# Patient Record
Sex: Male | Born: 1960 | Race: Black or African American
Health system: Southern US, Community
[De-identification: ages and names within clinical notes are randomized; demographics above are authoritative.]

## PROBLEM LIST (undated history)

## (undated) DIAGNOSIS — F329 Major depressive disorder, single episode, unspecified: Secondary | ICD-10-CM

## (undated) DIAGNOSIS — F209 Schizophrenia, unspecified: Secondary | ICD-10-CM

## (undated) DIAGNOSIS — F4310 Post-traumatic stress disorder, unspecified: Secondary | ICD-10-CM

## (undated) DIAGNOSIS — F319 Bipolar disorder, unspecified: Secondary | ICD-10-CM

## (undated) DIAGNOSIS — M1990 Unspecified osteoarthritis, unspecified site: Secondary | ICD-10-CM

## (undated) DIAGNOSIS — N481 Balanitis: Secondary | ICD-10-CM

## (undated) DIAGNOSIS — F32A Depression, unspecified: Secondary | ICD-10-CM

## (undated) HISTORY — DX: Unspecified osteoarthritis, unspecified site: M19.90

## (undated) HISTORY — DX: Major depressive disorder, single episode, unspecified: F32.9

## (undated) HISTORY — DX: Depression, unspecified: F32.A

---

## 1998-11-09 ENCOUNTER — Emergency Department (HOSPITAL_COMMUNITY): Admission: EM | Admit: 1998-11-09 | Discharge: 1998-11-09 | Payer: Self-pay

## 1998-11-12 ENCOUNTER — Emergency Department (HOSPITAL_COMMUNITY): Admission: EM | Admit: 1998-11-12 | Discharge: 1998-11-12 | Payer: Self-pay | Admitting: Emergency Medicine

## 1999-02-13 ENCOUNTER — Emergency Department (HOSPITAL_COMMUNITY): Admission: EM | Admit: 1999-02-13 | Discharge: 1999-02-13 | Payer: Self-pay | Admitting: Emergency Medicine

## 1999-04-29 ENCOUNTER — Emergency Department (HOSPITAL_COMMUNITY): Admission: EM | Admit: 1999-04-29 | Discharge: 1999-04-29 | Payer: Self-pay | Admitting: Emergency Medicine

## 1999-05-09 ENCOUNTER — Emergency Department (HOSPITAL_COMMUNITY): Admission: EM | Admit: 1999-05-09 | Discharge: 1999-05-09 | Payer: Self-pay | Admitting: Emergency Medicine

## 1999-05-09 ENCOUNTER — Inpatient Hospital Stay (HOSPITAL_COMMUNITY): Admission: AD | Admit: 1999-05-09 | Discharge: 1999-05-24 | Payer: Self-pay | Admitting: *Deleted

## 1999-05-23 ENCOUNTER — Encounter: Payer: Self-pay | Admitting: *Deleted

## 1999-05-23 ENCOUNTER — Ambulatory Visit (HOSPITAL_COMMUNITY): Admission: RE | Admit: 1999-05-23 | Discharge: 1999-05-23 | Payer: Self-pay | Admitting: *Deleted

## 1999-10-12 ENCOUNTER — Inpatient Hospital Stay (HOSPITAL_COMMUNITY): Admission: EM | Admit: 1999-10-12 | Discharge: 1999-10-17 | Payer: Self-pay | Admitting: Psychiatry

## 1999-10-27 ENCOUNTER — Inpatient Hospital Stay (HOSPITAL_COMMUNITY): Admission: AD | Admit: 1999-10-27 | Discharge: 1999-11-05 | Payer: Self-pay | Admitting: *Deleted

## 1999-11-14 ENCOUNTER — Emergency Department (HOSPITAL_COMMUNITY): Admission: EM | Admit: 1999-11-14 | Discharge: 1999-11-14 | Payer: Self-pay | Admitting: Emergency Medicine

## 2000-01-09 ENCOUNTER — Emergency Department (HOSPITAL_COMMUNITY): Admission: EM | Admit: 2000-01-09 | Discharge: 2000-01-09 | Payer: Self-pay | Admitting: Emergency Medicine

## 2000-02-01 ENCOUNTER — Inpatient Hospital Stay (HOSPITAL_COMMUNITY): Admission: EM | Admit: 2000-02-01 | Discharge: 2000-02-06 | Payer: Self-pay | Admitting: *Deleted

## 2000-02-07 ENCOUNTER — Emergency Department (HOSPITAL_COMMUNITY): Admission: EM | Admit: 2000-02-07 | Discharge: 2000-02-07 | Payer: Self-pay | Admitting: Emergency Medicine

## 2000-02-12 ENCOUNTER — Emergency Department (HOSPITAL_COMMUNITY): Admission: EM | Admit: 2000-02-12 | Discharge: 2000-02-12 | Payer: Self-pay | Admitting: Emergency Medicine

## 2000-02-13 ENCOUNTER — Encounter: Payer: Self-pay | Admitting: Emergency Medicine

## 2000-02-27 ENCOUNTER — Inpatient Hospital Stay (HOSPITAL_COMMUNITY): Admission: EM | Admit: 2000-02-27 | Discharge: 2000-03-06 | Payer: Self-pay | Admitting: *Deleted

## 2000-09-06 ENCOUNTER — Inpatient Hospital Stay (HOSPITAL_COMMUNITY): Admission: EM | Admit: 2000-09-06 | Discharge: 2000-09-11 | Payer: Self-pay | Admitting: *Deleted

## 2000-11-12 ENCOUNTER — Emergency Department (HOSPITAL_COMMUNITY): Admission: EM | Admit: 2000-11-12 | Discharge: 2000-11-12 | Payer: Self-pay | Admitting: Emergency Medicine

## 2001-08-11 ENCOUNTER — Inpatient Hospital Stay (HOSPITAL_COMMUNITY): Admission: EM | Admit: 2001-08-11 | Discharge: 2001-08-12 | Payer: Self-pay | Admitting: Psychiatry

## 2001-09-29 ENCOUNTER — Inpatient Hospital Stay (HOSPITAL_COMMUNITY): Admission: EM | Admit: 2001-09-29 | Discharge: 2001-10-15 | Payer: Self-pay | Admitting: Psychiatry

## 2001-10-10 ENCOUNTER — Encounter: Payer: Self-pay | Admitting: Psychiatry

## 2001-10-24 ENCOUNTER — Emergency Department (HOSPITAL_COMMUNITY): Admission: EM | Admit: 2001-10-24 | Discharge: 2001-10-24 | Payer: Self-pay | Admitting: Emergency Medicine

## 2001-12-11 ENCOUNTER — Emergency Department (HOSPITAL_COMMUNITY): Admission: EM | Admit: 2001-12-11 | Discharge: 2001-12-11 | Payer: Self-pay | Admitting: Emergency Medicine

## 2001-12-29 ENCOUNTER — Emergency Department (HOSPITAL_COMMUNITY): Admission: EM | Admit: 2001-12-29 | Discharge: 2001-12-29 | Payer: Self-pay | Admitting: Emergency Medicine

## 2002-02-24 ENCOUNTER — Inpatient Hospital Stay (HOSPITAL_COMMUNITY): Admission: EM | Admit: 2002-02-24 | Discharge: 2002-03-07 | Payer: Self-pay | Admitting: Psychiatry

## 2002-02-24 ENCOUNTER — Encounter: Payer: Self-pay | Admitting: Emergency Medicine

## 2002-03-27 ENCOUNTER — Encounter: Payer: Self-pay | Admitting: Emergency Medicine

## 2002-03-27 ENCOUNTER — Inpatient Hospital Stay (HOSPITAL_COMMUNITY): Admission: EM | Admit: 2002-03-27 | Discharge: 2002-03-31 | Payer: Self-pay | Admitting: Emergency Medicine

## 2002-05-09 ENCOUNTER — Inpatient Hospital Stay (HOSPITAL_COMMUNITY): Admission: EM | Admit: 2002-05-09 | Discharge: 2002-05-17 | Payer: Self-pay | Admitting: Psychiatry

## 2002-05-09 ENCOUNTER — Encounter: Payer: Self-pay | Admitting: Emergency Medicine

## 2002-05-14 ENCOUNTER — Encounter: Payer: Self-pay | Admitting: Psychiatry

## 2002-05-19 ENCOUNTER — Emergency Department (HOSPITAL_COMMUNITY): Admission: EM | Admit: 2002-05-19 | Discharge: 2002-05-19 | Payer: Self-pay | Admitting: Emergency Medicine

## 2002-06-25 ENCOUNTER — Inpatient Hospital Stay (HOSPITAL_COMMUNITY): Admission: AD | Admit: 2002-06-25 | Discharge: 2002-06-30 | Payer: Self-pay | Admitting: Psychiatry

## 2002-07-27 ENCOUNTER — Inpatient Hospital Stay (HOSPITAL_COMMUNITY): Admission: EM | Admit: 2002-07-27 | Discharge: 2002-08-09 | Payer: Self-pay | Admitting: *Deleted

## 2002-11-11 ENCOUNTER — Encounter: Payer: Self-pay | Admitting: *Deleted

## 2002-11-11 ENCOUNTER — Emergency Department (HOSPITAL_COMMUNITY): Admission: AD | Admit: 2002-11-11 | Discharge: 2002-11-11 | Payer: Self-pay | Admitting: Emergency Medicine

## 2002-11-27 ENCOUNTER — Inpatient Hospital Stay (HOSPITAL_COMMUNITY): Admission: EM | Admit: 2002-11-27 | Discharge: 2002-12-07 | Payer: Self-pay | Admitting: Psychiatry

## 2003-03-29 ENCOUNTER — Emergency Department (HOSPITAL_COMMUNITY): Admission: EM | Admit: 2003-03-29 | Discharge: 2003-03-29 | Payer: Self-pay | Admitting: *Deleted

## 2003-05-17 ENCOUNTER — Emergency Department (HOSPITAL_COMMUNITY): Admission: AD | Admit: 2003-05-17 | Discharge: 2003-05-17 | Payer: Self-pay | Admitting: Family Medicine

## 2003-06-09 ENCOUNTER — Inpatient Hospital Stay (HOSPITAL_COMMUNITY): Admission: AD | Admit: 2003-06-09 | Discharge: 2003-06-20 | Payer: Self-pay | Admitting: Psychiatry

## 2003-10-21 ENCOUNTER — Inpatient Hospital Stay (HOSPITAL_COMMUNITY): Admission: RE | Admit: 2003-10-21 | Discharge: 2003-10-25 | Payer: Self-pay | Admitting: Psychiatry

## 2004-02-07 ENCOUNTER — Ambulatory Visit: Payer: Self-pay | Admitting: Psychiatry

## 2004-02-07 ENCOUNTER — Inpatient Hospital Stay (HOSPITAL_COMMUNITY): Admission: RE | Admit: 2004-02-07 | Discharge: 2004-02-12 | Payer: Self-pay | Admitting: Psychiatry

## 2004-02-29 ENCOUNTER — Emergency Department (HOSPITAL_COMMUNITY): Admission: EM | Admit: 2004-02-29 | Discharge: 2004-02-29 | Payer: Self-pay | Admitting: Emergency Medicine

## 2004-03-28 ENCOUNTER — Inpatient Hospital Stay (HOSPITAL_COMMUNITY): Admission: RE | Admit: 2004-03-28 | Discharge: 2004-04-01 | Payer: Self-pay | Admitting: Psychiatry

## 2004-03-28 ENCOUNTER — Ambulatory Visit: Payer: Self-pay | Admitting: Psychiatry

## 2004-06-03 ENCOUNTER — Inpatient Hospital Stay (HOSPITAL_COMMUNITY): Admission: RE | Admit: 2004-06-03 | Discharge: 2004-06-12 | Payer: Self-pay | Admitting: Psychiatry

## 2004-06-03 ENCOUNTER — Ambulatory Visit: Payer: Self-pay | Admitting: Psychiatry

## 2004-08-07 ENCOUNTER — Inpatient Hospital Stay (HOSPITAL_COMMUNITY): Admission: RE | Admit: 2004-08-07 | Discharge: 2004-08-13 | Payer: Self-pay | Admitting: Psychiatry

## 2004-08-07 ENCOUNTER — Ambulatory Visit: Payer: Self-pay | Admitting: Psychiatry

## 2004-11-19 ENCOUNTER — Emergency Department (HOSPITAL_COMMUNITY): Admission: EM | Admit: 2004-11-19 | Discharge: 2004-11-19 | Payer: Self-pay | Admitting: Emergency Medicine

## 2004-12-11 ENCOUNTER — Inpatient Hospital Stay: Payer: Self-pay | Admitting: Psychiatry

## 2004-12-27 ENCOUNTER — Emergency Department: Payer: Self-pay | Admitting: Emergency Medicine

## 2005-02-27 ENCOUNTER — Inpatient Hospital Stay (HOSPITAL_COMMUNITY): Admission: RE | Admit: 2005-02-27 | Discharge: 2005-03-05 | Payer: Self-pay | Admitting: Psychiatry

## 2005-02-27 ENCOUNTER — Encounter: Payer: Self-pay | Admitting: Emergency Medicine

## 2005-02-27 ENCOUNTER — Ambulatory Visit: Payer: Self-pay | Admitting: Psychiatry

## 2005-07-26 ENCOUNTER — Inpatient Hospital Stay (HOSPITAL_COMMUNITY): Admission: RE | Admit: 2005-07-26 | Discharge: 2005-07-29 | Payer: Self-pay | Admitting: Psychiatry

## 2005-07-27 ENCOUNTER — Ambulatory Visit: Payer: Self-pay | Admitting: Psychiatry

## 2005-10-26 ENCOUNTER — Emergency Department (HOSPITAL_COMMUNITY): Admission: EM | Admit: 2005-10-26 | Discharge: 2005-10-27 | Payer: Self-pay | Admitting: Emergency Medicine

## 2006-05-07 ENCOUNTER — Emergency Department (HOSPITAL_COMMUNITY): Admission: EM | Admit: 2006-05-07 | Discharge: 2006-05-07 | Payer: Self-pay | Admitting: Emergency Medicine

## 2006-07-12 ENCOUNTER — Emergency Department (HOSPITAL_COMMUNITY): Admission: EM | Admit: 2006-07-12 | Discharge: 2006-07-12 | Payer: Self-pay | Admitting: Emergency Medicine

## 2006-07-25 ENCOUNTER — Emergency Department (HOSPITAL_COMMUNITY): Admission: EM | Admit: 2006-07-25 | Discharge: 2006-07-25 | Payer: Self-pay | Admitting: Emergency Medicine

## 2006-11-28 ENCOUNTER — Ambulatory Visit: Payer: Self-pay | Admitting: *Deleted

## 2006-11-28 ENCOUNTER — Inpatient Hospital Stay (HOSPITAL_COMMUNITY): Admission: AD | Admit: 2006-11-28 | Discharge: 2006-12-04 | Payer: Self-pay | Admitting: *Deleted

## 2007-03-28 ENCOUNTER — Inpatient Hospital Stay (HOSPITAL_COMMUNITY): Admission: AD | Admit: 2007-03-28 | Discharge: 2007-04-06 | Payer: Self-pay | Admitting: Psychiatry

## 2007-03-28 ENCOUNTER — Ambulatory Visit: Payer: Self-pay | Admitting: Psychiatry

## 2007-05-01 ENCOUNTER — Inpatient Hospital Stay (HOSPITAL_COMMUNITY): Admission: AD | Admit: 2007-05-01 | Discharge: 2007-05-10 | Payer: Self-pay | Admitting: Psychiatry

## 2007-07-27 ENCOUNTER — Emergency Department (HOSPITAL_COMMUNITY): Admission: EM | Admit: 2007-07-27 | Discharge: 2007-07-27 | Payer: Self-pay | Admitting: Emergency Medicine

## 2007-09-15 ENCOUNTER — Ambulatory Visit: Payer: Self-pay | Admitting: *Deleted

## 2007-09-15 ENCOUNTER — Inpatient Hospital Stay (HOSPITAL_COMMUNITY): Admission: AD | Admit: 2007-09-15 | Discharge: 2007-09-20 | Payer: Self-pay | Admitting: *Deleted

## 2007-09-15 ENCOUNTER — Other Ambulatory Visit: Payer: Self-pay | Admitting: Emergency Medicine

## 2007-09-25 ENCOUNTER — Other Ambulatory Visit: Payer: Self-pay | Admitting: Emergency Medicine

## 2007-09-25 ENCOUNTER — Inpatient Hospital Stay (HOSPITAL_COMMUNITY): Admission: AD | Admit: 2007-09-25 | Discharge: 2007-10-01 | Payer: Self-pay | Admitting: Psychiatry

## 2008-01-05 ENCOUNTER — Inpatient Hospital Stay (HOSPITAL_COMMUNITY): Admission: RE | Admit: 2008-01-05 | Discharge: 2008-01-13 | Payer: Self-pay | Admitting: Psychiatry

## 2008-01-05 ENCOUNTER — Ambulatory Visit: Payer: Self-pay | Admitting: Psychiatry

## 2008-01-05 ENCOUNTER — Encounter: Payer: Self-pay | Admitting: Emergency Medicine

## 2008-01-26 ENCOUNTER — Other Ambulatory Visit: Payer: Self-pay | Admitting: Emergency Medicine

## 2008-01-26 ENCOUNTER — Other Ambulatory Visit: Payer: Self-pay

## 2008-01-27 ENCOUNTER — Inpatient Hospital Stay (HOSPITAL_COMMUNITY): Admission: AD | Admit: 2008-01-27 | Discharge: 2008-02-02 | Payer: Self-pay | Admitting: Psychiatry

## 2008-02-05 ENCOUNTER — Other Ambulatory Visit: Payer: Self-pay | Admitting: Emergency Medicine

## 2008-02-05 ENCOUNTER — Inpatient Hospital Stay (HOSPITAL_COMMUNITY): Admission: RE | Admit: 2008-02-05 | Discharge: 2008-02-18 | Payer: Self-pay | Admitting: Psychiatry

## 2008-02-05 ENCOUNTER — Ambulatory Visit: Payer: Self-pay | Admitting: Psychiatry

## 2008-03-02 ENCOUNTER — Other Ambulatory Visit: Payer: Self-pay | Admitting: Emergency Medicine

## 2008-03-03 ENCOUNTER — Inpatient Hospital Stay (HOSPITAL_COMMUNITY): Admission: AD | Admit: 2008-03-03 | Discharge: 2008-03-10 | Payer: Self-pay | Admitting: Psychiatry

## 2008-03-26 ENCOUNTER — Emergency Department (HOSPITAL_COMMUNITY): Admission: EM | Admit: 2008-03-26 | Discharge: 2008-03-26 | Payer: Self-pay | Admitting: Emergency Medicine

## 2008-09-14 ENCOUNTER — Other Ambulatory Visit: Payer: Self-pay | Admitting: Emergency Medicine

## 2008-09-15 ENCOUNTER — Inpatient Hospital Stay (HOSPITAL_COMMUNITY): Admission: RE | Admit: 2008-09-15 | Discharge: 2008-09-22 | Payer: Self-pay | Admitting: *Deleted

## 2008-09-15 ENCOUNTER — Ambulatory Visit: Payer: Self-pay | Admitting: *Deleted

## 2008-10-08 ENCOUNTER — Other Ambulatory Visit: Payer: Self-pay | Admitting: Emergency Medicine

## 2008-10-09 ENCOUNTER — Inpatient Hospital Stay (HOSPITAL_COMMUNITY): Admission: AD | Admit: 2008-10-09 | Discharge: 2008-10-13 | Payer: Self-pay | Admitting: Psychiatry

## 2008-10-13 ENCOUNTER — Other Ambulatory Visit: Payer: Self-pay | Admitting: Emergency Medicine

## 2008-10-14 ENCOUNTER — Other Ambulatory Visit: Payer: Self-pay | Admitting: Emergency Medicine

## 2008-10-14 ENCOUNTER — Inpatient Hospital Stay (HOSPITAL_COMMUNITY): Admission: EM | Admit: 2008-10-14 | Discharge: 2008-10-24 | Payer: Self-pay | Admitting: Psychiatry

## 2008-10-14 ENCOUNTER — Ambulatory Visit: Payer: Self-pay | Admitting: Psychiatry

## 2008-11-13 ENCOUNTER — Other Ambulatory Visit: Payer: Self-pay | Admitting: Emergency Medicine

## 2008-11-13 ENCOUNTER — Inpatient Hospital Stay (HOSPITAL_COMMUNITY): Admission: AD | Admit: 2008-11-13 | Discharge: 2008-11-17 | Payer: Self-pay | Admitting: Psychiatry

## 2008-11-25 ENCOUNTER — Other Ambulatory Visit: Payer: Self-pay | Admitting: Emergency Medicine

## 2008-11-25 ENCOUNTER — Ambulatory Visit: Payer: Self-pay | Admitting: *Deleted

## 2008-11-25 ENCOUNTER — Inpatient Hospital Stay (HOSPITAL_COMMUNITY): Admission: AD | Admit: 2008-11-25 | Discharge: 2008-12-01 | Payer: Self-pay | Admitting: *Deleted

## 2008-12-06 ENCOUNTER — Inpatient Hospital Stay: Payer: Self-pay | Admitting: Psychiatry

## 2008-12-26 IMAGING — CR DG NASAL BONES 3+V
5 series · 5 of 5 positions shown · non-contrast
Comparison: No priors

CLINICAL DATA: Fell - nasal pain

 NASAL BONES - 3+ VIEW

[w waters *]
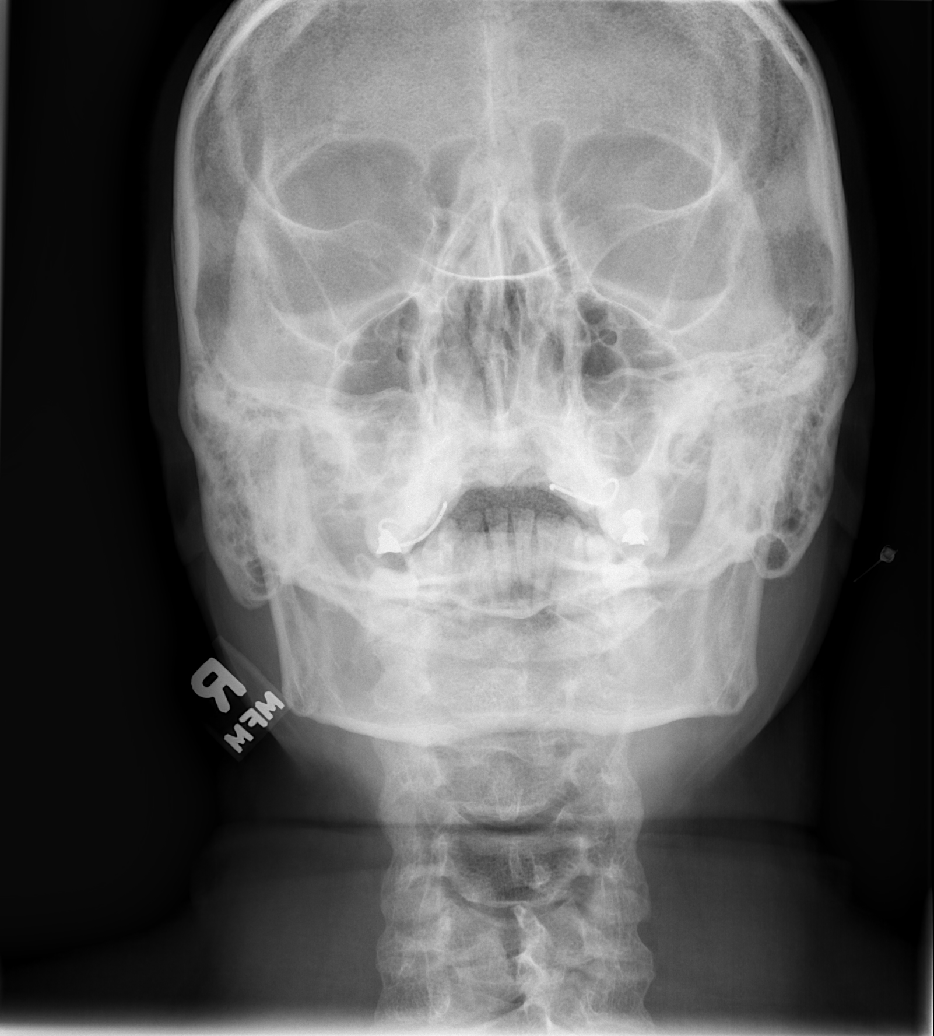

[w waters]
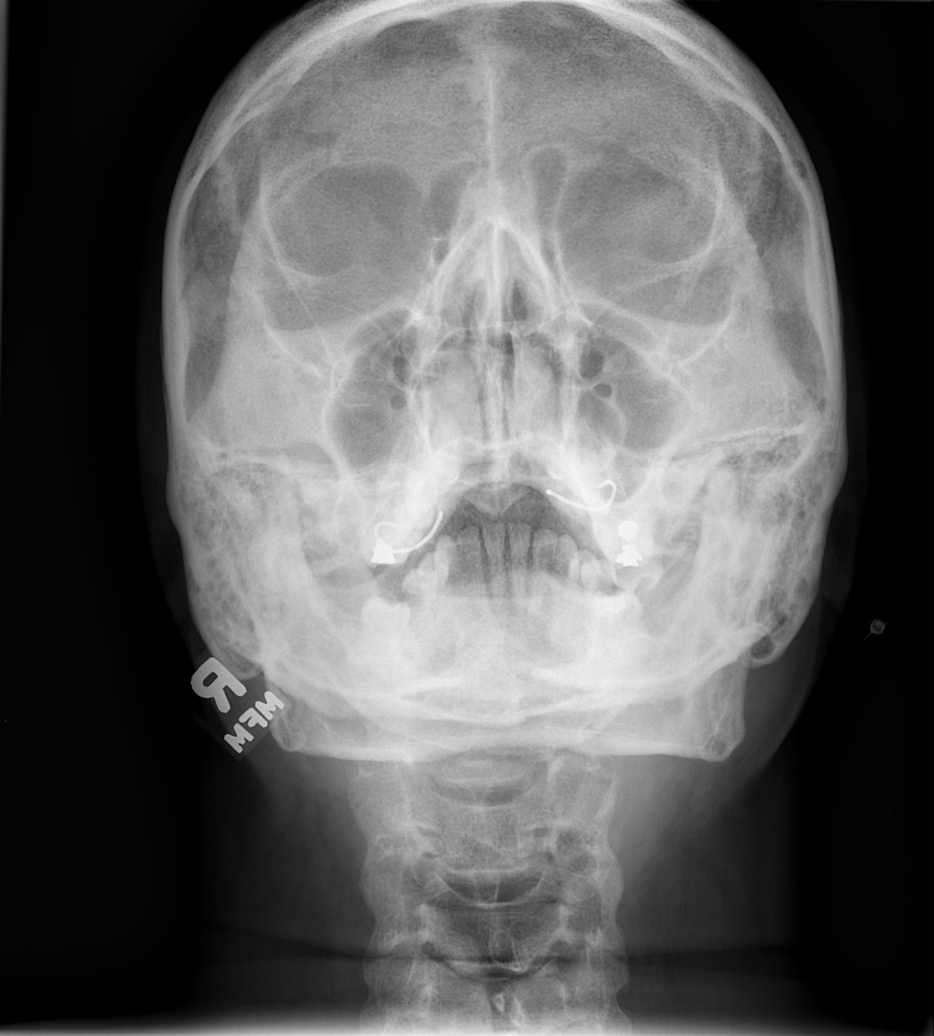

[w skull lat (1 of 3)]
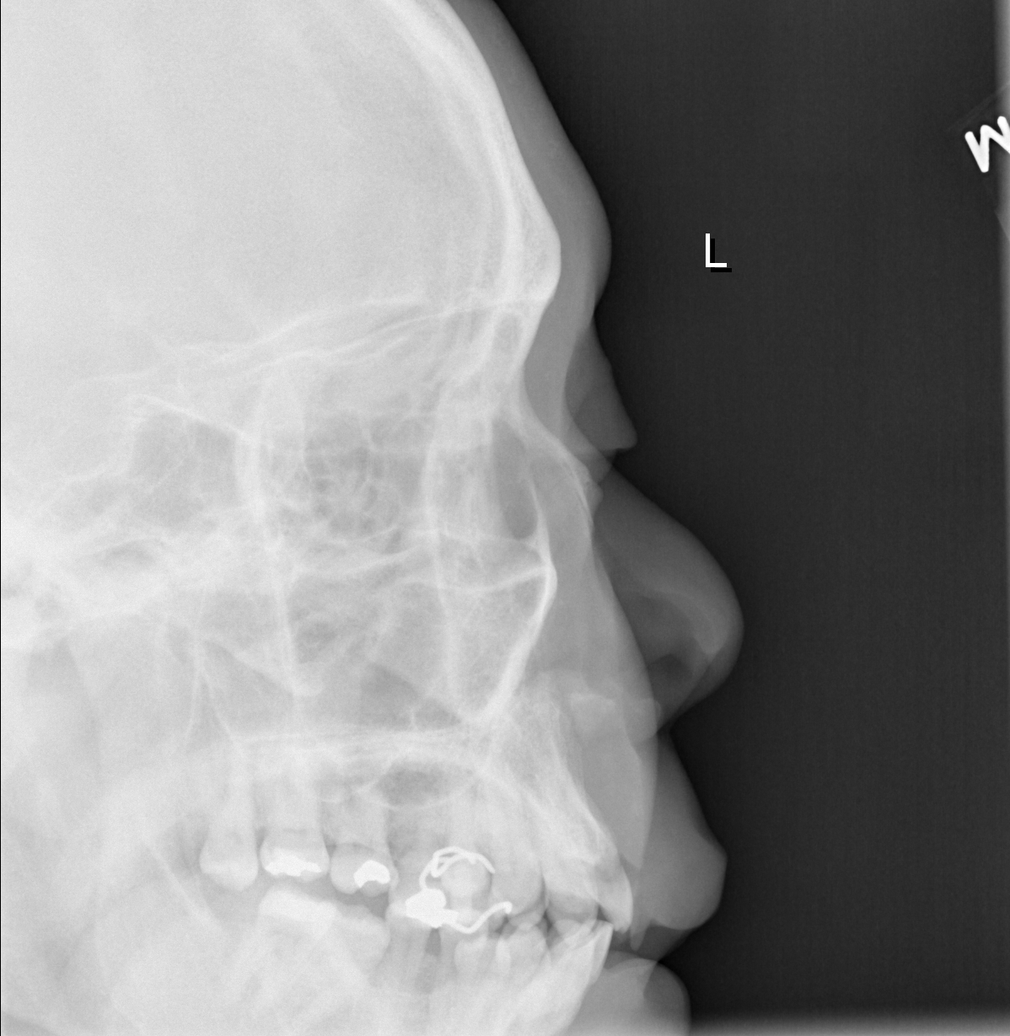

[w skull lat (2 of 3)]
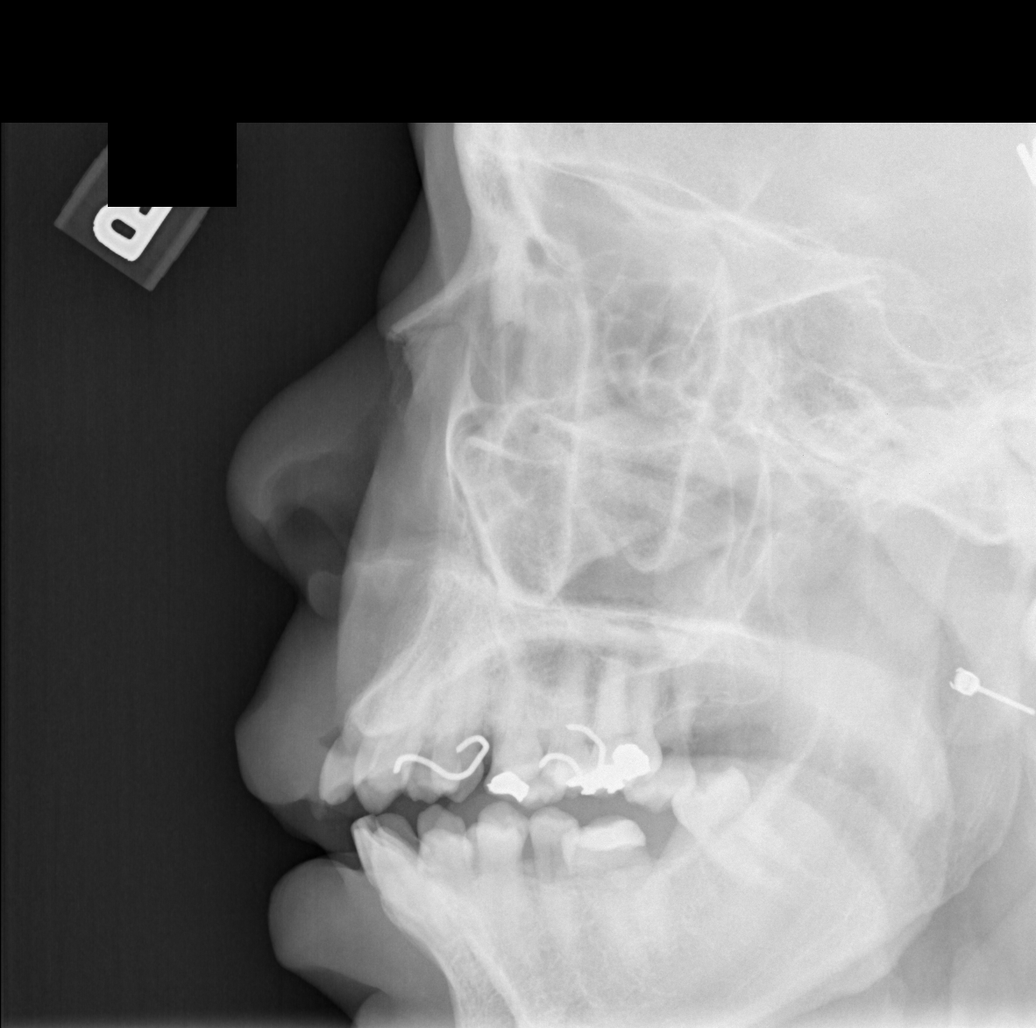

[w skull lat (3 of 3)]
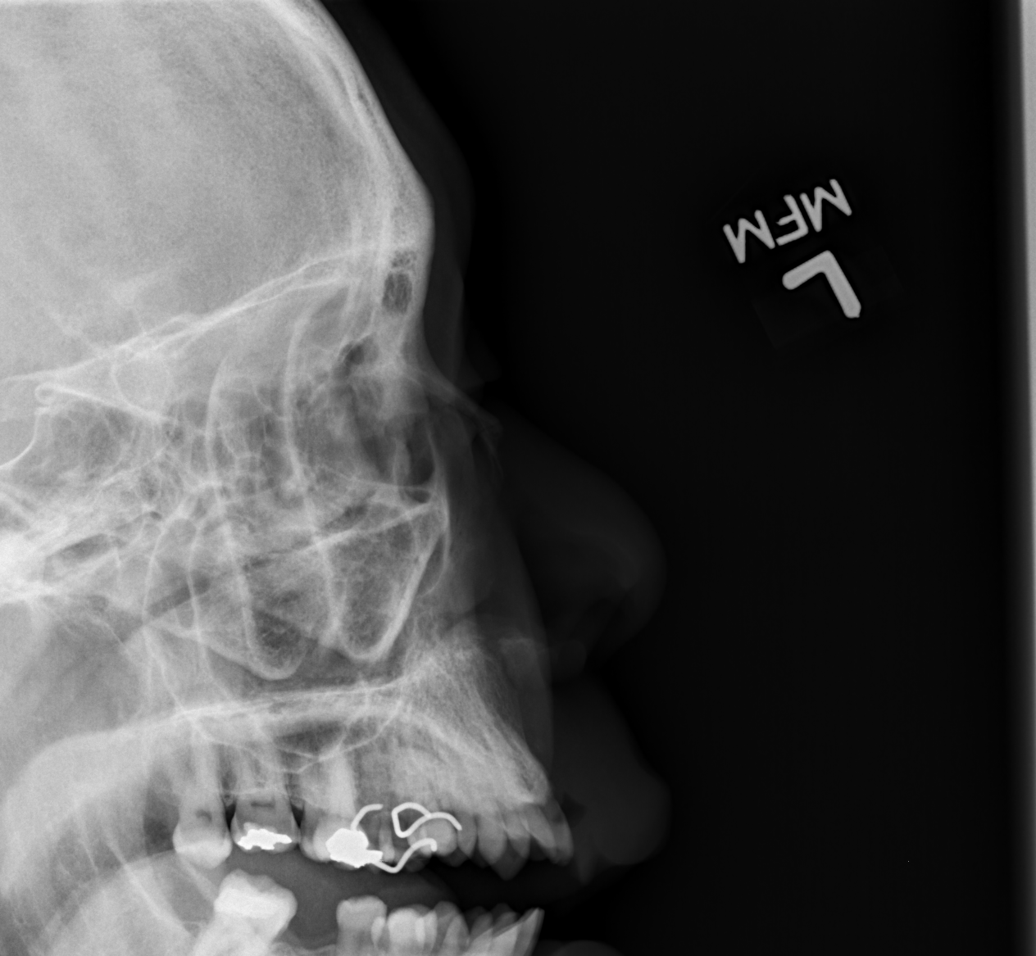

[5 of 5 positions shown; findings below may reference images not displayed]

FINDINGS: I cannot exclude an occult fracture of the distal nasal
bone.  This may be an artifact.  However the distal nasal bone
appears to be mildly displaced.  This could be due to an old
injury. No obvious fractures otherwise.  No fluid in the sinuses.
IMPRESSION: Cannot exclude a fracture of the distal nasal bone, but this may
not be acute.  See report.

## 2008-12-30 ENCOUNTER — Other Ambulatory Visit: Payer: Self-pay

## 2009-01-01 ENCOUNTER — Ambulatory Visit: Payer: Self-pay | Admitting: Psychiatry

## 2009-01-01 ENCOUNTER — Inpatient Hospital Stay (HOSPITAL_COMMUNITY): Admission: AD | Admit: 2009-01-01 | Discharge: 2009-01-05 | Payer: Self-pay | Admitting: Psychiatry

## 2009-01-07 ENCOUNTER — Other Ambulatory Visit: Payer: Self-pay

## 2009-01-07 ENCOUNTER — Other Ambulatory Visit: Payer: Self-pay | Admitting: Emergency Medicine

## 2009-01-08 ENCOUNTER — Emergency Department (HOSPITAL_COMMUNITY): Admission: EM | Admit: 2009-01-08 | Discharge: 2009-01-12 | Payer: Self-pay | Admitting: Emergency Medicine

## 2009-02-17 ENCOUNTER — Other Ambulatory Visit: Payer: Self-pay | Admitting: Emergency Medicine

## 2009-02-19 ENCOUNTER — Inpatient Hospital Stay (HOSPITAL_COMMUNITY): Admission: AD | Admit: 2009-02-19 | Discharge: 2009-02-23 | Payer: Self-pay | Admitting: Psychiatry

## 2009-02-19 ENCOUNTER — Ambulatory Visit: Payer: Self-pay | Admitting: Psychiatry

## 2009-03-03 ENCOUNTER — Other Ambulatory Visit: Payer: Self-pay

## 2009-03-03 ENCOUNTER — Emergency Department (HOSPITAL_COMMUNITY): Admission: EM | Admit: 2009-03-03 | Discharge: 2009-03-09 | Payer: Self-pay | Admitting: Emergency Medicine

## 2009-03-30 ENCOUNTER — Other Ambulatory Visit: Payer: Self-pay | Admitting: Emergency Medicine

## 2009-03-31 ENCOUNTER — Emergency Department (HOSPITAL_COMMUNITY): Admission: EM | Admit: 2009-03-31 | Discharge: 2009-04-09 | Payer: Self-pay | Admitting: Emergency Medicine

## 2010-03-15 ENCOUNTER — Other Ambulatory Visit: Payer: Self-pay | Admitting: Emergency Medicine

## 2010-03-18 ENCOUNTER — Ambulatory Visit: Payer: Self-pay | Admitting: Psychiatry

## 2010-03-18 ENCOUNTER — Inpatient Hospital Stay (HOSPITAL_COMMUNITY): Admission: AD | Admit: 2010-03-18 | Discharge: 2010-03-29 | Payer: Self-pay | Admitting: Psychiatry

## 2010-04-11 ENCOUNTER — Other Ambulatory Visit: Payer: Self-pay | Admitting: Emergency Medicine

## 2010-04-12 ENCOUNTER — Other Ambulatory Visit: Payer: Self-pay | Admitting: Emergency Medicine

## 2010-04-13 DIAGNOSIS — F259 Schizoaffective disorder, unspecified: Secondary | ICD-10-CM

## 2010-04-15 ENCOUNTER — Other Ambulatory Visit: Payer: Self-pay | Admitting: Emergency Medicine

## 2010-04-17 ENCOUNTER — Inpatient Hospital Stay (HOSPITAL_COMMUNITY): Admission: EM | Admit: 2010-04-17 | Discharge: 2010-04-24 | Payer: Self-pay | Admitting: Psychiatry

## 2010-04-17 DIAGNOSIS — F259 Schizoaffective disorder, unspecified: Secondary | ICD-10-CM

## 2010-05-31 ENCOUNTER — Emergency Department (HOSPITAL_COMMUNITY)
Admission: EM | Admit: 2010-05-31 | Discharge: 2010-05-31 | Disposition: A | Discharge status: Other Acute Inpatient Hospital | Payer: Self-pay | Source: Home / Self Care | Admitting: Emergency Medicine

## 2010-05-31 ENCOUNTER — Inpatient Hospital Stay (HOSPITAL_COMMUNITY)
Admission: AD | Admit: 2010-05-31 | Discharge: 2010-06-12 | Payer: Self-pay | Source: Home / Self Care | Attending: Psychiatry | Admitting: Psychiatry

## 2010-05-31 LAB — CBC
HCT: 45.8 % (ref 39.0–52.0)
Hemoglobin: 15 g/dL (ref 13.0–17.0)
MCH: 30.3 pg (ref 26.0–34.0)
MCHC: 32.8 g/dL (ref 30.0–36.0)
MCV: 92.5 fL (ref 78.0–100.0)
Platelets: 221 10*3/uL (ref 150–400)
RBC: 4.95 MIL/uL (ref 4.22–5.81)
RDW: 13 % (ref 11.5–15.5)
WBC: 4.4 10*3/uL (ref 4.0–10.5)

## 2010-05-31 LAB — DIFFERENTIAL
Basophils Absolute: 0 10*3/uL (ref 0.0–0.1)
Basophils Relative: 0 % (ref 0–1)
Eosinophils Absolute: 0.3 10*3/uL (ref 0.0–0.7)
Eosinophils Relative: 8 % — ABNORMAL HIGH (ref 0–5)
Lymphocytes Relative: 23 % (ref 12–46)
Lymphs Abs: 1 10*3/uL (ref 0.7–4.0)
Monocytes Absolute: 0.4 10*3/uL (ref 0.1–1.0)
Monocytes Relative: 10 % (ref 3–12)
Neutro Abs: 2.6 10*3/uL (ref 1.7–7.7)
Neutrophils Relative %: 59 % (ref 43–77)

## 2010-05-31 LAB — BASIC METABOLIC PANEL
BUN: 18 mg/dL (ref 6–23)
CO2: 27 mEq/L (ref 19–32)
Calcium: 9.1 mg/dL (ref 8.4–10.5)
Chloride: 105 mEq/L (ref 96–112)
Creatinine, Ser: 1.07 mg/dL (ref 0.4–1.5)
GFR calc Af Amer: >60 mL/min (ref >60)
GFR calc non Af Amer: >60 mL/min (ref >60)
Glucose, Bld: 89 mg/dL (ref 70–99)
Potassium: 3.9 mEq/L (ref 3.5–5.1)
Sodium: 141 mEq/L (ref 135–145)

## 2010-05-31 LAB — RAPID URINE DRUG SCREEN, HOSP PERFORMED
Amphetamines: NOT DETECTED
Barbiturates: NOT DETECTED
Benzodiazepines: NOT DETECTED
Cocaine: POSITIVE — AB
Opiates: NOT DETECTED
Tetrahydrocannabinol: NOT DETECTED

## 2010-05-31 LAB — LITHIUM LEVEL: Lithium Lvl: <0.25 mEq/L — ABNORMAL LOW (ref 0.80–1.40)

## 2010-05-31 LAB — ETHANOL: Alcohol, Ethyl (B): <5 mg/dL (ref 0–10)

## 2010-05-31 LAB — SALICYLATE LEVEL: Salicylate Lvl: <4 mg/dL (ref 2.8–20.0)

## 2010-05-31 LAB — ACETAMINOPHEN LEVEL: Acetaminophen (Tylenol), Serum: <10 ug/mL — ABNORMAL LOW (ref 10–30)

## 2010-06-10 LAB — LITHIUM LEVEL: Lithium Lvl: <0.25 mEq/L — ABNORMAL LOW (ref 0.80–1.40)
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
## 2010-08-06 LAB — DIFFERENTIAL
Basophils Absolute: 0 10*3/uL (ref 0.0–0.1)
Basophils Relative: 1 % (ref 0–1)
Eosinophils Absolute: 0.3 10*3/uL (ref 0.0–0.7)
Eosinophils Relative: 8 % — ABNORMAL HIGH (ref 0–5)
Lymphocytes Relative: 17 % (ref 12–46)
Lymphs Abs: 0.7 10*3/uL (ref 0.7–4.0)
Monocytes Absolute: 0.4 10*3/uL (ref 0.1–1.0)
Monocytes Relative: 9 % (ref 3–12)
Neutro Abs: 2.7 10*3/uL (ref 1.7–7.7)
Neutrophils Relative %: 66 % (ref 43–77)

## 2010-08-06 LAB — BASIC METABOLIC PANEL
BUN: 11 mg/dL (ref 6–23)
CO2: 28 mEq/L (ref 19–32)
Calcium: 9.2 mg/dL (ref 8.4–10.5)
Chloride: 105 mEq/L (ref 96–112)
Creatinine, Ser: 1.04 mg/dL (ref 0.4–1.5)
GFR calc Af Amer: >60 mL/min (ref >60)
GFR calc non Af Amer: >60 mL/min (ref >60)
Glucose, Bld: 101 mg/dL — ABNORMAL HIGH (ref 70–99)
Potassium: 3.6 mEq/L (ref 3.5–5.1)
Sodium: 143 mEq/L (ref 135–145)

## 2010-08-06 LAB — CBC
HCT: 43.8 % (ref 39.0–52.0)
Hemoglobin: 14.6 g/dL (ref 13.0–17.0)
MCH: 30 pg (ref 26.0–34.0)
MCHC: 33.4 g/dL (ref 30.0–36.0)
MCV: 90 fL (ref 78.0–100.0)
Platelets: 156 10*3/uL (ref 150–400)
RBC: 4.86 MIL/uL (ref 4.22–5.81)
RDW: 13.9 % (ref 11.5–15.5)
WBC: 4.1 10*3/uL (ref 4.0–10.5)

## 2010-08-06 LAB — ETHANOL: Alcohol, Ethyl (B): <5 mg/dL (ref 0–10)

## 2010-08-06 LAB — RAPID URINE DRUG SCREEN, HOSP PERFORMED
Amphetamines: NOT DETECTED
Barbiturates: NOT DETECTED
Benzodiazepines: NOT DETECTED
Cocaine: POSITIVE — AB
Opiates: NOT DETECTED
Tetrahydrocannabinol: NOT DETECTED

## 2010-08-06 LAB — TRICYCLICS SCREEN, URINE: TCA Scrn: NOT DETECTED

## 2010-08-07 LAB — BASIC METABOLIC PANEL
CO2: 29 mEq/L (ref 19–32)
Calcium: 9.2 mg/dL (ref 8.4–10.5)
Chloride: 106 mEq/L (ref 96–112)
Creatinine, Ser: 1.05 mg/dL (ref 0.4–1.5)
GFR calc Af Amer: >60 mL/min (ref >60)
GFR calc Af Amer: >60 mL/min (ref >60)
GFR calc non Af Amer: >60 mL/min (ref >60)
GFR calc non Af Amer: >60 mL/min (ref >60)
Glucose, Bld: 84 mg/dL (ref 70–99)
Potassium: 3.9 mEq/L (ref 3.5–5.1)
Sodium: 138 mEq/L (ref 135–145)
Sodium: 141 mEq/L (ref 135–145)

## 2010-08-07 LAB — TSH
TSH: 1.995 u[IU]/mL (ref 0.350–4.500)
TSH: 2.365 u[IU]/mL (ref 0.350–4.500)

## 2010-08-07 LAB — LITHIUM LEVEL
Lithium Lvl: 0.29 mEq/L — ABNORMAL LOW (ref 0.80–1.40)
Lithium Lvl: 0.54 mEq/L — ABNORMAL LOW (ref 0.80–1.40)
Lithium Lvl: 0.58 mEq/L — ABNORMAL LOW (ref 0.80–1.40)
Lithium Lvl: <0.25 mEq/L — ABNORMAL LOW (ref 0.80–1.40)

## 2010-08-07 LAB — RAPID URINE DRUG SCREEN, HOSP PERFORMED
Amphetamines: NOT DETECTED
Cocaine: NOT DETECTED
Tetrahydrocannabinol: NOT DETECTED

## 2010-08-07 LAB — TRICYCLICS SCREEN, URINE: TCA Scrn: NOT DETECTED

## 2010-08-07 LAB — DIFFERENTIAL
Basophils Absolute: 0 10*3/uL (ref 0.0–0.1)
Basophils Relative: 1 % (ref 0–1)
Eosinophils Absolute: 0.6 10*3/uL (ref 0.0–0.7)
Monocytes Relative: 8 % (ref 3–12)
Neutro Abs: 3.3 10*3/uL (ref 1.7–7.7)
Neutrophils Relative %: 60 % (ref 43–77)

## 2010-08-07 LAB — HEPATIC FUNCTION PANEL
Albumin: 3.9 g/dL (ref 3.5–5.2)
Alkaline Phosphatase: 62 U/L (ref 39–117)
Indirect Bilirubin: 0.6 mg/dL (ref 0.3–0.9)
Total Protein: 7.6 g/dL (ref 6.0–8.3)

## 2010-08-07 LAB — SALICYLATE LEVEL: Salicylate Lvl: <4 mg/dL (ref 2.8–20.0)

## 2010-08-07 LAB — CBC
Hemoglobin: 14.2 g/dL (ref 13.0–17.0)
MCH: 29.2 pg (ref 26.0–34.0)
MCHC: 32.6 g/dL (ref 30.0–36.0)
Platelets: 205 10*3/uL (ref 150–400)

## 2010-08-07 LAB — ACETAMINOPHEN LEVEL: Acetaminophen (Tylenol), Serum: <10 ug/mL — ABNORMAL LOW (ref 10–30)

## 2010-08-09 ENCOUNTER — Emergency Department (HOSPITAL_COMMUNITY)
Admission: EM | Admit: 2010-08-09 | Discharge: 2010-08-10 | Disposition: A | Discharge status: Other Acute Inpatient Hospital | Payer: Medicaid Other | Source: Home / Self Care | Attending: Emergency Medicine | Admitting: Emergency Medicine

## 2010-08-09 DIAGNOSIS — F319 Bipolar disorder, unspecified: Secondary | ICD-10-CM | POA: Insufficient documentation

## 2010-08-09 DIAGNOSIS — Z79899 Other long term (current) drug therapy: Secondary | ICD-10-CM | POA: Insufficient documentation

## 2010-08-09 DIAGNOSIS — Z046 Encounter for general psychiatric examination, requested by authority: Secondary | ICD-10-CM | POA: Insufficient documentation

## 2010-08-09 LAB — DIFFERENTIAL
Basophils Absolute: 0 10*3/uL (ref 0.0–0.1)
Basophils Relative: 1 % (ref 0–1)
Eosinophils Absolute: 0.4 10*3/uL (ref 0.0–0.7)
Monocytes Relative: 8 % (ref 3–12)
Neutro Abs: 2.1 10*3/uL (ref 1.7–7.7)
Neutrophils Relative %: 57 % (ref 43–77)

## 2010-08-09 LAB — ETHANOL: Alcohol, Ethyl (B): <5 mg/dL (ref 0–10)

## 2010-08-09 LAB — COMPREHENSIVE METABOLIC PANEL
ALT: 24 U/L (ref 0–53)
Alkaline Phosphatase: 74 U/L (ref 39–117)
BUN: 13 mg/dL (ref 6–23)
CO2: 27 mEq/L (ref 19–32)
Calcium: 9.4 mg/dL (ref 8.4–10.5)
GFR calc non Af Amer: >60 mL/min (ref >60)
Glucose, Bld: 99 mg/dL (ref 70–99)
Potassium: 4.2 mEq/L (ref 3.5–5.1)
Sodium: 140 mEq/L (ref 135–145)

## 2010-08-09 LAB — RAPID URINE DRUG SCREEN, HOSP PERFORMED
Barbiturates: NOT DETECTED
Benzodiazepines: NOT DETECTED
Opiates: NOT DETECTED

## 2010-08-09 LAB — CBC
Hemoglobin: 15.1 g/dL (ref 13.0–17.0)
MCH: 29.2 pg (ref 26.0–34.0)
Platelets: 199 10*3/uL (ref 150–400)
RBC: 5.18 MIL/uL (ref 4.22–5.81)

## 2010-08-09 LAB — LITHIUM LEVEL: Lithium Lvl: <0.25 mEq/L — ABNORMAL LOW (ref 0.80–1.40)

## 2010-08-10 ENCOUNTER — Inpatient Hospital Stay (HOSPITAL_COMMUNITY)
Admission: RE | Admit: 2010-08-10 | Discharge: 2010-08-14 | DRG: 897 | Disposition: A | Discharge status: Home / Self Care | Payer: Medicaid Other | Source: Ambulatory Visit | Attending: Psychiatry | Admitting: Psychiatry

## 2010-08-10 DIAGNOSIS — F1410 Cocaine abuse, uncomplicated: Secondary | ICD-10-CM

## 2010-08-10 DIAGNOSIS — Z91199 Patient's noncompliance with other medical treatment and regimen due to unspecified reason: Secondary | ICD-10-CM

## 2010-08-10 DIAGNOSIS — F319 Bipolar disorder, unspecified: Secondary | ICD-10-CM

## 2010-08-10 DIAGNOSIS — Z9119 Patient's noncompliance with other medical treatment and regimen: Secondary | ICD-10-CM

## 2010-08-10 DIAGNOSIS — F39 Unspecified mood [affective] disorder: Secondary | ICD-10-CM

## 2010-08-10 DIAGNOSIS — F259 Schizoaffective disorder, unspecified: Secondary | ICD-10-CM

## 2010-08-10 DIAGNOSIS — R45851 Suicidal ideations: Secondary | ICD-10-CM

## 2010-08-10 DIAGNOSIS — F1994 Other psychoactive substance use, unspecified with psychoactive substance-induced mood disorder: Secondary | ICD-10-CM

## 2010-08-10 DIAGNOSIS — F1020 Alcohol dependence, uncomplicated: Principal | ICD-10-CM

## 2010-08-11 DIAGNOSIS — F1010 Alcohol abuse, uncomplicated: Secondary | ICD-10-CM

## 2010-08-11 DIAGNOSIS — F1994 Other psychoactive substance use, unspecified with psychoactive substance-induced mood disorder: Secondary | ICD-10-CM

## 2010-08-11 DIAGNOSIS — F1420 Cocaine dependence, uncomplicated: Secondary | ICD-10-CM

## 2010-08-15 NOTE — Discharge Summary (Signed)
NAMEOSBALDO, MARK               ACCOUNT NO.:  [id number]  MEDICAL RECORD NO.:  [id number]           PATIENT TYPE:  I  LOCATION:  0306                          FACILITY:  BH  PHYSICIAN:  Anselm Jungling, MD  DATE OF BIRTH:  10/12/1960  DATE OF ADMISSION:  08/10/2010 DATE OF DISCHARGE:  08/14/2010                              DISCHARGE SUMMARY   IDENTIFYING DATA AND REASON FOR ADMISSION:  This is the 39th psychiatric inpatient hospitalization at the [facility] for [place], a 50 year old single African American male, who presented to [facility] emergency department.  He reported that he had been noncompliant with his outpatient medication because his girlfriend had thrown them away; however, he had not gone for refills or to [facility], and he had also relapsed on alcohol.  He reported that he was feeling like hurting himself.  Please refer to the admission note for further details pertaining to the symptoms, circumstances and history that led to his hospitalization.  He was given an initial Axis I diagnosis of alcohol dependence and substance-induced mood disorder.  MEDICAL AND LABORATORY:  The patient was medically cleared in the [facility] emergency department.  He is in generally good health without any active or chronic medical problems.  There were no significant medical issues during his stay.  HOSPITAL COURSE:  The patient was admitted to the adult inpatient psychiatric service.  He presented as a tall, healthy-appearing, adequately nourished adult male with depressed mood and sad affect.  He was nonpsychotic and although he acknowledged suicidal ideation prior to admission did not indicate any plan or intent.  He was placed on a Librium withdrawal protocol for alcohol cessation. His detoxification was uneventful.  Despite repeated encouragement to attend therapeutic groups and activities, his attendance was very poor. When he did  attend, he tended to be withdrawn, quiet, and inattentive.  He was highly invested in having [place] find for him some form of residential treatment program, but he was completely passive with regards to taking any initiative on his own to develop or clarify any form of aftercare plan otherwise.  When case management proposed that he try to live in an [facility], which was clearly his best option, he declined this assistance.  He had also declined assistance with going to a shelter.  He indicated interest in going to a 14-day inpatient program, but he had been in many such programs in the past, and he appeared to be a poor candidate for such referral.  Furthermore, he was not able to articulate any plan whatsoever regarding what he would do once those 14 days were up.  By the fourth hospital day, it was clear that the patient had maximized the potential benefits of his stay.  Furthermore, he was not participating, either in therapeutic groups or activities, nor did he appear to be willing to work on a good faith basis with case management in aftercare planning efforts.  He was discharged on the fourth hospital day on the following medication regimen:  Lithium carbonate 600 mg b.i.d.  DISCHARGE DIAGNOSES:  AXIS I:  Alcohol dependence,  chronic, severe. History of bipolar disorder NOS. AXIS II:  Deferred. AXIS III:  No acute or chronic illnesses. AXIS IV:  Stressors severe. AXIS V:  GAF on discharge 50.  The suicide risk assessment was completed at the time of discharge and the patient was felt to be at minimal risk.  It is further noted by the undersigned that the patient has had over the past 10 years 39 different admissions to this psychiatric inpatient facility.  In the past calendar year, he has had 13 admissions.  This admission presently was his third in the past 10 weeks.  This does not include numerous emergency room visits that have occurred in the past 10 years.  In addition,  he has been hospitalized numerous times at other psychiatric inpatient facilities in the region.  He appears to have maximized his benefit from inpatient services here at this facility.  His ongoing difficulties appear to be related to his unwillingness to invest himself in aftercare opportunities that are arranged for him.  This is a pattern that has emerged and become very clear over time.  Indeed, in this particular admission, he was not even willing to engage effectively with case management towards developing a reasonable aftercare plan at this time.  The patient indicated that he would be going to the [facility] area, where he indicated that he had some acquaintance of some sort that he could stay with.  He also indicated that he might explore getting services through [facility].  However, he was very vague and evasive regarding all of this.  It is recommended that if he presents to the [facility] emergency department indicating a need or desire for inpatient psychiatric services for chemical dependency or psychiatric issues that he be transferred to [facility] for longer-term treatment that can better address his chronic needs.     Anselm Jungling, MD     SPB/MEDQ  D:  08/14/2010  T:  08/14/2010  Job:  [id number]  Electronically Signed by Geralyn Flash MD on 08/15/2010 11:41:52 AM

## 2010-08-18 ENCOUNTER — Emergency Department (HOSPITAL_COMMUNITY)
Admission: EM | Admit: 2010-08-18 | Discharge: 2010-08-23 | Disposition: A | Discharge status: Home / Self Care | Payer: Medicaid Other | Attending: Emergency Medicine | Admitting: Emergency Medicine

## 2010-08-18 DIAGNOSIS — F1910 Other psychoactive substance abuse, uncomplicated: Secondary | ICD-10-CM | POA: Insufficient documentation

## 2010-08-18 DIAGNOSIS — R45851 Suicidal ideations: Secondary | ICD-10-CM | POA: Insufficient documentation

## 2010-08-18 DIAGNOSIS — F3130 Bipolar disorder, current episode depressed, mild or moderate severity, unspecified: Secondary | ICD-10-CM | POA: Insufficient documentation

## 2010-08-18 DIAGNOSIS — Z79899 Other long term (current) drug therapy: Secondary | ICD-10-CM | POA: Insufficient documentation

## 2010-08-18 LAB — COMPREHENSIVE METABOLIC PANEL
ALT: 26 U/L (ref 0–53)
AST: 28 U/L (ref 0–37)
Albumin: 3.9 g/dL (ref 3.5–5.2)
Calcium: 9.3 mg/dL (ref 8.4–10.5)
GFR calc Af Amer: >60 mL/min (ref >60)
Glucose, Bld: 114 mg/dL — ABNORMAL HIGH (ref 70–99)
Sodium: 139 mEq/L (ref 135–145)
Total Protein: 7 g/dL (ref 6.0–8.3)

## 2010-08-18 LAB — CBC
MCHC: 32.1 g/dL (ref 30.0–36.0)
Platelets: 186 10*3/uL (ref 150–400)
RDW: 13.3 % (ref 11.5–15.5)
WBC: 5.3 10*3/uL (ref 4.0–10.5)

## 2010-08-18 LAB — RAPID URINE DRUG SCREEN, HOSP PERFORMED
Amphetamines: NOT DETECTED
Benzodiazepines: POSITIVE — AB
Cocaine: POSITIVE — AB

## 2010-08-18 LAB — ETHANOL: Alcohol, Ethyl (B): <5 mg/dL (ref 0–10)

## 2010-08-18 LAB — DIFFERENTIAL
Basophils Absolute: 0 10*3/uL (ref 0.0–0.1)
Basophils Relative: 1 % (ref 0–1)
Eosinophils Relative: 10 % — ABNORMAL HIGH (ref 0–5)
Monocytes Absolute: 0.6 10*3/uL (ref 0.1–1.0)

## 2010-08-21 NOTE — H&P (Signed)
[name], [name]               ACCOUNT NO.:  [id number]  MEDICAL RECORD NO.:  [id number]           PATIENT TYPE:  LOCATION:                                 FACILITY:  PHYSICIAN:  Perry Jungling, MD  DATE OF BIRTH:  [birth year]  DATE OF ADMISSION: DATE OF DISCHARGE:                      PSYCHIATRIC ADMISSION ASSESSMENT   This is a voluntary admission to the services of Dr. Geralyn [name]. This is a 50 year old, single, African American male.  He presented once again to the [facility] ED.  He reported that he was noncompliant this time because his girlfriend threw them away 2 weeks ago.  Of course, he did not call for refills or go to [facility] and he has relapsed drinking and reports that he feels like hurting himself. He is also positive for cocaine again.  This is [name] probable 40th admission.  He states that since his girlfriend threw away his medicine he has noticed an increase in depression and thoughts of hurting himself.  He would like a behavioral evaluation.  He stated that he would like to cut himself and the onset was gradual.  The symptoms have been associated with anhedonia, depression, drug abuse, excessive alcohol consumption, feeling hopeless, suicidal ideation, and suicidal plans while the symptoms have not been associated with hallucinations, homicidal ideation, or suicide attempts.  Now, when he saw Dr. Huston [name] he changes his story.  Now he is suicidal and homicidal towards his mother. He readily admits noncompliance with his psychotropic medications.  He was reporting an increase in perceptual symptoms, voices, but they are not command in nature and basically they are just voicing that he is hopeless and he states that his last use of cocaine was on August 08, 2010.  PAST PSYCHIATRIC HISTORY:  He was most recently here May 31, 2010, to June 12, 2010.  SOCIAL HISTORY:  Has never married.  He has no children.  He is a  high school graduate in 1979.  He is attempting to get disability for his "mental illness."  FAMILY HISTORY:  None.  ALCOHOL AND DRUG HISTORY:  He has been abusive of alcohol and cocaine for many years.  PRIMARY CARE PROVIDER:  None.  He reports that he follows up at [facility].  MEDICATIONS:  When he was discharged at the end of January, he was on: 1. Lithium carbonate 300 mg two b.i.d. 2. Hydroxyzine 150 mg at h.s.  He states that these medications work well for him; he just has to take them.  He has no known drug allergies.  POSITIVE PHYSICAL FINDINGS:  He was medically cleared in the ED.  He was afebrile.  His temperature ranged from 98 to 99.  His pulse ranged from 66 to 82 and respirations were 18 to 20.  Blood pressure was 106/62 up to 118/73.  LABS:  Confirmed that he was positive for cocaine.  He had no measurable alcohol.  His lithium level was 0.25, the normal is 0.80 to 1.40.  WBC had no worrisome findings and he had no abnormalities at all of his BMET including no abnormalities of SGOT or  SGPT.  MENTAL STATUS EXAM:  He was seen in his room in bed.  He states he is not feeling well today.  He was casually dressed in hospital scrubs.  He appeared to be adequately groomed and nourished.  His mood is depressed. His affect is congruent.  His thought processes are somewhat clear, rational, and goal oriented.  He can tell me that the medications that work best for him are the lithium and the Vistaril.  Judgment and insight are fair.  Concentration and memory are intact.  Intelligence is at least average.  He denies being suicidal or homicidal and he is reporting thoughts not really hallucinations.  DIAGNOSES:  AXIS I: 1. Relapse on alcohol and cocaine. 2. Substance-induced mood disorder. 3. Mood disorder, not otherwise specified. AXIS II:  Deferred. AXIS III:  None known. AXIS IV:  Severe.  He has issues with occupation and housing  and economics which leads to conflict with his mother. AXIS V:  20.  PLAN:  Admit for safety and stabilization.  He will be put on the [facility] Substance Abuse Unit.  He needs residential substance abuse treatment and I am not going to start Zyprexa as he really does not need it.  ESTIMATED LENGTH OF STAY:  Three to 5 days.     [name], P.A.-C.   ______________________________ Perry Jungling, MD    MD/MEDQ  D:  08/11/2010  T:  08/11/2010  Job:  [id number]  Electronically Signed by [name] P.A.-C. on 08/20/2010 07:27:25 PM Electronically Signed by Perry Flash MD on 08/21/2010 07:44:14 AM

## 2010-08-22 DIAGNOSIS — F3112 Bipolar disorder, current episode manic without psychotic features, moderate: Secondary | ICD-10-CM

## 2010-08-28 LAB — CBC
HCT: 44.3 % (ref 39.0–52.0)
MCHC: 33.5 g/dL (ref 30.0–36.0)
MCV: 92.1 fL (ref 78.0–100.0)
Platelets: 194 10*3/uL (ref 150–400)
RDW: 12.8 % (ref 11.5–15.5)

## 2010-08-28 LAB — RAPID URINE DRUG SCREEN, HOSP PERFORMED
Amphetamines: NOT DETECTED
Barbiturates: NOT DETECTED
Benzodiazepines: NOT DETECTED
Cocaine: POSITIVE — AB
Opiates: NOT DETECTED

## 2010-08-28 LAB — BASIC METABOLIC PANEL
BUN: 9 mg/dL (ref 6–23)
BUN: 9 mg/dL (ref 6–23)
BUN: 14 mg/dL (ref 6–23)
CO2: 29 mEq/L (ref 19–32)
CO2: 26 mEq/L (ref 19–32)
Calcium: 9.1 mg/dL (ref 8.4–10.5)
Calcium: 9.3 mg/dL (ref 8.4–10.5)
Calcium: 9.3 mg/dL (ref 8.4–10.5)
Chloride: 105 mEq/L (ref 96–112)
Creatinine, Ser: 1.15 mg/dL (ref 0.4–1.5)
Creatinine, Ser: 1.3 mg/dL (ref 0.4–1.5)
Creatinine, Ser: 0.89 mg/dL (ref 0.4–1.5)
GFR calc Af Amer: >60 mL/min (ref >60)
GFR calc non Af Amer: >60 mL/min (ref >60)
GFR calc non Af Amer: >60 mL/min (ref >60)
Glucose, Bld: 110 mg/dL — ABNORMAL HIGH (ref 70–99)
Glucose, Bld: 140 mg/dL — ABNORMAL HIGH (ref 70–99)
Sodium: 137 mEq/L (ref 135–145)

## 2010-08-28 LAB — LITHIUM LEVEL
Lithium Lvl: 0.6 mEq/L — ABNORMAL LOW (ref 0.80–1.40)
Lithium Lvl: 0.74 mEq/L — ABNORMAL LOW (ref 0.80–1.40)
Lithium Lvl: <0.25 mEq/L — ABNORMAL LOW (ref 0.80–1.40)

## 2010-08-28 LAB — DIFFERENTIAL
Basophils Relative: 1 % (ref 0–1)
Eosinophils Absolute: 0.5 10*3/uL (ref 0.0–0.7)
Eosinophils Relative: 10 % — ABNORMAL HIGH (ref 0–5)
Neutrophils Relative %: 68 % (ref 43–77)

## 2010-08-28 LAB — HEPATIC FUNCTION PANEL
AST: 23 U/L (ref 0–37)
Bilirubin, Direct: 0.2 mg/dL (ref 0.0–0.3)
Indirect Bilirubin: 0.2 mg/dL — ABNORMAL LOW (ref 0.3–0.9)

## 2010-08-28 LAB — ETHANOL: Alcohol, Ethyl (B): <5 mg/dL (ref 0–10)

## 2010-08-29 LAB — RAPID URINE DRUG SCREEN, HOSP PERFORMED
Amphetamines: NOT DETECTED
Barbiturates: NOT DETECTED
Benzodiazepines: NOT DETECTED
Tetrahydrocannabinol: NOT DETECTED

## 2010-08-29 LAB — CBC
HCT: 46.8 % (ref 39.0–52.0)
Hemoglobin: 15.3 g/dL (ref 13.0–17.0)
Platelets: 220 10*3/uL (ref 150–400)
RBC: 5.09 MIL/uL (ref 4.22–5.81)
WBC: 6.3 10*3/uL (ref 4.0–10.5)

## 2010-08-29 LAB — LITHIUM LEVEL: Lithium Lvl: 0.55 mEq/L — ABNORMAL LOW (ref 0.80–1.40)

## 2010-08-29 LAB — BASIC METABOLIC PANEL
BUN: 13 mg/dL (ref 6–23)
CO2: 27 mEq/L (ref 19–32)
Calcium: 9.3 mg/dL (ref 8.4–10.5)
GFR calc non Af Amer: >60 mL/min (ref >60)
Glucose, Bld: 122 mg/dL — ABNORMAL HIGH (ref 70–99)

## 2010-08-29 LAB — DIFFERENTIAL
Eosinophils Relative: 6 % — ABNORMAL HIGH (ref 0–5)
Lymphocytes Relative: 16 % (ref 12–46)
Lymphs Abs: 1 10*3/uL (ref 0.7–4.0)
Monocytes Absolute: 0.3 10*3/uL (ref 0.1–1.0)
Monocytes Relative: 5 % (ref 3–12)

## 2010-08-29 LAB — TSH: TSH: 1.141 u[IU]/mL (ref 0.350–4.500)

## 2010-08-30 LAB — CBC
Platelets: 164 10*3/uL (ref 150–400)
RDW: 13.9 % (ref 11.5–15.5)

## 2010-08-30 LAB — RAPID URINE DRUG SCREEN, HOSP PERFORMED
Benzodiazepines: NOT DETECTED
Cocaine: POSITIVE — AB
Tetrahydrocannabinol: NOT DETECTED

## 2010-08-30 LAB — COMPREHENSIVE METABOLIC PANEL
ALT: 23 U/L (ref 0–53)
Albumin: 3.9 g/dL (ref 3.5–5.2)
Alkaline Phosphatase: 81 U/L (ref 39–117)
Potassium: 3.7 mEq/L (ref 3.5–5.1)
Sodium: 138 mEq/L (ref 135–145)
Total Protein: 6.7 g/dL (ref 6.0–8.3)

## 2010-08-30 LAB — URINALYSIS, ROUTINE W REFLEX MICROSCOPIC
Nitrite: NEGATIVE
pH: 5.5 (ref 5.0–8.0)

## 2010-08-30 LAB — LITHIUM LEVEL: Lithium Lvl: <0.25 mEq/L — ABNORMAL LOW (ref 0.80–1.40)

## 2010-08-30 LAB — ETHANOL: Alcohol, Ethyl (B): <5 mg/dL (ref 0–10)

## 2010-08-30 LAB — DIFFERENTIAL
Basophils Absolute: 0 10*3/uL (ref 0.0–0.1)
Basophils Relative: 0 % (ref 0–1)
Lymphocytes Relative: 14 % (ref 12–46)
Neutro Abs: 3.8 10*3/uL (ref 1.7–7.7)
Neutrophils Relative %: 68 % (ref 43–77)

## 2010-08-31 LAB — CBC
HCT: 46.9 % (ref 39.0–52.0)
Hemoglobin: 15.5 g/dL (ref 13.0–17.0)
MCHC: 33.1 g/dL (ref 30.0–36.0)
MCV: 90.1 fL (ref 78.0–100.0)
Platelets: 220 10*3/uL (ref 150–400)
RBC: 4.99 MIL/uL (ref 4.22–5.81)
RDW: 13.1 % (ref 11.5–15.5)
WBC: 4.3 10*3/uL (ref 4.0–10.5)

## 2010-08-31 LAB — DIFFERENTIAL
Basophils Absolute: 0 10*3/uL (ref 0.0–0.1)
Basophils Relative: 0 % (ref 0–1)
Eosinophils Absolute: 0.6 10*3/uL (ref 0.0–0.7)
Eosinophils Absolute: 0.4 10*3/uL (ref 0.0–0.7)
Eosinophils Relative: 8 % — ABNORMAL HIGH (ref 0–5)
Lymphocytes Relative: 24 % (ref 12–46)
Lymphs Abs: 1 10*3/uL (ref 0.7–4.0)
Monocytes Absolute: 0.3 10*3/uL (ref 0.1–1.0)
Monocytes Relative: 8 % (ref 3–12)
Neutro Abs: 2.3 10*3/uL (ref 1.7–7.7)
Neutrophils Relative %: 54 % (ref 43–77)

## 2010-08-31 LAB — BASIC METABOLIC PANEL
BUN: 15 mg/dL (ref 6–23)
CO2: 27 mEq/L (ref 19–32)
Calcium: 9.2 mg/dL (ref 8.4–10.5)
Glucose, Bld: 113 mg/dL — ABNORMAL HIGH (ref 70–99)
Potassium: 3.9 mEq/L (ref 3.5–5.1)
Sodium: 139 mEq/L (ref 135–145)

## 2010-08-31 LAB — URINALYSIS, ROUTINE W REFLEX MICROSCOPIC
Nitrite: NEGATIVE
Nitrite: NEGATIVE
Protein, ur: NEGATIVE mg/dL
Protein, ur: NEGATIVE mg/dL
Specific Gravity, Urine: 1.023 (ref 1.005–1.030)
Urobilinogen, UA: 1 mg/dL (ref 0.0–1.0)
pH: 5.5 (ref 5.0–8.0)

## 2010-08-31 LAB — RAPID URINE DRUG SCREEN, HOSP PERFORMED
Amphetamines: NOT DETECTED
Barbiturates: NOT DETECTED
Cocaine: POSITIVE — AB
Opiates: NOT DETECTED

## 2010-08-31 LAB — TSH: TSH: 1.755 u[IU]/mL (ref 0.350–4.500)

## 2010-09-01 LAB — POCT I-STAT, CHEM 8
BUN: 29 mg/dL — ABNORMAL HIGH (ref 6–23)
Calcium, Ion: 1.19 mmol/L (ref 1.12–1.32)
Chloride: 105 meq/L (ref 96–112)
Creatinine, Ser: 1.3 mg/dL (ref 0.4–1.5)
Glucose, Bld: 110 mg/dL — ABNORMAL HIGH (ref 70–99)
HCT: 48 % (ref 39.0–52.0)
Hemoglobin: 16.3 g/dL (ref 13.0–17.0)
Potassium: 3.7 mEq/L (ref 3.5–5.1)
Sodium: 140 mEq/L (ref 135–145)
TCO2: 25 mmol/L (ref 0–100)

## 2010-09-01 LAB — ETHANOL
Alcohol, Ethyl (B): <5 mg/dL (ref 0–10)
Alcohol, Ethyl (B): <5 mg/dL (ref 0–10)

## 2010-09-01 LAB — TRICYCLICS SCREEN, URINE: TCA Scrn: NOT DETECTED

## 2010-09-01 LAB — CBC
Hemoglobin: 14.9 g/dL (ref 13.0–17.0)
RBC: 5.01 MIL/uL (ref 4.22–5.81)
RDW: 13.1 % (ref 11.5–15.5)

## 2010-09-01 LAB — RAPID URINE DRUG SCREEN, HOSP PERFORMED
Amphetamines: NOT DETECTED
Tetrahydrocannabinol: NOT DETECTED

## 2010-09-01 LAB — COMPREHENSIVE METABOLIC PANEL
ALT: 32 U/L (ref 0–53)
AST: 49 U/L — ABNORMAL HIGH (ref 0–37)
Alkaline Phosphatase: 61 U/L (ref 39–117)
Calcium: 9.1 mg/dL (ref 8.4–10.5)
GFR calc Af Amer: >60 mL/min (ref >60)
Glucose, Bld: 91 mg/dL (ref 70–99)
Potassium: 3.7 mEq/L (ref 3.5–5.1)
Sodium: 140 mEq/L (ref 135–145)
Total Protein: 7.4 g/dL (ref 6.0–8.3)

## 2010-09-01 LAB — DIFFERENTIAL
Basophils Relative: 0 % (ref 0–1)
Eosinophils Absolute: 0.4 10*3/uL (ref 0.0–0.7)
Eosinophils Relative: 6 % — ABNORMAL HIGH (ref 0–5)
Lymphs Abs: 1.1 10*3/uL (ref 0.7–4.0)
Monocytes Absolute: 0.9 10*3/uL (ref 0.1–1.0)
Monocytes Relative: 12 % (ref 3–12)
Neutrophils Relative %: 68 % (ref 43–77)

## 2010-09-01 LAB — URINE MICROSCOPIC-ADD ON

## 2010-09-01 LAB — URINALYSIS, ROUTINE W REFLEX MICROSCOPIC
Nitrite: NEGATIVE
Specific Gravity, Urine: 1.024 (ref 1.005–1.030)
Urobilinogen, UA: 1 mg/dL (ref 0.0–1.0)
pH: 6 (ref 5.0–8.0)

## 2010-09-01 LAB — LITHIUM LEVEL
Lithium Lvl: <0.25 mEq/L — ABNORMAL LOW (ref 0.80–1.40)
Lithium Lvl: <0.25 mEq/L — ABNORMAL LOW (ref 0.80–1.40)

## 2010-09-02 LAB — CBC
HCT: 46.2 % (ref 39.0–52.0)
Hemoglobin: 14.1 g/dL (ref 13.0–17.0)
MCHC: 32.5 g/dL (ref 30.0–36.0)
MCHC: 32.9 g/dL (ref 30.0–36.0)
MCV: 90.5 fL (ref 78.0–100.0)
MCV: 91 fL (ref 78.0–100.0)
Platelets: 190 10*3/uL (ref 150–400)
RBC: 4.72 MIL/uL (ref 4.22–5.81)
RBC: 5.08 MIL/uL (ref 4.22–5.81)
RDW: 13.2 % (ref 11.5–15.5)
WBC: 4.9 10*3/uL (ref 4.0–10.5)

## 2010-09-02 LAB — LITHIUM LEVEL
Lithium Lvl: 0.46 mEq/L — ABNORMAL LOW (ref 0.80–1.40)
Lithium Lvl: <0.25 mEq/L — ABNORMAL LOW (ref 0.80–1.40)

## 2010-09-02 LAB — DIFFERENTIAL
Eosinophils Absolute: 0.5 10*3/uL (ref 0.0–0.7)
Lymphocytes Relative: 18 % (ref 12–46)
Lymphs Abs: 0.9 10*3/uL (ref 0.7–4.0)
Neutro Abs: 2.9 10*3/uL (ref 1.7–7.7)
Neutrophils Relative %: 61 % (ref 43–77)

## 2010-09-02 LAB — BASIC METABOLIC PANEL
BUN: 10 mg/dL (ref 6–23)
CO2: 29 mEq/L (ref 19–32)
Chloride: 104 mEq/L (ref 96–112)
Creatinine, Ser: 1.14 mg/dL (ref 0.4–1.5)
Potassium: 4 mEq/L (ref 3.5–5.1)

## 2010-09-02 LAB — COMPREHENSIVE METABOLIC PANEL
CO2: 25 mEq/L (ref 19–32)
Calcium: 8.9 mg/dL (ref 8.4–10.5)
Creatinine, Ser: 1.11 mg/dL (ref 0.4–1.5)
GFR calc non Af Amer: >60 mL/min (ref >60)
Glucose, Bld: 105 mg/dL — ABNORMAL HIGH (ref 70–99)
Total Protein: 6.7 g/dL (ref 6.0–8.3)

## 2010-09-02 LAB — URINE MICROSCOPIC-ADD ON

## 2010-09-02 LAB — URINALYSIS, ROUTINE W REFLEX MICROSCOPIC
Glucose, UA: NEGATIVE mg/dL
Protein, ur: NEGATIVE mg/dL
Specific Gravity, Urine: 1.03 (ref 1.005–1.030)
pH: 5.5 (ref 5.0–8.0)

## 2010-09-02 LAB — RAPID URINE DRUG SCREEN, HOSP PERFORMED: Benzodiazepines: NOT DETECTED

## 2010-09-03 LAB — DIFFERENTIAL
Lymphocytes Relative: 13 % (ref 12–46)
Lymphs Abs: 0.8 10*3/uL (ref 0.7–4.0)
Neutrophils Relative %: 73 % (ref 43–77)

## 2010-09-03 LAB — COMPREHENSIVE METABOLIC PANEL
Albumin: 4.2 g/dL (ref 3.5–5.2)
Alkaline Phosphatase: 81 U/L (ref 39–117)
BUN: 21 mg/dL (ref 6–23)
Calcium: 9.3 mg/dL (ref 8.4–10.5)
Creatinine, Ser: 1.18 mg/dL (ref 0.4–1.5)
GFR calc Af Amer: >60 mL/min (ref >60)
Glucose, Bld: 103 mg/dL — ABNORMAL HIGH (ref 70–99)
Sodium: 140 mEq/L (ref 135–145)
Total Protein: 7.4 g/dL (ref 6.0–8.3)

## 2010-09-03 LAB — RAPID URINE DRUG SCREEN, HOSP PERFORMED
Amphetamines: NOT DETECTED
Barbiturates: NOT DETECTED
Benzodiazepines: NOT DETECTED
Benzodiazepines: NOT DETECTED
Cocaine: NOT DETECTED
Cocaine: POSITIVE — AB
Opiates: NOT DETECTED

## 2010-09-03 LAB — BASIC METABOLIC PANEL
BUN: 21 mg/dL (ref 6–23)
Creatinine, Ser: 1.22 mg/dL (ref 0.4–1.5)
GFR calc non Af Amer: >60 mL/min (ref >60)
Potassium: 3.9 mEq/L (ref 3.5–5.1)

## 2010-09-03 LAB — POCT I-STAT, CHEM 8
BUN: 15 mg/dL (ref 6–23)
Calcium, Ion: 1.24 mmol/L (ref 1.12–1.32)
Chloride: 105 mEq/L (ref 96–112)
Glucose, Bld: 102 mg/dL — ABNORMAL HIGH (ref 70–99)
HCT: 50 % (ref 39.0–52.0)
TCO2: 27 mmol/L (ref 0–100)

## 2010-09-03 LAB — ETHANOL
Alcohol, Ethyl (B): <5 mg/dL (ref 0–10)
Alcohol, Ethyl (B): <5 mg/dL (ref 0–10)

## 2010-09-03 LAB — CBC
HCT: 46.5 % (ref 39.0–52.0)
Platelets: 171 10*3/uL (ref 150–400)
WBC: 6.3 10*3/uL (ref 4.0–10.5)

## 2010-09-03 LAB — TRICYCLICS SCREEN, URINE: TCA Scrn: NOT DETECTED

## 2010-09-03 LAB — LITHIUM LEVEL
Lithium Lvl: 0.68 mEq/L — ABNORMAL LOW (ref 0.80–1.40)
Lithium Lvl: <0.25 mEq/L — ABNORMAL LOW (ref 0.80–1.40)

## 2010-09-03 LAB — VALPROIC ACID LEVEL: Valproic Acid Lvl: <10 ug/mL — ABNORMAL LOW (ref 50.0–100.0)

## 2010-09-04 LAB — DIFFERENTIAL
Basophils Absolute: 0 10*3/uL (ref 0.0–0.1)
Basophils Relative: 1 % (ref 0–1)
Eosinophils Absolute: 0.4 10*3/uL (ref 0.0–0.7)
Monocytes Relative: 9 % (ref 3–12)
Neutrophils Relative %: 69 % (ref 43–77)

## 2010-09-04 LAB — URINALYSIS, ROUTINE W REFLEX MICROSCOPIC
Glucose, UA: NEGATIVE mg/dL
Hgb urine dipstick: NEGATIVE
pH: 7 (ref 5.0–8.0)

## 2010-09-04 LAB — RAPID URINE DRUG SCREEN, HOSP PERFORMED
Amphetamines: NOT DETECTED
Barbiturates: NOT DETECTED
Benzodiazepines: POSITIVE — AB
Cocaine: NOT DETECTED

## 2010-09-04 LAB — CBC
MCHC: 32.8 g/dL (ref 30.0–36.0)
MCV: 93.1 fL (ref 78.0–100.0)
MCV: 92.6 fL (ref 78.0–100.0)
Platelets: 212 10*3/uL (ref 150–400)
Platelets: 204 10*3/uL (ref 150–400)
RDW: 13.2 % (ref 11.5–15.5)
WBC: 5.9 10*3/uL (ref 4.0–10.5)

## 2010-09-04 LAB — COMPREHENSIVE METABOLIC PANEL
AST: 22 U/L (ref 0–37)
Albumin: 3.9 g/dL (ref 3.5–5.2)
BUN: 11 mg/dL (ref 6–23)
CO2: 30 mEq/L (ref 19–32)
Calcium: 9.4 mg/dL (ref 8.4–10.5)
Creatinine, Ser: 1.17 mg/dL (ref 0.4–1.5)
GFR calc Af Amer: >60 mL/min (ref >60)
GFR calc non Af Amer: >60 mL/min (ref >60)

## 2010-09-04 LAB — VALPROIC ACID LEVEL: Valproic Acid Lvl: 46 ug/mL — ABNORMAL LOW (ref 50.0–100.0)

## 2010-09-04 LAB — BASIC METABOLIC PANEL
BUN: 10 mg/dL (ref 6–23)
CO2: 27 mEq/L (ref 19–32)
Chloride: 107 mEq/L (ref 96–112)
Creatinine, Ser: 1.02 mg/dL (ref 0.4–1.5)
Glucose, Bld: 115 mg/dL — ABNORMAL HIGH (ref 70–99)

## 2010-09-04 LAB — LITHIUM LEVEL
Lithium Lvl: 0.53 mEq/L — ABNORMAL LOW (ref 0.80–1.40)
Lithium Lvl: 0.59 mEq/L — ABNORMAL LOW (ref 0.80–1.40)

## 2010-09-25 ENCOUNTER — Emergency Department (HOSPITAL_COMMUNITY)
Admission: EM | Admit: 2010-09-25 | Discharge: 2010-10-01 | Disposition: A | Discharge status: Other Acute Inpatient Hospital | Payer: Medicaid Other | Attending: Emergency Medicine | Admitting: Emergency Medicine

## 2010-09-25 DIAGNOSIS — R45851 Suicidal ideations: Secondary | ICD-10-CM | POA: Insufficient documentation

## 2010-09-25 DIAGNOSIS — Z79899 Other long term (current) drug therapy: Secondary | ICD-10-CM | POA: Insufficient documentation

## 2010-09-25 DIAGNOSIS — F3130 Bipolar disorder, current episode depressed, mild or moderate severity, unspecified: Secondary | ICD-10-CM | POA: Insufficient documentation

## 2010-09-25 LAB — CBC
MCV: 89.9 fL (ref 78.0–100.0)
Platelets: 224 10*3/uL (ref 150–400)
RBC: 5.04 MIL/uL (ref 4.22–5.81)
RDW: 12.9 % (ref 11.5–15.5)
WBC: 9 10*3/uL (ref 4.0–10.5)

## 2010-09-25 LAB — BASIC METABOLIC PANEL
Calcium: 9.6 mg/dL (ref 8.4–10.5)
GFR calc Af Amer: >60 mL/min (ref >60)
GFR calc non Af Amer: >60 mL/min (ref >60)
Glucose, Bld: 101 mg/dL — ABNORMAL HIGH (ref 70–99)
Potassium: 3.6 mEq/L (ref 3.5–5.1)
Sodium: 139 mEq/L (ref 135–145)

## 2010-09-25 LAB — ETHANOL: Alcohol, Ethyl (B): 122 mg/dL — ABNORMAL HIGH (ref 0–10)

## 2010-09-25 LAB — DIFFERENTIAL
Basophils Absolute: 0 10*3/uL (ref 0.0–0.1)
Eosinophils Absolute: 0.3 10*3/uL (ref 0.0–0.7)
Lymphs Abs: 1.7 10*3/uL (ref 0.7–4.0)
Neutrophils Relative %: 71 % (ref 43–77)

## 2010-09-25 LAB — LITHIUM LEVEL: Lithium Lvl: <0.25 mEq/L — ABNORMAL LOW (ref 0.80–1.40)

## 2010-09-26 DIAGNOSIS — F319 Bipolar disorder, unspecified: Secondary | ICD-10-CM

## 2010-09-26 LAB — RAPID URINE DRUG SCREEN, HOSP PERFORMED
Barbiturates: NOT DETECTED
Cocaine: POSITIVE — AB
Opiates: NOT DETECTED

## 2010-09-30 DIAGNOSIS — F319 Bipolar disorder, unspecified: Secondary | ICD-10-CM

## 2010-10-01 ENCOUNTER — Emergency Department (HOSPITAL_COMMUNITY)
Admission: EM | Admit: 2010-10-01 | Discharge: 2010-10-10 | Disposition: A | Discharge status: Other Acute Inpatient Hospital | Payer: Medicaid Other | Attending: Emergency Medicine | Admitting: Emergency Medicine

## 2010-10-01 DIAGNOSIS — Z79899 Other long term (current) drug therapy: Secondary | ICD-10-CM | POA: Insufficient documentation

## 2010-10-01 DIAGNOSIS — R45851 Suicidal ideations: Secondary | ICD-10-CM | POA: Insufficient documentation

## 2010-10-01 DIAGNOSIS — F319 Bipolar disorder, unspecified: Secondary | ICD-10-CM | POA: Insufficient documentation

## 2010-10-02 DIAGNOSIS — F4322 Adjustment disorder with anxiety: Secondary | ICD-10-CM

## 2010-10-05 DIAGNOSIS — F319 Bipolar disorder, unspecified: Secondary | ICD-10-CM

## 2010-10-06 DIAGNOSIS — F339 Major depressive disorder, recurrent, unspecified: Secondary | ICD-10-CM

## 2010-10-07 DIAGNOSIS — F39 Unspecified mood [affective] disorder: Secondary | ICD-10-CM
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
## 2010-10-08 NOTE — H&P (Signed)
[name], [name]               ACCOUNT NO.:  [id number]   MEDICAL RECORD NO.:  [id number]          PATIENT TYPE:  IPS   LOCATION:  0503                          FACILITY:  BH   PHYSICIAN:  Geoffery Lyons, M.D.      DATE OF BIRTH:  [birth year]   DATE OF ADMISSION:  01/01/2009  DATE OF DISCHARGE:                       PSYCHIATRIC ADMISSION ASSESSMENT   PATIENT IDENTIFICATION:  This is a 50 year old male voluntarily admitted  on January 01, 2009.   HISTORY OF PRESENT ILLNESS:  The patient presents with depression and  suicidal thoughts.  No specific plan.  Also relapsed on cocaine after  having problems with a relationship.  He states that he is tired.  He  needs to stop this and is here to get help.   PAST PSYCHIATRIC HISTORY:  The patient was here in June.  He has had  multiple admissions to our facility and other psychiatric hospitals.  Last time was for depression, polysubstance abuse.  Last diagnosed with  polysubstance dependence and bipolar disorder.  Was to follow up at  [facility].   SOCIAL HISTORY:  The patient has poor social support and is possibly  homeless.  His last living situation seems to have been with his mother.   FAMILY HISTORY:  None.   ALCOHOL AND DRUG HABITS:  As above.  The patient has been drinking and  using cocaine.   PRIMARY CARE Daud Cayer:  None.   MEDICAL PROBLEMS:  Denies any acute or chronic health issues.   MEDICATIONS:  1. Lithium 300 mg three in the a.m., two at h.s.  2. Ambien 10 mg at h.s.  3. Vistaril 25 mg as needed.   DRUG ALLERGIES:  NO KNOWN ALLERGIES.   PHYSICAL EXAMINATION:  This is a well-nourished, well-developed middle-  aged male, fully assessed at [facility] emergency department.  Physical  exam was reviewed.  No significant findings.   LABORATORY DATA:  Shows urine drug screen, for cocaine.  CBC is within  normal limits.  Alcohol level less than 5.   MENTAL STATUS EXAM:  The patient is in the bed,  pleasant.  Speech soft-  spoken.  Embarrassed about returning to our facility.  Has a sense of  humor.  Thoughts processes are coherent, goal directed.  Denies any  suicidal thoughts at this time.  No delusional statements.  Does not  appear to be responding to internal stimuli.  Cognitive function intact.  Memory appears intact.  Judgment insight minimal.   DISCHARGE DIAGNOSES:  AXIS I:  Polysubstance dependence, depressive  disorder NOS.  AXIS II:  Deferred.  AXIS III:  No known medical conditions.  AXIS IV:  Possible problems with housing, other psychosocial problems  related to chronic substance use.  Noncompliance.  AXIS V:  Current is 35.   PLAN:  Detox the patient.  The patient will be in the red group.  Will  work on relapse prevention.  Will continue to assess comorbidities.  Case manager will assess his living arrangements.  Tentative length of  stay at this time is 3-4 days.      Perry Nixon  [name], N.P.      Geoffery Lyons, M.D.  Electronically Signed    JO/MEDQ  D:  01/02/2009  T:  01/02/2009  Job:  [id number]

## 2010-10-08 NOTE — H&P (Signed)
Perry, [name]               ACCOUNT NO.:  [id number]   MEDICAL RECORD NO.:  [id number]          PATIENT TYPE:  IPS   LOCATION:  0506                          FACILITY:  [facility]   PHYSICIAN:  [name], P.A.-C.DATE OF BIRTH:  [birth year]   DATE OF ADMISSION:  05/01/2007  DATE OF DISCHARGE:                       PSYCHIATRIC ADMISSION ASSESSMENT   This is a voluntary admission to the services of Dr. Geralyn [name].  This  is a 50 year old single African-American male. He presents as a  walk in. He states that he was living with his sister, but she moved  back to her husband's household. He had an argument with his sister and  her husband and he was told to leave or she would call the police. He  believes he should have  moved into a group home. He reports that he is  now suicidal. He has been noncompliant with his medications. He  acknowledges six days, it might have been longer. He is also drinking  and he claims that his mother was cashing his check and spending his  money. He states he is embarrassed to be to be returning. He apparently  poked his right index finger with a knife drawing blood and this was his  suicidal gesture today.   PAST PSYCHIATRIC HISTORY:  [name] has had many admissions, but that is  the most recent being 11/2 to 04/06/2007.   SOCIAL HISTORY:  He graduated high school. He never married. He has no  children. He is not employed. He gets SSI for mental illness.   FAMILY HISTORY:  Is remarkable in that his father was also an alcoholic.   PAST MEDICAL HISTORY:  He denies having a primary care [name]. He is  followed at [facility] when and if he goes by Dr.  Hortencia [name].   MEDICAL PROBLEMS:  He has none known.   MEDICATIONS:  When discharged back in November, [name] was prescribed  Neurontin 100 mg daily, Lithium 300 mg b.i.d. and 600 at bed time.  He  also was prescribed Ambien 10 mg at h.s.   DRUG ALLERGIES:  No known drug  allergies   PHYSICAL EXAMINATION:  Positive physical findings, this is a well-  developed, well-nourished male, who appears his stated age. Vital signs  on admission shows he is 75 inches tall, weighs 223 pounds. Temperature  is 98.1. Blood pressure is 112/62, the 123/70. Pulse is 86 to 81.  Respirations are 20.  He describes chronic pain in his shoulders and  uses what ever he can get for pain relief.   MENTAL STATUS EXAM:  Today, he is alert and oriented. He is causally  groomed and dressed. He appears to be adequately nourished. His speech  is slow. There is positive speech. His mood is depressed. His affect is  congruent. Thought processes are clear, rational and goal oriented. He  wants to be taken care of. Judgment and insight are fair. Concentration  and memory are intact. Intelligence is average.  He reports that he is  still suicidal, although it has decreased since entering the hospital  once again. He  reports he has had thoughts to overdose, cut himself, get  a car somehow and harm himself with the car, etc.  He denies homicidal  ideation. He states he has positive auditory hallucinations. They say  you are a loser. He told the intake people that he hears his deceased  grandmother's voice, Jesus's voice, the devil's voice and they tell him  to kill himself and/or a Occupational hygienist.   AXIS I:  Mood disorder NOS, noncompliant with prescribed meds. Alcohol  and cocaine dependence. He readily reports relapse on alcohol. Will  check his UDS to see if he has relapsed on cocaine.   AXIS II:  Deferred.   AXIS III:  None known.   AXIS IV:  Problems with primary support group, economic housing, etc.   AXIS V:  25.   PLAN:  The plan is to admit for safety and stabilization. Will restart  his meds. Case manager needs to look into a group home or an [facility] for discharge placement. Estimated length of stay is three to four  days.      [name], P.A.-C.      MD/MEDQ  D:  05/01/2007  T:  05/03/2007  Job:  [id number]

## 2010-10-08 NOTE — Discharge Summary (Signed)
[name], [name]               ACCOUNT NO.:  [id number]   MEDICAL RECORD NO.:  [id number]          PATIENT TYPE:  IPS   LOCATION:  0304                          FACILITY:  [facility]   PHYSICIAN:  Jasmine Pang, M.D. DATE OF BIRTH:  [birth year]   DATE OF ADMISSION:  11/25/2008  DATE OF DISCHARGE:  12/01/2008                               DISCHARGE SUMMARY   IDENTIFYING INFORMATION:  This is a 50 year old single and African  American male, who was admitted on a voluntary basis on November 25, 2008.   HISTORY OF PRESENT ILLNESS:  The patient was having suicidal thoughts of  overdose.  He states he has gotten reinvolved with a girlfriend due to  the loneliness.  He cannot resist her.  Then when the relationship  goes bad that, he uses cocaine and then he became suicidal.  His last  [facility] admission was 2 weeks ago.  He has frequent relapses.  For further  information, see psychiatric admission assessment.  Initially, he was  given an axis I disorder of mood disorder, NOS.  There was no diagnosis  on axis III.   PHYSICAL FINDINGS:  There were no acute physical or medical problems  noted.   HOSPITAL COURSE:  Upon admission, the patient was on lithium carbonate  600 mg in the morning and at h.s.  He was also on Vistaril 50 mg p.o.  b.i.d. p.r.n. anxiety and Ambien 10 mg p.o. q.h.s.  He discussed his  relapse on alcohol.  He has been still been thinking of a girlfriend,  who broke up with him.  He stated he is upset because his family is  angry with me.  Sleep was poor and appetite was poor.  He was very  depressed.  He has had positive suicidal ideation, thinking of taking  pills, cutting wrists, or jump in front of a car.  He was hearing  auditory hallucinations you are a looser. It is time for you to die.  His thoughts were logical and goal directed.  He continued to be  depressed and anxious as hospitalization progressed.  He was started on  Zyprexa 5 mg p.o. q.h.s. and Celexa 20 mg p.o.  q.a.m..  On November 29, 2008,  lithium level was obtained, it was 0.46 (0.8 to 1.40).  TSH was normal  at 2.050, BMET was within normal limits.  CBC was within normal limits.  On December 01, 2008, mental status had improved markedly from admission  status.  The patient was less depressed and less anxious.  Sleep was  good.  There was no suicidal or homicidal ideation.  Affect was  consistent with mood.  No auditory or visual hallucinations.  No  paranoia or delusions.  Thoughts were logical and goal-directed.  Thought content, no predominant theme.  Cognitive was grossly intact.  Insight good.  Judgment good.  Impulse control good.  There were no side  effects on his medications.  The patient wanted to go and was felt to be  safe for discharge.  He was going to a shelter in [place], [place], and he was  given an [employer] ticket to ride there.   DISCHARGE DIAGNOSES:  Axis I:  Mood disorder, not otherwise specified.  Axis II:  Features of personality disorder, not otherwise specified.  Axis III:  No diagnosis.  Axis IV:  Severe (problems with primary support group, problems related  to social environment, occupational problem, housing problem, economic  problem, burden of psychiatric illness).  Axis V:  Global assessment of functioning was 50 upon discharge.  GAF  was 35 upon admission.  GAF highest past year was 60-65.   DISCHARGE PLANS:  There was no specific activity level or dietary  restrictions.   POST HOSPITAL CARE PLANS:  The patient will be going to the [facility] on December 04, 2008 at 9 a.m.   DISCHARGE MEDICATIONS:  1. Zyprexa 5 mg at bedtime.  2. Celexa 20 mg in the morning.  3. Lithium carbonate 600 mg in the morning and at bedtime.  4. Vistaril 50 mg twice a day as needed for anxiety.      Jasmine Pang, M.D.  Electronically Signed     BHS/MEDQ  D:  12/01/2008  T:  12/02/2008  Job:  [id number]

## 2010-10-08 NOTE — Consult Note (Signed)
NAMESHAE, AUGELLO               ACCOUNT NO.:  [id number]   MEDICAL RECORD NO.:  [id number]           PATIENT TYPE:   LOCATION:                               FACILITY:  [facility]   PHYSICIAN:  Antonietta Breach, M.D.  DATE OF BIRTH:  Nov 24, 1960   DATE OF CONSULTATION:  01/10/2009  DATE OF DISCHARGE:                                 CONSULTATION    [name] does continue with depressed mood, low energy, poor  concentrating, and thoughts of suicide.   MENTAL STATUS EXAMINATION:  [name] is alert.  His eye contact is  intermittent.  Concentration mildly decreased.  He is oriented to all  spheres.  Memory is intact to immediate, recent, and remote.  His  thought process is logical, coherent, goal-directed.  No looseness of  associations.  Thought content, he does have suicidal ideation and  hopelessness.  His insight is partial.  His judgment is impaired.   ASSESSMENT:  Axis I:  1. Mood disorder, 293.83, not otherwise specified (idiopathic bipolar      disorder and substance abuse factors), depressed.  2. Bipolar disorder, 296.80, not otherwise specified.  3. Cocaine dependence.   RECOMMENDATIONS:  1. Would continue with his lithium 600 mg b.i.d. with a lithium 12-      hour trough blood level taken in 4 days.  2. Admit to an inpatient psychiatric unit for a dual diagnosis      track.  3. Twelve-step method.      Antonietta Breach, M.D.      JW/MEDQ  D:  02/11/2010  T:  02/12/2010  Job:  [id number]

## 2010-10-08 NOTE — H&P (Signed)
[name], [name]               ACCOUNT NO.:  [id number]   MEDICAL RECORD NO.:  [id number]          PATIENT TYPE:  IPS   LOCATION:  0505                          FACILITY:  [facility]   PHYSICIAN:  Vic Ripper, P.A.-C.DATE OF BIRTH:  1961-03-23   DATE OF ADMISSION:  02/05/2008  DATE OF DISCHARGE:                       PSYCHIATRIC ADMISSION ASSESSMENT   This is a voluntary admission to the services of Dr. Geoffery Lyons.   This is a 50 year old, single, African American male.  [name] was just  discharged on September 9th.  Apparently he did not like his placement,  and he returns in crisis after arguing with the group home manager over  signing in and financial arrangements for payment.  He presented  yesterday requesting admission so that he would not relapse on drugs and  alcohol.  His UDS was negative.   PAST PSYCHIATRIC HISTORY:  He was just with [place] September 03 to September  09, August 02 to August 20, May 02 to May 08, April 22 to April 27.   SOCIAL HISTORY:  Unchanged.   FAMILY HISTORY:  Remains positive for his father also being an  alcoholic.   PRIMARY CARE PHYSICIAN:  Has not yet been identified nor has outside  psychiatric are as he does not stay in placement long enough.   PAST MEDICAL HISTORY:  He reports chronic pain.   MEDICATIONS:  It appears that he was discharged on:  1. Lithium citrate 15 mEq a.m. and h.s.  2. Vistaril 50 mg at h.s.   DRUG ALLERGIES:  No known drug allergies.   PHYSICAL EXAMINATION:  He was cleared in the emergency department.  Of  note, his UDS was negative.  His vital signs on admission show he is 6  feet 1, weighs 208.5 pounds, temperature is 98.2, blood pressure is  111/66 and 114/70, pulse is 77 to 80, respirations are 20.   MENTAL STATUS EXAM:  Today, he is alert and oriented.  He is  appropriately groomed, dressed, and nourished.  His speech is a normal  rate, rhythm, and tone.  His mood on admission was very angry.  He has  calmed down.  Thought processes are not as clear, rational, or goal  oriented as they should be.  He does not want to accept that to stay in  placement he will have to turn over his check.  Judgment and insight are  fair.  Concentration and memory are good.  Intelligence is average.  He  would not contract for safety yesterday, but he is very system savvy.   DIAGNOSES:   AXIS I:  1. Bipolar, not otherwise specified.  2. Polysubstance abuse, usually alcohol and cocaine, currently does      not have any in his system.   AXIS II:  Deferred.   AXIS III:  None known.   AXIS IV:  Housing issues.   AXIS V:  Thirty-five.   The plan is to increase his lithium to t.i.d. and will have the case  manager work on placement.      Mickie Leonarda Salon, P.A.-C.     MD/MEDQ  D:  02/06/2008  T:  02/06/2008  Job:  [id number]

## 2010-10-08 NOTE — H&P (Signed)
[name], [name]               ACCOUNT NO.:  [id number]   MEDICAL RECORD NO.:  [id number]          PATIENT TYPE:  IPS   LOCATION:  0503                          FACILITY:  [facility]   PHYSICIAN:  Geoffery Lyons, M.D.      DATE OF BIRTH:  1961-04-20   DATE OF ADMISSION:  09/25/2007  DATE OF DISCHARGE:                       PSYCHIATRIC ADMISSION ASSESSMENT   A 50 year old male voluntarily admitted on Sep 25, 2007.   HISTORY OF PRESENT ILLNESS:  The patient was recently in our facility  was discharged, states he was unable to get into the [facility].  He  states that the other residents did not vote him in.  He then, almost  immediately, relapsed on alcohol and cocaine and was feeling depressed.  He states that he now wants to get into a long-term rehabilitation  program.  Again, he has been off his medications.  He states he was not  discharged on any supplies, and did not have the means to obtain his  medications.   PAST PSYCHIATRIC HISTORY:  The patient has had multiple admissions to  our facility.  He was last here in April 2009 for similar-type symptoms  of depression and substance use.   SOCIAL HISTORY:  This is a 50 year old male who is currently homeless.  She has poor social support.   FAMILY HISTORY:  From previous transcriptions, apparently there is a  family history of alcoholism.   ALCOHOL/DRUG HISTORY:  The patient, again, recently has been drinking  alcohol and using cocaine.   PRIMARY CARE Dymphna Wadley:  None.   MEDICAL PROBLEMS:  The patient denies any known acute-or-chronic health  issues.   MEDICATIONS:  He was discharged on lithium Neurontin, and Claritin.  Again, noncompliant with medications.   DRUG ALLERGIES:  No known allergies.   PHYSICAL EXAM:  GENERAL:  This is a well-nourished, middle-aged male who  was assessed at [facility].  VITAL SIGNS:  His temperature 98.1, heart rate 79, respirations 60,  blood pressure is 116/69.  He is 6  feet 3 inches tall and he is 218  pounds.   His blood alcohol level was 107.  Urine drug screen was positive for  cocaine.  CMET within normal limits.  Lithium level less than 0.25.   MENTAL STATUS EXAM:  He is sleepy and cooperative.  He has a sense humor  about coming back, recanting his story about not getting into the [facility].  Thought process no delusional statements denies any suicidal  thoughts.  Cognitive function intact.  His memory is good.  Judgment  insight is fair.  Poor impulse control.   IMPRESSION:  AXIS I:  1. Bipolar disorder.  2. Polysubstance abuse, rule out dependence.  AXIS II:  Deferred.  AXIS III:  No known medical conditions.  AXIS IV:  Problems with housing, other psychosocial problems, chronic  substance use, lacks support, burden of illness.  AXIS V:  Current is 40.   PLAN:  To detox the patient with Librium protocol will monitor  withdrawal symptoms.  Will continue his medications.  Encourage groups.  Case manager is  to assess any rehab programs or living conditions.   TENTATIVE LENGTH OF STAY:  3-5 days.      Landry Corporal, N.P.      Geoffery Lyons, M.D.  Electronically Signed    JO/MEDQ  D:  09/30/2007  T:  09/30/2007  Job:  [id number]

## 2010-10-08 NOTE — H&P (Signed)
NAMEGONSALO, [name]               ACCOUNT NO.:  [id number]   MEDICAL RECORD NO.:  [id number]          PATIENT TYPE:  IPS   LOCATION:  0508                          FACILITY:  [facility]   PHYSICIAN:  Geoffery Lyons, M.D.      DATE OF BIRTH:  March 25, 1961   DATE OF ADMISSION:  10/09/2008  DATE OF DISCHARGE:                       PSYCHIATRIC ADMISSION ASSESSMENT   IDENTIFYING INFORMATION:  A 50 year old male, single.  This is a  voluntary admission.   HISTORY OF THE PRESENT ILLNESS:  This is one of several [name] admissions  for this 50 year old who presents after relapsing on cocaine this past  week, was at his girlfriend's house and arguing with her.  She called  the police.  He reported he was suicidal with a plan to jump in front of  a car.  Also had some suicidal thoughts towards his ex-girlfriend which  he is denying today.  Drinking some alcohol with his last drink being  yesterday, 2 to 3 beers a day.  Has been living on the street since he  got into arguments with his mother and his sister, says he has been not  sleeping regularly, eating out a trash can.   PAST PSYCHIATRIC HISTORY:  This is the 7th [facility] admission in the past 12  months for this gentleman with a history of bipolar disorder with mood  fluctuations and some elevation characterized by expansiveness of mood  and grandiosity in thinking.  Some increased motor activity.  Previously  has taken lithium fairly steadily although he has not taken any of his  medications now since he was last discharged from our service September 22, 2008.  He also has a history of cocaine and alcohol abuse.  Longest  abstinence is not clear.  No current outpatient treatment, has not kept  followup appointments.   SOCIAL HISTORY:  Single African American male.  He is unemployed.  He  moves back and forth between here and [name] where his mother and  sister live and [place] where he has friends, currently residing in  [name] and is not sure if  his mother will take him back.  No current  legal charges.   FAMILY HISTORY:  Father was an alcoholic.   MEDICAL HISTORY:  No regular primary care.   MEDICAL PROBLEMS:  None.   CURRENT MEDICATIONS:  None.  Previously on lithium 600 mg b.i.d.  Has  also taken Ambien in the past, Restoril in the past, and Risperdal, all  of which he is not taking.   DRUG ALLERGIES:  NONE.   PHYSICAL EXAM:  Was done in the emergency room and is noted in the  record.  Healthy-appearing, tall, well-built male in no physical  distress.  He is calm, cooperative, in bed today.  Fully alert.  Vital  signs were normal.  Urine drug screen positive for cocaine.  Lithium  level less than 0.25.  most recent TSH was normal on September 20, 2008, of  3.599.  Electrolytes unremarkable.  Normal liver enzymes.  CBC all  within normal limits, platelets 171,000.   MENTAL STATUS EXAM:  Fully alert  male, pleasant, cooperative.  Affect  and mood are stable.  Good eye contact.  Regrets relapsing on cocaine.  Mood is neutral today.  Thought process logical and coherent.  Speech is  normal in pace, form, production.  Thoughts logical, coherent, goal  directed.  Insight adequate.  Impulse control and judgment within normal  limits.  No active suicidal thoughts today.  Requesting to get back on  his lithium and get stable.  No homicidal thoughts.  No evidence of  flight of ideas or internal distractions.  Impulse control and judgment  within normal limits.   AXIS I:  1. Bipolar disorder, NOS.  2. Cocaine abuse.  AXIS II:  Deferred.  AXIS III:  No diagnosis.  AXIS IV:  Moderate chronic problems with the social environment,  relationship issues.  AXIS V:  Current 42, past year 101.   ESTIMATED PLAN:  Voluntarily admit him with a goal of alleviating his  homicidal thoughts towards his girlfriend and his suicidal thoughts, all  of which he feels are resolving today.  We are going to restart his  lithium at 600 mg with the  noon meal today then 600 mg b.i.d.  We will  recheck a lithium level on Oct 12, 2008.  Meanwhile, he is enrolled in  our dual diagnosis program and we will see if we can get his family  involved.      Margaret A. Scott, N.P.      Geoffery Lyons, M.D.  Electronically Signed    MAS/MEDQ  D:  10/10/2008  T:  10/10/2008  Job:  [id number]

## 2010-10-08 NOTE — Discharge Summary (Signed)
NAMEGEORGIA, BARIA               ACCOUNT NO.:  [id number]   MEDICAL RECORD NO.:  [id number]          PATIENT TYPE:  IPS   LOCATION:  0306                          FACILITY:  [facility]   PHYSICIAN:  Jasmine Pang, M.D. DATE OF BIRTH:  May 31, 1960   DATE OF ADMISSION:  10/14/2008  DATE OF DISCHARGE:  10/24/2008                               DISCHARGE SUMMARY   IDENTIFICATION:  This is a 50 year old single African American male who  was admitted on a voluntary basis on Oct 14, 2008.   HISTORY OF PRESENT ILLNESS:  The patient presents with a history of an  intentional overdose on Ambien and 4 Vistaril tablets after breakup with  his girlfriend.  He is feeling very hopeless and helpless about his  living situation.  He was feeling very tired.  The patient has been  feeling suicidal and having homicidal ideation toward no one in  particular.  He has no history of any violence.  He feels that he was  dumped off in a drug infested living facility.  He states that he has  been resorting to using alcohol and drugs he overdosed.  He feels now  that he is ready for long-term rehab or may go live with his mother.  The patient has had multiple admissions to our facility within the past  2 weeks.  Upon admission, his axis I disorders included mood disorder,  not otherwise specified and polysubstance abuse in partial remission.  On axis III, there were no known medical conditions.  For further  admission information, see psychiatric admission assessment.   PHYSICAL FINDINGS:  This is a well-nourished male, fully assessed in the  emergency department where he states he was agitated and disheveled.  He  received some Ativan during that day.  There were no acute physical or  medical problems noted.   LABORATORY DATA:  Showed glucose of 102 and a urine drug screen was  negative.   HOSPITAL COURSE:  Upon admission, the patient was started on Ambien 10  mg p.o. q.h.s., p.r.n. insomnia and lithium 500 mg  p.o. b.i.d., as well  as Vistaril 50 mg p.o. daily.  In individual sessions, the patient was  initially alert and oriented x4.  He is very sad.  He regressed his  overdose.  He is depressed and anxious.  He denies suicidal ideation.  He stated he was confused and hopeless.  On Oct 16, 2008, the patient  continues to be suicidal.  He was depressed and anxious.  His sleep was  poor.  He was hyperverbal with some pressured speech, though this was in  fairly good control and not interfering with his functioning.  He  continued to feel depressed and anxious.  His lithium dose was increased  to 600 mg p.o. b.i.d.  He was also started on BuSpar 10 mg p.o. b.i.d.  On Oct 17, 2008, the BuSpar was discontinued because he felt it made him  too sleepy.  Instead, he was started on Prozac 20 mg p.o. daily.  He  states that it had helped his anxiety greatly in the past.  On Oct 19, 2008, he was grieving the loss of his girlfriend and talked to the  mother yesterday.  He was hopeful that he would be able to go home to  live with her.  He felt since he had not used any drugs that she would  allow him to return home.  As hospitalization progressed, he became less  depressed, less anxious.  On Oct 21, 2008, there was no suicidal  ideation.  His mother had said he could come home to live with them and  he was very excited about this.  He also talked about [place] to [place]  in [place], he has an uncle.  He felt this may be a good move  for him.  On October 24, 2008, mental status had improved markedly from  admission status.  The patient was less depressed, less anxious.  His  sleep was good.  His appetite was good.  There was no suicidal or  homicidal ideation.  No thoughts of self-injurious behavior.  Thoughts  were logical and goal-directed.  Thought content, no predominant theme.  Cognitive was grossly intact.  Insight good.  Judgment good.  Impulse  control good.  The patient wanted to go home  today to live with his  mother.  He was felt to be safe for discharge.   DISCHARGE DIAGNOSES:  Axis I:  Mood disorder, not otherwise specified,  polysubstance substance abuse, and partial remission.  Axis II:  None.  Axis III:  No known medical conditions.  Axis IV:  Severe (problems with housing, other psychosocial problems  related to support, burden of psychiatric illness, and burden of history  of polysubstance abuse).  Axis V: Global assessment of functioning 50 upon discharge.  GAF was 35-  40 upon admission.  GAF was 60-65 highest past year.   DISCHARGE PLAN:  There are no specific activity level or dietary  restrictions.   POSTHOSPITAL CARE PLANS:  The patient will go to the [facility] on  October 31, 2008, at 2 p.m.   DISCHARGE MEDICATIONS:  1. Vistaril 50 mg daily.  2. Prozac 20 mg daily.  3. Lithium 600 mg twice daily.      Jasmine Pang, M.D.  Electronically Signed     BHS/MEDQ  D:  10/24/2008  T:  10/25/2008  Job:  [id number]

## 2010-10-08 NOTE — Discharge Summary (Signed)
[name], [name]               ACCOUNT NO.:  [id number]   MEDICAL RECORD NO.:  [id number]          PATIENT TYPE:  IPS   LOCATION:  0505                          FACILITY:  [facility]   PHYSICIAN:  Anselm Jungling, MD  DATE OF BIRTH:  [birth year]   DATE OF ADMISSION:  09/15/2007  DATE OF DISCHARGE:  09/20/2007                               DISCHARGE SUMMARY   IDENTIFYING DATA/REASON FOR ADMISSION:  The patient is a 50 year old  single African American male, and this is one of several admissions he  has had for inpatient psychiatric service.  He returned to [place] with a  history of substance abuse and bipolar disorder.  He had recently been  released from treatment at [facility].  Upon getting home, he immediately got  into conflicts with his family members, stopped taking his medication,  and subsequently verbalized a plan to overdose with his remaining  prescription.  Please refer to the admission note for further details  pertaining to the symptoms, circumstances and history that led to his  hospitalization.  He was given an initial Axis I diagnosis of  polysubstance abuse, polysubstance dependence, and history of bipolar  disorder.   MEDICAL/LABORATORY:  The patient was medically and physically assessed  by the psychiatric nurse practitioner.  He was in generally good health  without any active or chronic medical problems except for seasonal  allergies, for which she was given Claritin.   HOSPITAL COURSE:  The patient was admitted to the adult inpatient  psychiatric service.  He presented as a well-nourished, well-developed  male who was alert, fully oriented, pleasant and cooperative.  His mood  was depressed, but he denied any suicidal ideation.  He verbalized a  strong desire for help.  There were no signs or symptoms of psychosis or  thought disorder or delusional statements.  He agreed to restarting his  usual medications.  He was open to placement such as he might find at an  [facility].  He was restarted on lithium, Neurontin, and liver  function was monitored as well as kidney function.  These were within  normal limits.   The patient was involved in therapeutic groups and activities including  those geared towards 12-step recovery.  The patient showed a very strong  commitment to ongoing sobriety.  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  On the 6th hospital day, he appeared appropriate for  discharge.  He agreed to the following aftercare plan.   AFTERCARE:  The patient was to follow-up at the [facility] with an  appointment with their psychiatrist on September 23, 2007.   DISCHARGE MEDICATIONS:  1. Lithium 600 mg b.i.d.  2. Neurontin 100 mg q.i.d.  3. Claritin 10 mg daily.   DISCHARGE DIAGNOSES:  AXIS I:  Bipolar disorder by history,  polysubstance dependence, early remission.  AXIS II:  Deferred.  AXIS III:  Seasonal allergies.  AXIS IV:  Stressors severe.  AXIS V: Global assessment of functioning (GAF) on discharge 60.      Anselm Jungling, MD  Electronically Signed  SPB/MEDQ  D:  09/23/2007  T:  09/23/2007  Job:  [id number]

## 2010-10-08 NOTE — Discharge Summary (Signed)
NAMEAUSTIN, [name]               ACCOUNT NO.:  [id number]   MEDICAL RECORD NO.:  [id number]          PATIENT TYPE:  IPS   LOCATION:  0507                          FACILITY:  [facility]   PHYSICIAN:  Geoffery Lyons, M.D.      DATE OF BIRTH:  02/23/1961   DATE OF ADMISSION:  02/05/2008  DATE OF DISCHARGE:  02/18/2008                               DISCHARGE SUMMARY   CHIEF COMPLAINT AND PRESENT ILLNESS:  This is one of multiple admissions  to [facility] for this 50 year old single African  American male.  He was just discharged September 9.  The placement  apparently did not work out for him.  He returned to crisis after  arguing with the group house manager or signing in and making financial  arrangements for payment.  He requested help but he was feeling that he  was losing and did not want to relapse.   PAST PSYCHIATRIC HISTORY:  September 3 to September 9 was the last  admission.  Prior to that August 2 to August 20, May 2 to May 8 and  April 22 to April 27.   SOCIAL HISTORY:  Persistent use of cocaine although claimed abstinence  and UDS is negative.   MEDICAL HISTORY:  Chronic pain.   MEDICATIONS:  1. Lithium citrate 15 mEq in the morning and at night.  2. Vistaril 50 at night.   PHYSICAL EXAMINATION:  Physical examination did not show any acute  findings.   LABORATORY WORK:  Lithium less than 0.25.   MENTAL STATUS EXAM:  Reveals alert cooperative male appropriately  groomed, dressed and nourished.  Speech was in normal rate, rhythm and  tone.  Mood was irritable, angry, able to calm down, easily agitated.  Affect agitated, is easily upset.  Thought process were clear, rational  and oriented.  Some pressure.  No active delusions.  No hallucinations.  Cognition well-preserved.   AXIS I: Bipolar disorder, cocaine dependence in early remission.  AXIS II: No diagnosis.  AXIS III:  No diagnosis.  AXIS IV: Moderate.  AXIS IV:  Upon admission 35, highest GAF in  the last year 60.   COURSE IN THE HOSPITAL:  He was admitted, was started in individual and  group psychotherapy.  As already stated he endorsed that he came before  it was to late and he would relapse.  The placement did not work out.  September 15, he had evidence of racing thoughts, pressured speech,  perseveration, ruminations about what happened, irritability, anger and  agitation.  Endorsed suicidal and homicidal ideations.  If insurance was  not to pay for him to be in the unit and thus he needed to go to [facility].  We worked to stabilize and optimize treatment with lithium.  He continued to have pressured speech, racing thoughts, decreased sleep.  We continued to adjust the medication.  He was pretty labile, building  up to becoming very agitated, very low tolerance for frustration. He  reported a lot of side effects to the other mood stabilizer and was  wanting to work with the lithium  further.  We worked on Pharmacologist,  Optician, dispensing.  He did endorse that the liquid lithium was giving  him nausea.  Lithium level was 0.49.  He was wanting to switch as he was  compliant.  He said that he needed the lithium, he was the one like  agreeing to take it.  He continued to deal with the racing thoughts,  irritability.  He was trying to find the logistic support to go from  here.  We switched from lithium citrate to lithium carbonate.  He work  on this change as he felt that the citrate was affecting his stomach.  We pursued switching and adjusting the dose. As he was more hyperthymic  he started coming down, becoming more down and depressed, easily upset  with the outside situation, wanting placement options.  Continued to  have a hard time the next 48 hours, somewhat labile.  Continued to  experience racing thoughts but tolerating the lithium well.  We  continued to optimize treatment.  He admitted to a lot of worries and  ruminations about what was going to happen once he  was discharged.  We  continued to stabilizer.  On September 25 he was in full contact with  reality, marked improved, compliant with medications.  Endorsed that he  was committed to abstinence so we went ahead and discharged to  outpatient follow-up.   AXIS I: Bipolar disorder most recently manic.  Cocaine and alcohol  dependence in early remission.  AXIS II: No diagnosis.  AXIS III:  No diagnosis.  AXIS IV: Moderate.  AXIS V:  On discharge 50 to 55.   Discharged on lithium CR 450 mg 2 tablets in the morning, 2-1/2 tablets  at bedtime.  Clonidine 10 mg per day.  Followup with Dr. Lang Snow at  [facility] and Delfin Edis at [facility].      Geoffery Lyons, M.D.  Electronically Signed     IL/MEDQ  D:  03/07/2008  T:  03/08/2008  Job:  [id number]

## 2010-10-08 NOTE — Discharge Summary (Signed)
NAMEJAYTHEN, [name]               ACCOUNT NO.:  [id number]   MEDICAL RECORD NO.:  [id number]          PATIENT TYPE:  IPS   LOCATION:  0306                          FACILITY:  BH   PHYSICIAN:  Geoffery Lyons, M.D.      DATE OF BIRTH:  1960/09/10   DATE OF ADMISSION:  10/09/2008  DATE OF DISCHARGE:  10/13/2008                               DISCHARGE SUMMARY   CHIEF COMPLAINT/HISTORY OF PRESENT ILLNESS:  This was the 7th admission  to [facility] for this 50 year old, male single,  voluntarily admitted.  Presented after relapsing on some cocaine.  This  past week, before the admission, was in his girlfriend's house and was  arguing with her.  She called the police to report that he was suicidal,  with a plan to jump in front of a car.  Also had some suicide ideations  for his ex-girlfriend, which he has denied at this assessment.  Drinking  alcohol, two or three beers a day, living on the streets since he got  into argument with his mother and his sister, not sleeping regularly.  Claimed he was eating out of a trash can.   PAST PSYCHIATRIC HISTORY:  Seventh admission in the past 12 months, with  a history of bipolar disorder and mood fluctuation, elevation  characterized by expansive grandiosity.  Had taking lithium fairly  steadily.  He is not taking any of his medications since he was last  discharged on September 22, 2008.   BACKGROUND DRUG HISTORY:  The patient with the persistent use of cocaine  and alcohol, how long is not clear.  Noncompliant with his outpatient  treatment.   MEDICAL HISTORY:  Noncontributory.   MEDICATIONS:  1. Previously on lithium 600 mg twice a day.  2. Ambien rest.  3. Risperdal which he is not taking.   PHYSICAL EXAMINATION:  Failed to show any acute findings.   LABORATORY WORK:  Lithium level less than 0.25.  TSH within normal  limits.  Electrolytes unremarkable.  Normal liver enzymes.  CBC within  normal limits.   MENTAL STATUS EXAM:   Reveals a fully alert cooperative male, with mood  anxious, affect anxious.  Thought processes logical, coherent and  relevant.  Endorsed he wants to get himself back together, back to  having a place to live, not knowing where to go from here.  His thought  processes are logical, coherent and relevant.  No delusions, no  hallucinations, no suicidal ideas.  Cognition is well-preserved.   DIAGNOSES:  AXIS I:  1.  Bipolar disorder.  1. Cocaine and alcohol abuse, rule out dependence.  AXIS II:  No diagnosis.  AXIS III:  No diagnosis.  AXIS IV:  Moderate.  AXIS V:  GAF on admission 35, the GAF in the last year 60.   COURSE IN THE HOSPITAL:  He was admitted and started in individual and  group psychotherapy.  We went ahead and started detoxing him and placed  him back on his medication.  Claimed he ran out of his medication and  never went for a refill.  His  Depakote level was less than 10.  Lithium  level less than 0.025.  __________  and insight:  Projecting a lot of  guilt and rationalizing.  On May 19th he was exhibiting pressured  speech, ruminative expansive plan to get his life back together.  Wanted  to go to an assisted living facility.  Focused on that.  He claimed that  he was going to comply with his medications.  He was given Ambien, and  to be started on lithium 600 mg twice a day.  On May 20th, the Lithium  level was 0.42.  He was hyperthymic, moderately euthymic, decreased and  pressured speech, less impulsive.  We continued lithium, worked on  Pharmacologist, relapse prevention.   By May the 21st, had identified a place he could go.  No active  withdrawal, no active suicidal ideas, no delusions,  no hallucinations.  His mood was more euthymic, so he was discharged to outpatient followup.   DISCHARGE DIAGNOSES:  AXIS I:  1.  Bipolar disorder.  1. Cocaine and alcohol abuse.  AXIS II:  No diagnosis.  AXIS III:  No diagnosis.  AXIS IV:  Moderate.  AXIS V:  On discharge GAF  50 to 55. d   DISCHARGE MEDICATIONS:  1. Discharged on lithium 600 mg twice a day.  2. Vistaril 25 mg every four hours as needed for anxiety.  3. Ambien 10 mg at bedtime for sleep.   FOLLOWUP:  To follow up with Dr. Lang Snow at [facility].      Geoffery Lyons, M.D.  Electronically Signed     IL/MEDQ  D:  11/02/2008  T:  11/02/2008  Job:  [id number]

## 2010-10-08 NOTE — Discharge Summary (Signed)
NAMEBRYANN, MCNEALY               ACCOUNT NO.:  [id number]   MEDICAL RECORD NO.:  [id number]          PATIENT TYPE:  IPS   LOCATION:  0504                          FACILITY:  BH   PHYSICIAN:  Geoffery Lyons, M.D.      DATE OF BIRTH:  12/28/1960   DATE OF ADMISSION:  01/27/2008  DATE OF DISCHARGE:  02/02/2008                               DISCHARGE SUMMARY   CHIEF COMPLAINT:  This was one of multiple admissions to [facility] for this 50 year old African American who male  presented to [place] brought by the police.  He left his placement  in [facility] to go visit a girlfriend.  The girlfriend would not take  him back and he became suicidal and homicidal.  He reported that he  would cut his throat with the edges of a broken bottled or overdose.  He  just endorsed he just does not know how long he can continue to live  like this.  Relapsed on cocaine and alcohol, used $700 worth of cocaine.  Has been using alcohol for the last 2 days all day long.  UDS was  positive for cocaine.  Lithium level was less than 0.25 last admission  August 12 to hold the 20th.   PAST PSYCHIATRIC HISTORY:  Multiple stays at [facility] and  supposedly followed up at [facility], poor compliance.   MEDICAL HISTORY:  Claims chronic pain.   ALCOHOL AND DRUG HISTORY:  Persistent use of cocaine and alcohol.  No  significant long-term sobriety.   MEDICATIONS:  1. He was on Neurontin 100 four times a day.  2. Lithium citrate 5 mL in the morning, 10 mL at night.  3. Ambien 10 at bedtime for sleep.   Physical exam failed to show any acute findings.   LABORATORY WORK:  Lithium 0.27.  UDS positive for cocaine.   MENTAL STATUS EXAM:  A male who is alert, appropriately groomed and  dressed and nourished.  Good eye contact.  Speech is soft but otherwise  normal rate, rhythm and tone.  Mood is appropriate, mostly depressed.  Thought processes are clear, rational and oriented.   Affect is  depressed.  Endorsed no active suicidal or homicidal ideas at the time  of this assessment.  Endorsed that he wanted to his life back together.  No evidence of delusions, hallucinations.  Cognition well-preserved.   ADMISSION DIAGNOSES:  AXIS I:  Bipolar disorder.  Cocaine and alcohol  abuse.  AXIS II: No diagnosis.  AXIS III:  Chronic pain.  AXIS IV: Moderate.  AXIS V:  Upon admission 45, highest GAF in the last year 60.   COURSE IN THE HOSPITAL:  Was admitted.  He was started in individual and  group psychotherapy.  He was given some __________ for sleep.  We got  him back on the Ambien.  Wanted the Vistaril rather than the Neurontin.  He was placed on Symmetrel for craving.  He claimed he was extremely  depressed, had suicidal thoughts before he came in, craving for cocaine,  poor sleep.  Planning to go  to a [place] housing facility.  Did not  want to go back to [facility].  September 7 he was labile, tearful,  anxious, easily upset, wanting a different placement, feeling frustrated  with some suicidal thoughts as things were not moving as far as he felt  they should move.  September 8 continued to focus on that that he wanted  to go to this church-run housing program.  As they also provide services  counseling, we adjusted the lithium to try to get at the appropriate  level and on September 9 he was in full contact with reality.  He had to  go back to [facility], get situated there, get his things, and  eventually made it to this other apartment program where when he could  have the necessary funds to give a deposit.  But he was in full contact  reality, endorsing no active suicidal or homicidal ideations.  Did  endorse that he was not going to pursue a relationship with a male as  usually he was leading back to the same situation.   DISCHARGE DIAGNOSES:  AXIS I:  Bipolar disorder.  Cocaine abuse.  Alcohol abuse.  AXIS II:  No diagnosis.  AXIS III:  Chronic  pain.  AXIS IV: Moderate.  AXIS V:  On discharge 50.   Discharged on:  1. Symmetrel 100 twice a day.  2. Lithium citrate __________ in the morning, 16 mg at night, the      equivalent of 1200 mg of lithium.   FOLLOWUP:  [facility].      Geoffery Lyons, M.D.  Electronically Signed     IL/MEDQ  D:  02/08/2008  T:  02/10/2008  Job:  [id number]

## 2010-10-08 NOTE — Discharge Summary (Signed)
NAMEKENDON, [name]               ACCOUNT NO.:  [id number]   MEDICAL RECORD NO.:  [id number]          PATIENT TYPE:  IPS   LOCATION:  0502                          FACILITY:  [facility]   PHYSICIAN:  Jasmine Pang, M.D. DATE OF BIRTH:  August 20, 1960   DATE OF ADMISSION:  11/28/2006  DATE OF DISCHARGE:  12/04/2006                               DISCHARGE SUMMARY   IDENTIFICATION:  This is a voluntary admission on November 28, 2006 for this  50 year old single African-American male who presented here as a walk-in  the day of admission.   HISTORY OF PRESENT ILLNESS:  The patient reported having been  noncompliant with his prescribed medications for a week or two.  He was  feeling depressed, had suicidal ideation with a plan to cut himself and  was hearing his grandmother's voice.  He stated that his girlfriend had  broken up with him.  His mother wants him to have treatment for relapse  on alcohol.  His UDS results were not available as he never submitted a  urine specimen.  His other labs were within normal limits.  His TSH was  0.687 which was within normal limits.  Lithium level was low at 0.25  consistent with his reported noncompliance.  CBC and hepatic profile had  no abnormal findings.  Glucose was slightly elevated at 104.  The  patient has been with [place] a number of times.  His most recent visit being  July 26, 2005 to July 29, 2005.  At that point, he had had a fight with  his ex-girlfriend.  He had been kicked out of his house.  He had also  been noncompliant with his medications then.  He states his father has a  history of alcohol abuse.  The patient has no primary care Margan Elias and  wants to get reestablish with the [facility]  for psychiatric follow-up.  He has no known medical problems.   MEDICATIONS:  He states he has been taking lithium, Vistaril, Ambien and  Valium.  However, he last had these prescriptions filled in [place]. and does not know the  name of the pharmacy offhand.   ALLERGIES:  He has no known drug allergies.   PHYSICAL EXAMINATION:  Well-developed, well-nourished African-American  male who appears his stated age.  He had no remarkable abnormal physical  findings.  His review of systems was unremarkable.   LABORATORY DATA:  See above paragraph in history of present illness.   HOSPITAL COURSE:  Upon admission, the patient was placed on lithium  carbonate 300 mg p.o. t.i.d. and Risperdal 0.5 mg p.o. q.h.s.  He was  also given Vistaril 25 mg p.o. q.6h. p.r.n. anxiety.  On November 30, 2006,  the Vistaril was stopped.  He was started on Neurontin 100 mg p.o.  t.i.d. for anxiety.  On December 03, 2006, Neurontin was increased to 100 mg  four times daily instead of t.i.d.  The patient tolerated his  medications well with no significant side effects.  An a.m. lithium  level was ordered but was not available at the  time of this dictation.  During the hospitalization, the patient was friendly and cooperative.  He stated his girlfriend had broken up with him which caused him  distress.  He felt hopeless.  His sleep and appetite were poor.  He had  positive suicidal ideation but was able to contract for safety here.  He  states he could hear voices.  He hears his deceased grandmother's  voices.  He continued to be depressed.  The appetite improved somewhat,  continued to feel suicidal, stated he had auditory hallucinations  stating I'm a loser.  The patient was able to participate  appropriately in unit therapeutic groups and activities.  He enjoyed the  groups and began to feel better.  As his hospitalization progressed,  mood revealed a decrease in depression and anxiety and hopelessness.  He  still had mild suicidal ideation this was improving.  He was able to  contract for safety.  On December 04, 2006, the patient's mental status had  improved markedly from admission status.  Mood was euthymic.  Affect  wide range.  There was no  suicidal or homicidal ideation.  No thoughts  of self-injurious behavior.  No auditory or visual hallucinations.  No  paranoia or delusions.  Thoughts were logical and goal-directed.  Thought content no predominant theme.  Cognitive was grossly within  normal limits and it was felt the patient was safe to be discharged  today home to live with his mother.   DISCHARGE DIAGNOSES:  AXIS I:  Bipolar disorder, depressed with  psychotic features.  AXIS II:  Personality disorder not otherwise specified.  AXIS III:  None.  AXIS IV:  Moderate (problems with primary support group, housing and  economic issues).  AXIS V:  GAF at discharge 50; GAF at admission 25; GAF highest past year  55-60.   ACTIVITY/DIET:  There were no specific activity level or dietary  restrictions.   POST-HOSPITAL CARE PLANS:  The patient will be seen at [facility] for  substance abuse assessment and medications on December 07, 2006 at 9 a.m.   DISCHARGE MEDICATIONS:  1. Lithium carbonate 300 mg p.o. t.i.d.  2. Risperdal 0.5 mg at bedtime.  3. Neurontin 100 mg q.i.d.  4. Ambien 10 mg at bedtime if needed for insomnia.      Jasmine Pang, M.D.  Electronically Signed     BHS/MEDQ  D:  12/04/2006  T:  12/05/2006  Job:  [id number]

## 2010-10-08 NOTE — H&P (Signed)
[name], [name]               ACCOUNT NO.:  [id number]   MEDICAL RECORD NO.:  [id number]          PATIENT TYPE:  IPS   LOCATION:  0504                          FACILITY:  [facility]   PHYSICIAN:  Vic Ripper, P.A.-C.DATE OF BIRTH:  08/22/1960   DATE OF ADMISSION:  01/27/2008  DATE OF DISCHARGE:                       PSYCHIATRIC ADMISSION ASSESSMENT   HISTORY OF PRESENT ILLNESS:  This is a 50 year old single African-  American male. He presented to the [facility]  emergency department. He was brought there by the police. He had left  his placement at [facility] to go visit a girlfriend. The girlfriend  would not take him back and hence, he became suicidal and homicidal. He  reported that he would cut his throat with the edges of a broken bottle  or OD. He states he just does not know how long he can continue to live  like this. He also reports that he relapsed on cocaine and alcohol. He  used $700.00 worth of cocaine and he reports alcohol for the past 2  days, all day long. His lab work shows that he had an alcohol level of  49. His UDS was positive for cocaine. Is plain urinalysis has blood in  his urine and his lithium level is subtherapeutic at 0.25. [name] was  last with [place] January 05, 2008 to January 13, 2008.   SOCIAL HISTORY:  Is unchanged at this point.   FAMILY HISTORY:  Remarkable in that his father was also an alcoholic.   PRIMARY CARE PHYSICIAN:  None has been identified yet. He has had no  outside psychiatric care yet.   PAST MEDICAL HISTORY:  Medical problems include chronic pain.   MEDICATIONS:  When he was discharged back on January 13, 2008, he was on:  1. Neurontin 100 mg p.o. q.i.d.  2. Lithium carbonate syrup 5 cc in the morning, 10 cc at bedtime.  3. Ambien 10 mg at bedtime p.r.n. insomnia.   ALLERGIES:  NO KNOWN DRUG ALLERGIES.   POSITIVE PHYSICAL FINDINGS:  GENERAL:  He was medically cleared in the  emergency department at the  hospital. He had no remarkable physical  findings or complaints. His situation is well documented and well known.  VITAL SIGNS:  On admission show he is 6 foot 1. Weighs 190. Temperature  97.6, blood pressure 107/60 to 96/57, pulse is 69 to 72, and respiratory  rate is 18.   MENTAL STATUS EXAM:  Tonight he is alert and oriented. He is  appropriately groomed, dressed, and nourished. He walks un-aided. He has  good eye contact. Speech is soft but otherwise is normal rate and  rhythm. His mood is appropriate to the situation. His thought processes  are clear, rational and goal oriented. Judgment and insight appear to be  fair. Concentration and memory are good. Intelligence is average. He  reports that he is still feeling suicidal. He denies any homicidal  ideation and he attests that he has auditory hallucinations. This  consists of hearing negative voices telling him to kill himself. This is  because he has failed once again and  once again has lost his immediate  support.   DIAGNOSES:  AXIS I:     Cocaine and alcohol abuse. History for bipolar  NOS. Lithium level is subtherapeutic.  AXIS II:    Rule out borderline IQ.  AXIS III:   Hematuria, unexplained. Chronic pain.  AXIS IV:    Relational and housing.  AXIS V:     25.   PLAN:  Admit for safety and stabilization. Will try to get his lithium  therapeutic. We will once again work on placement. He feels confident  that he can go back to his former placement at [facility].   ESTIMATED LENGTH OF STAY:  Three (3) days.      Vic Ripper, P.A.-C.     MD/MEDQ  D:  01/27/2008  T:  01/27/2008  Job:  [id number]

## 2010-10-08 NOTE — Discharge Summary (Signed)
[name], [name]               ACCOUNT NO.:  [id number]   MEDICAL RECORD NO.:  [id number]          PATIENT TYPE:  IPS   LOCATION:  0506                          FACILITY:  [facility]   PHYSICIAN:  Jasmine Pang, M.D. DATE OF BIRTH:  1960-08-08   DATE OF ADMISSION:  09/15/2008  DATE OF DISCHARGE:  09/22/2008                               DISCHARGE SUMMARY   IDENTIFICATION:  This is a 50 year old single African American male who  was admitted on a voluntary basis on September 15, 2008.   HISTORY OF PRESENT ILLNESS:  The patient presented to the [facility]  ED.  He reported that he had been experiencing suicidal ideation for the  past 24 hours with plans to jump in front of a car, cut his wrists.  He  reports a breakup with his girlfriend and become noncompliant with his  medication.  He came in before he relapsed.  UDS is positive only for  benzodiazepines and he may have been given this in the ED.  For further  admission information, see psychiatric admission assessment.   Upon admission the patient was given:  Axis I:  Diagnosis of bipolar disorder, not otherwise specified and it  was acknowledged that he has a history of alcohol and cocaine abuse with  unspecified benzodiazepines in his urine drug screen today.  Axis III:  There were no medical problems.   PHYSICAL FINDINGS:  There were no acute physical or medical problems  noted.  He was evaluated at the ED prior to admission.  He has old  superficial lacerations mostly on his firearms, although a breakup with  his girlfriend he had started cutting of his left thigh.   HOSPITAL COURSE:  Upon admission, the patient was started on Ambien 10  mg p.o. q.h.s. may repeat x1 and Vistaril 50 mg p.o. q.6 h. p.r.n.  anxiety.  He was also started on lithium carbonate 600 mg p.o. q.a.m.  and q.h.s. since this is what he has been on in the past.  He was also  started on Depakote ER 500 mg p.o. q.h.s., which was later changed to  q.a.m. since  he felt this would help calm him.  In individual sessions,  the patient was initially restless with fast pressured speech and he was  hyperverbal.  He was euphoric and also somewhat anxious.  Affect was  labile.  There was no evidence of a fall disorder or psychosis.  As  hospitalization progressed, he was becoming less euphoric and less  expansive.  He was becoming somewhat depressed.  He was having suicidal  ideation I feel hopeless.  He was also having positive auditory  hallucinations you are a loser.  His Depakote was discontinued on  2008/09/25.  He was grieving the death of his grandmother 1 year ago  and having grief over the loss of his girlfriend recently.  He states he  returned to [place] a week ago for [employer] service 1 year after his  grandmother's death.  His mood was improved.  He became less  hyperverbal.  His concentration was adequate.  He  was less restless.  On  September 22, 2008, mental status had improved markedly from admission  status.  The Depakote had been stopped due to GI distress.  His mood was  euthymic.  He was having no dangerous thoughts.  There was no evidence  of psychosis or thought disorder.  Affect was appropriate.  He was alert  and stable, and felt that he wanted to go home.  There was no  grandiosity and speech was normal.  He appeared to be at his baseline,  since we known him from before.  The patient wanted to be discharged and  it was felt, he was safe to go home.   DISCHARGE DIAGNOSES:  Axis I:  Bipolar disorder type 1, mixed and  severe.  History of polysubstance abuse in the past.  He denies now.  Axis II:  None.  Axis III:  None.  Axis IV:  Severe (breakup with girlfriend, grief over death of  grandmother 1 year ago, and the burden of psychiatric illness).  Axis V:  GAF was 50 upon discharge.  GAF was 35 upon admission.  GAF  highest past year was 60-65.   DISCHARGE PLANS:  There were no specific activity level or dietary   restrictions.   POSTHOSPITAL CARE PLANS:  The patient will go to the [facility] on  Oct 05, 2008 at 8:30 a.m.   DISCHARGE MEDICATIONS:  1. Protonix 40 mg daily.  2. Lithium 300 mg 2 pills twice a day.  3. Ambien 10 mg at bedtime.   His last lithium level was 0.59 on current dose on September 20, 2008.  His  TSH was normal at 3.599.      Jasmine Pang, M.D.  Electronically Signed     BHS/MEDQ  D:  10/05/2008  T:  10/06/2008  Job:  [id number]

## 2010-10-08 NOTE — Discharge Summary (Signed)
[name], [name]               ACCOUNT NO.:  [id number]   MEDICAL RECORD NO.:  [id number]          PATIENT TYPE:  IPS   LOCATION:  0502                          FACILITY:  [facility]   PHYSICIAN:  Geoffery Lyons, M.D.      DATE OF BIRTH:  12/27/1960   DATE OF ADMISSION:  03/28/2007  DATE OF DISCHARGE:  04/06/2007                               DISCHARGE SUMMARY   CHIEF COMPLAINT AND PRESENT ILLNESS:  This was one of multiple  admissions to [facility] for this 50 year old African  American male single, voluntarily admitted.  He relapsed on alcohol and  cocaine 1-2 weeks prior to this admission.  Apparently his girlfriend  killed herself by overdosing. They had been living together for about 3  weeks since his mother evicted him from the home.  He was currently upon  this evaluation, drinking about a 12-pack of alcohol most days for the  past 2 weeks and smoking crack cocaine continuously every day for the  past 7 days. Prior to this, reports 3 months worth of abstinence had  been attending the [facility]. Claims suicidal thoughts of wanting to  cut his wrist or jump in front of a car.   PAST PSYCHIATRIC HISTORY:  Follows at the [facility] by [facility]  and __________ Last admission to [facility] July 5 to  11th 2008.  Diagnosed bipolar with polysubstance dependence.  Previously  on lithium 300 three times a day, Risperdal 0.5 at night, Neurontin 100  four times a day, Ambien 10 at bedtime for sleep.   ALCOHOL/DRUG HISTORY:  As already stated, persistent use of substances  was recently relapsed on alcohol and crack cocaine.   PHYSICAL EXAMINATION:  Performed and failed to show any acute findings.   LABORATORY WORK:  White blood cell 4.2, hemoglobin 13.2, sodium 142,  potassium 3.6, glucose 130, BUN 25, creatinine 1.21, SGOT 98, SGPT 40,  total bilirubin 0.8, TSH 0.961. Lithium 0.40. Drug screen positive for  benzodiazepines and cocaine.   MEDICAL  HISTORY:  Noncontributory.   MEDICATIONS:  None in 2 weeks.   MENTAL STATUS EXAM:  Reveals alert, cooperative male.  Mood anxious,  depressed affect constricted.  Thought processes logical, coherent and  relevant.  Endorsed he relapsed overwhelmed with grief over the loss of  the girlfriend who overdose.  Hopeless, helpless, feeling shame for  having relapsed. Concerns about where to go from here. Suicidal  thoughts, plan to overdose or walk into traffic. No homicidal ideas, no  delusions.  No hallucinations.  Cognition well-preserved.   ADMISSION DIAGNOSES:  AXIS I: Mood disorder NOS.  Alcohol and cocaine  dependence.  AXIS II: No diagnosis.  AXIS III:  No diagnosis.  AXIS IV: Moderate.  AXIS V: Upon admission 35.  Highest GAF in the last year 65.   COURSE IN THE HOSPITAL:  Was admitted.  He was started in individual and  group psychotherapy.  He was detoxified with Librium. He was given  trazodone for sleep.  Trazodone was not effective.  He was placed on  Ambien.  We  eventually put him back on lithium 300 twice a day and  Neurontin 100 four times a day and then lithium was increased to 300  three times a day.  Finally the lithium level was optimized at 300 twice  a day and 600 at bedtime. As already stated he endorsed that the  girlfriend killed herself.  He was going to [facility] for 3 months  was staying abstinence, stated that he went to stay with his mother, he  claimed she kept bringing back his past, relapsed on alcohol and then  crack cocaine. Increased use for the last 7 days. Left his home, stayed  in motels, pretty much broke, feeling like he wanted to kill himself.  Claimed that he never felt this way before. The death of his girlfriend  has been devastating, sense of hopelessness and helplessness. Concerned  about being out there.  Endorsed no support he continued to have a hard  time by November 5 still depressed, continued to ruminate and be fixed  on that when  the girlfriend died he was left with no support. The mother  was ambivalent as far as allowing him to come back. We pursued the  detox.  We continued to optimize treatment with medication work on  relapse prevention as well as grief and loss. He continued to talk about  the loss of the girlfriend. He heard that the mother might be willing to  allow him back but he was resentful of the way she handled the money. In  the next 48 hours he started turning around. He was back on the  medication, the dosages were optimized. The mother was going to allow  him to be back with her. The medications were  starting to help and he  felt more hopeful.  He denied any active suicidal or homicidal ideations  so by November 11 we went ahead and discharged to outpatient follow-up.   DISCHARGE DIET:  AXIS I: Bipolar disorder, alcohol and cocaine  dependency.  AXIS II:  No diagnosis.  AXIS III:  No diagnosis.  AXIS IV: Moderate.  AXIS V:  GAF upon discharge 50-55.   DISCHARGE MEDICATIONS:  1. Neurontin 100 four times a day.  2. Lithium 300 mg twice a day and 600 at bedtime.  3. Ambien 10 at bedtime for sleep.   FOLLOW UP:  [facility].      Geoffery Lyons, M.D.  Electronically Signed     IL/MEDQ  D:  04/26/2007  T:  04/27/2007  Job:  [id number]

## 2010-10-08 NOTE — H&P (Signed)
[name], [name]               ACCOUNT NO.:  [id number]   MEDICAL RECORD NO.:  [id number]          PATIENT TYPE:  IPS   LOCATION:  0401                          FACILITY:  BH   PHYSICIAN:  Vic Ripper, P.A.-C.DATE OF BIRTH:  10/06/1960   DATE OF ADMISSION:  09/15/2007  DATE OF DISCHARGE:                       PSYCHIATRIC ADMISSION ASSESSMENT   This is a voluntary admission to the services of Dr. Milford Cage.   IDENTIFYING INFORMATION:  This is a 50 year old single African American  male.  He presented yesterday to prevent relapsing on drugs and alcohol.  He has recently been returned to [facility] after being treated at  [facility].  [name] was last an inpatient with [place] from 12/06 to 05/10/2007,  and since that time, he has been attending a variety of treatment  programs.  He denies relapsing on drugs or alcohol since then, and after  getting into conflict with family members over a week ago, he became  noncompliant with his medications.  His family was concerned because he  was becoming increasingly symptomatic, and they decided to come seek  admission prior to his actually relapsing.   He went to [facility] ED for medical clearance.  His UDS was negative.  He had no alcohol on board.  His lithium level was low at 0.25, and he  had no abnormal chemistries.   PSYCHIATRIC HISTORY:  As already stated, he has been with [place] a number of  times in the past, most recently from 05/01/2007 to 05/10/2007, and he  is currently unemployed.  He has never married.  He has no children.  He  does get SSI for mental illness.   FAMILY HISTORY:  Family history is remarkable in that his father was  also an alcoholic.   PAST MEDICAL HISTORY:  He does not have a primary care Ekaterina Denise.  He is  followed at [facility] when and if he goes by Dr. [name].   MEDICAL PROBLEMS:  There are no known medical illnesses at this point in  time.   DISCHARGE MEDICATIONS:  Medications at the  time of discharge in December  2008:  1. Lithium carbonate 600 mg b.i.d.  2. Neurontin 100 mg q.i.d.   ALLERGIES:  NO KNOWN DRUG ALLERGIES.   MENTAL STATUS EXAM:  Tonight, he is alert and oriented.  His speech is  not pressured, but he is hyperverbal.  His mood is actually somewhat  expansive.  He smiles frequently.  He is very happy.  His judgment and  insight are good.  Constitution and memory are intact.  Intelligence is  at least average.  He denies being suicidal or homicidal here in the  hospital.  He denies auditory or visual hallucinations, and he is very  pleased with himself that he did not relapse.   DIAGNOSIS:  AXIS I:  Bipolar I disorder with most recent episode being  depressed due to noncompliance.  He has prior substance abuse history.  AXIS II:  Deferred.  AXIS III:  No known medical illnesses.  AXIS IV:  Severe.  Issues with housing, economics, occupation.  AXIS V:  35.  PLAN:  The plan is to admit for safety and stabilization.  Toward that  end, we will restart his medications, specifically, lithium and  Neurontin that he was last prescribed, and we will work on placement  post discharge.   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   ESTIMATED LENGTH OF STAY:  5 to 7 day.      Vic Ripper, P.A.-C.     MD/MEDQ  D:  09/16/2007  T:  09/16/2007  Job:  [id number]

## 2010-10-08 NOTE — Discharge Summary (Signed)
[name], [name]               ACCOUNT NO.:  [id number]   MEDICAL RECORD NO.:  [id number]          PATIENT TYPE:  IPS   LOCATION:  0502                          FACILITY:  BH   PHYSICIAN:  Geoffery Lyons, M.D.      DATE OF BIRTH:  Oct 05, 1960   DATE OF ADMISSION:  11/13/2008  DATE OF DISCHARGE:  11/17/2008                               DISCHARGE SUMMARY   CHIEF COMPLAINT AND PRESENT ILLNESS:  This was one of multiple  admissions to [facility] for this 50 year old male that  came to the emergency room, endorsed that he had become despondent and  suicidal over his current life stressors, noncompliant with his  medications, started drinking and abusing cocaine.  Endorsed being very  depressed, being hopeless, claimed that his girlfriend was the trigger  for his relapse, resulted in homicidal thoughts, wanted long-term  treatment.   PAST PSYCHIATRIC HISTORY:  Multiple hospitalizations.  As of last year,  he had been admitted on August 12th through the 20th, September 3rd  through 9th, September 12th through the 25th,  October 9th through the  16th of 2009.  In 2010, April 23rd through 30th, May 17th through 21st  and May 22nd through October 24, 2008.   MEDICAL HISTORY:  Noncontributory.   MEDICATIONS:  Supposed to be taking lithium carbonate as well as  Vistaril.   PHYSICAL EXAMINATION:  Failed to show any acute findings.   LABORATORY WORKUP:  Results not in the chart.   MENTAL STATUS EXAMINATION:  Reveals an alert cooperative male.  Mood  depressed.  Affect depressed.  Some psychomotor retardation.  Endorsed  feeling very ashamed for being back.  No suicidal or homicidal ideas.  No delusions.  No hallucinations.  Cognition well-preserved.   ADMISSION DIAGNOSES:  AXIS I:  Alcohol and cocaine dependence.  Marijuana abuse.  Mood disorder not otherwise specified.  AXIS II:  No diagnosis.  AXIS III:  No diagnosis.  AXIS IV:  Moderate.  AXIS V:  On admission 35;  highest GAF in the last year 60.   COURSE IN THE HOSPITAL:  He was admitted, started in individual and  group psychotherapy.  We detoxified with Librium.  He was placed back on  lithium, his usual dose 600 mg twice a day.  He was also given some  Risperdal, Vistaril and Ambien.  As already stated, basically the same  presentation as the last time and the time before, issue with the  girlfriend led to the relapse.  He was kicked out from the [employer].  He had been from hospital to hospital.  Had a lot of  rationalization projection, assigning blame, no insight.  Wanted to go  to a long-term facility June 23, understands that there are no long-term  facilities, asylums, as he wanted to call them.  Encouraged to start  looking at placement options.  He expressed hopelessness and feelings of  inadequacy, keeps relapsing.  Endorsed that he looks at each episode as  a lost battle but not lost war.  Frustration that Medicaid had expressed  that they would  not continue paying for his hospitalizations.  Wanting  to go to [facility], wanting to go back to a hospital.  Looking to  options but wanting to go to [facility] to go to the rescue mission,  follow up with [employer].  June 25 was mostly  baseline, no acute suicidal or homicidal ideas, back on the lithium,  willing to coming to abstinence.   DISCHARGE DIAGNOSES:  AXIS I:  Alcohol and cocaine dependence.  Bipolar  disorder.  AXIS II:  No diagnosis.  AXIS III:  No diagnosis.  AXIS IV:  Moderate.  AXIS V:  On discharge 50.   Discharged on lithium 300 mg 2 twice a day.  Follow-up [facility] and Dr. Lang Snow at [facility].      Geoffery Lyons, M.D.  Electronically Signed     IL/MEDQ  D:  12/20/2008  T:  12/21/2008  Job:  [id number]

## 2010-10-08 NOTE — H&P (Signed)
NAMECZAR, YSAGUIRRE               ACCOUNT NO.:  [id number]   MEDICAL RECORD NO.:  [id number]          PATIENT TYPE:  IPS   LOCATION:  0303                          FACILITY:  BH   PHYSICIAN:  Jasmine Pang, M.D. DATE OF BIRTH:  May 20, 1961   DATE OF ADMISSION:  09/15/2008  DATE OF DISCHARGE:                       PSYCHIATRIC ADMISSION ASSESSMENT   This is a voluntary admission to the services of Dr. Milford Cage.   This is a 50 year old single [place] American male.  He presented to the  [facility] ED.  He reported that he had been experiencing suicidal  ideation for the past 24 hours with plans to jump in front of a car or  cut his wrist.  He reports another breakup with yet another girlfriend,  and he has become noncompliant with his meds again, but he came in  before he relapsed.  Indeed, his UDS is positive only for benzos.  He  may have been given this in the ED.  I do not have the complete record  in front of me.  At any rate, I do believe him, that he has not been  doing substances at this time.  He states that since his last admission  here, [place] was last with [place] October 9 to October 16th) he has been  staying with an uncle up in the [place] area.  He states that his girlfriend  broke up with him due to learning that he is bipolar.  This caused him  to become noncompliant with his meds, and he is back in the [place]  area to celebrate the 1-year anniversary of his grandmother's death.   PAST PSYCHIATRIC HISTORY:  He has been with [place] on numerous occasions.  Since September 15, 2007, he has had 7 admissions.  [facility] has also been at [facility].   SOCIAL HISTORY:  He just got back from [place].  He was living with his  uncle, doing speaking engagements.   FAMILY HISTORY:  His father is an alcoholic.   ALCOHOL/DRUG HISTORY:  Arsal usually is abusing alcohol and cocaine;  however, at this time, he is clean.   PRIMARY CARE Rylinn Linzy:  He does not have  one.   PSYCHIATRIST:  He was being followed up in [place].   MEDICAL PROBLEMS:  He does not have any.   MEDICATIONS:  When last with [place], and he is requesting that we restart  this, he was on lithium carbonate 600 mg q.a.m. and q.h.s., Vistaril 25  mg q.6h., and Ambien 10 mg q.h.s.   He has no known drug allergies.   PERTINENT PHYSICAL FINDINGS:  He is a well-developed and well-nourished  male who appears his stated age of 25.  He is denying any positives in  review of systems at this point in time.  VITAL SIGNS:  He is 6 feet 3.  He weighs 227.  His temperature is 97.5.  Blood pressure is 107/61 to 120/76.  Pulse was 65 to 70.  Respirations  18.  He has old self-inflicted superficial lacerations, mostly on his  forearms, although since the  breakup with his girlfriend, he had started  cutting to the left of his left eye, and he realized he needed to get  help.   MENTAL STATUS EXAM:  Tonight he is alert and oriented.  He is  appropriately groomed, dressed, and nourished.  His speech is increased  but not pressured.  His mood is elevated.  His affect is congruent.  His  thought processes are clear, rational, and drug-oriented.  He does not  want to relapse on drugs.  Judgment and insight are fair.  Concentration  and memory are intact.  Intelligence is at least average.  He reports  that he is still suicidal.  He states that Ginette Otto is a very big  trigger for relapse for him.  He is craving.  He denies homicidal  ideation, and he denies auditory or visual hallucinations.   DIAGNOSES:   AXIS I:  1. Bipolar.  Currently hypomanic due to noncompliance.  2. He historically abuses alcohol and cocaine, although none today.      He does have unexplained benzodiazepines in his urine drug screen.   AXIS II:  Old and somewhat new self-inflicted superficial lacerations.   AXIS III:  None.   AXIS IV:  He is currently living with his mother.   AXIS V:  Plan to admit for safety and  stabilization to get him restarted  on his medications.   Estimated length of stay is 3-5 days.      Mickie Leonarda Salon, P.A.-C.      Jasmine Pang, M.D.  Electronically Signed    MD/MEDQ  D:  09/15/2008  T:  09/16/2008  Job:  [id number]

## 2010-10-08 NOTE — Consult Note (Signed)
NAMEKELDON, [name]               ACCOUNT NO.:  [id number]   MEDICAL RECORD NO.:  [id number]          PATIENT TYPE:  EMS   LOCATION:  ED                           FACILITY:  [facility]   PHYSICIAN:  Antonietta Breach, M.D.  DATE OF BIRTH:  Sep 15, 1960   DATE OF CONSULTATION:  01/09/2009  DATE OF DISCHARGE:                                 CONSULTATION   REASON FOR CONSULTATION:  Depression, alcohol, and cocaine abuse.   REQUESTING PHYSICIAN:  Lobbyist.   HISTORY OF PRESENT ILLNESS:  [name] is a 50 year old male presenting  to the [facility] on August 16th with severe depression.   He has experienced 5 days of return of depressed mood, suicidal  ideation, and homicidal ideation.  He had just checked into a group home  2 days prior to presentation to the [facility].   He states that his girlfriend has broken up with him.  He has  hopelessness and helplessness as well as suicidal thoughts.   He is not having any hallucinations.  He has been restarted on his  lithium at a dosage of 300 mg b.i.d.  He is not having any side effects.   He has required Ativan 1 mg t.i.d. to control his anxiety.  He has  excessive worry, feeling on edge, and muscle tension.   He is not displaying any withdrawal symptoms.   He does have intact orientation and memory function, he is cooperative  with staff, he is not combative.   PAST PSYCHIATRIC HISTORY:  [name] does describe a history of more  than 5 days at a time when he was very young involving decreased need  for sleep, high-energy, racing thoughts, and feeling as if he was one  with the universe.  He was diagnosed with bipolar disorder at a young  age.   Other records have listed schizoaffective disorder, in review of the  past medical record.   He has been tried on atypical antipsychotics including Risperdal.  He  has undergone multiple psychiatric admissions to [facility]  for recurrent  depressive episodes along with cocaine and alcohol abuse.   He has never taken lithium consistently with a therapeutic blood level  for at least 4 weeks.   SOCIAL HISTORY:  Most recently, [name] was admitted to a group home 3  days prior to his emergency room presentation.  Prior to that, he was  homeless.  He has a history of multiple relapses on cocaine as well as  alcohol.  He is not married.  He has no children.  He used to live with  his mother, but he is not welcome back with his family due to his  substance abuse problem.   PAST MEDICAL HISTORY:  The usual childhood illnesses.  No known drug  allergies.   MEDICATIONS:  His MAR is reviewed.  He is on:  1. Ativan 1 mg q.8 hours p.r.n.  2. Ambien 10 mg q.h.s.  3. Lithium 300 mg b.i.d..   WBC 4.3, hemoglobin 14.7, platelet count 220.  Alcohol negative.  Lithium negative.  Tricyclics, none detected.  Urine drug screen  positive for cocaine.   REVIEW OF SYSTEMS:  CONSTITUTIONAL; HEAD, EYES, EARS, NOSE, THROAT,  MOUTH; NEUROLOGIC unremarkable.   PSYCHIATRIC:  He has received Vistaril in the past for anxiety with  success in treating acute anxiety symptoms.   Other psychiatric admissions include [facility] in the fall of  2000 as well as [facility] in November of 2000.   CARDIOVASCULAR; RESPIRATORY; GASTROINTESTINAL; GENITOURINARY; SKIN;  MUSCULOSKELETAL; HEMATOLOGIC/LYMPHATIC; ENDOCRINE/METABOLIC  unremarkable.   PHYSICAL EXAMINATION:  VITAL SIGNS:  Temperature 97.6, pulse 58,  respiratory rate 16, blood pressure 100/64.  GENERAL APPEARANCE:  [name] is a middle-aged male sitting up on his  hospital bed with no abnormal involuntary movements.   MENTAL STATUS EXAM:  [name] is alert.  His eye contact is  intermittent.  His attention span is mildly decreased.  His affect is  very constricted with tears.  His mood is depressed.  His concentration  is decreased.  He is oriented to all spheres.  His  memory is intact to  immediate, recent, and remote.  His fund of knowledge and intelligence  are normal.  His speech is soft with normal rate and prosody.  There is  no dysarthria.  Thought process is logical, coherent, goal-directed, no  looseness of associations.  Thought content:  He has thoughts of killing  himself, thoughts of hopelessness and helplessness.  His insight is  intact for depression.  His judgment is impaired.   ASSESSMENT:   AXIS I:  1. 293.83:  Mood disorder not otherwise specified (substance abuse      factors as well as an idiopathic history), depressed.  2. 296.80:  Bipolar disorder not otherwise specified.  [name] does      confirm a history of mania starting in his late 83s late teens or      early 53s.  He states that the manic episodes are rare now compared      to earlier in his life.  3. Polysubstance dependence.  4. Rule out 295.70, schizoaffective disorder.   AXIS II:  Deferred.   AXIS III:  See past medical history.   AXIS IV:  Economic, primary support group.   AXIS V:  Thirty.   [name] is at risk for suicide.  He also is at a high risk for  substance relapse.   The undersigned provided ego supportive psychotherapy and education.   The indications, alternatives, and adverse effects of lithium, Vistaril,  Ativan, and Ambien were discussed.  The patient understands and wants to  proceed as below.   RECOMMENDATIONS:  1. As seen in the past medical record, would proceed with his previous      lithium dosage at 600 mg p.o. b.i.d.  2. Five days from starting the lithium 600 mg b.i.d., would check a      lithium trough, blood level, and a basic metabolic panel.  3. Would check a baseline TSH.  4. Vistaril 25 to 75 mg p.o. t.i.d. p.r.n. anxiety or insomnia.  5. Would use the Ativan if the Vistaril is noneffective; using Ativan      at 1 mg p.o. t.i.d. p.r.n.  6. Would monitor for excessive thirst, tremor, stupor, or ataxia as      well as  nausea or diarrhea with the lithium regimen.  7. Would continue low stimulation ego support and would provide 12-      step materials reinforcing 12-step principles.  8. Would  admit to an inpatient psychiatric unit as soon as possible      for further evaluation and treatment.  9. Follow Terazol versus the [place] [facility] [place].      Antonietta Breach, M.D.  Electronically Signed     JW/MEDQ  D:  01/09/2009  T:  01/09/2009  Job:  [id number]
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
## 2010-10-08 NOTE — H&P (Signed)
[name], [name]               ACCOUNT NO.:  [id number]   MEDICAL RECORD NO.:  [id number]          PATIENT TYPE:  IPS   LOCATION:  0507                          FACILITY:  [facility]   PHYSICIAN:  Geoffery Lyons, M.D.      DATE OF BIRTH:  12/01/1960   DATE OF ADMISSION:  03/28/2007  DATE OF DISCHARGE:                       PSYCHIATRIC ADMISSION ASSESSMENT   DATE OF ASSESSMENT:  March 29, 2007 at 8:45 a.m.   IDENTIFYING INFORMATION:  A 50 year old [place] American male, single.  This is a voluntary admission.   HISTORY OF PRESENT ILLNESS:  This patient reports he relapsed on alcohol  and cocaine about 1-2 weeks ago his girlfriend to killed herself by  overdose.  He reports they have been living together for about 3 weeks  since his mother evicted him from the home.  He is currently drinking  about a 12-pack of alcohol most days for the past 2 weeks and smoking  crack cocaine pretty continuously every day for the past 7 days.  Prior  to that he reports 3 months of abstinence and had been attending  sessions at the [facility] here in [place]. He has a suicidal plan  to cut his wrists or jump in front of a car.  Denying any homicidal  thoughts.  The patient reports he is been off his medicines at least 2  weeks.   PAST PSYCHIATRIC HISTORY:  The patient is followed at the [facility]  by he believes Dr. Ezzard Flax and by Kenyon Ana his counselor.  Last  admission to [facility] July fifth to the  eleventh, 2008.  He has a history of bipolar disorder and polysubstance  abuse and was previously discharged on lithium carbonate 300 mg p.o.  t.i.d., Risperdal 0.5 mg at bedtime and Neurontin 100 mg four times a  day, Ambien 10 mg as needed for sleep.  He has a history of several  suicide attempts and polysubstance abuse.   SOCIAL HISTORY:  The patient was evicted from his mother's home several  weeks ago, has been living with his fiancee, cannot return to his  mother's.  Mother has taken all of this month's paycheck to pay bills  that the patient owed her money for.  He is denying any current legal  problems.  Endorsing homelessness and no financial resources.  The  patient is on disability and receives [employer].   MEDICAL HISTORY:  No regular primary care Quantel Mcinturff.  Medical problems:  He denies.   MEDICATIONS:  No medications in 2 weeks.   PREVIOUS MEDICATIONS:  Include lithium, Neurontin and Ambien.   DRUG ALLERGIES:  None.   POSITIVE PHYSICAL FINDINGS:  Tall, well-nourished, well-developed male  in no distress.  Temperature 98, pulse 90, respirations 18, blood pressure 113/62, 64  inches tall, 214 pounds, weight is a baseline.   REVIEW OF SYSTEMS:  Unremarkable.  He denies any withdrawal symptoms at  this point was placed on a Librium protocol on admission.   DIAGNOSTIC STUDIES:  CBC:  WBC 4.2, hemoglobin 13.2, hematocrit 39.9 and  platelets 189,000.  Chemistry remarkable for  BUN of 25, creatinine 1.21  and random glucose 130.  Liver enzymes SGOT 98, SGPT 40, alkaline  phosphatase 55 and total bilirubin 0.8, calcium 9.1 and TSH 0.961.   MENTAL STATUS EXAM:  Fully alert male, pleasant cooperative, blunted  affect.  Speech is minimal in production.  He does not have a lot to  offer.  Insight is poor, not sure why he relapsed, does admit to having  some grief over his girlfriend's overdose.  Mood is mildly depressed,  having suicidal thoughts with a plan to overdose or walk into traffic.  No evidence of homicidal thought.  Thinking is linear, no psychosis.  Does not appear to be internally distracted at this time.  Cognition is  intact to person, place and situation.  His primary concerns are where  he is going to live and asking for help with getting settled with a new  program and new living arrangements.   AXIS I:  Mood disorder, not otherwise specified, ethanol abuse and  dependence, polysubstance abuse.   AXIS II:   Deferred.   AXIS III:  No diagnosis.   AXIS IV:  Severe issues with homelessness and no funds, history of  problems with his primary support group being his mother and grief over  recent relationship loss.   AXIS V:  __________, but the past year 13.   PLAN:  To voluntarily admit the patient with q. 15-minutes checks in  place with a goal of a safe detox and alleviate his suicidal thought.  We started him on a Librium protocol.  We will check his hepatic enzymes  and TSH and urinalysis and urine drug screen is currently pending.  Estimated length of stay is 5 days.      Margaret A. Scott, N.P.      Geoffery Lyons, M.D.  Electronically Signed    MAS/MEDQ  D:  03/29/2007  T:  03/30/2007  Job:  [id number]

## 2010-10-08 NOTE — H&P (Signed)
[name], [name]               ACCOUNT NO.:  [id number]   MEDICAL RECORD NO.:  [id number]          PATIENT TYPE:  IPS   LOCATION:  0306                          FACILITY:  [facility]   PHYSICIAN:  Anselm Jungling, MD  DATE OF BIRTH:  Jan 02, 1961   DATE OF ADMISSION:  10/14/2008  DATE OF DISCHARGE:                       PSYCHIATRIC ADMISSION ASSESSMENT   HISTORY OF PRESENT ILLNESS:  The patient presents with a history of an  intentional overdose on Ambien and 4 Vistaril tablets after breakup with  his girlfriend, feeling very hopeless and helpless about living  situation.  He was just feeling very tired.  The patient has been  feeling suicidal ideation and having homicidal ideation towards no one  in particular.  He has no history of any violence.  He feels that he was  dumped off in a drug infested living facility.  He does state that  rather than resorting to using alcohol or drugs to cope, he overdosed.  He feels now that he is ready for long-term rehab or he may go live with  his mother.   PAST PSYCHIATRIC HISTORY:  The patient has had multiple admissions to  our facility.  He was last here within the last 2 weeks.   SOCIAL HISTORY:  This is a single male.  He was discharged to a living  facility, currently homeless.  Patient is on disability.   FAMILY HISTORY:  None.   ALCOHOL AND DRUG HISTORY:  He denies any recent relapse of alcohol or  drugs.   PRIMARY CARE Malay Fantroy:  Unknown.   MEDICAL PROBLEMS:  He denies any acute or chronic health issues.   MEDICATIONS:  The patient was discharged on lithium currently at 300  b.i.d. but was on 300 b.i.d. and 600 mg at 1800 hours with his last  admission, Ambien for sleep and Vistaril p.r.n. for anxiety.   DRUG ALLERGIES:  No known allergies.   PHYSICAL EXAMINATION:  GENERAL:  This is a well-nourished male fully  assessed in the emergency department where it states that patient was  agitated and disheveled.  He did receive some  Ativan during this stay.  VITAL SIGNS:  Temperature of 97.9, heart rate 91, respirations 16, blood  pressure 104/62, 100% saturated.   LABORATORY DATA:  Show a glucose of 102.  Urine drug screen is negative.   MENTAL STATUS EXAMINATION:  This is a fully alert, cooperative male who  is appropriately dressed.  He has good eye contact.  Speech is clear,  normal pace and tone.  The patient's mood is depressed and hopeless.  The patient's affect:  He does appear depressed.  Thought processes are  coherent.  He is endorsing suicidal thoughts but promises safety on our  unit.  Cognitive function is intact.  His memory appears intact.  Judgment and insight are fair.   AXIS I:  Mood disorder.  Polysubstance abuse in partial remission.  AXIS II:  Deferred.  AXIS III:  No known medical conditions.  AXIS IV:  Problems with housing.  Other psychosocial problems related to  support.  AXIS V:  Current is 35-40.  PLAN:  To stabilize mood thinking.  We will continue his medications.  We will check lithium level periodically.  Case manager will assess his  living situation and potential for a long-term rehab facility.  Tentative length of stay at this time is 3-5 days.      Landry Corporal, N.P.      Anselm Jungling, MD  Electronically Signed    JO/MEDQ  D:  10/16/2008  T:  10/16/2008  Job:  [id number]

## 2010-10-10 DIAGNOSIS — F39 Unspecified mood [affective] disorder: Secondary | ICD-10-CM

## 2010-10-11 NOTE — Discharge Summary (Signed)
NAME:  [name], [name] NO.:  [id number]   MEDICAL RECORD NO.:  [id number]                   PATIENT TYPE:  IPS   LOCATION:  0502                                 FACILITY:  [facility]   PHYSICIAN:  Geoffery Lyons, M.D.                   DATE OF BIRTH:  10/25/1960   DATE OF ADMISSION:  06/25/2002  DATE OF DISCHARGE:  06/30/2002                                 DISCHARGE SUMMARY   CHIEF COMPLAINT AND PRESENT ILLNESS:  This was one of multiple admissions to  [facility] for this 50 year old single African-American  male voluntarily admitted.  Presented to the emergency department  complaining of suicidal thoughts of walking into traffic.  Previous history  of prior suicide attempt by stepping in front of moving traffic.  He was  kicked out of his mother's house last week for relapsing on cocaine and  alcohol.  He found himself homeless, had not been taking his medication.  Previously on Geodon, Ambien and Prozac.  Claimed suicidal thoughts with  auditory hallucinations.   PAST PSYCHIATRIC HISTORY:  Multiple admissions to [facility].  Followed at [facility].  Multiple other  stays for cocaine and alcohol addiction and psychosis.   PAST MEDICAL HISTORY:  Uveitis.  Otherwise no major medical conditions.   MEDICATIONS:  He was not taking Prozac 40 mg per day, Geodon (dose unclear),  Ambien 10 mg at bedtime for sleep.   PHYSICAL EXAMINATION:  Performed and failed to show any acute findings.   MENTAL STATUS EXAM:  Sleepy, African-American male somewhat fearful,  blunted.  He is withdrawn, in bed, refused to answer any questions.  Cooperative only with mother's coaxing.  Tired-looking, refused to get out  of bed or come to consult.  Speech is clear without pressure.  Mood is  depressed, remorseful, feels guilty about the relapse, his loss of his  housing, not taking his medications.  Thought processes are  logical and  coherent.  Continued to have some auditory hallucinations.  Suicide without  a clear plan.  Cognition well-preserved.   ADMISSION DIAGNOSES:   AXIS I:  1. Schizoaffective disorder.  2. Polysubstance dependence.   AXIS II:  No diagnosis.   AXIS III:  No diagnosis.   AXIS IV:  Moderate.   AXIS V:  Global Assessment of Functioning upon admission 28; highest Global  Assessment of Functioning in the last year 60.   LABORATORY DATA:  Blood chemistries were within normal limits.  SGOT was 48,  SGPT 26, alkaline phosphatase 70, total bilirubin 0.8.   HOSPITAL COURSE:  He was admitted and started intensive individual and group  psychotherapy.  He was placed back on Geodon 40 mg, 2 daily, and then Prozac  40 mg every day.  He continued to complain of the hallucinations, so Geodon  was increased to 40 mg twice a  day and then 40 mg in the morning and 60 mg  at night and then finally 60 mg twice a day.  He was given some Vistaril for  sleep for anxiety and Symmetrel for cravings.  Continued to endorse the  suicidal ideation and the depression, being very overwhelmed.  As we  increased the medication, the symptoms improved.  It was very clear that he  was projecting, could not see his role in his situation, blaming others.  He  has not been able to or willing to follow up with the plan once he is  discharged.  Has continued to use substances.  On June 30, 2002, he was  in full contact with reality.  Mood was euthymic.  Affect was broad.  He  denied any suicidal ideation, any homicidal ideation.  No auditory  hallucinations.  No paranoia.  Was going to go to [facility] residential treatment  program for further work on his long-term abstinence.   DISCHARGE DIAGNOSES:   AXIS I:  1. Schizoaffective disorder.  2. Polysubstance dependence.   AXIS II:  No diagnosis.   AXIS III:  Elevated SGOT.   AXIS IV:  Moderate.   AXIS V:  Global Assessment of Functioning upon discharge  50.   DISCHARGE MEDICATIONS:  1. Prozac 40 mg per day.  2. Symmetrel 100 mg twice a day.  3. Geodon 60 mg twice a day.  4. Trazodone 100 mg, 1-2 at bedtime for sleep.   FOLLOW UP:  [facility] and then [facility].                                               Geoffery Lyons, M.D.    IL/MEDQ  D:  07/27/2002  T:  07/28/2002  Job:  [id number]

## 2010-10-11 NOTE — Discharge Summary (Signed)
[name], [name]               ACCOUNT NO.:  [id number]   MEDICAL RECORD NO.:  [id number]          PATIENT TYPE:  IPS   LOCATION:  0503                          FACILITY:  [facility]   PHYSICIAN:  Geoffery Lyons, M.D.      DATE OF BIRTH:  [birth year]   DATE OF ADMISSION:  09/25/2007  DATE OF DISCHARGE:  10/01/2007                               DISCHARGE SUMMARY   CHIEF COMPLAINT/PRESENT ILLNESS:  This is one of multiple admissions to  [facility] for this 50 year old male, voluntary  admitted.  He was recently discharged and he was only waiting to get  into the  [facility].  Claimed that the other residents did  not vote him in.  He then almost immediately relapsed on alcohol and  cocaine.  Was feeling depressed.  Wanted to go in to a long-term rehab  program.  Claimed he has been off his medications.   PAST MEDICAL HISTORY:  Multiple admissions to our facility, the last  April 2009, similar symptoms.   ALCOHOL AND DRUG HISTORY:  At the present time, substances mainly  alcohol and cocaine.   MEDICATIONS:  He was discharged on Lithium, Neurontin, but noncompliant.   PHYSICAL EXAMINATION:  Failed to show any acute findings.   LABORATORY WORKUP:  Alcohol level was 107.  UDS positive for cocaine.  CMET was within normal limits.  Lithium level less than 0.25.   MENTAL EXAM:  Reveals a male who is somewhat sleepy, but cooperative,  sense of humor about coming back.  Recanting the story about not getting  in to the [facility]. __________  are clear.  Alert and oriented.  No  delusional ideas.  Denied any active suicidal or homicidal ideation.   ADMITTING DIAGNOSES:  Axis I:  Bipolar disorder.  Alcohol, cocaine  dependence.  Axis II:  No diagnosis.  Axis III:  No diagnosis.  Axis IV:  Moderate.  Axis V:  Upon admission, 35.  Highest GAF in the last year 60.   HOSPITAL COURSE:  Was admitted.  Started in new group psychotherapy.  We  used Librium for detox.   He was placed back on his medications.  As  already stated, 50 year old male recently discharged, relapsed on  alcohol and cocaine after he could not get in to the [facility].  Endorsed that he was left wit no place to go.  Endorsed depression,  nausea, hopeless and helplessness.  Pain, guilt for his relapse.  By May  5 the was rationalizing, intellectualizing, projecting blame.  Hard time  accepting the fact that he did have options other than using.  Still  unclear where he was going to go.  Willing to get back on the Lithium.  Admitted that he needed long-term treatment.  We started checking on  placement.  He wanted to go to a long-term treatment program and knows  he needed structure.  We identified a program in [place].  We  pursued the detox.  Feeling the same.  May the 6 feeling the same, mood  of assigning blame, but able to accept  the challenge to reassess the way  he was perceiving the situation.  By May 8 he was in full contact with  reality.  There were no suicidal or homicidal ideations and no  hallucinations or delusions.  He was going to go in to program in  [place], [place].  He was very encouraged and motivated to  pursue it.   DISCHARGE ACTIVITIES:  Axis I:  Bipolar disorder.  Alcohol, cocaine  dependence.  Axis II:  No diagnosis.  Axis III:  No diagnosis.  Axis IV:  Moderate.  Axis V:  Upon discharge 50.   Discharged on Lithium 600 twice daily, Neurontin 100 mg 4x daily.   FOLLOWUP:  [facility] in [place].      Geoffery Lyons, M.D.  Electronically Signed     IL/MEDQ  D:  11/03/2007  T:  11/03/2007  Job:  [id number]

## 2010-10-11 NOTE — Discharge Summary (Signed)
[facility]  Patient:    [name], [name]                        MRN: [id number] Adm. Date:  [id number] Disc. Date: [id number] Attending:  Otilio Saber Dictator:   Eduard Roux, NP                           Discharge Summary  IDENTIFYING INFORMATION:  The patient is a 50 year old single African-American male voluntarily admitted secondary to exacerbation of bipolar disorder.  He is experiencing suicidal ideation.  Additionally, he is abusing both alcohol and cocaine.  The patient has reportedly been feeling depressed over the past month and began feeling suicidal with a plan to step out in front of the car. He additionally has been drinking three 40-ounce beers a day x 2 weeks, plus using as much crack cocaine as he can afford.  His last use of alcohol was May 17.  His last cocaine use was May 18.  He states he recently broke up with his girlfriend, who he states was "trying to make him normal."  This was his first girlfriend ever, he describes, and he was very traumatized by this, and subsequently relapsed on alcohol and drugs.  Prior to this, he had been sober for four months.  The patient, after that time, after he resumed drinking and drugging, was kicked out of his mothers house and then went to live in the homeless shelter where he was kicked out.  He states he is now on the streets and is feeling very suicidal.  He also states he is having a resurgence of auditory voices, where they are telling him that he is hearing the devils voice to hurt himself, and he "sees spirits."  The most recent inpatient hospitalization was [facility] in December.  He has been at [facility] in September of 2000, [facility] in November of 2000.  He is a client of [facility].  He has no history of taking lithium, Depakote, or Tegretol. He has had negative effects with Risperdal.  He has a history of  being noncompliant with his medication.  MEDICAL PROBLEMS:  He has a history of chest pain with smoking crack cocaine.  ALLERGIES:  TOMATOES.  ADMITTING MEDICATIONS:  Librium 25 mg t.i.d. on May 19, Librium 25 mg b.i.d. on May 20, Librium 25 mg q.d. on May 21, then discontinued.  Zyprexa 10 mg q.h.s., trazodone 50-100 mg q.h.s.  PHYSICAL EXAMINATION:  Was performed at [facility] without significant findings.  His urine drug screen was positive for cocaine and benzodiazepines.  His blood alcohol level was less than 10.  His i-STAT-8 was within normal limits.  MENTAL STATUS EXAMINATION:  Appearance and behavior:  Disheveled black male who was interviewed in his room.  He is cooperative.  His speech is very low. It is relevant.  He is able to stay on track.  His mood is depressed.  His affect is blunted.  His thought processes are coherent.  He is positive for suicidal ideation.  He contracts for safety.  He is verbalizing hopelessness and helplessness.  He is negative for homicidal ideation.  He states that he is hearing the devil talk to him, telling him to hurt himself.  He also states that he sees spirits.  His cognitive function appears to be intact.  He is alert  and oriented x 3.  He has impaired insight and judgment.  CURRENT DIAGNOSES: Axis I:    Substance abuse, mood disorder, alcohol and cocaine dependency,            history of bipolar and schizophrenic disorder. Axis II:   None. Axis III:  None. Axis IV:   Severe related problem:  Primary support group and housing            problems. Axis V:    Current GS is 30.  Highest past year of 60.  LABORATORY DATA:  Noted under physical exam.  HOSPITAL COURSE:  The patient was admitted to [facility] for stabilization of his bipolar disorder with coexisting depression, as well as, detox from alcohol and cocaine abuse.  He was admitted on a voluntary basis. Fifteen minute checks were provided for  safety.  The patient agreed to contract for safety.  We provided a modified Librium taper on the off chance that the patient could experience withdrawal.  We additionally resumed his Zyprexa 10 mg q.h.s. and trazodone 50-100 mg q.h.s.  The patient continued to exhibit some mood lability and voicing auditory hallucinations, and being isolative.  Depakote 500 mg q.12h. was started.  By May 22, he was seen and still experiencing, he stated, auditory hallucinations and his Zyprexa was increased at that time.  When he was seen on May 22, he stated that he had woken up a couple of times in the middle of the night crying.  He was continuing to have hallucinations.  He still did not have the will to live.  He also was verbalizing extensive social and financial problems.  Zyprexa, as noted, was increased at that time.  The patient was seen by May 24 and his sleep and appetite had improved.  He was more alert and interactive in the milieu.  He was reporting that he continues to hallucinate.  He talks about demons, who drink alcohol at the [facility].  There was no existence of pressured speech or agitation. There was no evidence of that the patient was attending to internal stimuli. The patient, however, was beginning to verbalize readiness for discharge.  He was denying any suicidal ideation and there were no signs and symptoms of withdrawal noted.  It was felt, in conjunction with the patient, that he could be discharged at this time to the [facility] with follow-up at [facility].  CONDITION AT DISCHARGE:  The patient is discharged in improved condition with his mood, sleep, and appetite.  He was denying suicidal and homicidal ideation, and was reporting a marked decrease in his auditory and visual hallucinations.  He was verbalizing readiness for discharge.  DISPOSITION:  The patient was discharged home and follow-up.  FOLLOW-UP:  [facility] in the  [facility].  DISCHARGE MEDICATIONS: 1. Zyprexa 15 q.h.s. 2. Trazodone 50-100 mg q.h.s. 3. Depakote 500 mg b.i.d.  FINAL DIAGNOSES:  Axis I:    Substance abuse, mood disorder, alcohol and cocaine dependency,            history of bipolar disorder. Axis II:   None. Axis III:  None. Axis IV:   Severe related problems, primary support group and housing            problems. Axis V:    Current GS is 50, highest past year 60. DD:  11/06/99 TD:  11/11/99 Job: [id number]

## 2010-10-11 NOTE — Discharge Summary (Signed)
[facility]  Patient:    [name], [name]                      MRN: [id number] Adm. Date:  [id number] Disc. Date: [id number] Attending:  Ilene Qua Dictator:   Johnella Moloney, N.P.                           Discharge Summary  HISTORY OF PRESENT ILLNESS:  [name] is a 50 year old single African-American male voluntarily committed secondary to polysubstance abuse and suicidal ideation.  Patient has a history of schizophrenia and bipolar disorder and polysubstance abuse.  He was just recently discharged from [facility] approximately two weeks ago, where he was discharged to the [facility].  He presented to [facility] on October 27, 1999 with suicidal ideation and reportedly had jumped in front of a car and then had taken a bus to the emergency room.  [name] also reports using crack cocaine and drinking two 40 ounce beers per day times the past three days.  He is using approximately $40 worth of crack cocaine a day for the past three days. Patient denies homicidal ideation, positive for suicidal ideation.  He does contract for safety on the unit.  He is positive for auditory and visual hallucinations, voices telling him to kill himself, and visual hallucinations, he has seen "demons."  He is reporting decreased appetite and decreased sleep.  The patient has had numerous inpatient psychiatric treatments for detoxification and bipolar schizophrenic exacerbations.  He was most recently discharged from [facility] one week ago.  He was hospitalized here in December 2000, [facility], September 2000, [facility], November 2000.  He is a client of [facility] with a long history of noncompliance with his treatment.  ADMISSION MEDICATIONS: 1. Zyprexa 10 mg q.h.s. 2. Prozac 20 mg q.d. 3. Trazodone 150 mg q.h.s. p.r.n.  Patient reports losing these medications, but he did not know how long it  has been since he has taken them.  ALLERGIES:  No known allergies reported.  PHYSICAL EXAMINATION:  Physical exam was performed at [facility].  He presented intoxicated.  There were no signs or symptoms of trauma secondary to jumping in front of a car.  LABORATORY DATA:  Urine drug screen was positive for cocaine.  His urinalysis revealed a small amount of hemoglobin.  His I-stat was within normal limits. His blood alcohol level was 188.  An EKG was performed upon arrival to the unit within normal sinus rhythm.  MENTAL STATUS EXAMINATION ON ADMISSION:  This African-American male was interviewed in his room.  Speech normal rate and tone and relevant.  Mood and affect were depressed.  Thought processes were logical and coherent.  He states that he is having auditory and visual hallucinations.  The voices are telling him to hurt himself.  He has visions and sees demons.  He has suicidal ideation as noted in the HPI.  Patient states that he jumped in front of a car at this time.  He does contract for safety on the unit.  Cognitive functioning appears to be intact.  He is alert and oriented x 3.  Impaired insight and judgment.  ADMITTING DIAGNOSES: Axis I:    1. Substance induced mood disorder.            2. History of bipolar disorder.  3. History of schizophrenia.            4. Alcohol and cocaine abuse. Axis II:   None. Axis III:  Chest pain with smoking crack cocaine.  EKG was within normal            limits. Axis IV:   Severe, related to problems with primary support group, housing            problems, emotional problems, and economic problems. Axis V:    Current global assessment of functioning 30, highest in past year            72.  LABORATORY DATA:  Reported under physical exam.  HOSPITAL COURSE:  The patient was admitted to the [facility] for treatment of his depression and psychotic symptoms.  He was started on Prozac 20 q.d. and we  did discontinue the ibuprofen and changed him to Vioxx 12.5 mg b.i.d.  We also gave him Vistaril 25 mg q.4h. p.r.n. for anxiety.  He was also started on Geodon 20 mg b.i.d., Trazodone 1500 mg h.s. p.r.n., Librium 25 mg p.o. one time only, and ibuprofen 400 t.i.d. p.r.n.  The Geodon was increased on June 8 to 40 mg b.i.d. and Neurontin was 100 mg q.i.d.  Then we changed the Neurontin on June 9 to Neurontin 200 mg t.i.d., and we had to decrease it to Neurontin 100 t.i.d. and increased the Prozac to 40 q.d.  While patient was here at the hospital, he complained of feeling very depressed, suicidal, says he feels at the end of his ropes.  He slept fair, appetite poor, energy poor. He was still hearing voices and Geodon was increased on June 6.  He stated that he felt oversedated with increased Geodon.  He is still having auditory hallucinations telling him "there are demons after him.  He will never get well, go ahead and end it."  Patient states he sees faces of demons.  He thought about jumping through the window or banging his head on the mirror. He contracted for safety.  We can decide to continue the Geodon at present. On June 7 he reported increased anxiety relieved with Vistaril.  Patient absolutely reports worry over upcoming court case where he needs to pay a fee, unable to sleep last night, attempting to reconcile with mother.  Patient attending ADLs better, attending groups.  Mood was still depressed with suicidal ideation.  He was able to contract.  Positive for auditory and visual hallucinations, increased anxiety, and fearful that he could not reconcile with his mother.  On June 7 he continued the Geodon.  On June 12, patient was seen.  He was still contracting for safety and verbalizing readiness for discharge.  We spoke with the patients mother on June 11 and she confirmed that patient can return home if he completes his household duties.  Patient agreed to all stipulations.  He  reported decrease in auditory hallucinations. At that time we decided to send him home since he felt like he would be safe and treated on an outpatient basis.   CONDITION ON DISCHARGE:  Patient discharged in improved condition with improvement in his mood, sleep, appetite, no suicidal ideation, no psychotic symptoms, no hallucinations.  FOLLOW-UP:  Patient is to follow up with the [facility] to see [facility] June 21.  DISCHARGE MEDICATIONS: 1. Trazodone 100 mg 1/2 to one tablet at bedtime p.r.n. 2. Geodon 40 mg take one tab b.i.d. 3. Neurontin 100  mg one tab t.i.d. 4. Vistaril 25 mg one tab q.4h. p.r.n. for anxiety. 5. Prozac 40 mg one tab per day.  FINAL DIAGNOSES: Axis I:    1. Schizophrenia, paranoid type.            2. Substance induced mood disorder.            3. History of bipolar disorder.            4. Alcohol and cocaine abuse. Axis II:   None. Axis III:  None. Axis IV:   Moderate, related to problems with primary support group. Axis V:    Current global assessment of functioning is 50, highest in past            year 55. DD:  12/31/99 TD:  01/02/00 Job: [id number]

## 2010-10-11 NOTE — H&P (Signed)
NAME:  [name], PATELLA NO.:  [id number]   MEDICAL RECORD NO.:  [id number]                   PATIENT TYPE:  IPS   LOCATION:  0502                                 FACILITY:  [facility]   PHYSICIAN:  Geoffery Lyons, M.D.                   DATE OF BIRTH:  04-Feb-1961   DATE OF ADMISSION:  06/25/2002  DATE OF DISCHARGE:                         PSYCHIATRIC ADMISSION ASSESSMENT   DATE OF ASSESSMENT:  June 26, 2002   PATIENT IDENTIFICATION:  This is a 50 year old single African-American male  who is voluntarily admitted.   HISTORY OF PRESENT ILLNESS:  This patient presented in the emergency  department complaining of suicidal thoughts of walking into traffic.  He has  a previous history of prior suicide attempt by stepping in front of moving  traffic.  He was kicked out of his mother's house last week for relapsing on  cocaine and alcohol.  He is now apparently homeless and states he has been  walking the streets for the past three days.  He has not taken any  medications for the past week or so and was previously on Geodon, Ambien,  and Prozac.  The patient reports he has had suicidal thoughts with auditory  hallucinations.  He wakes up seeing his grandmother's face and hearing her  voice talking to him.  He endorses cocaine addiction, which he finds  difficult to stop.  He is resistant to interview today and answers very few  questions.  History is taken from the little that he does answer and  primarily from the record.   PAST PSYCHIATRIC HISTORY:  The patient has been followed by [facility].  This is one of multiple admissions to [facility] with the last one being in December 2003.  He has  had multiple hospitalizations for cocaine and alcohol addiction and  psychosis.  He has a history of prior suicide attempt by attempting to walk  into traffic.  The patient has a long history of schizoaffective  disorder,  depressed type.   SUBSTANCE ABUSE HISTORY:  See above.   PAST MEDICAL HISTORY:  The patient is followed at [facility].  His currency with care is unclear.  Medical problems: None.  He  denies any today.  Past medical history is remarkable for a recent of  uveitis, which was treated with Cyclogyl and eye drops approximately last  month, which he states cleared up completely and required no more treatment.   MEDICATIONS:  Medications have been in the past:  1. Prozac 40 mg daily.  2. Geodon dose unclear.  3. Ambien 10 mg at h.s.   The patient reports he has not had any medications for more than a week.   DRUG ALLERGIES:  None.   REVIEW OF SYSTEMS:  Review of systems is remarkable for some generalized  body aches, which  are vague today, which he says are responding to regular  Tylenol.  He attributes this just from walking the streets for the past  three days.   PHYSICAL EXAMINATION:  GENERAL:  The patient's physical examination was done  in the emergency room.  It was noted in the record.  It was generally  unremarkable.  He was 6 feet 1 inch tall and weighed 196 pounds.  VITAL SIGNS:  The patient was afebrile at the time of admission, pulse 95,  respirations within normal limits, blood pressure 105/68.   LABORATORY DATA:  The patient's laboratory studies reveal urine drug screen  positive for cocaine.  CBC was within normal limits.  Hemoglobin 14.4,  hematocrit 43.1, MCV 88.8, platelets 208,000.  His electrolytes were  essentially normal.  Liver enzymes reveal a very mildly elevated SGOT at 48,  SGPT 26, normal bilirubin.  BUN 20, creatinine 1.0.  Alcohol level was less  than 5.  Urinalysis was essentially normal.   SOCIAL HISTORY:  The patient is a single African-American male who is  currently homeless, never married, no children.  Legal charges are unclear  at this point.  He is apparently on disability because of his mental  illness.   FAMILY  HISTORY:  Family history is not clear.   MENTAL STATUS EXAM:  This is a sleepy African-American male with somewhat  fearful affect, blunted.  He is withdrawn.  He is in the bed, refuses to  answer many questions.  He is cooperative only with much coaxing.  He is  tired looking, refuses to get out of bed or come to consult room for  interview or to submit to any additional physical.  Speech is clear without  pressure.  Mood is depressed.  He is somewhat remorseful.  He does seem to  feel quite guilty about his relapse, his loss of his housing, and not taking  his medication.  Thought process is logical and coherent.  He is continuing  to have some auditory hallucinations today, is suicidal without any clear  plan at this point.  He is able to promise safety on the unit.  No homicidal  ideation.  Cognitive: Intact and oriented x 3.  Intelligence is average to  above average.  Insight: Poor.  Judgment and impulse control: Questionable.   ADMISSION DIAGNOSES:   AXIS I:  1. Schizoaffective disorder, depressed type.  2. Polysubstance abuse, rule out dependence.  3. Cocaine and ethyl alcohol abuse and dependence.   AXIS II:  No diagnosis.   AXIS III:  Elevated serum glutamic-oxaloacetic transaminase.   AXIS IV:  Severe stress from mental illness and lack of housing.   AXIS V:  Current 28, past year 40.   INITIAL PLAN OF CARE:  Plan is to voluntarily admit the patient with q.9m.  checks in place with a goal of a safe detoxification from alcohol, to treat  his cocaine addiction, and to alleviate his suicidal ideation.  We have  elected to start him on  Librium detoxification protocol.  We will restart  Prozac 40 mg p.o. daily, Geodon 40 mg daily, trazodone 50 mg at h.s.  We are  going to additionally add Symmetrel 100 mg b.i.d. for his recurrent cocaine  addiction.  We will not, at this time, treat his SGOT elevation since we assume that is probably secondary to his alcohol abuse.  We  will keep a  close eye on it.   ESTIMATED LENGTH OF STAY:  Five days.  Margaret A. Scott, N.P.                   Geoffery Lyons, M.D.    MAS/MEDQ  D:  06/27/2002  T:  06/27/2002  Job:  [id number]

## 2010-10-11 NOTE — H&P (Signed)
NAME:  [name], [name]                         ACCOUNT NO.:  [id number]   MEDICAL RECORD NO.:  [id number]                   PATIENT TYPE:  IPS   LOCATION:  0301                                 FACILITY:  [facility]   PHYSICIAN:  Geoffery Lyons, M.D.                   DATE OF BIRTH:  1961/01/11   DATE OF ADMISSION:  02/24/2002  DATE OF DISCHARGE:                         PSYCHIATRIC ADMISSION ASSESSMENT   IDENTIFYING INFORMATION:  A 50 year old single African-American male  voluntarily admitted on February 24, 2002.   HISTORY OF PRESENT ILLNESS:  The patient presents with a history of  depression and questionable intentional overdose of 3 Prozac, drinking 3  packs of beer and starting cocaine, stating he was depressed due to his  stepfather's and grandmother's deaths.  The patient has had multiple  admissions for depression and grief related to his grandmother's death.  He  states that at this time it is another grandmother that passed away.  He  reports positive auditory hallucinations.  He hears his grandmother telling  him to join her.  He was recently discharged from [facility]  approximately 1 month ago for depression and psychosis, alcohol and cocaine  abuse.  He states he relapsed on alcohol.  The patient denies any visual  hallucinations, no paranoia and reports he may be homeless.   PAST PSYCHIATRIC HISTORY:  Was at [facility] 1 month ago for  depression.  Longest history of sobriety from alcohol has been 6 months.  That was approximately a year ago.  He has had multiple hospitalizations for  alcohol and cocaine abuse.  He has a history of bipolar disorder.   SOCIAL HISTORY:  He is a 50 year old single African-American male with no  children.  He is homeless.  He is on disability, with no legal charges.   FAMILY HISTORY:  Unclear.   ALCOHOL DRUG HISTORY:  The patient smokes, with a long history of alcohol  and cocaine abuse.   PAST MEDICAL HISTORY:  Primary  care [name] is none.  Medical problems are  none that he is aware of.   MEDICATIONS:  States he has been on Prozac 20 mg and Zyprexa 5 mg after his  discharge from [facility].  He has been noncompliant with his  medications.   DRUG ALLERGIES:  No known allergies.   PHYSICAL EXAMINATION:  Performed at [facility].  Alcohol level was less than  5.  The patient has a borderline EKG.  Hemoglobin 17, hematocrit 50.  His  BMET is within normal limits.  CPK is 1227.  Acetaminophen level is less  than 10, salicylate level less than 4.  Urine drug screen was positive for  cocaine.   MENTAL STATUS EXAM:  He is in bed, no eye contact, passively cooperative.  Speech is soft spoken, polite.  Mood is depressed, affect is flat.  Thought  processes are endorsing positive auditory  hallucinations, no visual  hallucinations, no paranoia, no current suicidal or homicidal ideation.  Cognitive function intact.  Memory is fair.  Judgment and insight are fair.  Poor impulse control.  Reliability is uncertain.   ADMISSION DIAGNOSES:    AXIS I:  1. Substance-induced mood disorder.  2. Rule out malingering.   AXIS II:  Deferred.   AXIS III:  None.   AXIS IV:  Problems related to social environment.   AXIS V:  Current is 35, this past year 46.   PLAN:  Voluntary admission for depression and psychosis, alcohol and cocaine  abuse.  Contract for safety, check every 15 minutes.  Will encourage group  activity to increase his coping skills, encourage food and fluid intake,  resume his medications.  Medical compliance was discussed.  The patient to  follow up with mental health and remain alcohol and drug free.   TENTATIVE LENGTH OF CARE:  4-6 days.      [name], N.P.                       Geoffery Lyons, M.D.    JO/MEDQ  D:  02/25/2002  T:  02/26/2002  Job:  [id number]
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
## 2010-10-11 NOTE — Discharge Summary (Signed)
NAME:  [name], [name]                         ACCOUNT NO.:  [id number]   MEDICAL RECORD NO.:  [id number]                   PATIENT TYPE:  IPS   LOCATION:  0502                                 FACILITY:  [facility]   PHYSICIAN:  Jeanice Lim, M.D.              DATE OF BIRTH:  [birth year]   DATE OF ADMISSION:  06/09/2003  DATE OF DISCHARGE:  06/20/2003                                 DISCHARGE SUMMARY   IDENTIFYING. DATA:  This is a 50 year old African-American male, single,  voluntarily admitted with history of bipolar disorder and polysubstance  abuse, presenting agitated, having had a panic attack, drinking daily.  Alcohol level is 175. Given 20 mg of Geodon for agitation in the emergency  room. Reports he remains clean from cocaine and just slipped on alcohol the  day prior to admission. This may be the longest period of sobriety from  cocaine, apparently being compliant with followup and doing much better at  the time, very much brighter affect, almost manic at time of admission.   MEDICATIONS:  Previously on Prozac, Vistaril, and Risperdal but had only  taken the Prozac.   ALLERGIES:  No known drug allergies.   PHYSICAL EXAMINATION:  Within normal limits. Neurologically nonfocal.   ROUTINE ADMISSION LABORATORY DATA:  Essentially within normal limits.   The patient requested only being on Prozac and Vistaril.   MENTAL STATUS EXAM:  Fully alert in no acute distress. Pleasant, bright  affect, quick smile. Thought process was goal directed. Speech was somewhat  quick, increased rate, and some grandiosity, expansive affect, and some  hyperreligiousness as well. Reports that at times he feels like there is God  and other times he feels like blowing his brains out, describing clear mood  instability without mixed symptoms on the high side. Cognitively intact.  Judgment and insight fair; however, patient is not aware of his mood  instability and the need to take different medications  as well as the  significance of alcohol; however, overall has been not doing quite well as  far as keeping followup appointments and abstaining from cocaine which is  typically his drug of choice.   ADMISSION DIAGNOSES:   AXIS I:  1. Bipolar disorder, mixed versus hypomanic state.  2. History of alcohol dependence and cocaine dependence, cocaine dependence     in partial remission.   AXIS II:  None.   AXIS III:  None.   AXIS IV:  Moderate, limited support system, conflict with family.   AXIS V:  35/60.   The patient was admitted and ordered routine p.r.n. medications and  underwent further monitoring. Was encouraged to participate in individual,  group, and milieu therapy. The patient presented with clear hypomanic  symptoms, racing thoughts, grandiosity, and hyperreligious and mood  instability with suicidal thoughts. Very upset at his family and feeling  that there are counter therapeutic and that he needs to move to [facility].;  however, they receive money from his disability check, and he is not sure if  he is going to be ___________ just by him not having used cocaine. The  patient gradually gained more insight, and mood became more stable. He was  stabilized on Depakote and Risperdal. The patient refused Risperdal at the  higher doses due to feeling sedated and did continue and was compliant with  Depakote. At the time of discharge, mood was much more stable. Thought  processes goal directed. He was more reality based. Judgment and insight had  improved. There were no psychotic symptoms. No dangerous ideation. The  patient remained motivation to remain abstinent, highly motivated. The  patient was discharged on Vistaril 50 mg b.i.d. and 100 mg q.h.s. and  Depakote ER 500 mg two q.h.s. No Prozac secondary to mood induction. The  patient was to follow up at [facility] center Wednesday  June 26 at 2 p.m. and call [facility] to schedule followup.   DISCHARGE  DIAGNOSES:   AXIS I:  1. Bipolar disorder, mixed versus hypomanic state.  2. History of alcohol dependence and cocaine dependence, cocaine dependence     in partial remission.   AXIS II:  None.   AXIS III:  None.   AXIS IV:  Moderate, limited support system, conflict with family.   AXIS V:  __________                                               Jeanice Lim, M.D.    JEM/MEDQ  D:  07/22/2003  T:  07/24/2003  Job:  [id number]

## 2010-10-11 NOTE — Discharge Summary (Signed)
[facility]  Patient:    [name], [name]                      MRN: [id number] Adm. Date:  [id number] Disc. Date: [id number] Attending:  Denny Peon                           Discharge Summary  NO DICTATION DD:  03/16/99 TD:  03/16/00 Job: [phone] [id number]
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
## 2010-10-11 NOTE — H&P (Signed)
[facility]  Patient:    [name], [name]                        MRN: [id number] Adm. Date:  [id number] Attending:  Otilio Saber Dictator:   Eduard Roux, NP                         History and Physical  IDENTIFYING INFORMATION:  The patient is a 50 year old single African-American male, voluntarily admitted secondary to polysubstance use and suicidal ideation.  HISTORY OF PRESENT ILLNESS:  The patient has a history of schizophrenia and bipolar disorder and polysubstance abuse.  He was just recently discharged from [facility] approximately two weeks ago, where he was discharged to the [facility].  He presented to [facility] on October 27, 1999, with suicidal ideation and purportedly had jumped in front of a car and then had taken a bus to the emergency room.  Yoni also reports using crack cocaine and drinking two 40 ounce beers per day times the past three days, using approximately 40 dollars worth of crack cocaine a day times the past three days.  The patient denied homicidal ideation.  He is positive for suicidal ideation.  He does contract for safety on the unit.  He is positive for auditory and visual hallucinations.  The voices are telling him to kill himself and the visual hallucinations, he has "seen demons."  He is reporting decreased appetite and decreased sleep.  PAST PSYCHIATRIC HISTORY:  Bhavya has numerous inpatient treatments for detoxification and bipolar/schizophrenia exacerbations.  He was most recently discharged from [facility] approximately one week ago.  He was also hospitalized here on December 2000, [facility] on September 2000, [facility] on November 2000.  He is a client of [facility] with a long history of noncompliance with treatment.  SOCIAL HISTORY:  He has never been married.  He has no children.  He is currently  homeless.  Prior to this, he had been living with his mother, until he relapsed on drugs.  FAMILY HISTORY:  His father is an alcoholic.  ALCOHOL AND DRUG HISTORY:  Hilmer has a long history of alcohol and drug use, stemming back to his late teens.  He has no history of DTs or seizures with alcohol cessation.  He does have a history of chest pain when using crack cocaine.  PAST MEDICAL HISTORY:  Primary care Ayasha Ellingsen, none.  MEDICAL PROBLEMS:  History of chest pain when smoking crack cocaine.  MEDICATIONS: 1. Zyprexa 10 mg q.h.s. 2. Prozac 20 mg q.d. 3. Trazodone 5100 mg q.h.s. p.r.n.  The patient reports losing these medications.  He could not articulate the last time he had taken them.  DRUG ALLERGIES:  No known allergies.  PHYSICAL EXAMINATION:  GENERAL:  The physical examination was performed at [facility].  He presented intoxicated.  There were no signs or symptoms of overt trauma, secondary to jumping in front of a car.  LABORATORY DATA:  The urine drug screen was positive for cocaine.  His urinalysis revealed a small amount of hemoglobin.  His I-stat was within normal limits.  His blood alcohol level was 188.  An EKG was performed upon arrival to the unit with normal sinus rhythm.  MENTAL STATUS EXAMINATION:  Appearance and behavior:  An African-American male who is interviewed in his room.  His  speech is normal rate tone is relevant. His mood and affect are depressed.  His thought processes are logical and coherent.  He states that he is positive for auditory and visual hallucinations.  The voices are telling him to hurt himself.  He has visions. He sees "demons."  He positive suicidal ideation, as noted under HPI.  The patient states that he jumped in front of a car this time.  He does contract for safety on the unit.  Cognitive function appears to be intact.  He is alert and oriented x 3.  He has impaired insight and judgment.  ADMISSION  DIAGNOSES: Axis I:    Substance induced mood disorder, history of bipolar disorder,            schizophrenia, alcohol and cocaine abuse. Axis II:   None. Axis III:  Chest pain with smoking crack cocaine. Axis IV:   Severe, related to problems with primary support group, housing            problems, occupational problems, economic problems. Axis V:    Current GAF is 30, the highest in the past year was 55.  TREATMENT PLAN/RECOMMENDATIONS:  We will voluntarily admit Son to [facility] for stabilization, 15-minute checks for safety.  An EKG was performed and revealed to be normal sinus rhythm without prolongation of QT interval.  We will start Geodon 20 mg b.i.d., resume Prozac 20 mg q.d., trazodone 5100 mg q.h.s. p.r.n.  We will give a onetime dose of Librium 25 mg p.o.  PENDING LABORATORY VALUES:  At the time of dictation, CMET, TSH, T4, T3, CBC.  TENTATIVE LENGTH OF STAY:  Two to three days.  FOLLOW-UP:  [facility]. DD:  10/28/99 TD:  10/29/99 Job: [id number]

## 2010-10-11 NOTE — Discharge Summary (Signed)
NAME:  [name], [name]                         ACCOUNT NO.:  [id number]   MEDICAL RECORD NO.:  [id number]                   PATIENT TYPE:  IPS   LOCATION:  0301                                 FACILITY:  [facility]   PHYSICIAN:  Geoffery Lyons, M.D.                   DATE OF BIRTH:  August 27, 1960   DATE OF ADMISSION:  02/24/2002  DATE OF DISCHARGE:  03/07/2002                                 DISCHARGE SUMMARY   CHIEF COMPLAINT AND PRESENT ILLNESS:  This was the first admission to [facility] for this 50 year old single African-American  male voluntarily admitted.  He had a history of depression, questionable  intentional overdose of three Prozac, drinking 3 packs of beer and snorting  cocaine.  He was stating he was depressed due to his stepfather's and  grandmother's deaths.  He had multiple admissions for depression and grief  related to his grandmother's death.  At this time, another grandmother  passed away.  He had positive auditory hallucinations of his grandmother  telling him to join her.  He was recently discharged from [facility] one month ago for depression and psychosis and alcohol and cocaine  abuse.  He relapsed on alcohol.  He had no visual hallucinations, no  paranoia.  He was homeless.   PAST PSYCHIATRIC HISTORY:  He was at [facility] one month ago for  depression.   SUBSTANCE ABUSE HISTORY:  He had a long history of alcohol and cocaine abuse  and dependence.   PAST MEDICAL HISTORY:  Medical history is noncontributory.   MEDICATIONS:  1. Prozac 20 mg per daily.  2. Zyprexa 5 mg at night.   PHYSICAL EXAMINATION:  Physical examination was performed, failed to show  any acute findings.   MENTAL STATUS EXAM:  Mental status exam revealed a well nourished, well  developed, alert, cooperative male in bed, no eye contact, passively  cooperative.  Speech was soft spoken and polite.  Mood was depressed.  Affect was flat.  Thought  processes: Endorsing positive auditory  hallucinations, no visual hallucinations, no paranoia, no current suicidal  or homicidal ideas.  Cognitive: Cognition was well preserved.   ADMISSION DIAGNOSES:   AXIS I:  1. Major depression with psychotic features.  2. Substance-induced mood disorder.   AXIS II:  No diagnosis.   AXIS III:  No diagnosis.   AXIS IV:  Moderate.   AXIS V:  Global assessment of functioning upon admission 35, highest global  assessment of functioning in the last year 60.   LABORATORY DATA:  Other laboratory workup: CBC was within normal limits.  Blood chemistries were within normal limits.  Thyroid profile was within  normal limits.   HOSPITAL COURSE:  He was admitted and started in intensive individual and  group psychotherapy.  He was started back on his medications.  He had gone  off medications, continued  to report difficulty with sleep.  He was waking  up seeing his grandmother.  He was given Zyprexa, Vistaril.  He was started  on Prozac 30 mg per day, Zyprexa 5 mg at night.  Zyprexa was gradually  increased to 7.5 mg to 10 mg and Prozac was increased to 30 mg and then 40  mg.  As we continued to increase the dosages, his mood improved, his affect  became brighter, he denied any further auditory hallucinations.  We were  trying to get a bed in a rehab place.  By October 13, he was in full contact  with reality, much improved, was wanting to attend outpatient treatment so  we went ahead and discharged him to outpatient followup.  He had worked on  increased insight, grief work, as well as relapse prevention.   DISCHARGE DIAGNOSES:   AXIS I:  1. Major depression with psychotic features.  2. Substance-induced mood disorder.  3. Cocaine and alcohol dependence.   AXIS II:  No diagnosis.   AXIS III:  No diagnosis.   AXIS IV:  Moderate.   AXIS V:  Global assessment of functioning upon discharge 50.   DISCHARGE MEDICATIONS:  1. Zyprexa 10 mg at  bedtime.  2. Prozac 40 mg daily.   FOLLOW UP:  He was to follow up [facility].                                                 Geoffery Lyons, M.D.    IL/MEDQ  D:  04/06/2002  T:  04/07/2002  Job:  [id number]

## 2010-10-11 NOTE — H&P (Signed)
[facility]  Patient:    [name], [name]                        MRN: [id number] Adm. Date:  [id number] Attending:  Marlyn Corporal Fabmy Dictator:   Eduard Roux, N.P.                         History and Physical  IDENTIFYING INFORMATION:  The patient is a 50 year old single African-American male, voluntarily admitted secondary to exacerbation of bipolar disorder.  He is experiencing suicidal ideation.  Additionally, he is abusing both alcohol and cocaine.  HISTORY OF PRESENT ILLNESS:  The patient has reportedly been feeling depressed over the past month, and began feeling suicidal, with a plan to step out in front of a car.  He, additionally, has been drinking three 40 ounce beers a day times two weeks, plus using as much crack cocaine as he can afford.  His last alcohol use was reportedly Oct 10, 1999.  His last cocaine use was Oct 11, 1999.  He states that he recently broke up with his girlfriend, who, he states, was trying to "make him be normal," this was his first girlfriend ever, and he was very traumatized by this, and then relapsed on alcohol and drugs.  Prior to this, he was sober for four months.  The patient, after that time, after he resumed drinking and drugging, was kicked out of his mothers house for this, and then he went to the homeless shelter, where he was kicked out there.  He states that he is now on the streets and is feeling very suicidal.  He also states that he is having a resurgence of auditory voices, where they are telling him that he is hearing the devils voice to hurt himself, and he sees "spirits."  PAST PSYCHIATRIC HISTORY:  The most recent inpatient hospitalization was at [facility] in December.  He has been at [facility] in September 2000, [facility] in November 2000.  He is a client of [facility].  He has no history of taking lithium, Depakote, or Tegretol.  He  has had a negative effect with Risperdal.  He has a history of being noncompliant with his medications.  SOCIAL HISTORY:  He had recently been living with his mother, until she kicked him out after resuming abusing alcohol and cocaine.  He has never been married.  He has no children.  He recently has had his first girlfriend, and she has broken up with him.  FAMILY HISTORY:  His father was an alcohol.  His grandfather is an alcoholic.  ALCOHOL/DRUG HISTORY:  He has a long history of self-medicating and abusing both alcohol an cocaine.  He has no history of alcohol-related seizures or DTs with alcohol cessation.  However, he has had chest pain with smoking crack. He resumed drinking two weeks ago and is drinking three 40 ounce beers per day, plus, smoking crack cocaine.  MEDICAL PROBLEMS:  History of chest pain with smoking crack.  MEDICATIONS:  The patient was discharged on December 2000, from [facility] on medications of Prozac 20 mg q.d., Gabitril 4 mg q.d., Zyprexa 10 mg q.h.s.  He states that he has been noncompliant with all of these medications.  ALLERGIES:  TOMATOES.  PHYSICAL EXAMINATION:  Performed at [facility], without significant findings.  His urine drug screen was positive for  cocaine and benzodiazepines.  His blood alcohol level was less then 10.  His I-stat was within normal limits.  MENTAL STATUS EXAMINATION:  Appearance and behavior, a disheveled black male interviewed in his room.  He is cooperative.  His speech is very low.  It is relevant.  He is able to stay on track.  His mood is depressed.  His affect is blunted.  His though processes are coherent.  He is positive for suicidal ideation.  He contracts for safety.  He is verbalizing hopelessness and helplessness.  Negative for HI.  He states that he is hearing the devil talk to him, telling him to hurt himself.  He also states that he sees spirits. His cognitive function  appears to be intact.  He is oriented x 3.  He has impaired insight and judgment.  DIAGNOSES: Axis I:    Substance-induced mood disorder, alcohol and cocaine abuse,            history of bipolar and schizophrenic disorder. Axis II:   None. Axis III:  None. Axis IV:   Severe, related to problems with primary support group and            housing problems. Axis V:    Current GAF is 30, highest in the past year of 60.  TREATMENT PLAN/RECOMMENDATIONS:  Voluntary admission to [facility] for stabilization, 15-minute checks for safety.  We will resume his Zyprexa 10 mg q.h.s.  We will offer a Librium taper to make sure there is no unaddressed withdrawal.  Librium 25 mg t.i.d. on May 19, b.i.d. on May 20, q.d. on May 21, then discontinue, hold if sedated.  Trazodone 50-100 mg q.h.s. p.r.n. for insomnia.  TENTATIVE LENGTH OF STAY/DISCHARGE PLANS:  Two to three days, with follow-up at [facility]. DD:  10/12/99 TD:  10/14/99 Job: [id number]

## 2010-10-11 NOTE — H&P (Signed)
NAME:  [name], [name]                         ACCOUNT NO.:  [id number]   MEDICAL RECORD NO.:  [id number]                   PATIENT TYPE:  IPS   LOCATION:  0306                                 FACILITY:  [facility]   PHYSICIAN:  Geoffery Lyons, M.D.                   DATE OF BIRTH:  11/23/1960   DATE OF ADMISSION:  07/27/2002  DATE OF DISCHARGE:  08/09/2002                         PSYCHIATRIC ADMISSION ASSESSMENT   IDENTIFYING INFORMATION:  This is a 50 year old African American male who is  single.  This is a voluntary admission.   HISTORY OF PRESENT ILLNESS:  This patient stopped his medications about a  week ago for reasons that are not clear.  He gives no explanation, simply  shrugs his shoulders.  He relapsed on alcohol and cocaine for the past three  days.  He is complaining of hearing voices telling him to walk in front of a  car and endorses suicidal ideation with these auditory hallucinations.  He  is concerned because he is not sure that his mother will take him back home  again.   PAST PSYCHIATRIC HISTORY:  The patient is followed at [facility].  This is one of multiple admissions with the last one being June 25, 2002.  He has been previously diagnosed schizoaffective disorder,  depressed type.  He has a history of suicide attempts in the past by walking  into traffic and by other means.   SOCIAL HISTORY:  This is a single [place] American male who has never  married.  No children.  He is not working.  He has been living with his  mother.   FAMILY HISTORY:  Unclear.   LEGAL CHARGES:  Unclear.   ALCOHOL AND DRUG HISTORY:  The patient has a history of both alcohol and  cocaine abuse.   PAST MEDICAL HISTORY:  The patient is followed at the [facility].   MEDICAL PROBLEMS:  He denies any current medical problems.  He has a past  medical history of having a recent uveitis which is resolved.   MEDICATIONS:  1. Prozac 40 mg  daily.  2. Geodon 60 mg p.o. b.i.d.  3. Symmetrel 100 mg p.o. b.i.d.  4. Trazodone 150-200 mg p.o. q.h.s.   DRUG ALLERGIES:  None.   POSITIVE PHYSICAL FINDINGS:  The patient's physical exam as done in the  emergency department and is generally unremarkable.   DIAGNOSTIC STUDIES:  CBC within normal limits, hemoglobin 14.6, hematocrit  43.2, MCV 90.3, and platelets 179,000.  His routine chemistry revealed a  very mildly decreased potassium of 3.4.  Other electrolytes are normal.  Liver enzymes are remarkable for elevated AST of 49.  His ALT is 29 and  total bilirubin 0.9.  The thyroid panel is within normal limits.  His vital  signs were within normal limits at the time of admission.   MENTAL STATUS  EXAMINATION:  This is a tall, well-built, large-built African  American male who is in no acute distress.  He is fully alert.  He has a  blunted affect, but is generally cooperative and polite.  Speech is normal.  Mood is somewhat irritable and depressed.  Thought process is logical.  He  is goal directed.  He is complaining auditory hallucinations, but does not  appear to be internally distracted at this time and has fairly good focus  and concentration.  Positive for suicidal ideation.  No evidence of  homicidal ideation.  Cognitively he is intact and oriented x 3.  His insight  is poor.  Judgment is poor.  His impulse control is questionable.  Intelligence average.   DIAGNOSES:   AXIS I:  1. Schizoaffective disorder.  2. Depressed.  3. Polysubstance abuse.   AXIS II:  Deferred.   AXIS III:  1. Elevated SGOT.  2. Mild hypokalemia.   AXIS IV:  Deferred.   AXIS V:  Global Assessment of Functioning:  Current 38 and past year 74.   PLAN:  Voluntarily admit the patient to detoxify him from alcohol and to  alleviate his suicidal ideation.  We plan on restarting his Geodon at 40 mg  p.o. b.i.d. and Prozac at 40 mg daily.  We are going to ask the case manager  to evaluate his home  situation.  Meanwhile, we are going to give him K-Dur  20 mEq daily for two days in a row and start him on Symmetrel 100 mg p.o.  b.i.d. for his cocaine addiction.  We are also starting him on a Librium low-  dose protocol, which so far he is tolerating well.  We have explained the  medications to him and the risks, benefits, and indications for p.r.n. on  the detoxification protocol.  He has asked some pertinent questions and is  in agreement.     Margaret A. Scott, N.P.                   Geoffery Lyons, M.D.    MAS/MEDQ  D:  10/27/2002  T:  10/27/2002  Job:  [id number]

## 2010-10-11 NOTE — H&P (Signed)
NAME:  [name], [name] NO.:  [id number]   MEDICAL RECORD NO.:  [id number]                   PATIENT TYPE:  IPS   LOCATION:  0304                                 FACILITY:  [facility]   PHYSICIAN:  Jeanice Lim, M.D.              DATE OF BIRTH:  [birth year]   DATE OF ADMISSION:  06/09/2003  DATE OF DISCHARGE:                         PSYCHIATRIC ADMISSION ASSESSMENT   DATE OF ASSESSMENT:  June 09, 2003   PATIENT IDENTIFICATION:  This is a 50 year old African-American male who is  single.  This is a voluntary admission.   HISTORY OF PRESENT ILLNESS:  This patient with a history of bipolar disorder  and polysubstance abuse presented in the emergency department agitated,  saying that he had a panic attack at his sister's house and he attributed  this to his impending plans to leave for [facility].  He does admit that  he had been to a party the day before and had been drinking but is pretty  proud of the fact that he has been able to stay clean from cocaine.  His  alcohol level was 175 mg/% in the emergency department.  He was given 20 mg  Geodon for agitation at that time.  He reports staying clean from cocaine.  His longest period sober in the past is not clear.  Today, he denies any  homicidal or suicidal thoughts, denies any auditory and visual  hallucinations.  He does admit he has not been taking his medications that  he was previously discharged on and feels that he needs to get back on his  medications to be stable.  He does endorse feeling anxious and a little bit  nervous today, feels somewhat guilty for relapsing on alcohol.   PAST PSYCHIATRIC HISTORY:  This is the patient's 12th admission to [facility] since [facility] and his fourth admission in 2004.  He has a history of cocaine and alcohol abuse.  Longest period clean and  sober not clear.   SUBSTANCE ABUSE HISTORY:  The patient has a prior history of alcohol abuse  and cocaine abuse.  His urine drug screen was negative in the emergency  room.   PAST MEDICAL HISTORY:  The patient has no regular primary care [facility].  He  denies any current medical problems.  Past medical history is remarkable for  frequent hospitalizations for his bipolar disorder.  He denies any history  of seizures associated with alcohol withdrawal or otherwise.  He denies any  history of prior surgeries or hospitalizations.   MEDICATIONS:  The patient was previously on Prozac, Vistaril, and Risperdal  but he reports he has not been taking these for more than a month.  He also  has previously been managed on Vistaril for anxiety and Geodon for mood  lability and auditory hallucinations.   DRUG ALLERGIES:  No known drug allergies.   REVIEW OF SYSTEMS:  His  review of systems is essentially negative.   PHYSICAL EXAMINATION:  GENERAL:  Please see the physical examination that  was done in the emergency room and we find no specific new findings today.  He is a well nourished, well developed African-American male.  He is 6 feet  2 inches tall, 195 pounds.  He is generally healthy in appearance.  VITAL SIGNS:  Temperature 97.4, pulse 83, respirations 18, blood pressure  121/64.  NEUROLOGIC:  Exam was nonfocal.  EOM are intact with no nystagmus.  Cranial  nerves II-XII are intact.  Grip strength equal bilaterally.  Gait is normal.  Motor is smooth without any tremor.  Sensory is grossly intact.  Generally  nonfocal neurologic exam.   LABORATORY DATA:  CBC is remarkable for a slightly decreased WBC at 3.7;  otherwise unremarkable.  Chemistry panel is within normal limits.  Acetaminophen and salicylate levels were negative.  UDS was negative.  Alcohol level was 175.   SOCIAL HISTORY:  This is a single African-American male who is currently  living with his parents and planning to relocate to [place] where he  previously lived several years ago.  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   FAMILY HISTORY:  Family history is not clear.   MENTAL STATUS EXAM:  This is a well nourished, well developed, tall, well  groomed African-American male who is composed, pleasant with quick smile,  fully alert, no acute distress.  He has been up and about and interacting in  the day room.  Speech is normal without pressure, normal in pace, tone, and  amount.  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  His thinking is logical and  coherent, goal oriented.  He reflects and weighs the pros and cons of moving  to [place] versus staying at his parents' home.  No flight of ideas or  paranoia, no ideas of reference, no clear signs of internal distractions, no  signs of auditory and visual hallucinations, no clear suicidal or homicidal  thought.  Cognitive: Intact and oriented x 3.  Intelligence is average.  Insight is partial.  Impulse control and judgment: Guarded.   ADMISSION DIAGNOSES:   AXIS I:  1. Bipolar disorder, not otherwise specified.  2. Alcohol abuse, rule out dependence.  3. Cocaine abuse in partial remission.   AXIS II:  Deferred.   AXIS III:  No diagnosis.   AXIS IV:  Deferred.   AXIS V:  Current 39, past year 83.   INITIAL PLAN OF CARE:  Plan is to voluntarily admit the patient with q.73m.  checks in place.  He is in dual diagnosis programming and has been  participating appropriately in group and individual psychotherapy.  Because  he was given Geodon 20 mg IM in the emergency room, we will continue with 60  mg p.o. b.i.d.  We will also give him  Vistaril 50 mg t.i.d. p.r.n. for anxiety and Librium 25 mg p.o. q.6.h.  p.r.n. for withdrawal symptoms.  We have discussed the plan of care with him  and he is in agreement.   ESTIMATED LENGTH OF STAY:  Five days.    Margaret A. Stephannie Peters                   Jeanice Lim, M.D.    MAS/MEDQ   D:  06/09/2003  T:  06/09/2003  Job:  [id number]

## 2010-10-11 NOTE — Discharge Summary (Signed)
[facility]  Patient:    [name], [name] Visit Number: [id number] MRN: [id number]          Service Type: PSY Location: 400 [id number] Attending Physician:  Jeanice Lim Dictated by:   Jeanice Lim, M.D. Admit Date:  08/11/2001 Discharge Date: 08/12/2001                             Discharge Summary  IDENTIFYING DATA:  This is a 50 year old single male.  The patient was admitted with overt psychosis and schizoaffective disorder, agitated, delusional.  MEDICATIONS:  None.  ALLERGIES:  No known drug allergies.  PHYSICAL EXAMINATION:  Essentially within normal limits.  Neurologically nonfocal.  LABORATORY DATA:  Routine admission labs were not obtained.  MENTAL STATUS EXAMINATION:  The patient was agitated, aggressive, released from prison for resisting arrest with a history of violence.  ADMISSION DIAGNOSES: Axis I:    Schizoaffective disorder. Axis II:   Personality disorder not otherwise specified. Axis III:  None. Axis IV:   Severe (problems with social environment, occupational, housing and            legal system). Axis V:    50/65.  HOSPITAL COURSE:  The patient was more cooperative once being admitted but clearly had a history requiring longer-term inpatient treatment.  The patient was set up for a transfer to [facility] for longer-term treatment. The patient reported auditory hallucinations and strong suicidal ideation, unable to contract for safety for extended periods of time, usually taking several weeks to improved.  CONDITION ON DISCHARGE:  Unimproved condition to transfer to [facility] for longer-term treatment.  DISCHARGE MEDICATIONS:  To be further evaluated by [facility].  DISCHARGE DIAGNOSES: Axis I:    Schizoaffective disorder. Axis II:   Personality disorder not otherwise specified. Axis III:  None. Axis IV:   Severe (problems with social environment, occupational, housing and            legal  system). Axis V:    Global Assessment of Functioning on admission and discharge 30. Dictated by:   Jeanice Lim, M.D. Attending Physician:  Jeanice Lim DD:  09/23/01 TD:  09/25/01 Job: [id number]

## 2010-10-11 NOTE — Discharge Summary (Signed)
NAME:  [name], [name]               ACCOUNT NO.:  [id number]   MEDICAL RECORD NO.:  [id number]          PATIENT TYPE:  IPS   LOCATION:  0304                          FACILITY:  [facility]   PHYSICIAN:  Jeanice Lim, M.D. DATE OF BIRTH:  [birth year]   DATE OF ADMISSION:  07/26/2005  DATE OF DISCHARGE:  07/29/2005                                 DISCHARGE SUMMARY   IDENTIFYING DATA:  This is a 50 year old single African-American male  admitted multiple previous times to [facility], presented in  the emergency room at [facility] yesterday reporting that he had a fight  with his ex-girlfriend approximately a week ago, was kicked out of the  house, been off his medications, reported a plan to hurt himself as well as  the girlfriend, having suicidal and homicidal thoughts, stated that he had  possible access to a gun with little effort and has a history of 2 prior  suicide attempts, acknowledges using alcohol and cocaine for the past 5 days  and indicated that he had stopped his lithium.  Urine drug screen was  positive for opiates and cocaine, and lithium level was 0.25  Past  psychiatric history:  Last admission in February 27, 2005 to October 11 for  similar presentation, often admitted has difficulty getting out of bed, worn  out, run down, with flu-like symptoms for a day or two, then perks up and  starts participating in treatment and then shows dramatic improvement once  detoxed, medically stabilized.   ALLERGIES:  No known drug allergies.   PHYSICAL AND NEUROLOGICAL EXAMINATION:  Performed in emergency room, within  normal limits.  Review of systems within normal limits.   ROUTINE ADMISSION LABS:  Within normal limits with positive urine drug  screen noted above.   MENTAL STATUS EXAM:  In bed and not responding, alert and oriented x3, odor  consistent with having been living on the streets for a few days, answering  questions with one or two word answers.  Thought  process was goal directed,  somewhat psychomotor slowed, and guarded, describing mood instability,  feeling highs and lows and feeling guilty and frustrated regarding his  addiction, still lacking some insight regarding the severity of his  addiction and the need for a different type of relapse prevention plan.  Judgment and insight were impaired, continued to report suicidal and  homicidal thoughts at the time of admission evaluation.   ADMISSION DIAGNOSES:  AXIS I:  Bipolar disorder, rule out substance-induced  mood disorder superimposed on bipolar disorder, polysubstance abuse and  possible alcohol and cocaine dependency with cocaine being drug of choice,  and chronic noncompliance with medications and followup after relapsing.  AXIS II:  Deferred.  AXIS III:  None.  AXIS IV:  Moderate, multiple stressors and psychosocial issues, homeless,  financial stress, limited support system and other psychosocial sequelae.  AXIS V:  35/55.Marland Kitchen   HOSPITAL COURSE:  The patient was admitted and ordered routine p.r.n.  medications, underwent further monitoring, and was encouraged to participate  in individual, group and milieu therapy.  Medications were reconciled and  then  resumed, and the patient was stabilized on medications, reported to  have rapid improvement in mood stabilization.  Hinda Glatter was started and the  patient was willing to take it.  In the past he had sometimes refused  Risperdal.  The patient showed continued improvement and was interested in  being discharged to the shelter, not interested in a long-term out of the  area substance abuse treatment despite the need of likely long-term  residential treatment, however the patient did participate in aftercare  planning and his motivation to remain abstinent was partial, with partial  insight and therefore prognosis was guarded.  However there was no suicidal  or homicidal ideation and no acute withdrawal symptoms and the patient's   condition was stabilized at the time of discharge.  The patient was given  medication education and discharged on:  1.  Lithium carbonate 300 mg q.a.m. and q.h.s.  2.  Ambien 10 mg q.h.s. p.r.n. insomnia.  3.  Invega 3 mg q.a.m.   DISPOSITION:  The patient was discharged to follow up at the [facility] on  Tuesday, March 13 at 1:00 p.m.   DISCHARGE DIAGNOSES:  AXIS I:  Bipolar disorder, rule out substance-induced  mood disorder superimposed on bipolar disorder, polysubstance abuse and  possible alcohol and cocaine dependency with cocaine being drug of choice,  and chronic noncompliance with medications and followup after relapsing.  AXIS II:  Deferred.  AXIS III:  None.  AXIS IV:  Moderate, multiple stressors and psychosocial issues, homeless,  financial stress, limited support system and other psychosocial sequelae.  AXIS V:  Global assessment of functioning on discharge was 55.      Jeanice Lim, M.D.  Electronically Signed     JEM/MEDQ  D:  08/24/2005  T:  08/25/2005  Job:  [id number]

## 2010-10-11 NOTE — Discharge Summary (Signed)
NAME:  [name], [name] NO.:  [id number]   MEDICAL RECORD NO.:  [id number]                   PATIENT TYPE:  IPS   LOCATION:  0304                                 FACILITY:  [facility]   PHYSICIAN:  Jeanice Lim, M.D.              DATE OF BIRTH:  [birth year]   DATE OF ADMISSION:  11/27/2002  DATE OF DISCHARGE:  12/07/2002                                 DISCHARGE SUMMARY   IDENTIFYING DATA:  This is a 50 year old single African-American male,  voluntarily admitted, history of manic behavior, using alcohol  and crack  cocaine, recently released from jail, tired of living, reporting auditory  hallucinations to kill himself.   ADMISSION MEDICATIONS:  Prozac, Risperdal, off his medications for 2 weeks.   ALLERGIES:  No known drug allergies.   PHYSICAL EXAMINATION:  Within normal limits, neurologically nonfocal.   ROUTINE ADMISSION LABS:  CBC and CMET within normal limits.  Urine drug  screen positive for cocaine.  Alcohol level 57.   MENTAL STATUS EXAM:  Alert, tall male, passively cooperative.  Speech soft  spoken, mood depressed and anxious.  Thought process positive for auditory  hallucinations, visual hallucinations and suicidal thoughts prior to  admission, denying current symptoms.  Cognition intact.  Judgment and  insight poor, with a history of poor impulse control.   ADMISSION DIAGNOSES:   AXIS I:  1. Bipolar disorder.  2. Alcohol dependence.  3. Cocaine dependence.   AXIS II:  Deferred.   AXIS III:  Hyperglycemia.   AXIS IV:  Moderate problems with support system.   AXIS V:  35/55.   HOSPITAL COURSE:  The patient was admitted and ordered routine p.r.n.  medications, underwent further monitoring, and was encouraged to participate  in individual, group and milieu therapy.  The patient was resumed on Prozac  and Risperdal, given Ambien p.r.n., and Prozac and Risperdal were optimized.  The patient was reporting continued suicidal  ideation, was somewhat  disruptive on the unit, medication seeking and clearly required longer term  inpatient treatment, therefore a transfer to [facility] was  considered in the patient's best interest, for further stabilization and  monitoring and he was transferred on July 14.   CONDITION ON DISCHARGE:  Slightly improved.  He was to continue medications  and continue suicide monitoring and stabilization and then after discharge  follow up with [facility]   DISCHARGE DIAGNOSES:   AXIS I:  1. Bipolar disorder.  2. Alcohol dependence.  3. Cocaine dependence.   AXIS II:  Deferred.   AXIS III:  Hyperglycemia.   AXIS IV:  Moderate problems with support system.   AXIS V:  Global assessment of function on discharge was 30.  Jeanice Lim, M.D.    JEM/MEDQ  D:  01/11/2003  T:  01/12/2003  Job:  [id number]

## 2010-10-11 NOTE — Discharge Summary (Signed)
NAME:  [name], [name]                         ACCOUNT NO.:  [id number]   MEDICAL RECORD NO.:  [id number]                   PATIENT TYPE:  IPS   LOCATION:  0306                                 FACILITY:  [facility]   PHYSICIAN:  Geoffery Lyons, M.D.                   DATE OF BIRTH:  05/05/1961   DATE OF ADMISSION:  07/27/2002  DATE OF DISCHARGE:  08/09/2002                                 DISCHARGE SUMMARY   CHIEF COMPLAINT AND PRESENT ILLNESS:  This was the one of multiple  admissions to [facility] for this 50 year old  African-American male, single, voluntarily admitted.  He had no medications  for over a week prior to this admission for reasons not clear.  He relapsed  on alcohol and cocaine for three days, hearing voices telling him to hurt  himself, to walk out in front of a car.  He was not sure if his mother would  take him back.  He endorsed suicidal ideas with auditory hallucinations.   PAST PSYCHIATRIC HISTORY:  He had multiple admissions to [facility], [facility].  He had a  history of suicide attempts.   SUBSTANCE ABUSE HISTORY:  History of alcohol and cocaine dependence; recent  relapse.   PAST MEDICAL HISTORY:  Noncontributory.   MEDICATIONS:  1. Prozac 40 mg per day.  2. Geodon 60 mg twice a day.  3. Symmetrel 100 mg twice a day.  4. Trazodone 150 mg for sleep that he was not taking.   PHYSICAL EXAMINATION:  Physical examination was performed, failed to show  any acute findings.   MENTAL STATUS EXAM:  Mental status exam revealed a tall, large built male,  fully alert, depressed mood, blunted affect, cooperative.  Speech: Normal  but no spontaneous production.  Somewhat irritable.  Affect: Depressed and  irritable.  Thought processes: Goal directed.  He complained of auditory  hallucinations, suicidal ideas with plans to kill himself.  Cognitive:  Cognition was well preserved.   ADMISSION  DIAGNOSES:   AXIS I:  1. Schizoaffective disorder, depressed.  2. Alcohol and cocaine dependence.   AXIS II:  No diagnosis.   AXIS III:  Elevated serum glutamic-oxaloacetic transaminase.   AXIS IV:  Moderate.   AXIS V:  Global assessment of functioning upon admission 25, highest global  assessment of functioning in the last year 50-55.   LABORATORY DATA:  Other laboratory workup: CBC was within normal limits.  Blood chemistries were within normal limits.  SGOT 49, SGPT 29.  Thyroid  profile was within normal limits.   HOSPITAL COURSE:  He was admitted and started in intensive individual and  group psychotherapy.  He was placed back on his medications.  He was  detoxified with Librium.  He was placed on Geodon that was increased to 160  mg per day.  He was given some  Vistaril at bedtime for sleep, Symmetrel 100  mg twice a day for cravings.  He claimed increased depression, hearing the  voices, suicidal ideas, kicked out by his sister, claimed he relapsed  afterward but there was a strong suspiciousness that he relapsed and then he  was kicked out.  He had some paranoia.  He tolerated the Geodon well.  We  went ahead and started increasing it.  He still continued to endorse  suicidal ideas.  He was in bed, not going to groups, he was avoiding, so we  pushed him to get up, go to group, and work on a relapse prevention plan.  He continued to endorse the suicidal ideation, the depression, the auditory  hallucinations, but slowly as the medications got in his system, he started  evidencing admitting to decrease in the voices.  He was more interactive,  going to groups, interacting with other clients.  By March 16, he was in  full contact with reality, no suicidal ideas, no homicidal ideas, no  hallucinations, back on the medications, claimed that he was going to follow  up this time around and that he was going to pursue long-term abstinence.  He was going to be working through the  Barrister's clerk at [facility] ways of finding a residential treatment program, long-  term treatment facility.   DISCHARGE DIAGNOSES:   AXIS I:  1. Schizoaffective disorder, depressed.  2. Alcohol and cocaine dependence.   AXIS II:  No diagnosis.   AXIS III:  No diagnosis.   AXIS IV:  Moderate.   AXIS V:  Global assessment of functioning upon discharge 55.   DISCHARGE MEDICATIONS:  1. Symmetrel 100 mg twice a day.  2. Geodon 80 mg twice a day.  3. Vistaril 100 mg at bedtime.   FOLLOW UP:  He was to follow up with [facility].                                               Geoffery Lyons, M.D.    IL/MEDQ  D:  09/06/2002  T:  09/06/2002  Job:  [id number]

## 2010-10-11 NOTE — Discharge Summary (Signed)
NAMEKHYRE, GERMOND               ACCOUNT NO.:  [id number]   MEDICAL RECORD NO.:  [id number]          PATIENT TYPE:  IPS   LOCATION:  0506                          FACILITY:  [facility]   PHYSICIAN:  Anselm Jungling, MD  DATE OF BIRTH:  03-23-1961   DATE OF ADMISSION:  05/01/2007  DATE OF DISCHARGE:  05/10/2007                               DISCHARGE SUMMARY   IDENTIFYING DATA/REASON FOR ADMISSION:  This was an inpatient  psychiatric admission for Notnamed, a 50 year old African American male  who presented with depression, suicidal ideation, in the context of  cocaine and alcohol abuse.  Please refer to the admission note for  further details pertaining to the symptoms, circumstances, and history  that led to his hospitalization.  He was given initial Axis I diagnosis  of alcohol dependence, cocaine dependence, and substance-induced mood  disorder.   LABORATORY:  The patient was medically and physically assessed by the  psychiatric nurse practitioner.  He was in good health without any  active or chronic medical problems.  There were no significant medical  issues.   HOSPITAL COURSE:  The patient was admitted to the adult inpatient  psychiatric service.  He presented as a well-nourished, well-developed  male who was alert, fully oriented, but anxious, tremulous, and who felt  hopeless.  He denied suicidal ideation.  There were no signs or symptoms  of psychosis or delirium.  He verbalized a strong desire for help.   He was detoxified utilizing a Librium taper.  His detoxification  proceeded rather uneventfully.  As his withdrawal symptoms abated, he  continued very depressed.  He needed encouragement to attend therapeutic  groups and activities.   On the fourth hospital day, the patient was up, active and more visible  in the milieu, but because he was predicting that he would remain  homeless following discharge, he indicated that he was still having  suicidal thoughts and  predicted he would have further suicidal thoughts.   Part of his distress was over his belief that his mother would not let  him return to live in her home.  His mother was indicating that he  needed to achieve full and stable recovery in sobriety as a precondition  for his returning home.  He agreed that he needed chemical dependency  treatment.  He took this is a rejection on the part of his mother,  although we felt that her stance was quite reasonable and appropriate.   The patient continued in the inpatient program, and was a good  participant in therapeutic groups and activities, including 12-step  groups.  Eventually, he and his mother were able to reconcile their  relationship, and his mood brightened considerably.  The patient was  assisted in contacting [facility], and a plan was developed towards  eventual discharge.   The patient was appropriate for discharge on the seventh hospital day.  He agreed to the following aftercare plan.   AFTERCARE:  The patient was to follow-up at [facility] mental  health with an appointment on 05/18/2007 for medication management.   DISCHARGE MEDICATIONS:  The  patient had been restarted on the following  medications:  1. Neurontin 100 mg q.i.d.  2. Lithium carbonate 600 mg b.i.d.   DISCHARGE DIAGNOSES:  AXIS I: Bipolar disorder NOS, alcohol dependence,  early remission, cocaine dependence, early remission.  AXIS II: Deferred.  AXIS III: No acute or chronic illnesses.  AXIS IV: Stressors severe.  AXIS V: GAF on discharge 60.      Anselm Jungling, MD  Electronically Signed     SPB/MEDQ  D:  06/03/2007  T:  06/04/2007  Job:  [phone] [id number]

## 2010-10-11 NOTE — H&P (Signed)
[name], [name]               ACCOUNT NO.:  [id number]   MEDICAL RECORD NO.:  [id number]          PATIENT TYPE:  IPS   LOCATION:  0305                          FACILITY:  [facility]   PHYSICIAN:  Geoffery Lyons, M.D.      DATE OF BIRTH:  11/06/1960   DATE OF ADMISSION:  06/03/2004  DATE OF DISCHARGE:                         PSYCHIATRIC ADMISSION ASSESSMENT   IDENTIFYING INFORMATION:  This is a 50 year old, single, African-American  male who is voluntarily admitted on June 03, 2004.   HISTORY OF PRESENT ILLNESS:  The patient presents with a history of suicidal  thoughts with plans to cut himself.  Patient states his life is all messed  up.  He has been feeling depressed and hopeless, isolating himself.  He  denies any alcohol or drug use for the past 3 months.  He reports he has  been eating instead with a weight gain of over 30 pounds.  He states that he  is currently psychic, able to predict the future.  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  The patient also expresses some paranoid ideation  to having his blood drawn.   PAST PSYCHIATRIC HISTORY:  Patient has had multiple hospitalizations at  [facility], last hospitalized in November of 2005.  He was  hospitalized elsewhere at [facility] and [facility].   SOCIAL HISTORY:  This is a 49 year old, single, African-American male with  no children.  He lives with his mother and 2 sisters.   FAMILY HISTORY:  None.   ALCOHOL/DRUG HISTORY:  Nonsmoker.  Denies any alcohol or drug use for the  past 3 months.   PRIMARY CARE [place] Van:  None.   PAST MEDICAL HISTORY:  None.   MEDICATIONS:  1.  Has been taking Lunesta 3 mg at bedtime.  2.  Was on Prozac and Zyprexa in the past as well as Vistaril.  Has been      noncompliant with those medications.   DRUG ALLERGIES:  No known drug allergies.   REVIEW OF SYSTEMS:  Noncontributory.  No chest pain, no shortness of breath.  Nonsmoker.  Has a history of  bipolar disorder with past history of drug use.   PHYSICAL EXAMINATION:  VITAL SIGNS:  Temperature is 98; 85 heart rate; 18  respirations; blood pressure is 127/67; 227 pounds; 6 feet 3 inches tall.  GENERAL:  This is a well-nourished male in no acute distress.  NECK:  Negative lymphadenopathy.  CHEST:  Clear.  HEART RATE:  Regular rate and rhythm.  ABDOMEN:  Soft and bowel sounds are present.  EXTREMITIES:  Patient moves all extremities.  There is no clubbing, no  deformities.  Strong 5+ against resistance.  SKIN:  Warm and dry.  NEUROLOGIC:  Findings are intact.  Nonfocal.   Patient did agree to blood draw the next day.  Glucose was 133.  WBC  concentrated at __________.  TSH is 0.308.   MENTAL STATUS EXAM:  He is in the bed, somewhat sleepy.  Fair eye contact.  Speech is clear, somewhat monotone.  Mood is hopeless.  Affect is flat.  Thought processes - Patient reporting symmetrical thinking, delusional with  some paranoid ideation.  Cognitive function intact.  Memory is fair.  Judgment and insight are poor.   ADMISSION DIAGNOSES:   AXIS I:  1.  Bipolar disorder with psychotic features.  2.  Alcohol and cocaine abuse with partial remission.   AXIS II:  Deferred.   AXIS III:  None.   AXIS IV:  Other psychosocial problems related to mental illness,  noncompliance with medications.   AXIS V:  Current is 30; past year 55 to 43.   PLAN:  Admission for suicidal ideation, psychotic symptoms, contract for  safety, stabilize mood and thinking.  We will initiate a mood stabilizer,  encourage medication compliance, encourage group therapy and consider a  family session with the patient's support group.  Patient is to follow-up  with mental health.   TENTATIVE LENGTH OF STAY:  Four to six days.      JO/MEDQ  D:  06/12/2004  T:  06/12/2004  Job:  [id number]
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
## 2010-10-11 NOTE — H&P (Signed)
NAME:  KYLAN, [name] NO.:  [id number]   MEDICAL RECORD NO.:  [id number]                   PATIENT TYPE:  IPS   LOCATION:  0304                                 FACILITY:  [facility]   PHYSICIAN:  Geoffery Lyons, M.D.                   DATE OF BIRTH:  05-02-1961   DATE OF ADMISSION:  11/27/2002  DATE OF DISCHARGE:                         PSYCHIATRIC ADMISSION ASSESSMENT   IDENTIFYING INFORMATION:  A 50 year old single African-American male  voluntarily admitted on November 27, 2002.   HISTORY OF PRESENT ILLNESS:  The patient presents with a history of manic  behavior, using alcohol and crack cocaine.  He reports that he recently was  released from jail for trespassing.  He relapsed on alcohol and drug use.  He states he is tired of living.  The patient was having some suicidal  thoughts to jump in front of traffic.  He states the police brought him to  the emergency department.  Also having positive auditory hallucinations  telling him to cut his wrists or jump into traffic.  The patient was also  seeing spirits, but denies any current hallucinations at this time.  He  reports decreased sleep, decreased appetite with no reported weight loss,  and has been noncompliant with his medications.   PAST PSYCHIATRIC HISTORY:  Multiple admissions to [facility],  was an outpatient at [facility] but did follow up with  his appointments.  Has a history of jumping through a window.   SOCIAL HISTORY:  He is a 50 year old single African-American male.  He  states he was in jail 2 weeks recently for trespassing, was released 4 days  ago, and the patient reports that he is currently homeless.   FAMILY HISTORY:  Unknown.   ALCOHOL DRUG HISTORY:  The patient smokes cigarettes.  He began drinking and  using cocaine about 4 days.   PAST MEDICAL HISTORY:  Primary care Catalia Massett is none.  Medical problems are  hypoglycemia.   MEDICATIONS:  Has been  on Prozac and Risperdal in the past but has been off  his medications for at least 2 weeks.   DRUG ALLERGIES:  No known allergies.   PHYSICAL EXAMINATION:  Done at [facility].  He is in  bed and in no acute distress.  Denies any withdrawal symptoms and shows no  signs of tremors.  CBC is within normal limits.  CMET shows a potassium of  3.4.  Urine drug screen was positive for cocaine.  Alcohol level was 57.   MENTAL STATUS EXAM:  He is an alert, tall male.  He is in bed, passively  cooperative, little eye contact.  Speech is soft-spoken but clear.  Mood is  depressed and anxious, affect is flat.  Thought processes are positive  auditory hallucinations, positive visual hallucinations, and positive  suicidal thoughts prior to admission.  Denying any current symptoms.  No  homicidal ideation, no paranoid ideation.  Cognitive function intact.  Memory is fair, judgment and insight are poor, poor impulse control.   ADMISSION DIAGNOSES:    AXIS I:  1. Bipolar disorder.  2. Alcohol dependence.  3. Cocaine dependence.   AXIS II:  Deferred.   AXIS III:  Hypoglycemia.   AXIS IV:  Problems with lack of support, boredom, housing problems, legal  system, other psychosocial problems.   AXIS V:  Current is 35, this past year 44.   PLAN:  Voluntary admission for bipolar disorder, alcohol and cocaine abuse,  psychosis and suicidal thoughts.  Contract for safety, check every 15  minutes.  Stabilize mood and thinking so the patient can be safe.  Will  resume his prior medications.  The patient is to attend groups to increase  his coping skills.  Case worker to look at housing arrangements and  potential rehab.  The patient is to remain alcohol and drug free and be  medication compliant.  The patient is to follow up with mental health.   TENTATIVE LENGTH OF CARE:  4-6 days.     Landry Corporal, [name].P.                       Geoffery Lyons, M.D.    JO/MEDQ  D:  11/28/2002   T:  11/28/2002  Job:  [id number]

## 2010-10-11 NOTE — Discharge Summary (Signed)
[facility]  Patient:    [name], [name] Visit Number: [id number] MRN: [id number]          Service Type: EMS Location: [place] [place] Attending Physician:  Cathren Laine Dictated by:   Carolanne Grumbling, M.D. Admit Date:  10/24/2001 Discharge Date: 10/24/2001                             Discharge Summary  PATIENT IDENTIFICATION:  The patient was a 50 year old male.  INITIAL ASSESSMENT AND DIAGNOSIS:  The patient was admitted to the service of Dr. Kathrynn Running.  I was on call the last few days prior to his discharge but the bulk of the treatment was done prior to my arriving on the unit.  He was admitted because of a history of depression and suicidal plans to overdose on his medications.  His grandmother had died on 10-20-2022 prior to admission.  He had begun drinking again after six months history of sobriety and had been having positive auditory hallucinations to hurt himself.  He also had decreased sleep for three days prior to admission, decreased appetite, he was nauseated and not particularly talkative.  He had a long history of hospitalizations, the last one being in the month prior to this admission.  He currently lives with his parents and was on disability.  MENTAL STATUS UPON INITIAL EVALUATION:  Revealed a patient who was in his bed with the sheets up to his eyes.  He stated he was too nauseated to come out for an interview.  Mood was depressed.  Affect was flat.  He apparently admitted to positive auditory hallucinations.  He denied any homicidal or suicidal thoughts.  There was no evidence of any other paranoia.  Judgment seemed fair.  ADMISSION DIAGNOSES: Axis I:    1. Bipolar disorder.            2. Alcohol abuse.            3. Rule out schizoaffective disorder. Axis II:   Deferred. Axis III:  No diagnosis. Axis IV:   Severe. Axis V:    30/65  FINDINGS:  All indicated laboratory examinations were within normal limits  or noncontributory.  HOSPITAL COURSE:  While in the hospital, the patient continued to insist that he was grieving the loss of his grandmother.  He resumed his medications Zoloft which initially was 50 mg a day and by the time of discharge was up to 200 mg a day.  He had tried Zyprexa but he believed it made him more nauseated.  He also was x-rayed for a hand injury he had gotten prior to his admission and no broken bones were noted.  He was treated with Vioxx for that. It was hard to judge exactly how the patient was feeling.  He seemed to know the right things to say to stay in the hospital and there was always some skepticism as to how legitimately he remained just as depressed as when he came and just as grieving.  The usual talking about a person who died and the relief patients feel when they do discuss a loss of a person as well as the positive things that a person has left behind for them did not seem to modify his saying every day that if he had to leave he would be suicidal. Nevertheless, toward the end of his stay, it appeared that he was not suicidal.  He did say that what he believed  was that if he went out he would start using again and if he used again, he might become suicidal.  All the usual places such as shelters were closed to him he said because he had been to all of them and they did not want him back.  He did not want to go back to his parents house because they were fairly strict religious adherents and he did not follow their religion.   He talked fairly extensively about new age like beliefs.  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  He was seen by Dr. Lourdes Sledge for a second opinion about discharge simply because it seemed that he should be ready to go but he was claiming that he was not.  Nevertheless, on the day of discharge, he worked with the case manager who helped him find a place that was acceptable  to him and at that point, he agreed to discharge.  He was denying any suicidal thoughts or homicidal thoughts and he believed that if he were in this particular residence, he would probably be less likely to use again and consequently less dangerous to himself in the future.  POST HOSPITAL CARE PLAN:  He was to follow up at the [facility] with an appointment for May 27 at 2 p.m.  There were no restrictions placed on his activity or his diet.  DISCHARGE MEDICATIONS:  At the time of discharge he was taking: 1. Abilify 15 mg at bedtime. 2. Zoloft 200 mg daily. 3. Ambien 10 mg at bedtime as needed.  FINAL DIAGNOSES: Axis I:    1. Bipolar disorder by history.            2. Alcohol abuse. Axis II:   Rule out personality disorder. Axis III:  No diagnosis. Axis IV:   Moderate Axis V:    50 Dictated by:   Carolanne Grumbling, M.D. Attending Physician:  Cathren Laine DD:  11/15/01 TD:  11/15/01 Job: [id number]

## 2010-10-11 NOTE — Discharge Summary (Signed)
NAMEJOBANY, Perry               ACCOUNT NO.:  [id number]   MEDICAL RECORD NO.:  [id number]          PATIENT TYPE:  IPS   LOCATION:  0305                          [facility]ACILITY[facility]  [facility]   PHYSICIAN:  Jeanice Lim, M.D. DATE OF BIRTH:  1961-03-28   DATE OF ADMISSION:  06/03/2004  DATE OF DISCHARGE:  06/12/2004                                 DISCHARGE SUMMARY   IDENTIFYING DATA:  This is a 50 year old single African-American male,  voluntarily admitted January 9, with a history of suicidal thoughts, plan to  cut himself.  The patient reports life was all messed up, feeling depressed  and hopeless, isolating himself, denied alcohol or drug use for past 3  months, reported increased appetite with 30 pound weight gain.  The patient  had multiple hospitalizations at [facility], last hospitalized  in November 2005, hospitalized at [facility] and [facility].   MEDICATIONS:  Previously taken Lunesta, Prozac, Zyprexa, and Vistaril, been  noncompliant with medications.   ALLERGIES:  No known drug allergies.   PHYSICAL EXAMINATION:  Within normal limits, neurologically nonfocal.   ROUTINE ADMISSION LABS:  Essentially within normal limits.  TSH was 0.3.   ALCOHOL/DRUG HISTORY:  The patient denies any alcohol or drug use for the  past 3 months which is quite different, although at his last presentation he  had not relapsed either.  Substances in the past has been primarily cocaine,  his drug of choice, and also some alcohol.  The patient has been doing quite  well.  Again physical and neurologic exam essentially within normal limits.   MENTAL STATUS EXAM:  In bed, somewhat sleepy, fair eye contact.  Speech  somewhat monotone.  Mood hopeless, affect flat, the patient reporting goal  directed thinking with some delusional and paranoid thoughts.  Otherwise  cognitively intact.  Judgment and insight were poor.   ADMISSION DIAGNOSES:   AXIS I:  1.  Bipolar  disorder with psychotic features.  2.  Alcohol and cocaine abuse in partial remission.  The patient has a      history of complaining of feeling like he was God one moment and the      next moment feeling blowing his brains out with a shotgun.   AXIS II:  Deferred.   AXIS III:  None.   AXIS IV:  Moderate to severe psychosocial stressors related to mental  illness and noncompliance with medications, although by report the patient  has been abstinent from substance abuse and getting along better with his  family since he has stopped abusing substances, which is also different than  past presentations.   AXIS V:  30/55-60.   HOSPITAL COURSE:  The patient was admitted and ordered routine p.r.n.  medications, underwent further monitoring, and was encouraged to participate  in individual, group and milieu therapy.  The patient was resumed on  psychotropics previously responded to, placed on Equetro for mood  stabilization, Prozac, trazodone, Risperdal.  The patient was more agreeable  regarding taking medications, appeared to have increased insight regarding  the importance of treating his mood  instability.  The patient required  Percocet for pain and was discharged in improved condition.  Mood was  euthymic, affect brighter, judgment and insight improved.  There were no  psychotic symptoms.  The patient has some sense of future and reported  motivation to be compliant with the aftercare plan, understanding he had to  remain abstinent as well as be compliant with his psychotropics due to his  clear bipolar disorder which can have significant psychotic symptoms at  times.  The patient was given medication education and discharged on:  1.  Flexeril 10 mg 1/2 b.i.d. and q.h.s.  2.  Motrin 600 mg at q.8 p.r.n. pain.  3.  Loxitane 10 mg 2 q.9 p.m.  4.  Equetro 200 mg q.a.m. and 2 q.9 p.m.  5.  Prozac 10 mg q.a.m.  6.  Trazodone 25 mg q.9 p.m. and p.r.n. insomnia.  7.  Lunesta 30 mg q.h.s.  p.r.n. insomnia.   DISPOSITION:  The patient was to follow up with [facility]  with Dr.  Lang Snow Wednesday January 25 at 9:30.   DISCHARGE DIAGNOSES:   AXIS I:  1.  Bipolar disorder with psychotic features.  2.  Alcohol and cocaine abuse in partial remission.  The patient has a      history of complaining of feeling like he was God one moment and the      next moment feeling blowing his brains out with a shotgun.   AXIS II:  Deferred.   AXIS III:  None.   AXIS IV:  Moderate to severe psychosocial stressors related to mental  illness and noncompliance with medications, although by report the patient  has been abstinent from substance abuse and getting along better with his  family since he has stopped abusing substances, which is also different than  past presentations.   AXIS V:  Global assessment of function on discharge was 55-60.      JEM/MEDQ  D:  07/07/2004  T:  07/08/2004  Job:  [id number]
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
## 2010-10-11 NOTE — H&P (Signed)
[facility]  Patient:    [name], [name]                      MRN: [id number] Adm. Date:  [id number] Attending:  Denny Peon Dictator:   Candi Leash. Orsini, N.P.                         History and Physical  REASON FOR ADMISSION:  Schizoaffective disorder with suicidal ideation.  REVIEW OF SYSTEMS:  The patient reports he has been having some fever and chills on occasion with a change of appetite where he has lost 20 pounds over the last month, no visual problems.  No earache.  He does have a cavity in his left lower molar.  CARDIAC:  He has no complaint of chest pain, palpitations or syncope.  No history of hypertension.  He does smoke a few cigarettes a day and said he started this the past year.  RESPIRATORY:  No cough, orthopnea or shortness of breath.  GI:  No change in habits, no constipation or diarrhea. GU:  No dysuria, hematuria or discharge.  MUSCULOSKELETAL:  No stiffness or swelling or joint pains.  SKIN:  NO itching or bruising.  Some dry skin. NEUROLOGIC:  No weakness, tremors or seizures.  PSYCHIATRIC:  Some depression and loss of interest.  ENDOCRINE:  No hair changes or intolerance to weather. No enlarged or tender nodes.  No problems with allergies.  PHYSICAL EXAMINATION:  VITAL SIGNS:  Temperature 97, heart rate 98, respiratory rate 24, blood pressure 113/67.  The patient is 6 feet 1 inch and weighs 160 pounds.  GENERAL APPEARANCE:  The patient is a 50 year old black male sitting on exam table in no acute distress.  He is thin, appears his stated age.  He is cooperative, but unkempt with some body odor.  HEENT:  His head is normocephalic.  He can raise his eyebrows.  Eyes are equal and reactive to light.  Extraocular muscles are intact bilaterally.  His funduscopic exam is within normal limits.  ENT/MOUTH:  Tympanic membranes are intact.  He has no sinus tenderness. Mouth, mucosa is moist with good dentition.  No lesions  were seen.  His tongue protrudes midline without tremor.  He can clench his teeth and puff out his cheeks.  His uvula is midline.  No pharyngeal exudate is noted.  NECK:  Supple with full range of motion, negative lymphadenopathy, no JVD. His trachea is midline.  His thyroid id nonpalpable, nontender.  RESPIRATORY:  Breath sounds are clear to auscultation.  No adventitious sounds.  CARDIOVASCULAR:  Heart is regular rate and rhythm without murmurs.  Carotid pulses are equal and adequate bilaterally.  No carotid bruits were auscultated.  ABDOMEN;  His abdomen is soft, flat and nontender, no organomegaly noted. Bowel sounds are active.  No CVA tenderness.  MUSCULOSKELETAL:  No joint swelling or deformity.  His gait is normal.  He has good range of motion.  His muscle strength and tone is equal bilaterally.  SKIN:  Warm and very dry, especially his hands.  He has good turgor.  His nail beds are pink with good capillary refill.  NEUROLOGICAL:  He is oriented x 3. His cranial nerves are grossly intact. his deep tendon reflexes are 2+ equal and adequate.  Good grip strength bilaterally.  No involuntary movements.  His gait is normal.  His cerebellar function is intact.  His Romberg is negative. DD:  02/27/00 TD:  02/27/00 Job: [id number]

## 2010-10-11 NOTE — Discharge Summary (Signed)
[name], [name]               ACCOUNT NO.:  [id number]   MEDICAL RECORD NO.:  [id number]          PATIENT TYPE:  IPS   LOCATION:  0504                          FACILITY:  [facility]   PHYSICIAN:  Geoffery Lyons, M.D.      DATE OF BIRTH:  11-30-1960   DATE OF ADMISSION:  03/03/2008  DATE OF DISCHARGE:  03/10/2008                               DISCHARGE SUMMARY   CHIEF COMPLAINT/HISTORY OF PRESENT ILLNESS:  This was one of multiple  admissions to [facility] for this 50 year old male who  relapsed on cocaine and alcohol.  Endorsed suicidal ideas.  Went to the  emergency room.  Overdose on Ambien and alcohol, and cocaine.  Apparently missed out on a bet on worker having a new group home.  At  the time of this assessment, homeless.  Not compliant with medications  after discharge.   PAST MEDICAL HISTORY:  Multiple admissions to [facility], last on  September 12th to the 25th.  Had been at [facility], [facility], and [facility].   SOCIAL HISTORY:  As already stated, chronic relapse on alcohol and  cocaine.   MEDICAL HISTORY:  Noncontributory.   MEDICATIONS:  1. Lithium 1200 mg per day.  2. Ambien 10 at bedtime for sleep.  3. Symmetrel 100 twice a day.  4. Vistaril 25 every 4 hours as needed.   PHYSICAL EXAMINATION:  Failed to show any acute findings   LABORATORY WORK:  White blood cells 3.9, potassium 3.3, glucose 105.  UDS positive for cocaine.   MENTAL STATUS EXAM:  Reveals alert cooperative male, good eye contact.  Mood anxious, depressed.  Affect, depressed.  Thought processes,  logical, coherent and relevant.  Endorsed feeling ashamed for having to  be back, upset with the situation where he lost his placement.  Feeling  very overwhelmed, not knowing where to go from here.  Was denying any  active suicidal or homicidal ideas, no evidence of delusions,  hallucinations.  Cognition, well preserved.   ADMITTING DIAGNOSES:  AXIS I:   Bipolar disorder, cocaine, marijuana  abuse.  Rule out dependence.  AXIS II:  No diagnosis.  AXIS III:  No diagnosis.  AXIS IV:  Moderate.  AXIS V:  On admission 25-30, highest GAF in the last year 60.   COURSE IN THE HOSPITAL:  Was admitted, started individual and group  psychotherapy.  We maintained the lithium.  As already stated, he  endorsed that the placement did not really materialize and because of  this he chose to use drugs.  The medications were restarted.  He  endorsed he was feeling better.  October 12 was endorsing that he was  very ashamed to have to come back, but said he could not help the  relapse.  Was thinking about going to a rescue mission in [place].  Felt that he probably was going to end going to [place] and  [place].  No clear discharge plan.  October 13, he was wanting to go  into an assisted-living facility.  He continued to work on self.  We  went ahead and got the treatment with lithium optimized.  October 14, he  was getting more anxious, did not see much progress in terms of  placement.  Receptive to an assisted-living facility.  Some mood  lability.  Irritable with pressured speech, loud in the afternoon,  wanting to go to [facility].  Not sure he understood why he could not go to  [facility] despite the fact that he was given the rational explanation as to  why.  He was planning to do [place].  Endorsed he was wanting to  get out of the area.  Continued to have a hard time anticipating what  was going to happen once he got out.  By October 16, he was in full  contact with reality.  Mood euthymic.  Affect brighter.  Brother was  going to go in to [facility].  We made the connections  to see the providers at [facility] in [place].  Upon discharge in  full contact with reality.  No active suicidal ideas, no delusions,  hallucinations.  Back on medications, wanting to pursue long-term  abstinence.   DISCHARGE DIAGNOSES:  AXIS I:   Bipolar disorder, alcohol and cocaine  dependence.  AXIS II:  No diagnosis.  AXIS III:  No diagnosis.  AXIS IV:  Moderate.  AXIS V:  On discharge 55-60.   DISCHARGE MEDICATIONS:  1. Lithium CR 450 one in the morning 2 at night.  2. Symmetrel 100 mg twice a day.  3. Ambien 10 at bedtime for sleep.  4. Vistaril 25 every 4 hours as needed for anxiety.   Lithium level 0.38 on October 13.  Follow up at [facility] in [facility].      Geoffery Lyons, M.D.  Electronically Signed     IL/MEDQ  D:  04/04/2008  T:  04/04/2008  Job:  [id number]
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
## 2010-10-11 NOTE — H&P (Signed)
[facility]  Patient:    [name], [name]                      MRN: [id number] Adm. Date:  [id number] Disc. Date: [id number] Attending:  Jasmine Pang CC:         [facility].   Psychiatric Admission Assessment  DATE OF ADMISSION:  September 06, 2000  PATIENT IDENTIFICATION:  This is a 50 year old African-American male with schizoaffective disorder.  HISTORY OF PRESENT ILLNESS:  The patient states he had been drinking three six packs of beer daily as well as wine.  He has also been using cocaine every few days.  He has exhausted all resources from his family members and has been living on the street.  He has been noncompliant with any follow-up psychiatric treatment or medication.  He presents for detoxification given that he is unable to stop using alcohol without having withdrawal symptoms.  PAST PSYCHIATRIC HISTORY:  The patient is supposed to be seen at [facility].  He is supposed to be on Prozac and Geodon but he discontinued this.  SUBSTANCE ABUSE HISTORY:  As per history of present illness.  PAST MEDICAL HISTORY:  Recent weight loss.  He is on no medications.  Drugs allergies: No known drug allergies.  SOCIAL HISTORY:  Family history: Unknown.  The patient currently lives on the streets.  He has exhausted family support.  He has no known legal problems at this time.  He has not been working due to his psychiatric disability.  MENTAL STATUS EXAMINATION:  Pleasant, cooperative male with intermittent eye contact.  Psychomotor retardation.  Lying in bed during much of the exam. Speech was soft and slow.  Mood was depressed.  Affect: Consistent with mood. No suicidal or homicidal ideations, no psychosis or perceptual disturbance. Thought processes: Logical and goal-directed.  Thought content: No predominant theme.  Cognitive: Alert and oriented x 4.  Short-term and long-term memory were adequate.  General fund of  knowledge: Age and education level appropriate.  Attention and concentration: Diminished.  Insight: Minimal. Judgment: Poor.  ADMISSION DIAGNOSES: Axis I:    1. Schizoaffective disorder.            2. Alcohol dependence.            3. Cocaine dependence. Axis II:   Deferred. Axis III:  Healthy. Axis IV:   Severe. Axis V:    Global assessment of functioning current 30, highest past year 50.  ASSETS AND STRENGTHS:  The patient is pleasant.  He has a help-seeking attitude.  Problems: Noncompliance with treatment of schizoaffective disorder and alcohol dependence with need for detoxification.  INITIAL PLAN OF CARE:  Short-term treatment goal: Safely withdrawal from alcohol.  Long-term treatment goal: Will continue medications for his schizoaffective disorder and maintain an active sobriety program to prevent relapse.  Restart medications for schizoaffective disorder.  Begin phenobarbital protocol.  Begin unit therapeutic groups and activities with the focus on chemical dependence and developing a strong recovery program.  ESTIMATED LENGTH OF STAY:  Three to five days.  CONDITIONS NECESSARY FOR DISCHARGE:  Adequately detoxified from alcohol.  POST HOSPITAL CARE PLAN:  Will return to a shelter to live.  Follow-up therapy and medication management will be at the [facility]. DD:  09/22/00 TD:  09/23/00 Job: [id number]

## 2010-10-11 NOTE — Discharge Summary (Signed)
[name], [name]               ACCOUNT NO.:  [id number]   MEDICAL RECORD NO.:  [id number]          PATIENT TYPE:  IPS   LOCATION:  0507                          FACILITY:  [facility]   PHYSICIAN:  Geoffery Lyons, M.D.      DATE OF BIRTH:  1960-06-29   DATE OF ADMISSION:  08/07/2004  DATE OF DISCHARGE:  08/13/2004                                 DISCHARGE SUMMARY   CHIEF COMPLAINT AND PRESENTING ILLNESS:  This was one of multiple admissions  to [facility]  for this 50 year old African-American male,  single, voluntarily admitted.  Having thoughts of hurting himself by  overdosing according to grief, reporting depression for a year, sad, cries  often, lonely, helpless, hopeless, labile.  Poor appetite, insomnia,  anxiety, fear and auditory hallucinations telling him he is God,  grandiosity, delusional.  Off medications for 2 weeks.   PAST PSYCHIATRIC HISTORY:  Multiple inpatient stays, diagnosed  schizoaffective with polysubstance abuse, multiple admissions to also [facility] [place].  Followed by [facility].   ALCOHOL AND DRUG HISTORY:  Previous abuse of cocaine and alcohol, claims no  use in a year.   PAST MEDICAL HISTORY:  Denies history of any major medical condition.   MEDICATIONS:  Prozac 40 mg per day, Risperdal M-tab 1 mg at night, Vistaril  as needed, Lunesta 3 mg daily.   PHYSICAL EXAMINATION:  Performed, failed to show any acute findings except  right hand limited range of motion, index finger poorly healed fracture.   LABORATORY WORKUP:   MENTAL STATUS EXAM:  Reveals a well groomed, alert, cooperative male,  hyperthymic, grandiose, expansive.  Speech clear, even tone, some pressure.  Mood anxiety, affect anxiety, expansive.  Thought processes grandiose,  delusional, endorsed hallucinations.  Endorsed having suicidal ruminations,  could contract for safety, no homicidal ideas, no active hallucinations at  the  time of the evaluation.  Cognition well preserved.   ADMISSION DIAGNOSES:   AXIS I:  1.  Schizoaffective disorder.  2.  Polysubstance abuse in remission.   AXIS II:  No diagnosis.   AXIS III:  Poorly healed fracture of right hand.   AXIS IV:  Moderate.   AXIS V:  Global assessment of function upon admission 35, highest global  assessment of function in past year 65.   COURSE IN HOSPITAL:  He was admitted and started on intensive individual and  group psychotherapy.  Labs obtained while in the unit.  CBC was within  normal limits.  Blood chemistries were within normal limits.  Glucose 113.  Liver enzymes:  SGOT 39, SGPT 38.  TSH 0.955.  Drug screen negative for  substances of abuse.Marland Kitchen  He was placed back on Lunesta 2 mg at night, Prozac  10 mg daily, was given Risperdal 0.5 at night that was increased to  Risperdal M-tab 1 mg at night, given Vistaril 25 3 times a day.  Was given  Symmetrel 100 twice a day.  He endorsed that he got depressed, was feeling  like hurting himself.  Off the medication for 2 weeks,  an exacerbation of  the auditory hallucinations, claimed he did not relapse.  Was still having  modest support.  Endorsed he was dealing with some stressful events at home.  Initially had suicidal ruminations but could contract for safety.  Claimed  that he was in [place] protesting the war for 2 weeks, did not take  the medications.  Endorsed he started to go down hill.  Difficult time with  his mood, depressed, feeling his loneliness, got upset.  Endorsed that he  could have relapsed but he decided to get some help.  He was back on his  medications, started improving.  He was somewhat anxious, anticipating  being discharged and dealing with what he was facing out of the hospital.  But he was able to work on coping skills, stress management.  He was back on  his medications, overall he was better.  On March 21, he was in full contact  with reality.  No suicidal ideas,  no homicidal ideas, no hallucinations, no  delusions.  Willing to abstain.  Wanting to pursue individual therapy and  continue his medications.   DISCHARGE DIAGNOSES:   AXIS I:  1.  Schizoaffective disorder.  2.  Polysubstance abuse in remission.   AXIS II:  No diagnosis.   AXIS III:  Fracture of right hand.   AXIS IV:  Moderate.   AXIS V:  Global assessment of function upon discharge 55-60.   DISCHARGE MEDICATIONS:  1.  Prozac 10 mg per day.  2.  Vistaril 25 3 times a day.  3.  Risperdal 1 mg at night.  4.  Symmetrel 100 twice a day.  5.  Ambien CR 12.5 at bedtime.   DISPOSITION:  Follow up with Dr. Lang Snow and [facility].      IL/MEDQ  D:  09/04/2004  T:  09/04/2004  Job:  [id number]
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
## 2010-10-11 NOTE — Discharge Summary (Signed)
[facility]  Patient:    [name], [name]                      MRN: [id number] Adm. Date:  [id number] Disc. Date: [id number] Attending:  Jasmine Pang CC:         Harolyn Rutherford, [facility]   Discharge Summary  REASON FOR ADMISSION:  Patient was a 50 year old African-American male with schizoaffective disorder.  He had been admitted after a period of heavy drinking of alcohol.  He states he drinks three six-packs of beer a day plus wine.  He also admitted to using some cocaine daily.  He had been living on the streets and was noncompliant with his follow-up psychiatric treatment or medications for his schizoaffective disorder. He was suppose to be on Prozac and Geodon.  He did not follow up at the [facility] as had been arranged.  PHYSICAL EXAMINATION:  This was done by [facility]  The patient was noted to be healthy.  ADMISSION LABORATORIES:  Hemogram was within normal limits.  Routine chemistry panel was within normal limits. TSH and T4 were within normal limits.  HOSPITAL COURSE:  Upon admission, the patient was begun on the phenobarbital detox protocol.  He also was begun on Prozac 20 mg q.a.m.  On September 10, 2000, he was begun on Geodon 20 mg p.o. b.i.d. x 2 doses, then 40 mg p.o. b.i.d.  He tolerated the withdrawal protocol without problems.  There were no withdrawal symptoms noted or reported.  He tolerated his psychiatric medications without significant side effects except for some sedation on the Geodon.  He participated minimally in the unit therapeutic groups and activities, spending much of his time in bed.  He was given a list of homes to call for possible living situation and further rehab.  He showed some interest in calling these but also seemed to expect the staff to do this for him.  DISCHARGE MENTAL STATUS EXAMINATION:  Mental status was improved.  The patient had  better eye contact. There was less psychomotor retardation.  Speech was still soft and slow.  Mood was less depressed.  Affect consistent with mood. There was no homicidal or suicidal ideation. There was no self injurious behavior or aggression.  There was no psychosis or perceptual disturbance. Thought processes were logical and goal directed.  The cognitive exam was back to baseline. Insight minimal.  Judgement poor.  DISCHARGE DIAGNOSES: AXIS I.   1. Schizoaffective disorder.           2. Depressed.           3. Alcohol dependence.           4. Cocaine dependence. AXIS II.  None. AXIS III. Healthy. AXIS IV.  Severe. AXIS V.   Global Assessment of Functioning current is 30, highest in the           past year of 50, discharge is 40.  DISCHARGE MEDICATIONS: 1. Prozac 20 mg q.a.m. 2. Geodon 40 mg p.o. b.i.d.  ACTIVITY LEVEL:  No restrictions.  DIET:  No restrictions.  POST HOSPITAL CARE PLANS:  The patient is still arranging for stay at a longer term rehabilitation facility.  He has been scheduled for follow-up at the [facility].  He has a reentry appointment on September 22, 2000, at 1 p.m. with Harolyn Rutherford. DD:  09/22/00 TD:  09/23/00 Job: [id number]
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
## 2010-10-11 NOTE — Discharge Summary (Signed)
[facility]  Patient:    [name], [name]                      MRN: [id number] Adm. Date:  [id number] Disc. Date: [id number] Attending:  Annamarie Dawley Dictator:   Landry Corporal, NP                           Discharge Summary  DATE OF ADMISSION:  February 01, 2000  HISTORY OF PRESENT ILLNESS:  This is a 50 year old, single, black male [facility] with history of increasing depression, hallucinations, and suicidal ideation.  He has been reporting a one-month history of worsening depression with auditory hallucinations, relapse in alcohol and cocaine one week prior to admission.  He has also been noncompliant with his medication.  Decreased sleep, decreased appetite, nausea, vomiting, and decreased energy.  He was having suicidal thoughts and planned to jump out in front of a car.  He reports that he had been drinking two six packs a day and using about $100 worth of cocaine in the past week. Patient has a history of multiple admissions and was recently at [facility] two months prior and had been switched from Geodon to Haldol.  He was also on Prozac but was taken off his medications for the past several weeks.  PAST MEDICAL HISTORY:  He does not have a primary care Lylian Sanagustin and has no significant medical problems.  ADMISSION MEDICATIONS: 1. Haldol 3 mg q.h.s. 2. Prozac.  ALLERGIES:  No known drug allergies.  PHYSICAL EXAMINATION:  Not noted to be on the chart.  MENTAL STATUS EXAMINATION:  Casually dressed, tall, black male.  Speech normal.  Thought processes show auditory hallucinations.  He had suicidal ideation but denies any homicidal ideation.  Mood is depressed.  Affect is sad and blunted.  He is oriented x 3.  His cognitive function is intact.  ADMISSION DIAGNOSES: Axis I:    1. Schizoaffective disorder.            2. Polysubstance abuse. Axis II:   No diagnosis. Axis III:  No diagnosis. Axis IV:    Psychosocial stressors are severe. Axis V:    Global Assessment of Functioning: Current is 40, highest this past            year is 55.  LABORATORY DATA:  Blood alcohol level was 82.  INITIAL PLAN OF CARE:  Patient was admitted to [facility] for treatment of his schizoaffective disorder and polysubstance abuse.  He was started on his normal medication.  Patient initially was somewhat sedated but reporting a decrease in his auditory hallucinations.  He was beginning to sleep better, his appetite was fair, and his suicidal ideation was decreasing some.  Over the course of his hospitalization, he was still reporting some suicidal thoughts and hearing some voices and Paxil was changed to q.h.s. and Geodon was increased.  On the day of discharge, patient denied any suicidal or homicidal ideation.  There was no evidence of psychosis although he was occasionally seeing spots.  He was denying any auditory hallucinations.  His mood was improved.  His sleep was improving as well as his appetite.  He was in agreement with his discharge plans to mental health and was discharged that day.  POST HOSPITAL CARE PLAN:  Patient was to follow up with [facility]  FOLLOW-UP MEDICATIONS: 1. Geodon 20 mg q.a.m.  and 40 mg q.h.s. 2. Paxil 20 mg p.o. q.h.s. 3. Vistaril 25 mg p.o. b.i.d. as needed for anxiety.  FINAL DIAGNOSES: Axis I:    1. Schizoaffective disorder.            2. Polysubstance abuse. Axis II:   No diagnosis. Axis III:  No diagnosis. Axis IV:   Psychosocial stressors severe. Axis V:    Global Assessment of Functioning: Current 55, highest this past            year 55. DD:  02/19/00 TD:  02/19/00 Job: [id number]

## 2010-10-11 NOTE — H&P (Signed)
[facility]  Patient:    [name], [name]                        MRN: [id number] Adm. Date:  02/27/00 Attending:  Netta Cedars, M.D. Dictator:   Candi Leash. Theressa Stamps, N.P.                   Psychiatric Admission Assessment  DATE OF ADMISSION:  February 27, 2000  PATIENT IDENTIFICATION:  This is a 50 year old single black male admitted on February 27, 2000 voluntarily to [facility] for schizoaffective disorder, suicidal ideation.  HISTORY OF PRESENT ILLNESS:  The patient reports feeling very depressed with suicidal thoughts for the past two weeks.  He states his grandmother was recently placed in a nursing home and he felt that he should die before she did.  He also has been having some auditory hallucinations that have been telling him that he does not belong here, he belongs in another world.  He admits to visual hallucinations, seeing demons and spirits especially at night.  He has also been having some feelings of paranoia.  He reports his appetite has much decreased with some weight loss unspecified.  PAST PSYCHIATRIC HISTORY:  He has multiple admissions.  His last admission was to [facility] about a month ago on February 01, 2000 with other hospitalizations at [facility] x 2, one at the age of 68 and one approximately a year ago.  SUBSTANCE ABUSE HISTORY:  He states he has about one beer per week and denies any street drug use because he has no money.  PAST MEDICAL HISTORY:  Primary care Beatryce Colombo: He has none.  Medical problems: Hypertension but he takes no medication for that.  He is on Geodon 20 mg q.a.m. and 40 mg q.h.s.  He says he has been compliant.  He is also on Paxil 20 mg q.h.s. and says he has been compliant with that medication as well. Drug allergies: None.  SOCIAL HISTORY:  He is a 50 year old black single male.  He has no children. He lives alone, although his living arrangements are somewhat  questionable. He seemed to indicate that he may on the streets without having an actual place of residence.  He says he works sometimes.  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  He finished the 11th grade.  He smokes about two cigarettes a day and started this past year.  Family history: No psychiatric illness that he is aware of.  PHYSICAL EXAMINATION:  Pending.  Last blood pressure was stable at 112/71.  MENTAL STATUS EXAMINATION:  A 50 year old tall, black male dressed casually. He awoke easily to come for his interview.  He was cooperative.  He had very little eye contact, most of the time having his face in his hands.  Speech was slow with normal volume.  Mood was depressed.  Affect is depressed and flat. Thought processes: He answers questions but admits to auditory or visual hallucinations, positive suicidal ideation, negative homicidal ideation, negative delusions, negative flight of ideas, positive paranoia.  Cognitive: Oriented to his name and month but was uncertain about the day of the week and date of the month.  Judgment is poor.  Insight is poor.  Recent memory is poor.  ADMISSION DIAGNOSES: Axis I:    1. Schizoaffective disorder.            2. Past history of polysubstance abuse. Axis II:   Deferred. Axis III:  Hypertension.  Axis IV:   Severe with problems related to primary support group, lack of;            occupational problems, economic problems, and other psychosocial            problems. Axis V:    Current 30.  INITIAL PLAN OF CARE:  Voluntary admission to [facility] for depression, suicidal ideation, and psychotic symptoms.  Contract for safety, check every 15 minutes.  The patient is in agreement for contractual aspects of safety.  We will start him on is Geodon 20 mg q.a.m. and 40 mg q.h.s. with his Paxil 20 mg q.h.s.  We will obtain urine drug screen and alcohol level.  ESTIMATED LENGTH OF STAY:  Three to five days.DD:  02/27/00 TD:   02/27/00 Job: [id number]

## 2010-11-29 ENCOUNTER — Inpatient Hospital Stay
Admit: 2010-11-29 | Discharge: 2010-12-05 | Disposition: A | Discharge status: Home / Self Care | Payer: MEDICAID | Attending: Psychiatry | Admitting: Psychiatry

## 2010-11-29 DIAGNOSIS — F3189 Other bipolar disorder: Secondary | ICD-10-CM

## 2010-11-29 LAB — POC CHEM8
Anion gap (POC): 14 mmol/L (ref 5–15)
BUN (POC): 18 MG/DL (ref 9–20)
CO2 (POC): 26 MMOL/L (ref 21–32)
Calcium, ionized (POC): 1.19 MMOL/L (ref 1.12–1.32)
Chloride (POC): 107 MMOL/L (ref 98–107)
Creatinine (POC): 0.9 MG/DL (ref 0.6–1.3)
GFRAA, POC: >60 mL/min/{1.73_m2} (ref >60)
GFRNA, POC: >60 mL/min/{1.73_m2} (ref >60)
Glucose (POC): 93 MG/DL (ref 75–110)
Hematocrit (POC): 43 % (ref 36.6–50.3)
Hemoglobin (POC): 14.6 GM/DL (ref 12.1–17.0)
Potassium (POC): 3.7 MMOL/L (ref 3.5–5.1)
Sodium (POC): 142 MMOL/L (ref 137–145)

## 2010-11-29 LAB — DRUG SCREEN, URINE
AMPHETAMINES: NEGATIVE
BARBITURATES: NEGATIVE
BENZODIAZEPINES: NEGATIVE
COCAINE: POSITIVE — AB
METHADONE: NEGATIVE
OPIATES: NEGATIVE
PCP(PHENCYCLIDINE): NEGATIVE
THC (TH-CANNABINOL): NEGATIVE

## 2010-11-29 LAB — LITHIUM
Lithium level: <0.2 MMOL/L — ABNORMAL LOW (ref 0.6–1.2)
Reported dose date: 20120706
Reported dose time:: 1916
Reported dose:: 0 UNITS

## 2010-11-29 LAB — ETHYL ALCOHOL: ALCOHOL(ETHYL),SERUM: <10 MG/DL (ref <10)

## 2010-11-29 NOTE — ED Notes (Signed)
Received report from off-going report. Assumed care of pt at this time. Pt calm and cooperative and has been given cup of water.

## 2010-11-29 NOTE — ED Notes (Signed)
Just broke up with his girlfriend

## 2010-11-29 NOTE — ED Notes (Signed)
Meal tray served to pt

## 2010-11-29 NOTE — Behavioral Health Treatment Team (Signed)
Pt arrived on unit accompanied by security . Patient expressed suicidal ideation (step in front of car). He became depressed when girlfriend decided to break up with him. He recently came to [place] to live with her from [place] . He presented to unit depressed with sad affect. He contracts for safety at this point. Denies hearing voices. He is positive for cocaine.

## 2010-11-29 NOTE — ED Notes (Signed)
[facility] counselor in with pt.

## 2010-11-29 NOTE — Progress Notes (Signed)
TRANSFER - OUT REPORT:    Verbal report given to Lelon Perla, RN on [name] [name]  being transferred to [facility] for routine progression of care       Report consisted of patient???s Situation, Background, Assessment and   Recommendations(SBAR).     Information from the following report(s) SBAR, ED Summary and Recent Results was reviewed with the receiving nurse.    Opportunity for questions and clarification was provided.

## 2010-11-29 NOTE — ED Notes (Signed)
Pt presents to ED by EMS with c/o panic attack, anxiety and suicidal ideation.  Pt place in room.  Pt placed in gown, belongings removed to secure location.  Will cont to monitor.

## 2010-11-29 NOTE — ED Notes (Signed)
Case discussed with deborah(bsmart)

## 2010-11-29 NOTE — ED Provider Notes (Signed)
Patient is a 50 y.o. male presenting with panic. The history is provided by the patient and the EMS personnel.   Panic Attack   This is a recurrent problem. The current episode started more than 2 days ago. The problem has been gradually worsening. The pain is associated with stress (no lithium for a month).        No past medical history on file.     No past surgical history on file.      No family history on file.     History     Social History   ??? Marital Status: N/A     Spouse Name: N/A     Number of Children: N/A   ??? Years of Education: N/A     Occupational History   ??? Not on file.     Social History Main Topics   ??? Smoking status: Not on file   ??? Smokeless tobacco: Not on file   ??? Alcohol Use: Not on file   ??? Drug Use: Not on file   ??? Sexually Active: Not on file     Other Topics Concern   ??? Not on file     Social History Narrative   ??? No narrative on file                  ALLERGIES: Review of patient's allergies indicates not on file.      Review of Systems   All other systems reviewed and are negative.        Filed Vitals:    11/29/10 1745   BP: 120/76   Pulse: 76   Temp: 97.8 ??F (36.6 ??C)   Resp: 18   Height: 6\' 1"  (1.854 m)   Weight: 99.791 kg (220 lb)   SpO2: 98%            Physical Exam   Nursing note and vitals reviewed.  Constitutional: He is oriented to person, place, and time. He appears well-developed and well-nourished.   HENT:   Head: Normocephalic and atraumatic.   Mouth/Throat: Oropharynx is clear and moist. No oropharyngeal exudate.   Eyes: Conjunctivae and EOM are normal. Pupils are equal, round, and reactive to light. Right eye exhibits no discharge. Left eye exhibits no discharge. No scleral icterus.   Neck: Normal range of motion. Neck supple. No tracheal deviation present. No thyromegaly present.   Cardiovascular: Normal rate, regular rhythm, normal heart sounds and intact distal pulses.    No murmur heard.   Pulmonary/Chest: Effort normal and breath sounds normal. No respiratory distress. He has no wheezes. He has no rales.   Abdominal: Soft. He exhibits no distension. There is no tenderness. There is no rebound and no guarding.   Musculoskeletal: Normal range of motion. He exhibits no edema and no tenderness.   Lymphadenopathy:     He has no cervical adenopathy.   Neurological: He is alert and oriented to person, place, and time. No cranial nerve deficit. Coordination normal.   Skin: Skin is warm. No rash noted. No erythema.   Psychiatric:        Sl. Agitated, a and o times 3, suicidal ideation, wants to be admitted        MDM    Procedures
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
## 2010-11-29 NOTE — ED Notes (Signed)
BSR to [employer]

## 2010-11-30 LAB — LIPID PANEL
CHOL/HDL Ratio: 2.8 (ref 0–5.0)
Cholesterol, total: 133 MG/DL (ref <200)
HDL Cholesterol: 48 MG/DL
LDL, calculated: 61.2 MG/DL (ref 0–100)
Triglyceride: 119 MG/DL (ref <150)
VLDL, calculated: 23.8 MG/DL

## 2010-11-30 LAB — METABOLIC PANEL, COMPREHENSIVE
A-G Ratio: 1.1 (ref 1.1–2.2)
ALT (SGPT): 36 U/L (ref 12–78)
AST (SGOT): 20 U/L (ref 15–37)
Albumin: 3.2 g/dL — ABNORMAL LOW (ref 3.5–5.0)
Alk. phosphatase: 105 U/L (ref 50–136)
Anion gap: 7 mmol/L (ref 5–15)
BUN/Creatinine ratio: 17 (ref 12–20)
BUN: 17 MG/DL (ref 6–20)
Bilirubin, total: 0.3 MG/DL (ref 0.2–1.0)
CO2: 26 MMOL/L (ref 21–32)
Calcium: 8.5 MG/DL (ref 8.5–10.1)
Chloride: 108 MMOL/L (ref 97–108)
Creatinine: 1 MG/DL (ref 0.6–1.3)
GFR est AA: >60 mL/min/{1.73_m2} (ref >60)
GFR est non-AA: >60 mL/min/{1.73_m2} (ref >60)
Globulin: 3 g/dL (ref 2.0–4.0)
Glucose: 109 MG/DL — ABNORMAL HIGH (ref 65–100)
Potassium: 3.6 MMOL/L (ref 3.5–5.1)
Protein, total: 6.2 g/dL — ABNORMAL LOW (ref 6.4–8.2)
Sodium: 141 MMOL/L (ref 136–145)

## 2010-11-30 LAB — GLUCOSE, FASTING: Glucose: 109 MG/DL — ABNORMAL HIGH (ref 65–100)

## 2010-11-30 LAB — CBC W/O DIFF
HCT: 41.3 % (ref 36.6–50.3)
HGB: 13.4 g/dL (ref 12.1–17.0)
MCH: 28.8 PG (ref 26.0–34.0)
MCHC: 32.4 g/dL (ref 30.0–36.5)
MCV: 88.8 FL (ref 80.0–99.0)
PLATELET: 172 10*3/uL (ref 150–400)
RBC: 4.65 M/uL (ref 4.10–5.70)
RDW: 12.3 % (ref 11.5–14.5)
WBC: 4.3 10*3/uL (ref 4.1–11.1)

## 2010-11-30 LAB — TSH 3RD GENERATION: TSH: 1.23 u[IU]/mL (ref 0.36–3.74)

## 2010-11-30 MED ORDER — ZOLPIDEM 5 MG TAB
5 mg | Freq: Every evening | ORAL | Status: DC | PRN
Start: 2010-11-30 — End: 2010-12-05
  Administered 2010-12-04 – 2010-12-05 (×2): via ORAL

## 2010-11-30 MED ORDER — OLANZAPINE 10 MG TAB
10 mg | Freq: Four times a day (QID) | ORAL | Status: DC | PRN
Start: 2010-11-30 — End: 2010-12-05

## 2010-11-30 MED ORDER — BENZTROPINE 2 MG TAB
2 mg | Freq: Two times a day (BID) | ORAL | Status: DC | PRN
Start: 2010-11-30 — End: 2010-12-05

## 2010-11-30 MED ORDER — MAGNESIUM HYDROXIDE 400 MG/5 ML ORAL SUSP
400 mg/5 mL | Freq: Every day | ORAL | Status: DC | PRN
Start: 2010-11-30 — End: 2010-12-05

## 2010-11-30 MED ORDER — ACETAMINOPHEN 325 MG TABLET
325 mg | ORAL | Status: DC | PRN
Start: 2010-11-30 — End: 2010-12-05

## 2010-11-30 MED ORDER — IBUPROFEN 400 MG TAB
400 mg | Freq: Four times a day (QID) | ORAL | Status: DC | PRN
Start: 2010-11-30 — End: 2010-12-05

## 2010-11-30 NOTE — Behavioral Health Treatment Team (Cosign Needed)
Patient did not attend Relaxation Group.  PAULETTE J ALLEN  11/30/2010  11:17 PM

## 2010-11-30 NOTE — Behavioral Health Treatment Team (Signed)
Patient affect flat and sad.Minimal interaction with staff and peers.Conversation with patient very limited and minimal information.Appears guarded and suspicious.Contracts for safety.Encouraged to interact with staff and peers.Encouraged to attend unit activities.Will continue to monitor pt and behavior.

## 2010-11-30 NOTE — Behavioral Health Treatment Team (Cosign Needed Addendum)
Pt has been meal and medication compliant. Continues to have a flat, sad affect.  Pt has isolated to his room, very limited interaction with peers but does come to staff to have needs met.  No problems or concerns voiced at this time by pt.  Pt contracts for safety.  Q 15 minute checks for safety.  Continue to monitor.

## 2010-11-30 NOTE — Behavioral Health Treatment Team (Cosign Needed)
REFLECTIONS GROUP THERAPY PROGRESS NOTE    The patient [name] [name] is participating in Reflections Group Therapy.     Group time: 30 minutes    Personal goal for participation: Share with group about feelings and concerns throughout the course of the day.    Goal orientation: personal    Group therapy participation: active    Therapeutic interventions reviewed and discussed: Yes    Impression of participation:   Positive input.    PAULETTE J ALLEN  11/30/2010

## 2010-11-30 NOTE — Behavioral Health Treatment Team (Signed)
Social Work Psychosocial Assessment  The pt is a 50 year old male here on TDO due to suicidal ideation, pt wanted to jump in front of a car after his girlfriend broke up him. Pt moved from [place] a month ago to be with his girlfriend. Pt reported that he has been off his medications for about a month. Pt is diagnosed with Bipolar Disorder. Pt reported that he uses cocaine and drinks when he gets depressed. Pt was receiving mental health treatment in [place], but he has never been to treatment for his substance abuse.    Pt is single with no children. Pt was staying in a hotel with is girlfriend. Pt is currently homeless, [place] he has burned all his bridges with his family. Pt would like to stay in [place], possible group home placement. Pt went as far as the 7th grade in school. Pt is unable to read or write well. Pt support comes from his mother [name] [name] [phone].     Pt was alert and oriented. Pt still feeling sucidial. Pt anxious about his living situation and being in a new state. Pt had some thought blocking. Pt was cooperative and pleasant. The plan will be to assist pt was transferring his SSI to [place] and look for available group home placements.

## 2010-11-30 NOTE — Behavioral Health Treatment Team (Cosign Needed)
Pt dd not participate in Goal Group.

## 2010-11-30 NOTE — Behavioral Health Treatment Team (Signed)
Pt rested quietly in bed with eyes closed for approximately 7 hours.  No signs/symptoms of distress, agitation, or anxiety.  Will monitor for changes.  Q 15 minute checks continue.

## 2010-11-30 NOTE — Behavioral Health Treatment Team (Signed)
CBC, CMP, TSH, Lipid Panel, and Fasting glucose drawn and sent to the lab for processing.
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
## 2010-11-30 NOTE — Behavioral Health Treatment Team (Signed)
I find that the 11p-7a assessment is consistent with the LPN evaluation and agree with the care provided

## 2010-12-01 MED ADMIN — lithium SR (LITHOBID) tablet 600 mg: ORAL | @ 21:00:00 | NDC 00054002125

## 2010-12-01 MED ADMIN — risperiDONE (RISPERDAL) tablet 3 mg: ORAL | @ 21:00:00 | NDC 68084027411

## 2010-12-01 MED ADMIN — clonazePAM (KLONOPIN) tablet 1 mg: ORAL | @ 21:00:00 | NDC 63739026310

## 2010-12-01 MED ADMIN — lithium SR (LITHOBID) tablet 600 mg: ORAL | @ 14:00:00 | NDC 00054002125

## 2010-12-01 MED ADMIN — risperiDONE (RISPERDAL) tablet 3 mg: ORAL | @ 16:00:00 | NDC 68084027411

## 2010-12-01 MED ADMIN — risperiDONE (RISPERDAL) tablet 3 mg: ORAL | @ 01:00:00 | NDC 68084027411

## 2010-12-01 MED ADMIN — zolpidem (AMBIEN) tablet 10 mg: ORAL | @ 01:00:00 | NDC 68084022511

## 2010-12-01 MED ADMIN — clonazePAM (KLONOPIN) tablet 1 mg: ORAL | @ 16:00:00 | NDC 63739026310

## 2010-12-01 MED ADMIN — lithium SR (LITHOBID) tablet 600 mg: ORAL | @ 01:00:00 | NDC 00054002125

## 2010-12-01 MED ADMIN — clonazePAM (KLONOPIN) tablet 1 mg: ORAL | @ 14:00:00 | NDC 63739026310

## 2010-12-01 NOTE — Behavioral Health Treatment Team (Cosign Needed)
GROUP THERAPY PROGRESS NOTE    [name] [name]    Group time: 30 minutes    Personal goal for participation: daily/program orientation    Goal orientation: community    Group therapy participation: active    Therapeutic interventions reviewed and discussed: yes    Impression of participation: good

## 2010-12-01 NOTE — Behavioral Health Treatment Team (Signed)
Pt rested quietly in bed with eyes closed for approximately 7 hours.  No signs/symptoms of distress, agitation, or anxiety.  Will monitor for changes.  Q 15 minute checks continue.

## 2010-12-01 NOTE — Behavioral Health Treatment Team (Signed)
Psychiatric Progress Note    Date: 12/01/2010  Account Number:  [id number]  Name: [name] [name]    Length of psychotherapy session (Supportive/reality-oriented) : 20 minutes. Psychotherapy provided in regards to pre-admission and current problems. Psychoeducation provided. Treatment plan reviewed with patient, including diagnosis and medications.    Total Patient Care Time Spent: 30 minutes : (Coordinated treatment team rounds, discussions with nurses, social worker, pharmacist, recreation therapist, case manager, counseling time with patient, and discussion with family members). Chart reviewed in full.    SUBJECTIVE:   Patient reports no new problems or issues since last psychiatric contact.  Appetite:good   Sleep good      Side Effects:  None reported or admitted to.    OBJECTIVE:         Mental Status exam: WNL except for    Sensorium  oriented to time, place and person   Relations cooperative   Appearance:  overweight   Motor Behavior:  hypoactive   Speech:  hypoverbal   Thought Process: flight of ideas and loose associations   Thought Content delusions   Suicidal ideations none   Homicidal ideations none   Mood:  euphoric and labile   Affect:  depressed   Memory recent  adequate   Memory remote:  adequate   Concentration:  adequate   Abstraction:  concrete   Insight:  poor   Reliability poor   Judgment:  poor     Pertinent data:  Patient Vitals for the past 8 hrs:   BP Temp Pulse Resp   12/01/10 1253 105/64 mmHg 98.3 ??F (36.8 ??C) 73  18      No results found for this or any previous visit (from the past 24 hour(s)).    Medications:  Current Facility-Administered Medications   Medication Dose Route Frequency   ??? risperiDONE (RISPERDAL) tablet 3 mg  3 mg Oral NOW   ??? risperiDONE (RISPERDAL) tablet 3 mg  3 mg Oral NOW   ??? risperiDONE (RISPERDAL) tablet 3 mg  3 mg Oral BID   ??? lithium SR (LITHOBID) tablet 600 mg  600 mg Oral BID   ??? clonazePAM (KLONOPIN) tablet 1 mg  1 mg Oral TID    ??? zolpidem (AMBIEN) tablet 10 mg  10 mg Oral QHS   ??? ziprasidone (GEODON) 20 mg in sterile water (preservative free) injection  20 mg IntraMUSCular Q6H PRN   ??? OLANZapine (ZYPREXA) tablet 10 mg  10 mg Oral Q6H PRN   ??? benztropine (COGENTIN) tablet 2 mg  2 mg Oral BID PRN   ??? benztropine (COGENTIN) injection 2 mg  2 mg IntraMUSCular Q6H PRN   ??? LORazepam (ATIVAN) injection 2 mg  2 mg IntraMUSCular Q4H PRN   ??? LORazepam (ATIVAN) tablet 2 mg  2 mg Oral Q4H PRN   ??? zolpidem (AMBIEN) tablet 10 mg  10 mg Oral QHS PRN   ??? acetaminophen (TYLENOL) tablet 650 mg  650 mg Oral Q4H PRN   ??? ibuprofen (MOTRIN) tablet 800 mg  800 mg Oral Q6H PRN   ??? magnesium hydroxide (MILK OF MAGNESIA) oral suspension 30 mL  30 mL Oral DAILY PRN   ??? nicotine (NICODERM CQ) 14 mg/24 hr patch 1 Patch  1 Patch TransDERmal DAILY PRN       Scheduled Medications:  Current Facility-Administered Medications   Medication Dose Route Frequency   ??? risperiDONE (RISPERDAL) tablet 3 mg  3 mg Oral NOW   ??? risperiDONE (RISPERDAL) tablet 3 mg  3 mg Oral  NOW   ??? risperiDONE (RISPERDAL) tablet 3 mg  3 mg Oral BID   ??? lithium SR (LITHOBID) tablet 600 mg  600 mg Oral BID   ??? clonazePAM (KLONOPIN) tablet 1 mg  1 mg Oral TID   ??? zolpidem (AMBIEN) tablet 10 mg  10 mg Oral QHS       Medication Changes: see above  Will continue to titrate/adjust medications as tolerated and to response for symptom management.    The following was addressed during rounds:  patient given opportunity to ask questions      ASSESSMENT/PLAN:     Diagnoses:  Principal Problem:   *Bipolar disorder, current episode mixed, severe, with psychotic features (11/30/2010)        Target Symptoms Affecting Safety/ADL's/Roles/Relationships:    Improving Resolved Same Worse N/A   Depression []                        []                        [x]                        []                        []                           Mania/Hypomania []                        []                        []                        []                        [x]                          Anxiety []                        []                        []                        []                        [x]                          Psychosis []                        []                        [x]                        []                        []   Agitation/Agression []                        []                        [x]                        []                        []                          Insomnia []                        []                        []                        []                        [x]                            Patient's overall response to treatment:  []                        Positive: mild / moderate / significant   []                        Negative   [x]                        No Change   []                         Close to Baseline     Reason for continued hospitalization:  [x]                         Further Stabilization Needed   [x]                         Unsafe to Self/Others   [x]                         Not at Baseline   [x]                         Needs Intensive Inpatient Treatment Still   [x]                         At high risk of relapse if discharged at this time       Expected Discharge Date:     Status/Treatment Plan:   No significant change in condition thus far, remains psychotic /depresed  Will continue with the above medication management, individual psychotherapy and  milieu, recreational, group and substance abuse (if appropriate) therapies to address target symptoms.    Signed By: Michaelene Song, MD

## 2010-12-01 NOTE — Behavioral Health Treatment Team (Signed)
Patient affect is flat,limited interaction with peers,patient is guarded,sit's alone when he is in the dayroom.Writer talked with patient about events leading up to his admission,client states he broke up with his girlfriend and became suicidal. At the present time patient denies suicidal ideation,when Clinical research associate was talking with patient he had some thought blocking and slow to process.Client states when he get's depressed he starts to drink and use cocaine.Encourage patient to go to AA meeting and COA as a added support for him after discharge.Client states he was in treatment in [place] when he was there.At the present time patient is cooperative,behavior is appropriate,no acting out behavior.Staff will monitor patient for changes in her behavior and condition.

## 2010-12-01 NOTE — Behavioral Health Treatment Team (Signed)
Patient attended Nursing Education Group.

## 2010-12-01 NOTE — Behavioral Health Treatment Team (Signed)
Pt has been med and meal complaint.Attending unit activities with little participation.Isolative and withdrawn.Minimal interaction with staff and peers.Encouraged to interact with staff and peers.Affect flat and sad.Will continue to monitor pt and behavior.

## 2010-12-01 NOTE — Behavioral Health Treatment Team (Cosign Needed)
Pt did not participate in group.

## 2010-12-01 NOTE — Behavioral Health Treatment Team (Signed)
GROUP THERAPY PROGRESS NOTE    [name] [name] is participating in Nursing Group.    Group time: 30 minutes    Personal goal for participation: Active involvement in discussion    Goal orientation: Personal    Group therapy participation: minimal and passive    Patient's behavior during group:is guarded    Therapeutic interventions reviewed and discussed: yes    Impression of participation:  poor

## 2010-12-01 NOTE — Other (Signed)
GROUP THERAPY PROGRESS NOTE    [name] [name] is participating in Process Group.     Group time: 20 minutes    Personal goal for participation: none given    Goal orientation: personal    Group therapy participation: active    Therapeutic interventions reviewed and discussed: Discussed paradoxical nature of giving kindness to receive it.    Impression of participation: Attentive

## 2010-12-02 MED ADMIN — clonazePAM (KLONOPIN) tablet 1 mg: ORAL | @ 21:00:00 | NDC 63739026310

## 2010-12-02 MED ADMIN — LORazepam (ATIVAN) tablet 2 mg: ORAL | @ 22:00:00 | NDC 68084008911

## 2010-12-02 MED ADMIN — lithium SR (LITHOBID) tablet 600 mg: ORAL | @ 13:00:00 | NDC 00054002125

## 2010-12-02 MED ADMIN — carBAMazepine XR (TEGRETOL XR) tablet 200 mg: ORAL | @ 21:00:00 | NDC 68084056111

## 2010-12-02 MED ADMIN — clonazePAM (KLONOPIN) tablet 1 mg: ORAL | @ 13:00:00 | NDC 63739026310

## 2010-12-02 MED ADMIN — lithium SR (LITHOBID) tablet 600 mg: ORAL | @ 21:00:00 | NDC 00054002125

## 2010-12-02 MED ADMIN — risperiDONE (RISPERDAL) tablet 3 mg: ORAL | @ 13:00:00 | NDC 68084027411

## 2010-12-02 MED ADMIN — clonazePAM (KLONOPIN) tablet 1 mg: ORAL | @ 17:00:00 | NDC 63739026310

## 2010-12-02 MED ADMIN — risperiDONE (RISPERDAL) tablet 4 mg: ORAL | @ 21:00:00 | NDC 68084027411

## 2010-12-02 MED ADMIN — zolpidem (AMBIEN) tablet 10 mg: ORAL | @ 02:00:00 | NDC 68084022511

## 2010-12-02 NOTE — Behavioral Health Treatment Team (Signed)
Patient became after agitated after talking to Dr. Jomarie Longs. He interpreted the conversation as he would be discharged in three days. He stated that if he is discharged in three days he will find his girlfriend and kill him. I attempted to clarify the meaning, stating that he would not automatically be released in three if the medication is not showing signs of working. Patient wanted me to inform the doctor of his concern. He went back to his room as directed. Dr. Jomarie Longs did talk to the patient and clarified his intent, letting him know that he will probably be here five to seven days at the least. Patient was satisfied with the explanation.

## 2010-12-02 NOTE — Consults (Signed)
Medical Consult for [facility] Patient    Consult H&P   dictated, see patient chart [phone]    Impression:    [name] [name] a 50 y.o. male with past medical history of arthritis and mental health dz. presents with behavioral health problems of suicidal ideation admitted for further psychiatric evaluation and treatment.    Plan:   1. Psychiatry to manage mental health issues  2. Medically stable at this time, will follow up as needed.    Thank you  Fay Records, MD  12/02/2010, 2:07 PM

## 2010-12-02 NOTE — Behavioral Health Treatment Team (Cosign Needed)
Pt attend reflection group.

## 2010-12-02 NOTE — Behavioral Health Treatment Team (Cosign Needed)
Pt did not attend coping skills group.

## 2010-12-02 NOTE — Behavioral Health Treatment Team (Signed)
Pt has been observed throughout the night sleeping in bed with even respirations. Total hours slept approximately  7 - 8. No s/s of distress noted. Will continue to monitor.

## 2010-12-02 NOTE — Behavioral Health Treatment Team (Signed)
While speaking with pt, he stated that he moved here from [place] to have a relationship with his girlfriend. Once she found out the he was bi-polar she asked him to leave now he is without a place to live.  He is unable to return home, to [facility] and is homeless here.  Pt said that he plans to get a gun from a friend and kill his girlfriend and himself.  He does not want to hurt anyone else just the two of them.  Staff informed him that he should not do that and that she would have to notify the nurse regarding this.  Staff informed the Charge Nurse of this.

## 2010-12-02 NOTE — Behavioral Health Treatment Team (Cosign Needed)
Pt didn't attend goals group.

## 2010-12-02 NOTE — Behavioral Health Treatment Team (Signed)
Pt has been med and meal complaint.Attending unit activities with little participation.Isolative and withdrawn.Minimal interaction with staff and peers.Encouraged to interact with staff and peers.Affect flat and sad.Will continue to monitor pt and behavior.

## 2010-12-02 NOTE — Behavioral Health Treatment Team (Cosign Needed)
GROUP THERAPY PROGRESS NOTE    The patient [name] [name] is participating in Community Group.     Group time: 30 minutes    Personal goal for participation: daily orientation    Goal orientation: personal    Group therapy participation: minimal    Therapeutic interventions reviewed and discussed: Yes    Impression of participation: good    [name]  12/02/2010  11:44 AM

## 2010-12-02 NOTE — Consults (Signed)
Name:       AQEEL, NORGAARD             Admitted:          11/29/2010                                         DOB:               1960/07/28  Account #:  [id number]               Age:               50  Consultant: Sherilyn Cooter, MD       Location           [phone]                                CONSULTATION REPORT    DATE OF CONSULTATION      REFERRING PHYSICIAN: Sultan A. Lelon Huh, MD    REASON FOR CONSULTATION: Medical evaluation for psych admission.    CHIEF COMPLAINT: Anxiety with suicidal ideations.    HISTORY OF PRESENT ILLNESS: A 50 year old male presented very anxious and  suicidal, requiring further mental health evaluation and treatment. He says  he has a little joint pain, but otherwise feels physically fine. Denies any  chest pain, shortness of breath, nausea, vomiting, diarrhea, change in  bowel or bladder habits. The patient does not have a primary care  physician.    PAST MEDICAL HISTORY: Arthritis.    PAST SURGICAL HISTORY: None.    ALLERGIES: NONE.    MEDICATIONS: None.    SOCIAL HISTORY: Denies any tobacco use, occasionally drinks alcohol. States  he uses occasional marijuana but denies heroin and cocaine use. Single, no  kids. Currently gets disability.    PHYSICAL EXAMINATION  VITAL SIGNS: Temperature 97.8, blood pressure 120/76, pulse 76,  respirations 18. Height 6 foot 1, weight 220 pounds, pulse oximetry 99%.  GENERAL: Pleasant, in no acute distress.  HEAD, EYES, EARS, NOSE, AND THROAT: Oropharynx is clear.  NECK: Supple.  LUNGS: Clear.  CARDIOVASCULAR: Regular rate, no murmurs, gallops, or rubs.  ABDOMEN: Soft, nontender, nondistended. Normoactive bowel sounds. No  hepatosplenomegaly.  EXTREMITIES: No cyanosis, clubbing, or edema.    LABORATORY DATA: Sodium is 142, potassium 3.7, chloride 107, bicarbonate is  28, BUN is 18, creatinine 0.9, glucose 93. Hemoglobin is 14.6, hematocrit  is 43. Lithium level less than 0.2. Tox screen is positive for cocaine.   Alcohol level less than 2. TSH is 1.23.    IMPRESSION: A 50 year old male with past medical history of arthritis, who  presents with suicidal ideation, and polysubstance abuse, admitted for  further psychiatric evaluation and treatment.    PLAN:  1. Psychiatry management of mental health issues.  2. Psychiatry to manage any withdrawal issues.  3. Medically stable. Will followup on a p.r.n. basis.    Thank you for this consult.              Sherilyn Cooter, MD    cc:   Sherilyn Cooter, MD      DC/wmx; D: 12/02/2010  2:10 P; T: 12/02/2010 11:26 A; DOC# [id number]; Job#  [id number]

## 2010-12-02 NOTE — Behavioral Health Treatment Team (Cosign Needed)
Pt did not participate  In group activities.

## 2010-12-02 NOTE — Behavioral Health Treatment Team (Signed)
Psychiatric Progress Note    Date: 12/02/2010  Account Number:  [id number]  Name: [name] [name]    Length of psychotherapy session (Supportive/reality-oriented) : 25 minutes. Psychotherapy provided in regards to pre-admission and current problems. Psychoeducation provided. Treatment plan reviewed with patient, including diagnosis and medications.    Total Patient Care Time Spent: 45 minutes : (Coordinated treatment team rounds, discussions with nurses, social worker, pharmacist, recreation therapist, case manager, counseling time with patient, and discussion with family members). Chart reviewed in full.    SUBJECTIVE:   Patient reports no new problems or issues since last psychiatric contact.  Appetite:good   Sleep good      Side Effects:  None reported or admitted to.    OBJECTIVE:         Mental Status exam: WNL except for    Sensorium  disorganized, oriented to time, place and person   Relations guarded, passive and unreliable   Appearance:  disheveled and malodorous   Motor Behavior:  hyperactive and restless   Speech:  hyperverbal   Thought Process: flight of ideas and disorganized   Thought Content delusions, hallucinations and obsessions   Suicidal ideations plan and intention   Homicidal ideations plan, intention and death wishes   Mood:  anxious and labile   Affect:  inappropriate and labile   Memory recent  adequate   Memory remote:  adequate   Concentration:  adequate   Abstraction:  concrete   Insight:  poor   Reliability poor   Judgment:  poor     Pertinent data:  Patient Vitals for the past 8 hrs:   Resp   12/02/10 0715 16      No results found for this or any previous visit (from the past 24 hour(s)).    Medications:  Current Facility-Administered Medications   Medication Dose Route Frequency   ??? risperiDONE (RISPERDAL) tablet 6 mg  6 mg Oral QHS   ??? risperiDONE (RISPERDAL) tablet 4 mg  4 mg Oral NOW   ??? asenapine (SAPHRIS) SubLINGual tablet 5 mg  5 mg SubLINGual BID    ??? lithium SR (LITHOBID) tablet 600 mg  600 mg Oral BID   ??? clonazePAM (KLONOPIN) tablet 1 mg  1 mg Oral TID   ??? zolpidem (AMBIEN) tablet 10 mg  10 mg Oral QHS   ??? ziprasidone (GEODON) 20 mg in sterile water (preservative free) injection  20 mg IntraMUSCular Q6H PRN   ??? OLANZapine (ZYPREXA) tablet 10 mg  10 mg Oral Q6H PRN   ??? benztropine (COGENTIN) tablet 2 mg  2 mg Oral BID PRN   ??? benztropine (COGENTIN) injection 2 mg  2 mg IntraMUSCular Q6H PRN   ??? LORazepam (ATIVAN) injection 2 mg  2 mg IntraMUSCular Q4H PRN   ??? LORazepam (ATIVAN) tablet 2 mg  2 mg Oral Q4H PRN   ??? zolpidem (AMBIEN) tablet 10 mg  10 mg Oral QHS PRN   ??? acetaminophen (TYLENOL) tablet 650 mg  650 mg Oral Q4H PRN   ??? ibuprofen (MOTRIN) tablet 800 mg  800 mg Oral Q6H PRN   ??? magnesium hydroxide (MILK OF MAGNESIA) oral suspension 30 mL  30 mL Oral DAILY PRN   ??? nicotine (NICODERM CQ) 14 mg/24 hr patch 1 Patch  1 Patch TransDERmal DAILY PRN       Scheduled Medications:  Current Facility-Administered Medications   Medication Dose Route Frequency   ??? risperiDONE (RISPERDAL) tablet 6 mg  6 mg Oral QHS   ??? risperiDONE (  RISPERDAL) tablet 4 mg  4 mg Oral NOW   ??? asenapine (SAPHRIS) SubLINGual tablet 5 mg  5 mg SubLINGual BID   ??? lithium SR (LITHOBID) tablet 600 mg  600 mg Oral BID   ??? clonazePAM (KLONOPIN) tablet 1 mg  1 mg Oral TID   ??? zolpidem (AMBIEN) tablet 10 mg  10 mg Oral QHS       Medication Changes: see above  Will continue to titrate/adjust medications as tolerated and to response for symptom management.    The following was addressed during rounds:  patient given opportunity to ask questions      ASSESSMENT/PLAN:     Diagnoses:  Principal Problem:   *Bipolar disorder, current episode mixed, severe, with psychotic features (11/30/2010)        Target Symptoms Affecting Safety/ADL's/Roles/Relationships:    Improving Resolved Same Worse N/A    Depression []                        []                        [x]                        []                        []                          Mania/Hypomania []                        []                        [x]                        []                        []                          Anxiety []                        []                        []                        []                        [x]                          Psychosis []                        []                        [x]                        []                        []   Agitation/Agression []                        []                        [x]                        []                        []                          Insomnia []                        []                        []                        []                        [x]                            Patient's overall response to treatment:  []                        Positive: mild / moderate / significant   []                        Negative   [x]                        No Change   []                         Close to Baseline     Reason for continued hospitalization:  [x]                         Further Stabilization Needed   [x]                         Unsafe to Self/Others   [x]                         Not at Baseline   [x]                         Needs Intensive Inpatient Treatment Still   [x]                         At high risk of relapse if discharged at this time       Expected Discharge Date:     Status/Treatment Plan:   No significant change in condition thus far, remains psychotic [name]  Will continue with the above medication management, individual psychotherapy and  milieu, recreational, group and substance abuse (if appropriate) therapies to address target symptoms.    Signed By: Michaelene Song, MD

## 2010-12-03 MED ADMIN — LORazepam (ATIVAN) tablet 2 mg: ORAL | @ 03:00:00 | NDC 68084008911

## 2010-12-03 MED ADMIN — LORazepam (ATIVAN) tablet 2 mg: ORAL | @ 01:00:00 | NDC 68084008911

## 2010-12-03 MED ADMIN — clonazePAM (KLONOPIN) tablet 1 mg: ORAL | @ 21:00:00 | NDC 63739026310

## 2010-12-03 MED ADMIN — lithium SR (LITHOBID) tablet 600 mg: ORAL | @ 21:00:00 | NDC 00054002125

## 2010-12-03 MED ADMIN — clonazePAM (KLONOPIN) tablet 1 mg: ORAL | @ 16:00:00 | NDC 63739026310

## 2010-12-03 MED ADMIN — lithium SR (LITHOBID) tablet 600 mg: ORAL | @ 12:00:00 | NDC 00054002125

## 2010-12-03 MED ADMIN — clonazePAM (KLONOPIN) tablet 1 mg: ORAL | @ 12:00:00 | NDC 63739026310

## 2010-12-03 NOTE — Behavioral Health Treatment Team (Signed)
Pt refused his Tegretol and Saphris this am. Will alert the MD and charge nurse.

## 2010-12-03 NOTE — Behavioral Health Treatment Team (Cosign Needed)
GROUP THERAPY PROGRESS NOTE    [name] [name]    Group time: 30 minutes    Personal goal for participation: daily/program orientation    Goal orientation: community    Group therapy participation: active    Therapeutic interventions reviewed and discussed: yes    Impression of participation: good

## 2010-12-03 NOTE — Progress Notes (Signed)
Problem: Psychosis  Goal: *STG/LTG: Complies with medication therapy  Outcome: Not Progressing Towards Goal  Pt has been refusing his medications.

## 2010-12-03 NOTE — Behavioral Health Treatment Team (Signed)
GROUP THERAPY PROGRESS NOTE    [name] [name] is participating in nursing education group.     Group time: 30 minutes    Personal goal for participation: medication teaching    Goal orientation: personal    Group therapy participation: minimal    Therapeutic interventions reviewed and discussed: yes    Impression of participation: pt is not receptive to teaching.

## 2010-12-03 NOTE — Behavioral Health Treatment Team (Signed)
Pt rested during the night with eyes closed. Will continue to monitor.

## 2010-12-03 NOTE — Behavioral Health Treatment Team (Cosign Needed)
GROUP THERAPY PROGRESS NOTE    [name] [name] is not participating in Goals Group.     Group time: 30 minutes    Personal goal for participation: Today's goals    Goal orientation: personal    Group therapy participation: did not attend    Therapeutic interventions reviewed and discussed: Yes    Impression of participation: did not attend

## 2010-12-03 NOTE — Behavioral Health Treatment Team (Cosign Needed)
GROUP THERAPY PROGRESS NOTE    The patient Navraj Dreibelbis a 50 y.o. male is participating in Coping Skills Group.     Group time: 45 minutes    Personal goal for participation: To participate in relaxation activity    Goal orientation:  relaxation    Group therapy participation: minimal    Therapeutic interventions reviewed and discussed: yes    Impression of participation:  The patient was present-elected to watch    BEVERLY S BAKER  12/04/2010  10:47 AM

## 2010-12-03 NOTE — Behavioral Health Treatment Team (Signed)
Patient continues to be paranoid. Admits to still hearing voices that are directing him to kill his ex-girlfriend. Patient refused part of his medications even though the importance of compliance was explained to him. Eating meals in dayroom. Attending group, but participation is minimal. Patient encouraged to seek out staff support when agitated. q15 min. Monitoring continued for safety.

## 2010-12-03 NOTE — Behavioral Health Treatment Team (Signed)
GROUP THERAPY PROGRESS NOTE    [name] [name] is participating in reflections.     Group time: 30 minutes    Personal goal for participation: to reflect on daily goal    Goal orientation: personal    Group therapy participation: active    Therapeutic interventions reviewed and discussed: yes    Impression of participation: good

## 2010-12-04 NOTE — Progress Notes (Signed)
Problem: Psychosis  Goal: *STG: Decreased hallucinations  Outcome: Progressing Towards Goal  Patient verbalized that he has no delusions at the present time.  Goal: *STG: Decreased delusional thinking  Outcome: Progressing Towards Goal  Patient verbalized he is not having any delusions today

## 2010-12-04 NOTE — Behavioral Health Treatment Team (Signed)
Patient requested that writer give his case manager the name and phone number of a group home that he would like to be admitted to. The information was passed on.

## 2010-12-04 NOTE — Behavioral Health Treatment Team (Cosign Needed)
Pt attend goals group.

## 2010-12-04 NOTE — Behavioral Health Treatment Team (Signed)
Psychiatric Progress Note    Date: 12/04/2010  Account Number:  [id number]  Name: [name] [name]    Length of psychotherapy session (Supportive/reality-oriented) : 15 minutes. Psychotherapy provided in regards to pre-admission and current problems. Psychoeducation provided. Treatment plan reviewed with patient, including diagnosis and medications.    Total Patient Care Time Spent: 15 minutes : (Coordinated treatment team rounds, discussions with nurses, social worker, pharmacist, recreation therapist, case manager, counseling time with patient, and discussion with family members). Chart reviewed in full.    SUBJECTIVE:   Patient reports no new problems or issues since last psychiatric contact.  Appetite:good   Sleep good      Side Effects:  None reported or admitted to.    OBJECTIVE:         Mental Status exam: WNL except for    Sensorium  oriented to time, place and person   Relations uncooperative and unreliable   Appearance:  age appropriate   Motor Behavior:  within normal limits   Speech:  hyperverbal   Thought Process: within normal limits   Thought Content delusions   Suicidal ideations none   Homicidal ideations none   Mood:  irritable and labile   Affect:  hostile   Memory recent  adequate   Memory remote:  adequate   Concentration:  adequate   Abstraction:  concrete   Insight:  poor   Reliability poor   Judgment:  poor     Pertinent data:  No data found.    No results found for this or any previous visit (from the past 24 hour(s)).    Medications:  Current Facility-Administered Medications   Medication Dose Route Frequency   ??? risperiDONE (RISPERDAL) tablet 6 mg  6 mg Oral QHS   ??? lithium SR (LITHOBID) tablet 600 mg  600 mg Oral BID   ??? clonazePAM (KLONOPIN) tablet 1 mg  1 mg Oral TID   ??? ziprasidone (GEODON) 20 mg in sterile water (preservative free) injection  20 mg IntraMUSCular Q6H PRN   ??? OLANZapine (ZYPREXA) tablet 10 mg  10 mg Oral Q6H PRN   ??? benztropine (COGENTIN) tablet 2 mg  2 mg Oral BID PRN    ??? benztropine (COGENTIN) injection 2 mg  2 mg IntraMUSCular Q6H PRN   ??? LORazepam (ATIVAN) injection 2 mg  2 mg IntraMUSCular Q4H PRN   ??? LORazepam (ATIVAN) tablet 2 mg  2 mg Oral Q4H PRN   ??? zolpidem (AMBIEN) tablet 10 mg  10 mg Oral QHS PRN   ??? acetaminophen (TYLENOL) tablet 650 mg  650 mg Oral Q4H PRN   ??? ibuprofen (MOTRIN) tablet 800 mg  800 mg Oral Q6H PRN   ??? magnesium hydroxide (MILK OF MAGNESIA) oral suspension 30 mL  30 mL Oral DAILY PRN   ??? nicotine (NICODERM CQ) 14 mg/24 hr patch 1 Patch  1 Patch TransDERmal DAILY PRN       Scheduled Medications:  Current Facility-Administered Medications   Medication Dose Route Frequency   ??? risperiDONE (RISPERDAL) tablet 6 mg  6 mg Oral QHS   ??? lithium SR (LITHOBID) tablet 600 mg  600 mg Oral BID   ??? clonazePAM (KLONOPIN) tablet 1 mg  1 mg Oral TID       Medication Changes: see above  Will continue to titrate/adjust medications as tolerated and to response for symptom management.    The following was addressed during rounds:  patient given opportunity to ask questions      ASSESSMENT/PLAN:  Diagnoses:  Principal Problem:   *Bipolar disorder, current episode mixed, severe, with psychotic features (11/30/2010)        Target Symptoms Affecting Safety/ADL's/Roles/Relationships:    Improving Resolved Same Worse N/A   Depression []                        []                        []                        []                        [x]                          Mania/Hypomania [x]                        []                        []                        []                        []                          Anxiety []                        []                        []                        []                        [x]                          Psychosis [x]                        []                        []                        []                        []                           Agitation/Agression [x]                        []                        []                        []                        []   Insomnia []                        []                        []                        []                        [x]                            Patient's overall response to treatment:  [x]                        Positive: mild    []                        Negative   []                        No Change   []                         Close to Baseline     Reason for continued hospitalization:  [x]                         Further Stabilization Needed   [x]                         Unsafe to Self/Others   [x]                         Not at Baseline   [x]                         Needs Intensive Inpatient Treatment Still   [x]                         At high risk of relapse if discharged at this time       Expected Discharge Date:     Status/Treatment Plan: .  Continues to show gradual improvement in overall condition.  Will continue with the above medication management, individual psychotherapy and  milieu, recreational, group and substance abuse (if appropriate) therapies to address target symptoms.    Signed By: Michaelene Song, MD

## 2010-12-04 NOTE — Progress Notes (Signed)
Problem: Psychosis  Goal: *STG/LTG: Complies with medication therapy  Outcome: Not Progressing Towards Goal  Non-compliant with taking medication. Is refusing to take medication unless CM call a selected group home and works on getting him admitted there. Patient stated that he was told last night that he will be discharged Friday. Continue to encourage patient to take medication to assist him to process information more effectively.

## 2010-12-04 NOTE — Progress Notes (Signed)
Recommendations; cont diet as tolerated.  Pt screens at low risk at this time.  BMI of 29.1 c/w overweight.  Rd following per protocol.

## 2010-12-04 NOTE — Behavioral Health Treatment Team (Signed)
Pt rested during the night with eyes closed. Will continue to monitor.

## 2010-12-04 NOTE — Behavioral Health Treatment Team (Signed)
Pt is very upset with what was discussed during treatment team with his doctor yesterday. He was very offended by certain statements. He feels like the doctor is not listening to him. He states that he was taking Lithium and Vistaril prior to coming in and those are the medications he wants to stay on. He therefore doesn't want to take the other medications and states that he wants to speak with his case manager first. Information was passed on.

## 2010-12-04 NOTE — Progress Notes (Signed)
Problem: Psychosis  Goal: *STG: Seeks staff when feelings of self harm or harm towards others arise  Outcome: Progressing Towards Goal  Patient seeks out staff for assistance. Continue to encourage patient to discuss problems/concerns with staff.

## 2010-12-04 NOTE — Progress Notes (Signed)
Problem: Psychosis  Goal: *STG: Decreased delusional thinking  Outcome: Not Progressing Towards Goal  Patient continues to be paranoid. Still saying that if he is discharge he is going to do what he has to. Admits to still having SI/HI towards himself and girlfriend. Discussed interventions patient can use when discharged such as calling crisis. Patient was not receptive to this. Appears distressed about his imminent discharge.

## 2010-12-04 NOTE — Behavioral Health Treatment Team (Cosign Needed)
GROUP THERAPY PROGRESS NOTE    The patient [name] [name] is participating in Community Group.     Group time: 30 minutes    Personal goal for participation: none    Goal orientation: personal    Group therapy participation: did not attend    Therapeutic interventions reviewed and discussed: no    Impression of participation: none    [name]  12/04/2010  8:52 AM

## 2010-12-04 NOTE — Behavioral Health Treatment Team (Signed)
GROUP THERAPY PROGRESS NOTE    The patient [name] [name] a 50 y.o. male is participating in Reflections Group    Group time: 30 minutes    Personal goal for participation: To discuss the daily goals    Goal orientation: personal    Group therapy participation: active    Therapeutic interventions reviewed and discussed:  Yes    Impression of participation: good  Darrel Hoover  12/04/2010  10:17 PM

## 2010-12-04 NOTE — Behavioral Health Treatment Team (Signed)
Patient visible on the unit. Patient affect pleasant. Patient was active in the dayroom, socializing and interacting with staff and peers. Patient denies having suicidal and/or homicidal thoughts. Patient has not voiced any complaints or concerns. Patient has been med and meal compliant and has not displayed any behavioral issues. During interaction with staff patient made no comment on status of current hospitalization. Will continue to monitor Q15 minutes for safety

## 2010-12-04 NOTE — Progress Notes (Signed)
Problem: Psychosis  Goal: *STG/LTG: Demonstrates improved social functioning by responding appropriately to staff  Outcome: Progressing Towards Goal  Patient isolates to self in dayroom. Participates in selective group sessions with minimum interaction. Continues to be paranoid. Continue one-to-one communication with patient.

## 2010-12-04 NOTE — Behavioral Health Treatment Team (Signed)
GROUP THERAPY PROGRESS NOTE    [name] [name] is participating in Nursing Education Group.     Group time: 30 minutes    Personal goal for participation: Education    Goal orientation: medication education    Group therapy participation: minimal    Therapeutic interventions reviewed and discussed: yes    Impression of participation: fair

## 2010-12-05 MED ORDER — CLONAZEPAM 1 MG TAB
1 mg | ORAL_TABLET | Freq: Three times a day (TID) | ORAL | Status: DC
Start: 2010-12-05 — End: 2017-04-16

## 2010-12-05 MED ORDER — LITHIUM CARBONATE SR 300 MG TAB
300 mg | ORAL_TABLET | Freq: Two times a day (BID) | ORAL | Status: AC
Start: 2010-12-05 — End: 2011-01-04

## 2010-12-05 MED ADMIN — clonazePAM (KLONOPIN) tablet 1 mg: ORAL | @ 12:00:00 | NDC 63739026310

## 2010-12-05 MED ADMIN — clonazePAM (KLONOPIN) tablet 1 mg: ORAL | @ 15:00:00 | NDC 63739026310

## 2010-12-05 MED ADMIN — lithium SR (LITHOBID) tablet 600 mg: ORAL | @ 12:00:00 | NDC 00054002125

## 2010-12-05 NOTE — Progress Notes (Signed)
Problem: Depressed Mood (Adult)  Goal: *STG: Attends activities and groups  Outcome: Progressing Towards Goal  Pt has been present at groups with some participation.

## 2010-12-05 NOTE — ED Provider Notes (Signed)
HPI Comments: 50 y/o AA male presents to the ED for mental health evaluation.  According to patient, he presents to the ED via EMS for increased anxiety and suicidal ideation. According to patient, his girlfriend just left him and he has threatened to shoot her with a gun.  He denies wanting to hurt himself.  He was recently admitted at [facility] and signed out AMA because he felt they were not helping him.  He has not been taking his medications.  Patient denies any acute medical complaints at this time.      The history is provided by the patient.        Past Medical History   Diagnosis Date   ??? Bipolar affective    ??? PTSD (post-traumatic stress disorder)         No past surgical history on file.      No family history on file.     History     Social History   ??? Marital Status: Single     Spouse Name: N/A     Number of Children: N/A   ??? Years of Education: N/A     Occupational History   ??? Not on file.     Social History Main Topics   ??? Smoking status: Not on file   ??? Smokeless tobacco: Not on file   ??? Alcohol Use:    ??? Drug Use:    ??? Sexually Active:      Other Topics Concern   ??? Not on file     Social History Narrative   ??? No narrative on file                  ALLERGIES: No known allergies      Review of Systems   Constitutional: Negative for fever, chills and fatigue.   HENT: Negative for hearing loss, ear pain, sore throat, facial swelling, trouble swallowing, neck pain and neck stiffness.    Eyes: Negative.    Respiratory: Negative for cough, chest tightness, shortness of breath and wheezing.    Cardiovascular: Negative for chest pain.   Gastrointestinal: Negative for nausea, vomiting, abdominal pain, diarrhea and constipation.   Genitourinary: Negative for dysuria, urgency, frequency, hematuria and flank pain.   Musculoskeletal: Negative.    Skin: Negative.    Neurological: Negative for dizziness, weakness and headaches.   Hematological: Negative.     Psychiatric/Behavioral: Positive for suicidal ideas. The patient is nervous/anxious.    All other systems reviewed and are negative.        Filed Vitals:    12/05/10 1608   BP: 130/79   Pulse: 92   Temp: 97.8 ??F (36.6 ??C)   Resp: 16   Height: 6\' 1"  (1.854 m)   Weight: 99.791 kg (220 lb)   SpO2: 96%            Physical Exam   Nursing note and vitals reviewed.  Constitutional: He is oriented to person, place, and time. He appears well-developed and well-nourished. No distress.   HENT:   Head: Normocephalic and atraumatic.   Eyes: Conjunctivae and EOM are normal. Pupils are equal, round, and reactive to light.   Neck: Normal range of motion. Neck supple.   Cardiovascular: Normal rate, regular rhythm, normal heart sounds and intact distal pulses.    No murmur heard.  Pulmonary/Chest: Effort normal and breath sounds normal. No respiratory distress.   Abdominal: Soft. Bowel sounds are normal. He exhibits no distension. There is no tenderness.  Musculoskeletal: Normal range of motion. He exhibits no edema and no tenderness.   Neurological: He is alert and oriented to person, place, and time. Coordination normal.   Skin: Skin is warm and dry. No rash noted. No erythema.   Psychiatric:        Avoids eye contact  Flat affect        MDM    Procedures    Discussed patient including complaint, history, physical exam, test results and diagnosis and plan including treatment with attending physician.  Attending agrees with care and plan.  Discussed with Dr. Lynnell Jude, PA-C    [facility] evaluated patient.  They spoke with on call psychiatrist who wants the patient d/c home with follow up   Marylee Floras, PA-C     Spoke with Dr. Mayford Knife about not feeling comfortable d/c patient home given his threat to shoot his ex-girlfriend.  Dr. Mayford Knife evaluated patient.  Dr. Mayford Knife spoke with NP Viviana Simpler (name may be mispelled), who saw the patient in the ED as well and they were asked to d/c patient home.  They are unable to d/c patient home but per their request, Dr. Mayford Knife agrees to d/c patient  Marylee Floras, PA-C

## 2010-12-05 NOTE — Behavioral Health Treatment Team (Signed)
Social Work   Discharge noted for today.  The patient is in agreement with the plan and states readiness.  The patient will accept a referral to the shelter ([facility]).  [employer] will provide transportation.    Follow up  [facility]   [address]  [place]  [phone]  Walk In  Monday -Friday  9am

## 2010-12-05 NOTE — Behavioral Health Treatment Team (Signed)
Patient has been educated on home discharge instructions. Patient Verbalizes understanding and denies S/H ideation. All questions have been answered and all personal belongings and property has been returned to the patient.

## 2010-12-05 NOTE — Behavioral Health Treatment Team (Cosign Needed)
Pt attend goals group.

## 2010-12-05 NOTE — Behavioral Health Treatment Team (Signed)
Pt rested during the night with eyes closed. Will continue to monitor.

## 2010-12-05 NOTE — ED Notes (Signed)
The pt was recently admitted for the same homocidal suicidal complaint- he was admitted and seen today at [facility].. The psychiatry staff from here upstairs will see this pt.  [facility] saw him today at [facility]

## 2010-12-05 NOTE — Behavioral Health Treatment Team (Signed)
GROUP THERAPY PROGRESS NOTE    [name] [name] is participating in Process Group.     Group time: 20 minutes    Personal goal for participation: Medication compliance    Goal orientation: personal    Group therapy participation: passive    Therapeutic interventions reviewed and discussed: yes    Impression of participation: poor; patient came in during the last five minutes

## 2010-12-05 NOTE — Behavioral Health Treatment Team (Signed)
Patient received all valuables and has been threatening making a statement that he was gonna go to another hospital. Patient refused the cab that was called and stated that he would be taking the bus and checking into another facility. Because he was not happy with services here. patient was escorted down to the bus stop and given bus Ticket
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
## 2010-12-05 NOTE — Behavioral Health Treatment Team (Signed)
Social Work  Mr Mcbryar refused to accept shelter placement and will seek housing on his own.  [employer] transportation cancelled.
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
## 2010-12-05 NOTE — Progress Notes (Signed)
Pt ambulatory on discharge. Has spoken with [facility].

## 2010-12-16 NOTE — ED Provider Notes (Signed)
I personally saw and examined the patient.  I have reviewed and agree with the MLP's findings, including all diagnostic interpretations, and plans as written.   I was present during the key portions of separately billed procedures.    Slayde Brault C Edmar Blankenburg, MD

## 2010-12-17 NOTE — Discharge Summary (Signed)
Name:       [name] [name], [name] [name]                   Admitted:    11/29/2010                                               Discharged:  12/05/2010  Account #:  [id number]                     DOB:         [birth year]  Consultant: Michaelene Song, M.D.              Age          50                                 DISCHARGE SUMMARY      DISCHARGE DIAGNOSES  AXIS I:  1. Bipolar disorder, mixed, with psychotic features.  2. Posttraumatic stress disorder.  3. Malingering.  AXIS II: None.  AXIS III: Arthritis in knees.  AXIS IV: Severe.  AXIS V: At the time of admission 30, at the time of discharge 70.    HISTORY OF PRESENT ILLNESS:  Please see the initial psychiatric evaluation  in the chart by the writer dated 11/29/10 for full details, as well as for  description of mental status examination at the time of presentation.    The patient came to the hospital under warranty status reporting suicidal  ideations and panic attacks. The patient has been off his medications for a  month and had a breakup with the girlfriend. The patient also reported that  he was hearing voices telling him to cut his throat. The patient also  appeared to be manufacturing symptoms to be admitted because he is  homeless.    HOSPITALIZATION COURSE:  The patient was admitted to the inpatient  psychiatric unit for an acute exacerbation of bipolar disorder. The patient  was started on his previous psychotropic medications including Risperdal,  lithium, and Klonopin. However, the patient was not taking his medications  for about 1 month prior to present admission. The patient continued to be  manipulative and entitled during the hospital stay. Any discussion about  discharge plan the patient became very angry and verbally abusive. The  patient was stabilized on lithium 600 mg b.i.d. with adequate blood levels.  At the time of discharge the patient was having no suicidal or homicidal  ideations. The patient denied any auditory or visual hallucinations. The   patient continued to be manipulative and entitled, and also threatened to  commit suicide if he is discharged.    DISCHARGE MEDICATIONS  1. Risperdal 6 mg at bedtime.  2. Lithobid 600 mg twice daily.  3. Klonopin 1 mg 3 times daily.    DISPOSITION:  The patient is discharged to a local shelter.    PROGNOSIS:  Fair if the patient continues to take medications.              Michaelene Song, M.D.    cc:   Michaelene Song, M.D.      AJ/wmx; D: 12/16/2010 12:38 P; T: 12/17/2010  7:13 A; DOC# [id number]; Job#  [id number]

## 2011-02-17 LAB — COMPREHENSIVE METABOLIC PANEL
ALT: 29
Alkaline Phosphatase: 70
BUN: 19
CO2: 28
Glucose, Bld: 139 — ABNORMAL HIGH
Potassium: 3 — ABNORMAL LOW
Sodium: 139
Total Bilirubin: 1.4 — ABNORMAL HIGH

## 2011-02-17 LAB — DIFFERENTIAL
Eosinophils Relative: 7 — ABNORMAL HIGH
Lymphocytes Relative: 23
Lymphs Abs: 1.3
Monocytes Absolute: 0.5
Monocytes Relative: 10

## 2011-02-17 LAB — CBC
HCT: 43.8
Hemoglobin: 14.6
MCHC: 33.4
RBC: 4.86
RDW: 13.2

## 2011-02-17 LAB — RAPID URINE DRUG SCREEN, HOSP PERFORMED
Amphetamines: NOT DETECTED
Benzodiazepines: NOT DETECTED
Cocaine: POSITIVE — AB
Opiates: NOT DETECTED
Tetrahydrocannabinol: NOT DETECTED

## 2011-02-17 LAB — ETHANOL: Alcohol, Ethyl (B): <5

## 2011-02-17 LAB — URINALYSIS, ROUTINE W REFLEX MICROSCOPIC
Ketones, ur: >80 — AB
Nitrite: NEGATIVE
Protein, ur: 100 — AB
pH: 6

## 2011-02-17 LAB — URINE MICROSCOPIC-ADD ON

## 2011-02-18 LAB — RAPID URINE DRUG SCREEN, HOSP PERFORMED
Amphetamines: NOT DETECTED
Benzodiazepines: NOT DETECTED
Tetrahydrocannabinol: NOT DETECTED

## 2011-02-18 LAB — ETHANOL: Alcohol, Ethyl (B): <5

## 2011-02-18 LAB — LITHIUM LEVEL
Lithium Lvl: <0.25 — ABNORMAL LOW
Lithium Lvl: <0.25 — ABNORMAL LOW

## 2011-02-18 LAB — CBC
Platelets: 215
RDW: 12.3
WBC: 5.8

## 2011-02-18 LAB — BASIC METABOLIC PANEL
BUN: 9
Creatinine, Ser: 0.97
GFR calc non Af Amer: >60
Glucose, Bld: 87

## 2011-02-18 LAB — DIFFERENTIAL
Basophils Absolute: 0
Eosinophils Absolute: 0.4
Lymphocytes Relative: 20
Lymphs Abs: 1.2
Neutrophils Relative %: 70

## 2011-02-18 LAB — TRICYCLICS SCREEN, URINE: TCA Scrn: NOT DETECTED

## 2011-02-19 LAB — COMPREHENSIVE METABOLIC PANEL
AST: 46 — ABNORMAL HIGH
Albumin: 4.3
BUN: 15
Calcium: 8.8
Chloride: 107
Creatinine, Ser: 1.11
GFR calc Af Amer: >60
Total Bilirubin: 1
Total Protein: 7.1

## 2011-02-19 LAB — CBC
HCT: 42.9
MCV: 88
Platelets: 167
RDW: 12.8
WBC: 7.1

## 2011-02-19 LAB — ETHANOL: Alcohol, Ethyl (B): 107 — ABNORMAL HIGH

## 2011-02-19 LAB — RAPID URINE DRUG SCREEN, HOSP PERFORMED
Barbiturates: NOT DETECTED
Benzodiazepines: NOT DETECTED
Cocaine: POSITIVE — AB
Opiates: NOT DETECTED

## 2011-02-21 LAB — COMPREHENSIVE METABOLIC PANEL
AST: 22
Albumin: 3.6
Alkaline Phosphatase: 75
Chloride: 106
GFR calc Af Amer: >60
Potassium: 4
Total Bilirubin: 1.3 — ABNORMAL HIGH
Total Protein: 6.5

## 2011-02-21 LAB — DIFFERENTIAL
Basophils Absolute: 0
Basophils Relative: 1
Eosinophils Relative: 7 — ABNORMAL HIGH
Lymphocytes Relative: 15
Monocytes Absolute: 0.2
Monocytes Relative: 6

## 2011-02-21 LAB — ETHANOL: Alcohol, Ethyl (B): <5

## 2011-02-21 LAB — RAPID URINE DRUG SCREEN, HOSP PERFORMED
Amphetamines: NOT DETECTED
Benzodiazepines: NOT DETECTED
Tetrahydrocannabinol: NOT DETECTED

## 2011-02-21 LAB — CBC
Platelets: 143 — ABNORMAL LOW
RDW: 13.5
WBC: 3.7 — ABNORMAL LOW

## 2011-02-24 LAB — COMPREHENSIVE METABOLIC PANEL WITH GFR
ALT: 18
BUN: 21
CO2: 29
Calcium: 9.1
Creatinine, Ser: 1.1
GFR calc non Af Amer: >60
Glucose, Bld: 105 — ABNORMAL HIGH
Total Protein: 6.6

## 2011-02-24 LAB — RAPID URINE DRUG SCREEN, HOSP PERFORMED
Amphetamines: NOT DETECTED
Barbiturates: NOT DETECTED
Benzodiazepines: NOT DETECTED
Cocaine: POSITIVE — AB
Opiates: NOT DETECTED
Tetrahydrocannabinol: NOT DETECTED

## 2011-02-24 LAB — DIFFERENTIAL
Basophils Absolute: 0.1
Basophils Relative: 1
Eosinophils Absolute: 0.2
Eosinophils Relative: 6 — ABNORMAL HIGH
Lymphocytes Relative: 19
Lymphs Abs: 0.8
Monocytes Absolute: 0.4
Monocytes Relative: 9
Neutro Abs: 2.5
Neutrophils Relative %: 64

## 2011-02-24 LAB — CBC
HCT: 44.5
Hemoglobin: 14.7
MCHC: 32.9
MCV: 91.9
Platelets: 197
RBC: 4.84
RDW: 12.4
WBC: 3.9 — ABNORMAL LOW

## 2011-02-24 LAB — COMPREHENSIVE METABOLIC PANEL
AST: 24
Albumin: 3.6
Alkaline Phosphatase: 55
Chloride: 107
GFR calc Af Amer: >60
Potassium: 3.3 — ABNORMAL LOW
Sodium: 142
Total Bilirubin: 0.9

## 2011-02-24 LAB — ACETAMINOPHEN LEVEL: Acetaminophen (Tylenol), Serum: <10 — ABNORMAL LOW

## 2011-02-24 LAB — SALICYLATE LEVEL: Salicylate Lvl: <4

## 2011-02-24 LAB — ETHANOL: Alcohol, Ethyl (B): <5

## 2011-02-24 LAB — GLUCOSE, CAPILLARY: Glucose-Capillary: 95

## 2011-02-25 LAB — COMPREHENSIVE METABOLIC PANEL
BUN: 21
CO2: 30
Calcium: 9.1
Creatinine, Ser: 1.13
GFR calc non Af Amer: >60
Glucose, Bld: 113 — ABNORMAL HIGH
Sodium: 140
Total Protein: 6.6

## 2011-02-25 LAB — LITHIUM LEVEL
Lithium Lvl: 0.38 — ABNORMAL LOW
Lithium Lvl: <0.25 — ABNORMAL LOW

## 2011-02-25 LAB — CBC
HCT: 44.7
Hemoglobin: 14.7
MCHC: 32.8
MCV: 91.5
RDW: 12.9

## 2011-02-25 LAB — URINALYSIS, ROUTINE W REFLEX MICROSCOPIC
Bilirubin Urine: NEGATIVE
Glucose, UA: NEGATIVE
Ketones, ur: 15 — AB
Protein, ur: NEGATIVE

## 2011-02-25 LAB — DIFFERENTIAL
Lymphocytes Relative: 14
Lymphs Abs: 0.6 — ABNORMAL LOW
Monocytes Relative: 11
Neutro Abs: 3
Neutrophils Relative %: 67

## 2011-02-25 LAB — RAPID URINE DRUG SCREEN, HOSP PERFORMED
Benzodiazepines: POSITIVE — AB
Cocaine: POSITIVE — AB
Opiates: NOT DETECTED
Tetrahydrocannabinol: NOT DETECTED

## 2011-02-26 LAB — URINALYSIS, ROUTINE W REFLEX MICROSCOPIC
Bilirubin Urine: NEGATIVE
Glucose, UA: NEGATIVE
Glucose, UA: NEGATIVE
Ketones, ur: NEGATIVE
Leukocytes, UA: NEGATIVE
Nitrite: NEGATIVE
Nitrite: NEGATIVE
Protein, ur: NEGATIVE
pH: 5
pH: 7

## 2011-02-26 LAB — BASIC METABOLIC PANEL
BUN: 14
BUN: 8
CO2: 32
Chloride: 104
Chloride: 105
Creatinine, Ser: 1.16
GFR calc non Af Amer: >60
Glucose, Bld: 74
Glucose, Bld: 95
Potassium: 3.5
Potassium: 4
Sodium: 138

## 2011-02-26 LAB — POCT I-STAT, CHEM 8
BUN: 15
Calcium, Ion: 1.15
Chloride: 104
Creatinine, Ser: 1.3
Glucose, Bld: 72
TCO2: 25

## 2011-02-26 LAB — CBC
HCT: 46.4
Hemoglobin: 15.2
MCV: 95.7
Platelets: 210
WBC: 7.9

## 2011-02-26 LAB — LITHIUM LEVEL: Lithium Lvl: 0.41 — ABNORMAL LOW

## 2011-02-26 LAB — DIFFERENTIAL
Eosinophils Absolute: 0.3
Eosinophils Relative: 4
Lymphocytes Relative: 14
Lymphs Abs: 1.1
Monocytes Absolute: 0.6
Monocytes Relative: 7

## 2011-02-26 LAB — URINE DRUGS OF ABUSE SCREEN W ALC, ROUTINE (REF LAB)
Benzodiazepines.: NEGATIVE
Ethyl Alcohol: <10
Marijuana Metabolite: NEGATIVE
Phencyclidine (PCP): NEGATIVE
Propoxyphene: NEGATIVE

## 2011-02-26 LAB — URINE MICROSCOPIC-ADD ON: Urine-Other: NONE SEEN

## 2011-02-26 LAB — RAPID URINE DRUG SCREEN, HOSP PERFORMED
Amphetamines: NOT DETECTED
Benzodiazepines: NOT DETECTED
Cocaine: NOT DETECTED
Cocaine: POSITIVE — AB
Opiates: NOT DETECTED
Tetrahydrocannabinol: NOT DETECTED

## 2011-02-26 LAB — COCAINE, URINE, CONFIRMATION: Benzoylecgonine GC/MS Conf: 510 ng/mL

## 2011-02-26 LAB — ETHANOL
Alcohol, Ethyl (B): 49 — ABNORMAL HIGH
Alcohol, Ethyl (B): <5

## 2011-02-27 LAB — ETHANOL: Alcohol: NOT DETECTED mg/dL

## 2011-02-27 LAB — CBC AND DIFFERENTIAL
Basophils Absolute Automated: 0.03 10*3/uL (ref 0.00–0.20)
Basophils Automated: 0 % (ref 0–2)
Eosinophils Absolute Automated: 0.32 10*3/uL (ref 0.00–0.70)
Eosinophils Automated: 6 % — ABNORMAL HIGH (ref 0–5)
Hematocrit: 47.3 % (ref 42.0–52.0)
Hgb: 15.3 g/dL (ref 13.0–17.0)
Immature Granulocytes Absolute: 0 10*3/uL
Immature Granulocytes: 0 % (ref 0–1)
Lymphocytes Absolute Automated: 1.8 10*3/uL (ref 0.50–4.40)
Lymphocytes Automated: 32 % (ref 15–41)
MCH: 28.9 pg (ref 28.0–32.0)
MCHC: 32.3 g/dL (ref 32.0–36.0)
MCV: 89.2 fL (ref 80.0–100.0)
MPV: 10.1 fL (ref 9.4–12.3)
Monocytes Absolute Automated: 0.57 10*3/uL (ref 0.00–1.20)
Monocytes: 10 % (ref 0–11)
Neutrophils Absolute: 2.92 10*3/uL (ref 1.80–8.10)
Neutrophils: 52 % (ref 52–75)
Nucleated RBC: 0 /100 WBC
Platelets: 222 10*3/uL (ref 140–400)
RBC: 5.3 10*6/uL (ref 4.70–6.00)
RDW: 13 % (ref 12–15)
WBC: 5.64 10*3/uL (ref 3.50–10.80)

## 2011-02-27 LAB — BASIC METABOLIC PANEL
BUN: 15 mg/dL (ref 8–20)
CO2: 25 mEq/L (ref 21–30)
Calcium: 9.1 mg/dL (ref 8.6–10.2)
Chloride: 104 mEq/L (ref 98–107)
Creatinine: 1.2 mg/dL (ref 0.6–1.5)
Glucose: 108 mg/dL — ABNORMAL HIGH (ref 70–100)
Potassium: 3.5 mEq/L — ABNORMAL LOW (ref 3.6–5.0)
Sodium: 142 mEq/L (ref 136–146)

## 2011-02-27 LAB — GFR: EGFR: >60

## 2011-02-27 LAB — ACETAMINOPHEN LEVEL: Acetaminophen Level: <10 ug/mL (ref 10–30)

## 2011-02-27 LAB — SALICYLATE LEVEL: Salicylate Level: <1 mg/dL — ABNORMAL LOW (ref 2.8–25.0)

## 2011-02-27 LAB — TSH: TSH: 0.824 u[IU]/mL (ref 0.350–4.940)

## 2011-02-28 ENCOUNTER — Inpatient Hospital Stay
Admission: EM | Admit: 2011-02-28 | Disposition: A | Discharge status: Home / Self Care | Payer: Self-pay | Source: Emergency Department | Attending: Psychiatry | Admitting: Psychiatry

## 2011-02-28 LAB — URINALYSIS, REFLEX TO MICROSCOPIC EXAM IF INDICATED
Bilirubin, UA: NEGATIVE
Blood, UA: NEGATIVE
Glucose, UA: NEGATIVE
Ketones UA: NEGATIVE
Nitrite, UA: NEGATIVE
Specific Gravity UA POCT: 1.015 (ref 1.001–1.035)
Urine pH: 5.5 (ref 5.0–8.0)
Urobilinogen, UA: NORMAL mg/dL

## 2011-02-28 LAB — LITHIUM LEVEL: Lithium Level: <0.1 mEq/L — ABNORMAL LOW (ref 0.5–1.5)

## 2011-02-28 LAB — HEPATIC FUNCTION PANEL
ALT: 22 U/L (ref 3–36)
AST (SGOT): 55 U/L — ABNORMAL HIGH (ref 10–41)
Albumin/Globulin Ratio: 1.4 (ref 1.1–1.8)
Albumin: 4.3 g/dL (ref 3.4–4.9)
Alkaline Phosphatase: 81 U/L (ref 43–112)
Bilirubin Direct: 0.2 mg/dL (ref 0.0–0.3)
Bilirubin Indirect: 0.5 mg/dL (ref 0.1–0.9)
Bilirubin, Total: 0.8 mg/dL (ref 0.1–1.0)
Globulin: 3 g/dL (ref 2.0–3.7)
Protein, Total: 7.3 g/dL (ref 6.0–8.0)

## 2011-02-28 LAB — RAPID DRUG SCREEN, URINE
Barbiturate Screen, UR: NOT DETECTED
Benzodiazepine Screen, UR: NOT DETECTED
Cannabinoid Screen, UR: NOT DETECTED
Cocaine, UR: DETECTED — AB
Opiate Screen, UR: NOT DETECTED
PCP Screen, UR: NOT DETECTED
Urine Amphetamine Screen: NOT DETECTED

## 2011-03-04 LAB — CBC
MCHC: 33
MCV: 91.8
Platelets: 189
RBC: 4.35
WBC: 4.2

## 2011-03-04 LAB — COMPREHENSIVE METABOLIC PANEL
ALT: 29 U/L (ref 3–36)
ALT: 40
AST (SGOT): 26 U/L (ref 10–41)
AST: 98 — ABNORMAL HIGH
Albumin/Globulin Ratio: 1.2 (ref 1.1–1.8)
Albumin: 3.7 g/dL (ref 3.4–4.9)
Albumin: 3.2 — ABNORMAL LOW
Alkaline Phosphatase: 72 U/L (ref 43–112)
BUN: 13 mg/dL (ref 8–20)
Bilirubin, Total: 0.6 mg/dL (ref 0.1–1.0)
CO2: 28 mEq/L (ref 21–30)
Calcium: 9.2 mg/dL (ref 8.6–10.2)
Calcium: 9.1
Chloride: 101 mEq/L (ref 98–107)
Creatinine, Ser: 1.21
Creatinine: 1.1 mg/dL (ref 0.6–1.5)
GFR calc Af Amer: >60
Globulin: 3 g/dL (ref 2.0–3.7)
Glucose: 98 mg/dL (ref 70–100)
Potassium: 4.6 mEq/L (ref 3.6–5.0)
Protein, Total: 6.7 g/dL (ref 6.0–8.0)
Sodium: 140 mEq/L (ref 136–146)
Sodium: 142

## 2011-03-04 LAB — BENZODIAZEPINE, QUANTITATIVE, URINE
Flurazepam GC/MS Conf: NEGATIVE
Nordiazepam GC/MS Conf: NEGATIVE
Oxazepam GC/MS Conf: 340 ng/mL

## 2011-03-04 LAB — DRUGS OF ABUSE SCREEN W/O ALC, ROUTINE URINE
Cocaine Metabolites: POSITIVE — AB
Creatinine,U: 113.7
Marijuana Metabolite: NEGATIVE
Methadone: NEGATIVE
Opiate Screen, Urine: NEGATIVE
Propoxyphene: NEGATIVE

## 2011-03-04 LAB — URINALYSIS, ROUTINE W REFLEX MICROSCOPIC
Bilirubin Urine: NEGATIVE
Hgb urine dipstick: NEGATIVE
Nitrite: NEGATIVE
Protein, ur: NEGATIVE
Specific Gravity, Urine: 1.015
Urobilinogen, UA: 1

## 2011-03-04 LAB — COCAINE, URINE, CONFIRMATION: Benzoylecgonine GC/MS Conf: 620 ng/mL

## 2011-03-04 LAB — VITAMIN B12: Vitamin B-12: 298 pg/mL (ref 211–911)

## 2011-03-04 LAB — T3, FREE: T3, Free: 3.15 pg/mL (ref 1.71–3.71)

## 2011-03-04 LAB — TSH: TSH: 1.497 u[IU]/mL (ref 0.350–4.940)

## 2011-03-04 LAB — FOLATE: Folate: 6.3 ng/mL

## 2011-03-04 LAB — LITHIUM LEVEL: Lithium Level: 0.3 mEq/L — ABNORMAL LOW (ref 0.5–1.5)

## 2011-03-04 LAB — GFR: EGFR: >60

## 2011-03-04 LAB — T4, FREE: T4 Free: 0.94 ng/dL (ref 0.70–1.48)

## 2011-03-08 LAB — LITHIUM LEVEL: Lithium Level: 0.4 mEq/L — ABNORMAL LOW (ref 0.5–1.5)

## 2011-03-11 LAB — URINALYSIS, ROUTINE W REFLEX MICROSCOPIC
Glucose, UA: NEGATIVE
Hgb urine dipstick: NEGATIVE
Protein, ur: NEGATIVE
Specific Gravity, Urine: 1.014
pH: 7

## 2011-03-11 LAB — CBC
HCT: 44.2
Hemoglobin: 14.8
Platelets: 194
RBC: 4.81
WBC: 5.2

## 2011-03-11 LAB — BASIC METABOLIC PANEL
CO2: 28
CO2: 28
Calcium: 9.2
Chloride: 104
GFR calc Af Amer: >60
GFR calc Af Amer: >60
GFR calc non Af Amer: >60
Potassium: 3.7
Sodium: 138
Sodium: 141

## 2011-03-11 LAB — HEPATIC FUNCTION PANEL
ALT: 24
AST: 26
Albumin: 3.5
Total Bilirubin: 0.8

## 2011-03-11 LAB — COCAINE, URINE, CONFIRMATION: Benzoylecgonine GC/MS Conf: 980 ng/mL

## 2011-03-11 LAB — DRUGS OF ABUSE SCREEN W/O ALC, ROUTINE URINE
Amphetamine Screen, Ur: NEGATIVE
Barbiturate Quant, Ur: NEGATIVE
Creatinine,U: 71.9
Marijuana Metabolite: NEGATIVE
Methadone: NEGATIVE

## 2011-03-26 NOTE — H&P (Signed)
[name], [name]      MRN:          [id number]      Account:      [id number]      Document ID:  [id number]                  Admit Date: 02/28/2011            Patient Location: [id number]      Patient Type: I            ATTENDING PHYSICIAN: Gershon Mussel, MD                  CHIEF COMPLAINT:      The patient offers no complaints.            HISTORY OF PRESENT ILLNESS:      This is a 50 year old gentleman who is hospitalized for suicidal ideation      with a plan.  He offers no medical complaints.            HISTORY OF PAST ILLNESS:      The patient denied any history other than some arthritis in his legs.            PSYCHOSOCIAL AND FAMILY HISTORY:      The patient denies any familial history of a medical illness.            REVIEW OF SYSTEMS:      Negative at this time.  Negative weakness, dizziness, headache, or tremors.       No chest pain, shortness of breath, palpitations.  No dyspnea, cough, or      sore throat.  No nausea, vomiting, diarrhea, or constipation.  No urinary      frequency or urgency.  No musculoskeletal symptoms.  No numbness or      tingling and no pain.            PHYSICAL EXAMINATION:      VITAL SIGNS:  Temperature 98.5, pulse 70, respirations 18, blood pressure      112/64.      GENERAL CONDITION:  The patient presents well nourished, well developed.      He is very calm and cooperative, can follow commands and verbalize needs.      He is in no acute distress.      SKIN:  Warm and dry.      SKIN:  No obvious rash or lesions.      HEENT:  Head is grossly intact.  Normocephalic. Pupils are equal and      reactive to light.  Sclerae nonicteric.  Mucous membranes moist.      NECK AND NODES:  Nodes nonpalpable.  No obvious lymphadenopathy, no      thyromegaly, trachea midline.  Neck supple, atraumatic.      CHEST:  Clear to auscultation, full expansion. No adventitious sounds or      respiratory distress.      HEART:  S1, S2 intact.  Regular rate and rhythm.  No obvious murmurs,      gallops or  rubs.      EXTREMITIES:  Distal pulses intact bilaterally, no peripheral edema.                                   Page 1 of 2      [name], [name]      MRN:  [id number]      Account:      [id number]      Document ID:  [id number]                  ABDOMEN:  Soft, nontender, nondistended.  Bowel sounds intact all      quadrants.      MUSCULOSKELETAL AND EXTREMITIES:  Range of motion intact.  Gait normal.      Moves all extremities.  No obvious deformities.      NEUROLOGIC:  Alert and oriented x3.  Cranial nerves II-XII grossly intact.      Muscle strength 5/5.            LABORATORY DATA:      CBC within normal limits.  Chemistry within normal limits with potassium      borderline low at 3.5, glucose slightly elevated at 108.  Enzymes within      normal limits except AST is high at 55.  Therapeutic drug lithium is lower      than 0.1.  Salicylate and acetaminophen are within normal limits.      Toxicology cocaine detected otherwise none detected.            IMPRESSION:      This is a 50 year old male who presents with no medical complaints.            PLAN:      No further followup required at this time.                        Electronic Signing Provider            D:  02/28/2011 18:56 PM by Mr. Delene Loll, NP [phone])      T:  02/28/2011 19:25 PM by [id number]                  cc:                                   Page 2 of 2      Authenticated by Delene Loll, NP [phone]) On 03/02/2011 11:02:53 AM      Authenticated by Nancy Fetter, MD [phone]) On 03/19/2011 01:43:31 PM

## 2011-03-28 LAB — ECG 12-LEAD
Atrial Rate: 83 {beats}/min
P Axis: 48 degrees
P-R Interval: 148 ms
Q-T Interval: 384 ms
QRS Duration: 84 ms
QTC Calculation (Bezet): 451 ms
R Axis: 55 degrees
T Axis: 42 degrees
Ventricular Rate: 83 {beats}/min
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
## 2012-04-20 ENCOUNTER — Emergency Department (HOSPITAL_COMMUNITY)
Admission: EM | Admit: 2012-04-20 | Discharge: 2012-04-21 | Disposition: A | Discharge status: Home / Self Care | Payer: Self-pay | Attending: Emergency Medicine | Admitting: Emergency Medicine

## 2012-04-20 ENCOUNTER — Encounter (HOSPITAL_COMMUNITY): Payer: Self-pay

## 2012-04-20 ENCOUNTER — Telehealth (HOSPITAL_COMMUNITY): Payer: Self-pay

## 2012-04-20 ENCOUNTER — Inpatient Hospital Stay (HOSPITAL_COMMUNITY)
Admission: RE | Admit: 2012-04-20 | Discharge: 2012-04-20 | Disposition: A | Discharge status: Home / Self Care | Payer: Federal, State, Local not specified - Other | Attending: Psychiatry | Admitting: Psychiatry

## 2012-04-20 ENCOUNTER — Encounter (HOSPITAL_COMMUNITY): Payer: Self-pay | Admitting: *Deleted

## 2012-04-20 DIAGNOSIS — Z8739 Personal history of other diseases of the musculoskeletal system and connective tissue: Secondary | ICD-10-CM | POA: Insufficient documentation

## 2012-04-20 DIAGNOSIS — R45851 Suicidal ideations: Secondary | ICD-10-CM | POA: Insufficient documentation

## 2012-04-20 DIAGNOSIS — F319 Bipolar disorder, unspecified: Secondary | ICD-10-CM | POA: Insufficient documentation

## 2012-04-20 DIAGNOSIS — F3289 Other specified depressive episodes: Secondary | ICD-10-CM | POA: Insufficient documentation

## 2012-04-20 DIAGNOSIS — F329 Major depressive disorder, single episode, unspecified: Secondary | ICD-10-CM | POA: Insufficient documentation

## 2012-04-20 DIAGNOSIS — Z87891 Personal history of nicotine dependence: Secondary | ICD-10-CM | POA: Insufficient documentation

## 2012-04-20 HISTORY — DX: Bipolar disorder, unspecified: F31.9

## 2012-04-20 NOTE — ED Notes (Signed)
Pt states came from [facility] for medical clearance; states is new to the area from [place]; his medicare ran out and he ran out of meds two wks ago; has started cutting; having some suicidal thoughts; calm and cooperative
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
## 2012-04-20 NOTE — ED Notes (Signed)
Pt states has been out of meds for two wks because of lack of Medicare; states started cutting himself again and needs to be admitted for suicidal thoughts.  Calm and cooperative; states has gone to [facility] and told to come for medical clearance; denies hearing voices; denies homicidal ideations

## 2012-04-21 ENCOUNTER — Encounter (HOSPITAL_COMMUNITY): Payer: Self-pay

## 2012-04-21 ENCOUNTER — Inpatient Hospital Stay (HOSPITAL_COMMUNITY)
Admission: RE | Admit: 2012-04-21 | Discharge: 2012-04-23 | DRG: 885 | Disposition: A | Discharge status: Home / Self Care | Payer: Federal, State, Local not specified - Other | Attending: Psychiatry | Admitting: Psychiatry

## 2012-04-21 DIAGNOSIS — F3160 Bipolar disorder, current episode mixed, unspecified: Secondary | ICD-10-CM

## 2012-04-21 DIAGNOSIS — R45851 Suicidal ideations: Secondary | ICD-10-CM

## 2012-04-21 DIAGNOSIS — F312 Bipolar disorder, current episode manic severe with psychotic features: Principal | ICD-10-CM | POA: Diagnosis present

## 2012-04-21 DIAGNOSIS — F3112 Bipolar disorder, current episode manic without psychotic features, moderate: Secondary | ICD-10-CM

## 2012-04-21 DIAGNOSIS — M129 Arthropathy, unspecified: Secondary | ICD-10-CM | POA: Diagnosis present

## 2012-04-21 LAB — COMPREHENSIVE METABOLIC PANEL
BUN: 14 mg/dL (ref 6–23)
CO2: 27 mEq/L (ref 19–32)
Calcium: 9.6 mg/dL (ref 8.4–10.5)
Creatinine, Ser: 0.98 mg/dL (ref 0.50–1.35)
GFR calc Af Amer: >90 mL/min (ref >90)
GFR calc non Af Amer: >90 mL/min (ref >90)
Glucose, Bld: 97 mg/dL (ref 70–99)

## 2012-04-21 LAB — RAPID URINE DRUG SCREEN, HOSP PERFORMED
Barbiturates: NOT DETECTED
Cocaine: NOT DETECTED
Opiates: NOT DETECTED

## 2012-04-21 LAB — URINE MICROSCOPIC-ADD ON

## 2012-04-21 LAB — URINALYSIS, ROUTINE W REFLEX MICROSCOPIC
Bilirubin Urine: NEGATIVE
Glucose, UA: NEGATIVE mg/dL
Ketones, ur: NEGATIVE mg/dL
pH: 6 (ref 5.0–8.0)

## 2012-04-21 LAB — CBC
Hemoglobin: 14.9 g/dL (ref 13.0–17.0)
MCH: 29.3 pg (ref 26.0–34.0)
MCV: 89.2 fL (ref 78.0–100.0)
RBC: 5.08 MIL/uL (ref 4.22–5.81)

## 2012-04-21 MED ORDER — RISPERIDONE 0.5 MG PO TABS
0.5000 mg | ORAL_TABLET | Freq: Two times a day (BID) | ORAL | Status: DC
  Administered 2012-04-21 – 2012-04-23 (×4): 0.5 mg via ORAL
  Filled 2012-04-21 (×3): qty 1
  Filled 2012-04-21: qty 28
  Filled 2012-04-21 (×3): qty 1
  Filled 2012-04-21: qty 28

## 2012-04-21 MED ORDER — NICOTINE 21 MG/24HR TD PT24
21.0000 mg | MEDICATED_PATCH | Freq: Every day | TRANSDERMAL | Status: DC
  Filled 2012-04-21 (×3): qty 1

## 2012-04-21 MED ORDER — ALUM & MAG HYDROXIDE-SIMETH 200-200-20 MG/5ML PO SUSP: 30.0000 mL | ORAL | Status: DC | PRN

## 2012-04-21 MED ORDER — MAGNESIUM HYDROXIDE 400 MG/5ML PO SUSP: 30.0000 mL | Freq: Every day | ORAL | Status: DC | PRN

## 2012-04-21 MED ORDER — LAMOTRIGINE 25 MG PO TABS
25.0000 mg | ORAL_TABLET | Freq: Two times a day (BID) | ORAL | Status: DC
  Administered 2012-04-21 – 2012-04-23 (×4): 25 mg via ORAL
  Filled 2012-04-21: qty 28
  Filled 2012-04-21 (×4): qty 1
  Filled 2012-04-21: qty 28
  Filled 2012-04-21 (×3): qty 1

## 2012-04-21 MED ORDER — TRAZODONE HCL 50 MG PO TABS
50.0000 mg | ORAL_TABLET | Freq: Every evening | ORAL | Status: DC | PRN
  Filled 2012-04-21: qty 1
  Filled 2012-04-21 (×2): qty 28
  Filled 2012-04-21 (×5): qty 1

## 2012-04-21 MED ORDER — ACETAMINOPHEN 325 MG PO TABS
650.0000 mg | ORAL_TABLET | Freq: Four times a day (QID) | ORAL | Status: DC | PRN
  Administered 2012-04-21: 650 mg via ORAL

## 2012-04-21 MED ORDER — HYDROXYZINE HCL 50 MG PO TABS: 100.0000 mg | ORAL_TABLET | Freq: Every evening | ORAL | Status: DC

## 2012-04-21 MED ORDER — HYDROXYZINE HCL 50 MG PO TABS
100.0000 mg | ORAL_TABLET | Freq: Every day | ORAL | Status: DC
  Administered 2012-04-21 – 2012-04-22 (×2): 100 mg via ORAL
  Filled 2012-04-21: qty 28
  Filled 2012-04-21 (×3): qty 2

## 2012-04-21 NOTE — Progress Notes (Signed)
[facility] Group Notes:  (Counselor/Nursing/MHT/Case Management/Adjunct) Emotion Regulation 1:15-2:30  04/21/2012 2:38 PM  Type of Therapy:  Group Therapy  Participation Level:  Active  Participation Quality:  Attentive, Intrusive, Monopolizing and Redirectable  Affect:  Appropriate and Excited  Cognitive:  Alert and Oriented  Insight:  Good  Engagement in Group:  Good  Engagement in Therapy:  Good  Modes of Intervention:  Education, Problem-solving and Support  Summary of Progress/Problems: Patient states that he struggles with feelings of anger and aggression.  His speech was very pressured and he had to be redirected during the session.  He stated that one way to regulate his emotions is to stay away from people that trigger him.    Bournes, Roshelle L 04/21/2012, 2:38 PM

## 2012-04-21 NOTE — ED Notes (Signed)
Britta Mccreedy RN from  [employer] that pt could return to their facility

## 2012-04-21 NOTE — ED Provider Notes (Signed)
History     CSN: [id number]  Arrival date & time 04/20/12  2342   First MD Initiated Contact with Patient 04/20/12 2345      Chief Complaint  Patient presents with  . Medical Clearance    (Consider location/radiation/quality/duration/timing/severity/associated sxs/prior treatment) HPI History provided by patient. History of bipolar and has been off his medications for about the last 2 weeks. He is currently living in the [place] area and traveling here and now having suicidal thoughts. He is thinking about cutting his wrists. He denies any self-inflicted injury. He denies any drug or alcohol use. He has history of same and was admitted to behavioral health about 2 years ago for the same. He would get help tonight, was told he has a bed available but needs to come here for medical clearance and lab work. He currently does feel suicidal. Moderate severity. No homicidal ideation. He denies any hallucinations. Symptoms have been progressively worsening and constant over the last few days. Past Medical History  Diagnosis Date  . Depression   . Arthritis   . Bipolar 1 disorder     History reviewed. No pertinent past surgical history.  No family history on file.  History  Substance Use Topics  . Smoking status: Former Games developer  . Smokeless tobacco: Not on file  . Alcohol Use: No      Review of Systems  Constitutional: Negative for fever and chills.  HENT: Negative for neck pain and neck stiffness.   Eyes: Negative for pain.  Respiratory: Negative for shortness of breath.   Cardiovascular: Negative for chest pain.  Gastrointestinal: Negative for abdominal pain.  Genitourinary: Negative for dysuria.  Musculoskeletal: Negative for back pain.  Skin: Negative for rash.  Neurological: Negative for headaches.  Psychiatric/Behavioral: Positive for suicidal ideas.  All other systems reviewed and are negative.    Allergies  Review of patient's allergies indicates no known  allergies.  Home Medications   Current Outpatient Rx  Name  Route  Sig  Dispense  Refill  . DIPHENHYDRAMINE HCL 25 MG PO CAPS   Oral   Take 25 mg by mouth at bedtime as needed. For sleep           BP 129/73  Pulse 79  Temp 98.5 F (36.9 C)  Resp 20  SpO2 98%  Physical Exam  Constitutional: He is oriented to person, place, and time. He appears well-developed and well-nourished.  HENT:  Head: Normocephalic and atraumatic.  Eyes: EOM are normal. Pupils are equal, round, and reactive to light.  Neck: Neck supple.  Cardiovascular: Regular rhythm and intact distal pulses.   Pulmonary/Chest: Effort normal. No respiratory distress.  Musculoskeletal: Normal range of motion. He exhibits no edema.  Neurological: He is alert and oriented to person, place, and time.  Skin: Skin is warm and dry.  Psychiatric:       Some pressured speech. Suicidal ideation.    ED Course  Procedures (including critical care time)  Results for orders placed during the hospital encounter of 04/20/12  CBC      Component Value Range   WBC 5.0  4.0 - 10.5 K/uL   RBC 5.08  4.22 - 5.81 MIL/uL   Hemoglobin 14.9  13.0 - 17.0 g/dL   HCT 40.9  81.1 - 91.4 %   MCV 89.2  78.0 - 100.0 fL   MCH 29.3  26.0 - 34.0 pg   MCHC 32.9  30.0 - 36.0 g/dL   RDW 78.2  95.6 - 21.3 %  Platelets 223  150 - 400 K/uL  COMPREHENSIVE METABOLIC PANEL      Component Value Range   Sodium 139  135 - 145 mEq/L   Potassium 3.9  3.5 - 5.1 mEq/L   Chloride 104  96 - 112 mEq/L   CO2 27  19 - 32 mEq/L   Glucose, Bld 97  70 - 99 mg/dL   BUN 14  6 - 23 mg/dL   Creatinine, Ser 4.09  0.50 - 1.35 mg/dL   Calcium 9.6  8.4 - 81.1 mg/dL   Total Protein 7.3  6.0 - 8.3 g/dL   Albumin 3.8  3.5 - 5.2 g/dL   AST 23  0 - 37 U/L   ALT 18  0 - 53 U/L   Alkaline Phosphatase 68  39 - 117 U/L   Total Bilirubin 0.4  0.3 - 1.2 mg/dL   GFR calc non Af Amer >90  >90 mL/min   GFR calc Af Amer >90  >90 mL/min  ETHANOL      Component Value Range    Alcohol, Ethyl (B) <11  0 - 11 mg/dL  URINE RAPID DRUG SCREEN (HOSP PERFORMED)      Component Value Range   Opiates NONE DETECTED  NONE DETECTED   Cocaine NONE DETECTED  NONE DETECTED   Benzodiazepines NONE DETECTED  NONE DETECTED   Amphetamines NONE DETECTED  NONE DETECTED   Tetrahydrocannabinol NONE DETECTED  NONE DETECTED   Barbiturates NONE DETECTED  NONE DETECTED    ACT consult. Pharmacy consult in for med rec. Patient knows most of his medications but not always doses. Will restart home medications as soon as med rec is completed. Labs, UA and UDS were ordered and plan psych admit.  MDM   Suicidal ideation with history of bipolar and off of medications. Vital signs and nursing notes reviewed. Sit or bedside. Labs. UA. UDS all reviewed as above. Patient is medically cleared for psych admit. 1:23 AM         Sunnie Nielsen, MD 04/21/12 [phone]
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
## 2012-04-21 NOTE — Progress Notes (Signed)
Psychoeducational Group Note  Date:  04/21/2012 Time:  2000  Group Topic/Focus:  Wrap-Up Group:   The focus of this group is to help patients review their daily goal of treatment and discuss progress on daily workbooks.  Participation Level:  Active  Participation Quality:  Appropriate  Affect:  Excited  Cognitive:  Alert  Insight:  Good  Engagement in Group:  Good  Additional Comments:  Pt stated that he has had an ok day. Pt had to be redirected several times because he was having a hard time giving others the opportunity to talk and tell about their day.  Kaleen Odea R 04/21/2012, 9:40 PM

## 2012-04-21 NOTE — Social Work (Signed)
Aftercare Planning Group: 04/21/2012 9:45 AM  Pt attended discharge planning group and actively participated in group.  CSW provided pt with today's workbook.  Pt presents with  passive suicidal ideations and rates himself a 10 for both depression and anxiety. Patient reports she's been out of his medications for 2 weeks and has had racing thoughts and has been hyperverbal. Patient reports he is back from 6 months day in [place] and is currently living with his mother where he plans to stay at discharge.

## 2012-04-21 NOTE — Progress Notes (Signed)
[facility] Group Notes:  (Counselor/Nursing/MHT/Case Management/Adjunct)  04/21/2012 1:19 PM  Type of Therapy:  Psychoeducational Skills  Participation Level:  Active  Participation Quality:  Attentive, Intrusive, Monopolizing, Redirectable and Sharing  Affect:  Excited  Cognitive:  Alert and Oriented  Insight:  Limited  Engagement in Group:  Good  Engagement in Therapy:  n/a  Modes of Intervention:  Activity, Education, Limit-setting, Problem-solving, Socialization and Support  Summary of Progress/Problems: "Yohann" attended psychoeducational group on labels. 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 Roxy Manns was active, discussed not liking to be defined by mental health diagnosis but did find it helpful to have a diagnosis of "PTSD" so that he could understand what was going on with him more, could not verbalize understanding of why it is helpful to know he has a label of "Bipolar" while group discussed what labels are, how we use them, how they effect the way we think about and perceive the world, and listed positive and negative labels they have used or been called. Roxy Manns was given homework assignment to list 10 words he has been labeled and to find the reality of the situation/label.    Wandra Scot 04/21/2012, 1:19 PM

## 2012-04-21 NOTE — Progress Notes (Signed)
Patient ID: [name], male   DOB: April 11, 1961, 51 y.o.   MRN: [id number] D: pt. Sitting in dayroom interacting. Pt. Reports "needed to get back on my meds" "I came for Thanksgiving holidays and been out of med" "I like being manic you know you know travel and stuff, I need to be able to talk to people, but when I crash I get suicidal" Staff will monitor q63min for safety. Pt. Encouraged to go to groups. A: Pt. Is safe on the unit.  Writer encouraged pt. To stay on meds and plan ahead when traveling. R: Pt. Notes "Yea I need to fine a happy medium" Pt. Is safe on the unit. Pt. Attended and participated in group.

## 2012-04-21 NOTE — Progress Notes (Signed)
Patient ID: [name], male   DOB: [birth year], 51 y.o.   MRN: [id number]  Admission Note:  51 yr male who presents voluntary in no acute distress for the treatment of SI and Depression. Pt appears flat and depressed. Pt was calm and cooperative with admission process. Pt presents with passive SI and contracts for safety upon admission. Pt  Positive for  AH, but denies VH .Pt has Past medical Hx of Arthritis  and Depression . Pt complained of chronic knee pain in R-knee. Skin was assessed and found 2 scars on L-temple area, 2 scars on R-forearm, 1 scar on L-forearm, 1 scar Lbk scar, bruise R- lower back, 2 bruises L- hip area. POC and unit policies explained and understanding verbalized. Consents obtained. Food and fluids offered, and fluids accepted. Pt had no additional questions or concerns at this time.  A: Pt admitted to unit per protocol, skin assessment and search done.  Pt  educated on therapeutic milieu rules. Pt was introduced to milieu by nursing staff.    R: Pt was receptive to education about the milieu .  15 min safety checks started. Clinical research associate offered support

## 2012-04-21 NOTE — ED Notes (Signed)
[name] in Assessment states is still waiting to hear back from doctor pending patient acceptance; pt calm and cooperative; sitter at bedside

## 2012-04-21 NOTE — Tx Team (Signed)
Initial Interdisciplinary Treatment Plan  PATIENT STRENGTHS: (choose at least two) Ability for insight Average or above average intelligence Communication skills General fund of knowledge Motivation for treatment/growth  PATIENT STRESSORS: Loss of Grandmother* Medication change or noncompliance   PROBLEM LIST: Problem List/Patient Goals Date to be addressed Date deferred Reason deferred Estimated date of resolution  Suicide 04/21/12     Depression     04/21/12                                                DISCHARGE CRITERIA:  Ability to meet basic life and health needs Improved stabilization in mood, thinking, and/or behavior Motivation to continue treatment in a less acute level of care Verbal commitment to aftercare and medication compliance  PRELIMINARY DISCHARGE PLAN: Attend aftercare/continuing care group  PATIENT/FAMIILY INVOLVEMENT: This treatment plan has been presented to and reviewed with the patient, [name].  The patient and family have been given the opportunity to ask questions and make suggestions.  Jacques Navy A 04/21/2012, 3:52 AM

## 2012-04-21 NOTE — Progress Notes (Signed)
D: Patient appropriate and cooperative with staff and peers. Declined self inventory sheet today. Patient verbalized that he's very manic and would like to get back on his medication.   A: Support and encouragement provided to patient. Scheduled medications administered per MD orders. Monitor 15 minute observation for safety.   R: Patient receptive. Denies SI/HI/VH, but does have passive auditory hallucinations. Patient verbalized that he's not hearing voices at this time. Patient remains safe.

## 2012-04-21 NOTE — Progress Notes (Signed)
Patient ID: [name], male   DOB: [birth year], 51 y.o.   MRN: [id number] Pt. Voice loud in day room, pt. Exhibiting labile behavior. Writer notified and spoke to pt. Asking him to lower voice and not insist other listen to his opinions about religion as his terms where making other clients uncomfortable. Pt. Got angry, and refused to lower voice. Finally pt. Came to med window demanded sleep  meds and went to room. Pt. Report he cannot take Trazodone, but did take hydroxyzine. Writer will monitor for effectiveness.
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
## 2012-04-21 NOTE — Social Work (Signed)
Interdisciplinary Treatment Plan Update (Adult)  Date:  04/21/2012  Time Reviewed:  6:55 AM    Progress in Treatment: Attending groups:   Yes   Participating in groups:  Yes Taking medication as prescribed:  Yes Tolerating medication:  Yes Family/Significant othe contact made: Not at this time Patient understands diagnosis:  Yes Discussing patient identified problems/goals with staff: Yes Medical problems stabilized or resolved: Yes Denies suicidal/homicidal ideation: Passive suicidal ideations -  Patient able to contract for safety Issues/concerns per patient self-inventory:  Other:  New problem(s) identified:  Reason for Continuation of Hospitalization: Anxiety Depression Medication stabilization Suicidal ideation  Interventions implemented related to continuation of hospitalization:  Medication mgement; safety checks q 15 mins; coping skills development  Additional comments: Patient reports he 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 Patient says he's been out of his meds for 2 weeks has had racing thoughts and has been hyperverbal. Patient reports he has passive suicidal ideations and has started cutting again. Patient says he is grieving the loss of his grandmother who died during the holidays. Patient was in this facility 2 years ago. Patient has not been in therapy since last hospitalization.  Estimated length of stay: 3-5 days  Discharge Plan:  Outpatient follow up  New goal(s):  Review of initial/current patient goals per problem list:    1.  Goal(s): Eliminate SI/other thoughts of self harm   Met:  No  Target date: d/c  As evidenced by: Patient will no longer endorse SI/HI or other thoughts of self harm.    2.  Goal (s):Reduce depression/anxiety  Met: Yes  Target date: d/c  As evidenced by: Patient will rate symptoms at four or below    3.  Goal(s):.stabilize on meds   Met:  No  Target date: d/c  As  evidenced by: Patient will report being stabilized on medications - less symptomatic    4.  Goal(s): Refer for outpatient follow up   Met:  No  Target date: d/c  As evidenced by: Follow up appointment will be scheduled    Attendees: Patient:   @TD  6:55 AM  Physican:  Patrick North, MD @TD  6:55 AM  Nursing:  Burnetta Sabin, RN  04/21/2012 6:55 AM  Nursing:    @TD  6:55 AM  Clinical Social Worker:  Patton Salles, LCSW @TD  6:55 AM  Other: Armandina Stammer, PMH-NP  04/21/2012 6:55 AM   Other: Patton Salles LCSW        04/21/2012 6:55 AM Other:

## 2012-04-22 DIAGNOSIS — F314 Bipolar disorder, current episode depressed, severe, without psychotic features: Secondary | ICD-10-CM | POA: Insufficient documentation

## 2012-04-22 DIAGNOSIS — F312 Bipolar disorder, current episode manic severe with psychotic features: Principal | ICD-10-CM

## 2012-04-22 MED ORDER — IBUPROFEN 600 MG PO TABS
600.0000 mg | ORAL_TABLET | Freq: Four times a day (QID) | ORAL | Status: DC | PRN
  Administered 2012-04-22 – 2012-04-23 (×3): 600 mg via ORAL
  Filled 2012-04-22 (×3): qty 1

## 2012-04-22 MED ORDER — ACETAMINOPHEN 325 MG PO TABS
650.0000 mg | ORAL_TABLET | Freq: Four times a day (QID) | ORAL | Status: DC | PRN
  Administered 2012-04-22: 650 mg via ORAL

## 2012-04-22 NOTE — Progress Notes (Signed)
Patient ID: [name], male   DOB: 04-21-1961, 51 y.o.   MRN: [id number] D: Patient lying in bed with eyes closed. Appears to be sleeping. Respirations even and non-labored. A: Staff will monitor on q 15 minute checks, follow treatments and give meds as ordered. R: No response from patient due to sleeping.

## 2012-04-22 NOTE — Progress Notes (Signed)
Patient ID: [name], male   DOB: 1961-04-14, 51 y.o.   MRN: [id number] Patient is interacting well with staff and peers on the unit.  He reports no racing thoughts or SI/HI/AVH this morning.  He requested tylenol for arthritic pain in this right knee and also has ibuprofen ordered for same.  Patient reported to nursing that he is not suicidal this morning.  Continue to monitor for medication management and MD orders.  Safety checks continued every 15 minutes per protocol.  His behavior has been appropriate; encourage and support patient.

## 2012-04-22 NOTE — Progress Notes (Signed)
[facility] MD Progress Note  04/22/2012 9:44 AM Linard Mcgarrity  MRN:  [id number].  S: "I came in to this hospital as a walk-in on Tuesday.  I was actually travelling from [place] to [place] to see my mother. I have not seen her in a while. 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 I have bipolar disorder, and I do have bad mood swings. I am an activist. And because I have not been on my medications, I decompensated. I became manic, started hearing voices. Then I felt suicidal. I got to [place], but I did not want my mother to see me this way, while I'm experiencing racing thoughts and rapid speech. I have put her through so much already. Since I am back on my medications, the voices are slowing down. I am still a little manic, but I have some control. That is why I am in my room. I don't want to have to bother my fellow patients with my rapid speech. My suicidal thoughts are off and on, but no plans to hurt myself or others. I am sleeping well. I am having some arthritic pain to my left knee".  ROS: Large stature, Negative for fever.  HENT: Negative for congestion and rhinorrhea.  Respiratory: Negative for cough, chest tightness and or shortness of breath.  Cardiovascular: Negative for chest pain.  Gastrointestinal: Negative for nausea, vomiting and abdominal pain.  Skin: Negative for rash.  Neurological: Negative for weakness and headaches. Musculoskeletal: Complained of arthritic pain to left knee.  Diagnosis:   Axis I: Bipolar disorder, current episode manic severe with psychotic features Axis II: Deferred Axis III:  Past Medical History  Diagnosis Date  . Depression   . Arthritis   . Bipolar 1 disorder    Axis IV: other psychosocial or environmental problems Axis V: 51-60 moderate symptoms  ADL's:  Intact  Sleep: Good  Appetite:  Good  Suicidal Ideation:  Denies Homicidal Ideation:  Denies  AEB (as evidenced by):  Per patient's reports.  Mental Status Examination/Evaluation: Objective:  Appearance: Casual, Neat and Large stature  Eye Contact::  Good  Speech:  Clear and Coherent and semi-pressured  Volume:  Increased, rapid  Mood:  Euphoric  Affect:  Appropriate and Congruent  Thought Process:  Circumstantial  Orientation:  Full  Thought Content:  "My hearing voices is slowing down", hyper-religiosity.  Suicidal Thoughts:  No  Homicidal Thoughts:  No  Memory:  Immediate;   Good Recent;   Good Remote;   Good  Judgement:  Fair  Insight:  Good  Psychomotor Activity:  Excited  Concentration:  Fair  Recall:  Good  Akathisia:  No  Handed:  Right  AIMS (if indicated):     Assets:  Communication Skills Desire for Improvement  Sleep:  Number of Hours: 6    Vital Signs:Blood pressure 96/61, pulse 102, temperature 98.2 F (36.8 C), temperature source Oral, resp. rate 18, height 6\' 2"  (1.88 m), weight 112.492 kg (248 lb). Current Medications: Current Facility-Administered Medications  Medication Dose Route Frequency Provider Last Rate Last Dose  . acetaminophen (TYLENOL) tablet 650 mg  650 mg Oral Q6H PRN Himabindu Ravi, MD   650 mg at 04/22/12 0813  . alum & mag hydroxide-simeth (MAALOX/MYLANTA) 200-200-20 MG/5ML suspension 30 mL  30 mL Oral Q4H PRN Kerry Hough, PA      . hydrOXYzine (ATARAX/VISTARIL) tablet 100 mg  100 mg Oral QHS Himabindu  Daleen Bo, MD   100 mg at 04/21/12 2146  . ibuprofen (ADVIL,MOTRIN) tablet 600 mg  600 mg Oral Q6H PRN Sanjuana Kava, NP      . lamoTRIgine (LAMICTAL) tablet 25 mg  25 mg Oral BID Himabindu Ravi, MD   25 mg at 04/22/12 0813  . magnesium hydroxide (MILK OF MAGNESIA) suspension 30 mL  30 mL Oral Daily PRN Kerry Hough, PA      . risperiDONE (RISPERDAL) tablet 0.5 mg  0.5 mg Oral BID Himabindu Ravi, MD   0.5 mg at 04/22/12 0813  . traZODone (DESYREL) tablet 50 mg  50 mg Oral QHS,MR X 1 Kerry Hough, PA      . [DISCONTINUED] acetaminophen (TYLENOL) tablet  650 mg  650 mg Oral Q6H PRN Kerry Hough, PA   650 mg at 04/21/12 1714  . [DISCONTINUED] hydrOXYzine (ATARAX/VISTARIL) tablet 100 mg  100 mg Oral Nightly Himabindu Ravi, MD      . [DISCONTINUED] nicotine (NICODERM CQ - dosed in mg/24 hours) patch 21 mg  21 mg Transdermal Q0600 Kerry Hough, PA        Lab Results:  Results for orders placed during the hospital encounter of 04/21/12 (from the past 48 hour(s))  URINALYSIS, ROUTINE W REFLEX MICROSCOPIC     Status: Abnormal   Collection Time   04/21/12  6:49 AM      Component Value Range Comment   Color, Urine YELLOW  YELLOW    APPearance CLEAR  CLEAR    Specific Gravity, Urine 1.023  1.005 - 1.030    pH 6.0  5.0 - 8.0    Glucose, UA NEGATIVE  NEGATIVE mg/dL    Hgb urine dipstick SMALL (*) NEGATIVE    Bilirubin Urine NEGATIVE  NEGATIVE    Ketones, ur NEGATIVE  NEGATIVE mg/dL    Protein, ur NEGATIVE  NEGATIVE mg/dL    Urobilinogen, UA 0.2  0.0 - 1.0 mg/dL    Nitrite NEGATIVE  NEGATIVE    Leukocytes, UA LARGE (*) NEGATIVE   URINE MICROSCOPIC-ADD ON     Status: Normal   Collection Time   04/21/12  6:49 AM      Component Value Range Comment   Squamous Epithelial / LPF RARE  RARE    WBC, UA 7-10  <3 WBC/hpf    RBC / HPF 3-6  <3 RBC/hpf    Bacteria, UA RARE  RARE    Urine-Other MUCOUS PRESENT   AMORPHOUS URATES/PHOSPHATES    Physical Findings: AIMS: Facial and Oral Movements Muscles of Facial Expression: None, normal Lips and Perioral Area: None, normal Jaw: None, normal Tongue: None, normal,Extremity Movements Upper (arms, wrists, hands, fingers): None, normal Lower (legs, knees, ankles, toes): None, normal, Trunk Movements Neck, shoulders, hips: None, normal, Overall Severity Severity of abnormal movements (highest score from questions above): None, normal Incapacitation due to abnormal movements: None, normal Patient's awareness of abnormal movements (rate only patient's report): No Awareness, Dental Status Current  problems with teeth and/or dentures?: Yes (some missing teeth) Does patient usually wear dentures?: No  CIWA:  CIWA-Ar Total: 0  COWS:  COWS Total Score: 0   Treatment Plan Summary: Daily contact with patient to assess and evaluate symptoms and progress in treatment Medication management  Plan: Ibuprofen 600 mg Q 6 hours PRN for arthritic pains. Continue current treatment plan in progress.  [name] 04/22/2012, 9:44 AM

## 2012-04-23 MED ORDER — LAMOTRIGINE 25 MG PO TABS: 25.0000 mg | ORAL_TABLET | Freq: Two times a day (BID) | ORAL | Status: DC

## 2012-04-23 MED ORDER — TRAZODONE HCL 50 MG PO TABS: 50.0000 mg | ORAL_TABLET | Freq: Every evening | ORAL | Status: DC | PRN

## 2012-04-23 MED ORDER — HYDROXYZINE HCL 50 MG PO TABS: 100.0000 mg | ORAL_TABLET | Freq: Every day | ORAL | Status: DC

## 2012-04-23 MED ORDER — RISPERIDONE 0.5 MG PO TABS: 0.5000 mg | ORAL_TABLET | Freq: Two times a day (BID) | ORAL | Status: DC

## 2012-04-23 NOTE — Progress Notes (Addendum)
D: Pt complains of "voices" and feeling "manic".  States voices and SI are intermittent but much better than on admission.  Pt in exceptionally good mood on evening shift- affect appropriate to circumstances, mood euthymic, facial expression animated, with sustained eye contact.  Stated "I'm proud of myself for coming here and not worrying my mom".  Interaction was assertive and appropriate; Pt did request affirmation that he had acted correctly in coming to [facility] without notifying mother first so as not to spoil her Thanksgiving (statement turned back to him to answer- he was affirmative).  Cooperative and compliant with all milieu rules and medications; attended evening shift karaoke group, stated "it pushed the voices way back".  Speech logical and coherent with normal rate/rhythm/volume; aside from intermittent AH, Pt presented no evidence of distorted thought process.  Reports intermittent SI and AH; denies HI and VH.  Contracting for safety on unit.  Reported pain in L knee from arthritis at 2230 rated at 10/10.  States discharge is planned for tomorrow and he will be meeting up with his mother.  A: Engaged Pt in 1:1, encouraged him to argue pros and cons of staying on meds after discharge aloud so that I could provide feedback.  Support and encouragement provided.  Medications administered according to med orders and POC.  Pt took prescribed Vistaril but refused Trazodone.  Administered PRN Ibuprofen 600mg  PO for knee pain @2232 ; pain recheck at 2330 rated at 3/10.  Q15 minute safety checks maintained as per unit protocol.  R: Pt responded to all overtures and interventions in a friendly, polite, and pleasant manner.  Safety maintained. Dion Saucier RN

## 2012-04-23 NOTE — Progress Notes (Signed)
[facility] Adult Inpatient Family/Significant Other Suicide Prevention Education  Suicide Prevention Education:  Patient Refusal for Family/Significant Other Suicide Prevention Education: The patient [name] has refused to provide written consent for family/significant other to be provided Family/Significant Other Suicide Prevention Education during admission and/or prior to discharge.  Physician notified.  Patton Salles 04/23/2012, 12:58 PM

## 2012-04-23 NOTE — Social Work (Signed)
Interdisciplinary Treatment Plan Update (Adult)  Date:  04/23/2012  Time Reviewed:  6:52 AM    Progress in Treatment: Attending groups:   Yes   Participating in groups:  Yes Taking medication as prescribed:  Yes Tolerating medication:  Yes Family/Significant othe contact made: Patient refuses family contact by social worker Patient understands diagnosis:  Yes Discussing patient identified problems/goals with staff: Yes Medical problems stabilized or resolved: Yes Denies suicidal/homicidal ideation: Yes Issues/concerns per patient self-inventory:  Other:  New problem(s) identified: None mentioned     Interventions implemented related to continuation of hospitalization:  Medication mgement; safety checks q 15 mins; coping skills development  Additional comments: Patient reports he is ready for discharge today and agreed to comply with his medication regimen to avoid potential rehospitalization. Patient reports he is sleeping well, says his family has begged him to come home, yet refused to allow social worker to call his family members to go over suicide prevention information brochure saying he doesn't want to burden his family more. Patient reports his voices are coming and says today he is at his baseline.  Estimated length of stay: Today  Discharge Plan:  Outpatient follow up scheduled  New goal(s):  Review of initial/current patient goals per problem list:    1.  Goal(s): Eliminate SI/other thoughts of self harm   Met:  Yes  Target date: d/c  As evidenced by: Patient will no longer endorse SI/HI or other thoughts of self harm.    2.  Goal (s):Reduce depression/anxiety  Met: Yes  Target date: d/c  As evidenced by: Patient will rate symptoms at four or below    3.  Goal(s):.stabilize on meds   Met:  Yes  Target date: d/c  As evidenced by: Patient will report being stabilized on medications - less symptomatic    4.  Goal(s): Refer for outpatient  follow up   Met:  Yes  Target date: d/c  As evidenced by: Follow up appointment will be scheduled    Attendees: Patient:   @TD  6:52 AM  Physican:  Dr. Lolly Mustache @TD  6:52 AM  Nursing: Harold Barban, RN  04/23/2012 6:52 AM  Nursing:    @TD  6:52 AM  Clinical Social Worker:  Patton Salles, LCSW @TD  6:52 AM  Other: Joanell Rising 04/23/2012 6:52 AM   Other:         04/23/2012 6:52 AM Other:

## 2012-04-23 NOTE — Progress Notes (Signed)
Discharge Note  D: Patient mood was appropriate to the circumstance. Patient verbalized that he was glad about being discharged to go home.  A: Support and encouragement provided. Administered scheduled medications per MD orders. Discharge instructions/prescriptions/medication samples given to patient. Returned belongings to patient.  R: Patient receptive. Patient verbalized understanding of discharge instructions and prescriptions. Denies SI/HI. Patient d/c without incident.

## 2012-04-23 NOTE — Progress Notes (Signed)
[facility] Case Management Discharge Plan:  Will you be returning to the same living situation after discharge: No. will return to mother's home At discharge, do you have transportation home?:Yes,  Patient will need bus passes Do you have the ability to pay for your medications:Yes,    yes  Interagency Information:     Release of information consent forms completed and in the chart;  Patient's signature needed at discharge.  Patient to Follow up at:  Follow-up Information    Follow up with [facility]. On 04/30/2012. (Appointment scheduled for Friday, December 6 at 8 AM)    Contact information:   [address] [place], [place] [address] [phone]         Patient denies SI/HI:   Yes,       Safety Planning and Suicide Prevention discussed:  Yes,     Barrier to discharge identified:Yes,     Summary and Recommendations: Patient will discharge to his mother's house with followup scheduled at [facility].   Patton Salles 04/23/2012, 10:24 AM
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
## 2012-04-23 NOTE — Discharge Summary (Signed)
Physician Discharge Summary Note  Patient:  [name] is an 51 y.o., male MRN:  [id number] DOB:  [birth year] Patient phone:  [phone] (home)  Patient address:   [address] [place] [address],   Date of Admission:  04/21/2012 Date of Discharge: 04/23/12  Reason for Admission: Suicidal ideations and auditory hallucinations.  Discharge Diagnoses: Bipolar disorder, current episode manic severe with psychotic features   Axis Diagnosis:   AXIS I:  Bipolar disorder, current episode manic severe with psychotic features AXIS II:  Deferred AXIS III:   Past Medical History  Diagnosis Date  . Depression   . Arthritis   . Bipolar 1 disorder    AXIS IV:  other psychosocial or environmental problems AXIS V:  65  Level of Care:  OP  Hospital Course: [name] is an 51 y.o. male who presented as a walk-in complaining of suicidal ideation with a plan to cut himself. He also says he is hearing voices telling him to cut. He has a history of substance abuse but says he has been clean for one year. He has been admitted to this facility multiple times for similar symptoms. He recently relocated from [place], [place] and has run out of his medication. He had been taking Lamictal, Vistaril, and Ambien but he cannot remember the dosages. He also says that the three year anniversary of his grandmother's death is coming up.  He reports being very manic most recently, talking loudly and rapidly. Has a long history of bipolar disorder and being noncompliant with his medications. States he likes to be manic mostly, does not like when he gets depressed.   While a patient in this hospital, [name] was re-started on his medication regimen for the management of his manic depressive episodes with psychotic symptoms. He was prescribed and received Risperdal 0.5 bid mg for mood control, Lamictal 25 bid mg for mood stabilization and Trazodone 50 mg Q bedtime for sleep. He also was enrolled in group  counseling sessions and activities to learn coping skills that will help him cope better with his symptoms after discharge. He also received medication management and monitoring for his other medical issues and concerns such as arthritic pain to left knee. He tolerated his treatment regimen without any significant adverse effects and or reactions presented.   Patient did respond positively to his treatment regimen. This is evidenced by his daily reports of improved mood, reduction of symptoms and presentation of good affect. He attended treatment team meeting this am and met with his treatment team members. His reason for admission, symptoms, response to treatment and discharge plans discussed with patient. [name] endorsed that his symptoms has stabilized and that he is currently at his baseline.  He added that he is ready to be discharged to his home. It was agreed upon that patient will follow-up psychiatric care at [facility] on 04/30/12 @ 08:00 am. He is instructed and made aware that this is also a walk-in appointment.  Upon discharge, [name] adamantly denies any suicidal, homicidal ideations, auditory, visual hallucinations and or delusional thinking. He was provided with 14 days worth supply samples of his [facility] discharge medications. He left [facility] with all personal belongings via personal arranged transport in no apparent distress.  Consults:  None  Significant Diagnostic Studies:  labs: Urinalysis  Discharge Vitals:   Blood pressure 123/86, pulse 87, temperature 97.8 F (36.6 C), temperature source Oral, resp. rate 20, height 6\' 2"  (1.88 m), weight 112.492 kg (248 lb). Lab Results:  Results for orders placed during the hospital encounter of 04/21/12 (from the past 72 hour(s))  URINALYSIS, ROUTINE W REFLEX MICROSCOPIC     Status: Abnormal   Collection Time   04/21/12  6:49 AM      Component Value Range Comment   Color, Urine YELLOW  YELLOW    APPearance CLEAR  CLEAR    Specific Gravity,  Urine 1.023  1.005 - 1.030    pH 6.0  5.0 - 8.0    Glucose, UA NEGATIVE  NEGATIVE mg/dL    Hgb urine dipstick SMALL (*) NEGATIVE    Bilirubin Urine NEGATIVE  NEGATIVE    Ketones, ur NEGATIVE  NEGATIVE mg/dL    Protein, ur NEGATIVE  NEGATIVE mg/dL    Urobilinogen, UA 0.2  0.0 - 1.0 mg/dL    Nitrite NEGATIVE  NEGATIVE    Leukocytes, UA LARGE (*) NEGATIVE   URINE MICROSCOPIC-ADD ON     Status: Normal   Collection Time   04/21/12  6:49 AM      Component Value Range Comment   Squamous Epithelial / LPF RARE  RARE    WBC, UA 7-10  <3 WBC/hpf    RBC / HPF 3-6  <3 RBC/hpf    Bacteria, UA RARE  RARE    Urine-Other MUCOUS PRESENT   AMORPHOUS URATES/PHOSPHATES    Physical Findings: AIMS: Facial and Oral Movements Muscles of Facial Expression: None, normal Lips and Perioral Area: None, normal Jaw: None, normal Tongue: None, normal,Extremity Movements Upper (arms, wrists, hands, fingers): None, normal Lower (legs, knees, ankles, toes): None, normal, Trunk Movements Neck, shoulders, hips: None, normal, Overall Severity Severity of abnormal movements (highest score from questions above): None, normal Incapacitation due to abnormal movements: None, normal Patient's awareness of abnormal movements (rate only patient's report): No Awareness, Dental Status Current problems with teeth and/or dentures?: Yes Does patient usually wear dentures?: Yes  CIWA:  CIWA-Ar Total: 0  COWS:  COWS Total Score: 0   Mental Status Exam: See Mental Status Examination and Suicide Risk Assessment completed by Attending Physician prior to discharge.  Discharge destination:  Home  Is patient on multiple antipsychotic therapies at discharge:  No   Has Patient had three or more failed trials of antipsychotic monotherapy by history:  No  Recommended Plan for Multiple Antipsychotic Therapies: NA     Medication List     As of 04/23/2012 10:18 AM    STOP taking these medications         diphenhydrAMINE 25 mg  capsule   Commonly known as: BENADRYL      TAKE these medications      Indication    hydrOXYzine 50 MG tablet   Commonly known as: ATARAX/VISTARIL   Take 2 tablets (100 mg total) by mouth at bedtime. For anxiety symptoms.       lamoTRIgine 25 MG tablet   Commonly known as: LAMICTAL   Take 1 tablet (25 mg total) by mouth 2 (two) times daily. For mood stabilization       risperiDONE 0.5 MG tablet   Commonly known as: RISPERDAL   Take 1 tablet (0.5 mg total) by mouth 2 (two) times daily. For mood control       traZODone 50 MG tablet   Commonly known as: DESYREL   Take 1 tablet (50 mg total) by mouth at bedtime and may repeat dose one time if needed. For depression/sleep            Follow-up Information  Follow up with [facility]. On 04/30/2012. (Appointment scheduled for Friday, December 6 at 8 AM)    Contact information:   [address] [place], [place] [address] [phone]         Follow-up recommendations:  Activity:  as tolerated Other:  Keep all scheduled follow-up appointments as recommended.    Comments:  Take all your medications as prescribed by your mental healthcare provider. Report any adverse effects and or reactions from your medicines to your outpatient provider promptly. Patient is instructed and cautioned to not engage in alcohol and or illegal drug use while on prescription medicines. In the event of worsening symptoms, patient is instructed to call the crisis hotline, 911 and or go to the nearest ED for appropriate evaluation and treatment of symptoms. Follow-up with your primary care provider for your other medical issues, concerns and or health care needs.     SignedArmandina Stammer I 04/23/2012, 10:18 AM

## 2012-04-23 NOTE — BHH Suicide Risk Assessment (Signed)
Suicide Risk Assessment  Discharge Assessment     Demographic Factors:  Male, Low socioeconomic status and Living alone  Mental Status Per Nursing Assessment::   On Admission:  Suicidal ideation indicated by patient;Suicide plan;Self-harm thoughts  Current Mental Status by Physician: NA  Loss Factors: Decrease in vocational status and Patient was noncompliant with medication  Historical Factors: Prior suicide attempts and Impulsivity  Risk Reduction Factors:   Positive social support, Positive therapeutic relationship and Positive coping skills or problem solving skills  Continued Clinical Symptoms:  Bipolar Disorder:   Mixed State  Cognitive Features That Contribute To Risk:  Polarized thinking    Suicide Risk:  Minimal: No identifiable suicidal ideation.  Patients presenting with no risk factors but with morbid ruminations; may be classified as minimal risk based on the severity of the depressive symptoms  Discharge Diagnoses:   AXIS I:  Bipolar, Depressed and Bipolar, mixed AXIS II:  Deferred AXIS III:   Past Medical History  Diagnosis Date  . Depression   . Arthritis   . Bipolar 1 disorder    AXIS IV:  economic problems, other psychosocial or environmental problems and problems with access to health care services AXIS V:  61-70 mild symptoms  Plan Of Care/Follow-up recommendations:  Activity:  Normal patient will followup with his psychiatrist.  Please see complete discharge note.  Is patient on multiple antipsychotic therapies at discharge:  No   Has Patient had three or more failed trials of antipsychotic monotherapy by history:  No  Recommended Plan for Multiple Antipsychotic Therapies: Taper to monotherapy as described:  Patient will discuss his medication with his psychiatrist.  ARFEEN,SYED T. 04/23/2012, 10:32 AM

## 2012-04-25 ENCOUNTER — Encounter (HOSPITAL_COMMUNITY): Payer: Self-pay | Admitting: *Deleted

## 2012-04-25 ENCOUNTER — Emergency Department (HOSPITAL_COMMUNITY)
Admission: EM | Admit: 2012-04-25 | Discharge: 2012-04-25 | Disposition: A | Discharge status: Other Acute Inpatient Hospital | Payer: Self-pay | Attending: Emergency Medicine | Admitting: Emergency Medicine

## 2012-04-25 ENCOUNTER — Inpatient Hospital Stay (HOSPITAL_COMMUNITY)
Admission: AD | Admit: 2012-04-25 | Discharge: 2012-05-07 | DRG: 885 | Disposition: A | Discharge status: Home / Self Care | Payer: No Typology Code available for payment source | Source: Other Acute Inpatient Hospital | Attending: Psychiatry | Admitting: Psychiatry

## 2012-04-25 DIAGNOSIS — M2550 Pain in unspecified joint: Secondary | ICD-10-CM | POA: Insufficient documentation

## 2012-04-25 DIAGNOSIS — F1010 Alcohol abuse, uncomplicated: Secondary | ICD-10-CM | POA: Insufficient documentation

## 2012-04-25 DIAGNOSIS — F3289 Other specified depressive episodes: Secondary | ICD-10-CM | POA: Insufficient documentation

## 2012-04-25 DIAGNOSIS — F312 Bipolar disorder, current episode manic severe with psychotic features: Secondary | ICD-10-CM

## 2012-04-25 DIAGNOSIS — F32A Depression, unspecified: Secondary | ICD-10-CM

## 2012-04-25 DIAGNOSIS — Z79899 Other long term (current) drug therapy: Secondary | ICD-10-CM

## 2012-04-25 DIAGNOSIS — R45851 Suicidal ideations: Secondary | ICD-10-CM

## 2012-04-25 DIAGNOSIS — F1910 Other psychoactive substance abuse, uncomplicated: Secondary | ICD-10-CM

## 2012-04-25 DIAGNOSIS — M129 Arthropathy, unspecified: Secondary | ICD-10-CM | POA: Insufficient documentation

## 2012-04-25 DIAGNOSIS — F1410 Cocaine abuse, uncomplicated: Secondary | ICD-10-CM | POA: Insufficient documentation

## 2012-04-25 DIAGNOSIS — F329 Major depressive disorder, single episode, unspecified: Secondary | ICD-10-CM | POA: Insufficient documentation

## 2012-04-25 DIAGNOSIS — F319 Bipolar disorder, unspecified: Secondary | ICD-10-CM | POA: Insufficient documentation

## 2012-04-25 DIAGNOSIS — F3160 Bipolar disorder, current episode mixed, unspecified: Principal | ICD-10-CM | POA: Diagnosis present

## 2012-04-25 LAB — POCT I-STAT, CHEM 8
BUN: 22 mg/dL (ref 6–23)
Chloride: 105 mEq/L (ref 96–112)
Creatinine, Ser: 1.2 mg/dL (ref 0.50–1.35)
Glucose, Bld: 93 mg/dL (ref 70–99)
Hemoglobin: 16.3 g/dL (ref 13.0–17.0)
Potassium: 3.8 mEq/L (ref 3.5–5.1)
Sodium: 142 mEq/L (ref 135–145)

## 2012-04-25 LAB — RAPID URINE DRUG SCREEN, HOSP PERFORMED: Opiates: NOT DETECTED

## 2012-04-25 LAB — ETHANOL: Alcohol, Ethyl (B): <11 mg/dL (ref 0–11)

## 2012-04-25 MED ORDER — LAMOTRIGINE 25 MG PO TABS
25.0000 mg | ORAL_TABLET | Freq: Two times a day (BID) | ORAL | Status: DC
  Administered 2012-04-25 – 2012-04-27 (×4): 25 mg via ORAL
  Filled 2012-04-25 (×10): qty 1

## 2012-04-25 MED ORDER — ACETAMINOPHEN 325 MG PO TABS
650.0000 mg | ORAL_TABLET | Freq: Four times a day (QID) | ORAL | Status: DC | PRN
  Administered 2012-04-25 – 2012-04-26 (×2): 650 mg via ORAL

## 2012-04-25 MED ORDER — IBUPROFEN 200 MG PO TABS: 600.0000 mg | ORAL_TABLET | Freq: Three times a day (TID) | ORAL | Status: DC | PRN

## 2012-04-25 MED ORDER — MAGNESIUM HYDROXIDE 400 MG/5ML PO SUSP: 30.0000 mL | Freq: Every day | ORAL | Status: DC | PRN

## 2012-04-25 MED ORDER — ACETAMINOPHEN 325 MG PO TABS: 650.0000 mg | ORAL_TABLET | ORAL | Status: DC | PRN

## 2012-04-25 MED ORDER — RISPERIDONE 0.5 MG PO TABS
0.5000 mg | ORAL_TABLET | Freq: Two times a day (BID) | ORAL | Status: DC
  Administered 2012-04-25 – 2012-04-27 (×4): 0.5 mg via ORAL
  Filled 2012-04-25 (×10): qty 1

## 2012-04-25 MED ORDER — RISPERIDONE 0.5 MG PO TBDP
ORAL_TABLET | ORAL | Status: AC
  Filled 2012-04-25: qty 1

## 2012-04-25 MED ORDER — HYDROXYZINE HCL 25 MG PO TABS
25.0000 mg | ORAL_TABLET | Freq: Every evening | ORAL | Status: DC | PRN
  Administered 2012-04-26: 25 mg via ORAL
  Filled 2012-04-25 (×7): qty 1

## 2012-04-25 MED ORDER — LORAZEPAM 1 MG PO TABS: 1.0000 mg | ORAL_TABLET | Freq: Three times a day (TID) | ORAL | Status: DC | PRN

## 2012-04-25 MED ORDER — TRAZODONE HCL 50 MG PO TABS
50.0000 mg | ORAL_TABLET | Freq: Every evening | ORAL | Status: DC | PRN
  Filled 2012-04-25 (×18): qty 1
  Filled 2012-04-25: qty 2
  Filled 2012-04-25 (×2): qty 1

## 2012-04-25 MED ORDER — ALUM & MAG HYDROXIDE-SIMETH 200-200-20 MG/5ML PO SUSP: 30.0000 mL | ORAL | Status: DC | PRN

## 2012-04-25 NOTE — ED Notes (Signed)
Pt given water per RNs approval 

## 2012-04-25 NOTE — ED Provider Notes (Signed)
History     CSN: [id number]  Arrival date & time 04/25/12  4098   First MD Initiated Contact with Patient 04/25/12 [phone]      Chief Complaint  Patient presents with  . Suicidal    (Consider location/radiation/quality/duration/timing/severity/associated sxs/prior treatment) HPI Comments: Patient presents with depression and thoughts of suicide. He states he has a history of bipolar disorder and he's been out of his medications including Vistaril and Respirdal for about 2 weeks now. Over the last few days she's having worsening thoughts of suicide. He feels like he might cut himself or run out in front of a car. He also admits to alcohol use. He states he has binges and he's been drinking for last 2-3 days. He does admit to either cocaine or marijuana use, he can't remember which. Denies any physical complaints other than his joints ache from his arthritis.   Past Medical History  Diagnosis Date  . Depression   . Arthritis   . Bipolar 1 disorder     History reviewed. No pertinent past surgical history.  History reviewed. No pertinent family history.  History  Substance Use Topics  . Smoking status: Never Smoker   . Smokeless tobacco: Not on file  . Alcohol Use: No      Review of Systems  Constitutional: Negative for fever, chills, diaphoresis and fatigue.  HENT: Negative for congestion, rhinorrhea and sneezing.   Eyes: Negative.   Respiratory: Negative for cough, chest tightness and shortness of breath.   Cardiovascular: Negative for chest pain and leg swelling.  Gastrointestinal: Negative for nausea, vomiting, abdominal pain, diarrhea and blood in stool.  Genitourinary: Negative for frequency, hematuria, flank pain and difficulty urinating.  Musculoskeletal: Positive for arthralgias. Negative for back pain.  Skin: Negative for rash.  Neurological: Negative for dizziness, speech difficulty, weakness, numbness and headaches.  Psychiatric/Behavioral: Positive for suicidal  ideas.    Allergies  Review of patient's allergies indicates no known allergies.  Home Medications   Current Outpatient Rx  Name  Route  Sig  Dispense  Refill  . HYDROXYZINE HCL 50 MG PO TABS   Oral   Take 2 tablets (100 mg total) by mouth at bedtime. For anxiety symptoms.   30 tablet   0   . IBUPROFEN 200 MG PO TABS   Oral   Take 400 mg by mouth every 6 (six) hours as needed. For pain         . LAMOTRIGINE 25 MG PO TABS   Oral   Take 1 tablet (25 mg total) by mouth 2 (two) times daily. For mood stabilization   60 tablet   0   . RISPERIDONE 0.5 MG PO TABS   Oral   Take 1 tablet (0.5 mg total) by mouth 2 (two) times daily. For mood control   30 tablet   0   . TRAZODONE HCL 50 MG PO TABS   Oral   Take 1 tablet (50 mg total) by mouth at bedtime and may repeat dose one time if needed. For depression/sleep   60 tablet   0     BP 114/67  Pulse 108  Temp 97.9 F (36.6 C) (Oral)  Resp 20  SpO2 97%  Physical Exam  Constitutional: He is oriented to person, place, and time. He appears well-developed and well-nourished.  HENT:  Head: Normocephalic and atraumatic.  Eyes: Pupils are equal, round, and reactive to light.  Neck: Normal range of motion. Neck supple.  Cardiovascular: Normal rate, regular rhythm  and normal heart sounds.   Pulmonary/Chest: Effort normal and breath sounds normal. No respiratory distress. He has no wheezes. He has no rales. He exhibits no tenderness.  Abdominal: Soft. Bowel sounds are normal. There is no tenderness. There is no rebound and no guarding.  Musculoskeletal: Normal range of motion. He exhibits no edema.  Lymphadenopathy:    He has no cervical adenopathy.  Neurological: He is alert and oriented to person, place, and time.  Skin: Skin is warm and dry. No rash noted.  Psychiatric: He has a normal mood and affect. His behavior is normal. Thought content normal.    ED Course  Procedures (including critical care time)  Results for  orders placed during the hospital encounter of 04/25/12  ETHANOL      Component Value Range   Alcohol, Ethyl (B) <11  0 - 11 mg/dL  URINE RAPID DRUG SCREEN (HOSP PERFORMED)      Component Value Range   Opiates NONE DETECTED  NONE DETECTED   Cocaine POSITIVE (*) NONE DETECTED   Benzodiazepines NONE DETECTED  NONE DETECTED   Amphetamines NONE DETECTED  NONE DETECTED   Tetrahydrocannabinol NONE DETECTED  NONE DETECTED   Barbiturates NONE DETECTED  NONE DETECTED  POCT I-STAT, CHEM 8      Component Value Range   Sodium 142  135 - 145 mEq/L   Potassium 3.8  3.5 - 5.1 mEq/L   Chloride 105  96 - 112 mEq/L   BUN 22  6 - 23 mg/dL   Creatinine, Ser 0.45  0.50 - 1.35 mg/dL   Glucose, Bld 93  70 - 99 mg/dL   Calcium, Ion 4.09  8.11 - 1.23 mmol/L   TCO2 25  0 - 100 mmol/L   Hemoglobin 16.3  13.0 - 17.0 g/dL   HCT 91.4  78.2 - 95.6 %   No results found.     1. Depression   2. Substance abuse       MDM  Patient with suicidal ideations. We'll consult ACT team for assessment and move pt to Pod C for further treatment.        Rolan Bucco, MD 04/25/12 1146

## 2012-04-25 NOTE — BHH Counselor (Signed)
Pt accepted to [facility] to Dr. Daleen Bo [phone]; Authorized for 7 days with [place] spoke with [place] --[phone]

## 2012-04-25 NOTE — Progress Notes (Signed)
Pt admitted on voluntary basis, pt presents as depressed and suicidal and thoughts of jumping in traffic. Pt was recently discharged from this facility a couple days ago and stated that he left too soon. Pt spoke about being in [place] the past year and came back to [place] to spend holidays with his family, pt did state that he used drugs over the weekend and was also drinking. Pt did endorse he was having suicidal thoughts and was depressed but was able to contract for safety on the unit, pt was oriented to the unit and safety maintained.

## 2012-04-25 NOTE — ED Notes (Signed)
Pt reports being off psych meds x 2 weeks, having suicidal thoughts of cutting himself but has not made any attempts. Pt calm and cooperative at triage.
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
## 2012-04-26 NOTE — Social Work (Signed)
Aftercare Planning Group: 04/26/2012 9:45 AM  Pt attended discharge planning group and actively participated in group.  CSW provided pt with today's workbook.  Pt presents with  depressed mood rating himself at 10 for depression, hopelessness, helplessness, anxiety, and suicidal ideations. Patient admits to going to a motel room straight from hospital discharge last Friday when he began drinking and using cocaine. Patient stated he couldn't face his family but did not have a solid answer as to why he did not go to his family home at discharge this is going to a motel room. Patient now believes he needs residential treatment.  [facility] Group Note : Clinical Social Worker Group Therapy  04/26/2012  1:15 PM  Type of Therapy:  Group Therapy - Process Group  Patient did not attend group

## 2012-04-26 NOTE — Progress Notes (Signed)
Psychoeducational Group Note  Date:  04/26/2012 Time: 2000  Group Topic/Focus:    Wrap-Up Group:   The focus of this group is to help patients review their daily goal of treatment and discuss progress on daily workbooks.  Participation Level: Did Not Attend  Participation Quality:  Not Applicable  Affect:  Not Applicable  Cognitive:  Not Applicable  Insight:  Not Applicable  Engagement in Group: Not Applicable  Additional Comments:  The patient was admitted to the hallway after the conclusion of group.   Hazle Coca S 04/26/2012, 12:41 AM

## 2012-04-26 NOTE — Progress Notes (Signed)
Psychoeducational Group Note  Date:  04/26/2012 Time: 1100  Group Topic/Focus:  Self Care:   The focus of this group is to help patients understand the importance of self-care in order to improve or restore emotional, physical, spiritual, interpersonal, and financial health.  Participation Level:  Did Not Attend   Meredith Staggers 04/26/2012, 2:21 PM
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
## 2012-04-27 DIAGNOSIS — F312 Bipolar disorder, current episode manic severe with psychotic features: Secondary | ICD-10-CM

## 2012-04-27 MED ORDER — HYDROXYZINE HCL 50 MG PO TABS
50.0000 mg | ORAL_TABLET | Freq: Three times a day (TID) | ORAL | Status: DC | PRN
  Administered 2012-04-27 – 2012-05-01 (×8): 50 mg via ORAL
  Filled 2012-04-27 (×2): qty 1

## 2012-04-27 MED ORDER — RISPERIDONE 1 MG PO TABS
1.0000 mg | ORAL_TABLET | Freq: Two times a day (BID) | ORAL | Status: DC
  Administered 2012-04-27 – 2012-05-07 (×20): 1 mg via ORAL
  Filled 2012-04-27: qty 28
  Filled 2012-04-27 (×9): qty 1
  Filled 2012-04-27: qty 28
  Filled 2012-04-27 (×11): qty 1
  Filled 2012-04-27 (×2): qty 28
  Filled 2012-04-27 (×2): qty 1

## 2012-04-27 MED ORDER — LAMOTRIGINE 25 MG PO TABS
50.0000 mg | ORAL_TABLET | Freq: Two times a day (BID) | ORAL | Status: DC
  Administered 2012-04-27 – 2012-05-03 (×12): 50 mg via ORAL
  Filled 2012-04-27 (×14): qty 2

## 2012-04-27 MED ORDER — IBUPROFEN 600 MG PO TABS
600.0000 mg | ORAL_TABLET | Freq: Four times a day (QID) | ORAL | Status: DC | PRN
  Administered 2012-04-29 – 2012-05-07 (×13): 600 mg via ORAL
  Filled 2012-04-27 (×13): qty 1

## 2012-04-27 NOTE — Progress Notes (Signed)
Psychoeducational Group Note  Date:  04/27/2012 Time:  1100  Group Topic/Focus:  Recovery Goals:   The focus of this group is to identify appropriate goals for recovery and establish a plan to achieve them.  Participation Level:  Did Not Attend    Meredith Staggers 04/27/2012, 1:09 PM
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
## 2012-04-27 NOTE — Progress Notes (Signed)
Patient Discharge Instructions:  After Visit Summary (AVS):   Faxed to:  04/27/12 Psychiatric Admission Assessment Note:   Faxed to:  04/27/12 Suicide Risk Assessment - Discharge Assessment:   Faxed to:  04/27/12 Faxed/Sent to the Next Level Care provider:  04/27/12 Faxed to [facility] @ [phone]  Jerelene Redden, 04/27/2012, 3:39 PM

## 2012-04-27 NOTE — Progress Notes (Signed)
D:  Patient's self inventory form, patient sleeps well, has poor appetite, low energy level, poor attention span.  Rated depression, hopelessness, anxiety #10.  Denied withdrawals.  SI, contracts for safety.  Has arthritis in right leg.  Zero pain goal.  Worst pain #10.  Does not know how to better care for himself after discharge.  Stated he has no energy, wants to talk to MD.  Does not want to attend groups, "they make me mad."  No discharge plans.  No problems taking medications after discharge. A:  Medications administered per MD order.  Support and encouragement given throughout day.  Support and safety checks completed as ordered. R:  Denied HI.  Denied visual hallucinations.  SI, contracts for safety.  Has heard mumblings in the past.   Denied hearing voices today.  Patient remains safe on unit. Patient would not get out of bed this morning to go to discharge planning group.  Patient rested in bed this morning.  Encouraged patient to get up and participate, patient refused.
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
## 2012-04-27 NOTE — Progress Notes (Signed)
Patient ID: [name], male   DOB: 1961-01-25, 51 y.o.   MRN: [id number] Pt visible in the milieu.  Pt with minimal but appropriate interaction with staff and peers.  Attending groups.  Pt states he is hearing voices telling him he has nothing to live for.  Verbalizes passive SI but contracts for safety.   Needs assessed.  Pt denies.  Fifteen minute checks in progress.  Pt remains safe on unit.

## 2012-04-27 NOTE — Social Work (Signed)
Aftercare Planning Group: 04/27/2012 9:45 AM Patient did not attend after care planning group  [facility] Group Note : Clinical Social Worker Group Therapy  04/27/2012  1:15 PM  Type of Therapy:  Group Therapy - Process Group  Participation Level: None  Participation Quality:  None  Affect:  Blunted and Lethargic  Cognitive:  Alert  Insight:  Lacking  Engagement in Group:  Limited  Engagement in Therapy:  Limited  Modes of Intervention:  Clarification, Education, Exploration and Support  Summary of Progress/Problems: The focus of group was on exploring feelings about diagnosis. Patient sat in group for a while made a comment about being responsible, and then left group and did not return.   Patton Salles LCSW 04/27/2012 3:00 pm

## 2012-04-28 NOTE — Progress Notes (Signed)
D:  [name] reports that he continues to feel depressed, but feels like he needs time for his medications to start working.  He attended evening group.  He denies AVH/HI, but does state that he has some suicidal thoughts at times.  He does contract for safety.  He states that he feels tired. A:  Medications given as ordered.  Emotional support provided, safety checks q 15 minutes. R:  Safety maintained on unit.

## 2012-04-28 NOTE — Progress Notes (Signed)
[facility] Group Notes:  (Counselor/Nursing/MHT/Case Management/Adjunct)  04/28/2012 2:59 PM  Type of Therapy:  Psychoeducational Skills  Participation Level:  Did Not Attend  Summary of Progress/Problems: "[name]" refused to attend psychoeducation group that focused on using quality time with support systems/individuals to engage in healthy coping skills.    Wandra Scot 04/28/2012, 2:59 PM

## 2012-04-28 NOTE — Social Work (Signed)
Interdisciplinary Treatment Plan Update (Adult)  Date:  04/28/2012  Time Reviewed:  6:50 AM    Progress in Treatment: Attending groups:  No   Participating in groups:  No Taking medication as prescribed:  Yes Tolerating medication:  Yes Family/Significant othe contact made: Patient refuses contact Patient understands diagnosis:  Yes Discussing patient identified problems/goals with staff: No Medical problems stabilized or resolved: Yes Denies suicidal/homicidal ideation: No -  Patient able to contract for safety Issues/concerns per patient self-inventory: Homelessness Other:  New problem(s) identified: Homelessness  Reason for Continuation of Hospitalization: Anxiety Depression Medication stabilization Suicidal ideation  Interventions implemented related to continuation of hospitalization:  Medication mgement; safety checks q 15 mins; coping skills development  Additional comments: Patient refuses to attend groups and reports to clinical staff that he likes being manic. Patient shows no motivation to get better and appears very depressed. Patient takes limited responsibility for his behavior and does show some awareness as to what he has put his family through with his inconsistent treatment of his mental illness. Risperdal has been raised to 1 mg in the middle has been raised from 25-50 mg. We'll talk to patient about potential placement at [facility]  Estimated length of stay: 3-5 days  Discharge Plan:  Outpatient follow up to be scheduled  New goal(s):  Review of initial/current patient goals per problem list:    1.  Goal(s): Eliminate SI/other thoughts of self harm   Met:  No  Target date: d/c  As evidenced by: Patient will no longer endorse SI/HI or other thoughts of self harm.    2.  Goal (s):Reduce depression/anxiety  Met: No  Target date: d/c  As evidenced by: Patient will rate symptoms at four or below    3.  Goal(s):.stabilize on meds   Met:   No  Target date: d/c  As evidenced by: Patient will report being stabilized on medications - less symptomatic    4.  Goal(s): Refer for outpatient follow up   Met:  No  Target date: d/c  As evidenced by: Follow up appointment will be scheduled    Attendees: Patient:   @TD  6:50 AM  Physican:  Patrick North, MD @TD  6:50 AM  Nursing:  Berneice Heinrich, RN  04/28/2012 6:50 AM  Nursing:    @TD  6:50 AM  Clinical Social Worker:  Patton Salles, LCSW @TD  6:50 AM  Other: Joanell Rising 04/28/2012 6:50 AM   Other: Buford Dresser        04/28/2012 6:50 AM Other:

## 2012-04-28 NOTE — Progress Notes (Signed)
D: Patient denies SI/HI and auditory and visual hallucinations. The patient has a depressed mood and affect. The patient refuses to fill out his self-inventory form. The patient is having minimal interaction within the milieu. The patient also prefers to stay in his room and doesn't want to attend groups.  A: Patient given emotional support from RN. Patient encouraged to come to staff with concerns and/or questions. Patient's medication routine continued. Patient's orders and plan of care reviewed.  R: Patient remains appropriate and cooperative. Will continue to monitor patient q15 minutes for safety.

## 2012-04-28 NOTE — Social Work (Signed)
  [facility] Group Notes:  (Counselor/Nursing/MHT/Case Management/Adjunct)  04/28/2012  8:30 AM  Type of Therapy:  Discharge Planning  Participation Level:  Patient did not attend group  [facility] Group Notes:  (Counselor/Nursing/MHT/Case Management/Adjunct)  04/28/2012  1:15 PM  Type of Therapy:  Group Therapy  Participation Level:  Minimal  Participation Quality:  Attentive and Attentive when in group but left group several times.  Affect:  Anxious and Blunted  Cognitive:  Appropriate  Insight:  Limited  Engagement in Group:  Limited  Engagement in Therapy:  Limited  Modes of Intervention:  Discussion, Education and Problem-solving  Summary of Progress/Problems: Patient participated minimally in group focused on emotional regulation. Patient says that pushes his buttons is feeling hopeless and feeling rejected by his family. Patient takes little to no responsibility for his own actions and behaviors. Patient left and returned group several times improve this worker aside after group to continue to complete his case for residential placement.   Patton Salles LCSW 04/28/2012 6:49 AM

## 2012-04-28 NOTE — Progress Notes (Signed)
[facility] INPATIENT:  Family/Significant Other Suicide Prevention Education  Suicide Prevention Education:  Patient Refusal for Family/Significant Other Suicide Prevention Education: The patient [name] has refused to provide written consent for family/significant other to be provided Family/Significant Other Suicide Prevention Education during admission and/or prior to discharge.  Physician notified.  Patton Salles 04/28/2012, 12:18 PM

## 2012-04-28 NOTE — Progress Notes (Signed)
Patient ID: [name], male   DOB: 29-Jul-1960, 51 y.o.   MRN: [id number] [facility] MD Progress Note  04/28/2012 2:37 PM Grove Defina  MRN:  [id number]  S: "My mood is better today, but I feel depressed and suicidal still. I feel self here, but I don't know what I would do if I am discharged now. I talked to my mother today, she said that I can't come home right now. She wanted me to be right before I can come home.  She assured me that I have a home to come to, but my mind and mental state has to be right. I told her that I am working on it. It hurts me to know that I had put my mother through so much and she still cares about me. Lamictal takes time to work because it has to be taken slowly on a low dose any way. Like I say, I am working on getting better".  O: ROS: Large stature, Negative for fever.  HENT: Negative for congestion and rhinorrhea.  Respiratory: Negative for cough, chest tightness and shortness of breath.  Cardiovascular: Negative for chest pain.  Gastrointestinal: Negative for nausea, vomiting and abdominal pain.  Skin: Negative for rash.  Neurological: Negative for weakness and headaches . Psychiatric: Depressed mood, flat affect.  Diagnosis:   Axis I: Bipolar disorder, current episode manic severe with psychotic features Axis II: Deferred Axis III:  Past Medical History  Diagnosis Date  . Depression   . Arthritis   . Bipolar 1 disorder    Axis IV: housing problems, occupational problems, other psychosocial or environmental problems and substance abuse Axis V: 41-50 serious symptoms  ADL's:  Intact  Sleep: Good  Appetite:  Fair  Suicidal Ideation: "yes" Plan:  No Intent:  No Means:  no Homicidal Ideation: "No" Plan:  No Intent:  no Means:  no  AEB (as evidenced by): per patient's reports.  Mental Status Examination/Evaluation: Objective:  Appearance: Casual and Large stature  Eye Contact::  Poor  Speech:  Clear and Coherent  Volume:  Normal   Mood:  Depressed and Hopeless  Affect:  Congruent and Flat  Thought Process:  Circumstantial, Coherent and Intact  Orientation:  Full  Thought Content:  Rumination  Suicidal Thoughts:  Yes.  without intent/plan,   Homicidal Thoughts:  No  Memory:  Immediate;   Good Recent;   Good Remote;   Good  Judgement:  Fair  Insight:  Fair  Psychomotor Activity:  Normal  Concentration:  Fair  Recall:  Good  Akathisia:  No  Handed:  Right  AIMS (if indicated):     Assets:  Desire for Improvement  Sleep:  Number of Hours: 6.75    Vital Signs:Blood pressure 106/68, pulse 79, temperature 98.3 F (36.8 C), temperature source Oral, resp. rate 18, height 6\' 2"  (1.88 m), weight 112.946 kg (249 lb). Current Medications: Current Facility-Administered Medications  Medication Dose Route Frequency Provider Last Rate Last Dose  . acetaminophen (TYLENOL) tablet 650 mg  650 mg Oral Q6H PRN Shuvon Rankin, NP   650 mg at 04/26/12 0916  . alum & mag hydroxide-simeth (MAALOX/MYLANTA) 200-200-20 MG/5ML suspension 30 mL  30 mL Oral Q4H PRN Shuvon Rankin, NP      . hydrOXYzine (ATARAX/VISTARIL) tablet 50 mg  50 mg Oral TID PRN Sanjuana Kava, NP   50 mg at 04/28/12 1138  . ibuprofen (ADVIL,MOTRIN) tablet 600 mg  600 mg Oral Q6H PRN Sanjuana Kava, NP      .  lamoTRIgine (LAMICTAL) tablet 50 mg  50 mg Oral BID Sanjuana Kava, NP   50 mg at 04/28/12 0807  . magnesium hydroxide (MILK OF MAGNESIA) suspension 30 mL  30 mL Oral Daily PRN Shuvon Rankin, NP      . risperiDONE (RISPERDAL) tablet 1 mg  1 mg Oral BID Sanjuana Kava, NP   1 mg at 04/28/12 0806  . traZODone (DESYREL) tablet 50 mg  50 mg Oral QHS,MR X 1 Shuvon Rankin, NP        Lab Results: No results found for this or any previous visit (from the past 48 hour(s)).  Physical Findings: AIMS: Facial and Oral Movements Muscles of Facial Expression: None, normal Lips and Perioral Area: None, normal Jaw: None, normal Tongue: None, normal,Extremity  Movements Upper (arms, wrists, hands, fingers): None, normal Lower (legs, knees, ankles, toes): None, normal, Trunk Movements Neck, shoulders, hips: None, normal, Overall Severity Severity of abnormal movements (highest score from questions above): None, normal Incapacitation due to abnormal movements: None, normal Patient's awareness of abnormal movements (rate only patient's report): No Awareness, Dental Status Current problems with teeth and/or dentures?: Yes Does patient usually wear dentures?: Yes  CIWA:  CIWA-Ar Total: 0  COWS:  COWS Total Score: 0   Treatment Plan Summary: Daily contact with patient to assess and evaluate symptoms and progress in treatment Medication management  Plan: No changes made on the current treatment regimen. Continue current treatment plan.   [name] 04/28/2012, 2:37 PM

## 2012-04-29 NOTE — Progress Notes (Signed)
Psychoeducational Group Note  Date:  04/29/2012 Time:  2000  Group Topic/Focus:  Karaoke   Participation Level:  Active  Participation Quality:  Appropriate  Affect:  Appropriate and Excited  Cognitive:  Appropriate  Insight:  Engaged  Engagement in Group:  Engaged  Additional Comments:    Margrit Minner A 04/29/2012, 10:24 PM

## 2012-04-29 NOTE — Progress Notes (Signed)
Psychoeducational Group Note  Date:  04/28/2012 Time:  2015  Group Topic/Focus:  Wrap-Up Group:   The focus of this group is to help patients review their daily goal of treatment and discuss progress on daily workbooks.  Participation Level:  Active  Participation Quality:  Redirectable and Resistant  Affect:  Depressed  Cognitive:  Oriented  Insight:  Engaged  Engagement in Group:  Engaged  Additional Comments:  Patient shared that he was able to learn some good coping skills from one of his groups earlier today.  Marie Borowski, Newton Pigg 04/29/2012, 6:24 AM

## 2012-04-29 NOTE — Progress Notes (Signed)
D: Patient resting in bed with eyes closed.  Respirations even and unlabored.  Patient appears to be in no apparent distress. A: Staff to monitor Q 15 mins for safety.   R:Patient remains safe on the unit.  

## 2012-04-29 NOTE — Progress Notes (Signed)
D: Patient having passive SI but agrees to contract for safety. The patient denies HI and auditory and visual hallucinations. The patient reports sleeping poorly but states that his appetite is improving but that his energy level is low. The patient is having minimal interaction within the milieu and forwards little.   A: Patient given emotional support from RN. Patient encouraged to come to staff with concerns and/or questions. Patient's medication routine continued. Patient's orders and plan of care reviewed.  R: Patient remains appropriate and cooperative. Will continue to monitor patient q15 minutes for safety.

## 2012-04-29 NOTE — Social Work (Signed)
[facility] LCSW Aftercare Discharge Planning Group Note  04/29/2012  8:45 AM  Participation Quality:  Attentive, Drowsy and Comes in and out of group  Affect:  Blunted, Depressed and Flat  Cognitive:  Appropriate  Insight:  Limited  Engagement in Group:  Limited  Modes of Intervention:  Clarification, Discussion, Education and Support  Summary of Progress/Problems: Pt attended discharge planning group and actively participated in group.  CSW provided pt with today's workbook. Patient reports that he feels no better than he did yesterday and stated "I feel like blowing my brains out". Patient reports dosages of his medications were increased and complained of feeling very tired. Patient reports he is 10 across-the-board for depression, anxiety, helplessness, hopelessness and suicidal ideations (did contract for safety). Patient reported he'll change his mind about this worker talking to his mother and signed a consent giving this worker permission to speak with his mother.   [facility] LCSW Group Therapy  04/29/2012 1:15 PM  Type of Therapy:  Group Therapy  Participation Level:  None  Participation Quality:  Resistant  Affect:  Irritable  Cognitive:  Appropriate  Insight:  Resistant  Engagement in Therapy:  Resistant  Modes of Intervention:  Discussion, Education, Exploration and Socialization  Summary of Progress/Problems: Patient refused to participate in group session focused on creating balance in life. Patient would only say "I have nothing to say or I can't talk"   Perry Salles LCSW 04/29/2012 6:47 AM

## 2012-04-29 NOTE — Progress Notes (Signed)
Patient voices increased suicidal thoughts throughout the day but still contracts for safety. Patient states that he "wants to die" and that his family would be "better off without him." The patient states that his mother told him that she didn't want him to live with her anymore. RN offered support and a referral to case management for discharge/follow-up needs. RN encouraged patient to tell MD his thoughts and feelings about his current treatment. Will continue to monitor for safety.

## 2012-04-29 NOTE — Progress Notes (Signed)
Psychoeducational Group Note  Date:  04/29/2012 Time: 1100  Group Topic/Focus:  Overcoming Stress:   The focus of this group is to define stress and help patients assess their triggers.  Participation Level: Did Not Attend  Participation Quality:  Not Applicable  Affect:  Not Applicable  Cognitive:  Not Applicable  Insight:  Not Applicable  Engagement in Group: Not Applicable  Additional Comments:  Patient did not attend group. Patient remained in bed.  Karleen Hampshire Brittini 04/29/2012, 6:51 PM

## 2012-04-29 NOTE — Progress Notes (Signed)
Psychoeducational Group Note  Date:  04/29/2012 Time:  1000  Group Topic/Focus:  Making Healthy Choices:   The focus of this group is to help patients identify negative/unhealthy choices they were using prior to admission and identify positive/healthier coping strategies to replace them upon discharge.  Participation Level:  Minimal  Participation Quality:  Inattentive  Affect:  Appropriate  Cognitive:  Alert and Appropriate  Insight:  Limited  Engagement in Group:  Poor  Additional Comments:  Patient attended group but was inattentive for the majority of the time. The patient left group after 30 minutes.  Nestor Ramp [facility] 04/29/2012, 12:28 PM

## 2012-04-29 NOTE — Social Work (Signed)
Spoke with patient's mother per patient's request and written consent. Mother reports she cannot allow patient to come home saying he needs to be in an environment where he will take his medications and followup with his mental health treatment.

## 2012-04-29 NOTE — Progress Notes (Signed)
[facility] MD Progress Note  04/29/2012 12:19 PM [name]  MRN:  [id number] Subjective:  Patient reports his manic symptoms are better, feeling stable. Depressed mood continues and has suicidal thoughts of lying in front of a car. Voices telling him he is no good, hopeless. Diagnosis:   Axis I: Bipolar, mixed Axis II: Deferred Axis III:  Past Medical History  Diagnosis Date  . Depression   . Arthritis   . Bipolar 1 disorder    Axis IV: economic problems, housing problems and occupational problems Axis V: 51-60 moderate symptoms  ADL's:  Intact  Sleep: Fair  Appetite:  Fair   Psychiatric Specialty Exam: Review of Systems  Constitutional: Negative.   HENT: Negative.   Eyes: Negative.   Respiratory: Negative.   Cardiovascular: Negative.   Gastrointestinal: Negative.   Genitourinary: Negative.   Musculoskeletal: Negative.   Skin: Negative.   Neurological: Negative.   Endo/Heme/Allergies: Negative.   Psychiatric/Behavioral: Positive for depression, suicidal ideas and hallucinations. The patient is nervous/anxious.     Blood pressure 104/67, pulse 70, temperature 98.5 F (36.9 C), temperature source Oral, resp. rate 16, height 6\' 2"  (1.88 m), weight 112.946 kg (249 lb).Body mass index is 31.97 kg/(m^2).  General Appearance: Disheveled  Eye Solicitor::  Fair  Speech:  Clear and Coherent  Volume:  Decreased  Mood:  Depressed, Dysphoric and Hopeless  Affect:  Depressed and Flat  Thought Process:  Coherent  Orientation:  Full (Time, Place, and Person)  Thought Content:  Hallucinations: Auditory  Suicidal Thoughts:  Yes.  with intent/plan  Homicidal Thoughts:  No  Memory:  Immediate;   Fair Recent;   Fair Remote;   Fair  Judgement:  Impaired  Insight:  Present  Psychomotor Activity:  Decreased  Concentration:  Fair  Recall:  Fair  Akathisia:  No  Handed:  Right  AIMS (if indicated):     Assets:  Communication Skills Desire for Improvement Social Support  Sleep:   Number of Hours: 6.75    Current Medications: Current Facility-Administered Medications  Medication Dose Route Frequency Provider Last Rate Last Dose  . acetaminophen (TYLENOL) tablet 650 mg  650 mg Oral Q6H PRN Shuvon Rankin, NP   650 mg at 04/26/12 0916  . alum & mag hydroxide-simeth (MAALOX/MYLANTA) 200-200-20 MG/5ML suspension 30 mL  30 mL Oral Q4H PRN Shuvon Rankin, NP      . hydrOXYzine (ATARAX/VISTARIL) tablet 50 mg  50 mg Oral TID PRN Sanjuana Kava, NP   50 mg at 04/28/12 2040  . ibuprofen (ADVIL,MOTRIN) tablet 600 mg  600 mg Oral Q6H PRN Sanjuana Kava, NP      . lamoTRIgine (LAMICTAL) tablet 50 mg  50 mg Oral BID Sanjuana Kava, NP   50 mg at 04/29/12 0749  . magnesium hydroxide (MILK OF MAGNESIA) suspension 30 mL  30 mL Oral Daily PRN Shuvon Rankin, NP      . risperiDONE (RISPERDAL) tablet 1 mg  1 mg Oral BID Sanjuana Kava, NP   1 mg at 04/29/12 0749  . traZODone (DESYREL) tablet 50 mg  50 mg Oral QHS,MR X 1 Shuvon Rankin, NP        Lab Results: No results found for this or any previous visit (from the past 48 hour(s)).  Physical Findings: AIMS: Facial and Oral Movements Muscles of Facial Expression: None, normal Lips and Perioral Area: None, normal Jaw: None, normal Tongue: None, normal,Extremity Movements Upper (arms, wrists, hands, fingers): None, normal Lower (legs, knees, ankles, toes): None,  normal, Trunk Movements Neck, shoulders, hips: None, normal, Overall Severity Severity of abnormal movements (highest score from questions above): None, normal Incapacitation due to abnormal movements: None, normal Patient's awareness of abnormal movements (rate only patient's report): No Awareness, Dental Status Current problems with teeth and/or dentures?: Yes Does patient usually wear dentures?: Yes  CIWA:  CIWA-Ar Total: 0  COWS:  COWS Total Score: 0   Treatment Plan Summary: Daily contact with patient to assess and evaluate symptoms and progress in treatment Medication  management  Plan: Continue current plan of care and continue monitoring symptoms.  Medical Decision Making Problem Points:  Established problem, stable/improving (1), Review of last therapy session (1) and Review of psycho-social stressors (1) Data Points:  Review of medication regiment & side effects (2) Review of new medications or change in dosage (2)  I certify that inpatient services furnished can reasonably be expected to improve the patient's condition.   Jeni Duling 04/29/2012, 12:19 PM

## 2012-04-30 DIAGNOSIS — F3110 Bipolar disorder, current episode manic without psychotic features, unspecified: Secondary | ICD-10-CM

## 2012-04-30 NOTE — Progress Notes (Signed)
D: Patient having passive SI but denies any HI and auditory and visual hallucinations. The patient has a depressed mood and affect. The patient states that he feels "hopeless" today and that he feels like he wants to "give up on life because 'he' has nowhere to go." The patient is attending groups intermittently and is interacting appropriately within the milieu.  A: Patient given emotional support from RN. Patient encouraged to come to staff with concerns and/or questions. Patient's medication routine continued. Patient's orders and plan of care reviewed.  R: Patient remains appropriate and cooperative. Will continue to monitor patient q15 minutes for safety.

## 2012-04-30 NOTE — Social Work (Signed)
Interdisciplinary Treatment Plan Update (Adult)  Date:  04/30/2012  Time Reviewed:  6:50 AM    Progress in Treatment: Attending groups:  In and out of groups   Participating in groups:  Very little Taking medication as prescribed:  Yes Tolerating medication:  Yes Family/Significant othe contact made: Contact made with family and family is refusing to allow patient to return home. Patient understands diagnosis:  Yes Discussing patient identified problems/goals with staff: Yes Medical problems stabilized or resolved: Yes Denies suicidal/homicidal ideation: No -  Patient able to contract for safety Issues/concerns per patient self-inventory:  Other:  New problem(s) identified: None  Reason for Continuation of Hospitalization: Anxiety Depression Medication stabilization Suicidal ideation  Interventions implemented related to continuation of hospitalization:  Medication mgement; safety checks q 15 mins; coping skills development  Additional comments: Patient expresses remorse for upsetting his family with his relapse and suicidal ideations. Patient reported and this morning's group "I want to jump in front of a car again. I've never felt so suicidal in my life. I want to die and no one here can help me. Patient is currently homeless and clinical social worker will contact [facility] on day of discharge to assess for bed availability.  Estimated length of stay: 3-5 days  Discharge Plan:  Outpatient follow up to be scheduled with [facility] if patient does not go into residential treatment.  New goal(s):  Review of initial/current patient goals per problem list:    1.  Goal(s): Eliminate SI/other thoughts of self harm   Met:  No  Target date: d/c  As evidenced by: Patient will no longer endorse SI/HI or other thoughts of self harm.    2.  Goal (s):Reduce depression/anxiety  Met: No  Target date: d/c  As evidenced by: Patient will rate symptoms at four or below    3.   Goal(s):.stabilize on meds   Met:  No  Target date: d/c  As evidenced by: Patient will report being stabilized on medications - less symptomatic    4.  Goal(s): Refer for outpatient follow up   Met:  No  Target date: d/c  As evidenced by: Follow up appointment will be scheduled    Attendees: Patient:   @TD  6:50 AM  Physican:  Patrick North, MD @TD  6:50 AM  Nursing:  Berneice Heinrich, RN  04/30/2012 6:50 AM  Nursing:    @TD  6:50 AM  Clinical Social Worker:  Patton Salles, LCSW @TD  6:50 AM  Other: Joanell Rising 04/30/2012 6:50 AM   Other:         04/30/2012 6:50 AM Other:

## 2012-04-30 NOTE — Progress Notes (Signed)
D   Pt is calm and cooperative  He is pleasant on approach  He reports feeling depressed but denies suicidal ideation   He attends groups and is treatment compliant A   Verbal support given   Medications administered and effectiveness monitored  Q 15 min checks R   Pt safe at present
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
## 2012-04-30 NOTE — Progress Notes (Signed)
Patient ID: [name], male   DOB: [birth year], 51 y.o.   MRN: [id number] [facility] MD Progress Note  04/30/2012 11:09 AM [name]  MRN:  [id number] Subjective:  Patient reports his manic symptoms are better, feeling stable. Depressed mood continues and has suicidal thoughts of lying in front of a car. Voices telling him he is no good, hopeless. Diagnosis:   Axis I: Bipolar, mixed Axis II: Deferred Axis III:  Past Medical History  Diagnosis Date  . Depression   . Arthritis   . Bipolar 1 disorder    Axis IV: economic problems, housing problems and occupational problems Axis V: 51-60 moderate symptoms  ADL's:  Intact  Sleep: Poor Patient states that he wakes during the night and is unable to go back to sleep.  Appetite:  Poor Patient states that he has no appetite   Psychiatric Specialty Exam: Review of Systems  Constitutional: Negative.   HENT: Negative.   Eyes: Negative.   Respiratory: Negative.   Cardiovascular: Negative.   Gastrointestinal: Negative.   Genitourinary: Negative.   Musculoskeletal: Negative.   Skin: Negative.   Neurological: Negative.   Endo/Heme/Allergies: Negative.   Psychiatric/Behavioral: Positive for depression, suicidal ideas and hallucinations. The patient is nervous/anxious.     Blood pressure 122/78, pulse 80, temperature 98.1 F (36.7 C), temperature source Oral, resp. rate 18, height 6\' 2"  (1.88 m), weight 112.946 kg (249 lb).Body mass index is 31.97 kg/(m^2).  General Appearance: Casual  Eye Contact::  Fair  Speech:  Clear and Coherent  Volume:  Decreased  Mood:  Depressed, Dysphoric and Hopeless  Affect:  Depressed and Flat  Thought Process:  Coherent  Orientation:  Full (Time, Place, and Person)  Thought Content:  Hallucinations: Auditory  Denying   Suicidal Thoughts:  Yes.  with intent/plan  "Patient states that he only wants to be discharged so that he can kill him self.  States that he is tired and has deling with this every since  he was 63 and he is ready to die.  States that he is tired.    Homicidal Thoughts:  No  Memory:  Immediate;   Fair Recent;   Fair Remote;   Fair  Judgement:  Impaired  Insight:  Present  Psychomotor Activity:  Decreased  Concentration:  Fair  Recall:  Fair  Akathisia:  No  Handed:  Right  AIMS (if indicated):     Assets:  Communication Skills Desire for Improvement Social Support  Sleep:  Number of Hours: 6.25    Current Medications: Current Facility-Administered Medications  Medication Dose Route Frequency Provider Last Rate Last Dose  . acetaminophen (TYLENOL) tablet 650 mg  650 mg Oral Q6H PRN Shuvon Rankin, NP   650 mg at 04/26/12 0916  . alum & mag hydroxide-simeth (MAALOX/MYLANTA) 200-200-20 MG/5ML suspension 30 mL  30 mL Oral Q4H PRN Shuvon Rankin, NP      . hydrOXYzine (ATARAX/VISTARIL) tablet 50 mg  50 mg Oral TID PRN Sanjuana Kava, NP   50 mg at 04/29/12 2142  . ibuprofen (ADVIL,MOTRIN) tablet 600 mg  600 mg Oral Q6H PRN Sanjuana Kava, NP   600 mg at 04/29/12 2142  . lamoTRIgine (LAMICTAL) tablet 50 mg  50 mg Oral BID Sanjuana Kava, NP   50 mg at 04/30/12 0800  . magnesium hydroxide (MILK OF MAGNESIA) suspension 30 mL  30 mL Oral Daily PRN Shuvon Rankin, NP      . risperiDONE (RISPERDAL) tablet 1 mg  1 mg Oral BID  Sanjuana Kava, NP   1 mg at 04/30/12 0800  . traZODone (DESYREL) tablet 50 mg  50 mg Oral QHS,MR X 1 Shuvon Rankin, NP        Lab Results: No results found for this or any previous visit (from the past 48 hour(s)).  Physical Findings: AIMS: Facial and Oral Movements Muscles of Facial Expression: None, normal Lips and Perioral Area: None, normal Jaw: None, normal Tongue: None, normal,Extremity Movements Upper (arms, wrists, hands, fingers): None, normal Lower (legs, knees, ankles, toes): None, normal, Trunk Movements Neck, shoulders, hips: None, normal, Overall Severity Severity of abnormal movements (highest score from questions above): None,  normal Incapacitation due to abnormal movements: None, normal Patient's awareness of abnormal movements (rate only patient's report): No Awareness, Dental Status Current problems with teeth and/or dentures?: Yes Does patient usually wear dentures?: Yes  CIWA:  CIWA-Ar Total: 0  COWS:  COWS Total Score: 0   Treatment Plan Summary: Daily contact with patient to assess and evaluate symptoms and progress in treatment Medication management  Plan: Continue current plan of care and continue monitoring symptoms.  Medical Decision Making Problem Points:  Established problem, stable/improving (1), Review of last therapy session (1) and Review of psycho-social stressors (1) Data Points:  Review of medication regiment & side effects (2) Review of new medications or change in dosage (2)  I certify that inpatient services furnished can reasonably be expected to improve the patient's condition.   Rankin, Shuvon 04/30/2012, 11:09 AM

## 2012-05-01 MED ORDER — HYDROXYZINE HCL 50 MG PO TABS
50.0000 mg | ORAL_TABLET | Freq: Four times a day (QID) | ORAL | Status: DC | PRN
  Administered 2012-05-01 – 2012-05-05 (×7): 50 mg via ORAL
  Filled 2012-05-01: qty 1
  Filled 2012-05-01 (×2): qty 40

## 2012-05-01 NOTE — Clinical Social Work Note (Signed)
[facility] Group Notes: (Clinical Social Work)   05/01/2012   3-4pm   Type of Therapy:  Group Therapy   Participation Level:  Did Not Attend    Ambrose Mantle, LCSW 05/01/2012, 4:10 PM

## 2012-05-01 NOTE — Progress Notes (Signed)
[facility] MD Progress Note  05/01/2012 2:42 PM [name]  MRN:  [id number] Subjective:  This is lying in bed this afternoon and reports that he is experiencing significant amount of regret and remorse over his relapse on cocaine, and his subsequent decompensation in mood. He continues to endorse suicidal ideation with a plan to lie in traffic. He denies any homicidal ideation. He denies any true auditory or visual hallucinations, but does have a significant amount of negative and derogatory self talk. He continues to endorse anxiety and depressed mood. He states that his sleep is poor, and asked that he get have a higher dose of Vistaril at bedtime. His appetite is fair.  Diagnosis:   Axis I: Bipolar, mixed Axis II: Deferred Axis III:  Past Medical History  Diagnosis Date  . Depression   . Arthritis   . Bipolar 1 disorder     ADL's:  Intact  Sleep: Fair  Appetite:  Fair  Suicidal Ideation:  Patient endorses suicidal thoughts with plan to lie in traffic. Homicidal Ideation:  Patient denies any thought, plan, or intent AEB (as evidenced by):  Psychiatric Specialty Exam: Review of Systems  Constitutional: Negative.   HENT: Negative.   Eyes: Negative.   Respiratory: Negative.   Cardiovascular: Negative.   Gastrointestinal: Negative.   Genitourinary: Negative.   Musculoskeletal: Negative.   Skin: Negative.   Neurological: Negative.   Endo/Heme/Allergies: Negative.   Psychiatric/Behavioral: Positive for depression, suicidal ideas, hallucinations and substance abuse. The patient is nervous/anxious and has insomnia.     Blood pressure 123/69, pulse 97, temperature 98.3 F (36.8 C), temperature source Oral, resp. rate 20, height 6\' 2"  (1.88 m), weight 112.946 kg (249 lb).Body mass index is 31.97 kg/(m^2).  General Appearance: Casual and Fairly Groomed  Patent attorney::  Good  Speech:  Clear and Coherent  Volume:  Decreased  Mood:  Depressed  Affect:  Congruent  Thought Process:   Linear  Orientation:  Full (Time, Place, and Person)  Thought Content:  Rumination  Suicidal Thoughts:  Yes.  with intent/plan  Homicidal Thoughts:  No  Memory:  Immediate;   Good Recent;   Good Remote;   Good  Judgement:  Impaired  Insight:  Lacking  Psychomotor Activity:  Decreased  Concentration:  Good  Recall:  Good  Akathisia:  No  Handed:  Right  AIMS (if indicated):     Assets:  Communication Skills Desire for Improvement  Sleep:  Number of Hours: 1    Current Medications: Current Facility-Administered Medications  Medication Dose Route Frequency Provider Last Rate Last Dose  . acetaminophen (TYLENOL) tablet 650 mg  650 mg Oral Q6H PRN Shuvon Rankin, NP   650 mg at 04/26/12 0916  . alum & mag hydroxide-simeth (MAALOX/MYLANTA) 200-200-20 MG/5ML suspension 30 mL  30 mL Oral Q4H PRN Shuvon Rankin, NP      . hydrOXYzine (ATARAX/VISTARIL) tablet 50 mg  50 mg Oral QID PRN Jorje Guild, PA-C      . ibuprofen (ADVIL,MOTRIN) tablet 600 mg  600 mg Oral Q6H PRN Sanjuana Kava, NP   600 mg at 04/30/12 2134  . lamoTRIgine (LAMICTAL) tablet 50 mg  50 mg Oral BID Sanjuana Kava, NP   50 mg at 05/01/12 1003  . magnesium hydroxide (MILK OF MAGNESIA) suspension 30 mL  30 mL Oral Daily PRN Shuvon Rankin, NP      . risperiDONE (RISPERDAL) tablet 1 mg  1 mg Oral BID Sanjuana Kava, NP   1 mg at  05/01/12 1002  . traZODone (DESYREL) tablet 50 mg  50 mg Oral QHS,MR X 1 Shuvon Rankin, NP      . [DISCONTINUED] hydrOXYzine (ATARAX/VISTARIL) tablet 50 mg  50 mg Oral TID PRN Sanjuana Kava, NP   50 mg at 05/01/12 1319    Lab Results: No results found for this or any previous visit (from the past 48 hour(s)).  Physical Findings: AIMS: Facial and Oral Movements Muscles of Facial Expression: None, normal Lips and Perioral Area: None, normal Jaw: None, normal Tongue: None, normal,Extremity Movements Upper (arms, wrists, hands, fingers): None, normal Lower (legs, knees, ankles, toes): None, normal, Trunk  Movements Neck, shoulders, hips: None, normal, Overall Severity Severity of abnormal movements (highest score from questions above): None, normal Incapacitation due to abnormal movements: None, normal Patient's awareness of abnormal movements (rate only patient's report): No Awareness, Dental Status Current problems with teeth and/or dentures?: Yes Does patient usually wear dentures?: Yes  CIWA:  CIWA-Ar Total: 0  COWS:  COWS Total Score: 0   Treatment Plan Summary: Daily contact with patient to assess and evaluate symptoms and progress in treatment Medication management We will make a higher dose of Vistaril available to him at bedtime to target his inability to fall asleep.  Plan:  Medical Decision Making Problem Points:  Established problem, stable/improving (1), Review of last therapy session (1) and Review of psycho-social stressors (1) Data Points:  Review or order clinical lab tests (1) Review of new medications or change in dosage (2)  I certify that inpatient services furnished can reasonably be expected to improve the patient's condition.   Deborrah Mabin 05/01/2012, 2:42 PM

## 2012-05-01 NOTE — Progress Notes (Signed)
[name] is sad, depressed and keeping to himself  Today. He avoids eye contact. He minimizes his depression and his sad feelings, as well as his isolating and missing [facility] [employer] group this morning...rationalizing " I just couldn't make it".   A HE takes his meds as ordered and he completes his AM self inventory . On it he writes his depression and hopelessness are " 10/10", respectively, that he has " constant SI" but he contracts for safety with this Clinical research associate.   R POC cont and he is encouraged to attend his groups  And take an active role in his recovery.

## 2012-05-01 NOTE — Progress Notes (Signed)
Writer observed patient up in the dayroom interacting appropriately with select peers. Patient attended group this evening and participated. Patient came to medication window and requested medication for pain and anxiety. Patient denies hi/a/v hall and verbally contracts for passive si. Support and encouragement offered, safety maintained on unit with 15 min checks, will continue to monitor.

## 2012-05-01 NOTE — Progress Notes (Signed)
Psychoeducational Group Note  Date:  04/30/2012 Time: 2000  Group Topic/Focus:  Wrap-Up Group:   The focus of this group is to help patients review their daily goal of treatment and discuss progress on daily workbooks.  Participation Level:  Active  Participation Quality:  Appropriate  Affect:  Appropriate  Cognitive:  Appropriate  Insight:  Engaged  Engagement in Group:  Engaged  Additional Comments:  Pt attended wrap-up group this evening and participated.   Brynna Dobos A 05/01/2012, 3:07 AM

## 2012-05-02 NOTE — Progress Notes (Signed)
Writer spoke with patient 1:1 and he discussed how hopeless and dissappointed he is with himself. Patient reports that it seems that his meds are not working and his trigger is coming back to [employer] for thanksgiving. Patient reports that he knows he can't return to his mothers and he feels suicidal because he has been dealing with his mental illness for a long time and now he is 51 years old and just tired of dealing with it. Patient reports that he did better when he was in [place]. Writer offered patient support and encouragement, patient requested visteril and ibuprofen which he received, writer will monitor effectiveness of medication. Safety maintained on unit, will continue to monitor.

## 2012-05-02 NOTE — Progress Notes (Signed)
D) Pt rates his depression and hopelessness both at a 10 and feels constant suicidal thoughts. Denies HI. States that he was doing well when he was in [place], [place] and when he came back here to visit with his family for Thanksgiving he ran into trouble and ended back in the hospital. States, "I was doing well until I came back to [place]". Was upset over a situation that occurred in group during the week and said "I handled it poorly". A) Pt.  Given support and reassurance along with praise. Given encouragement to attend the groups and to verbalize his feelings. Verbal contract made with Pt. R) Continues to verbalize thoughts of SI but is contracting for his safety. Attending groups to day and participating.

## 2012-05-02 NOTE — Clinical Social Work Note (Signed)
[facility] Group Notes: (Clinical Social Work)   05/02/2012   3-4pm   Type of Therapy:  Group Therapy   Participation Level:  Did Not Attend    Ambrose Mantle, LCSW 05/02/2012, 6:04 PM

## 2012-05-02 NOTE — Progress Notes (Signed)
[name] MD Progress Note  05/02/2012 1:33 PM [name]  MRN:  [id number] Subjective:  [name] continues to express a high degree of depression, rating it as a 10 on a scale of 1-10 where 10 is the worst. He also is having a significant amount of anxiety today, and rates it as a 7 on a scale of 1-10. He also endorses suicidal thoughts that come and go, with a plan to lie in traffic. He denies any homicidal thoughts. He continues to experience auditory hallucinations, but denies any visual hallucinations. He reports he slept slightly better last night but not as well as he would like. His appetite is good.  Diagnosis:   Axis I: Bipolar, mixed Axis II: Deferred Axis III:  Past Medical History  Diagnosis Date  . Depression   . Arthritis   . Bipolar 1 disorder     ADL's:  Intact  Sleep: Fair  Appetite:  Good  Suicidal Ideation:  Plan:  To lie in traffic Intent:  No Means:  No Homicidal Ideation:  Patient denies any thought, plan, or intent AEB (as evidenced by):  Psychiatric Specialty Exam: Review of Systems  Constitutional: Negative.   HENT: Negative.   Eyes: Negative.   Respiratory: Negative.   Cardiovascular: Negative.   Gastrointestinal: Negative.   Genitourinary: Negative.   Musculoskeletal: Negative.   Skin: Negative.   Neurological: Negative.   Endo/Heme/Allergies: Negative.   Psychiatric/Behavioral: Positive for depression, suicidal ideas, hallucinations and substance abuse. The patient is nervous/anxious and has insomnia.     Blood pressure 121/80, pulse 82, temperature 97.6 F (36.4 C), temperature source Oral, resp. rate 16, height 6\' 2"  (1.88 m), weight 112.946 kg (249 lb).Body mass index is 31.97 kg/(m^2).  General Appearance: Neat and Well Groomed  Patent attorney::  Good  Speech:  Clear and Coherent  Volume:  Normal  Mood:  Anxious  Affect:  Congruent  Thought Process:  Circumstantial  Orientation:  Full (Time, Place, and Person)  Thought Content:   Hallucinations: Auditory and Obsessions  Suicidal Thoughts:  Yes.  with intent/plan  Homicidal Thoughts:  No  Memory:  Immediate;   Good Recent;   Good Remote;   Good  Judgement:  Fair  Insight:  Fair  Psychomotor Activity:  Increased  Concentration:  Good  Recall:  Good  Akathisia:  No  Handed:  Right  AIMS (if indicated):     Assets:  Communication Skills Desire for Improvement Resilience  Sleep:  Number of Hours: 5.75    Current Medications: Current Facility-Administered Medications  Medication Dose Route Frequency Provider Last Rate Last Dose  . acetaminophen (TYLENOL) tablet 650 mg  650 mg Oral Q6H PRN Shuvon Rankin, NP   650 mg at 04/26/12 0916  . alum & mag hydroxide-simeth (MAALOX/MYLANTA) 200-200-20 MG/5ML suspension 30 mL  30 mL Oral Q4H PRN Shuvon Rankin, NP      . hydrOXYzine (ATARAX/VISTARIL) tablet 50 mg  50 mg Oral QID PRN Jorje Guild, PA-C   50 mg at 05/01/12 2149  . ibuprofen (ADVIL,MOTRIN) tablet 600 mg  600 mg Oral Q6H PRN Sanjuana Kava, NP   600 mg at 05/02/12 0934  . lamoTRIgine (LAMICTAL) tablet 50 mg  50 mg Oral BID Sanjuana Kava, NP   50 mg at 05/02/12 0932  . magnesium hydroxide (MILK OF MAGNESIA) suspension 30 mL  30 mL Oral Daily PRN Shuvon Rankin, NP      . risperiDONE (RISPERDAL) tablet 1 mg  1 mg Oral BID Sanjuana Kava,  NP   1 mg at 05/02/12 0932  . traZODone (DESYREL) tablet 50 mg  50 mg Oral QHS,MR X 1 Shuvon Rankin, NP      . [DISCONTINUED] hydrOXYzine (ATARAX/VISTARIL) tablet 50 mg  50 mg Oral TID PRN Sanjuana Kava, NP   50 mg at 05/01/12 1319    Lab Results: No results found for this or any previous visit (from the past 48 hour(s)).  Physical Findings: AIMS: Facial and Oral Movements Muscles of Facial Expression: None, normal Lips and Perioral Area: None, normal Jaw: None, normal Tongue: None, normal,Extremity Movements Upper (arms, wrists, hands, fingers): None, normal Lower (legs, knees, ankles, toes): None, normal, Trunk Movements Neck,  shoulders, hips: None, normal, Overall Severity Severity of abnormal movements (highest score from questions above): None, normal Incapacitation due to abnormal movements: None, normal Patient's awareness of abnormal movements (rate only patient's report): No Awareness, Dental Status Current problems with teeth and/or dentures?: Yes Does patient usually wear dentures?: Yes  CIWA:  CIWA-Ar Total: 0  COWS:  COWS Total Score: 0   Treatment Plan Summary: Daily contact with patient to assess and evaluate symptoms and progress in treatment Medication management  Plan: We will continue his current plan of care. Uri would likely benefit from counseling therapy. Medical Decision Making Problem Points:  Established problem, stable/improving (1), Review of last therapy session (1) and Review of psycho-social stressors (1) Data Points:  Review and summation of old records (2) Review of medication regiment & side effects (2)  I certify that inpatient services furnished can reasonably be expected to improve the patient's condition.   [name] 05/02/2012, 1:33 PM

## 2012-05-02 NOTE — Progress Notes (Signed)
D: Pt remained in bed for much of day, although did get up during evening shift for group and meds and spent some time with peers.  Outlook appears hopeless;  affect and mood are depressed and anxious.  Pt's mother has apparently informed him that he cannot return to her home.  Pt's facial expression remains flat, at times blank, despite maintenance of eye contact with this Clinical research associate.  Interactive style was forthright/assertive, but word content was bleak regarding his future.  Speech logical and coherent with normal rate/rhythm/decreased volume; no evidence noted of disorganized thought process or content.  Pt admits to continuing SI, but is contracting for safety on unit.  Denies SI, AVH, and acute pain.  Impression is that Pt is on verge of giving up.  A: Encouraged Pt to consider his reentry after discharge to be a step-wise process in which he can experience successes.  Utilized active listening to allow Pt to discharge negative energy and later supported him in reframing some of his earlier statements in positive terms.  Pt received Vistaril 50mg  PO @2149 .  Refused Trazodone, as he has done since admission.  All medications administered according to med orders and POC.  Q15 minute safety checks maintained as per unit protocol.  R: Pt cooperative but dis-spirited.  Safety maintained. Dion Saucier RN

## 2012-05-03 MED ORDER — LAMOTRIGINE 100 MG PO TABS
100.0000 mg | ORAL_TABLET | Freq: Two times a day (BID) | ORAL | Status: DC
  Administered 2012-05-03 – 2012-05-07 (×8): 100 mg via ORAL
  Filled 2012-05-03 (×2): qty 1
  Filled 2012-05-03: qty 28
  Filled 2012-05-03: qty 1
  Filled 2012-05-03: qty 28
  Filled 2012-05-03 (×6): qty 1
  Filled 2012-05-03 (×2): qty 28
  Filled 2012-05-03: qty 1

## 2012-05-03 NOTE — Progress Notes (Signed)
[facility] MD Progress Note  05/03/2012 2:29 PM [name]  MRN:  [id number]  Subjective: "I feel like I'm manic today. That is why I am speaking very fast. I'm tired of my mood going up and down. Just yesterday, I was feeling so depressed, Today is the opposite. My depression is at #10.  The Social Worker got me very angry this morning during group. She was trying to get me to say something, I told her that I don't feel like it. She keep pressing on me speaking. My mother does not want me to come. I am distraught about it even though I realized that I blew each chance that she has ever given me. I am still feeling suicidal".    Diagnosis:   Axis I: Bipolar disorder, current episode manic severe with psychotic features Axis II: Deferred Axis III:  Past Medical History  Diagnosis Date  . Depression   . Arthritis   . Bipolar 1 disorder    Axis IV: economic problems, housing problems, occupational problems and other psychosocial or environmental problems Axis V: 41-50 serious symptoms  ADL's:  Fairly intact  Sleep: Fair  Appetite:  Good  Suicidal Ideation: "Yes" Plan:  No Intent:  No Means:  No Homicidal Ideation: "No" Plan:  No Intent:  No Means:  No  AEB (as evidenced by): Per patient's reports.  Psychiatric Specialty Exam: Review of Systems  Constitutional: Negative.   HENT: Negative.   Eyes: Negative.   Respiratory: Negative.   Cardiovascular: Negative.   Gastrointestinal: Negative.   Genitourinary: Negative.   Musculoskeletal: Negative.   Skin: Negative.   Neurological: Negative.   Endo/Heme/Allergies: Negative.   Psychiatric/Behavioral: Positive for depression, suicidal ideas and substance abuse. Negative for hallucinations and memory loss. The patient is nervous/anxious. The patient does not have insomnia.     Blood pressure 110/66, pulse 91, temperature 97.7 F (36.5 C), temperature source Oral, resp. rate 16, height 6\' 2"  (1.88 m), weight 112.946 kg (249  lb).Body mass index is 31.97 kg/(m^2).  General Appearance: Casual and Fairly Groomed  Patent attorney::  Good  Speech:  Clear and Coherent and Pressured  Volume:  Increased  Mood:  Depressed  Affect:  Flat  Thought Process:  Coherent and Intact  Orientation:  Full (Time, Place, and Person)  Thought Content:  Rumination  Suicidal Thoughts:  Yes.  without intent/plan  Homicidal Thoughts:  No  Memory:  Immediate;   Good Recent;   Good Remote;   Good  Judgement:  Impaired  Insight:  Lacking  Psychomotor Activity:  Restlessness  Concentration:  Poor  Recall:  Good  Akathisia:  No  Handed:  Right  AIMS (if indicated):     Assets:  Desire for Improvement  Sleep:  Number of Hours: 5.5    Current Medications: Current Facility-Administered Medications  Medication Dose Route Frequency Provider Last Rate Last Dose  . acetaminophen (TYLENOL) tablet 650 mg  650 mg Oral Q6H PRN Shuvon Rankin, NP   650 mg at 04/26/12 0916  . alum & mag hydroxide-simeth (MAALOX/MYLANTA) 200-200-20 MG/5ML suspension 30 mL  30 mL Oral Q4H PRN Shuvon Rankin, NP      . hydrOXYzine (ATARAX/VISTARIL) tablet 50 mg  50 mg Oral QID PRN Jorje Guild, PA-C   50 mg at 05/02/12 2215  . ibuprofen (ADVIL,MOTRIN) tablet 600 mg  600 mg Oral Q6H PRN Sanjuana Kava, NP   600 mg at 05/03/12 0659  . lamoTRIgine (LAMICTAL) tablet 100 mg  100 mg Oral BID  Sanjuana Kava, NP      . magnesium hydroxide (MILK OF MAGNESIA) suspension 30 mL  30 mL Oral Daily PRN Shuvon Rankin, NP      . risperiDONE (RISPERDAL) tablet 1 mg  1 mg Oral BID Sanjuana Kava, NP   1 mg at 05/03/12 0804  . traZODone (DESYREL) tablet 50 mg  50 mg Oral QHS,MR X 1 Shuvon Rankin, NP      . [DISCONTINUED] lamoTRIgine (LAMICTAL) tablet 50 mg  50 mg Oral BID Sanjuana Kava, NP   50 mg at 05/03/12 1610    Lab Results: No results found for this or any previous visit (from the past 48 hour(s)).  Physical Findings: AIMS: Facial and Oral Movements Muscles of Facial Expression:  None, normal Lips and Perioral Area: None, normal Jaw: None, normal Tongue: None, normal,Extremity Movements Upper (arms, wrists, hands, fingers): None, normal Lower (legs, knees, ankles, toes): None, normal, Trunk Movements Neck, shoulders, hips: None, normal, Overall Severity Severity of abnormal movements (highest score from questions above): None, normal Incapacitation due to abnormal movements: None, normal Patient's awareness of abnormal movements (rate only patient's report): No Awareness, Dental Status Current problems with teeth and/or dentures?: No Does patient usually wear dentures?: No  CIWA:  CIWA-Ar Total: 0  COWS:  COWS Total Score: 0   Treatment Plan Summary: Daily contact with patient to assess and evaluate symptoms and progress in treatment Medication management  Plan: Increase Lamictal from 50 mg to 100 mg bid for mood stabilization. Encouraged group perticiptaion. Continue current treatment plan.  Medical Decision Making Problem Points:  Established problem, worsening (2), Review of last therapy session (1) and Review of psycho-social stressors (1) Data Points:  Review of medication regiment & side effects (2) Review of new medications or change in dosage (2)  I certify that inpatient services furnished can reasonably be expected to improve the patient's condition.   [name] 05/03/2012, 2:29 PM
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
## 2012-05-03 NOTE — Progress Notes (Signed)
Psychoeducational Group Note  Date:  05/03/2012 Time: 2000 Group Topic/Focus:  AA group  Participation Level:  Active  Participation Quality:  Appropriate  Affect:  Appropriate  Cognitive:  Appropriate  Insight:  Engaged  Engagement in Group:  Engaged  Additional Comments:  Pt. attended AA group  [name] Patience 05/03/2012, 11:14 PM

## 2012-05-03 NOTE — Progress Notes (Signed)
Patient did attend the evening speaker AA meeting.  

## 2012-05-03 NOTE — Progress Notes (Signed)
Psychoeducational Group Note  Date:  05/03/2012 Time:  1100   Group Topic/Focus:  Wellness Toolbox:   The focus of this group is to discuss various aspects of wellness, balancing those aspects and exploring ways to increase the ability to experience wellness.  Patients will create a wellness toolbox for use upon discharge.  Participation Level:  Active  Participation Quality:  Attentive  Affect:  Appropriate  Cognitive:  Alert and Appropriate  Insight:  Engaged  Engagement in Group:  Engaged  Additional Comments:  Pt. Participated in group and identified tools for wellness.   Ruta Hinds [facility] 05/03/2012, 3:28 PM

## 2012-05-03 NOTE — Social Work (Signed)
Interdisciplinary Treatment Plan Update (Adult)  Date:  05/03/2012  Time Reviewed:  6:58 AM    Progress in Treatment: Attending groups:   Yes   Participating in groups:  Yes Taking medication as prescribed:  Yes Tolerating medication:  Yes Family/Significant othe contact made: Contact made with family and family will not allow patient to return Patient understands diagnosis:  Yes Discussing patient identified problems/goals with staff: Yes Medical problems stabilized or resolved: Yes Denies suicidal/homicidal ideation: No -  Patient able to contract for safety Issues/concerns per patient self-inventory:  Other:  New problem(s) identified: Patient boldly stated in group that his mother had threatened to sue the hospital if he were discharged and committed suicide.  Reason for Continuation of Hospitalization: Anxiety Depression Medication stabilization Suicidal ideation  Interventions implemented related to continuation of hospitalization:  Medication mgement; safety checks q 15 mins; coping skills development  Additional comments: Patient rates himself high for depression and suicidal ideations. Patient presents extremely irritable and refuses to discuss issues in group. Patient makes threats of killing himself if he was discharged from hospital.  Estimated length of stay: 3-5 days  Discharge Plan:  Outpatient follow up  New goal(s):  Review of initial/current patient goals per problem list:    1.  Goal(s): Eliminate SI/other thoughts of self harm   Met:  No  Target date: d/c  As evidenced by: Patient will no longer endorse SI/HI or other thoughts of self harm.    2.  Goal (s):Reduce depression/anxiety  Met: No  Target date: d/c  As evidenced by: Patient will rate symptoms at four or below    3.  Goal(s):.stabilize on meds   Met:  No  Target date: d/c  As evidenced by: Patient will report being stabilized on medications - less symptomatic    4.   Goal(s): Refer for outpatient follow up   Met:  No, not at this time  Target date: d/c  As evidenced by: Follow up appointment will be scheduled    Attendees: Patient:   @TD  6:58 AM  Physican:  Patrick North, MD @TD  6:58 AM  Nursing:  Neill Loft, RN  05/03/2012 6:58 AM  Nursing:   Roswell Miners, RN  @TD  6:58 AM  Clinical Social Worker:  Patton Salles, LCSW @TD  6:58 AM  Other: Joanell Rising 05/03/2012 6:58 AM   Other:         05/03/2012 6:58 AM Other:

## 2012-05-03 NOTE — Progress Notes (Signed)
Patient ID: [name], male   DOB: 1961-03-23, 51 y.o.   MRN: [id number] He has been up interacting with peers and staff. He continues to voice that he  Will harm himself if released. His Self Inventory: depression and hopelessness at 10, reported agitation( none noted). And he has SI thoughts constantly, contracts for safety  here. Had not requested  Any prn medication.

## 2012-05-04 DIAGNOSIS — F3130 Bipolar disorder, current episode depressed, mild or moderate severity, unspecified: Secondary | ICD-10-CM

## 2012-05-04 MED ORDER — HYDROXYZINE HCL 50 MG PO TABS
100.0000 mg | ORAL_TABLET | Freq: Every evening | ORAL | Status: DC | PRN
  Administered 2012-05-05 – 2012-05-06 (×2): 100 mg via ORAL
  Filled 2012-05-04: qty 2

## 2012-05-04 NOTE — Progress Notes (Addendum)
D:  Patient's self inventory sheet, patient has poor sleep, improving appetite, low energy level, improving attention span.  Rated depression and hpelessness #10.  Has felt agitation in past 24 hours.  Constant SI, contracts for safety.  Has felt pain in past 24 hours.  Pain goal today #10, worst pain #10.  "I don't me" after discharge.  No questions for staff.  No discharge plans.   Will have problems taking meds after discharge. A:  Medications administered per MD order.  Support and encouragement given throughout day.  Support and safety checks completed as ordered.   R:  Following treatment plan.   SI, contracts for safety.  Denied A/V hallucinations.  Contracts for safety.  Patient remains safe and receptive on unit. Patient asks for pain medication and seen talking, laughing with peers.

## 2012-05-04 NOTE — Progress Notes (Signed)
Currently resting quietly in bed with eyes closed. Respirations are even and unlabored. No acute distress/discomfort noted. Safety has been maintained with Q15 minute observation. Will continue current POC.

## 2012-05-04 NOTE — Progress Notes (Signed)
Grief Group  The group focused on the various ways in which grief around loss is experienced and the impact of certain losses on identity. There was discussion of what happens when grief is not embraced but bottled up or avoided. Group members were actively engaged with each other in supporting each other. Pt was attentive.   [name]
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
## 2012-05-04 NOTE — Progress Notes (Signed)
D: Patient in dayroom on approach.  Patient visible on the unit and interacting with peers.  Patient states he had a good day.  Patient states he has been manic and he like the feeling but when he is on his meds it brings him down and then he becomes depressed.  Patient states he has been enjoying his groups today.  Patient states his depression is 10/10.  Patient complains of passive SI but verbally contracts for safety.  Patient denies HI and denies AVH. A: Staff to monitor Q 15 mins for safety.  Encouragement and support offered.  Prn Vistaril administered for anxiety. R: Patient remains safe on the unit.  Patient attended AA group tonight.  Patient taking administered medications.

## 2012-05-04 NOTE — Progress Notes (Signed)
[facility] MD Progress Note  05/04/2012 3:15 PM [name]  MRN:  [id number] Subjective: Patient depressed with suicidal thoughts, contracts for safety Diagnosis:   Axis I: Bipolar, Depressed Axis II: Deferred Axis III:  Past Medical History  Diagnosis Date  . Depression   . Arthritis   . Bipolar 1 disorder    Axis IV: other psychosocial or environmental problems, problems related to social environment and problems with primary support group Axis V: 41-50 serious symptoms  ADL's:  Intact  Sleep: Poor  Appetite:  Fair  Suicidal Ideation:  Yes, but contracts for safety Homicidal Ideation:  Denies  Psychiatric Specialty Exam: Review of Systems  Constitutional: Negative.   HENT: Negative.   Eyes: Negative.   Respiratory: Negative.   Cardiovascular: Negative.   Gastrointestinal: Negative.   Genitourinary: Negative.   Musculoskeletal: Negative.   Skin: Negative.   Neurological: Negative.   Endo/Heme/Allergies: Negative.   Psychiatric/Behavioral: Positive for depression, suicidal ideas, hallucinations and memory loss. The patient has insomnia.     Blood pressure 109/73, pulse 93, temperature 98.3 F (36.8 C), temperature source Oral, resp. rate 20, height 6\' 2"  (1.88 m), weight 112.946 kg (249 lb).Body mass index is 31.97 kg/(m^2).  General Appearance: Casual  Eye Contact::  Fair  Speech:  Normal Rate  Volume:  Normal  Mood:  Depressed  Affect:  Congruent and Depressed  Thought Process:  Intact and Logical  Orientation:  Full (Time, Place, and Person)  Thought Content:  Hallucinations: Auditory Visual  Suicidal Thoughts:  Yes.  without intent/plan  Homicidal Thoughts:  No  Memory:  Immediate;   Fair Recent;   Fair Remote;   Fair  Judgement:  Fair  Insight:  Fair  Psychomotor Activity:  Decreased  Concentration:  Fair  Recall:  Fair  Akathisia:  No  Handed:  Right  AIMS (if indicated):     Assets:  Communication Skills Desire for  Improvement Resilience Social Support  Sleep:  Number of Hours: 5.5    Current Medications: Current Facility-Administered Medications  Medication Dose Route Frequency Provider Last Rate Last Dose  . acetaminophen (TYLENOL) tablet 650 mg  650 mg Oral Q6H PRN Shuvon Rankin, NP   650 mg at 04/26/12 0916  . alum & mag hydroxide-simeth (MAALOX/MYLANTA) 200-200-20 MG/5ML suspension 30 mL  30 mL Oral Q4H PRN Shuvon Rankin, NP      . hydrOXYzine (ATARAX/VISTARIL) tablet 100 mg  100 mg Oral QHS PRN Nanine Means, NP      . hydrOXYzine (ATARAX/VISTARIL) tablet 50 mg  50 mg Oral QID PRN Jorje Guild, PA-C   50 mg at 05/03/12 2156  . ibuprofen (ADVIL,MOTRIN) tablet 600 mg  600 mg Oral Q6H PRN Sanjuana Kava, NP   600 mg at 05/04/12 0842  . lamoTRIgine (LAMICTAL) tablet 100 mg  100 mg Oral BID Sanjuana Kava, NP   100 mg at 05/04/12 0841  . magnesium hydroxide (MILK OF MAGNESIA) suspension 30 mL  30 mL Oral Daily PRN Shuvon Rankin, NP      . risperiDONE (RISPERDAL) tablet 1 mg  1 mg Oral BID Sanjuana Kava, NP   1 mg at 05/04/12 0841  . [DISCONTINUED] traZODone (DESYREL) tablet 50 mg  50 mg Oral QHS,MR X 1 Shuvon Rankin, NP        Lab Results: No results found for this or any previous visit (from the past 48 hour(s)).  Physical Findings: AIMS: Facial and Oral Movements Muscles of Facial Expression: None, normal Lips and Perioral Area:  None, normal Jaw: None, normal Tongue: None, normal,Extremity Movements Upper (arms, wrists, hands, fingers): None, normal Lower (legs, knees, ankles, toes): None, normal, Trunk Movements Neck, shoulders, hips: None, normal, Overall Severity Severity of abnormal movements (highest score from questions above): None, normal Incapacitation due to abnormal movements: None, normal Patient's awareness of abnormal movements (rate only patient's report): No Awareness, Dental Status Current problems with teeth and/or dentures?: No Does patient usually wear dentures?: No  CIWA:   CIWA-Ar Total: 0  COWS:  COWS Total Score: 0   Treatment Plan Summary: Daily contact with patient to assess and evaluate symptoms and progress in treatment Medication management  Plan:  [name] expresses depression of a 7/10, suicidal ideation without a plan, depressed affect, hopelessness, hears voices at times of his dead grandmother, states he was "up and down" last night--medication adjusted to assist with his sleep issues, appetite good, requested Lamictal increase but just increased yesterday--patient aware that has to taper up to the effective dosage slowly, reports memory loss, fair concentration, states frustration with feeling manic then crashing and now depressed, denies any physical issues, close monitoring continues.  Medical Decision Making Problem Points:  Review of psycho-social stressors (1) Data Points:  Review of medication regiment & side effects (2) Review of new medications or change in dosage (2)  I certify that inpatient services furnished can reasonably be expected to improve the patient's condition.   Nanine Means, PMH-NP 05/04/2012, 3:15 PM

## 2012-05-04 NOTE — Progress Notes (Signed)
Psychoeducational Group Note  Date:  05/04/2012 Time: 2000 Group Topic/Focus:  AA group  Participation Level:  Active  Participation Quality:  Appropriate  Affect:  Appropriate  Cognitive:  Appropriate  Insight:  Improving  Engagement in Group:  Improving  Additional Comments:  Pt attended and participated in AA group    [name] Patience 05/04/2012, 9:56 PM

## 2012-05-05 NOTE — Social Work (Signed)
[facility] LCSW Aftercare Discharge Planning Group Note  05/05/2012  8:45 AM  Participation Quality:  Appropriate  Affect:  Blunted, Depressed and Flat  Cognitive:  Appropriate  Insight:  Improving  Engagement in Group:  Engaged  Modes of Intervention:  Discussion, Education and Support  Summary of Progress/Problems: Pt attended discharge planning group and actively participated in group.  CSW provided pt with today's workbook. Patient continues to report strong suicidality with plan to walk in front of a car if he is  discharged from hospital today. Patient rates himself an 8 across-the-board for depression, anxiety, helplessness, and hopelessness. Patient reports M.D. increased the doses of some of his medications and says he is waiting for his meds to become stabilized. Patient stated he had a good conversation with his mother over the phone last night but says family is still afraid to take him back into their home with his current state of mental illness. Patton Salles LCSW    [facility] LCSW Group Therapy  05/05/2012 1:15 PM  Type of Therapy:  Group Therapy  Participation Level:  Active  Participation Quality:  Appropriate, Attentive and Sharing  Affect:  Blunted, Depressed and Flat  Cognitive:  Appropriate  Insight:  Improving  Engagement in Therapy:  Engaged  Modes of Intervention:  Discussion, Education, Exploration, Socialization and Support  Summary of Progress/Problems: Patient participated much better in today's group focused on emotional regulation. Patient reports the hardest emotion he's dealing with is "feeling stuck in hopelessness". Patient reports he is trying to play and hold onto his faith as a means of coping with his hopelessness but reports peers going home from hospital feels like a loss of support for him. Patient reports "being a long is unbearable for me. I know my family loves me but they don't understand me."  05/05/2012 6:56 AM

## 2012-05-05 NOTE — Progress Notes (Signed)
Reviewed

## 2012-05-05 NOTE — Social Work (Signed)
Interdisciplinary Treatment Plan Update (Adult)  Date:  05/05/2012  Time Reviewed:  6:56 AM    Progress in Treatment: Attending groups:   Yes   Participating in groups:  Yes Taking medication as prescribed:  Yes Tolerating medication:  Yes Family/Significant othe contact made: Contact made with family Patient understands diagnosis:  Yes Discussing patient identified problems/goals with staff: Yes Medical problems stabilized or resolved: Yes Denies suicidal/homicidal ideation: No -  Patient able to contract for safety Issues/concerns per patient self-inventory: Patient reports chronic suicidality with an active plan Other:  New problem(s) identified: None  Reason for Continuation of Hospitalization: Anxiety Depression Medication stabilization Suicidal ideation  Interventions implemented related to continuation of hospitalization:  Medication mgement; safety checks q 15 mins; coping skills development  Additional comments: Patient continues to report strong suicidal ideations and his plan of walking into traffic. Patient for medical and Vistaril had been increased though patient reports he is not stable on his medications. Patient continues to rate himself high (8s across-the-board) for depression, anxiety, helplessness and hopelessness and believes he will kill himself if discharged today.  Estimated length of stay: 3-5 days  Discharge Plan:  Outpatient follow up  New goal(s):  Review of initial/current patient goals per problem list:    1.  Goal(s): Eliminate SI/other thoughts of self harm   Met:  No  Target date: d/c  As evidenced by: Patient will no longer endorse SI/HI or other thoughts of self harm.    2.  Goal (s):Reduce depression/anxiety  Met: Yes  Target date: d/c  As evidenced by: Patient will rate symptoms at four or below    3.  Goal(s):.stabilize on meds   Met:  No  Target date: d/c  As evidenced by: Patient will report being stabilized  on medications - less symptomatic    4.  Goal(s): Refer for outpatient follow up   Met:  No  Target date: d/c  As evidenced by: Follow up appointment will be scheduled    Attendees: Patient:   @TD  6:56 AM  Physican:  Patrick North, MD @TD  6:56 AM  Nursing:  Carney Living, RN  05/05/2012 6:56 AM  Nursing:    @TD  6:56 AM  Clinical Social Worker:  Patton Salles, LCSW @TD  6:56 AM  Other: Joanell Rising 05/05/2012 6:56 AM   Other:         05/05/2012 6:56 AM Other:

## 2012-05-05 NOTE — Progress Notes (Signed)
Patient ID: [name], male   DOB: May 04, 1961, 51 y.o.   MRN: [id number]  Problem: Bipolar Disorder  D: Patient cooperative and pleasant during shift. Pt endorses depression but is appropriate in milieu. A: Monitor patient Q 15 minutes for safety, encourage staff/peer interaction and group participation. Administer medications as ordered by MD. R: Patient compliant with medications and group session. No inappropriate behaviors noted.

## 2012-05-05 NOTE — Progress Notes (Signed)
[facility] MD Progress Note  05/05/2012 1:46 PM [name]  MRN:  [id number]  Subjective:  Patient continues to endorse suicidal thoughts and depression. Patient tolerating Lamictal increase well without side effcets.  Diagnosis:   Axis I: Bipolar, mixed Axis II: Deferred Axis III:  Past Medical History  Diagnosis Date  . Depression   . Arthritis   . Bipolar 1 disorder    Axis IV: economic problems, housing problems and occupational problems Axis V: 51-60 moderate symptoms  ADL's:  Intact  Sleep: Fair  Appetite:  Fair  Psychiatric Specialty Exam: Review of Systems  Constitutional: Negative.   HENT: Negative.   Eyes: Negative.   Respiratory: Negative.   Cardiovascular: Negative.   Gastrointestinal: Negative.   Genitourinary: Negative.   Musculoskeletal: Negative.   Skin: Negative.   Neurological: Negative.   Endo/Heme/Allergies: Negative.   Psychiatric/Behavioral: Negative.     Blood pressure 117/62, pulse 89, temperature 97.9 F (36.6 C), temperature source Oral, resp. rate 18, height 6\' 2"  (1.88 m), weight 112.946 kg (249 lb).Body mass index is 31.97 kg/(m^2).  General Appearance: Casual  Eye Contact::  Fair  Speech:  Pressured  Volume:  Increased  Mood:  Depressed and Dysphoric  Affect:  Flat  Thought Process:  Coherent  Orientation:  Full (Time, Place, and Person)  Thought Content:  WDL  Suicidal Thoughts:  Yes.  without intent/plan  Homicidal Thoughts:  No  Memory:  Immediate;   Fair Recent;   Fair Remote;   Fair  Judgement:  Fair  Insight:  Shallow  Psychomotor Activity:  Normal  Concentration:  Fair  Recall:  Fair  Akathisia:  No  Handed:  Right  AIMS (if indicated):     Assets:  Communication Skills  Sleep:  Number of Hours: 6    Current Medications: Current Facility-Administered Medications  Medication Dose Route Frequency Provider Last Rate Last Dose  . acetaminophen (TYLENOL) tablet 650 mg  650 mg Oral Q6H PRN Shuvon Rankin, NP   650 mg at  04/26/12 0916  . alum & mag hydroxide-simeth (MAALOX/MYLANTA) 200-200-20 MG/5ML suspension 30 mL  30 mL Oral Q4H PRN Shuvon Rankin, NP      . hydrOXYzine (ATARAX/VISTARIL) tablet 100 mg  100 mg Oral QHS PRN Nanine Means, NP      . hydrOXYzine (ATARAX/VISTARIL) tablet 50 mg  50 mg Oral QID PRN Jorje Guild, PA-C   50 mg at 05/04/12 2121  . ibuprofen (ADVIL,MOTRIN) tablet 600 mg  600 mg Oral Q6H PRN Sanjuana Kava, NP   600 mg at 05/05/12 0801  . lamoTRIgine (LAMICTAL) tablet 100 mg  100 mg Oral BID Sanjuana Kava, NP   100 mg at 05/05/12 0801  . magnesium hydroxide (MILK OF MAGNESIA) suspension 30 mL  30 mL Oral Daily PRN Shuvon Rankin, NP      . risperiDONE (RISPERDAL) tablet 1 mg  1 mg Oral BID Sanjuana Kava, NP   1 mg at 05/05/12 0801  . [DISCONTINUED] traZODone (DESYREL) tablet 50 mg  50 mg Oral QHS,MR X 1 Shuvon Rankin, NP        Lab Results: No results found for this or any previous visit (from the past 48 hour(s)).  Physical Findings: AIMS: Facial and Oral Movements Muscles of Facial Expression: None, normal Lips and Perioral Area: None, normal Jaw: None, normal Tongue: None, normal,Extremity Movements Upper (arms, wrists, hands, fingers): None, normal Lower (legs, knees, ankles, toes): None, normal, Trunk Movements Neck, shoulders, hips: None, normal, Overall Severity Severity of  abnormal movements (highest score from questions above): None, normal Incapacitation due to abnormal movements: None, normal Patient's awareness of abnormal movements (rate only patient's report): No Awareness, Dental Status Current problems with teeth and/or dentures?: No Does patient usually wear dentures?: No  CIWA:  CIWA-Ar Total: 0  COWS:  COWS Total Score: 1   Treatment Plan Summary: Daily contact with patient to assess and evaluate symptoms and progress in treatment Medication management  Plan: Continue current plan of care. Consider increasing Lamictal dosage over next 2 days.  Medical  Decision Making Problem Points:  Established problem, worsening (2), Review of last therapy session (1) and Review of psycho-social stressors (1) Data Points:  Review of medication regiment & side effects (2)  I certify that inpatient services furnished can reasonably be expected to improve the patient's condition.   Rachel Samples 05/05/2012, 1:46 PM

## 2012-05-05 NOTE — Progress Notes (Signed)
[facility] Group Notes:  (Counselor/Nursing/MHT/Case Management/Adjunct)  05/05/2012 4:36 PM  Type of Therapy:  Psychoeducational Skills  Participation Level:  Active  Participation Quality:  Intrusive, Redirectable, Resistant and Sharing  Affect:  Appropriate  Cognitive:  Oriented  Insight:  Limited  Engagement in Group:  Engaged  Engagement in Therapy:  n/a  Modes of Intervention:  Activity, Discussion, Education, Limit-setting, Problem-solving, Socialization and Support  Summary of Progress/Problems: "[name]" attended psychoeducational group on labels. [name] viewed a video depicting different ways labels are used to change perceptions. [name] was active but oppisitional to ideas presented while group discussed what labels are, how we use them, how they effect they way we think about and perceive the world, and listed positive  and negative labels they have used or been called. [name] was given a homework assignment to list 10 words *he has been labeled and find the reality of the situation/label.   [name] 05/05/2012, 4:36 PM

## 2012-05-05 NOTE — Progress Notes (Signed)
D: Patient appropriate and cooperative with staff. Patient's mood anxious. He reported on self inventory sheet that his appetite is improving, energy level is low, and ability to pay attention is poor. Patient rated depression and feelings of hopelessness "10".   A: Support and encouragement provided to patient. Scheduled medications administered per MD orders. Monitor Q15 minute checks for safety.  R: Patient receptive. Passive SI/AH, but contracts for safety. Denies HI/VH. Patient remains safe on the unit.

## 2012-05-06 DIAGNOSIS — F3160 Bipolar disorder, current episode mixed, unspecified: Principal | ICD-10-CM

## 2012-05-06 NOTE — Progress Notes (Signed)
D: Patient pleasant and cooperative with staff and peers. Patient's mood is anxious. He reported on self inventory sheet that his sleep is fair, improving appetite and ability to pay attention, and normal energy level.  A: Administered scheduled medications per MD orders. Support and encouragement provided to patient. Maintain Q15 minute checks for safety.  R: Patient receptive. Passive SI and AH, but contracts for safety. Denies HI/VH. Patient remains safe.

## 2012-05-06 NOTE — Social Work (Signed)
[facility] LCSW Aftercare Discharge Planning Group Note  05/06/2012  8:45 AM  Participation Quality:  Appropriate, Attentive and Sharing  Affect:  Anxious, Blunted and Depressed  Cognitive:  Oriented  Insight:  Developing/Improving  Engagement in Group:  Developing/Improving  Modes of Intervention:  Discussion, Exploration and Support  Summary of Progress/Problems: Pt attended discharge planning group and actively participated in group.  CSW provided pt with today's workbook. Patient reports he slept better last night due to increase in Vistaril dosage, but does not feel his meds are yet stable. Patient rates himself in 8 across-the-board for depression, anxiety, helplessness, hopelessness and says he continues to have suicidal ideations but can contract for safety. Patient reports he may be ready to leave soon if he can get of bed at [facility] and says he will discuss this with M.D. today.    [facility] LCSW Group Therapy  05/06/2012 1:15 PM  Type of Therapy:  Group Therapy/mental health association  Participation Level:  Minimal  Participation Quality:  Drowsy and Sharing  Affect:  Flat  Cognitive:  Appropriate  Insight:  Improving  Engagement in Therapy:  Lacking  Modes of Intervention:  Discussion, Education and Support  Summary of Progress/Problems: Patient was drowsy and had difficulty staying awake during mental health association presentation on recovery and services provided. 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   Patton Salles LCSW 05/06/2012 7:05 AM

## 2012-05-06 NOTE — Progress Notes (Signed)
Patient ID: [name], male   DOB: [birth year], 51 y.o.   MRN: [id number] [facility] MD Progress Note  05/06/2012 3:21 PM [name]  MRN:  [id number]  Subjective: "I'm sleeping real good. Still having bad thoughts, they come and go. I feel suicidal, definitely with plans. My mother does not want me to die, but I want to die. I want to be discharged so that I can go out there and kill myself in front of this hospital. This way, my mother can sue this hospital and become reach eventually on my behalf. That means I will not die in vein. I have caused her a lot of grief, this way, I can pay her back.   I'm angry, frustrated, tired and I'm ready to die.  This is a white world, they think that I can't do nothing about it"  Diagnosis:   Axis I: Bipolar, mixed Axis II: Deferred Axis III:  Past Medical History  Diagnosis Date  . Depression   . Arthritis   . Bipolar 1 disorder    Axis IV: economic problems, housing problems and occupational problems Axis V: 51-60 moderate symptoms  ADL's:  Intact  Sleep: Fair  Appetite:  Fair  Psychiatric Specialty Exam: Review of Systems  Constitutional: Negative.   HENT: Negative.   Eyes: Negative.   Respiratory: Negative.   Cardiovascular: Negative.   Gastrointestinal: Negative.   Genitourinary: Negative.   Musculoskeletal: Negative.   Skin: Negative.   Neurological: Negative.   Endo/Heme/Allergies: Negative.   Psychiatric/Behavioral: Positive for depression (Being stabilized with medication), suicidal ideas, hallucinations ("but it slowing down") and substance abuse (Hx of).    Blood pressure 122/75, pulse 94, temperature 98 F (36.7 C), temperature source Oral, resp. rate 18, height 6\' 2"  (1.88 m), weight 112.946 kg (249 lb).Body mass index is 31.97 kg/(m^2).  General Appearance: Casual  Eye Contact::  Fair  Speech:  Pressured  Volume:  Increased  Mood:  Depressed and Dysphoric  Affect:  Flat  Thought Process:  Coherent   Orientation:  Full (Time, Place, and Person)  Thought Content:  WDL  Suicidal Thoughts:  Yes.  without intent/plan  Homicidal Thoughts:  No  Memory:  Immediate;   Fair Recent;   Fair Remote;   Fair  Judgement:  Fair  Insight:  Shallow  Psychomotor Activity:  Normal  Concentration:  Fair  Recall:  Fair  Akathisia:  No  Handed:  Right  AIMS (if indicated):     Assets:  Communication Skills  Sleep:  Number of Hours: 4.25    Current Medications: Current Facility-Administered Medications  Medication Dose Route Frequency Provider Last Rate Last Dose  . acetaminophen (TYLENOL) tablet 650 mg  650 mg Oral Q6H PRN Shuvon Rankin, NP   650 mg at 04/26/12 0916  . alum & mag hydroxide-simeth (MAALOX/MYLANTA) 200-200-20 MG/5ML suspension 30 mL  30 mL Oral Q4H PRN Shuvon Rankin, NP      . hydrOXYzine (ATARAX/VISTARIL) tablet 100 mg  100 mg Oral QHS PRN Nanine Means, NP   100 mg at 05/05/12 2112  . hydrOXYzine (ATARAX/VISTARIL) tablet 50 mg  50 mg Oral QID PRN Jorje Guild, PA-C   50 mg at 05/05/12 1555  . ibuprofen (ADVIL,MOTRIN) tablet 600 mg  600 mg Oral Q6H PRN Sanjuana Kava, NP   600 mg at 05/06/12 0739  . lamoTRIgine (LAMICTAL) tablet 100 mg  100 mg Oral BID Sanjuana Kava, NP   100 mg at 05/06/12 0738  . magnesium hydroxide (MILK OF  MAGNESIA) suspension 30 mL  30 mL Oral Daily PRN Shuvon Rankin, NP      . risperiDONE (RISPERDAL) tablet 1 mg  1 mg Oral BID Sanjuana Kava, NP   1 mg at 05/06/12 1914    Lab Results: No results found for this or any previous visit (from the past 48 hour(s)).  Physical Findings: AIMS: Facial and Oral Movements Muscles of Facial Expression: None, normal Lips and Perioral Area: None, normal Jaw: None, normal Tongue: None, normal,Extremity Movements Upper (arms, wrists, hands, fingers): None, normal Lower (legs, knees, ankles, toes): None, normal, Trunk Movements Neck, shoulders, hips: None, normal, Overall Severity Severity of abnormal movements (highest score  from questions above): None, normal Incapacitation due to abnormal movements: None, normal Patient's awareness of abnormal movements (rate only patient's report): No Awareness, Dental Status Current problems with teeth and/or dentures?: No Does patient usually wear dentures?: No  CIWA:  CIWA-Ar Total: 0  COWS:  COWS Total Score: 1   Treatment Plan Summary: Daily contact with patient to assess and evaluate symptoms and progress in treatment Medication management  Plan: Continue current plan of care. Consider increasing Lamictal dosage over next 1 day.  Medical Decision Making Problem Points:  Established problem, worsening (2), Review of last therapy session (1) and Review of psycho-social stressors (1) Data Points:  Review of medication regiment & side effects (2)  I certify that inpatient services furnished can reasonably be expected to improve the patient's condition.   [name] 05/06/2012, 3:21 PM

## 2012-05-06 NOTE — Progress Notes (Signed)
Psychoeducational Group Note  Date:  05/06/2012 Time:  2000   Group Topic/Focus:  Karaoke  Participation Level:  Active  Participation Quality:  Attentive and Supportive  Affect:  Excited  Cognitive:  Alert  Insight:  Improving  Engagement in Group:  Engaged  Additional Comments:    Humberto Seals Monique 05/06/2012, 10:44 PM

## 2012-05-06 NOTE — Progress Notes (Signed)
\  Psychoeducational Group Note  Date:  05/06/2012 Time:  1000  Group Topic/Focus:  Overcoming Stress:   The focus of this group is to define stress and help patients assess their triggers.  Participation Level:  Active  Participation Quality:  Appropriate, Attentive, Monopolizing and Sharing  Affect:  Appropriate and Excited  Cognitive:  Appropriate  Insight:  Monopolizing  Engagement in Group:  Developing/Improving  Additional Comments:  Coye participated in overcoming stress group. Patient defined stress in own terms. Patient completed a stress interview with a partner within the group. Discussed what stresses patient most, and if stress makes you angry or nervous and what happens when you feel stress out. Patient then completed managing stress ideas such as music, television, exercising, etc. Patient was appropriate and cooperative and shared during group.       Karleen Hampshire Brittini 05/06/2012, 1:51 PM

## 2012-05-06 NOTE — Progress Notes (Signed)
Psychoeducational Group Note  Date:  05/06/2012 Time:  1100  Group Topic/Focus:  Wellness Toolbox:   The focus of this group is to discuss various aspects of wellness, balancing those aspects and exploring ways to increase the ability to experience wellness.  Patients will create a wellness toolbox for use upon discharge.  Participation Level:  Active  Participation Quality:  Appropriate, Attentive, Sharing and Supportive  Affect:  Appropriate  Cognitive:  Alert and Appropriate  Insight:  Supportive  Engagement in Group:  Supportive  Additional Comments:  Productive group  Earline Mayotte 05/06/2012, 4:28 PM

## 2012-05-07 MED ORDER — LAMOTRIGINE 100 MG PO TABS: 100.0000 mg | ORAL_TABLET | Freq: Two times a day (BID) | ORAL | Status: DC

## 2012-05-07 MED ORDER — HYDROXYZINE HCL 50 MG PO TABS: ORAL_TABLET | ORAL | Status: DC

## 2012-05-07 MED ORDER — IBUPROFEN 200 MG PO TABS: 400.0000 mg | ORAL_TABLET | Freq: Four times a day (QID) | ORAL | Status: DC | PRN

## 2012-05-07 MED ORDER — RISPERIDONE 1 MG PO TABS: 1.0000 mg | ORAL_TABLET | Freq: Two times a day (BID) | ORAL | Status: DC

## 2012-05-07 NOTE — Social Work (Signed)
[facility] LCSW Aftercare Discharge Planning Group Note  05/07/2012  8:45 AM  Participation Quality:  Appropriate  Affect:  Appropriate and Excited  Cognitive:  Appropriate  Insight:  Engaged  Engagement in Group:  Engaged  Modes of Intervention:  Discussion, Exploration and Support  Summary of Progress/Problems: Pt attended discharge planning group and actively participated in group.  CSW provided pt with today's workbook.  Suicide education and prevention were discussed in today's group. Patient reports he slept much better last night and says he is ready for safe transition to [facility] where he is excited about getting further treatment. Patient reports he is never been to a substance abuse treatment center before and feels excited about going. Patient continues to rate himself high for depression and anxiety the says he will be able to contract for safety as long as he is in a treatment facility.   [facility] LCSW Group Therapy  05/07/2012 1:15 PM  Type of Therapy:  Group Therapy  Participation Level:  Active  Participation Quality:  Attentive, Sharing and Supportive  Affect:  Anxious  Cognitive:  Appropriate  Insight:  Engaged  Engagement in Therapy:  Engaged  Modes of Intervention:  Discussion, Education and Support  Summary of Progress/Problems: Patient participated well in group session focused on recovery and relapse prevention. Patient reports relapse to him means that he has become self-destructive and starts to use drugs which always leaves to him "starting my suicidal mission". Patient reports that he fears that his family doesn't trust him and says when they constantly bring up his past it causes him to use again. Patient was cautioned about use and his family as an excuse to return drug use if he ever expected them to trust him again. Patient is excited about going to residential treatment [facility] where he can further work on his recovery.   Patton Salles LCSW 05/07/2012 6:53  AM

## 2012-05-07 NOTE — Progress Notes (Signed)
D   Pt has been appropriate and pleasant   He said he was started on a new medication so the doctor was not going to discharge him yet   He attends and participates in groups   He interacts well with others   He denies suicidal and homicidal ideation  And is compliant with treatment A   Verbal support given  Medications administered and effectiveness monitored   Q 15 min checks R   Pt safe at present

## 2012-05-07 NOTE — [facility] Suicide Risk Assessment (Signed)
Suicide Risk Assessment  Discharge Assessment     Demographic Factors:  Male, Low socioeconomic status, Living alone and Unemployed  Mental Status Per Nursing Assessment::   On Admission:  Self-harm thoughts  Current Mental Status by Physician: Patient alert and oriented to 4. Denies AH/VH/HI/SI.  Loss Factors: Decrease in vocational status and Financial problems/change in socioeconomic status  Historical Factors: Impulsivity  Risk Reduction Factors:   Sense of responsibility to family and Positive social support  Continued Clinical Symptoms:  Improved  Cognitive Features That Contribute To Risk:  Cognitively intact  Suicide Risk:  Minimal: No identifiable suicidal ideation.  Patients presenting with no risk factors but with morbid ruminations; may be classified as minimal risk based on the severity of the depressive symptoms  Discharge Diagnoses:   AXIS I:  Bipolar, mixed AXIS II:  Deferred AXIS III:   Past Medical History  Diagnosis Date  . Depression   . Arthritis   . Bipolar 1 disorder    AXIS IV:  economic problems and housing problems AXIS V:  61-70 mild symptoms  Plan Of Care/Follow-up recommendations:  Activity:  normal Diet:  normal  Is patient on multiple antipsychotic therapies at discharge:  No   Has Patient had three or more failed trials of antipsychotic monotherapy by history:  No  Recommended Plan for Multiple Antipsychotic Therapies: NA  [name] 05/07/2012, 11:29 AM

## 2012-05-07 NOTE — Discharge Summary (Signed)
Physician Discharge Summary Note  Patient:  [name] is an 51 y.o., male MRN:  [id number] DOB:  04/04/1961 Patient phone:  [phone] (home)  Patient address:   [address] [place] [address],   Date of Admission:  04/25/2012  Date of Discharge: 05/08/11  Reason for Admission:  Bipolar Disorder I, Most Recent Episode Mixed, Severe Without Psychotic Features  Discharge Diagnoses: Active Problems:  * No active hospital problems. *   Review of Systems  Constitutional: Negative.   HENT: Negative.   Eyes: Negative.   Cardiovascular: Negative.   Gastrointestinal: Negative.   Genitourinary: Negative.   Musculoskeletal: Negative.   Skin: Negative.   Neurological: Negative.   Endo/Heme/Allergies: Negative.   Psychiatric/Behavioral: Positive for depression (Stabilized with medication upon discharge.), suicidal ideas (Admantly denies SI upon discharge.) and hallucinations (Denies upon discharge).   Axis Diagnosis:   AXIS I:  Bipolar disorder, current episode manic severe with psychotic features AXIS II:  Deferred AXIS III:   Past Medical History  Diagnosis Date  . Depression   . Arthritis   . Bipolar 1 disorder    AXIS IV:  economic problems, housing problems, occupational problems and other psychosocial or environmental problems AXIS V:  60  Level of Care:  [facility]  Hospital Course:  [name] is an 51 y.o.AA male who presented with SI, With plan to jump in front of a car, cutting behaviors and increased depression. [facility] was [facility] on 11/30 and he reports going to a hotel and using cocaine. Not clear on why he did not go to his family after discharge. Pt stated he recently moved back from [place] and that his family doesn't know he is here. Pt was released from [facility] yesterday 11/30. Pt was recently prescribed Lamictal and states it had not had time to start working. Pt endorses using cocaine and ETOH 11/30 and stepping out in front of a car.   After  admission assessment and evaluation, [name] was restarted on his medications that he was given when he discharged on 04/24/12. He was also enrolled in group counseling sessions and activities. He participated on some group sessions, and at times will refuse to attend all together. He stay here comprised of patient rumination over and over his regrets in all that put his family through. He regrets that because of his past behaviors and substance abuse issues, his mother has adamant about him not returning home to her.   [name] did continue to present with suicidal ideations and threats on daily basis. At a point, he was asking to be discharged so that he can go out and kill himself in front of this hospital. He wanted to do this because this will allow his mother who is a retired Clinical research associate to sue this hospital for damages and wrongful death. This will help his mother make money off of his death and become rich. This way, he will be rest assured that he did not die in vein. Patient did complain that he has tried every psychotropic medication there is out there, and nothing has actually helped him. He did not believe that whatever we are trying to give him here will him either.  Besides treating patient for his bipolar affective disorder, his substance abuse issues were also addressed. Patient was instructed and encouraged to enroll in a Residential facility for substance abuse treatment. Patient eventually agreed to go to [facility]. He is currently being discharged to [facility] to continue the next phase of  his psychiatric treatment. He was provided with 2 weeks worth samples of his discharge medication.   Upon discharge to [facility], patient adamantly denies suicidal, homicidal ideations, auditory/visual hallucinations and or delusional thinking. He left [facility] with all personal belongings to [facility] in no apparent distress. [facility] provided transportation.  Consults:  None  Significant Diagnostic Studies:  labs: CBC with, CMP,  Toxicology tests, UDS  Discharge Vitals:   Blood pressure 149/96, pulse 78, temperature 98.2 F (36.8 C), temperature source Oral, resp. rate 16, height 6\' 2"  (1.88 m), weight 112.946 kg (249 lb). Body mass index is 31.97 kg/(m^2). Lab Results:   No results found for this or any previous visit (from the past 72 hour(s)).  Physical Findings: AIMS: Facial and Oral Movements Muscles of Facial Expression: None, normal Lips and Perioral Area: None, normal Jaw: None, normal Tongue: None, normal,Extremity Movements Upper (arms, wrists, hands, fingers): None, normal Lower (legs, knees, ankles, toes): None, normal, Trunk Movements Neck, shoulders, hips: None, normal, Overall Severity Severity of abnormal movements (highest score from questions above): None, normal Incapacitation due to abnormal movements: None, normal Patient's awareness of abnormal movements (rate only patient's report): No Awareness, Dental Status Current problems with teeth and/or dentures?: No Does patient usually wear dentures?: No  CIWA:  CIWA-Ar Total: 0  COWS:  COWS Total Score: 1   Psychiatric Specialty Exam: See Psychiatric Specialty Exam and Suicide Risk Assessment completed by Attending Physician prior to discharge.  Discharge destination:  [facility]  Is patient on multiple antipsychotic therapies at discharge:  No   Has Patient had three or more failed trials of antipsychotic monotherapy by history:  No  Recommended Plan for Multiple Antipsychotic Therapies: NA     Medication List     As of 05/10/2012  1:18 PM    STOP taking these medications         traZODone 50 MG tablet   Commonly known as: DESYREL      TAKE these medications      Indication    hydrOXYzine 50 MG tablet   Commonly known as: ATARAX/VISTARIL   Take 50 mg 4 times daily for anxiety, and 100 mg at bedtime for sleep       ibuprofen 200 MG tablet   Commonly known as: ADVIL,MOTRIN   Take 2 tablets (400 mg total) by mouth every 6  (six) hours as needed. For pain control       lamoTRIgine 100 MG tablet   Commonly known as: LAMICTAL   Take 1 tablet (100 mg total) by mouth 2 (two) times daily. For mood stabilization    Indication: Manic-Depression      risperiDONE 1 MG tablet   Commonly known as: RISPERDAL   Take 1 tablet (1 mg total) by mouth 2 (two) times daily. For mood control    Indication: Manic-Depression         Follow-up Information    Follow up with [facility]. (Patient discharging to [facility] treatment Center who will pick up patient from the hospital today)    Contact information:   [address] [place] [address] [phone]         Follow-up recommendations:  Activity:  as tolerated Other:  Keep all scheduled follow-up appointments as recommended.    Comments: Take all your medications as prescribed by your mental healthcare provider. Report any adverse effects and or reactions from your medicines to your outpatient provider promptly. Patient is instructed and cautioned to not engage in alcohol and or illegal drug  use while on prescription medicines. In the event of worsening symptoms, patient is instructed to call the crisis hotline, 911 and or go to the nearest ED for appropriate evaluation and treatment of symptoms. Follow-up with your primary care provider for your other medical issues, concerns and or health care needs.     Total Discharge Time:  More than 30 minutes.  SignedArmandina Stammer I 05/10/2012, 1:18 PM

## 2012-05-07 NOTE — Progress Notes (Signed)
Nsg d/c note: Patient cooperative during d/c process. Denies SI/HI. Reviewed all medication and f/u plans and patient verbalized understanding.  All belongins returned and escorted to lobby to care of [facility] representative.

## 2012-05-07 NOTE — Progress Notes (Signed)
[facility] Child/Adolescent Case Management Discharge Plan :  Will you be returning to the same living situation after discharge: No. patient will be going to [facility] At discharge, do you have transportation home?:Yes,  Patient will be transported by [facility] staff members to [facility] Do you have the ability to pay for your medications:Yes,     Release of information consent forms completed and in the chart;  Patient's signature needed at discharge.  Patient to Follow up at: Follow-up Information    Follow up with [facility]. (Patient discharging to [facility] treatment Center who will pick up patient from the hospital today)    Contact information:   [address] [place] [address] [phone]           Patient denies SI/HI:   Yes,  Patient says he would be safe as long as he is in a treatment center.    Safety Planning and Suicide Prevention discussed:  Yes,    Patton Salles LCSW 05/07/2012, 11:10 AM

## 2012-05-07 NOTE — Social Work (Signed)
Interdisciplinary Treatment Plan Update (Adult)  Date:  05/07/2012  Time Reviewed:  6:53 AM    Progress in Treatment: Attending groups:   Yes   Participating in groups:  Yes Taking medication as prescribed:  Yes Tolerating medication:  Yes Family/Significant othe contact made: Contact made with family Patient understands diagnosis:  Yes Discussing patient identified problems/goals with staff: Yes Medical problems stabilized or resolved: Yes Denies suicidal/homicidal ideation: No -  Patient able to contract for safety and says he will be safe as long as he is in a treatment center Issues/concerns per patient self-inventory:  Other:  New problem(s) identified: None  Reason for Continuation of Hospitalization: Patient reports he is ready for discharge to [facility]  Interventions implemented related to continuation of hospitalization:  Medication mgement; safety checks q 15 mins; coping skills development  Additional comments: Patient continues to rate himself high for depression, anxiety, and helplessness and hopelessness but says he feels excited about going to [facility] for extended treatment. Patient reports he is safe as long as he is in a treatment center and says he will be able to contract for safety if he is in [place]. Patient reports he is sleeping much better and is ready to leave hospital today.  Estimated length of stay discharging today  Discharge Plan:  Outpatient follow up scheduled with [facility] residential  New goal(s):  Review of initial/current patient goals per problem list:    1.  Goal(s): Eliminate SI/other thoughts of self harm   Met:  No but contracts for safety as long as he is in a treatment center  Target date: d/c  As evidenced by: Patient will no longer endorse SI/HI or other thoughts of self harm.    2.  Goal (s):Reduce depression/anxiety  Met: No reports he will be safe as long as he is in a treatment center  Target date: d/c  As evidenced by:  Patient will rate symptoms at four or below    3.  Goal(s):.stabilize on meds   Met:  Yes  Target date: d/c  As evidenced by: Patient will report being stabilized on medications - less symptomatic    4.  Goal(s): Refer for outpatient follow up   Met:  Yes  Target date: d/c  As evidenced by: Follow up appointment has been scheduled    Attendees: Patient:   @TD  6:53 AM  Physican:  Patrick North, MD @TD  6:53 AM  Nursing:  Harold Barban, RN  05/07/2012 6:53 AM  Nursing:    @TD  6:53 AM  Clinical Social Worker:  Patton Salles, LCSW @TD  6:53 AM  Other: 05/07/2012 6:53 AM   Other:         05/07/2012 6:53 AM Other:

## 2012-05-11 NOTE — Discharge Summary (Signed)
Reviewed

## 2012-05-11 NOTE — Progress Notes (Signed)
Patient Discharge Instructions:  After Visit Summary (AVS):   Faxed to:  05/11/12 Psychiatric Admission Assessment Note:   Faxed to:  05/11/12 Suicide Risk Assessment - Discharge Assessment:   Faxed to:  05/11/12 Faxed/Sent to the Next Level Care provider:  05/11/12 Faxed to [facility] @ [phone]  Jerelene Redden, 05/11/2012, 3:13 PM

## 2012-06-26 ENCOUNTER — Emergency Department (HOSPITAL_COMMUNITY)
Admission: EM | Admit: 2012-06-26 | Discharge: 2012-06-28 | Disposition: A | Discharge status: Other Acute Inpatient Hospital | Payer: Medicaid Other | Attending: Emergency Medicine | Admitting: Emergency Medicine

## 2012-06-26 ENCOUNTER — Encounter (HOSPITAL_COMMUNITY): Payer: Self-pay | Admitting: Emergency Medicine

## 2012-06-26 DIAGNOSIS — F3289 Other specified depressive episodes: Secondary | ICD-10-CM | POA: Insufficient documentation

## 2012-06-26 DIAGNOSIS — F1410 Cocaine abuse, uncomplicated: Secondary | ICD-10-CM | POA: Insufficient documentation

## 2012-06-26 DIAGNOSIS — R45851 Suicidal ideations: Secondary | ICD-10-CM | POA: Insufficient documentation

## 2012-06-26 DIAGNOSIS — Z9151 Personal history of suicidal behavior: Secondary | ICD-10-CM

## 2012-06-26 DIAGNOSIS — R440 Auditory hallucinations: Secondary | ICD-10-CM

## 2012-06-26 DIAGNOSIS — Z8739 Personal history of other diseases of the musculoskeletal system and connective tissue: Secondary | ICD-10-CM | POA: Insufficient documentation

## 2012-06-26 DIAGNOSIS — Z915 Personal history of self-harm: Secondary | ICD-10-CM

## 2012-06-26 DIAGNOSIS — R443 Hallucinations, unspecified: Secondary | ICD-10-CM | POA: Insufficient documentation

## 2012-06-26 DIAGNOSIS — F329 Major depressive disorder, single episode, unspecified: Secondary | ICD-10-CM | POA: Insufficient documentation

## 2012-06-26 DIAGNOSIS — F319 Bipolar disorder, unspecified: Secondary | ICD-10-CM | POA: Insufficient documentation

## 2012-06-26 DIAGNOSIS — F1010 Alcohol abuse, uncomplicated: Secondary | ICD-10-CM | POA: Insufficient documentation

## 2012-06-26 DIAGNOSIS — F489 Nonpsychotic mental disorder, unspecified: Secondary | ICD-10-CM | POA: Insufficient documentation

## 2012-06-26 DIAGNOSIS — Z79899 Other long term (current) drug therapy: Secondary | ICD-10-CM | POA: Insufficient documentation

## 2012-06-26 LAB — CBC WITH DIFFERENTIAL/PLATELET
Basophils Absolute: 0 10*3/uL (ref 0.0–0.1)
Basophils Relative: 1 % (ref 0–1)
Hemoglobin: 15.2 g/dL (ref 13.0–17.0)
MCHC: 34.1 g/dL (ref 30.0–36.0)
Neutro Abs: 6.1 10*3/uL (ref 1.7–7.7)
Neutrophils Relative %: 70 % (ref 43–77)
RDW: 12.8 % (ref 11.5–15.5)

## 2012-06-26 LAB — COMPREHENSIVE METABOLIC PANEL
AST: 54 U/L — ABNORMAL HIGH (ref 0–37)
Albumin: 4.4 g/dL (ref 3.5–5.2)
Alkaline Phosphatase: 81 U/L (ref 39–117)
Chloride: 100 mEq/L (ref 96–112)
Potassium: 3.8 mEq/L (ref 3.5–5.1)
Total Bilirubin: 0.7 mg/dL (ref 0.3–1.2)

## 2012-06-26 LAB — RAPID URINE DRUG SCREEN, HOSP PERFORMED
Barbiturates: NOT DETECTED
Cocaine: POSITIVE — AB
Tetrahydrocannabinol: NOT DETECTED

## 2012-06-26 MED ORDER — ONDANSETRON 4 MG PO TBDP
4.0000 mg | ORAL_TABLET | Freq: Once | ORAL | Status: AC
  Administered 2012-06-26: 4 mg via ORAL
  Filled 2012-06-26: qty 1

## 2012-06-26 MED ORDER — FOLIC ACID 1 MG PO TABS
1.0000 mg | ORAL_TABLET | Freq: Every day | ORAL | Status: DC
  Administered 2012-06-27 – 2012-06-28 (×2): 1 mg via ORAL
  Filled 2012-06-26 (×2): qty 1

## 2012-06-26 MED ORDER — LORAZEPAM 1 MG PO TABS: 1.0000 mg | ORAL_TABLET | Freq: Four times a day (QID) | ORAL | Status: DC | PRN

## 2012-06-26 MED ORDER — VITAMIN B-1 100 MG PO TABS
100.0000 mg | ORAL_TABLET | Freq: Every day | ORAL | Status: DC
  Administered 2012-06-27 – 2012-06-28 (×2): 100 mg via ORAL
  Filled 2012-06-26 (×2): qty 1

## 2012-06-26 MED ORDER — THIAMINE HCL 100 MG/ML IJ SOLN: 100.0000 mg | Freq: Every day | INTRAMUSCULAR | Status: DC

## 2012-06-26 MED ORDER — ADULT MULTIVITAMIN W/MINERALS CH
1.0000 | ORAL_TABLET | Freq: Every day | ORAL | Status: DC
  Administered 2012-06-27 – 2012-06-28 (×2): 1 via ORAL
  Filled 2012-06-26 (×2): qty 1

## 2012-06-26 MED ORDER — LORAZEPAM 2 MG/ML IJ SOLN: 1.0000 mg | Freq: Four times a day (QID) | INTRAMUSCULAR | Status: DC | PRN

## 2012-06-26 NOTE — ED Notes (Signed)
PT unable to tolerate PO meds at this time dur to nausea. Will give zofran as ordered for nausea and reassess .

## 2012-06-26 NOTE — ED Notes (Signed)
Patient states that he moved to [place] about a month ago from [place] and has not been taking his medications because he is out of them and his medicaid has not been transferred.  Last time he took meds was about three weeks and and his last alcohol drink was yesterday.

## 2012-06-26 NOTE — ED Notes (Signed)
Patient has been wanded by security and is in blue scrubs.  House Coverage has been notified of needing a sitter for patient.

## 2012-06-26 NOTE — ED Notes (Signed)
Ordered dinner from nutrition.

## 2012-06-26 NOTE — ED Notes (Signed)
Pt. Stated, I moved here from [place] and I'm not able to get my meds and I've started drinking and wanting to hurt myself.  I've not had my medicines in 2-3 weeks and I'm thinking I would hurt myself.  I've tried to hurt myself in the past by cutting myself or taking pills.

## 2012-06-26 NOTE — ED Notes (Signed)
Personal belongings in locker 1 and  2

## 2012-06-26 NOTE — ED Provider Notes (Signed)
History     CSN: [id number]  Arrival date & time 06/26/12  9562   First MD Initiated Contact with Patient 06/26/12 1825      No chief complaint on file.   (Consider location/radiation/quality/duration/timing/severity/associated sxs/prior treatment) HPI  [name] is a 52 y.o. male past medical history significant for depression and bipolar complaining of suicidal ideation with a plan. Patient states that he has tried to hurt himself in the past by cutting himself in overdosing on medications. He denies any homicidal ideation. He drinks and does cocaine. Heals reports auditory hallucinations. He reports a voices are telling him to hurt himself and that he will never get better and that the world is going to end. She denies chest pain, palpitations, shortness of breath, abdominal pain, nausea vomiting, change in bowel or bladder habits. Patient does endorse a right leg pain that is chronic for him he states that it is mild and secondary to an arthritis.  Past Medical History  Diagnosis Date  . Depression   . Arthritis   . Bipolar 1 disorder     History reviewed. No pertinent past surgical history.  No family history on file.  History  Substance Use Topics  . Smoking status: Never Smoker   . Smokeless tobacco: Not on file  . Alcohol Use: No      Review of Systems  Constitutional: Negative for fever.  Respiratory: Negative for shortness of breath.   Cardiovascular: Negative for chest pain.  Gastrointestinal: Negative for nausea, vomiting, abdominal pain and diarrhea.  Psychiatric/Behavioral: Positive for suicidal ideas. Negative for self-injury.  All other systems reviewed and are negative.    Allergies  Review of patient's allergies indicates no known allergies.  Home Medications   Current Outpatient Rx  Name  Route  Sig  Dispense  Refill  . DIPHENHYDRAMINE HCL 25 MG PO TABS   Oral   Take 100 mg by mouth at bedtime as needed. For sleep         . HYDROXYZINE  HCL 50 MG PO TABS      Take 50 mg 4 times daily for anxiety, and 100 mg at bedtime for sleep   180 tablet   0   . IBUPROFEN 200 MG PO TABS   Oral   Take 2 tablets (400 mg total) by mouth every 6 (six) hours as needed. For pain control   30 tablet      . LAMOTRIGINE 100 MG PO TABS   Oral   Take 1 tablet (100 mg total) by mouth 2 (two) times daily. For mood stabilization   60 tablet   0   . RISPERIDONE 1 MG PO TABS   Oral   Take 1 tablet (1 mg total) by mouth 2 (two) times daily. For mood control   60 tablet   0     BP 123/77  Pulse 110  Temp 97.7 F (36.5 C) (Oral)  Resp 16  SpO2 95%  Physical Exam  Nursing note and vitals reviewed. Constitutional: He is oriented to person, place, and time. He appears well-developed and well-nourished. No distress.  HENT:  Head: Normocephalic and atraumatic.  Right Ear: External ear normal.  Left Ear: External ear normal.  Mouth/Throat: Oropharynx is clear and moist.  Eyes: Conjunctivae normal and EOM are normal. Pupils are equal, round, and reactive to light.  Neck: Normal range of motion. Neck supple.  Cardiovascular: Normal rate, regular rhythm, normal heart sounds and intact distal pulses.   Pulmonary/Chest: Effort normal  and breath sounds normal. No stridor. No respiratory distress. He has no wheezes. He has no rales. He exhibits no tenderness.  Abdominal: Soft. Bowel sounds are normal. He exhibits no distension and no mass. There is no tenderness. There is no rebound and no guarding.  Musculoskeletal: Normal range of motion.  Neurological: He is alert and oriented to person, place, and time.       This out of 5x4 extremities, distal sensation is grossly intact, cranial nerves II through XII intact, patient ambulates with a  to a coordinated gait  Psychiatric: His mood appears anxious. His affect is labile. His speech is rapid and/or pressured. He is actively hallucinating. Thought content is not paranoid and not delusional. He  expresses suicidal ideation. He expresses no homicidal ideation. He expresses suicidal plans. He expresses no homicidal plans.    ED Course  Procedures (including critical care time)  Labs Reviewed  COMPREHENSIVE METABOLIC PANEL - Abnormal; Notable for the following:    BUN 26 (*)     AST 54 (*)     GFR calc non Af Amer 73 (*)     GFR calc Af Amer 84 (*)     All other components within normal limits  URINE RAPID DRUG SCREEN (HOSP PERFORMED) - Abnormal; Notable for the following:    Cocaine POSITIVE (*)     All other components within normal limits  ETHANOL - Abnormal; Notable for the following:    Alcohol, Ethyl (B) 97 (*)     All other components within normal limits  CBC WITH DIFFERENTIAL   No results found.   1. Suicidal ideation   2. History of suicide attempt   3. Bipolar disorder   4. Auditory hallucinations       MDM   Patient with history of bipolar disorder complaining of suicidal ideation with a plan and prior attempts. Patient is also having auditory hallucinations. He's been off of his medication for several months. She has no physical complaints and bloodwork shows no acute abnormalities. I will start him on a CIWA protocol. Patient's also positive for cocaine by urine drug screen.  Patient is medically cleared for psychiatric evaluation.  Tele psych initiated and act team consulted, Berna Spare will evaluate the patient.           Wynetta Emery, PA-C 06/26/12 2300

## 2012-06-27 NOTE — [facility] Counselor (Signed)
Per Shalita Forrest, AC no beds currently available at [facility]. Shuvon Rankin, FNP reviewed clinical information and accepted Pt pending bed availability.  Jalani Cullifer Ellis Wylie Russon Jr, LPC, NCC Assessment Counselor 

## 2012-06-27 NOTE — BH Assessment (Signed)
Assessment Note   [name] is an 52 y.o. male. Reassessment of pt awaiting inpt psychiatric treatment.  Pt reports he has been living in [place] and returned to [facility] in late December 2013.  Pt reports he was hospitalized at [facility] in 05/2012 but his medicaid has been cut off and once discharged he ran out of meds.  Pt reports he has been off psych meds for 2-3 weeks, reports he is depressed, "hearing stuff" and suicidal.  Pt reports he hears demons telling him to kill self and calling him a loser.  Reports visual hallucinations: saw his deceased grandmother.  Hallucinations occurred yesterday.  Pt denies HI.  Pt does report SI with plan to "cut myself."  Pt also reports binge alcohol and sporadic cocaine use.    Axis I: Bipolar, Depressed and Substance Abuse Axis II: Deferred Axis III:  Past Medical History  Diagnosis Date  . Depression   . Arthritis   . Bipolar 1 disorder    Axis IV: problems with primary support group Axis V: 31-40 impairment in reality testing  Past Medical History:  Past Medical History  Diagnosis Date  . Depression   . Arthritis   . Bipolar 1 disorder     History reviewed. No pertinent past surgical history.  Family History: No family history on file.  Social History:  reports that he has never smoked. He does not have any smokeless tobacco history on file. He reports that he uses illicit drugs (Cocaine). He reports that he does not drink alcohol.  Additional Social History:  Alcohol / Drug Use Pain Medications: Pt denies Prescriptions: Pt denies Over the Counter: Pt denies History of alcohol / drug use?: Yes Longest period of sobriety (when/how long): none recent Negative Consequences of Use: Financial;Personal relationships;Work / Programmer, multimedia Withdrawal Symptoms: Nausea / Vomiting;Sweats Substance #1 Name of Substance 1: alcohol 1 - Age of First Use: early 34's 1 - Amount (size/oz): 18 beers 1 - Frequency: binges twice a week 1 - Duration:  On-going 1 - Last Use / Amount: 2/1, 18 beers Substance #2 Name of Substance 2: cocaine 2 - Age of First Use: early 68's 2 - Amount (size/oz): $25 2 - Frequency: < 1x per month 2 - Duration: On-going 2 - Last Use / Amount: 1 week ago, $25  CIWA: CIWA-Ar BP: 122/63 mmHg Pulse Rate: 86  Nausea and Vomiting: no nausea and no vomiting Tactile Disturbances: none Tremor: no tremor Auditory Disturbances: not present Paroxysmal Sweats: barely perceptible sweating, palms moist Visual Disturbances: mild sensitivity Anxiety: no anxiety, at ease Headache, Fullness in Head: very mild Agitation: normal activity Orientation and Clouding of Sensorium: oriented and can do serial additions CIWA-Ar Total: 4  COWS:    Allergies: No Known Allergies  Home Medications:  (Not in a hospital admission)  OB/GYN Status:  No LMP for male patient.  General Assessment Data Location of Assessment: [facility] ED ACT Assessment: Yes Living Arrangements: Parent Can pt return to current living arrangement?: Yes Admission Status: Voluntary Is patient capable of signing voluntary admission?: Yes Transfer from: Acute Hospital Referral Source: Self/Family/Friend     Risk to self Suicidal Ideation: Yes-Currently Present Suicidal Intent: No Is patient at risk for suicide?: Yes Suicidal Plan?: Yes-Currently Present Specify Current Suicidal Plan: "cut myself. Access to Means: Yes Specify Access to Suicidal Means: knives What has been your use of drugs/alcohol within the last 12 months?: current use Previous Attempts/Gestures: Yes How many times?: 3  Other Self Harm Risks: Hx of  cutting Triggers for Past Attempts: Other (Comment) (relationship problems, alcohol) Intentional Self Injurious Behavior: Cutting Comment - Self Injurious Behavior: most recent episode 1 year ago Family Suicide History: No Recent stressful life event(s): Conflict (Comment) (relationship problems, living with mother) Persecutory  voices/beliefs?: No Depression: Yes Depression Symptoms: Despondent;Insomnia;Isolating;Guilt;Loss of interest in usual pleasures;Feeling worthless/self pity;Feeling angry/irritable Substance abuse history and/or treatment for substance abuse?: Yes Suicide prevention information given to non-admitted patients: Not applicable  Risk to Others Homicidal Ideation: No Thoughts of Harm to Others: No Current Homicidal Intent: No Current Homicidal Plan: No Access to Homicidal Means: No Identified Victim: No one History of harm to others?: No Assessment of Violence: None Noted Violent Behavior Description: None noted Does patient have access to weapons?: No Criminal Charges Pending?: No Does patient have a court date: No  Psychosis Hallucinations: Auditory;Visual;With command (demons telling him to kill self, calling him "loser", ) Delusions: None noted  Mental Status Report Appear/Hygiene: Disheveled Eye Contact: Fair Motor Activity: Unremarkable Speech: Logical/coherent Level of Consciousness: Alert Mood: Depressed;Other (Comment) (cooperative) Affect: Appropriate to circumstance Anxiety Level: None Panic attack frequency: Daily Most recent panic attack: Today Thought Processes: Coherent;Relevant Judgement: Unimpaired Orientation: Person;Place;Time;Situation Obsessive Compulsive Thoughts/Behaviors: None  Cognitive Functioning Concentration: Normal Memory: Recent Intact;Remote Intact IQ: Average Insight: Good Impulse Control: Fair Appetite: Fair Weight Loss: 2  Weight Gain: 0  Sleep: Decreased Total Hours of Sleep: 4  Vegetative Symptoms: None  ADLScreening [facility] Assessment Services) Patient's cognitive ability adequate to safely complete daily activities?: Yes Patient able to express need for assistance with ADLs?: Yes Independently performs ADLs?: Yes (appropriate for developmental age)  Abuse/Neglect [facility]) Physical Abuse: Denies Verbal Abuse: Yes, past  (Comment) Sexual Abuse: Denies  Prior Inpatient Therapy Prior Inpatient Therapy: Yes Prior Therapy Dates: 05/2012 Prior Therapy Facilty/Provider(s): [facility]; [facility], [facility], [facility] Reason for Treatment: SI  Prior Outpatient Therapy Prior Outpatient Therapy: Yes Prior Therapy Dates: 2012-13 Prior Therapy Facilty/Provider(s): [facility] Reason for Treatment: Med mngment  ADL Screening (condition at time of admission) Patient's cognitive ability adequate to safely complete daily activities?: Yes Patient able to express need for assistance with ADLs?: Yes Independently performs ADLs?: Yes (appropriate for developmental age) Weakness of Legs: None Weakness of Arms/Hands: None  Home Assistive Devices/Equipment Home Assistive Devices/Equipment: None    Abuse/Neglect Assessment (Assessment to be complete while patient is alone) Physical Abuse: Denies Verbal Abuse: Yes, past (Comment) Sexual Abuse: Denies Exploitation of patient/patient's resources: Denies Self-Neglect: Denies Values / Beliefs Cultural Requests During Hospitalization: None Spiritual Requests During Hospitalization: None   Advance Directives (For Healthcare) Advance Directive: Patient does not have advance directive;Patient would not like information    Additional Information 1:1 In Past 12 Months?: No CIRT Risk: No Elopement Risk: No Does patient have medical clearance?: Yes     Disposition: Pt accepted at [facility] pending available bed. Disposition Disposition of Patient: Inpatient treatment program Type of inpatient treatment program: Adult Patient referred to:  (Referred to [facility] )  On Site Evaluation by:   Reviewed with Physician:     Lorri Frederick 06/27/2012 9:37 PM
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
## 2012-06-27 NOTE — ED Provider Notes (Signed)
Medical screening examination/treatment/procedure(s) were performed by non-physician practitioner and as supervising physician I was immediately available for consultation/collaboration. Devoria Albe, MD, FACEP   Ward Givens, MD 06/27/12 0001

## 2012-06-28 ENCOUNTER — Encounter (HOSPITAL_COMMUNITY): Payer: Self-pay | Admitting: *Deleted

## 2012-06-28 ENCOUNTER — Inpatient Hospital Stay (HOSPITAL_COMMUNITY)
Admission: AD | Admit: 2012-06-28 | Discharge: 2012-07-07 | DRG: 885 | Disposition: A | Discharge status: Home / Self Care | Payer: Medicaid Other | Source: Intra-hospital | Attending: Psychiatry | Admitting: Psychiatry

## 2012-06-28 DIAGNOSIS — F314 Bipolar disorder, current episode depressed, severe, without psychotic features: Secondary | ICD-10-CM | POA: Diagnosis present

## 2012-06-28 DIAGNOSIS — F1010 Alcohol abuse, uncomplicated: Secondary | ICD-10-CM

## 2012-06-28 DIAGNOSIS — F1020 Alcohol dependence, uncomplicated: Secondary | ICD-10-CM | POA: Diagnosis present

## 2012-06-28 DIAGNOSIS — F312 Bipolar disorder, current episode manic severe with psychotic features: Principal | ICD-10-CM

## 2012-06-28 DIAGNOSIS — F1410 Cocaine abuse, uncomplicated: Secondary | ICD-10-CM

## 2012-06-28 DIAGNOSIS — Z79899 Other long term (current) drug therapy: Secondary | ICD-10-CM

## 2012-06-28 DIAGNOSIS — F1420 Cocaine dependence, uncomplicated: Secondary | ICD-10-CM

## 2012-06-28 MED ORDER — RISPERIDONE 1 MG PO TABS
1.0000 mg | ORAL_TABLET | Freq: Two times a day (BID) | ORAL | Status: DC
  Administered 2012-06-28 – 2012-06-29 (×2): 1 mg via ORAL
  Filled 2012-06-28 (×5): qty 1

## 2012-06-28 MED ORDER — MAGNESIUM HYDROXIDE 400 MG/5ML PO SUSP: 30.0000 mL | Freq: Every day | ORAL | Status: DC | PRN

## 2012-06-28 MED ORDER — IBUPROFEN 400 MG PO TABS
400.0000 mg | ORAL_TABLET | Freq: Four times a day (QID) | ORAL | Status: DC | PRN
  Administered 2012-06-28 – 2012-07-04 (×13): 400 mg via ORAL
  Filled 2012-06-28 (×13): qty 1

## 2012-06-28 MED ORDER — HYDROXYZINE HCL 50 MG PO TABS
50.0000 mg | ORAL_TABLET | Freq: Three times a day (TID) | ORAL | Status: DC | PRN
  Administered 2012-06-28 – 2012-07-01 (×5): 50 mg via ORAL
  Filled 2012-06-28 (×4): qty 1

## 2012-06-28 MED ORDER — ALUM & MAG HYDROXIDE-SIMETH 200-200-20 MG/5ML PO SUSP: 30.0000 mL | ORAL | Status: DC | PRN

## 2012-06-28 MED ORDER — ACETAMINOPHEN 325 MG PO TABS
650.0000 mg | ORAL_TABLET | Freq: Four times a day (QID) | ORAL | Status: DC | PRN
Start: 2012-06-28 — End: 2012-07-07
  Administered 2012-07-03 – 2012-07-05 (×3): 650 mg via ORAL

## 2012-06-28 MED ORDER — DIPHENHYDRAMINE HCL 25 MG PO TABS
50.0000 mg | ORAL_TABLET | Freq: Every evening | ORAL | Status: DC | PRN
  Administered 2012-06-28 – 2012-06-30 (×3): 50 mg via ORAL
  Filled 2012-06-28 (×4): qty 2
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
## 2012-06-28 NOTE — Tx Team (Signed)
Initial Interdisciplinary Treatment Plan  PATIENT STRENGTHS: (choose at least two) Average or above average intelligence Capable of independent living Communication skills General fund of knowledge Motivation for treatment/growth Religious Affiliation Supportive family/friends  PATIENT STRESSORS: Financial difficulties Marital or family conflict Medication change or noncompliance Occupational concerns Substance abuse   PROBLEM LIST: Problem List/Patient Goals Date to be addressed Date deferred Reason deferred Estimated date of resolution  Suicidal ideation 06/28/2012   D/c        Substance abuse 06/28/2012   D/c        depression 06/28/2012   D/c                           DISCHARGE CRITERIA:  Ability to meet basic life and health needs Adequate post-discharge living arrangements Improved stabilization in mood, thinking, and/or behavior Medical problems require only outpatient monitoring Motivation to continue treatment in a less acute level of care Need for constant or close observation no longer present Reduction of life-threatening or endangering symptoms to within safe limits Safe-care adequate arrangements made Verbal commitment to aftercare and medication compliance Withdrawal symptoms are absent or subacute and managed without 24-hour nursing intervention  PRELIMINARY DISCHARGE PLAN: Attend aftercare/continuing care group Attend PHP/IOP Attend 12-step recovery group Outpatient therapy Participate in family therapy Return to previous living arrangement  PATIENT/FAMIILY INVOLVEMENT: This treatment plan has been presented to and reviewed with the patient, Quill Grinder.  The patient and family have been given the opportunity to ask questions and make suggestions.  Earline Mayotte 06/28/2012, 4:16 PM

## 2012-06-28 NOTE — BH Assessment (Signed)
Assessment Note   Update:  Received call from [facility] stating pt accepted to [facility] by Assunta Found, FNP to Dr. Jannifer Franklin to bed 402-1 and that pt could be transported to [facility].  Updated EDP Effie Shy and ED staff.  Updated assessment disposition, completed assessment notification, and faxed to [facility] to log.  Pt to be transported via security to [facility], as pt is voluntary.    Disposition:  Disposition Disposition of Patient: Inpatient treatment program Type of inpatient treatment program: Adult Patient referred to: Other (Comment) (Pt accepted [facility])  On Site Evaluation by:   Reviewed with Physician:  Tressia Danas, Rennis Harding 06/28/2012 12:30 PM

## 2012-06-28 NOTE — BH Assessment (Signed)
Assessment Note   [name] is an 52 y.o. male that was reassessed this day.  Pt in bed, awake, alert, quiet, and cooperative.  Pt stated he has been off of his medications for three weeks "and I am going through it."  Pt recently had his Medicaid cut off and ran out of his meds by report.  Pt admits to SI with intent to harm himself but cutting himself.  Pt unable to contract for safety at this time.  Pt denies HI.  Pt endorses AVH, staing he see shis diead grandmother and hears voices that tell him to hurt himself and call him a "loser."  Pt admits to binge drinking and cocaine use at times.  Pt has been accepted to [facility] by Assunta Found, FNP, pending an available bed at [facility].  Received call from [facility] from Amy @ [phone], and pt was declined there due to his acuity.  This Clinical research associate will continue bed finding for pt.  Completed reassessment, assessment notification, and faxed to [facility] to log.  Updated ED staff.  Previous Notes:  [name] is an 52 y.o. male. Reassessment of pt awaiting inpt psychiatric treatment. Pt reports he has been living in [place] and returned to [facility] in late December 2013. Pt reports he was hospitalized at [facility] in 05/2012 but his medicaid has been cut off and once discharged he ran out of meds. Pt reports he has been off psych meds for 2-3 weeks, reports he is depressed, "hearing stuff" and suicidal. Pt reports he hears demons telling him to kill self and calling him a loser. Reports visual hallucinations: saw his deceased grandmother. Hallucinations occurred yesterday. Pt denies HI. Pt does report SI with plan to "cut myself." Pt also reports binge alcohol and sporadic cocaine use.    Axis I: 296.34 MDD recurrent, severe with psychotic features; 305.00 ETOH abuse Axis II: Deferred Axis III:  Past Medical History  Diagnosis Date  . Depression   . Arthritis   . Bipolar 1 disorder    Axis IV: housing problems, other psychosocial or environmental problems,  problems related to social environment, problems with access to health care services and problems with primary support group Axis V: 21-30 behavior considerably influenced by delusions or hallucinations OR serious impairment in judgment, communication OR inability to function in almost all areas  Past Medical History:  Past Medical History  Diagnosis Date  . Depression   . Arthritis   . Bipolar 1 disorder     History reviewed. No pertinent past surgical history.  Family History: No family history on file.  Social History:  reports that he has never smoked. He does not have any smokeless tobacco history on file. He reports that he uses illicit drugs (Cocaine). He reports that he does not drink alcohol.  Additional Social History:  Alcohol / Drug Use Pain Medications: pt denies Prescriptions: see MAR Over the Counter: see MAR History of alcohol / drug use?: Yes Longest period of sobriety (when/how long): none recent Negative Consequences of Use: Financial;Personal relationships;Work / Programmer, multimedia Withdrawal Symptoms: Nausea / Vomiting;Sweats Substance #1 Name of Substance 1: alcohol 1 - Age of First Use: early 28's 1 - Amount (size/oz): 18 beers 1 - Frequency: binges twice a week 1 - Duration: On-going 1 - Last Use / Amount: 2/1, 18 beers Substance #2 Name of Substance 2: cocaine 2 - Age of First Use: early 20's 2 - Amount (size/oz): $25 2 - Frequency: < 1x per month 2 -  Duration: On-going 2 - Last Use / Amount: 1 week ago, $25  CIWA: CIWA-Ar BP: 102/65 mmHg Pulse Rate: 67  Nausea and Vomiting: no nausea and no vomiting Tactile Disturbances: none Tremor: no tremor Auditory Disturbances: not present Paroxysmal Sweats: barely perceptible sweating, palms moist Visual Disturbances: mild sensitivity Anxiety: no anxiety, at ease Headache, Fullness in Head: very mild Agitation: normal activity Orientation and Clouding of Sensorium: oriented and can do serial additions CIWA-Ar  Total: 4  COWS:    Allergies: No Known Allergies  Home Medications:  (Not in a hospital admission)  OB/GYN Status:  No LMP for male patient.  General Assessment Data Location of Assessment: [facility] ED ACT Assessment: Yes Living Arrangements: Parent Can pt return to current living arrangement?: Yes Admission Status: Voluntary Is patient capable of signing voluntary admission?: Yes Transfer from: Acute Hospital Referral Source: Self/Family/Friend  Education Status Is patient currently in school?: No  Risk to self Suicidal Ideation: Yes-Currently Present Suicidal Intent: Yes-Currently Present Is patient at risk for suicide?: Yes Suicidal Plan?: Yes-Currently Present Specify Current Suicidal Plan: "cut myself" Access to Means: Yes Specify Access to Suicidal Means: knives What has been your use of drugs/alcohol within the last 12 months?: ETOH and cocaine use Previous Attempts/Gestures: Yes How many times?: 3  Other Self Harm Risks: Hx of cutting Triggers for Past Attempts: Other (Comment);Hallucinations;Family contact (relationship problems, SA) Intentional Self Injurious Behavior: Cutting Comment - Self Injurious Behavior: Hx of cutting, none recently Family Suicide History: No Recent stressful life event(s): Conflict (Comment) (relationship problems, living with mother) Persecutory voices/beliefs?: No Depression: Yes Depression Symptoms: Despondent;Insomnia;Isolating;Guilt;Loss of interest in usual pleasures;Feeling worthless/self pity;Feeling angry/irritable Substance abuse history and/or treatment for substance abuse?: Yes Suicide prevention information given to non-admitted patients: Not applicable  Risk to Others Homicidal Ideation: No Thoughts of Harm to Others: No Current Homicidal Intent: No Current Homicidal Plan: No Access to Homicidal Means: No Identified Victim: pt denies History of harm to others?: No Assessment of Violence: None Noted Violent Behavior  Description:  (na - pt calm, cooperative) Does patient have access to weapons?: No Criminal Charges Pending?: No Does patient have a court date: No  Psychosis Hallucinations: Auditory;Visual;With command (demons telling him to harm self, call him "loser, sees dead) Delusions: None noted  Mental Status Report Appear/Hygiene: Disheveled Eye Contact: Fair Motor Activity: Unremarkable Speech: Logical/coherent Level of Consciousness: Quiet/awake Mood: Depressed Affect: Appropriate to circumstance Anxiety Level: None (denies current anxiety, although admits to panic attacks) Panic attack frequency: daily Most recent panic attack: 2 days ago Thought Processes: Coherent;Relevant Judgement: Unimpaired Orientation: Person;Place;Time;Situation Obsessive Compulsive Thoughts/Behaviors: None  Cognitive Functioning Concentration: Normal Memory: Recent Intact;Remote Intact IQ: Average Insight: Fair Impulse Control: Fair Appetite: Fair Weight Loss: 0  Weight Gain: 0  Sleep: Decreased Total Hours of Sleep: 4  Vegetative Symptoms: None  ADLScreening [facility] Assessment [facility]) Patient's cognitive ability adequate to safely complete daily activities?: Yes Patient able to express need for assistance with ADLs?: Yes Independently performs ADLs?: Yes (appropriate for developmental age)  Abuse/Neglect [facility]) Physical Abuse: Denies Verbal Abuse: Denies Sexual Abuse: Denies  Prior Inpatient Therapy Prior Inpatient Therapy: Yes Prior Therapy Dates: 05/2012 Prior Therapy Facilty/Provider(s): [facility]; [facility], [facility] Reason for Treatment: SI  Prior Outpatient Therapy Prior Outpatient Therapy: Yes Prior Therapy Dates: 2012-13 Prior Therapy Facilty/Provider(s): [facility] Reason for Treatment: Med mngment  ADL Screening (condition at time of admission) Patient's cognitive ability adequate to safely complete daily activities?: Yes Patient able to express need for assistance with  ADLs?: Yes Independently performs ADLs?: Yes (appropriate for developmental age) Weakness of Legs: None Weakness of Arms/Hands: None  Home Assistive Devices/Equipment Home Assistive Devices/Equipment: None    Abuse/Neglect Assessment (Assessment to be complete while patient is alone) Physical Abuse: Denies Verbal Abuse: Denies Sexual Abuse: Denies Exploitation of patient/patient's resources: Denies Self-Neglect: Denies Values / Beliefs Cultural Requests During Hospitalization: None Spiritual Requests During Hospitalization: None Consults Spiritual Care Consult Needed: No Social Work Consult Needed: No Merchant navy officer (For Healthcare) Advance Directive: Patient does not have advance directive;Patient would not like information    Additional Information 1:1 In Past 12 Months?: No CIRT Risk: No Elopement Risk: No Does patient have medical clearance?: Yes     Disposition:  Disposition Disposition of Patient: Referred to;Inpatient treatment program Type of inpatient treatment program: Adult Patient referred to: Other (Comment) (Pt accepted [facility] pending available bed)  On Site Evaluation by:   Reviewed with Physician:     Caryl Comes 06/28/2012 9:36 AM

## 2012-06-28 NOTE — ED Provider Notes (Signed)
He has been accepted at the behavioral health hospital  Flint Melter, MD 06/28/12 [phone]

## 2012-06-29 ENCOUNTER — Encounter (HOSPITAL_COMMUNITY): Payer: Self-pay | Admitting: Psychiatry

## 2012-06-29 DIAGNOSIS — F1420 Cocaine dependence, uncomplicated: Secondary | ICD-10-CM

## 2012-06-29 DIAGNOSIS — F1020 Alcohol dependence, uncomplicated: Secondary | ICD-10-CM | POA: Diagnosis present

## 2012-06-29 MED ORDER — LAMOTRIGINE 25 MG PO TABS
25.0000 mg | ORAL_TABLET | Freq: Two times a day (BID) | ORAL | Status: DC
  Administered 2012-06-29 – 2012-07-01 (×4): 25 mg via ORAL
  Filled 2012-06-29 (×6): qty 1

## 2012-06-29 MED ORDER — RISPERIDONE 2 MG PO TABS
2.0000 mg | ORAL_TABLET | Freq: Two times a day (BID) | ORAL | Status: DC
  Administered 2012-06-29 – 2012-07-01 (×4): 2 mg via ORAL
  Filled 2012-06-29 (×6): qty 1

## 2012-06-29 MED ORDER — BENZTROPINE MESYLATE 1 MG PO TABS
1.0000 mg | ORAL_TABLET | Freq: Two times a day (BID) | ORAL | Status: DC
  Administered 2012-06-29 – 2012-07-01 (×4): 1 mg via ORAL
  Filled 2012-06-29 (×6): qty 1

## 2012-06-29 NOTE — Progress Notes (Signed)
[facility] LCSW Aftercare Discharge Planning Group Note  06/29/2012 10:46 AM  Participation Quality:  Attentive  Affect:  Flat  Cognitive:  Oriented  Insight:  Limited  Engagement in Group:  Limited  Modes of Intervention:  Discussion, Exploration and Socialization  Summary of Progress/Problems:  [name] states he ran out of meds as he was in [place], and that he is now SI, depressed and psychotic.  Also admits to drinking.  Lives with mother, and says she was the one who insisted he needed help.  Looking forward to meeting the Dr.  As soon as I was done asking him questions, he abruptly got up and left the group.  [name] 06/29/2012, 10:46 AM

## 2012-06-29 NOTE — Treatment Plan (Signed)
Interdisciplinary Treatment Plan Update (Adult)  Date: 06/29/2012  Time Reviewed: 8:27 AM   Progress in Treatment: Attending groups: Yes Participating in groups: Yes Taking medication as prescribed: Yes Tolerating medication: Yes   Family/Significant other contact made:  No Patient understands diagnosis:  Yes  As evidenced by asking for help with "manic" symptoms, hearing voices and SI Discussing patient identified problems/goals with staff:  Yes  See below Medical problems stabilized or resolved:  Yes Denies suicidal/homicidal ideation: No Issues/concerns per patient self-inventory:  Not filled out Other:  New problem(s) identified: N/A  Reason for Continuation of Hospitalization: Delusions  Hallucinations Medication stabilization Suicidal ideation Other; describe Mood stabilization  Interventions implemented related to continuation of hospitalization: Restart Risperdal and mood stabilizer as determined by Dr  Ernie Avena group attendance and participation  Additional comments:  Estimated length of stay: 3-4 days  Discharge Plan: return home, follow up outpt  New goal(s): N/A  Review of initial/current patient goals per problem list:   1.  Goal(s): Eliminate SI  Met:  No  Target date:2/7  As evidenced ZO:XWRU report  2.  Goal (s):Stabilize mood with the help of medications/therapeutic milieu  Met:  Yes  Target date:2/4  As evidenced by:Although he states his mood is "up and down", Roxy Manns presents in treatment team with no pressured speech, no lability, and his sense of humor is intact  3.  Goal(s): Eliminate psychosis with the help of medications  Met:  No  Target date:2/7  As evidenced by:Roxy Manns insists he is hearing "negative voices"  4.  Goal(s): Identify outpt provider  Met:  No  Target date:2/7  As evidenced EA:[id number] of appointment time and date  Attendees: Patient:  [name]" [place] 06/29/2012 8:27AM  Family:     Physician:   Thedore Mins 06/29/2012 8:27 AM   Nursing:  Joslyn Devon  06/29/2012 8:27 AM   Clinical Social Worker:  Richelle Ito 06/29/2012 8:27 AM   Extender:  Verne Spurr PA 06/29/2012 8:27 AM   Other:     Other:     Other:     Other:      Scribe for Treatment Team:   Ida Rogue, 06/29/2012 8:27 AM

## 2012-06-29 NOTE — [facility]HH Suicide Risk Assessment (Signed)
Suicide Risk Assessment  Admission Assessment     Nursing information obtained from:  Patient Demographic factors:  Male;Low socioeconomic status;Unemployed Current Mental Status:    Loss Factors:    Historical Factors:    Risk Reduction Factors:     CLINICAL FACTORS:   Bipolar Disorder:   Depressive phase Depression:   Anhedonia Comorbid alcohol abuse/dependence Hopelessness Insomnia Currently Psychotic  COGNITIVE FEATURES THAT CONTRIBUTE TO RISK:  Closed-mindedness Polarized thinking    SUICIDE RISK:   Moderate:  Frequent suicidal ideation with limited intensity, and duration, some specificity in terms of plans, no associated intent, good self-control, limited dysphoria/symptomatology, some risk factors present, and identifiable protective factors, including available and accessible social support.  PLAN OF CARE:1. Admit for crisis management and stabilization. 2. Medication management to reduce current symptoms to base line and improve the  patient's overall level of functioning 3. Treat health problems as indicated. 4. Develop treatment plan to decrease risk of relapse upon discharge and the need for readmission. 5. Psycho-social education regarding relapse prevention and self care. 6. Health care follow up as needed for medical problems. 7. Restart home medications where appropriate.    I certify that inpatient services furnished can reasonably be expected to improve the patient's condition.  Hulet Ehrmann,MD 06/29/2012, 10:48 AM

## 2012-06-29 NOTE — Progress Notes (Signed)
Patient ID: [name], male   DOB: [birth year], 53 y.o.   MRN: [id number] Patient has been pleasant and calm today; his behavior has been appropriate.  He states he remains with auditory hallucinations.  Patient met with treatment team today and reports that he stopped taking his medications and his family became concerned over his behavior and moods and brought him to the hospital.  He reports that he knows the importance of keeping his follow up appointments and staying on his medications.  Patient denies any SI/HI.  Continue to assess and monitor medication management.  Safety checks continues every 15 minutes per protocol.    Patient has been attending groups.

## 2012-06-29 NOTE — H&P (Signed)
Psychiatric Admission Assessment Adult  Patient Identification:  [name] Date of Evaluation:  06/29/2012  Chief Complaint:  "I am hearing voices telling me to hurt myself"  History of Present Illness::Patient is a 52 year old man with long history of Bipolar disorder who is well known in [facility] inpatient, he was discharged in December, 2013 and scheduled to follow up at [facility] but never kept his appointment. Instead, he moved out of [place] to [place], [place] to be with his girlfriend. He reports that he came back to [place] after he had an argument with his girlfriend. He states that he has not been taking his medications for the past 3 weeks, instead he has been self medicating with alcohol and cocaine. He presents to the ER yesterday reporting command auditory hallucinations, suicidal ideation with plan, anxiety and depression. Patient is requesting to start back his medications. Elements:  Location:  [facility] inpatient. Quality:  recurrent suicidal, depressive episodes.. Severity:  severe. Timing:  argument with girlfriend and non-compliant with medications. Duration:  mental illness for many years.. Context:  poor medication compliance and substance abuse.. Associated Signs/Synptoms: Depression Symptoms:  depressed mood, anhedonia, psychomotor retardation, difficulty concentrating, hopelessness, suicidal thoughts with specific plan, (Hypo) Manic Symptoms:  Hallucinations, Anxiety Symptoms:  Excessive Worry, Psychotic Symptoms:  Hallucinations: Auditory Visual PTSD Symptoms: Negative  Psychiatric Specialty Exam: Physical Exam  Psychiatric: His speech is normal. His mood appears anxious. He is slowed. Cognition and memory are normal. He expresses impulsivity and inappropriate judgment. He exhibits a depressed mood. He expresses suicidal ideation.    Review of Systems  Constitutional: Negative.   HENT: Negative.   Eyes: Negative.   Respiratory: Negative.   Cardiovascular:  Negative.   Gastrointestinal: Negative.   Genitourinary: Negative.   Musculoskeletal: Positive for joint pain.  Skin: Negative.   Neurological: Negative.   Endo/Heme/Allergies: Negative.   Psychiatric/Behavioral: Positive for depression, suicidal ideas, hallucinations and substance abuse. The patient has insomnia.     Blood pressure 124/76, pulse 83, temperature 98 F (36.7 C), temperature source Oral, resp. rate 18, height 6\' 1"  (1.854 m), weight 108.863 kg (240 lb).Body mass index is 31.66 kg/(m^2).  General Appearance: Fairly Groomed  Patent attorney::  Good  Speech:  Clear and Coherent and Slow  Volume:  Decreased  Mood:  Dysphoric  Affect:  Flat  Thought Process:  Linear and Logical  Orientation:  Full (Time, Place, and Person)  Thought Content:  Hallucinations: Auditory Visual  Suicidal Thoughts:  Yes.  with intent/plan  Homicidal Thoughts:  No  Memory:  Immediate;   Fair Recent;   Fair Remote;   Fair  Judgement:  Poor  Insight:  Shallow  Psychomotor Activity:  Decreased  Concentration:  Fair  Recall:  Fair  Akathisia:  No  Handed:  Right  AIMS (if indicated):     Assets:  Communication Skills Desire for Improvement Social Support  Sleep:  Number of Hours: 5     Past Psychiatric History: Diagnosis:  Hospitalizations:  Outpatient Care:  Substance Abuse Care:  Self-Mutilation:  Suicidal Attempts:  Violent Behaviors:   Past Medical History:   Past Medical History  Diagnosis Date  . Arthritis     Allergies:  No Known Allergies PTA Medications: Prescriptions prior to admission  Medication Sig Dispense Refill  . diphenhydrAMINE (BENADRYL) 25 MG tablet Take 100 mg by mouth at bedtime as needed. For sleep      . hydrOXYzine (ATARAX/VISTARIL) 50 MG tablet Take 50 mg 4 times daily for anxiety, and  100 mg at bedtime for sleep  180 tablet  0  . ibuprofen (ADVIL,MOTRIN) 200 MG tablet Take 2 tablets (400 mg total) by mouth every 6 (six) hours as needed. For pain  control  30 tablet    . lamoTRIgine (LAMICTAL) 100 MG tablet Take 1 tablet (100 mg total) by mouth 2 (two) times daily. For mood stabilization  60 tablet  0  . risperiDONE (RISPERDAL) 1 MG tablet Take 1 tablet (1 mg total) by mouth 2 (two) times daily. For mood control  60 tablet  0    Previous Psychotropic Medications:  Medication/Dose: Risperdal, Lamictal, Vistaril                 Substance Abuse History in the last 12 months:  yes  Consequences of Substance Abuse: depression after drug use.  Social History:  reports that he has never smoked. He does not have any smokeless tobacco history on file. He reports that he drinks about 10.8 ounces of alcohol per week. He reports that he uses illicit drugs (Cocaine). Additional Social History: Pain Medications: ibuprofen Prescriptions: none Over the Counter: ibuprofen History of alcohol / drug use?: Yes Longest period of sobriety (when/how long): one year Negative Consequences of Use: Financial;Personal relationships Withdrawal Symptoms: Other (Comment) (denied withdrawals) Name of Substance 1: alcohol 1 - Age of First Use: early 36's 1 - Amount (size/oz): 18 beers on Friday 1 - Frequency: daily 1 - Duration: on going 1 - Last Use / Amount: 18 beers on Friday Name of Substance 2: cocaine 2 - Age of First Use: early 20's 2 - Amount (size/oz): $20.00 worth cocaine 2 - Frequency: once month 2 - Duration: on going                Current Place of Residence:   Place of Birth:   Family Members: Marital Status:  Single Children:  Sons:  Daughters: Relationships: Education:  [employer] Problems/Performance: Religious Beliefs/Practices: History of Abuse (Emotional/Phsycial/Sexual) Occupational Experiences; Military History:  None. Legal History: Hobbies/Interests:  Family History:  History reviewed. No pertinent family history.  Results for orders placed during the hospital encounter of 06/26/12 (from  the past 72 hour(s))  CBC WITH DIFFERENTIAL     Status: Normal   Collection Time   06/26/12  6:45 PM      Component Value Range Comment   WBC 8.7  4.0 - 10.5 K/uL    RBC 5.08  4.22 - 5.81 MIL/uL    Hemoglobin 15.2  13.0 - 17.0 g/dL    HCT 16.1  09.6 - 04.5 %    MCV 87.8  78.0 - 100.0 fL    MCH 29.9  26.0 - 34.0 pg    MCHC 34.1  30.0 - 36.0 g/dL    RDW 40.9  81.1 - 91.4 %    Platelets 208  150 - 400 K/uL    Neutrophils Relative 70  43 - 77 %    Neutro Abs 6.1  1.7 - 7.7 K/uL    Lymphocytes Relative 23  12 - 46 %    Lymphs Abs 2.0  0.7 - 4.0 K/uL    Monocytes Relative 6  3 - 12 %    Monocytes Absolute 0.5  0.1 - 1.0 K/uL    Eosinophils Relative 1  0 - 5 %    Eosinophils Absolute 0.1  0.0 - 0.7 K/uL    Basophils Relative 1  0 - 1 %    Basophils Absolute 0.0  0.0 - 0.1 K/uL   COMPREHENSIVE METABOLIC PANEL     Status: Abnormal   Collection Time   06/26/12  6:45 PM      Component Value Range Comment   Sodium 138  135 - 145 mEq/L    Potassium 3.8  3.5 - 5.1 mEq/L    Chloride 100  96 - 112 mEq/L    CO2 21  19 - 32 mEq/L    Glucose, Bld 93  70 - 99 mg/dL    BUN 26 (*) 6 - 23 mg/dL    Creatinine, Ser 4.09  0.50 - 1.35 mg/dL    Calcium 9.3  8.4 - 81.1 mg/dL    Total Protein 7.9  6.0 - 8.3 g/dL    Albumin 4.4  3.5 - 5.2 g/dL    AST 54 (*) 0 - 37 U/L    ALT 26  0 - 53 U/L    Alkaline Phosphatase 81  39 - 117 U/L    Total Bilirubin 0.7  0.3 - 1.2 mg/dL    GFR calc non Af Amer 73 (*) >90 mL/min    GFR calc Af Amer 84 (*) >90 mL/min   ETHANOL     Status: Abnormal   Collection Time   06/26/12  6:45 PM      Component Value Range Comment   Alcohol, Ethyl (B) 97 (*) 0 - 11 mg/dL   URINE RAPID DRUG SCREEN (HOSP PERFORMED)     Status: Abnormal   Collection Time   06/26/12  7:27 PM      Component Value Range Comment   Opiates NONE DETECTED  NONE DETECTED    Cocaine POSITIVE (*) NONE DETECTED    Benzodiazepines NONE DETECTED  NONE DETECTED    Amphetamines NONE DETECTED  NONE DETECTED     Tetrahydrocannabinol NONE DETECTED  NONE DETECTED    Barbiturates NONE DETECTED  NONE DETECTED    Psychological Evaluations:  Assessment:   AXIS I:  Bipolar disorder, current episode depressed severe with psychotic features              Alcohol abuse. Cocaine abuse  AXIS II:  Deferred AXIS III:   Past Medical History  Diagnosis Date  . Arthritis    AXIS IV:  economic problems, other psychosocial or environmental problems and problems related to social environment AXIS V:  21-30 behavior considerably influenced by delusions or hallucinations OR serious impairment in judgment, communication OR inability to function in almost all areas  Treatment Plan/Recommendations: 1. Admit for crisis management and stabilization. 2. Medication management to reduce current symptoms to base line and improve the  patient's overall level of functioning 3. Treat health problems as indicated. 4. Develop treatment plan to decrease risk of relapse upon discharge and the need for readmission. 5. Psycho-social education regarding relapse prevention and self care. 6. Health care follow up as needed for medical problems. 7. Restart home medications where appropriate.    Treatment Plan Summary: Daily contact with patient to assess and evaluate symptoms and progress in treatment Medication management Current Medications:  Current Facility-Administered Medications  Medication Dose Route Frequency Provider Last Rate Last Dose  . acetaminophen (TYLENOL) tablet 650 mg  650 mg Oral Q6H PRN Verne Spurr, PA-C      . alum & mag hydroxide-simeth (MAALOX/MYLANTA) 200-200-20 MG/5ML suspension 30 mL  30 mL Oral Q4H PRN Verne Spurr, PA-C      . diphenhydrAMINE (BENADRYL) tablet 50 mg  50 mg Oral QHS PRN Verne Spurr, PA-C  50 mg at 06/28/12 2228  . hydrOXYzine (ATARAX/VISTARIL) tablet 50 mg  50 mg Oral TID PRN Verne Spurr, PA-C   50 mg at 06/28/12 2227  . ibuprofen (ADVIL,MOTRIN) tablet 400 mg  400 mg Oral Q6H PRN  Verne Spurr, PA-C   400 mg at 06/29/12 4098  . magnesium hydroxide (MILK OF MAGNESIA) suspension 30 mL  30 mL Oral Daily PRN Verne Spurr, PA-C      . risperiDONE (RISPERDAL) tablet 1 mg  1 mg Oral BID Verne Spurr, PA-C   1 mg at 06/29/12 1191    Observation Level/Precautions:  routine.  Laboratory:  routine.  Psychotherapy:    Medications:    Consultations:    Discharge Concerns:    Estimated LOS:  Other:     I certify that inpatient services furnished can reasonably be expected to improve the patient's condition.   Erasmus Bistline, Octavia Heir 2/4/201410:49 AM

## 2012-06-29 NOTE — Progress Notes (Signed)
The focus of this group is to help patients review their daily goal of treatment and discuss progress on daily workbooks. Pt attended the evening session and responded to all prompts from the Writer. Pt reported having a good, productive day with respect to his treatment. Pt also reported feeling good about a conversation he had with his doctor today. Pt states the only need this evening was to get a good night's sleep. Pt smiled when speaking, but appeared tired and otherwise quiet throughout group.

## 2012-06-29 NOTE — Progress Notes (Signed)
Pt. Attended wrap up group.  Pt. Was smiling and stating positive thoughts and disappointment in self for needing to return to [facility].  Pt. Has multiple admits to [facility].  Pt. Reports that he was out of state and had not taken his meds for appr. 3 months.  Pt. Reports that he hopes to be out helping other mental health patients in his future.  No reports of pain or discomfort noted this pm.  Pt. Denies SI and HI.

## 2012-06-29 NOTE — Progress Notes (Signed)
Adult Psychoeducational Group Note  Date:  06/29/2012 Time:  0930 Group Topic/Focus:  Recovery Goals:   The focus of this group is to identify appropriate goals for recovery and establish a plan to achieve them.  Participation Level:  Active  Participation Quality:  Appropriate  Affect:  Appropriate  Cognitive:  Oriented  Insight: Improving  Engagement in Group:  Engaged  Modes of Intervention:  Education  Additional Comments:  Pt actively took part in group this morning.  Zayyan Mullen E 06/29/2012, 1:18 PM

## 2012-06-29 NOTE — Progress Notes (Signed)
The focus of this group is to help patients review their daily goal of treatment and discuss progress on daily workbooks. Pt attended the evening group and responded to all discussion prompts. Pt expressed embarrassment at having to return to the hospital again. Writer and RN's in group offered encouragement and told Pt that it sometimes requires multiple hospitalizations to learn to manage an illness. Pt appeared upbeat throughout group, smiling and offering supportive comments to his peers.

## 2012-06-30 DIAGNOSIS — F1410 Cocaine abuse, uncomplicated: Secondary | ICD-10-CM

## 2012-06-30 MED ORDER — LOPERAMIDE HCL 2 MG PO CAPS
2.0000 mg | ORAL_CAPSULE | ORAL | Status: DC | PRN
  Administered 2012-06-30: 2 mg via ORAL

## 2012-06-30 NOTE — Progress Notes (Signed)
The focus of this group is to help patients review their daily goal of treatment and discuss progress on daily workbooks. Pt attended the evening wrap-up group and responded to all discussion prompts. Pt reported having a good day and that an earlier group containing music and dance greatly improved his mood. Pt appeared more quiet than usual in group, though he smiled when speaking or being spoken to.

## 2012-06-30 NOTE — Progress Notes (Signed)
[facility] LCSW Aftercare Discharge Planning Group Note  06/30/2012 11:17 AM  Participation Quality:  Attentive  Affect:  Blunted  Cognitive:  Oriented  Insight:  Limited  Engagement in Group:  Limited  Modes of Intervention:  Discussion, Exploration and Socialization  Summary of Progress/Problems: "[name]" stated he is still experiencing psychosis and still has suicidal thoughts "on and off."   Is happy with the medications that are prescribed.  Sleep was "so-so."  No other complaints or concerns.  Daryel Gerald B 06/30/2012, 11:17 AM

## 2012-06-30 NOTE — Progress Notes (Signed)
  D) Patient pleasant and cooperative upon my assessment. Patient reports slept "okay," and  appetite is "fine." Patient endorses passive SI, states "I hear voices sometimes telling me to hurt myself." Patient contracts verbally for safety with RN. Patient denies HI, denies visual hallucinations.   A) Patient offered support and encouragement, patient encouraged to discuss feelings/concerns with staff. Patient verbalized understanding. Patient monitored Q15 minutes for safety. Patient met with MD to discuss today's goals and plan of care.  R) Patient isolates to room at times, does attend meals in dining room. Patient appropriate with staff and peers.   Patient taking medications as ordered. Will continue to monitor.

## 2012-06-30 NOTE — Progress Notes (Signed)
[facility] MD Progress Note  06/30/2012 4:22 PM [name]  MRN:  [id number] Subjective:  "I'm sucidal and hearing voices." [name] is resting in bed when we met 1:1 to discuss his care. [name] is quick to assure me that he is indeed having symptoms and does need to be in the hospital. Diagnosis:  Cocaine abuse  ADL's:  Intact  Sleep: Poor  Appetite:  Good  Suicidal Ideation:  Yea bu can contract for safety. Homicidal Ideation:  denies AEB (as evidenced by):  Psychiatric Specialty Exam: Review of Systems  Constitutional: Negative.  Negative for fever, chills, weight loss, malaise/fatigue and diaphoresis.  HENT: Negative for congestion and sore throat.   Eyes: Negative for blurred vision, double vision and photophobia.  Respiratory: Negative for cough, shortness of breath and wheezing.   Cardiovascular: Negative for chest pain, palpitations and PND.  Gastrointestinal: Negative for heartburn, nausea, vomiting, abdominal pain and constipation. Diarrhea: given Immodium.  Musculoskeletal: Negative for myalgias, joint pain and falls.  Neurological: Negative for dizziness, tingling, tremors, sensory change, speech change, focal weakness, seizures, loss of consciousness, weakness and headaches.  Endo/Heme/Allergies: Negative for polydipsia. Does not bruise/bleed easily.  Psychiatric/Behavioral: Negative for depression, suicidal ideas, hallucinations, memory loss and substance abuse. The patient is not nervous/anxious and does not have insomnia.     Blood pressure 108/63, pulse 85, temperature 97.8 F (36.6 C), temperature source Oral, resp. rate 16, height 6\' 1"  (1.854 m), weight 108.863 kg (240 lb).Body mass index is 31.66 kg/(m^2).  General Appearance: good considering the severity of the symptoms he is reporting.  Eye Contact::  Good  Speech:  Clear and Coherent  Volume:  Normal  Mood:  Anxious and Depressed  Affect:  inconsistant with the severity of the symptoms he is reporting   Thought Process:  Linear  Orientation:  Full (Time, Place, and Person)  Thought Content:  Hallucinations: Auditory  Suicidal Thoughts:  Yes.  without intent/plan  Homicidal Thoughts:  No  Memory:  Immediate;   Fair  Judgement:  Impaired  Insight:  Lacking  Psychomotor Activity:  Normal  Concentration:  Fair  Recall:  Fair  Akathisia:  No  Handed:  Right  AIMS (if indicated):     Assets:  Resilience  Sleep:  Number of Hours: 6.25    Current Medications: Current Facility-Administered Medications  Medication Dose Route Frequency Provider Last Rate Last Dose  . acetaminophen (TYLENOL) tablet 650 mg  650 mg Oral Q6H PRN Verne Spurr, PA-C      . alum & mag hydroxide-simeth (MAALOX/MYLANTA) 200-200-20 MG/5ML suspension 30 mL  30 mL Oral Q4H PRN Verne Spurr, PA-C      . benztropine (COGENTIN) tablet 1 mg  1 mg Oral BID Mojeed Akintayo   1 mg at 06/30/12 0754  . diphenhydrAMINE (BENADRYL) tablet 50 mg  50 mg Oral QHS PRN Verne Spurr, PA-C   50 mg at 06/29/12 2300  . hydrOXYzine (ATARAX/VISTARIL) tablet 50 mg  50 mg Oral TID PRN Verne Spurr, PA-C   50 mg at 06/29/12 2300  . ibuprofen (ADVIL,MOTRIN) tablet 400 mg  400 mg Oral Q6H PRN Verne Spurr, PA-C   400 mg at 06/30/12 0755  . lamoTRIgine (LAMICTAL) tablet 25 mg  25 mg Oral BID Mojeed Akintayo   25 mg at 06/30/12 0754  . loperamide (IMODIUM) capsule 2 mg  2 mg Oral Q4H PRN Verne Spurr, PA-C   2 mg at 06/30/12 1405  . magnesium hydroxide (MILK OF MAGNESIA) suspension 30 mL  30 mL Oral Daily PRN Verne Spurr, PA-C      . risperiDONE (RISPERDAL) tablet 2 mg  2 mg Oral BID Mojeed Akintayo   2 mg at 06/30/12 4098    Lab Results: No results found for this or any previous visit (from the past 48 hour(s)).  Physical Findings: AIMS: Facial and Oral Movements Muscles of Facial Expression: None, normal Lips and Perioral Area: None, normal Jaw: None, normal Tongue: None, normal,Extremity Movements Upper (arms, wrists, hands,  fingers): None, normal Lower (legs, knees, ankles, toes): None, normal, Trunk Movements Neck, shoulders, hips: None, normal, Overall Severity Severity of abnormal movements (highest score from questions above): None, normal Incapacitation due to abnormal movements: None, normal Patient's awareness of abnormal movements (rate only patient's report): No Awareness, Dental Status Current problems with teeth and/or dentures?: No Does patient usually wear dentures?: Yes  CIWA:  CIWA-Ar Total: 1  COWS:  COWS Total Score: 3   Treatment Plan Summary: Daily contact with patient to assess and evaluate symptoms and progress in treatment Medication management  Plan: 1. Admit for crisis management and stabilization. 2. Medication management to reduce current symptoms to base line and improve the     patient's overall level of functioning 3. Treat health problems as indicated. 4. Develop treatment plan to decrease risk of relapse upon discharge and the need for     readmission. 5. Psycho-social education regarding relapse prevention and self care. 6. Health care follow up as needed for medical problems. 7. Restart home medications where appropriate.  8. ELOS 1-3 days Medical Decision Making Problem Points:  Established problem, stable/improving (1) Data Points:  Order Aims Assessment (2) Review or order medicine tests (1)  I certify that inpatient services furnished can reasonably be expected to improve the patient's condition.   Rona Ravens. Donise Woodle RPAC 06/30/2012, 4:22 PM

## 2012-06-30 NOTE — Progress Notes (Signed)
Patient ID: [name], male   DOB: 1960-06-11, 52 y.o.   MRN: [id number]   D:Pt was pleasant and cooperative. Informed the writer that his dr attempted to start him on depakote, but that he doesn't like having to get his blood drawn. Stated the dr decided to keep him on his lamictal instead.   A: Support and encouragement was offered. 15 min checks continued for safety  R: Pt remains safe.

## 2012-06-30 NOTE — Progress Notes (Signed)
[facility] Group Notes:  (Counselor/Nursing/MHT/Case Management/Adjunct)  04/09/2012 12:00 PM  Type of Therapy:  Group Therapy  Participation Level:  Minimal  Participation Quality:  Drowsy  Affect:  Blunted  Cognitive:  Lacking  Insight:  Lacking  Engagement in Group:  Lacking  Engagement in Therapy:  Lacking  Modes of Intervention:  Discussion, Socialization and Support  Summary of Progress/Problems: MHA: Pt appeared drowsy throughout group but sat quietly for its entirety. Pt presented with blunted affect throughout speaker's discussion and playing of the guitar.    Smart, Heather N 04/09/2012, 12:00 PM

## 2012-07-01 MED ORDER — LAMOTRIGINE 25 MG PO TABS
50.0000 mg | ORAL_TABLET | Freq: Every day | ORAL | Status: DC
  Administered 2012-07-02 – 2012-07-05 (×4): 50 mg via ORAL
  Filled 2012-07-01 (×5): qty 2

## 2012-07-01 MED ORDER — HYDROXYZINE HCL 25 MG PO TABS
25.0000 mg | ORAL_TABLET | Freq: Three times a day (TID) | ORAL | Status: DC | PRN
  Administered 2012-07-01 – 2012-07-04 (×5): 25 mg via ORAL

## 2012-07-01 MED ORDER — BENZTROPINE MESYLATE 2 MG PO TABS
2.0000 mg | ORAL_TABLET | Freq: Every day | ORAL | Status: DC
  Administered 2012-07-02 – 2012-07-06 (×5): 2 mg via ORAL
  Filled 2012-07-01 (×6): qty 1
  Filled 2012-07-01: qty 14

## 2012-07-01 MED ORDER — RISPERIDONE 2 MG PO TABS
4.0000 mg | ORAL_TABLET | Freq: Every day | ORAL | Status: DC
  Administered 2012-07-02 – 2012-07-04 (×3): 4 mg via ORAL
  Filled 2012-07-01 (×4): qty 2

## 2012-07-01 NOTE — Progress Notes (Signed)
Adult Psychoeducational Group Note  Date:  07/01/2012 Time: 0930 Group Topic/Focus:  Rediscovering Joy:   The focus of this group is to explore various ways to relieve stress in a positive manner.  Participation Level:  Minimal  Participation Quality:  Attentive  Affect:  Appropriate  Cognitive:  Oriented  Insight: Limited  Engagement in Group:  Limited  Modes of Intervention:  Education  Additional Comments:  Pt attend group this morning but did not interact much.  Sruthi Maurer E 07/01/2012, 2:03 PM

## 2012-07-01 NOTE — Clinical Social Work Note (Signed)
Lia Hopping insisted I call his mother with him.  While we did not reach her, and left a message, he shared that he was interested in rehab.  I asked him what his experience is with this.  He releated that he was just at [facility] for 2 weeks when he left here in December, and knows he cannot return there.  I told him about [facility], and let him know that they would be scheduling out, and in fact when I called I got him a screening for admission date of 2/17.  He also inquired about [facility], and he and I looked up the number together and he said he would call.  He also inquired about [facility], and I told him I would check my list of resources to get him numbers of places there.   Agenda appears to be to get into rehab from here as his mother is likely telling him he cannot return home.  Furthermore, based on this and past experiences here, Roxy Manns appears to need to live in a structured setting in order to be successful.  Unfortunately, he identifies himself as mentally ill primarily, with substance abuse a rather insignificant secondary issue.  As such, he is really not a strong candidate for success through rehab unless he embraces the substance abuse issue.

## 2012-07-01 NOTE — Progress Notes (Signed)
Patient ID: [name], male   DOB: [birth year], 52 y.o.   MRN: [id number]  D: Pt +ve for Passive SI, but contracts for safety, +ve for AH ( telling him that he is worthless, and a loses).  Pt denies HI/VH. Pt is pleasant and cooperative. Pt say he just want to get back taking medications like he is supposed to take them.    A: Pt was offered support and encouragement. Pt was given scheduled medications. Pt was encourage to attend groups. Q 15 minute checks were done for safety. Pt given PRN vistaril 2253   R:Pt attends groups and interacts well with peers and staff. Pt is taking medication. Pt has no complaints  At this time.Pt receptive to treatment and safety maintained on unit.

## 2012-07-01 NOTE — Progress Notes (Signed)
[facility] Group Notes:  (Counselor/Nursing/MHT/Case Management/Adjunct)  07/01/2012 2:38 PM   Type of Therapy:  Group Therapy  Participation Level:  Active  Participation Quality:  Drowsy  Affect:  Blunted  Cognitive:  Appropriate  Insight:  Improving  Engagement in Group:  Improving  Engagement in Therapy:  Improving  Modes of Intervention:  Activity, Discussion, Socialization and Support  Summary of Progress/Problems: Topic-Balance: Pt talked about how he achieves balance through listening to music. Pt participated in the ungame and talked about a birthday present that he hopes to get. He expressed that he hopes to get a new base guitar and answered questions about the group pertaining to his prior career as a guitarist in a reggae band.   Smart, Heather N 04/08/2012, 2:34 PM

## 2012-07-01 NOTE — Treatment Plan (Signed)
Interdisciplinary Treatment Plan Update (Adult)  Date: 07/01/2012  Time Reviewed: 10:36 AM   Progress in Treatment: Attending groups: Sporadically Participating in groups: Yes Taking medication as prescribed: Yes Tolerating medication: Yes   Family/Significant other contact made:  No Patient understands diagnosis:  Yes  As evidenced by asking for help with "manic" symptoms, hearing voices and SI Discussing patient identified problems/goals with staff:  Yes  See below Medical problems stabilized or resolved:  Yes Denies suicidal/homicidal ideation: No Issues/concerns per patient self-inventory:  Not filled out Other:  New problem(s) identified: N/A  Reason for Continuation of Hospitalization: Delusions  Hallucinations Medication stabilization Suicidal ideation Other; describe Mood stabilization  Interventions implemented related to continuation of hospitalization: Restart Risperdal and mood stabilizer as determined by Dr.  Nelle Don of group attendance and participation  Additional comments: Pt informed if he does not attend groups he will be assessed for d/c  Estimated length of stay: 3-4 days  Discharge Plan: return home, follow up outpt  New goal(s): N/A  Review of initial/current patient goals per problem list:   1.  Goal(s): Eliminate SI  Met:  No  Target date:2/7  As evidenced by: Pt is still reporting passive SI but can contract for safety.   2.  Goal (s):Stabilize mood with the help of medications/therapeutic milieu  Met:  Yes  Target date:2/4 As evidenced by:Although he states that he feels depressed and is socially withdrawn.  3.  Goal(s): Eliminate psychosis with the help of medications  Met:  No  Target date:2/7  As evidenced by:Roxy Manns insists he is hearing "negative voices" and visual hallucinations.   4.  Goal(s): Identify outpt provider  Met:  No  Target date:2/7  As evidenced [id number] of appointment time and  date  Attendees: Patient:  [name] 07/01/2012 8:27AM  Family:     Physician:  Thedore Mins 07/01/2012 10:36 AM   Nursing:  Joslyn Devon     Extender:      Other:  Trula Slade, MSW Intern 07/01/2012 10:36AM  Other:     Other:     Other:      Scribe for Treatment Team:   Trula Slade MSW Intern

## 2012-07-01 NOTE — Progress Notes (Signed)
Patient ID: [name], male   DOB: 01/23/1961, 52 y.o.   MRN: [id number] [facility] MD Progress Note  07/01/2012 9:35 AM Vasco Chong  MRN:  [id number]  Subjective: Patient continue to report depressive symptoms, anxiety and  auditory hallucinations-hearing voices telling him to kill himself. He is socially withdrawn and has not been attending most of the units activities. He denies having side effects to his medications.  Diagnosis: 1. Bipolar 1 Disorder MRE depressed with psychosis. 2.Cocaine abuse  ADL's:  Intact  Sleep: fair  Appetite:  Good  Suicidal Ideation:  Yes,  but can contract for safety. Homicidal Ideation:  denies AEB (as evidenced by):  Psychiatric Specialty Exam: Review of Systems  Constitutional: Negative.  Negative for fever, chills, weight loss, malaise/fatigue and diaphoresis.  HENT: Negative for congestion and sore throat.   Eyes: Negative for blurred vision, double vision and photophobia.  Respiratory: Negative for cough, shortness of breath and wheezing.   Cardiovascular: Negative for chest pain, palpitations and PND.  Gastrointestinal: Negative for heartburn, nausea, vomiting, abdominal pain and constipation. Diarrhea: given Immodium.  Musculoskeletal: Negative for myalgias, joint pain and falls.  Neurological: Negative for dizziness, tingling, tremors, sensory change, speech change, focal weakness, seizures, loss of consciousness, weakness and headaches.  Endo/Heme/Allergies: Negative for polydipsia. Does not bruise/bleed easily.  Psychiatric/Behavioral: Negative for depression, suicidal ideas, hallucinations, memory loss and substance abuse. The patient is not nervous/anxious and does not have insomnia.     Blood pressure 108/63, pulse 85, temperature 97.8 F (36.6 C), temperature source Oral, resp. rate 16, height 6\' 1"  (1.854 m), weight 108.863 kg (240 lb).Body mass index is 31.66 kg/(m^2).  General Appearance: good considering the severity of the symptoms he  is reporting.  Eye Contact::  Good  Speech:  Clear and Coherent  Volume:  Normal  Mood:  Anxious and Depressed  Affect:  inconsistant with the severity of the symptoms he is reporting  Thought Process:  Linear  Orientation:  Full (Time, Place, and Person)  Thought Content:  Hallucinations: Auditory  Suicidal Thoughts:  Yes.  without intent/plan  Homicidal Thoughts:  No  Memory:  Immediate;   Fair  Judgement:  Impaired  Insight:  Lacking  Psychomotor Activity:  Normal  Concentration:  Fair  Recall:  Fair  Akathisia:  No  Handed:  Right  AIMS (if indicated):     Assets:  Resilience  Sleep:  Number of Hours: 6.75    Current Medications: Current Facility-Administered Medications  Medication Dose Route Frequency Provider Last Rate Last Dose  . acetaminophen (TYLENOL) tablet 650 mg  650 mg Oral Q6H PRN Verne Spurr, PA-C      . alum & mag hydroxide-simeth (MAALOX/MYLANTA) 200-200-20 MG/5ML suspension 30 mL  30 mL Oral Q4H PRN Verne Spurr, PA-C      . benztropine (COGENTIN) tablet 1 mg  1 mg Oral BID Jamarri Vuncannon   1 mg at 07/01/12 0733  . diphenhydrAMINE (BENADRYL) tablet 50 mg  50 mg Oral QHS PRN Verne Spurr, PA-C   50 mg at 06/30/12 2057  . hydrOXYzine (ATARAX/VISTARIL) tablet 25 mg  25 mg Oral TID PRN Joedy Eickhoff      . ibuprofen (ADVIL,MOTRIN) tablet 400 mg  400 mg Oral Q6H PRN Verne Spurr, PA-C   400 mg at 07/01/12 0735  . lamoTRIgine (LAMICTAL) tablet 25 mg  25 mg Oral BID Aleyna Cueva   25 mg at 07/01/12 0733  . loperamide (IMODIUM) capsule 2 mg  2 mg Oral Q4H PRN Verne Spurr,  PA-C   2 mg at 06/30/12 1405  . magnesium hydroxide (MILK OF MAGNESIA) suspension 30 mL  30 mL Oral Daily PRN Verne Spurr, PA-C      . risperiDONE (RISPERDAL) tablet 2 mg  2 mg Oral BID Sherrod Toothman   2 mg at 07/01/12 4782    Lab Results: No results found for this or any previous visit (from the past 48 hour(s)).  Physical Findings: AIMS: Facial and Oral Movements Muscles of  Facial Expression: None, normal Lips and Perioral Area: None, normal Jaw: None, normal Tongue: None, normal,Extremity Movements Upper (arms, wrists, hands, fingers): None, normal Lower (legs, knees, ankles, toes): None, normal, Trunk Movements Neck, shoulders, hips: None, normal, Overall Severity Severity of abnormal movements (highest score from questions above): None, normal Incapacitation due to abnormal movements: None, normal Patient's awareness of abnormal movements (rate only patient's report): No Awareness, Dental Status Current problems with teeth and/or dentures?: No Does patient usually wear dentures?: Yes  CIWA:  CIWA-Ar Total: 1  COWS:  COWS Total Score: 3   Treatment Plan Summary: Daily contact with patient to assess and evaluate symptoms and progress in treatment Medication management  Plan: 1. Admit for crisis management and stabilization. 2. Medication management to reduce current symptoms to base line and improve the     patient's overall level of functioning 3. Treat health problems as indicated. 4. Develop treatment plan to decrease risk of relapse upon discharge and the need for     readmission. 5. Psycho-social education regarding relapse prevention and self care. 6. Health care follow up as needed for medical problems. 7. Restart home medications where appropriate.  8. ELOS 1-3 days Medical Decision Making Problem Points:  Established problem, stable/improving (1) Data Points:  Order Aims Assessment (2) Review or order medicine tests (1)  I certify that inpatient services furnished can reasonably be expected to improve the patient's condition.   Thedore Mins, MD 07/01/2012, 9:35 AM

## 2012-07-01 NOTE — Progress Notes (Signed)
  D) Patient animated and  cooperative upon my assessment. Patient did not attend "Discharge Planning Group" stated "I feel too sleepy, I think it is because of my medicines." Patient endorses passive SI, contracts verbally for safety with RN. Patient endorses auditory hallucinations, states "I hear voices telling me to kill myself." Patient denies HI, denies visual hallucinations.   A) Patient offered support and encouragement, patient encouraged to discuss feelings/concerns with staff. Patient verbalized understanding. Patient monitored Q15 minutes for safety. Patient met with MD  to discuss today's goals and plan of care. Patient encouraged by MD to attend groups, patient verbalizes understanding.   R) Patient isolates to room at times, attending afternoon groups in day room and meals in dining room. Patient appropriate with staff and peers.   Patient taking medications as ordered. Will continue to monitor.

## 2012-07-01 NOTE — Progress Notes (Signed)
Patient ID: [name], male   DOB: 08/30/1960, 52 y.o.   MRN: [id number]   D: Pt informed the writer that he is "still depressed". Stated he started lamictal and is waiting for it to help. Pt states he plans to return to his mother house after discharge. Stated he called today and spoke with his sister, because his mother was busy. Stated his mother is supposed to call back tomorrow.  Pt stated he's still suicidal, but "feels safe here".   A: Support and encouragement was offered. 15 min checks continued for safety.   R: Pt remains safe.

## 2012-07-01 NOTE — Progress Notes (Signed)
Adult Psychosocial Assessment Update Interdisciplinary Team  Previous [facility] admissions/discharges:  Admissions Discharges  Date:current Date:  Date:04/20/12 Date:  Date: Date:  Date: Date:  Date: Date:   Changes since the last Psychosocial Assessment (including adherence to outpatient mental health and/or substance abuse treatment, situational issues contributing to decompensation and/or relapse). [name] states he went from here to [facility] and completed that program.  Subsequently, he went to [place] for an undisclosed purpose and time frame, and he did not follow up as an outpatient, so ran out of his medication.  He also admitted to drinking significant amounts of alcohol and smoking crack cocaine. He came to [place] voluntarily complaining of hearing voices and suicidal ideation.             Discharge Plan 1. Will you be returning to the same living situation after discharge?   Yes: No:x      If no, what is your plan?requesting referral to rehab           2. Would you like a referral for services when you are discharged? Yes:x     If yes, for what services?  No:       See above       Summary and Recommendations (to be completed by the evaluator) [name] is a 52 yo AA male who is known well by [facility] staff.  He is articulate and engaging. He carries a Bipolar and substance abuse diagnosis. 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  Moreover, he has a significant substance abuse issue that he minimizes and acknowledges only as a means of finding housing/appeasing his family.  He can benefit from medication stabilization, therapeutic milieu, a strong dose of reality therapy, and referral for outpt services.                       Signature:  Ida Rogue, 07/01/2012 9:09 AM

## 2012-07-01 NOTE — Progress Notes (Signed)
Spoke with [name]'s mother about rehab options after discharge with pt present. His mother very upset with both her son and with the health care system in general. Allowed mother to vent frustrations and worked to problem solve issue regarding the potential time between hospital discharge and admission into [facility] or other rehab treatment facility. Mother's primary concern was pts' mental health. I assured her that he would be discharged after being stabilized and would have two week supply of medication. Pt provided with information pertinent to applying for Medicaid in [facility], including phone numbers and application. We also discussed [facility] facility and left message for contact person. Mother asked for update Friday regarding potential discharge and aftercare plans.

## 2012-07-01 NOTE — Progress Notes (Signed)
[facility] LCSW Aftercare Discharge Planning Group Note  07/01/2012 9:38 AM  Participation Quality: DID NOT ATTEND AFTERCARE PLANNING> PT seen in tx team.   Summary of Progress/Problems: Pt was told by doctor that group attendance was mandatory and that he would be discharging tomorrow if he failed to comply with attending groups. Pt and doctor discussed further med altercations in order to decrease daytime drowsiness.   Abra Lingenfelter N 07/01/2012, 9:38 AM

## 2012-07-02 DIAGNOSIS — F315 Bipolar disorder, current episode depressed, severe, with psychotic features: Secondary | ICD-10-CM

## 2012-07-02 MED ORDER — BENZOCAINE 10 % MT GEL
Freq: Four times a day (QID) | OROMUCOSAL | Status: DC | PRN
  Administered 2012-07-02: 1 via OROMUCOSAL
  Administered 2012-07-03 – 2012-07-05 (×3): via OROMUCOSAL
  Filled 2012-07-02: qty 9.4

## 2012-07-02 NOTE — Progress Notes (Signed)
  D) Patient quiet but cooperative upon my assessment. Patient completed Patient Self Inventory, reports slept "fair," and  appetite is "poor." Patient rates depression as  10 /10.  Patient endorses SI, contracts verbally for safety with RN. Patient denies HI. Patient denies visual hallucinations, verbalizes "I hear voices telling me I am a loser sometimes."   A) Patient offered support and encouragement, patient encouraged to discuss feelings/concerns with staff. Patient verbalized understanding. Patient monitored Q15 minutes for safety. Patient met with MD  to discuss today's goals and plan of care.  R) Patient visible in milieu, attending groups in day room and meals in dining room. Patient appropriate with staff and peers.   Patient taking medications as ordered.  Patient has a plan to take better care of himself after discharge by continuing to "take medications." Will continue to monitor.

## 2012-07-02 NOTE — Progress Notes (Signed)
[facility] LCSW Aftercare Discharge Planning Group Note  07/02/2012 9:42 AM  Participation Quality:  Appropriate  Affect:  Depressed  Cognitive:  Appropriate  Insight:  Engaged  Engagement in Group:  Engaged  Modes of Intervention:  Discussion, Problem-solving, Socialization and Support  Summary of Progress/Problems: Pt still very depressed and feeling hopeless about situation. He talked about singing a few songs during karaoke last night in an attempt to lift his spirits. Pt stated that he is trying to figure out Medicaid and Disability situation and will call his mother later in the day. Pt asked that SW intern call with him this afternoon. Pt still trying to reach someone from [facility] rehab facility as well, but option one is [facility] (screening assessment date set for 2/17).   Smart, Heather N 07/02/2012, 9:42 AM

## 2012-07-02 NOTE — Progress Notes (Signed)
Patient ID: [name], male   DOB: Jul 15, 1960, 52 y.o.   MRN: [id number] [facility] MD Progress Note  07/02/2012 11:50 AM [name]  MRN:  [id number]  Subjective: "I need something for pain, my tooth hurts." [name] is quick to tell me he is hearing voices and his depression is a 10/10, and he is suicidal with plans to run in front of a car. He goes on to tell me his pain is a 10/10 and he needs "Ultram or Tramadol for pain." He reports his symptoms quickly as if he is worried he will leave something out, or like he's reading a list.  Diagnosis: 1. Bipolar 1 Disorder MRE depressed with psychosis. 2.Cocaine abuse  ADL's:  Intact  Sleep: fair  Appetite:  Good  Suicidal Ideation:  Yes,  but can contract for safety. Homicidal Ideation:  denies AEB (as evidenced by):  Psychiatric Specialty Exam: Review of Systems  Constitutional: Negative.  Negative for fever, chills, weight loss, malaise/fatigue and diaphoresis.  HENT: Negative for congestion and sore throat.   Eyes: Negative for blurred vision, double vision and photophobia.  Respiratory: Negative for cough, shortness of breath and wheezing.   Cardiovascular: Negative for chest pain, palpitations and PND.  Gastrointestinal: Negative for heartburn, nausea, vomiting, abdominal pain and constipation. Diarrhea: given Immodium.  Musculoskeletal: Negative for myalgias, joint pain and falls.  Neurological: Negative for dizziness, tingling, tremors, sensory change, speech change, focal weakness, seizures, loss of consciousness, weakness and headaches.  Endo/Heme/Allergies: Negative for polydipsia. Does not bruise/bleed easily.  Psychiatric/Behavioral: Negative for depression, suicidal ideas, hallucinations, memory loss and substance abuse. The patient is not nervous/anxious and does not have insomnia.     Blood pressure 117/80, pulse 73, temperature 98.5 F (36.9 C), temperature source Oral, resp. rate 16, height 6\' 1"  (1.854 m), weight 108.863  kg (240 lb).Body mass index is 31.66 kg/(m^2).  General Appearance: good considering the severity of the symptoms he is reporting.  Eye Contact::  Good  Speech:  Clear and Coherent  Volume:  Normal  Mood:  Anxious and Depressed  Affect:  Incongruent with his reports of pain and depression and SI  Thought Process:  Linear  Orientation:  Full (Time, Place, and Person)  Thought Content:  Hallucinations: Auditory  Suicidal Thoughts:  Yes.  With intent to walk in front of a car, but states he will contract for safety.  Homicidal Thoughts:  No  Memory:  Immediate;   Fair  Judgement:  Impaired  Insight:  Lacking  Psychomotor Activity:  Normal  Concentration:  Fair  Recall:  Fair  Akathisia:  No  Handed:  Right  AIMS (if indicated):     Assets:  Resilience  Sleep:  Number of Hours: 5.5    Current Medications: Current Facility-Administered Medications  Medication Dose Route Frequency Provider Last Rate Last Dose  . acetaminophen (TYLENOL) tablet 650 mg  650 mg Oral Q6H PRN Verne Spurr, PA-C      . alum & mag hydroxide-simeth (MAALOX/MYLANTA) 200-200-20 MG/5ML suspension 30 mL  30 mL Oral Q4H PRN Verne Spurr, PA-C      . benztropine (COGENTIN) tablet 2 mg  2 mg Oral QHS Mojeed Akintayo      . hydrOXYzine (ATARAX/VISTARIL) tablet 25 mg  25 mg Oral TID PRN Mojeed Akintayo   25 mg at 07/01/12 2251  . ibuprofen (ADVIL,MOTRIN) tablet 400 mg  400 mg Oral Q6H PRN Verne Spurr, PA-C   400 mg at 07/02/12 0802  . lamoTRIgine (LAMICTAL) tablet 50 mg  50 mg Oral Daily Mojeed Akintayo   50 mg at 07/02/12 0800  . loperamide (IMODIUM) capsule 2 mg  2 mg Oral Q4H PRN Verne Spurr, PA-C   2 mg at 06/30/12 1405  . magnesium hydroxide (MILK OF MAGNESIA) suspension 30 mL  30 mL Oral Daily PRN Verne Spurr, PA-C      . risperiDONE (RISPERDAL) tablet 4 mg  4 mg Oral QHS Mojeed Akintayo        Lab Results: No results found for this or any previous visit (from the past 48 hour(s)).  Physical  Findings: AIMS: Facial and Oral Movements Muscles of Facial Expression: None, normal Lips and Perioral Area: None, normal Jaw: None, normal Tongue: None, normal,Extremity Movements Upper (arms, wrists, hands, fingers): None, normal Lower (legs, knees, ankles, toes): None, normal, Trunk Movements Neck, shoulders, hips: None, normal, Overall Severity Severity of abnormal movements (highest score from questions above): None, normal Incapacitation due to abnormal movements: None, normal Patient's awareness of abnormal movements (rate only patient's report): No Awareness, Dental Status Current problems with teeth and/or dentures?: No Does patient usually wear dentures?: No  CIWA:  CIWA-Ar Total: 1  COWS:  COWS Total Score: 3   Treatment Plan Summary: Daily contact with patient to assess and evaluate symptoms and progress in treatment Medication management  Plan: 1. Continue crisis management and stabilization. 2. Medication management to reduce current symptoms to base line and improve the  patient's overall level of functioning 3. Treat health problems as indicated. 4. Develop treatment plan to decrease risk of relapse upon discharge and the need for     readmission. 5. Psycho-social education regarding relapse prevention and self care. 6. Health care follow up as needed for medical problems. 7. Restart home medications where appropriate.  8. ELOS 5-6 days. 9. Patient will have an interview for [facility] on 2/17, but will be discharged out prior to that. Medical Decision Making Problem Points:  Established problem, stable/improving (1) Data Points:  Order Aims Assessment (2) Review or order medicine tests (1)  I certify that inpatient services furnished can reasonably be expected to improve the patient's condition.  [name] RPAC 07/02/2012, 11:50 AM

## 2012-07-02 NOTE — Progress Notes (Signed)
Adult Psychoeducational Group Note  Date:  07/02/2012 Time:  11:46 AM  Group Topic/Focus:  Relapse Prevention Planning:   The focus of this group is to define relapse and discuss the need for planning to combat relapse.  Participation Level:  Active  Participation Quality:  Appropriate, Sharing and Supportive  Affect:  Appropriate  Cognitive:  Appropriate  Insight: Appropriate  Engagement in Group:  Engaged and Supportive  Modes of Intervention:  Support  Additional Comments:  Pt attended group, pt was very quiet, almost fell asleep in group.  Isla Pence M 07/02/2012, 11:46 AM

## 2012-07-02 NOTE — Progress Notes (Signed)
Patient ID: [name], male   DOB: [birth year], 52 y.o.   MRN: [id number]  D: Pt endorses Passive SI and AVH, but contracts for safety. Pt states his mother doesn't want him to come home with SI.  Pt deniesHI. Pt is pleasant and cooperative. Pt states he had a good day, his only issue was his gum soreness. Pt had some Orajel from earlier, so he put it on.   A: Pt was offered support and encouragement. Pt was given scheduled medications. Pt was encourage to attend groups. Q 15 minute checks were done for safety. Pt given PRN ibuprofen at 2030 for gum pain pt states 9 out of 10.   R:Pt attends groups and interacts well with peers and staff. Pt is taking medication. Pt receptive to treatment and safety maintained on unit.

## 2012-07-02 NOTE — Clinical Social Work Note (Signed)
[facility] LCSW Group Therapy  07/02/2012 2:19 PM   Type of Therapy:  Group Therapy  Participation Level:  Active  Participation Quality:  Attentive  Affect:  Appropriate  Cognitive:  Appropriate  Insight:  Improving  Engagement in Therapy:  Engaged  Modes of Intervention:  Clarification, Education, Exploration and Socialization  Summary of Progress/Problems: Today's group focused on relapse prevention.  We defined the term, and then brainstormed on ways to prevent relapse. [name] identifed that he experiences relapse both from a mental health and substance abuse point of view. He talked about how he struggles with hopelessness and guilt when dealing with relapse and feels like he has no one to reach out to for help. [name] feels that he has been abandoned by his family and struggles with finding someone to confide in or reach out to in times of crisis. Pt was reminded of mobile crisis management and crisis line for emergencies. Pt actively participated in game of charades.   Daryel Gerald B 07/02/2012 , 2:19 PM

## 2012-07-03 DIAGNOSIS — F1210 Cannabis abuse, uncomplicated: Secondary | ICD-10-CM

## 2012-07-03 NOTE — Clinical Social Work Note (Signed)
[facility] Group Notes:  (Clinical Social Work)  07/03/2012  11:15-11:45AM  Summary of Progress/Problems:   The main focus of today's process group was for the patient to identify ways in which they have in the past sabotaged their own recovery and reasons they may have done this/what they received from doing it.  Motivational interviewing techniques were utilized to explore motivations and plans to avoid self-sabotage when discharged from the hospital for this admission.  The patient expressed that he has been staying with his mother, and that he feels like the movie Groundhog Day, repeating the same things over and over.  He said that he is currently suicidal, that he started drinking alcohol in an attempt to drown out his depression, and that he has been thinking about trying to get a police officer to shoot him.  The longest he has felt stable was a year while he was in [place], but he stated there is something inside himself that resists loving himself, that it is his comfort zone to be suicidal.  He stated he has to fight not to feel suicidal all the time.  His motivation is 5 out of 10 to make necessary changes, as he feels suicidal currently and therefore cannot commit.  Type of Therapy:  Group Therapy - Process  Participation Level:  Active  Participation Quality:  Attentive and Sharing  Affect:  Blunted and Depressed  Cognitive:  Oriented  Insight:  Developing/Improving  Engagement in Therapy:  Engaged  Modes of Intervention:  Clarification, Education, Limit-setting, Problem-solving, Socialization, Support and Processing, Exploration, Discussion   Ambrose Mantle, LCSW 07/03/2012, 1:04 PM

## 2012-07-03 NOTE — Progress Notes (Signed)
D   Pt is depressed and sad  He complains of a tooth ache  He is logical and coherent  Pt endorses suicidal ideation and does contract for safety   He attends and participates in groups  His interactions with others are appropriate but minimal  He does endorse his mother and sister as being supportive  And they are the reason he is here because they encouraged him to come in to stablize his mood A   Verbal support given  Medications administered and effectiveness monitored   Q 15 min checks R   Pt safe at present

## 2012-07-03 NOTE — Progress Notes (Addendum)
Patient ID: [name], male   DOB: [birth year], 52 y.o.   MRN: [id number] Patient ID: [name], male   DOB: January 26, 1961, 52 y.o.   MRN: [id number] [facility] MD Progress Note  07/03/2012 2:09 PM [name]  MRN:  [id number]  Subjective: "I feel very ashamed to be here again. I got off my medicines again. Start drinking too much again. The hallucinations came back. I was hearing voices and everything. I became suicidal. My medicaid ran out. The people at [place] [facility] took too long to respond so that my medicaid can be transferred here in [place]. I feel hopeless sometimes. I was living with my mother when my mind went bad this time. The good thing was that my sister noticed that my behavior was off. She was the one that took me to the hospital this time. I really wanted to go the [facility], but each time that I needed to be hospitalized, I was always brought to this hospital. I'm tired of it. I'm still not well, not where I need to be. I still feel suicidal. But I feel safe here".  Diagnosis: 1. Bipolar 1 Disorder MRE depressed with psychosis. 2.Cocaine abuse  ADL's:  Intact  Sleep: fair  Appetite:  Good  Suicidal Ideation: Yes" Denies  Homicidal Ideation: "No" denies  AEB (as evidenced by): Per patient's reports.  Psychiatric Specialty Exam: Review of Systems  Constitutional: Negative.  Negative for fever, chills, weight loss, malaise/fatigue and diaphoresis.  HENT: Negative for congestion and sore throat.   Eyes: Negative for blurred vision, double vision and photophobia.  Respiratory: Negative for cough, shortness of breath and wheezing.   Cardiovascular: Negative for chest pain, palpitations and PND.  Gastrointestinal: Negative for heartburn, nausea, vomiting, abdominal pain and constipation. Diarrhea: given Immodium.  Musculoskeletal: Negative for myalgias, joint pain and falls.  Neurological: Negative for dizziness, tingling, tremors, sensory change, speech change, focal weakness,  seizures, loss of consciousness, weakness and headaches.  Endo/Heme/Allergies: Negative for polydipsia. Does not bruise/bleed easily.  Psychiatric/Behavioral: Negative for depression, suicidal ideas, hallucinations, memory loss and substance abuse. The patient is not nervous/anxious and does not have insomnia.     Blood pressure 111/70, pulse 81, temperature 97.1 F (36.2 C), temperature source Oral, resp. rate 20, height 6\' 1"  (1.854 m), weight 108.863 kg (240 lb).Body mass index is 31.67 kg/(m^2).  General Appearance: Casual  Eye Contact::  Good  Speech:  Clear and Coherent  Volume:  Normal  Mood:  Anxious and Depressed  Affect:  Flat, congruent with mood  Thought Process:  Linear  Orientation:  Full (Time, Place, and Person)  Thought Content:  Hallucinations: Auditory  Suicidal Thoughts:  "Yes, without intent or plan, I feel safe here"  Homicidal Thoughts:  No  Memory:  Immediate;   Fair  Judgement:  Impaired  Insight:  Lacking  Psychomotor Activity:  Normal  Concentration:  Fair  Recall:  Fair  Akathisia:  No  Handed:  Right  AIMS (if indicated):     Assets:  Resilience  Sleep:  Number of Hours: 5.75   Current Medications: Current Facility-Administered Medications  Medication Dose Route Frequency Provider Last Rate Last Dose  . acetaminophen (TYLENOL) tablet 650 mg  650 mg Oral Q6H PRN Verne Spurr, PA-C      . alum & mag hydroxide-simeth (MAALOX/MYLANTA) 200-200-20 MG/5ML suspension 30 mL  30 mL Oral Q4H PRN Verne Spurr, PA-C      . benztropine (COGENTIN) tablet 2 mg  2 mg Oral QHS Mojeed  Akintayo      . hydrOXYzine (ATARAX/VISTARIL) tablet 25 mg  25 mg Oral TID PRN Mojeed Akintayo   25 mg at 07/01/12 2251  . ibuprofen (ADVIL,MOTRIN) tablet 400 mg  400 mg Oral Q6H PRN Verne Spurr, PA-C   400 mg at 07/02/12 0802  . lamoTRIgine (LAMICTAL) tablet 50 mg  50 mg Oral Daily Mojeed Akintayo   50 mg at 07/02/12 0800  . loperamide (IMODIUM) capsule 2 mg  2 mg Oral Q4H PRN Verne Spurr, PA-C   2 mg at 06/30/12 1405  . magnesium hydroxide (MILK OF MAGNESIA) suspension 30 mL  30 mL Oral Daily PRN Verne Spurr, PA-C      . risperiDONE (RISPERDAL) tablet 4 mg  4 mg Oral QHS Mojeed Akintayo        Lab Results: No results found for this or any previous visit (from the past 48 hour(s)).  Physical Findings: AIMS: Facial and Oral Movements Muscles of Facial Expression: None, normal Lips and Perioral Area: None, normal Jaw: None, normal Tongue: None, normal,Extremity Movements Upper (arms, wrists, hands, fingers): None, normal Lower (legs, knees, ankles, toes): None, normal, Trunk Movements Neck, shoulders, hips: None, normal, Overall Severity Severity of abnormal movements (highest score from questions above): None, normal Incapacitation due to abnormal movements: None, normal Patient's awareness of abnormal movements (rate only patient's report): No Awareness, Dental Status Current problems with teeth and/or dentures?: No Does patient usually wear dentures?: No  CIWA:  CIWA-Ar Total: 1 COWS:  COWS Total Score: 3  Treatment Plan Summary: Daily contact with patient to assess and evaluate symptoms and progress in treatment Medication management  Plan: Supportive approach/coping skills/relapse prevention. No new changes made on the current treatment plan. Encouraged out of room, participation in group sessions and application of coping skills when distressed. Will continue to monitor response to/adverse effects of medications in use to assure effectiveness. Continue to monitor mood, behavior and interaction with staff and other patients. Continue current plan of care.  Medical Decision Making Problem Points:  Established problem, stable/improving (1) Data Points:  Order Aims Assessment (2) Review or order medicine tests (1)  I certify that inpatient services furnished can reasonably be expected to improve the patient's condition.  Sanjuana Kava, FNP,  PMHNP-BC 07/03/2012, 2:09 PM

## 2012-07-03 NOTE — Progress Notes (Signed)
Psychoeducational Group Note  Date:  07/03/2012 Time:0930am  Group Topic/Focus:  Identifying Needs:   The focus of this group is to help patients identify their personal needs that have been historically problematic and identify healthy behaviors to address their needs.  Participation Level:  Active  Participation Quality:  Appropriate  Affect:  Appropriate  Cognitive:  Appropriate  Insight:  Supportive  Engagement in Group:  Supportive  Additional Comments:  Inventory group   [name] 07/03/2012,10:22 AM

## 2012-07-03 NOTE — Progress Notes (Signed)
Psychoeducational Group Note  Date:  07/03/2012 Time:  0945 am  Group Topic/Focus:  Identifying Needs:   The focus of this group is to help patients identify their personal needs that have been historically problematic and identify healthy behaviors to address their needs.  Participation Level:  Active  Participation Quality:  Appropriate, Attentive, Sharing and Supportive  Affect:  Appropriate and Depressed  Cognitive:  Appropriate and Oriented  Insight:  Engaged  Engagement in Group:  Engaged and Supportive  Additional Comments:    Andrena Mews 07/03/2012,11:58 AM

## 2012-07-04 MED ORDER — HYDROXYZINE HCL 50 MG PO TABS
50.0000 mg | ORAL_TABLET | Freq: Three times a day (TID) | ORAL | Status: DC | PRN
  Administered 2012-07-04: 25 mg via ORAL
  Administered 2012-07-04 – 2012-07-06 (×4): 50 mg via ORAL
  Filled 2012-07-04 (×2): qty 1

## 2012-07-04 MED ORDER — IBUPROFEN 800 MG PO TABS
800.0000 mg | ORAL_TABLET | Freq: Four times a day (QID) | ORAL | Status: DC | PRN
  Administered 2012-07-04 – 2012-07-07 (×9): 800 mg via ORAL
  Filled 2012-07-04 (×9): qty 1

## 2012-07-04 NOTE — Progress Notes (Signed)
D: Pt visible on unit during evening shift, attended wrapup group.  Expressed shame at being back at [facility].  Eye contact brief with anxious facial expression.  Affect is blunted, while mood stayed even and calm throughout shift.  Speech rapid but logical and coherent.  Pt did get into hyper-religious debate with peer late in evening, but peer ended conversation by leaving when another Pt expressed discomfort with content.  Thought content remains hyper-religious and obsessional but Pt is able to change topic with explicit cuing.  Continues to endorse SI, although less intense, denies HI and AVH.  Contracting for safety on unit.  Judgment remains impairs and thought process is mildly confused.  Pt continues to complain of lower right-sided tooth/gum pain; is unable to localize to tooth or gums.  A: Provided with verbal support and cues to maintain appropriate discussion topics.  Reassured that he need not feel shame for asking for help (given support by peers in group).  PRNs: Tylenol 650mg  PO and benzocaine gel for tooth/gum pain (rated 8/10) @2045 ; Vistaril 25mg  PO @2144 ; Vistaril 25mg  PO @2302 .  All medications administered according to med orders and POC.  Q15 minute safety checks maintained as per unit protocol.  R: Pt mildly anxious; extremely concerned regarding [place] Medicaid coverage.  Safety maintained. Dion Saucier RN

## 2012-07-04 NOTE — Progress Notes (Signed)
Adult Psychoeducational Group Note  Date:  07/04/2012 Time:  2:43 AM  Group Topic/Focus:  Wrap-Up Group:   The focus of this group is to help patients review their daily goal of treatment and discuss progress on daily workbooks.  Participation Level:  Active  Participation Quality:  Appropriate  Affect:  Appropriate  Cognitive:  Appropriate  Insight: Appropriate  Engagement in Group:  Engaged  Modes of Intervention:  Support  Additional Comments:  Patient attended and participated in group tonight. He reports having a good day. He went to the gym, played ping pong, spoke with his doctor whom increase his medication. He socialized with peers as well. The patient advised that he cope by talking and praying. He is currently upset and guilty with himself because he has been putting his family through a lot, still they are supportive of him  Scot Dock 07/04/2012, 2:43 AM

## 2012-07-04 NOTE — Progress Notes (Signed)
Patient ID: [name], male   DOB: [birth year], 52 y.o.   MRN: [id number] Psychoeducational Group Note  Date:  07/04/2012 Time:  0930am  Group Topic/Focus:  Making Healthy Choices:   The focus of this group is to help patients identify negative/unhealthy choices they were using prior to admission and identify positive/healthier coping strategies to replace them upon discharge.  Participation Level:  Minimal  Participation Quality:  Sharing  Affect:  Irritable  Cognitive:  Lacking  Insight:  Lacking  Engagement in Group:  Lacking  Additional Comments:  Inventory   [name] 07/04/2012,9:56 AM

## 2012-07-04 NOTE — Progress Notes (Signed)
Psychoeducational Group Note  Date:  07/04/2012 Time:  0945 am  Group Topic/Focus:  Making Healthy Choices:   The focus of this group is to help patients identify negative/unhealthy choices they were using prior to admission and identify positive/healthier coping strategies to replace them upon discharge.  Participation Level:  Did Not Attend  [name] J 07/04/2012, 10:29 AM 

## 2012-07-04 NOTE — Clinical Social Work Note (Signed)
[facility] Group Notes: (Clinical Social Work)   07/04/2012      Type of Therapy:  Group Therapy   Participation Level:  Did Not Attend    Selam Pietsch Grossman-Orr, LCSW 07/04/2012, 12:48 PM     

## 2012-07-04 NOTE — Progress Notes (Signed)
Patient ID: Shell Yandow, male   DOB: [birth year], 52 y.o.   MRN: [id number] Patient ID: Tina Temme, male   DOB: 1961/05/01, 52 y.o.   MRN: [id number] Patient ID: Gabryel Files, male   DOB: 09/27/1960, 52 y.o.   MRN: [id number] [facility] MD Progress Note  07/04/2012 12:45 PM Elliot Meldrum  MRN:  [id number]  Subjective: "I'm hoping to be able to feel better soon. I don't want to continue with this rage, anger, suicidal ideations, depression and the hurt that I'm cursing my family.  I have tooth pain. I need something strong for it. Something to help with sleep won't hurt either".  Diagnosis: 1. Bipolar 1 Disorder MRE depressed with psychosis. 2.Cocaine abuse  ADL's:  Intact  Sleep: fair  Appetite:  Good  Suicidal Ideation: Yes" Denies  Homicidal Ideation: "No" denies  AEB (as evidenced by): Per patient's reports.  Psychiatric Specialty Exam: Review of Systems  Constitutional: Negative.  Negative for fever, chills, weight loss, malaise/fatigue and diaphoresis.  HENT: Negative for congestion and sore throat.   Eyes: Negative for blurred vision, double vision and photophobia.  Respiratory: Negative for cough, shortness of breath and wheezing.   Cardiovascular: Negative for chest pain, palpitations and PND.  Gastrointestinal: Negative for heartburn, nausea, vomiting, abdominal pain and constipation. Diarrhea: given Immodium.  Musculoskeletal: Negative for myalgias, joint pain and falls.  Neurological: Negative for dizziness, tingling, tremors, sensory change, speech change, focal weakness, seizures, loss of consciousness, weakness and headaches.  Endo/Heme/Allergies: Negative for polydipsia. Does not bruise/bleed easily.  Psychiatric/Behavioral: Negative for depression, suicidal ideas, hallucinations, memory loss and substance abuse. The patient is not nervous/anxious and does not have insomnia.     Blood pressure 129/67, pulse 92, temperature 97.4 F (36.3 C), temperature source Oral, resp.  rate 20, height 6\' 1"  (1.854 m), weight 108.863 kg (240 lb).Body mass index is 31.67 kg/(m^2).  General Appearance: Casual  Eye Contact::  Good  Speech:  Clear and Coherent  Volume:  Normal  Mood:  Anxious and Depressed  Affect:  Flat, congruent with mood  Thought Process:  Linear  Orientation:  Full (Time, Place, and Person)  Thought Content:  Hallucinations: Auditory  Suicidal Thoughts:  "Yes, without intent or plan, I feel safe here"  Homicidal Thoughts:  No  Memory:  Immediate;   Fair  Judgement:  Impaired  Insight:  Lacking  Psychomotor Activity:  Normal  Concentration:  Fair  Recall:  Fair  Akathisia:  No  Handed:  Right  AIMS (if indicated):     Assets:  Resilience  Sleep:  Number of Hours: 5.5   Current Medications: Current Facility-Administered Medications  Medication Dose Route Frequency Provider Last Rate Last Dose  . acetaminophen (TYLENOL) tablet 650 mg  650 mg Oral Q6H PRN Verne Spurr, PA-C      . alum & mag hydroxide-simeth (MAALOX/MYLANTA) 200-200-20 MG/5ML suspension 30 mL  30 mL Oral Q4H PRN Verne Spurr, PA-C      . benztropine (COGENTIN) tablet 2 mg  2 mg Oral QHS Mojeed Akintayo      . hydrOXYzine (ATARAX/VISTARIL) tablet 25 mg  25 mg Oral TID PRN Mojeed Akintayo   25 mg at 07/01/12 2251  . ibuprofen (ADVIL,MOTRIN) tablet 400 mg  400 mg Oral Q6H PRN Verne Spurr, PA-C   400 mg at 07/02/12 0802  . lamoTRIgine (LAMICTAL) tablet 50 mg  50 mg Oral Daily Mojeed Akintayo   50 mg at 07/02/12 0800  . loperamide (IMODIUM) capsule 2 mg  2 mg Oral  Q4H PRN Verne Spurr, PA-C   2 mg at 06/30/12 1405  . magnesium hydroxide (MILK OF MAGNESIA) suspension 30 mL  30 mL Oral Daily PRN Verne Spurr, PA-C      . risperiDONE (RISPERDAL) tablet 4 mg  4 mg Oral QHS Mojeed Akintayo        Lab Results: No results found for this or any previous visit (from the past 48 hour(s)).  Physical Findings: AIMS: Facial and Oral Movements Muscles of Facial Expression: None,  normal Lips and Perioral Area: None, normal Jaw: None, normal Tongue: None, normal,Extremity Movements Upper (arms, wrists, hands, fingers): None, normal Lower (legs, knees, ankles, toes): None, normal, Trunk Movements Neck, shoulders, hips: None, normal, Overall Severity Severity of abnormal movements (highest score from questions above): None, normal Incapacitation due to abnormal movements: None, normal Patient's awareness of abnormal movements (rate only patient's report): No Awareness, Dental Status Current problems with teeth and/or dentures?: Yes (sore/throbbing lower right teeth/gums- Pt uncertain which) Does patient usually wear dentures?: No  CIWA:  CIWA-Ar Total: 1 COWS:  COWS Total Score: 3  Treatment Plan Summary: Daily contact with patient to assess and evaluate symptoms and progress in treatment Medication management  Plan: Supportive approach/coping skills/relapse prevention. (A). Increased Ibuprofen from 400 mg to 800 mg Q 6 hours prn for pain. (B). Increased Hydroxyzine from 25 mg to 50 mg for sleep. Encouraged out of room, participation in group sessions and application of coping skills when distressed. Will continue to monitor response to/adverse effects of medications in use to assure effectiveness. Continue to monitor mood, behavior and interaction with staff and other patients. Continue current plan of care.  Medical Decision Making Problem Points:  Established problem, stable/improving (1) Data Points:  Order Aims Assessment (2) Review or order medicine tests (1)  I certify that inpatient services furnished can reasonably be expected to improve the patient's condition.  Sanjuana Kava, FNP, PMHNP-BC 07/04/2012, 12:45 PM

## 2012-07-04 NOTE — Progress Notes (Signed)
[name] is bright, he makes good eye contact and he requests to be called " [name]". He laughs easily, he interacts with his peers appropriately and he admits to this writer that he feels embarassed that he is " back in the hospital".   A This nurse assured him that it is ok to be here and that his life and his safety is paramount. He has attended his groups and taken his meds a s outlined in his POC.   R Safety is in place and POC cont with no evidence of hypomania and / or manic behaviors identified.

## 2012-07-04 NOTE — Progress Notes (Signed)
[facility] Group Notes:  (Nursing/MHT/Case Management/Adjunct)  Date:  07/04/2012  Time:  2000  Type of Therapy:  Psychoeducational Skills  Participation Level:  Active  Participation Quality:  Appropriate  Affect:  Excited  Cognitive:  Appropriate  Insight:  Improving  Engagement in Group:  Engaged  Modes of Intervention:  Education  Summary of Progress/Problems: The patient stated that he was in a better moved since he was transferred from another hallway. Much of his day has been devoted to socializing with his peers. He states that he is waiting for his medication to be changed by his doctor. His goal for tomorrow is to begin working on his discharge plans.   Hazle Coca S 07/04/2012, 10:56 PM

## 2012-07-05 DIAGNOSIS — F1010 Alcohol abuse, uncomplicated: Secondary | ICD-10-CM

## 2012-07-05 MED ORDER — RISPERIDONE 3 MG PO TABS
6.0000 mg | ORAL_TABLET | Freq: Every day | ORAL | Status: DC
  Administered 2012-07-05 – 2012-07-06 (×2): 6 mg via ORAL
  Filled 2012-07-05: qty 28
  Filled 2012-07-05 (×3): qty 2

## 2012-07-05 MED ORDER — LAMOTRIGINE 25 MG PO TABS
75.0000 mg | ORAL_TABLET | Freq: Every day | ORAL | Status: DC
  Administered 2012-07-06 – 2012-07-07 (×2): 75 mg via ORAL
  Filled 2012-07-05 (×2): qty 3
  Filled 2012-07-05: qty 42
  Filled 2012-07-05: qty 3

## 2012-07-05 MED ORDER — TRAMADOL HCL 50 MG PO TABS
100.0000 mg | ORAL_TABLET | Freq: Four times a day (QID) | ORAL | Status: DC | PRN
  Administered 2012-07-05 (×2): 100 mg via ORAL

## 2012-07-05 NOTE — Progress Notes (Signed)
Patient ID: [name], male   DOB: 1961-04-10, 52 y.o.   MRN: [id number] [facility] MD Progress Note  07/05/2012 11:07 AM [name]  MRN:  [id number]  Subjective:Patient reports that he developed toothache over the weekend. He continue to report lack of motivation, low energy level, depressive symptoms, intermittent auditory hallucination and passive suicidal thoughts. He denies craving for drugs. Patient is compliant with his medications and has not reported any adverse reaction to his medications. He agreed to drug rehabilitation upon discharged.    Diagnosis: 1. Bipolar 1 Disorder MRE depressed with psychosis. 2.Cocaine abuse  ADL's:  Intact  Sleep: fair  Appetite:  Good  Suicidal Ideation: Yes" Denies plan or intent  Homicidal Ideation: "No" denies  AEB (as evidenced by): Per patient's reports.  Psychiatric Specialty Exam: Review of Systems  Constitutional: Negative.  Negative for fever, chills, weight loss, malaise/fatigue and diaphoresis.  HENT: Negative for congestion and sore throat.   Eyes: Negative for blurred vision, double vision and photophobia.  Respiratory: Negative for cough, shortness of breath and wheezing.   Cardiovascular: Negative for chest pain, palpitations and PND.  Gastrointestinal: Negative for heartburn, nausea, vomiting, abdominal pain and constipation. Diarrhea: given Immodium.  Musculoskeletal: Negative for myalgias, joint pain and falls.  Neurological: Negative for dizziness, tingling, tremors, sensory change, speech change, focal weakness, seizures, loss of consciousness, weakness and headaches.  Endo/Heme/Allergies: Negative for polydipsia. Does not bruise/bleed easily.  Psychiatric/Behavioral: Negative for depression, suicidal ideas, hallucinations, memory loss and substance abuse. The patient is not nervous/anxious and does not have insomnia.     Blood pressure 129/67, pulse 92, temperature 97.4 F (36.3 C), temperature source Oral, resp. rate 20,  height 6\' 1"  (1.854 m), weight 108.863 kg (240 lb).Body mass index is 31.67 kg/(m^2).  General Appearance: Casual  Eye Contact::  Good  Speech:  Clear and Coherent  Volume:  Normal  Mood:  Anxious and Depressed  Affect:  Flat, congruent with mood  Thought Process:  Linear  Orientation:  Full (Time, Place, and Person)  Thought Content:  Hallucinations: Auditory  Suicidal Thoughts:  "Yes, without intent or plan, I feel safe here"  Homicidal Thoughts:  No  Memory:  Immediate;   Fair  Judgement:  Impaired  Insight:  Lacking  Psychomotor Activity:  Normal  Concentration:  Fair  Recall:  Fair  Akathisia:  No  Handed:  Right  AIMS (if indicated):     Assets:  Resilience  Sleep:  Number of Hours: 6.25   Current Medications: Current Facility-Administered Medications  Medication Dose Route Frequency Provider Last Rate Last Dose  . acetaminophen (TYLENOL) tablet 650 mg  650 mg Oral Q6H PRN Verne Spurr, PA-C      . alum & mag hydroxide-simeth (MAALOX/MYLANTA) 200-200-20 MG/5ML suspension 30 mL  30 mL Oral Q4H PRN Verne Spurr, PA-C      . benztropine (COGENTIN) tablet 2 mg  2 mg Oral QHS Musette Kisamore      . hydrOXYzine (ATARAX/VISTARIL) tablet 25 mg  25 mg Oral TID PRN Ryenne Lynam   25 mg at 07/01/12 2251  . ibuprofen (ADVIL,MOTRIN) tablet 400 mg  400 mg Oral Q6H PRN Verne Spurr, PA-C   400 mg at 07/02/12 0802  . lamoTRIgine (LAMICTAL) tablet 50 mg  50 mg Oral Daily Khiley Lieser   50 mg at 07/02/12 0800  . loperamide (IMODIUM) capsule 2 mg  2 mg Oral Q4H PRN Verne Spurr, PA-C   2 mg at 06/30/12 1405  . magnesium hydroxide (MILK OF  MAGNESIA) suspension 30 mL  30 mL Oral Daily PRN Verne Spurr, PA-C      . risperiDONE (RISPERDAL) tablet 4 mg  4 mg Oral QHS Kyren Vaux        Lab Results: No results found for this or any previous visit (from the past 48 hour(s)).  Physical Findings: AIMS: Facial and Oral Movements Muscles of Facial Expression: None, normal Lips and  Perioral Area: None, normal Jaw: None, normal Tongue: None, normal,Extremity Movements Upper (arms, wrists, hands, fingers): None, normal Lower (legs, knees, ankles, toes): None, normal, Trunk Movements Neck, shoulders, hips: None, normal, Overall Severity Severity of abnormal movements (highest score from questions above): None, normal Incapacitation due to abnormal movements: None, normal Patient's awareness of abnormal movements (rate only patient's report): No Awareness, Dental Status Current problems with teeth and/or dentures?: Yes (sore/throbbing lower right teeth/gums- Pt uncertain which) Does patient usually wear dentures?: No  CIWA:  CIWA-Ar Total: 1 COWS:  COWS Total Score: 3  Treatment Plan Summary: Daily contact with patient to assess and evaluate symptoms and progress in treatment Medication management  Plan: Supportive approach/coping skills/relapse prevention. (A). Increased Ibuprofen from 400 mg to 800 mg Q 6 hours prn for pain. (B). Continue Hydroxyzine  50 mg for qhs as needed for sleep. (C). Increase Risperdal to 6mg  daily at bedtime for psychosis (D) Increase Lamictal to 75mg  daily for mood stabilization. Encouraged out of room, participation in group sessions and application of coping skills when distressed. Will continue to monitor response to/adverse effects of medications in use to assure effectiveness. Continue to monitor mood, behavior and interaction with staff and other patients. Continue current plan of care.  Medical Decision Making Problem Points:  Established problem, stable/improving (1) Data Points:  Order Aims Assessment (2) Review or order medicine tests (1)  I certify that inpatient services furnished can reasonably be expected to improve the patient's condition.  Tyshana Nishida,MD 07/05/2012, 11:07 AM

## 2012-07-05 NOTE — Progress Notes (Signed)
Recreation Therapy Notes  Date: 02.10.2014 Time: 3:00pm      Group Topic/Focus: AAA  Participation Level: Patient did not attend  [employer], LRT/CTRS

## 2012-07-05 NOTE — Progress Notes (Signed)
[facility] LCSW Group Therapy  07/05/2012 2:15pm  Type of Therapy:  Group Therapy  Participation Level:  Active  Participation Quality:  Attentive, Sharing and Supportive  Affect:  Depressed  Cognitive:  Alert and Oriented  Insight:  Developing/Improving  Engagement in Therapy:  Engaged  Modes of Intervention:  Activity, Discussion, Socialization and Support  Summary of Progress/Problems: Today's group focused on the topic of "Overcoming Obstacles". Patients were encouraged to explore what they see as obstacles to their own wellness and recovery. Patient was challenged to identify changes they are motivated to make in order to overcome their obstacles. Patient disclosed within group that his current obstacle is being able to forgive others. Patient stated that he feels comfortable talking to his peers here because they are able to relate to his depression. Patient stated that forgiving others is for the person who was hurt, not for the one who inflicted the pain upon another. Patient was observed to be insightful and provided meaningful dialogue during discussion.   Haskel Khan 07/05/2012, 6:42 PM

## 2012-07-05 NOTE — Clinical Social Work Note (Addendum)
[name] was escalating at the nursing station:demanding that [name] be located.  I intervened to try to de-escalate him.  He asked me to call his mother with him, which we proceeded to do.  She immediately launched into a tirade about how we could even consider discharging her son while he is still symptomatic. [Apparently he had let her know that the Dr told him he would be discharged on Wed.]  She was not looking for an explanation.  She was complaining about his care, and it was clear that he had called her to let her know he is still symptomatic with the hope she could extend his stay here.  I referred her to [name] for follow up since I was not able to appease her.

## 2012-07-05 NOTE — Progress Notes (Signed)
Patient ID: [name], male   DOB: March 07, 1961, 52 y.o.   MRN: [id number] D- Patient reports fair sleep and poor appetite.  His energy level is low and his ability to pay attention is poor.  He rates his depression a 10 and his anxiety a 10.  He also reports that command hallucinations have not gotten better "the medicine is not helping."  He is c/o upper right tooth pain 10/10 and his cheek  appears swollen on R side. Patient asking if dental work is available inpatient.    A -supported patient and informed NP.R- Patient waiting to see MD.  He is attending groups.

## 2012-07-05 NOTE — Progress Notes (Signed)
Patient ID: [name], male   DOB: 14-Jun-1960, 52 y.o.   MRN: [id number] D: Pt. Visible on the unit playing cards and interacting with other clients. Pt. Reports "I got angry today when talking to the doctor" "she don't know anything about med and trying to tell me the medicine should be working, I know my body she don't know my body, I told them to speak to my mom because I was getting angry and didn't want to say something I'd regret"  A: Writer commended pt. On knowing when to step away from a situation and calm down. Pt. Encouraged to talk at a later time. Pt. Encouraged to attend group. Staff will monitor q72min for safety. R. Pt. Is safe on the unit. Pt. Attended and participated in group.

## 2012-07-05 NOTE — Progress Notes (Signed)
Adult Psychoeducational Group Note  Date:  07/05/2012 Time:  3:16 PM  Group Topic/Focus:  Self Care:   The focus of this group is to help patients understand the importance of self-care in order to improve or restore emotional, physical, spiritual, interpersonal, and financial health.  Participation Level:  Active  Participation Quality:  Appropriate and Attentive  Affect:  Appropriate  Cognitive:  Alert and Appropriate  Insight: Appropriate  Engagement in Group:  Engaged  Modes of Intervention:  Discussion  Additional Comments:  Pt was appropriate and very interactive during this group. Pt shared that forgiveness was something that he needed to work on as it relates to his self-care.   Sharyn Lull 07/05/2012, 3:16 PM

## 2012-07-05 NOTE — Progress Notes (Signed)
[facility] Group Notes:  (Nursing/MHT/Case Management/Adjunct)  Date:  07/05/2012  Time:  9:47 PM  Type of Therapy:  Psychoeducational Skills  Participation Level:  Minimal  Participation Quality:  Appropriate  Affect:  Appropriate  Cognitive:  Alert  Insight:  Good  Engagement in Group:  Engaged  Modes of Intervention:  Discussion  Summary of Progress/Problems: Patient participated during wrap-up group this evening. His goal today was to work on getting medications adjusted to stabilize mood. Patient stated "It takes a while when you have been off medications for a few weeks. But the truth is I enjoy feeling manic. It's a little like being superman." Patient pleased that his risperdal was increased today.   Fransisca Kaufmann ANN 07/05/2012, 9:47 PM

## 2012-07-05 NOTE — Progress Notes (Signed)
Met with pt 1:1 who exhibits rapid, pressured speech. Tangential at times though this writer was able to follow for the most part. He is endorsing AH and passive SI but is able to verbally contract for safety. States the risperdal is helping with his sleep however the voices and SI remain. Pt given support and encouragement. Medicated per orders and given vistaril and motrin prn with relief. Pt denies HI/VH and remains safe. Lawrence Marseilles

## 2012-07-05 NOTE — Progress Notes (Signed)
[name] LCSW Aftercare Discharge Planning Group Note  07/05/2012 12:51 PM  Participation Quality:  Attentive  Affect:  Anxious  Cognitive:  Oriented  Insight:  Limited  Engagement in Group:  Engaged  Modes of Intervention:  Discussion, Exploration and Socialization  Summary of Progress/Problems: [name] was very invested in letting me know about all his symptoms this AM.  Began by complaining about tooth ache, and moved on to [name] and SI.  Appears to be angling to increase his LOS.    Daryel Gerald B 07/05/2012, 12:51 PM

## 2012-07-06 DIAGNOSIS — F312 Bipolar disorder, current episode manic severe with psychotic features: Principal | ICD-10-CM

## 2012-07-06 NOTE — Progress Notes (Signed)
[facility] LCSW Aftercare Discharge Planning Group Note  07/06/2012 12:36 PM  Participation Quality: Patient did not attend group on [address].  Wynn Banker 07/06/2012, 12:36 PM

## 2012-07-06 NOTE — Progress Notes (Signed)
Patient ID: [name], male   DOB: 1961-05-13, 52 y.o.   MRN: [id number] [facility] MD Progress Note  07/06/2012 3:25 PM [name]  MRN:  [id number]  Subjective: "My jaw is hurting." Patient states he did receive Tramadol last night for his tooth ache. Today he notes that he is anxious about his discharge and that his mother is going to call [name] and speak with her regarding his discharge for tomorrow.  He states he is having depression at a level of 7, still having SI also at about a "7" today, but states that the voices are decreasing.  Diagnosis: 1. Bipolar 1 Disorder MRE depressed with psychosis. 2.Cocaine abuse  ADL's:  Intact  Sleep: fair  Appetite:  Good  Suicidal Ideation: Yes" Denies plan or intent  Homicidal Ideation: "No" denies  AEB (as evidenced by): Per patient's reports.  Psychiatric Specialty Exam: Review of Systems  Constitutional: Negative.  Negative for fever, chills, weight loss, malaise/fatigue and diaphoresis.  HENT: Negative for congestion and sore throat.   Eyes: Negative for blurred vision, double vision and photophobia.  Respiratory: Negative for cough, shortness of breath and wheezing.   Cardiovascular: Negative for chest pain, palpitations and PND.  Gastrointestinal: Negative for heartburn, nausea, vomiting, abdominal pain and constipation. Diarrhea: given Immodium.  Musculoskeletal: Negative for myalgias, joint pain and falls.  Neurological: Negative for dizziness, tingling, tremors, sensory change, speech change, focal weakness, seizures, loss of consciousness, weakness and headaches.  Endo/Heme/Allergies: Negative for polydipsia. Does not bruise/bleed easily.  Psychiatric/Behavioral: Negative for depression, suicidal ideas, hallucinations, memory loss and substance abuse. The patient is not nervous/anxious and does not have insomnia.     Blood pressure 99/59, pulse 97, temperature 97 F (36.1 C), temperature source Oral, resp. rate 18, height 6'  1" (1.854 m), weight 108.863 kg (240 lb).Body mass index is 31.67 kg/(m^2).  General Appearance: Casual  Eye Contact::  Good  Speech:  Clear and Coherent  Volume:  Normal  Mood:  Anxious and Depressed  Affect:  Flat, congruent with mood  Thought Process:  Linear  Orientation:  Full (Time, Place, and Person)  Thought Content:  Hallucinations: Auditory  Suicidal Thoughts:  "Yes, without intent or plan, I feel safe here"  Homicidal Thoughts:  No  Memory:  Immediate;   Fair  Judgement:  Impaired  Insight:  Lacking  Psychomotor Activity:  Normal  Concentration:  Fair  Recall:  Fair  Akathisia:  No  Handed:  Right  AIMS (if indicated):     Assets:  Resilience  Sleep:  Number of Hours: 5.5   Current Medications: Current Facility-Administered Medications  Medication Dose Route Frequency Provider Last Rate Last Dose  . acetaminophen (TYLENOL) tablet 650 mg  650 mg Oral Q6H PRN Verne Spurr, PA-C      . alum & mag hydroxide-simeth (MAALOX/MYLANTA) 200-200-20 MG/5ML suspension 30 mL  30 mL Oral Q4H PRN Verne Spurr, PA-C      . benztropine (COGENTIN) tablet 2 mg  2 mg Oral QHS Mojeed Akintayo      . hydrOXYzine (ATARAX/VISTARIL) tablet 25 mg  25 mg Oral TID PRN Mojeed Akintayo   25 mg at 07/01/12 2251  . ibuprofen (ADVIL,MOTRIN) tablet 400 mg  400 mg Oral Q6H PRN Verne Spurr, PA-C   400 mg at 07/02/12 0802  . lamoTRIgine (LAMICTAL) tablet 50 mg  50 mg Oral Daily Mojeed Akintayo   50 mg at 07/02/12 0800  . loperamide (IMODIUM) capsule 2 mg  2 mg Oral Q4H PRN  Verne Spurr, PA-C   2 mg at 06/30/12 1405  . magnesium hydroxide (MILK OF MAGNESIA) suspension 30 mL  30 mL Oral Daily PRN Verne Spurr, PA-C      . risperiDONE (RISPERDAL) tablet 4 mg  4 mg Oral QHS Mojeed Akintayo        Lab Results: No results found for this or any previous visit (from the past 48 hour(s)).  Physical Findings: AIMS: Facial and Oral Movements Muscles of Facial Expression: None, normal Lips and Perioral  Area: None, normal Jaw: None, normal Tongue: None, normal,Extremity Movements Upper (arms, wrists, hands, fingers): None, normal Lower (legs, knees, ankles, toes): None, normal, Trunk Movements Neck, shoulders, hips: None, normal, Overall Severity Severity of abnormal movements (highest score from questions above): None, normal Incapacitation due to abnormal movements: None, normal Patient's awareness of abnormal movements (rate only patient's report): No Awareness, Dental Status Current problems with teeth and/or dentures?: No Does patient usually wear dentures?: No  CIWA:  CIWA-Ar Total: 1 COWS:  COWS Total Score: 3  Treatment Plan Summary: Daily contact with patient to assess and evaluate symptoms and progress in treatment Medication management  Plan: Supportive approach/coping skills/relapse prevention. (A). Continue Ibuprofen from 400 mg to 800 mg Q 6 hours prn for pain. (B). Continue Hydroxyzine  50 mg for qhs as needed for sleep. (C). Continue Risperdal to 6mg  daily at bedtime for psychosis (D) Continue Lamictal to 75mg  daily for mood stabilization. (E) Plan for discharge tomorrow barring significant weather problems/transportation. Encouraged out of room, participation in group sessions and application of coping skills when distressed. Will continue to monitor response to/adverse effects of medications in use to assure effectiveness. Continue to monitor mood, behavior and interaction with staff and other patients. Continue current plan of care.  Medical Decision Making Problem Points:  Established problem, stable/improving (1) Data Points:  Order Aims Assessment (2) Review or order medicine tests (1)  I certify that inpatient services furnished can reasonably be expected to improve the patient's condition.  [name]. [name] RPAC 07/06/2012, 3:25 PM

## 2012-07-06 NOTE — Progress Notes (Signed)
Adult Psychoeducational Group Note  Date:  07/06/2012 Time:  1:25 PM   Group Topic/Focus: Psychoeducational Group   Participation Level:  Minimal  Participation Quality:  Attentive  Affect:  Blunted  Cognitive:  Alert  Insight: Limited  Engagement in Group:  Limited  Modes of Intervention:  Activity  Additional Comments:    Barton Fanny 07/06/2012, 1:25 PM

## 2012-07-06 NOTE — Progress Notes (Signed)
Patient ID: [name], male   DOB: Jun 07, 1960, 52 y.o.   MRN: [id number] D- Patient reports fair sleep and poor appetite.  He hada grits for breakfast and feels sore tooth is effecting intake.  His energy level is low and his ability to pay attention is poor.  He is still hearing voices that tell him to kill himself but says the have improved a little.  His depression and anxiety are both rated 10/10, but he rates the voices 5/10 in intensity.  He is attending some groups but currently missing grief and loss group due to tooth pain.  He declined pain med at this time.

## 2012-07-06 NOTE — Progress Notes (Signed)
[facility] LCSW Group Therapy      Feelings About Diagnosis 1:15 - 2:30          07/06/2012 3:43 PM  Type of Therapy:  Group Therapy  Participation Level:  Minimal.  Patient was in group as it started but did not remain.    Wynn Banker 07/06/2012, 3:43 PM

## 2012-07-07 MED ORDER — RISPERIDONE 3 MG PO TABS: 6.0000 mg | ORAL_TABLET | Freq: Every day | ORAL | Status: DC

## 2012-07-07 MED ORDER — LAMOTRIGINE 25 MG PO TABS: 75.0000 mg | ORAL_TABLET | Freq: Every day | ORAL | Status: DC

## 2012-07-07 MED ORDER — BENZTROPINE MESYLATE 2 MG PO TABS: 2.0000 mg | ORAL_TABLET | Freq: Every day | ORAL | Status: DC

## 2012-07-07 NOTE — Discharge Summary (Signed)
Physician Discharge Summary Note  Patient:  [name] is an 52 y.o., male MRN:  [id number] DOB:  August 09, 1960 Patient phone:  [phone] (home)  Patient address:   [address] [place] [address],   Date of Admission:  06/28/2012 Date of Discharge: 07/07/2012  Reason for Admission: Psychosis  Discharge Diagnoses: Principal Problem:   Bipolar disorder, current episode manic severe with psychotic features Active Problems:   Cocaine abuse   Alcohol abuse Discharge Diagnoses:  AXIS I: Bipolar disorder, current episode manic severe with psychotic features  Alcohol abuse. Cocaine abuse, r/o malingering AXIS II: Deferred  AXIS III:  Past Medical History   Diagnosis  Date   .  Depression    .  Arthritis    .  Bipolar 1 disorder     AXIS IV: economic problems, occupational problems, other psychosocial or environmental problems and problems related to social environment  AXIS V: 61-70 mild symptoms Review of Systems  Constitutional: Negative.  Negative for fever, chills, weight loss, malaise/fatigue and diaphoresis.  HENT: Negative for congestion and sore throat.   Eyes: Negative for blurred vision, double vision and photophobia.  Respiratory: Negative for cough, shortness of breath and wheezing.   Cardiovascular: Negative for chest pain, palpitations and PND.  Gastrointestinal: Negative for heartburn, nausea, vomiting, abdominal pain, diarrhea and constipation.  Musculoskeletal: Negative for myalgias, joint pain and falls.  Neurological: Negative for dizziness, tingling, tremors, sensory change, speech change, focal weakness, seizures, loss of consciousness, weakness and headaches.  Endo/Heme/Allergies: Negative for polydipsia. Does not bruise/bleed easily.  Psychiatric/Behavioral: Negative for depression, suicidal ideas, hallucinations, memory loss and substance abuse. The patient is not nervous/anxious and does not have insomnia.    Level of Care:  OP  Hospital Course:   [name] was admitted to [facility] after going to the ED reporting auditory hallucinations and suicidal ideations.  He was recently discharged from [facility] for the second admission in two months stating that he failed to keep his follow up appointments at [facility], and self medicating with alcohol and cocaine.  He was given medical clearance and transferred to [facility] for further treatment.     [name] was stated on Risperdal for his psychosis and Lamictal for mood stabilization.  For side effects affiliated with antipsychotics he was covered with Cogentin.  He was evaluated each day by a clinical provider to assess his response to treatment and his progress. [name] was reluctant to work toward a discharge plan stating that his family was unsure of his stability based on his past behaviors. He was educated about the need for continued in patient stay and the criteria. [name] reported continued symptoms that were incongruent with his affect and behaviors which raised the concern for malingering.     A discharge date was scheduled and a follow up plan developed. [name] was ready to go on the day of discharge and had spoken to his family. He was strongly encouraged to keep all follow up appointments and to take his medication as prescribed.  Consults:  None  Significant Diagnostic Studies:  labs: please see all labs pertinent to this visit via EMR.  Discharge Vitals:   Blood pressure 123/83, pulse 67, temperature 97.8 F (36.6 C), temperature source Oral, resp. rate 18, height 6\' 1"  (1.854 m), weight 108.863 kg (240 lb). Body mass index is 31.67 kg/(m^2). Lab Results:   No results found for this or any previous visit (from the past 72 hour(s)).  Physical Findings: AIMS: Facial and Oral Movements Muscles of Facial  Expression: None, normal Lips and Perioral Area: None, normal Jaw: None, normal Tongue: None, normal,Extremity Movements Upper (arms, wrists, hands, fingers): None, normal Lower (legs, knees, ankles,  toes): None, normal, Trunk Movements Neck, shoulders, hips: None, normal, Overall Severity Severity of abnormal movements (highest score from questions above): None, normal Incapacitation due to abnormal movements: None, normal Patient's awareness of abnormal movements (rate only patient's report): No Awareness, Dental Status Current problems with teeth and/or dentures?: No Does patient usually wear dentures?: No  CIWA:  CIWA-Ar Total: 1 COWS:  COWS Total Score: 3  Psychiatric Specialty Exam: See Psychiatric Specialty Exam and Suicide Risk Assessment completed by Attending Physician prior to discharge.  Discharge destination:  Home  Is patient on multiple antipsychotic therapies at discharge:  No   Has Patient had three or more failed trials of antipsychotic monotherapy by history:  No  Recommended Plan for Multiple Antipsychotic Therapies: Not applicable  Discharge Orders   Future Orders Complete By Expires     Diet - low sodium heart healthy  As directed     Discharge instructions  As directed     Comments:      Take all your medications as prescribed by your mental healthcare provider. Report any adverse effects and or reactions from your medicines to your outpatient provider promptly. Patient is instructed and cautioned to not engage in alcohol and or illegal drug use while on prescription medicines. In the event of worsening symptoms, patient is instructed to call the crisis hotline, 911 and or go to the nearest ED for appropriate evaluation and treatment of symptoms. Follow-up with your primary care provider for your other medical issues, concerns and or health care needs.    Increase activity slowly  As directed         Medication List    STOP taking these medications       diphenhydrAMINE 25 MG tablet  Commonly known as:  BENADRYL     hydrOXYzine 50 MG tablet  Commonly known as:  ATARAX/VISTARIL     ibuprofen 200 MG tablet  Commonly known as:  ADVIL,MOTRIN       TAKE these medications     Indication   benztropine 2 MG tablet  Commonly known as:  COGENTIN  Take 1 tablet (2 mg total) by mouth at bedtime.   Indication:  Extrapyramidal Reaction caused by Medications     lamoTRIgine 25 MG tablet  Commonly known as:  LAMICTAL  Take 3 tablets (75 mg total) by mouth daily.   Indication:  Manic-Depression     risperiDONE 3 MG tablet  Commonly known as:  RISPERDAL  Take 2 tablets (6 mg total) by mouth at bedtime.   Indication:  Manic-Depression           Follow-up Information   Follow up with [facility] On 07/12/2012. (8AM sharp for your screening for admission.  Make sure you have at least a week's supply of medication with you when you go)    Contact information:   [address]  [phone]      Follow-up recommendations:  Due to Adlai's non compliance to medication and poor motivation to Achieve and maintain sobriety it is recommended that he receive a higher level of care should he need readmission in the future.  Comments:    Total Discharge Time:  Greater than 30 minutes.  Signed: Rona Ravens. Shade Kaley RPAC 07/07/2012, 9:29 AM

## 2012-07-07 NOTE — Progress Notes (Signed)
[facility] Group Notes:  (Counselor/Nursing/MHT/Case Management/Adjunct)  Type of Therapy:  Psychoeducational Skills  Participation Level:  Active  Participation Quality:  Appropriate, Attentive and Sharing  Affect:  Appropriate  Cognitive:  Alert, Appropriate and Oriented  Insight:  Appropriate  Engagement in Group:  Engaged  Modes of Intervention:  Activity, Discussion, Education, Problem-solving, Rapport Building, Socialization and Support  Summary of Progress/Problems: "Roxy Manns" attended psychoeducational group that focused on using quality time with support systems/individuals to engage in health coping skills. Roxy Manns participated in activity guessing about self and peers. Roxy Manns was active while group discussed who their support systems are, how they can spend positive quality time with them as a coping skills and a way to strengthen their relationship. Roxy Manns stated his support system includes his friends and family but that some relationships are strained. Roxy Manns was given a homework assignment to find two ways to improve his support systems and twenty activities he can do to spend quality time with his supports.   Wandra Scot 07/07/2012 2:58 PM

## 2012-07-07 NOTE — Progress Notes (Signed)
[facility] INPATIENT:  Family/Significant Other Suicide Prevention Education  Suicide Prevention Education:  Education Completed; Encarnacion Chu, mother, [phone], has been identified by the patient as the family member/significant other with whom the patient will be residing, and identified as the person(s) who will aid the patient in the event of a mental health crisis (suicidal ideations/suicide attempt).  With written consent from the patient, the family member/significant other has been provided the following suicide prevention education, prior to the and/or following the discharge of the patient.  The suicide prevention education provided includes the following:  Suicide risk factors  Suicide prevention and interventions  National Suicide Hotline telephone number  [facility] assessment telephone number  [facility] Emergency Assistance 911  [facility] and/or Residential Mobile Crisis Unit telephone number  Request made of family/significant other to:  Remove weapons (e.g., guns, rifles, knives), all items previously/currently identified as safety concern.    Remove drugs/medications (over-the-counter, prescriptions, illicit drugs), all items previously/currently identified as a safety concern.  The family member/significant other verbalizes understanding of the suicide prevention education information provided.  The family member/significant other agrees to remove the items of safety concern listed above.  Ms Donavan Foil was angry that we were releasing her son now as she believes he needs to be here longer due to his on-going pattern of not taking meds and stating that he is having thoughts of SI.  She blames our decision to release on "the screwed up mental health system in [place]" and  "a Dr who should go back to [place] and clean up the mess there before coming here."  When asked about concerns with SI and how to address, she replied that she knows her son better than [place] and knows  how to address his needs better than [place].  I gave her the name of [name] to call to lodge her complaints.  Daryel Gerald B 07/07/2012, 11:51 AM

## 2012-07-07 NOTE — Progress Notes (Signed)
Patient ID: [name], male   DOB: 15-Jun-1960, 52 y.o.   MRN: [id number] Patient denies SI/HI and A/V hallucinations; patient received his samples, copy of AVS, prescriptions, and taxi voucher after it was reviewed with him; patient received all his belongings; physician aware of status of patient; patient had no other concerns or questions at this time; patient left with the taxi services

## 2012-07-07 NOTE — Progress Notes (Signed)
[facility] LCSW Group Therapy      Emotional Regulation 1:15 - 2:30 PM            07/07/2012 1:57 PM  Type of Therapy:  Group Therapy  Participation Level:  Patient entered group but did not participate.  Wynn Banker 07/07/2012, 1:57 PM

## 2012-07-07 NOTE — [facility] Suicide Risk Assessment (Signed)
Suicide Risk Assessment  Discharge Assessment     Demographic Factors:  Male, Low socioeconomic status and Unemployed  Mental Status Per Nursing Assessment::   On Admission:     Current Mental Status by Physician: patient denies suicidal ideation, intent and plan  Loss Factors: Financial problems/change in socioeconomic status  Historical Factors: NA  Risk Reduction Factors:   Living with another person, especially a relative and Positive social support  Continued Clinical Symptoms:  Resolving depression and anxiety symptoms  Cognitive Features That Contribute To Risk:  Closed-mindedness Polarized thinking    Suicide Risk:  Minimal: No identifiable suicidal ideation.  Patients presenting with no risk factors but with morbid ruminations; may be classified as minimal risk based on the severity of the depressive symptoms  Discharge Diagnoses:   AXIS I:  Bipolar disorder, current episode manic severe with psychotic features              Alcohol abuse. Cocaine abuse  AXIS II:  Deferred AXIS III:   Past Medical History  Diagnosis Date  . Depression   . Arthritis   . Bipolar 1 disorder    AXIS IV:  economic problems, occupational problems, other psychosocial or environmental problems and problems related to social environment AXIS V:  61-70 mild symptoms  Plan Of Care/Follow-up recommendations:  Activity:  as tolerated. Diet:  healthy Tests:  routine Other:  patient to keep his after care appointment with [facility]  Is patient on multiple antipsychotic therapies at discharge:  No   Has Patient had three or more failed trials of antipsychotic monotherapy by history:  No  Recommended Plan for Multiple Antipsychotic Therapies: N/A  Loredana Medellin,MD 07/07/2012, 9:28 AM

## 2012-07-07 NOTE — Progress Notes (Signed)
Pt resting in bed with eyes closed.  No distress observed.  Safety maintained with q15 minute checks. 

## 2012-07-07 NOTE — Progress Notes (Signed)
Adult Psychoeducational Group Note  Date:  07/07/2012 Time:  11:51 AM  Group Topic/Focus:  Personal Choices and Values:   The focus of this group is to help patients assess and explore the importance of values in their lives, how their values affect their decisions, how they express their values and what opposes their expression.  Participation Level:  Active  Participation Quality:  Appropriate and Attentive  Affect:  Appropriate  Cognitive:  Alert  Insight: Good. Has great insight about problems and coping skills.  Engagement in Group:  Engaged  Modes of Intervention:  Discussion, Education and Support  Additional Comments:  Pt contributed to the group discussion and talked about working on medication adjustment while int he hospital. Pt is looking forward to discharge.  Ysenia Filice T 07/07/2012, 11:51 AM

## 2012-07-07 NOTE — Progress Notes (Addendum)
Recreation Therapy Notes  07/07/2012  Time: 3:00pm   Group Topic/Focus: Time Management   Participation Level:  Active   Participation Quality:  Appropriate   Affect:  Euthymic   Cognitive:  Appropriate   Additional Comments: Patient completed "Leisure Time Clock" worksheet. Patient participated in group discussion about using leisure time effectively.   Corey Caulfield L Tarren Sabree, LRT/CTRS  

## 2012-07-07 NOTE — Progress Notes (Signed)
[facility] Adult Case Management Discharge Plan :  Will you be returning to the same living situation after discharge: Yes,  with family At discharge, do you have transportation home?:Yes,  sister Do you have the ability to pay for your medications:Yes,  mental health  Release of information consent forms completed and in the chart;  Patient's signature needed at discharge.  Patient to Follow up at: Follow-up Information   Follow up with [facility] On 07/12/2012. (8AM sharp for your screening for admission.  Make sure you have at least a week's supply of medication with you when you go)    Contact information:   [address]  [facility]  [phone]      Patient denies SI/HI:   Yes,  yes    Safety Planning and Suicide Prevention discussed:  Yes,  yes  Ida Rogue 07/07/2012, 11:51 AM

## 2012-07-12 ENCOUNTER — Emergency Department (HOSPITAL_COMMUNITY)
Admission: EM | Admit: 2012-07-12 | Discharge: 2012-07-12 | Disposition: A | Discharge status: Home / Self Care | Payer: Medicaid Other | Attending: Emergency Medicine | Admitting: Emergency Medicine

## 2012-07-12 DIAGNOSIS — F319 Bipolar disorder, unspecified: Secondary | ICD-10-CM | POA: Insufficient documentation

## 2012-07-12 DIAGNOSIS — K0889 Other specified disorders of teeth and supporting structures: Secondary | ICD-10-CM

## 2012-07-12 DIAGNOSIS — Z79899 Other long term (current) drug therapy: Secondary | ICD-10-CM | POA: Insufficient documentation

## 2012-07-12 DIAGNOSIS — K029 Dental caries, unspecified: Secondary | ICD-10-CM | POA: Insufficient documentation

## 2012-07-12 DIAGNOSIS — K089 Disorder of teeth and supporting structures, unspecified: Secondary | ICD-10-CM | POA: Insufficient documentation

## 2012-07-12 DIAGNOSIS — R319 Hematuria, unspecified: Secondary | ICD-10-CM

## 2012-07-12 DIAGNOSIS — M129 Arthropathy, unspecified: Secondary | ICD-10-CM | POA: Insufficient documentation

## 2012-07-12 LAB — CBC WITH DIFFERENTIAL/PLATELET
Basophils Relative: 0 % (ref 0–1)
Eosinophils Absolute: 0.6 10*3/uL (ref 0.0–0.7)
Hemoglobin: 13.7 g/dL (ref 13.0–17.0)
MCH: 27.7 pg (ref 26.0–34.0)
MCHC: 31.3 g/dL (ref 30.0–36.0)
Monocytes Relative: 7 % (ref 3–12)
Neutrophils Relative %: 55 % (ref 43–77)

## 2012-07-12 LAB — URINALYSIS, ROUTINE W REFLEX MICROSCOPIC
Bilirubin Urine: NEGATIVE
Ketones, ur: NEGATIVE mg/dL
Nitrite: NEGATIVE
Specific Gravity, Urine: 1.024 (ref 1.005–1.030)
Urobilinogen, UA: 1 mg/dL (ref 0.0–1.0)
pH: 6.5 (ref 5.0–8.0)

## 2012-07-12 LAB — URINE MICROSCOPIC-ADD ON

## 2012-07-12 LAB — COMPREHENSIVE METABOLIC PANEL
Albumin: 4.2 g/dL (ref 3.5–5.2)
Alkaline Phosphatase: 74 U/L (ref 39–117)
BUN: 17 mg/dL (ref 6–23)
Calcium: 9.6 mg/dL (ref 8.4–10.5)
Creatinine, Ser: 0.98 mg/dL (ref 0.50–1.35)
Potassium: 4 mEq/L (ref 3.5–5.1)
Total Protein: 8 g/dL (ref 6.0–8.3)

## 2012-07-12 MED ORDER — CEPHALEXIN 500 MG PO CAPS: 500.0000 mg | ORAL_CAPSULE | Freq: Four times a day (QID) | ORAL | Status: DC

## 2012-07-12 MED ORDER — IBUPROFEN 800 MG PO TABS: 800.0000 mg | ORAL_TABLET | Freq: Three times a day (TID) | ORAL | Status: DC

## 2012-07-12 MED ORDER — TRAMADOL HCL 50 MG PO TABS: 50.0000 mg | ORAL_TABLET | Freq: Four times a day (QID) | ORAL | Status: DC | PRN

## 2012-07-12 NOTE — Discharge Summary (Signed)
Seen and agreed. Angenette Daily, MD 

## 2012-07-12 NOTE — Progress Notes (Signed)
During [facility] ED visit Cm spoke with pt to confirm no pcp, no coverage and [place] resident CM discussed and provided written information for list of self pay [place] pcps and affordable care act marketplace enrollment information  

## 2012-07-12 NOTE — ED Provider Notes (Signed)
History     CSN: [id number]  Arrival date & time 07/12/12  1731   First MD Initiated Contact with Patient 07/12/12 2139      Chief Complaint  Patient presents with  . Hematuria  . Dental Pain    (Consider location/radiation/quality/duration/timing/severity/associated sxs/prior treatment) HPI  Patient presents to the emergency departments with complaints of hematuria and right upper dental pain. His tooth has been bothering him for a while but the past couple of days it has gotten significantly worse. He feels as though his tooth is broken and that he is having some swelling over the area. He also has been having hematuria for the past week but he finally told his sister about it which is why he has come in to be seen. He denies to me N/V/D/ chills and painful urination. He asks for something to eat. He has a PMH positive for Bipolar disorder, arthritis and depression. Otherwise he denies being treated for any other medical conditions.   Past Medical History  Diagnosis Date  . Depression   . Arthritis   . Bipolar 1 disorder     No past surgical history on file.  No family history on file.  History  Substance Use Topics  . Smoking status: Never Smoker   . Smokeless tobacco: Not on file  . Alcohol Use: 10.8 oz/week    18 Cans of beer per week     Comment: drank three 6 packs on Friday      Review of Systems  Review of Systems  Gen: no weight loss, fevers, chills, night sweats  Eyes: no discharge or drainage, no occular pain or visual changes  Nose: no epistaxis or rhinorrhea  Mouth: + dental pain, no sore throat  Neck: no neck pain  Lungs:No wheezing, coughing or hemoptysis CV: no chest pain, palpitations, dependent edema or orthopnea  Abd: no abdominal pain, nausea, vomiting  GU: no dysuria + hematuria  + breast tissue swelling MSK:  No abnormalities  Neuro: no headache, no focal neurologic deficits  Skin: no abnormalities Psyche: negative.    Allergies    Penicillins  Home Medications   Current Outpatient Rx  Name  Route  Sig  Dispense  Refill  . benztropine (COGENTIN) 2 MG tablet   Oral   Take 2 mg by mouth every morning.         . lamoTRIgine (LAMICTAL) 25 MG tablet   Oral   Take 75 mg by mouth every morning.         . risperiDONE (RISPERDAL) 3 MG tablet   Oral   Take 6 mg by mouth every morning.         . cephALEXin (KEFLEX) 500 MG capsule   Oral   Take 1 capsule (500 mg total) by mouth 4 (four) times daily.   28 capsule   0   . ibuprofen (ADVIL,MOTRIN) 800 MG tablet   Oral   Take 1 tablet (800 mg total) by mouth 3 (three) times daily.   21 tablet   0   . traMADol (ULTRAM) 50 MG tablet   Oral   Take 1 tablet (50 mg total) by mouth every 6 (six) hours as needed for pain.   10 tablet   0     BP 124/85  Pulse 67  Temp(Src) 98.4 F (36.9 C) (Oral)  Resp 18  SpO2 99%  Physical Exam  Nursing note and vitals reviewed. Constitutional: He appears well-developed and well-nourished. No distress.  HENT:  Head: Normocephalic and atraumatic.  Mouth/Throat: Dental caries present.    Wide spread dental decay  Eyes: Conjunctivae and EOM are normal. Pupils are equal, round, and reactive to light.  Neck: Normal range of motion. Neck supple.  Cardiovascular: Normal rate and regular rhythm.   Pulmonary/Chest: Effort normal and breath sounds normal.  Mild tenderness of breast tissue. NO masses noted. I am unable to appreciate swelling due to body habitus  Abdominal: Soft. There is no tenderness. There is no guarding and no CVA tenderness.  Neurological: He is alert.  Skin: Skin is warm and dry.    ED Course  Procedures (including critical care time)  Labs Reviewed  URINALYSIS, ROUTINE W REFLEX MICROSCOPIC - Abnormal; Notable for the following:    Leukocytes, UA SMALL (*)    All other components within normal limits  CBC WITH DIFFERENTIAL - Abnormal; Notable for the following:    Eosinophils Relative 13 (*)     All other components within normal limits  URINE CULTURE  COMPREHENSIVE METABOLIC PANEL  URINE MICROSCOPIC-ADD ON   No results found.   1. Hematuria   2. Toothache       MDM  Painless hematuria with a stable hemoglobin. He has a small amount of leukocytes in urine with 3-6 red blood cells.  I discussed will treat for UTI but cancer in the bladder can not be ruled out. He has been told that he needs to have his urine rechecked in 1-2 weeks. He and his sister voice understanding.  Pt given Keflex for UTI and dental pain. Patient informed that they need to find a dentist and have the tooth pulled or the symptoms may be reoccurring. A Resource guide has been given with dental providers. Patient has been given return to ED precautions.         Dorthula Matas, PA 07/12/12 2248

## 2012-07-12 NOTE — ED Notes (Addendum)
Patient with multiple complaints. Hematuria, upper right gum pain, cough, emesis, feeling feverish and having chills, and chest discomfort with swelling at the breast. Patient wears partial denture plate in front, states earlier in the week he was having swelling to the right side of his face. Patient states he took some antibiotics (unknown type) given to him by a friend x2 pills yesterday, patient states he thinks he might be allergic to them because he vomited in the parking lot upon arrival. Patient denies taking any medication for fever and chills he reported. Patient educated on the dangers of taking medication not prescribed for him.

## 2012-07-12 NOTE — Progress Notes (Signed)
Patient Discharge Instructions:  After Visit Summary (AVS):   Faxed to:  07/12/12 Discharge Summary Note:   Faxed to:  07/12/12 Psychiatric Admission Assessment Note:   Faxed to:  07/12/12 Suicide Risk Assessment - Discharge Assessment:   Faxed to:  07/12/12 Faxed/Sent to the Next Level Care provider:  07/12/12 Faxed to [facility] @ [phone]  Jerelene Redden, 07/12/2012, 4:26 PM

## 2012-07-12 NOTE — ED Notes (Signed)
Pt states that he has had dental pain and swelling on upper R side. Also states that he has had chills, n/v, painful urination and hematuria after he finishes urinating.

## 2012-07-13 NOTE — ED Provider Notes (Signed)
Medical screening examination/treatment/procedure(s) were performed by non-physician practitioner and as supervising physician I was immediately available for consultation/collaboration.   Flint Melter, MD 07/13/12 0001

## 2012-07-14 LAB — URINE CULTURE

## 2012-08-20 ENCOUNTER — Encounter (HOSPITAL_COMMUNITY): Payer: Self-pay | Admitting: Emergency Medicine

## 2012-08-20 ENCOUNTER — Emergency Department (HOSPITAL_COMMUNITY)
Admission: EM | Admit: 2012-08-20 | Discharge: 2012-08-20 | Disposition: A | Discharge status: Home / Self Care | Payer: Medicaid Other | Attending: Emergency Medicine | Admitting: Emergency Medicine

## 2012-08-20 DIAGNOSIS — N481 Balanitis: Secondary | ICD-10-CM

## 2012-08-20 DIAGNOSIS — N390 Urinary tract infection, site not specified: Secondary | ICD-10-CM | POA: Insufficient documentation

## 2012-08-20 DIAGNOSIS — Z79899 Other long term (current) drug therapy: Secondary | ICD-10-CM | POA: Insufficient documentation

## 2012-08-20 DIAGNOSIS — N476 Balanoposthitis: Secondary | ICD-10-CM | POA: Insufficient documentation

## 2012-08-20 DIAGNOSIS — R369 Urethral discharge, unspecified: Secondary | ICD-10-CM | POA: Insufficient documentation

## 2012-08-20 DIAGNOSIS — N471 Phimosis: Secondary | ICD-10-CM | POA: Insufficient documentation

## 2012-08-20 DIAGNOSIS — F319 Bipolar disorder, unspecified: Secondary | ICD-10-CM | POA: Insufficient documentation

## 2012-08-20 DIAGNOSIS — N478 Other disorders of prepuce: Secondary | ICD-10-CM | POA: Insufficient documentation

## 2012-08-20 DIAGNOSIS — R300 Dysuria: Secondary | ICD-10-CM | POA: Insufficient documentation

## 2012-08-20 DIAGNOSIS — N489 Disorder of penis, unspecified: Secondary | ICD-10-CM | POA: Insufficient documentation

## 2012-08-20 DIAGNOSIS — F329 Major depressive disorder, single episode, unspecified: Secondary | ICD-10-CM | POA: Insufficient documentation

## 2012-08-20 DIAGNOSIS — Z8739 Personal history of other diseases of the musculoskeletal system and connective tissue: Secondary | ICD-10-CM | POA: Insufficient documentation

## 2012-08-20 DIAGNOSIS — F3289 Other specified depressive episodes: Secondary | ICD-10-CM | POA: Insufficient documentation

## 2012-08-20 LAB — URINALYSIS, ROUTINE W REFLEX MICROSCOPIC
Nitrite: NEGATIVE
Specific Gravity, Urine: 1.024 (ref 1.005–1.030)
Urobilinogen, UA: 1 mg/dL (ref 0.0–1.0)

## 2012-08-20 MED ORDER — VALACYCLOVIR HCL 1 G PO TABS: 1000.0000 mg | ORAL_TABLET | Freq: Two times a day (BID) | ORAL | Status: DC

## 2012-08-20 MED ORDER — CEPHALEXIN 500 MG PO CAPS: 500.0000 mg | ORAL_CAPSULE | Freq: Three times a day (TID) | ORAL | Status: DC

## 2012-08-20 MED ORDER — LIDOCAINE HCL (PF) 1 % IJ SOLN
INTRAMUSCULAR | Status: AC
  Administered 2012-08-20: 2 mL
  Filled 2012-08-20: qty 5

## 2012-08-20 MED ORDER — CEFTRIAXONE SODIUM 250 MG IJ SOLR
250.0000 mg | Freq: Once | INTRAMUSCULAR | Status: AC
  Administered 2012-08-20: 250 mg via INTRAMUSCULAR
  Filled 2012-08-20: qty 250

## 2012-08-20 NOTE — ED Notes (Signed)
Pt st's she has blister type rash to penis with pain onset yesterday.  St's he had same type rash approx 1 month ago but it subsided.  Pt denies penile discharge.

## 2012-08-20 NOTE — ED Provider Notes (Signed)
History    This chart was scribed for non-physician practitioner working with Derwood Kaplan, MD by Leone Payor, ED Scribe. This patient was seen in room TR09C/TR09C and the patient's care was started at 1517.   CSN: [id number]  Arrival date & time 08/20/12  1517   None     Chief Complaint  Patient presents with  . Rash    The history is provided by the patient. No language interpreter was used.    [name] is a 52 y.o. male who presents to the Emergency Department complaining of a new episode of constant blister type rash to penis with pain onset yesterday. States he had same type of rash approximately 1 month ago but it subsided after he used bacitracin. He reports using it again with the current episode with no relief. Pt states he is sexually active but denies having any sexual intercourse in the past 6 months. He reports using condoms with sexual activity. He has associated penile itchiness, dysuria. He denies testicular pain, penile discharge.   Pt is an occasional alcohol user but denies smoking.  Past Medical History  Diagnosis Date  . Depression   . Arthritis   . Bipolar 1 disorder     History reviewed. No pertinent past surgical history.  No family history on file.  History  Substance Use Topics  . Smoking status: Never Smoker   . Smokeless tobacco: Not on file  . Alcohol Use: 10.8 oz/week    18 Cans of beer per week     Comment: drank three 6 packs on Friday      Review of Systems  Genitourinary: Positive for dysuria and penile pain. Negative for discharge and testicular pain.  Skin: Positive for rash.  All other systems reviewed and are negative.    Allergies  Penicillins  Home Medications   Current Outpatient Rx  Name  Route  Sig  Dispense  Refill  . benztropine (COGENTIN) 2 MG tablet   Oral   Take 2 mg by mouth every morning.         . cephALEXin (KEFLEX) 500 MG capsule   Oral   Take 1 capsule (500 mg total) by mouth 4 (four) times  daily.   28 capsule   0   . ibuprofen (ADVIL,MOTRIN) 800 MG tablet   Oral   Take 1 tablet (800 mg total) by mouth 3 (three) times daily.   21 tablet   0   . lamoTRIgine (LAMICTAL) 25 MG tablet   Oral   Take 75 mg by mouth every morning.         . risperiDONE (RISPERDAL) 3 MG tablet   Oral   Take 6 mg by mouth every morning.         . traMADol (ULTRAM) 50 MG tablet   Oral   Take 1 tablet (50 mg total) by mouth every 6 (six) hours as needed for pain.   10 tablet   0     There were no vitals taken for this visit.  Physical Exam  Nursing note and vitals reviewed. Constitutional: He is oriented to person, place, and time. He appears well-developed and well-nourished. No distress.  HENT:  Head: Normocephalic and atraumatic.  Eyes: EOM are normal.  Neck: Neck supple. No tracheal deviation present.  Cardiovascular: Normal rate, regular rhythm and normal heart sounds.   Pulmonary/Chest: Effort normal and breath sounds normal. No respiratory distress.  Genitourinary: Right testis shows no mass, no swelling and no tenderness. Left  testis shows no mass, no swelling and no tenderness. Uncircumcised. No penile erythema or penile tenderness. Discharge found.  Papular rash on foreskin. Unable to retract foreskin, which is not new.   Musculoskeletal: Normal range of motion.  Neurological: He is alert and oriented to person, place, and time.  Skin: Skin is warm and dry.  Psychiatric: He has a normal mood and affect. His behavior is normal.    ED Course  Procedures (including critical care time)  DIAGNOSTIC STUDIES: Oxygen Saturation is 99% on room air, normal by my interpretation.    COORDINATION OF CARE: 4:44 PM Discussed treatment plan with pt at bedside and pt agreed to plan.   Results for orders placed during the hospital encounter of 08/20/12  URINALYSIS, ROUTINE W REFLEX MICROSCOPIC      Result Value Range   Color, Urine YELLOW  YELLOW   APPearance CLEAR  CLEAR    Specific Gravity, Urine 1.024  1.005 - 1.030   pH 5.5  5.0 - 8.0   Glucose, UA NEGATIVE  NEGATIVE mg/dL   Hgb urine dipstick TRACE (*) NEGATIVE   Bilirubin Urine NEGATIVE  NEGATIVE   Ketones, ur NEGATIVE  NEGATIVE mg/dL   Protein, ur NEGATIVE  NEGATIVE mg/dL   Urobilinogen, UA 1.0  0.0 - 1.0 mg/dL   Nitrite NEGATIVE  NEGATIVE   Leukocytes, UA LARGE (*) NEGATIVE  URINE MICROSCOPIC-ADD ON      Result Value Range   Squamous Epithelial / LPF FEW (*) RARE   WBC, UA 7-10  <3 WBC/hpf   RBC / HPF 0-2  <3 RBC/hpf   Bacteria, UA FEW (*) RARE    Labs Reviewed - No data to display No results found.   No diagnosis found.  Long-standing phimosis. Suspect balanitis. UTI.  Urology follow-up.  STD, herpes screening pending.   MDM    I personally performed the services described in this documentation, which was scribed in my presence. The recorded information has been reviewed and is accurate.     Jimmye Norman, NP 08/20/12 2003

## 2012-08-20 NOTE — ED Notes (Signed)
Pt states he has a rash to penis, used bacitracin at home without relief.  Pt has had this rash once before last month.  Denies having unprotected sex.

## 2012-08-21 LAB — GC/CHLAMYDIA PROBE AMP
CT Probe RNA: NEGATIVE
GC Probe RNA: NEGATIVE

## 2012-08-21 NOTE — ED Provider Notes (Signed)
Medical screening examination/treatment/procedure(s) were performed by non-physician practitioner and as supervising physician I was immediately available for consultation/collaboration.  Levy Wellman, MD 08/21/12 1649 

## 2012-08-22 LAB — URINE CULTURE: Colony Count: NO GROWTH

## 2012-08-25 LAB — VIRAL CULTURE VIRC: Special Requests: NORMAL

## 2012-08-27 ENCOUNTER — Emergency Department (EMERGENCY_DEPARTMENT_HOSPITAL)
Admission: EM | Admit: 2012-08-27 | Discharge: 2012-08-31 | Disposition: A | Discharge status: Rehabilitation Facility | Payer: Medicaid Other | Source: Home / Self Care | Attending: Emergency Medicine | Admitting: Emergency Medicine

## 2012-08-27 ENCOUNTER — Encounter (HOSPITAL_COMMUNITY): Payer: Self-pay

## 2012-08-27 DIAGNOSIS — F209 Schizophrenia, unspecified: Secondary | ICD-10-CM | POA: Insufficient documentation

## 2012-08-27 DIAGNOSIS — F1910 Other psychoactive substance abuse, uncomplicated: Secondary | ICD-10-CM

## 2012-08-27 DIAGNOSIS — F4310 Post-traumatic stress disorder, unspecified: Secondary | ICD-10-CM | POA: Insufficient documentation

## 2012-08-27 DIAGNOSIS — M1990 Unspecified osteoarthritis, unspecified site: Secondary | ICD-10-CM | POA: Insufficient documentation

## 2012-08-27 DIAGNOSIS — F319 Bipolar disorder, unspecified: Secondary | ICD-10-CM | POA: Insufficient documentation

## 2012-08-27 DIAGNOSIS — Z79899 Other long term (current) drug therapy: Secondary | ICD-10-CM | POA: Insufficient documentation

## 2012-08-27 HISTORY — DX: Schizophrenia, unspecified: F20.9

## 2012-08-27 HISTORY — DX: Post-traumatic stress disorder, unspecified: F43.10

## 2012-08-27 LAB — COMPREHENSIVE METABOLIC PANEL
ALT: 83 U/L — ABNORMAL HIGH (ref 0–53)
AST: 149 U/L — ABNORMAL HIGH (ref 0–37)
Albumin: 4.3 g/dL (ref 3.5–5.2)
BUN: 21 mg/dL (ref 6–23)
Calcium: 9.5 mg/dL (ref 8.4–10.5)
GFR calc non Af Amer: >90 mL/min (ref >90)
Potassium: 3.5 mEq/L (ref 3.5–5.1)
Total Bilirubin: 1.1 mg/dL (ref 0.3–1.2)
Total Protein: 7.7 g/dL (ref 6.0–8.3)

## 2012-08-27 LAB — CBC
HCT: 46.3 % (ref 39.0–52.0)
MCH: 30 pg (ref 26.0–34.0)
MCHC: 33.9 g/dL (ref 30.0–36.0)
RDW: 13 % (ref 11.5–15.5)

## 2012-08-27 LAB — ACETAMINOPHEN LEVEL: Acetaminophen (Tylenol), Serum: <15 ug/mL (ref 10–30)

## 2012-08-27 LAB — RAPID URINE DRUG SCREEN, HOSP PERFORMED
Amphetamines: NOT DETECTED
Tetrahydrocannabinol: NOT DETECTED

## 2012-08-27 MED ORDER — RISPERIDONE 1 MG PO TABS
3.0000 mg | ORAL_TABLET | Freq: Two times a day (BID) | ORAL | Status: DC
  Administered 2012-08-27 – 2012-08-31 (×8): 3 mg via ORAL
  Filled 2012-08-27: qty 1
  Filled 2012-08-27: qty 3
  Filled 2012-08-27 (×4): qty 1
  Filled 2012-08-27: qty 3
  Filled 2012-08-27: qty 1

## 2012-08-27 MED ORDER — ONDANSETRON HCL 4 MG PO TABS: 4.0000 mg | ORAL_TABLET | Freq: Three times a day (TID) | ORAL | Status: DC | PRN

## 2012-08-27 MED ORDER — LAMOTRIGINE 25 MG PO TABS
25.0000 mg | ORAL_TABLET | Freq: Two times a day (BID) | ORAL | Status: DC
  Administered 2012-08-27 – 2012-08-31 (×8): 25 mg via ORAL
  Filled 2012-08-27 (×10): qty 1

## 2012-08-27 MED ORDER — LORAZEPAM 1 MG PO TABS
1.0000 mg | ORAL_TABLET | Freq: Three times a day (TID) | ORAL | Status: DC | PRN
  Administered 2012-08-28 – 2012-08-31 (×7): 1 mg via ORAL
  Filled 2012-08-27 (×7): qty 1

## 2012-08-27 NOTE — ED Notes (Signed)
Patient given a sandwich. Currentlysleeping

## 2012-08-27 NOTE — ED Provider Notes (Signed)
History     CSN: [id number]  Arrival date & time 08/27/12  0910   First MD Initiated Contact with Patient 08/27/12 [phone]      Chief Complaint  Patient presents with  . Medical Clearance  . Depression    (Consider location/radiation/quality/duration/timing/severity/associated sxs/prior treatment) The history is provided by the patient.   patient presents for medical clearance. He is bipolar and depressed his been off his medications for 2 weeks per patient. He states he lost them 2 weeks ago. He also states he was off his medications for his urinary tract infection. He states he lost those a week ago. Patient states he is suicidal. He states he was walking in the middle of the road to get hit by car. He states that his family brought him to get help. Patient states he went to [place] and was drinking and doing drugs the family members. His multiple prior visits to behavioral health. He states his penile discharge is improving. No dysuria.  Past Medical History  Diagnosis Date  . Depression   . Arthritis   . Bipolar 1 disorder   . PTSD (post-traumatic stress disorder)   . Schizophrenia     History reviewed. No pertinent past surgical history.  No family history on file.  History  Substance Use Topics  . Smoking status: Never Smoker   . Smokeless tobacco: Not on file  . Alcohol Use: 10.8 oz/week    18 Cans of beer per week     Comment: drank three 6 packs on Friday      Review of Systems  Constitutional: Negative for activity change and appetite change.  HENT: Negative for neck stiffness.   Eyes: Negative for pain.  Respiratory: Negative for chest tightness and shortness of breath.   Cardiovascular: Negative for chest pain and leg swelling.  Gastrointestinal: Negative for nausea, vomiting, abdominal pain and diarrhea.  Genitourinary: Negative for flank pain.  Musculoskeletal: Negative for back pain.  Skin: Negative for rash.  Neurological: Negative for weakness,  numbness and headaches.  Psychiatric/Behavioral: Positive for suicidal ideas and dysphoric mood. Negative for behavioral problems.    Allergies  Penicillins  Home Medications   Current Outpatient Rx  Name  Route  Sig  Dispense  Refill  . cephALEXin (KEFLEX) 500 MG capsule   Oral   Take 500 mg by mouth 3 (three) times daily. He was to take for 10 days. His pharmacy filled this for him on 08/20/2012. He has not completed this regimen.         . lamoTRIgine (LAMICTAL) 25 MG tablet   Oral   Take 25 mg by mouth 2 (two) times daily.          . risperiDONE (RISPERDAL) 3 MG tablet   Oral   Take 3 mg by mouth 2 (two) times daily.          . valACYclovir (VALTREX) 1000 MG tablet   Oral   Take 1,000 mg by mouth 2 (two) times daily.           BP 129/72  Pulse 100  Temp(Src) 98.5 F (36.9 C) (Oral)  Resp 18  SpO2 100%  Physical Exam  Nursing note and vitals reviewed. Constitutional: He is oriented to person, place, and time. He appears well-developed and well-nourished.  HENT:  Head: Normocephalic and atraumatic.  Eyes: EOM are normal. Pupils are equal, round, and reactive to light.  Neck: Normal range of motion. Neck supple.  Cardiovascular: Normal rate, regular rhythm and  normal heart sounds.   No murmur heard. Pulmonary/Chest: Effort normal and breath sounds normal.  Abdominal: Soft. Bowel sounds are normal. He exhibits no distension and no mass. There is no tenderness. There is no rebound and no guarding.  Genitourinary:  Patient has phimosis with some white discharge. No tenderness to the penis, but unable to visualize it. No swelling of the foreskin.  Musculoskeletal: Normal range of motion. He exhibits no edema.  Neurological: He is alert and oriented to person, place, and time. No cranial nerve deficit.  Skin: Skin is warm and dry.  Psychiatric: He has a normal mood and affect.    ED Course  Procedures (including critical care time)  Labs Reviewed   COMPREHENSIVE METABOLIC PANEL - Abnormal; Notable for the following:    AST 149 (*)    ALT 83 (*)    All other components within normal limits  SALICYLATE LEVEL - Abnormal; Notable for the following:    Salicylate Lvl <2.0 (*)    All other components within normal limits  URINE RAPID DRUG SCREEN (HOSP PERFORMED) - Abnormal; Notable for the following:    Cocaine POSITIVE (*)    All other components within normal limits  ACETAMINOPHEN LEVEL  CBC  ETHANOL   No results found.   1. Bipolar disorder   2. Substance abuse       MDM  Patient returns with substance abuse and suicidal thoughts. Multiple prior visits and has had admissions for the same. He appears to medically cleared at this time. He does not appear urinary tract infection and cultures from previous visit are negative. Patient was be seen by ACT team.        Juliet Rude. Rubin Payor, MD 08/27/12 [phone]

## 2012-08-27 NOTE — ED Notes (Addendum)
Pt here for c/o hurting himself and wanting to kill his mother  Last night pt stated that he has been drinking and not taking his meds. Pt stated take he lost his meds a week ago Pt stated he is SI and had a plan to jump in front of a car denies HI

## 2012-08-27 NOTE — BHH Counselor (Signed)
Melynda Ripple, assessment counselor at [facility], submitted Pt for admission to [facility]. Laverle Hobby, [facility] confirmed bed availability. Nanine Means, NP reviewed clinical information due to Pt's multiple hospitalizations at [facility] and note from his discharge summary from Verne Spurr, [place] which states: "Due to Linell's non compliance to medication and poor motivation to achieve and maintain sobriety it is recommended that he receive a higher level of care should he need readmission in the future." Notified Ardelia Mems, RN at [facility] of disposition.  Harlin Rain Patsy Baltimore, LPC, [facility] Assessment Counselor

## 2012-08-27 NOTE — ED Notes (Signed)
Phone taken out of room. Belongings taken and labeled.Door remains open.Patient being wanded.

## 2012-08-27 NOTE — ED Notes (Signed)
Pt seemed concerned with where he will be going from here, he wants to go to [facility] and asked "Y'all don't discharge people if they are still suicidal do you?"

## 2012-08-27 NOTE — BH Assessment (Signed)
Assessment Note   [name] is an 52 y.o. male.Patient with history of depression, PTSD, and schizophrenia presents to [facility] after making a suicide attempt. Patient reportedly ran out into traffic with intentions of getting hit. He also laid in the middle of the road with intentions of getting ran over. Patients mother ran out to get him out of street. His mother and brother brought him to [facility] for help. Patient sts he has felt suicidal for the past 2-3 weeks. Sts that he lost his prescription medications (Vistaril and Risperdal) about 2-3 weeks ago while he was on vacation. Sts that [facility] prescribes these medications for him. Patient continues to endorse suicidal thoughts and is unable to contract for safety. He also mentions that he broke up with his girlfriend on January 2014 and continues having a difficult time dealing with this. He has a history of 3 prior suicide attempts (overdoses and cutting wrist).   He denies HI.   He reports AVH's. He has auditory (comand)  hallucinations telling him, "Your a piece of shit", "You are worthless", "Go kill yourself". Patient reports a history of visual hallucinations and seeing spirits. However, he denies current visual hallucinations at this time.   Patient has a history of alcohol and drug use. He reports relapsing on Cocaine and Alcohol 2 weeks ago. He is drinking (2) 6 packs of alcohol daily. He is using cocaine 2-3'x per week at $50 per use. His last use was 08/27/2012, this morning. Patient denies withdrawal symptoms at this time.   Patent is current receiving outpatient services at [facility]. He reports previous psychiatric hospitalizations at [facility] and [facility].   Axis I: Major Depression, Recurrent severe, with psychotic feature; Alcohol Dependence; Cocaine Abuse Axis II: Deferred Axis III:  Past Medical History  Diagnosis Date  . Depression   . Arthritis   . Bipolar 1 disorder   . PTSD (post-traumatic stress disorder)   . Schizophrenia    Axis  IV: other psychosocial or environmental problems, problems related to social environment, problems with access to health care services and problems with primary support group Axis V: 31-40 impairment in reality testing  Past Medical History:  Past Medical History  Diagnosis Date  . Depression   . Arthritis   . Bipolar 1 disorder   . PTSD (post-traumatic stress disorder)   . Schizophrenia     History reviewed. No pertinent past surgical history.  Family History: No family history on file.  Social History:  reports that he has never smoked. He does not have any smokeless tobacco history on file. He reports that he drinks about 10.8 ounces of alcohol per week. He reports that he uses illicit drugs (Cocaine).  Additional Social History:  Alcohol / Drug Use Pain Medications: SEE MAR Prescriptions: SEE MAR Over the Counter: SEE MAR History of alcohol / drug use?: Yes Longest period of sobriety (when/how long): unk Substance #1 Name of Substance 1: Cocaine  1 - Age of First Use: late 27's 1 - Amount (size/oz): $50 worth with each use 1 - Frequency: 2x's per week 1 - Duration: relapsed 2 weeks ago; 2 weeks using coaine (2x's per week) 1 - Last Use / Amount: 08/27/2012 "this morning" Substance #2 Name of Substance 2: Alcohol  2 - Amount (size/oz): (2) 6 packs 2 - Frequency: daily  2 - Duration: 2 weeks 2 - Last Use / Amount: 08/27/2012  CIWA: CIWA-Ar BP: 110/72 mmHg Pulse Rate: 88 COWS:    Allergies:  Allergies  Allergen  Reactions  . Penicillins Nausea And Vomiting    Home Medications:  (Not in a hospital admission)  OB/GYN Status:  No LMP for male patient.  General Assessment Data Location of Assessment: [facility] Living Arrangements: Other (Comment) (lives with mother ) Can pt return to current living arrangement?: No Admission Status: Voluntary Is patient capable of signing voluntary admission?: Yes Transfer from: Acute Hospital Referral Source:  Self/Family/Friend  Education Status Is patient currently in school?: No  Risk to self Suicidal Ideation: Yes-Currently Present Suicidal Intent: Yes-Currently Present Is patient at risk for suicide?: Yes Suicidal Plan?: Yes-Currently Present Specify Current Suicidal Plan:  (patient ran out in traffic and layed in the middle of street) Access to Means: No What has been your use of drugs/alcohol within the last 12 months?:  (n/a) Previous Attempts/Gestures: No How many times?:  (n/a) Other Self Harm Risks:  (none reported) Triggers for Past Attempts: Other (Comment) (grandmother passed away and broke up with girlfriend) Intentional Self Injurious Behavior: None Family Suicide History: No Recent stressful life event(s): Other (Comment) (patient not getting over breakup w/ girlfriend in January) Persecutory voices/beliefs?: No Depression: No Substance abuse history and/or treatment for substance abuse?: Yes Suicide prevention information given to non-admitted patients: Not applicable  Risk to Others Homicidal Ideation: No Thoughts of Harm to Others: No Current Homicidal Intent: No Current Homicidal Plan: No Access to Homicidal Means: No Identified Victim:  (n/a) History of harm to others?: No Assessment of Violence: None Noted Violent Behavior Description:  (patient is calm and cooperative.) Does patient have access to weapons?: No Criminal Charges Pending?: No Does patient have a court date: No  Psychosis Hallucinations: Auditory;With command ("Voices tell me to kill myself"; "You are worthless") Delusions: Unspecified (Visual- spirits)  Mental Status Report Appear/Hygiene: Disheveled Eye Contact: Good Motor Activity: Freedom of movement Speech: Logical/coherent Level of Consciousness: Alert Mood: Depressed Affect: Appropriate to circumstance Anxiety Level: None Thought Processes: Coherent;Relevant Judgement: Impaired Orientation:  Person;Place;Situation;Time Obsessive Compulsive Thoughts/Behaviors: None  Cognitive Functioning Concentration: Decreased Memory: Remote Intact;Recent Intact IQ: Average Insight: Poor Impulse Control: Poor Appetite: Poor Weight Loss:  (none reported) Weight Gain:  (none reported) Sleep: Decreased Total Hours of Sleep:  (n/a) Vegetative Symptoms: None  ADLScreening [facility] Assessment Services) Patient's cognitive ability adequate to safely complete daily activities?: Yes Patient able to express need for assistance with ADLs?: Yes Independently performs ADLs?: Yes (appropriate for developmental age)  Abuse/Neglect [facility]) Physical Abuse: Denies Verbal Abuse: Denies Sexual Abuse: Denies  Prior Inpatient Therapy Prior Inpatient Therapy: Yes Prior Therapy Dates:  (patient unable recall dates of admission) Prior Therapy Facilty/Provider(s):  (several previous admissions to [facility]; [facility]; ) Reason for Treatment:  (depression, suicide attempt, AVH's)  Prior Outpatient Therapy Prior Outpatient Therapy: Yes Prior Therapy Dates:  (currently) Prior Therapy Facilty/Provider(s):  ([facility])  ADL Screening (condition at time of admission) Patient's cognitive ability adequate to safely complete daily activities?: Yes Patient able to express need for assistance with ADLs?: Yes Independently performs ADLs?: Yes (appropriate for developmental age) Weakness of Legs: None Weakness of Arms/Hands: None  Home Assistive Devices/Equipment Home Assistive Devices/Equipment: None  Therapy Consults (therapy consults require a physician order) PT Evaluation Needed: No OT Evalulation Needed: No SLP Evaluation Needed: No Abuse/Neglect Assessment (Assessment to be complete while patient is alone) Physical Abuse: Denies Verbal Abuse: Denies Sexual Abuse: Denies Exploitation of patient/patient's resources: Denies Self-Neglect: Denies Values / Beliefs Cultural Requests During Hospitalization:  None Spiritual Requests During Hospitalization: None Consults Spiritual Care Consult Needed: No Social  Work Consult Needed: No Merchant [employer] officer (For Healthcare) Advance Directive: Patient does not have advance directive Nutrition Screen- [facility] Adult/[facility] Patient's home diet: Regular Have you been eating poorly because of a decreased appetite?: No  Additional Information 1:1 In Past 12 Months?: No CIRT Risk: No Elopement Risk: No Does patient have medical clearance?: Yes     Disposition:  Disposition Initial Assessment Completed for this Encounter: Yes Disposition of Patient: Inpatient treatment program Type of inpatient treatment program: Adult  On Site Evaluation by:   Reviewed with Physician:     Octaviano Batty 08/27/2012 6:02 PM

## 2012-08-28 NOTE — ED Notes (Signed)
Pt said he attempted suicide three times before and was hospitalized about 5-6 times as well. Most recent ETOH binge for 3 days drinking a 6 pack daily.

## 2012-08-28 NOTE — ED Notes (Signed)
telepsych in progress 

## 2012-08-28 NOTE — ED Provider Notes (Addendum)
Pt resting, nad, vitals normal. Multiple prior ed visits and bhh stays for same. Will get telepsych consult this am.   Suzi Roots, MD 08/28/12 0710  telepsych rec inpt psych tx, act team working on placement.   Suzi Roots, MD 08/28/12 1425

## 2012-08-29 NOTE — BHH Counselor (Signed)
Referrals faxed to [facility].  Evette Cristal, [place] Assessment Counselor

## 2012-08-29 NOTE — ED Provider Notes (Signed)
Resting comfortably, no distress. Awaiting placement.  BP 109/69  Pulse 71  Temp(Src) 98.2 F (36.8 C) (Oral)  Resp 16  SpO2 97%   Glynn Octave, MD 08/29/12 [phone]

## 2012-08-30 DIAGNOSIS — F1410 Cocaine abuse, uncomplicated: Secondary | ICD-10-CM

## 2012-08-30 DIAGNOSIS — F29 Unspecified psychosis not due to a substance or known physiological condition: Secondary | ICD-10-CM

## 2012-08-30 NOTE — ED Provider Notes (Signed)
Patient is currently medically stable. He is awaiting psychiatric placement. Filed Vitals:   08/30/12 0601  BP: 110/69  Pulse: 78  Temp: 97.6 F (36.4 C)  Resp: 20     Celene Kras, MD 08/30/12 [phone]

## 2012-08-30 NOTE — Consult Note (Signed)
Reason for Consult: schizophrenia and substance abuse Referring Physician: Dr. Ramond Marrow [name] is an 52 y.o. male.  HPI: Patient was presented with symptoms of auditory hallucinations, commanding to hurt himself. He has lost his medication while traveling to [place], started drinking and came to [facility] because he starting lying on traffic to kill himself. He stated that his family wants him to go to long term hospitalization. He stated that he is having commanding auditory hallucinations. He has history of self injurious behaviors and showed well healed marks on his fore arm and left temporal area of head. Patient was known to [facility] and had multiple admissions and non compliance with medications. He has been seen at [facility] for out patient treatment.   MSE: Patient is walking in hall way and has anxious mood and affect. He has normal speech and thought process. He has command auditory hallucinations and denied visual hallucinations. He has SI with plan and no homicidal ideations.   Past Medical History  Diagnosis Date  . Depression   . Arthritis   . Bipolar 1 disorder   . PTSD (post-traumatic stress disorder)   . Schizophrenia     History reviewed. No pertinent past surgical history.  No family history on file.  Social History:  reports that he has never smoked. He does not have any smokeless tobacco history on file. He reports that he drinks about 10.8 ounces of alcohol per week. He reports that he uses illicit drugs (Cocaine).  Allergies:  Allergies  Allergen Reactions  . Penicillins Nausea And Vomiting    Medications: I have reviewed the patient's current medications.  No results found for this or any previous visit (from the past 48 hour(s)).  No results found.  Positive for anxiety, bad mood, behavior problems, bipolar, borderline personality disorder, depression, excessive alcohol consumption, illegal drug usage, mood swings, sleep disturbance, tobacco use and cocaine  abuse Blood pressure 110/69, pulse 78, temperature 97.6 F (36.4 C), temperature source Oral, resp. rate 20, SpO2 96.00%.   Assessment/Plan: Cocaine intoxication Psychosis NOS  Recommended crisis stabilization and start home medication. He may need wraparound services and substance rehab services.   Kassy Mcenroe,[name] 08/30/2012, 11:33 AM

## 2012-08-31 ENCOUNTER — Encounter (HOSPITAL_COMMUNITY): Payer: Self-pay | Admitting: *Deleted

## 2012-08-31 ENCOUNTER — Inpatient Hospital Stay (HOSPITAL_COMMUNITY)
Admission: EM | Admit: 2012-08-31 | Discharge: 2012-09-13 | DRG: 885 | Disposition: A | Discharge status: Home / Self Care | Payer: Medicaid Other | Source: Intra-hospital | Attending: Psychiatry | Admitting: Psychiatry

## 2012-08-31 DIAGNOSIS — F1010 Alcohol abuse, uncomplicated: Secondary | ICD-10-CM

## 2012-08-31 DIAGNOSIS — F1020 Alcohol dependence, uncomplicated: Secondary | ICD-10-CM | POA: Diagnosis present

## 2012-08-31 DIAGNOSIS — F1410 Cocaine abuse, uncomplicated: Secondary | ICD-10-CM

## 2012-08-31 DIAGNOSIS — F312 Bipolar disorder, current episode manic severe with psychotic features: Principal | ICD-10-CM | POA: Diagnosis present

## 2012-08-31 DIAGNOSIS — Z79899 Other long term (current) drug therapy: Secondary | ICD-10-CM

## 2012-08-31 DIAGNOSIS — F1420 Cocaine dependence, uncomplicated: Secondary | ICD-10-CM | POA: Diagnosis present

## 2012-08-31 DIAGNOSIS — R45851 Suicidal ideations: Secondary | ICD-10-CM

## 2012-08-31 MED ORDER — ACETAMINOPHEN 325 MG PO TABS
650.0000 mg | ORAL_TABLET | Freq: Four times a day (QID) | ORAL | Status: DC | PRN
  Administered 2012-09-01 – 2012-09-02 (×2): 650 mg via ORAL

## 2012-08-31 MED ORDER — ALUM & MAG HYDROXIDE-SIMETH 200-200-20 MG/5ML PO SUSP: 30.0000 mL | ORAL | Status: DC | PRN

## 2012-08-31 MED ORDER — NICOTINE 14 MG/24HR TD PT24
14.0000 mg | MEDICATED_PATCH | Freq: Every day | TRANSDERMAL | Status: DC
  Filled 2012-08-31 (×2): qty 1

## 2012-08-31 MED ORDER — TRAZODONE HCL 50 MG PO TABS
50.0000 mg | ORAL_TABLET | Freq: Every evening | ORAL | Status: DC | PRN
  Filled 2012-08-31 (×9): qty 1

## 2012-08-31 MED ORDER — LAMOTRIGINE 100 MG PO TABS
100.0000 mg | ORAL_TABLET | Freq: Two times a day (BID) | ORAL | Status: DC
  Filled 2012-08-31: qty 1

## 2012-08-31 MED ORDER — VALACYCLOVIR HCL 500 MG PO TABS
1000.0000 mg | ORAL_TABLET | Freq: Two times a day (BID) | ORAL | Status: DC
  Administered 2012-08-31 – 2012-09-13 (×26): 1000 mg via ORAL
  Filled 2012-08-31 (×4): qty 2
  Filled 2012-08-31: qty 28
  Filled 2012-08-31 (×11): qty 2
  Filled 2012-08-31: qty 28
  Filled 2012-08-31 (×14): qty 2

## 2012-08-31 MED ORDER — MAGNESIUM HYDROXIDE 400 MG/5ML PO SUSP: 30.0000 mL | Freq: Every day | ORAL | Status: DC | PRN

## 2012-08-31 NOTE — ED Provider Notes (Signed)
Pt with stable vitals--awaiiting placement and will be rechecked by dr. j again today   Toy Baker, MD 08/31/12 [phone]

## 2012-08-31 NOTE — ED Notes (Signed)
Pt. States that he is feeling anxious, requesting Ativan, states that it will help him.

## 2012-08-31 NOTE — Tx Team (Addendum)
Initial Interdisciplinary Treatment Plan  PATIENT STRENGTHS: (choose at least two) Ability for insight Active sense of humor Average or above average intelligence Capable of independent living Communication skills General fund of knowledge Religious Affiliation  PATIENT STRESSORS: Financial difficulties Substance abuse   PROBLEM LIST: Problem List/Patient Goals Date to be addressed Date deferred Reason deferred Estimated date of resolution  I want to get off of alcohol and drugs then into a long term treatment program. 31 Aug 2012     I want to be happy and not have feelings of wanting to die, I want to feel like there is something positive to live for.  31 Aug 2012                                                DISCHARGE CRITERIA:  Ability to meet basic life and health needs Adequate post-discharge living arrangements Improved stabilization in mood, thinking, and/or behavior Motivation to continue treatment in a less acute level of care  PRELIMINARY DISCHARGE PLAN: Attend aftercare/continuing care group Placement in alternative living arrangements  PATIENT/FAMIILY INVOLVEMENT: This treatment plan has been presented to and reviewed with the patient, [name], and/or family member.  The patient and family have been given the opportunity to ask questions and make suggestions.  [name] 08/31/2012, 7:45 PM

## 2012-08-31 NOTE — [facility] Counselor (Signed)
Patient referred to [facility], [facility], [facility], [facility], [facility], [facility], [facility], and [facility]. Patient pending review at all facilities. Patient is also pending a [facility] referral for placement on the wait list.

## 2012-08-31 NOTE — ED Notes (Addendum)
Report called to Beverly, RN at [facility].  ?

## 2012-08-31 NOTE — Progress Notes (Signed)
Patient ID: [name], male   DOB: 11-Feb-1961, 52 y.o.   MRN: [id number] D:  Patient admitted from [facility].  States he has been drinking heavily for about two weeks.  States he went to an out of town wedding and lost his prescribed medicines (risperidone and Lamictal) and started drinking again and using cocaine.  States he had been living with his mother, but she threw him out of the house for his substance abuse.  States he is in the process of trying to get disability.  Patient was offered food and fluids.  He was oriented to the unit and to the unit schedule.  States he wants to go to long term rehab from here. A:  Admission completed, care plan initiated.   R:  Pleasant and cooperative with admission process.  States he is motivated for treatment.
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
## 2012-08-31 NOTE — [facility] Counselor (Signed)
Patient accepted by Dr. Lucianne Muss to Dr. Daleen Bo. Patients room assignment is 502-2. Support paperwork completed and faxed to [facility]. EDP-Dr. Lorre Nick notified of patients disposition. Patients nurse-Jennifer/Eric also notified and will call report to [phone]. Patient is here voluntarily and will be transferred to [facility] via security.

## 2012-09-01 ENCOUNTER — Encounter (HOSPITAL_COMMUNITY): Payer: Self-pay | Admitting: Psychiatry

## 2012-09-01 DIAGNOSIS — F1010 Alcohol abuse, uncomplicated: Secondary | ICD-10-CM

## 2012-09-01 DIAGNOSIS — F1410 Cocaine abuse, uncomplicated: Secondary | ICD-10-CM

## 2012-09-01 DIAGNOSIS — F312 Bipolar disorder, current episode manic severe with psychotic features: Principal | ICD-10-CM

## 2012-09-01 DIAGNOSIS — F4310 Post-traumatic stress disorder, unspecified: Secondary | ICD-10-CM

## 2012-09-01 MED ORDER — CEPHALEXIN 500 MG PO CAPS
500.0000 mg | ORAL_CAPSULE | Freq: Three times a day (TID) | ORAL | Status: AC
  Administered 2012-09-01 – 2012-09-11 (×30): 500 mg via ORAL
  Filled 2012-09-01 (×30): qty 1

## 2012-09-01 MED ORDER — RISPERIDONE 2 MG PO TABS
2.0000 mg | ORAL_TABLET | Freq: Every day | ORAL | Status: DC
  Administered 2012-09-01: 2 mg via ORAL
  Filled 2012-09-01 (×4): qty 1

## 2012-09-01 MED ORDER — HYDROXYZINE HCL 50 MG PO TABS
50.0000 mg | ORAL_TABLET | Freq: Every evening | ORAL | Status: DC | PRN
  Administered 2012-09-01 – 2012-09-12 (×12): 50 mg via ORAL
  Filled 2012-09-01 (×2): qty 1
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
## 2012-09-01 NOTE — Progress Notes (Signed)
Chaplain follow up with pt at [facility].  Pt expressed desire to pray.  Shared prayers and provided support with pt around relationship with mother and concern / fears over where he will live when discharged.   Belva Crome

## 2012-09-01 NOTE — [facility] Group Notes (Signed)
[facility] LCSW Aftercare Discharge Planning Group Note   09/01/2012 8:45 AM  Participation Quality:  Adequate  Mood/Affect:  Anxious and Depressed  Depression Rating:  5  Anxiety Rating:  5  Thoughts of Suicide:  Yes Will you contract for safety?   Yes  Current AVH:  Negative  Plan for Discharge/Comments:  Patient requests inpatient referral  Transportation Means: Bus  Supports: Patient reports no one at present  [place], [name]

## 2012-09-01 NOTE — [facility] Counselor (Signed)
  Adult Psychosocial Assessment Update  Interdisciplinary Team  Previous [facility] admissions/discharges:  Admissions  Discharges   Date:current  Date:   Date: 06-28-12 Date:  07-07-12  Date: 04-20-12 Date:  05-07-12  Date:  Date:   Date:  Date:   Changes since the last Psychosocial Assessment (including adherence to outpatient mental health and/or substance abuse treatment, situational issues contributing to decompensation and/or relapse).  [name] states he recently misplaced his medications while on vacation thus has been off medications for several weeks. He reports drinking significant amounts of alcohol (12 pack daily) and smoking crack cocaine (two to three times weekly $50 each event). He reports both mother and brother brought him here due to fact that he was witnessed lying in street in suicide attempt. Patient also reports break up with girlfriend January of this year is still difficult for him to deal with            Discharge Plan  1. Will you be returning to the same living situation after discharge?  Yes:  No:x If no, what is your plan?requesting referral to rehab          2. Would you like a referral for services when you are discharged?  Yes:x If yes, for what services?  No:    See above       Summary and Recommendations (to be completed by the evaluator)  [name] is a 52 yo unemployed homeless African American male admitted with diagnosis of Major Depression, Recurrent Severe with psychotic feature, Alcohol Dependence and Cocaine Abuse. He is known well by [facility] staff as is his history of Bipolar and substance abuse diagnosis. He has no income and is dependent on his mother and sister for support; feels he has overused the resource and will not be welcomed back. Marland Kitchen He can benefit from medication stabilization, therapeutic milieu, a strong dose of reality therapy, and referral for outpt services.                       Signature: Carney Bern,  LCSWA

## 2012-09-01 NOTE — Progress Notes (Signed)
Recreation Therapy Notes  Date: 04.09.2014 Time: 3:00pm Location: [address] Dayroom      Group Topic/Focus: Decision Making, Problem Solving, Communciation  Participation Level: Active  Participation Quality: Appropriate  Affect: Euthymic to Bright at times  Cognitive: Appropriate   Additional Comments: Activity: Lost at [employer]; Explanation: LRT reads aloud a scenario. Patients are grouped into groups of 3, patients have to prioritize items given through scenario to ensure patient survival.   Patient actively participated in group activity. Patient participated in wrap up discussion about using problem solving skills, decision making skills and communication skills to keep help combat effects of behavioral health concern. Patient smiled and laughed with group members.   [name], LRT/CTRS  Jearl Klinefelter 09/01/2012 4:58 PM

## 2012-09-01 NOTE — [facility] Group Notes (Signed)
[facility] LCSW Group Therapy      Emotional Regulation 1:15 - 2:30 PM         09/01/2012 3:18 PM  Type of Therapy:  Group Therapy  Participation Level:  Active  Participation Quality:  Appropriate and Attentive  Affect:  Appropriate  Cognitive:  Appropriate  Insight:  Engaged  Engagement in Therapy:  Engaged  Modes of Intervention:  Confrontation, Exploration, Problem-solving, Rapport Building and Support  Summary of Progress/Problems:  Patient shared he has difficult regulating his emotions when he and his mother/family are not speaking.  He shared when that happens, he becomes depressed and suicidal.  Patient shared it does not matter what he does, he is not able to please his mother.    Patient was reminded the only person he can change is himself and that he may have to learn to love his mother at a distance.  Wynn Banker 09/01/2012, 3:18 PM

## 2012-09-01 NOTE — Tx Team (Signed)
Interdisciplinary Treatment Plan Update (Adult)  Date: 09/01/2012  Time Reviewed:10:10 AM   Progress in Treatment: Attending groups: Yes Participating in groups: Yes Taking medication as prescribed:  Yes Tolerating medication:  Yes Family/Significant othe contact made: No, patient refuses Patient understands diagnosis: Yes Discussing patient identified problems/goals with staff: Yes Medical problems stabilized or resolved:  Yes Denies suicidal/homicidal ideation: Yes Patient has not harmed self or Others: Yes  New problem(s) identified: None Identified  Discharge Plan or Barriers:  CSW is assessing for appropriate referrals. Patient wants inpatient referral to treatment.  ACT Team to assess today  Additional comments: N/A  Reason for Continuation of Hospitalization: Depression and Anxiety Medication stabilization Suicidal ideation Withdrawal symptoms   Estimated length of stay: 3 days  For review of initial/current patient goals, please see plan of care.  Attendees: Patient:  [name] 09/01/2012 10:09 AM  Family:     Physician:  Geoffery Lyons 09/01/2012 10:09 AM   Nursing:   Burnetta Sabin, RN 09/01/2012 10:09 AM   Clinical Social Worker Ronda Fairly 09/01/2012 10:09 AM    Other:  Leland Johns Intern 09/01/2012 10:10 AM   Other:  Georgina Quint, [place] PA Student 09/01/2012 10:10 AM   Other: Serena Colonel, PA 09/01/2012 10:10 AM    Other:   Maryjean Morn, PA 09/01/2012 10:10 AM    Other: Titus Mould, Community Care Coordinator            09/01/2012 10:10 AM  Scribe for Treatment Team:   Carney Bern, LCSWA  09/01/2012 10:10 AM

## 2012-09-01 NOTE — Progress Notes (Signed)
Pt laying in bed resting with eyes closed. Respirations even and unlabored. No distress noted.  

## 2012-09-01 NOTE — [facility] Suicide Risk Assessment (Signed)
Suicide Risk Assessment  Admission Assessment     Nursing information obtained from:  Patient Demographic factors:  Male;Low socioeconomic status;Living alone;Unemployed Current Mental Status:  Self-harm thoughts;Self-harm behaviors Loss Factors:  Financial problems / change in socioeconomic status Historical Factors:  Prior suicide attempts;Family history of mental illness or substance abuse Risk Reduction Factors:  Sense of responsibility to family;Religious beliefs about death  CLINICAL FACTORS:   Bipolar Disorder:   Depressive phase Alcohol/Substance Abuse/Dependencies  COGNITIVE FEATURES THAT CONTRIBUTE TO RISK:  Closed-mindedness Polarized thinking Thought constriction (tunnel vision)    SUICIDE RISK:   Moderate:  Frequent suicidal ideation with limited intensity, and duration, some specificity in terms of plans, no associated intent, good self-control, limited dysphoria/symptomatology, some risk factors present, and identifiable protective factors, including available and accessible social support.  PLAN OF CARE:  Supportive approach/coping skills/relapse prevention                               Reassess medications                               Consider a Depot formulation due to poor compliance  I certify that inpatient services furnished can reasonably be expected to improve the patient's condition.  Justis Closser A 09/01/2012, 5:54 PM

## 2012-09-01 NOTE — Progress Notes (Signed)
  D) Patient bright and animated upon my assessment. Patient completed Patient Self Inventory, reports appetite is "poor." Patient had lunch in room, finished plate and dessert provided by cafeteria. Patient rates depression as   10/10, patient rates hopeless feelings as  10/10. Patient endorses "constant SI," contracts verbally for safety with RN. Patient denies HI, denies A/V hallucinations.   A) Patient offered support and encouragement, patient encouraged to discuss feelings/concerns with staff. Patient verbalized understanding. Patient monitored Q15 minutes for safety. Patient met with MD  to discuss today's goals and plan of care.  R) Patient visible in milieu, attending groups in day room and meals in dining room. Patient observed interacting with peers, smiling, laughing and joking when staff not present. Patient appropriate with staff and peers.   Patient taking medications as ordered. Will continue to monitor.

## 2012-09-01 NOTE — Progress Notes (Signed)
Adult Psychoeducational Group Note  Date:  09/01/2012 Time:  11:52 AM  Group Topic/Focus:  Crisis Planning:   The purpose of this group is to help patients create a crisis plan for use upon discharge or in the future, as needed.  Participation Level:  Minimal  Participation Quality:  Drowsy  Affect:  Depressed  Cognitive:  Oriented  Insight: Good  Engagement in Group:  Limited  Modes of Intervention:  Discussion, Education and Support  Additional Comments:  Pt came to group but participation was limited. Pt does seems motivated for change   [name] T 09/01/2012, 11:52 AM

## 2012-09-02 MED ORDER — ARIPIPRAZOLE 5 MG PO TABS
5.0000 mg | ORAL_TABLET | Freq: Every day | ORAL | Status: DC
  Administered 2012-09-02 – 2012-09-03 (×2): 5 mg via ORAL
  Filled 2012-09-02 (×4): qty 1

## 2012-09-02 MED ORDER — ARIPIPRAZOLE 5 MG PO TABS: 5.0000 mg | ORAL_TABLET | Freq: Once | ORAL | Status: DC

## 2012-09-02 MED ORDER — MIRTAZAPINE 15 MG PO TBDP
15.0000 mg | ORAL_TABLET | Freq: Every day | ORAL | Status: DC
  Administered 2012-09-02 – 2012-09-12 (×11): 15 mg via ORAL
  Filled 2012-09-02 (×10): qty 1
  Filled 2012-09-02: qty 14
  Filled 2012-09-02 (×2): qty 1

## 2012-09-02 NOTE — [facility] Group Notes (Signed)
[facility] LCSW Aftercare Discharge Planning Group Note   09/02/2012 12:32 PM  Participation Quality:  Appropriate  Mood/Affect:  Appropriate  Depression Rating:  5  Anxiety Rating:  5  Thoughts of Suicide:  No  Will you contract for safety?   NA  Current AVH:  No  Plan for Discharge/Comments:    Patient reports feeling better today after speaking with sister.  He is hopeful to get into [facility] for treatment.  He was advised we are awaiting call back from the facility.  Transportation Means: Agricultural consultant.  Supports:  Limited support system.  [name], [name]

## 2012-09-02 NOTE — Clinical Social Work Note (Signed)
Writer spoke with [name] at [facility].  He declined to accept patient for admission due to past experience with patient at their facility.  Patient advised.

## 2012-09-02 NOTE — Progress Notes (Signed)
[facility] MD Progress Note  09/02/2012 4:08 PM [name]  MRN:  [id number] Subjective:  Sates that he misses the mania. Admits that he likes the "high" that he gets from the cocaine. States that he knows that what goes up has to come down. He also shares that he likes to get involved in activities that are dangerous, likes the rush. He admits to compliance with medications when he is at the hospital but not so much when he is out. Still hesitant to try a depot preparation as claims "needle phobia." Wants a stable place to stay and hopefully to end the cycle of his decompensations and relapses Diagnosis:  Bipolar I Disorder, Cocaine Dependence, Alcohol Abuse  ADL's:  Intact  Sleep: Poor  Appetite:  Fair  Suicidal Ideation:  Plan:  denies Intent:  denies Means:  denies Homicidal Ideation:  Plan:  denies Intent:  denies Means:  denies AEB (as evidenced by):  Psychiatric Specialty Exam: Review of Systems  Constitutional: Negative.   HENT: Negative.   Eyes: Negative.   Respiratory: Negative.   Cardiovascular: Negative.   Gastrointestinal: Negative.   Genitourinary: Negative.   Musculoskeletal: Negative.   Skin: Negative.   Neurological: Negative.   Endo/Heme/Allergies: Negative.   Psychiatric/Behavioral: Positive for suicidal ideas and substance abuse. The patient is nervous/anxious and has insomnia.     Blood pressure 104/56, pulse 88, temperature 97.7 F (36.5 C), temperature source Oral, resp. rate 20, height 6\' 1"  (1.854 m), weight 105.688 kg (233 lb).Body mass index is 30.75 kg/(m^2).  General Appearance: Fairly Groomed  Patent attorney::  Fair  Speech:  Clear and Coherent and Pressured  Volume:  fluctuates  Mood:  Irritable and hyperthymic  Affect:  Restricted  Thought Process:  Coherent and Goal Directed  Orientation:  Full (Time, Place, and Person)  Thought Content:  worries, concerns  Suicidal Thoughts:  Yes.  without intent/plan  Homicidal Thoughts:  No  Memory:   Immediate;   Fair Recent;   Fair Remote;   Fair  Judgement:  Poor  Insight:  superficial  Psychomotor Activity:  Restlessness  Concentration:  Fair  Recall:  Fair  Akathisia:  No  Handed:  Right  AIMS (if indicated):     Assets:  Desire for Improvement  Sleep:  Number of Hours: 5   Current Medications: Current Facility-Administered Medications  Medication Dose Route Frequency Provider Last Rate Last Dose  . acetaminophen (TYLENOL) tablet 650 mg  650 mg Oral Q6H PRN Kerry Hough, PA-C   650 mg at 09/02/12 0805  . alum & mag hydroxide-simeth (MAALOX/MYLANTA) 200-200-20 MG/5ML suspension 30 mL  30 mL Oral Q4H PRN Kerry Hough, PA-C      . ARIPiprazole (ABILIFY) tablet 5 mg  5 mg Oral Daily Rachael Fee, MD   5 mg at 09/02/12 1556  . cephALEXin (KEFLEX) capsule 500 mg  500 mg Oral TID Sanjuana Kava, NP   500 mg at 09/02/12 1157  . hydrOXYzine (ATARAX/VISTARIL) tablet 50 mg  50 mg Oral QHS PRN Rachael Fee, MD   50 mg at 09/01/12 2308  . magnesium hydroxide (MILK OF MAGNESIA) suspension 30 mL  30 mL Oral Daily PRN Kerry Hough, PA-C      . mirtazapine (REMERON SOL-TAB) disintegrating tablet 15 mg  15 mg Oral QHS Rachael Fee, MD      . valACYclovir (VALTREX) tablet 1,000 mg  1,000 mg Oral BID Kerry Hough, PA-C   1,000 mg at 09/02/12 [phone]  Lab Results: No results found for this or any previous visit (from the past 48 hour(s)).  Physical Findings: AIMS: Facial and Oral Movements Muscles of Facial Expression: None, normal Lips and Perioral Area: None, normal Jaw: None, normal Tongue: None, normal,Extremity Movements Upper (arms, wrists, hands, fingers): None, normal Lower (legs, knees, ankles, toes): None, normal, Trunk Movements Neck, shoulders, hips: None, normal, Overall Severity Severity of abnormal movements (highest score from questions above): None, normal Incapacitation due to abnormal movements: None, normal Patient's awareness of abnormal movements (rate  only patient's report): No Awareness, Dental Status Current problems with teeth and/or dentures?: No Does patient usually wear dentures?: Yes  CIWA:  CIWA-Ar Total: 8 COWS:     Treatment Plan Summary: Daily contact with patient to assess and evaluate symptoms and progress in treatment Medication management  Plan: Supportive approach/coping skills/relapse prevention           Start Abilify 5 mg with plans to start the Depo preparation if he experiences no side effects  Medical Decision Making Problem Points:  Review of last therapy session (1) and Review of psycho-social stressors (1) Data Points:  Review of medication regiment & side effects (2) Review of new medications or change in dosage (2)  I certify that inpatient services furnished can reasonably be expected to improve the patient's condition.   [name] A 09/02/2012, 4:08 PM

## 2012-09-02 NOTE — [facility] Group Notes (Signed)
[facility] LCSW Group Therapy  09/02/2012 12:42 PM            [facility] [place] 1:15 - 2:30 PM   Type of Therapy:  Group Therapy  Participation Level:  Active  Participation Quality:  Appropriate  Affect:  Appropriate and Depressed  Cognitive:  Appropriate  Insight:  Engaged  Engagement in Therapy:  Engaged  Modes of Intervention:  Discussion, Education, Exploration, Problem-solving, Rapport Building and Support  Summary of Progress/Problems:   Patient listened attentively to speaker from [employer]. Patient shared that he hopes to someday be able to someday be able to tell his story to others.  Wynn Banker 09/02/2012, 12:42 PM

## 2012-09-02 NOTE — Progress Notes (Signed)
Adult Psychoeducational Group Note  Date:  09/02/2012 Time:  6:28 PM  Group Topic/Focus:  Overcoming Stress:   The focus of this group is to define stress and help patients assess their triggers.  Participation Level:  None  Participation Quality:  Drowsy  Affect:  Not Congruent  Cognitive:  Appropriate  Insight: None  Engagement in Group:  None  Modes of Intervention:  Activity, Discussion, Education, Socialization and Support  Additional Comments:  [name] attended group and did not share or participate during group. Patient was asked to share his term of stress. Stress interview was performed by partners in the group, patient got up from group and stated he was going to bathroom and did not return.  Karleen Hampshire Brittini 09/02/2012, 6:28 PM

## 2012-09-02 NOTE — Progress Notes (Signed)
Patient ID: Man Effertz, male   DOB: 08-Jun-1960, 52 y.o.   MRN: [id number]  D: Pt was loud and discussing religion in the dayroom. Pt informed the writer of changes made to his medication. Writer attempted to encourage pt to think about getting inj for medication. Pt acted as if he was giving me an inj and stated, "you want some aids?" Pt asked the writer if she remembered the [employer].  A:  Support and encouragement was offered. 15 min checks continued for safety.  R: Pt remains safe.

## 2012-09-02 NOTE — Progress Notes (Signed)
Pt actively participated in [place] group. Pt was attentive and supportive of peers.

## 2012-09-02 NOTE — Progress Notes (Signed)
Adult Psychoeducational Group Note  Date:  09/02/2012 Time:  1000  Group Topic/Focus:  Coping With Mental Health Crisis:   The purpose of this group is to help patients identify strategies for coping with mental health crisis.  Group discusses possible causes of crisis and ways to manage them effectively.  Participation Level:  Active  Participation Quality:  Appropriate, Attentive, Sharing and Supportive  Affect:  Appropriate  Cognitive:  Alert, Appropriate and Oriented  Insight: Appropriate and Good  Engagement in Group:  Engaged and Supportive  Modes of Intervention:  Activity, Discussion and Education  Additional Comments:  Patient pleasant and appropriate during group.   Noah Charon 09/02/2012, 10:57 AM

## 2012-09-03 ENCOUNTER — Encounter (HOSPITAL_COMMUNITY): Payer: Self-pay | Admitting: Registered Nurse

## 2012-09-03 DIAGNOSIS — R45851 Suicidal ideations: Secondary | ICD-10-CM

## 2012-09-03 DIAGNOSIS — F329 Major depressive disorder, single episode, unspecified: Secondary | ICD-10-CM

## 2012-09-03 DIAGNOSIS — F411 Generalized anxiety disorder: Secondary | ICD-10-CM

## 2012-09-03 DIAGNOSIS — F3289 Other specified depressive episodes: Secondary | ICD-10-CM

## 2012-09-03 MED ORDER — IBUPROFEN 600 MG PO TABS
600.0000 mg | ORAL_TABLET | Freq: Four times a day (QID) | ORAL | Status: DC | PRN
  Administered 2012-09-03 – 2012-09-13 (×14): 600 mg via ORAL
  Filled 2012-09-03 (×14): qty 1

## 2012-09-03 MED ORDER — ARIPIPRAZOLE 5 MG PO TABS
5.0000 mg | ORAL_TABLET | Freq: Two times a day (BID) | ORAL | Status: DC
  Administered 2012-09-03 – 2012-09-07 (×9): 5 mg via ORAL
  Filled 2012-09-03 (×12): qty 1

## 2012-09-03 NOTE — Progress Notes (Signed)
Adult Psychoeducational Group Note  Date:  09/03/2012 Time:  11:12 AM  Group Topic/Focus:  Making Healthy Choices:   The focus of this group is to help patients identify negative/unhealthy choices they were using prior to admission and identify positive/healthier coping strategies to replace them upon discharge.  Participation Level:  Minimal  Participation Quality:  Drowsy  Affect:  Appropriate  Cognitive:  Oriented  Insight: Improving  Engagement in Group:  Lacking  Modes of Intervention:  Activity, Discussion and Support  Additional Comments:  Pt stated his goal is to get his medication adjusted.  Kary Colaizzi T 09/03/2012, 11:12 AM

## 2012-09-03 NOTE — Progress Notes (Signed)
Patient ID: [name], male   DOB: 15-Mar-1961, 52 y.o.   MRN: [id number]  D: Patient pleasant on approach tonight. Interacting well with staff and peers. No s/s of depression except per patient report. Passive SI on and off. No psychosis noted at present. A: Staff will monitor on q 15 minute checks, follow treatment plan, and give meds as ordered. R: Taking meds without issue tonight. No distress at present.

## 2012-09-03 NOTE — Progress Notes (Addendum)
Chaplain saw pt during outdoor recreation.  Pt spoke with chaplain about his triggers, detailing some tension in his family which contributes to his feeling overwhelmed and coping via substance use.  Pt spoke with chaplain about finding supportive community outside of [facility] who can recognize when he is withdrawing - which he identifies as a response to stress.    Faith figures prominently in pt's life / recovery.  Chaplain provided spiritual support and spoke with pt about prayer and scriptures he finds helpful.   Belva Crome MDiv

## 2012-09-03 NOTE — Progress Notes (Signed)
D: Patient having passive SI but verbally agrees to contract for safety. The patient denies any HI and auditory and visual hallucinations. The patient has an appropriate mood and affect but during interactions with staff, the patient appears sad and depressed. The patient rates his hopelessness a 10 out of 10 (1 low/10 high) but appears normal and appropriate on unit. Patient is attending groups and interacting appropriately within the milieu.  A: Patient given emotional support from RN. Patient encouraged to come to staff with concerns and/or questions. Patient's medication routine continued. Patient's orders and plan of care reviewed.  R: Patient remains cooperative. Will continue to monitor patient q15 minutes for safety.

## 2012-09-03 NOTE — Progress Notes (Signed)
Patient ID: [name], male   DOB: 1960-06-14, 52 y.o.   MRN: [id number] D. The patient is bright and social, interacting appropriately in the milieu. He attended the evening AA/NA group. Reports he relapsed two weeks ago when he was out of town on vacation. Reports that his family is upset with him. His plan is to get back on his medication and go to a long term program after discharge. A. Met with patient 1:1 to assess. Reviewed treatment plan. Encouraged to attend evening group. Administered HS medications. R. The patient attended and actively participated in evening group. Compliant with medications.

## 2012-09-03 NOTE — Progress Notes (Signed)
Patient ID: [name], male   DOB: 1960-10-17, 52 y.o.   MRN: [id number] [facility] MD Progress Note  09/03/2012 11:49 AM [name]  MRN:  [id number] Subjective:  "I'm trying to get my meds straight.  SI thoughts still coming and going I am still manic coming and going.  I saw the act team member and they are suppose to see if they can find me some place to go.  Family pretty angry with me right now for not taking my meds and going through this again.  They are disappointed and I am angry and disappointed in my self." ADL's:  Intact  Sleep: Poor  Appetite:  Fair  Suicidal Ideation:  Plan:  denies Intent:  denies Means:  denies Homicidal Ideation:  Plan:  denies Intent:  denies Means:  denies AEB (as evidenced by):  Patient continues to participate in group sessions and tolerating medication without adverse effects.  "Some times I just get tired of taking meds that don't work and sometimes I think the only way out is to kill my self."  " I just started on the Abilify"  Patient states that after last discharge he went to [facility] and even took his medications and several therapy sessions but he went out of town and everything changed after that.   Patient also states that Ibuprofen helps with his knee pain better than Tylenol.  Psychiatric Specialty Exam: Review of Systems  Musculoskeletal: Positive for joint pain (Knee pain).  Psychiatric/Behavioral: Positive for depression and suicidal ideas. The patient is nervous/anxious.     Blood pressure 113/77, pulse 91, temperature 98 F (36.7 C), temperature source Oral, resp. rate 20, height 6\' 1"  (1.854 m), weight 105.688 kg (233 lb).Body mass index is 30.75 kg/(m^2).  General Appearance: Fairly Groomed  Patent attorney::  Fair  Speech:  Clear and Coherent and Pressured  Volume:  fluctuates  Mood:  Irritable and hyperthymic  Affect:  Restricted  Thought Process:  Coherent and Goal Directed  Orientation:  Full (Time, Place, and Person)   Thought Content:  worries, concerns  Suicidal Thoughts:  Yes.  without intent/plan  Homicidal Thoughts:  No  Memory:  Immediate;   Fair Recent;   Fair Remote;   Fair  Judgement:  Poor  Insight:  superficial  Psychomotor Activity:  Restlessness  Concentration:  Fair  Recall:  Fair  Akathisia:  No  Handed:  Right  AIMS (if indicated):     Assets:  Desire for Improvement Social Support  Sleep:  Number of Hours: 5   Current Medications: Current Facility-Administered Medications  Medication Dose Route Frequency Provider Last Rate Last Dose  . acetaminophen (TYLENOL) tablet 650 mg  650 mg Oral Q6H PRN Kerry Hough, PA-C   650 mg at 09/02/12 0805  . alum & mag hydroxide-simeth (MAALOX/MYLANTA) 200-200-20 MG/5ML suspension 30 mL  30 mL Oral Q4H PRN Kerry Hough, PA-C      . ARIPiprazole (ABILIFY) tablet 5 mg  5 mg Oral Daily Rachael Fee, MD   5 mg at 09/03/12 0806  . cephALEXin (KEFLEX) capsule 500 mg  500 mg Oral TID Sanjuana Kava, NP   500 mg at 09/03/12 1148  . hydrOXYzine (ATARAX/VISTARIL) tablet 50 mg  50 mg Oral QHS PRN Rachael Fee, MD   50 mg at 09/02/12 2308  . magnesium hydroxide (MILK OF MAGNESIA) suspension 30 mL  30 mL Oral Daily PRN Kerry Hough, PA-C      . mirtazapine (REMERON SOL-TAB)  disintegrating tablet 15 mg  15 mg Oral QHS Rachael Fee, MD   15 mg at 09/02/12 2200  . valACYclovir (VALTREX) tablet 1,000 mg  1,000 mg Oral BID Kerry Hough, PA-C   1,000 mg at 09/03/12 6962    Lab Results: No results found for this or any previous visit (from the past 48 hour(s)).  Physical Findings: AIMS: Facial and Oral Movements Muscles of Facial Expression: None, normal Lips and Perioral Area: None, normal Jaw: None, normal Tongue: None, normal,Extremity Movements Upper (arms, wrists, hands, fingers): None, normal Lower (legs, knees, ankles, toes): None, normal, Trunk Movements Neck, shoulders, hips: None, normal, Overall Severity Severity of abnormal  movements (highest score from questions above): None, normal Incapacitation due to abnormal movements: None, normal Patient's awareness of abnormal movements (rate only patient's report): No Awareness, Dental Status Current problems with teeth and/or dentures?: No Does patient usually wear dentures?: Yes  CIWA:  CIWA-Ar Total: 8 COWS:     Treatment Plan Summary: Daily contact with patient to assess and evaluate symptoms and progress in treatment Medication management  Plan: Supportive approach/coping skills/relapse prevention           Start Abilify 5 mg with plans to start the Depo preparation if he experiences no side effects Will continue current treatment plan Medical Decision Making Problem Points:  Established problem, stable/improving (1), Review of last therapy session (1) and Review of psycho-social stressors (1) Data Points:  Discuss tests with performing physician (1) Independent review of image, tracing, or specimen (2) Review and summation of old records (2) Review of medication regiment & side effects (2) Review or order of Psychological tests (1)  I certify that inpatient services furnished can reasonably be expected to improve the patient's condition.   Jordan Caraveo B. Geana Walts FNP-BC Family Nurse Practitioner, Board Certified   Adrain Butrick 09/03/2012, 11:49 AM

## 2012-09-03 NOTE — [facility] Group Notes (Signed)
[facility] LCSW Group Therapy  09/03/2012 1:15 PM  Type of Therapy:  Group Therapy  Participation Level:  Active  Participation Quality:  Appropriate and Attentive  Affect:  Appropriate  Cognitive:  Alert and Appropriate  Insight:  Developing/Improving and Engaged  Engagement in Therapy:  Developing/Improving and Engaged  Modes of Intervention:  Clarification, Confrontation, Discussion, Education, Exploration, Limit-setting, Orientation, Problem-solving, Rapport Building, Dance movement psychotherapist, Socialization and Support  Summary of Progress/Problems: The topic for today was feelings about relapse.  Pt discussed what relapse prevention is to them and identified triggers that they are on the path to relapse.  Pt processed their feeling towards relapse and was able to relate to peers.  Pt discussed coping skills that can be used for relapse prevention.   Pt participated in group discussion about putting yourself first in order to take better care of themselves.  With this discussion, pt discussed learning to say no to others and self care.  Pt discussed goals they had to get to where they want to be.  Pt shared that he is working on putting God first, as he often relies on other and looks to them as false gods.  Pt was an active participant on group but at times could be intrusive, interrupting when others are sharing.    Carmina Miller 09/03/2012, 2:00 PM

## 2012-09-04 NOTE — [facility] Group Notes (Signed)
[facility] Group Notes: (Clinical Social Work)   09/04/2012      Type of Therapy:  Group Therapy   Participation Level:  Did Not Attend    Ambrose Mantle, LCSW 09/04/2012, 4:13 PM

## 2012-09-04 NOTE — Progress Notes (Signed)
[facility] Group Notes:  (Nursing/MHT/Case Management/Adjunct)  Date:  09/04/2012  Time:  2000  Type of Therapy:  Psychoeducational Skills  Participation Level:  Minimal  Participation Quality:  Resistant  Affect:  Anxious  Cognitive:  Appropriate  Insight:  Lacking  Engagement in Group:  Distracting  Modes of Intervention:  Education  Summary of Progress/Problems: The patient had attempted to attend the A.A. Meeting,but returned to his hallway since the guest speaker failed to appear. He verbalized this evening that he learned a great deal from the groups. In addition, he expressed that he tried to rest as much as possible today. His goal for tomorrow is to try to get more rest.   Janeene Sand S 09/04/2012, 11:38 PM

## 2012-09-04 NOTE — Progress Notes (Signed)
Adult Psychoeducational Group Note  Date:  09/03/2012 Time:  1100am  Group Topic/Focus:  Relapse Prevention Planning:   The focus of this group is to define relapse and discuss the need for planning to combat relapse.  Participation Level:  Did Not Attend  Participation Quality:    Affect:    Cognitive:    Insight:   Engagement in Group:    Modes of Intervention:    Additional Comments:  Pt was sleeping, refused to attend.  Isla Pence M 09/04/2012, 6:48 PM

## 2012-09-04 NOTE — Progress Notes (Signed)
[facility] MD Progress Note  09/04/2012 4:41 PM [name]  MRN:  [id number] Subjective:  [name] is feeling very depressed today and hopeless about his situation. Patient is ashamed that he has been a patient at [facility] so many times. Patient stated "They were not even going to take me a first." He rates his depression at ten and is still having thoughts of walking into traffic. Patient states "I just want something to change. To break this cycle. I'm so tired of it as well, it's not just my family that is fed up with me. I just need to try something different." Patient was able to express some optimism about having an ACT team follow him after discharge. He is also hopeful that the abilify will help stabilize his mood.  Diagnosis:   Axis I: Bipolar, Manic, PTSD, Cocaine Abuse Axis II: Deferred Axis III:  Past Medical History  Diagnosis Date  . Depression   . Arthritis   . Bipolar 1 disorder   . PTSD (post-traumatic stress disorder)   . Schizophrenia    Axis IV: housing problems, other psychosocial or environmental problems and problems with primary support group Axis V: 51-60 moderate symptoms  ADL's:  Intact  Sleep: Fair  Appetite:  Good  Suicidal Ideation:  Plan:  Walk into traffic Intent:  Unable to contract for safety outside of hospital Means:  None currently due to hospitalization Homicidal Ideation:  Denies AEB (as evidenced by):  Psychiatric Specialty Exam: Review of Systems  Constitutional: Negative.   HENT: Negative.   Eyes: Negative.   Respiratory: Negative.   Cardiovascular: Negative.   Gastrointestinal: Negative.   Genitourinary: Negative.   Musculoskeletal: Negative.   Skin: Negative.   Neurological: Negative.   Endo/Heme/Allergies: Negative.   Psychiatric/Behavioral: Positive for depression, suicidal ideas and substance abuse. Negative for hallucinations and memory loss. The patient is nervous/anxious and has insomnia.     Blood pressure 111/65, pulse 94,  temperature 98 F (36.7 C), temperature source Oral, resp. rate 18, height 6\' 1"  (1.854 m), weight 105.688 kg (233 lb).Body mass index is 30.75 kg/(m^2).  General Appearance: Casual and Neat  Eye Contact::  Good  Speech:  Pressured  Volume:  Normal  Mood:  Anxious, Depressed and Hopeless  Affect:  Congruent  Thought Process:  Goal Directed and Intact  Orientation:  Full (Time, Place, and Person)  Thought Content:  Paranoid Ideation  Suicidal Thoughts:  Yes.  with intent/plan  Homicidal Thoughts:  No  Memory:  Immediate;   Good Recent;   Good Remote;   Good  Judgement:  Impaired  Insight:  Fair  Psychomotor Activity:  Normal  Concentration:  Good  Recall:  Fair  Akathisia:  No  Handed:  Right  AIMS (if indicated):     Assets:  Communication Skills Desire for Improvement Financial Resources/Insurance Leisure Time Physical Health Resilience Social Support Talents/Skills  Sleep:  Number of Hours: 5   Current Medications: Current Facility-Administered Medications  Medication Dose Route Frequency Provider Last Rate Last Dose  . acetaminophen (TYLENOL) tablet 650 mg  650 mg Oral Q6H PRN Kerry Hough, PA-C   650 mg at 09/02/12 0805  . alum & mag hydroxide-simeth (MAALOX/MYLANTA) 200-200-20 MG/5ML suspension 30 mL  30 mL Oral Q4H PRN Kerry Hough, PA-C      . ARIPiprazole (ABILIFY) tablet 5 mg  5 mg Oral BID Rachael Fee, MD   5 mg at 09/04/12 0837  . cephALEXin (KEFLEX) capsule 500 mg  500 mg Oral  TID Sanjuana Kava, NP   500 mg at 09/04/12 1202  . hydrOXYzine (ATARAX/VISTARIL) tablet 50 mg  50 mg Oral QHS PRN Rachael Fee, MD   50 mg at 09/03/12 2207  . ibuprofen (ADVIL,MOTRIN) tablet 600 mg  600 mg Oral Q6H PRN Shuvon Rankin, NP   600 mg at 09/04/12 3086  . magnesium hydroxide (MILK OF MAGNESIA) suspension 30 mL  30 mL Oral Daily PRN Kerry Hough, PA-C      . mirtazapine (REMERON SOL-TAB) disintegrating tablet 15 mg  15 mg Oral QHS Rachael Fee, MD   15 mg at 09/03/12  2206  . valACYclovir (VALTREX) tablet 1,000 mg  1,000 mg Oral BID Kerry Hough, PA-C   1,000 mg at 09/04/12 5784    Lab Results: No results found for this or any previous visit (from the past 48 hour(s)).  Physical Findings: AIMS: Facial and Oral Movements Muscles of Facial Expression: None, normal Lips and Perioral Area: None, normal Jaw: None, normal Tongue: None, normal,Extremity Movements Upper (arms, wrists, hands, fingers): None, normal Lower (legs, knees, ankles, toes): None, normal, Trunk Movements Neck, shoulders, hips: None, normal, Overall Severity Severity of abnormal movements (highest score from questions above): None, normal Incapacitation due to abnormal movements: None, normal Patient's awareness of abnormal movements (rate only patient's report): No Awareness, Dental Status Current problems with teeth and/or dentures?: No Does patient usually wear dentures?: Yes  CIWA:  CIWA-Ar Total: 8 COWS:     Treatment Plan Summary: Daily contact with patient to assess and evaluate symptoms and progress in treatment Medication management Supportive approach/coping skills/relapse prevention Will recommend a Depot preparation to improve compliance Plan: Continue crisis management and stabilization.  Medication management: Patient started on Abilify 5 mg bid, which he is tolerating well. Patient has Vistaril 50 mg in place for sleep.  Encouraged patient to attend groups and participate in group counseling sessions and activities.  Discharge plan in progress.  Address health issues: Vitals reviewed and stable.  Continue current treatment plan.   Medical Decision Making Problem Points:  Established problem, stable/improving (1) and Review of psycho-social stressors (1) Data Points:  Review of new medications or change in dosage (2)  I certify that inpatient services furnished can reasonably be expected to improve the patient's condition.   Fransisca Kaufmann ANN NP-C 09/04/2012,  4:41 PM

## 2012-09-04 NOTE — Progress Notes (Signed)
The patient attended the A.A. Meeting on the 300 hallway this evening.  

## 2012-09-05 NOTE — Progress Notes (Signed)
[name] remains hyperverbal, impulsive and has a labile mood....frequently rembling and interjecting in conversations and then saying " I'm sorry...ibuprofen didn't meant to say that". HE is quite pleasant  And appreciative of any and all attention given him. HE is compliant with his POC, ie he attends his groups, he is engaged in the groups discussion and activities and he demonstrates insight into understanding his feelings as they relate to his poor choices and unhealthy behaviors.      A He completes his AM self inventory and on it he writes he cont to have " off and on " SI, but he contracts to stay safe with this nurse as soon as he is approached by this writer this morning.  He rates his depression and hopelessness " 6 / 10 " and states his DC plan is to : cont with AA mtg".      R Safety is in place and POC cont with thera peutic relationship fostereed .

## 2012-09-05 NOTE — Progress Notes (Signed)
[facility] MD Progress Note  09/05/2012 3:14 PM [name]  MRN:  [id number] Subjective:  [name] states that he does not "feel the Abilify."  States that he is not as manic and that his thinking in not going as fast. States that he tends to isolate when he knows he cant control the thoughts in his head or the speed of his speech. States that both things are better. Willing to continue the Abilify and see how much it can do for him. He did sleep last night Diagnosis:  Bipolar Disorder, Alcohol, Cocaine Abuse  ADL's:  Intact  Sleep: Fair  Appetite:  Fair  Suicidal Ideation:  Plan:  fair Intent:  fair Means:  fair Homicidal Ideation:  Plan:  fair Intent:  fair Means:  fair AEB (as evidenced by):  Psychiatric Specialty Exam: Review of Systems  Constitutional: Negative.   HENT: Negative.   Eyes: Negative.   Respiratory: Negative.   Cardiovascular: Negative.   Gastrointestinal: Negative.   Genitourinary: Negative.   Musculoskeletal: Negative.   Skin: Negative.   Neurological: Negative.   Endo/Heme/Allergies: Negative.   Psychiatric/Behavioral: Positive for depression and substance abuse. The patient is nervous/anxious.     Blood pressure 114/80, pulse 85, temperature 98.3 F (36.8 C), temperature source Oral, resp. rate 18, height 6\' 1"  (1.854 m), weight 105.688 kg (233 lb).Body mass index is 30.75 kg/(m^2).  General Appearance: Fairly Groomed  Patent attorney::  Fair  Speech:  Clear and Coherent and rapid  Volume:  fluctuates  Mood:  Anxious  Affect:  anxious  Thought Process:  Coherent and Goal Directed  Orientation:  Full (Time, Place, and Person)  Thought Content:  worries, concerns  Suicidal Thoughts:  No  Homicidal Thoughts:  No  Memory:  Immediate;   Fair Recent;   Fair Remote;   Fair  Judgement:  Fair  Insight:  Present  Psychomotor Activity:  Restlessness  Concentration:  Fair  Recall:  Fair  Akathisia:  No  Handed:  Right  AIMS (if indicated):     Assets:   Desire for Improvement  Sleep:  Number of Hours: 5   Current Medications: Current Facility-Administered Medications  Medication Dose Route Frequency Provider Last Rate Last Dose  . acetaminophen (TYLENOL) tablet 650 mg  650 mg Oral Q6H PRN Kerry Hough, PA-C   650 mg at 09/02/12 0805  . alum & mag hydroxide-simeth (MAALOX/MYLANTA) 200-200-20 MG/5ML suspension 30 mL  30 mL Oral Q4H PRN Kerry Hough, PA-C      . ARIPiprazole (ABILIFY) tablet 5 mg  5 mg Oral BID Rachael Fee, MD   5 mg at 09/05/12 0947  . cephALEXin (KEFLEX) capsule 500 mg  500 mg Oral TID Sanjuana Kava, NP   500 mg at 09/05/12 1149  . hydrOXYzine (ATARAX/VISTARIL) tablet 50 mg  50 mg Oral QHS PRN Rachael Fee, MD   50 mg at 09/04/12 2257  . ibuprofen (ADVIL,MOTRIN) tablet 600 mg  600 mg Oral Q6H PRN Shuvon Rankin, NP   600 mg at 09/05/12 0948  . magnesium hydroxide (MILK OF MAGNESIA) suspension 30 mL  30 mL Oral Daily PRN Kerry Hough, PA-C      . mirtazapine (REMERON SOL-TAB) disintegrating tablet 15 mg  15 mg Oral QHS Rachael Fee, MD   15 mg at 09/04/12 2257  . valACYclovir (VALTREX) tablet 1,000 mg  1,000 mg Oral BID Kerry Hough, PA-C   1,000 mg at 09/05/12 4098    Lab Results: No  results found for this or any previous visit (from the past 48 hour(s)).  Physical Findings: AIMS: Facial and Oral Movements Muscles of Facial Expression: None, normal Lips and Perioral Area: None, normal Jaw: None, normal Tongue: None, normal,Extremity Movements Upper (arms, wrists, hands, fingers): None, normal Lower (legs, knees, ankles, toes): None, normal, Trunk Movements Neck, shoulders, hips: None, normal, Overall Severity Severity of abnormal movements (highest score from questions above): None, normal Incapacitation due to abnormal movements: None, normal Patient's awareness of abnormal movements (rate only patient's report): No Awareness, Dental Status Current problems with teeth and/or dentures?: No Does patient  usually wear dentures?: Yes  CIWA:  CIWA-Ar Total: 8 COWS:     Treatment Plan Summary: Daily contact with patient to assess and evaluate symptoms and progress in treatment Medication management  Plan: Supportive approach/coping skills/relapse prevention           Optimize treatment with the Abilify increase the dose to 15 mg daily  Medical Decision Making Problem Points:  Review of psycho-social stressors (1) Data Points:  Review of medication regiment & side effects (2)  I certify that inpatient services furnished can reasonably be expected to improve the patient's condition.   Alyssha Housh A 09/05/2012, 3:14 PM

## 2012-09-05 NOTE — Progress Notes (Signed)
Patient ID: [name], male   DOB: [birth year], 52 y.o.   MRN: [id number] D. The patient is bright and social, interacting in the milieu. His speech is loud and pressured and he is getting somewhat intrusive and hyper religious.  A. Met with patient and encouraged to attend AA/NA meeting on 300 hall. Support given. Some gentle redirection needed. R. The patient attended group on 500 hall when AA speaker did not come tonight. Compliant with medication.

## 2012-09-05 NOTE — [facility] Group Notes (Signed)
[facility] LCSW Group Therapy  09/05/2012 3:15-4:10pm  Summary of Progress/Problems:  The main focus of today's process group was to define "support" and describe what healthy supports are.  We then discussed how and why to increase patient supports, using motivational interviewing.  An activity was done to demonstrate strength in numbers greater than 1, and we discussed that supports can hold [place] accountable to our wellness goals.  Several patients were resistant to this idea and there was a lively discussion.   The patient expressed little during group, kept his eyes closed but appeared to be attentive.  Type of Therapy:  Group Therapy  Participation Level:  Active  Participation Quality:  Attentive  Affect:  Blunted and Depressed  Cognitive:  Appropriate  Insight:  Developing/Improving  Engagement in Therapy:  Engaged  Modes of Intervention:  Discussion and Exploration   Sarina Ser 09/05/2012, 5:06 PM

## 2012-09-06 DIAGNOSIS — F1994 Other psychoactive substance use, unspecified with psychoactive substance-induced mood disorder: Secondary | ICD-10-CM

## 2012-09-06 DIAGNOSIS — F1910 Other psychoactive substance abuse, uncomplicated: Secondary | ICD-10-CM

## 2012-09-06 NOTE — Progress Notes (Signed)
Adult Psychoeducational Group Note  Date:  09/06/2012 Time:  9:56 PM  Group Topic/Focus:  Identifying Needs:   The focus of this group is to help patients identify their personal needs that have been historically problematic and identify healthy behaviors to address their needs.  Participation Level:  Active  Participation Quality:  Appropriate  Affect:  Appropriate  Cognitive:  Alert, Appropriate and Oriented  Insight: Appropriate and Good  Engagement in Group:  Engaged and Supportive  Modes of Intervention:  Problem-solving  Additional Comments:  [name] was able to identify the need to change in his life by comparing it to the Bible.   [name] Placedo 09/06/2012, 9:56 PM

## 2012-09-06 NOTE — Progress Notes (Signed)
Recreation Therapy Notes  Date: 04.14.2014  Time: 3:00pm Location: [facility]ty[facility]     Group Topic/Focus: Self Expression  Participation Level: Active  Participation Quality: Appropriate  Affect: Euthymic  Cognitive: Appropriate   Additional Comments: Activity: Who Am I worksheet & The Pieces of Me worksheet. Explanation: Patients were given a Who Am I worksheet. Worksheet had a circle divided into 6 equal parts. Patients were asked to identify the characteristics that are most prominent within them. Patients were then given The Pieces of Me worksheet. Worksheet has a circle divided into 10 equal parts. Patient were asked to define themselves using the following categories: Mind, Body, Spirituality, Alone, At Home, With Family, With Friends, At Work, At [employer]. Once part was left open for patients to define their own category.   Patient actively participated on group activity. Patient offered advice and suggestions to peers. Patient participated in group discussion about forgiveness and being wholly well.    Marykay Lex Mason Dibiasio, LRT/CTRS   Jearl Klinefelter 09/06/2012 4:15 PM

## 2012-09-06 NOTE — Progress Notes (Signed)
Patient ID: [name], male   DOB: 01-08-1961, 52 y.o.   MRN: [id number] D-Patient says he slept with medication and his appetite is improving.  He reports his energy level is "hyper" He rates his depression a 10 and hopelessness a 10.  He is taking keflex for a UTI and denies any symptoms.  He is hearing voices and hopes that getting back on his meds will minimize them.  His speech seems pressured.  A- Supported patient.  R- he is attending groups and interacting with peers and staff.

## 2012-09-06 NOTE — Progress Notes (Signed)
[facility] MD Progress Note  09/06/2012 1:07 PM [name]  MRN:  [id number] Subjective:  Hyperverbal, 3/10 depression, suicidal ideations constant, sleep improving, appetite fair.  Patient feels bad about relapse--encouragement given.  He had been clean for two years but his family "set him off" and he stopped taking his medications and started drinking, mood became unstability. Diagnosis:   Axis I: Anxiety Disorder NOS, Bipolar, Manic, Substance Abuse and Substance Induced Mood Disorder Axis II: Deferred Axis III:  Past Medical History  Diagnosis Date  . Depression   . Arthritis   . Bipolar 1 disorder   . PTSD (post-traumatic stress disorder)   . Schizophrenia    Axis IV: other psychosocial or environmental problems, problems related to social environment and problems with primary support group Axis V: 41-50 serious symptoms  ADL's:  Intact  Sleep: Fair  Appetite:  Fair  Suicidal Ideation:  Plan:  None Intent:  None Means:  None Homicidal Ideation:  None  Psychiatric Specialty Exam: Review of Systems  Constitutional: Negative.   HENT: Negative.   Eyes: Negative.   Respiratory: Negative.   Cardiovascular: Negative.   Gastrointestinal: Negative.   Genitourinary: Negative.   Musculoskeletal: Negative.   Skin: Negative.   Neurological: Negative.   Endo/Heme/Allergies: Negative.   Psychiatric/Behavioral: Positive for depression and substance abuse. The patient is nervous/anxious.     Blood pressure 113/74, pulse 96, temperature 98.5 F (36.9 C), temperature source Oral, resp. rate 18, height 6\' 1"  (1.854 m), weight 105.688 kg (233 lb).Body mass index is 30.75 kg/(m^2).  General Appearance: Casual  Eye Contact::  Fair  Speech:  Hyperverbal  Volume:  Normal  Mood:  Euphoric and Irritable  Affect:  Congruent  Thought Process:  Coherent  Orientation:  Full (Time, Place, and Person)  Thought Content:  WDL  Suicidal Thoughts:  Yes.  without intent/plan  Homicidal  Thoughts:  No  Memory:  Immediate;   Fair Recent;   Fair Remote;   Fair  Judgement:  Poor  Insight:  Fair  Psychomotor Activity:  Increased  Concentration:  Fair  Recall:  Fair  Akathisia:  No  Handed:  Right  AIMS (if indicated):     Assets:  Communication Skills Desire for Improvement Resilience  Sleep:  Number of Hours: 6   Current Medications: Current Facility-Administered Medications  Medication Dose Route Frequency Provider Last Rate Last Dose  . acetaminophen (TYLENOL) tablet 650 mg  650 mg Oral Q6H PRN Kerry Hough, PA-C   650 mg at 09/02/12 0805  . alum & mag hydroxide-simeth (MAALOX/MYLANTA) 200-200-20 MG/5ML suspension 30 mL  30 mL Oral Q4H PRN Kerry Hough, PA-C      . ARIPiprazole (ABILIFY) tablet 5 mg  5 mg Oral BID Rachael Fee, MD   5 mg at 09/06/12 0758  . cephALEXin (KEFLEX) capsule 500 mg  500 mg Oral TID Sanjuana Kava, NP   500 mg at 09/06/12 1155  . hydrOXYzine (ATARAX/VISTARIL) tablet 50 mg  50 mg Oral QHS PRN Rachael Fee, MD   50 mg at 09/05/12 2202  . ibuprofen (ADVIL,MOTRIN) tablet 600 mg  600 mg Oral Q6H PRN Shuvon Rankin, NP   600 mg at 09/06/12 0800  . magnesium hydroxide (MILK OF MAGNESIA) suspension 30 mL  30 mL Oral Daily PRN Kerry Hough, PA-C      . mirtazapine (REMERON SOL-TAB) disintegrating tablet 15 mg  15 mg Oral QHS Rachael Fee, MD   15 mg at 09/05/12 2202  .  valACYclovir (VALTREX) tablet 1,000 mg  1,000 mg Oral BID Kerry Hough, PA-C   1,000 mg at 09/06/12 1610    Lab Results: No results found for this or any previous visit (from the past 48 hour(s)).  Physical Findings: AIMS: Facial and Oral Movements Muscles of Facial Expression: None, normal Lips and Perioral Area: None, normal Jaw: None, normal Tongue: None, normal,Extremity Movements Upper (arms, wrists, hands, fingers): None, normal Lower (legs, knees, ankles, toes): None, normal, Trunk Movements Neck, shoulders, hips: None, normal, Overall Severity Severity of  abnormal movements (highest score from questions above): None, normal Incapacitation due to abnormal movements: None, normal Patient's awareness of abnormal movements (rate only patient's report): No Awareness, Dental Status Current problems with teeth and/or dentures?: No Does patient usually wear dentures?: Yes  CIWA:  CIWA-Ar Total: 8 COWS:     Treatment Plan Summary: Daily contact with patient to assess and evaluate symptoms and progress in treatment Medication management  Plan:  Review of chart, vital signs, medications, and notes. 1-Individual and group therapy 2-Medication management for depression, mood stability, and anxiety:  Medications reviewed with the patient and he stated no untoward effects 3-Coping skills for depression and anxiety  4-Continue crisis stabilization and management 5-Address health issues--monitoring vital signs, stable 6-Treatment plan in progress to prevent relapse of depression, mood instability, and anxiety  Medical Decision Making Problem Points:  Established problem, stable/improving (1) and Review of psycho-social stressors (1) Data Points:  Review of medication regiment & side effects (2)  I certify that inpatient services furnished can reasonably be expected to improve the patient's condition.   Nanine Means, PMH-NP 09/06/2012, 1:07 PM

## 2012-09-06 NOTE — Progress Notes (Signed)
The patient attended the A.A. Meeting on the 300 hallway this evening.  

## 2012-09-06 NOTE — Progress Notes (Signed)
Adult Psychoeducational Group Note  Date:  09/06/2012 Time:  1:56 PM  Group Topic/Focus:  Wellness Toolbox:   The focus of this group is to discuss various aspects of wellness, balancing those aspects and exploring ways to increase the ability to experience wellness.  Patients will create a wellness toolbox for use upon discharge.  Participation Level:  Active  Participation Quality:  Appropriate, Attentive and Sharing  Affect:  Appropriate  Cognitive:  Alert and Appropriate  Insight: Appropriate  Engagement in Group:  Engaged  Modes of Intervention:  Discussion  Additional Comments:  Pt was appropriate and sharing while attending group. Pt stated that his spiritual goal was staying with Jesus. He also stated that his emotional goal was to stay way from negative people.   Sharyn Lull 09/06/2012, 1:56 PM

## 2012-09-06 NOTE — Tx Team (Signed)
Interdisciplinary Treatment Plan Update   Date Reviewed:  09/06/2012  Time Reviewed:  9:53 AM  Progress in Treatment:   Attending groups: Yes Participating in groups: Yes Taking medication as prescribed: Yes  Tolerating medication: Yes Family/Significant other contact made:No contact made with family at this time.  Patient understands diagnosis: Yes  Discussing patient identified problems/goals with staff: Yes Medical problems stabilized or resolved: Yes Denies suicidal/homicidal ideation: No, but able to contract for safety Patient has not harmed self or others: Yes  For review of initial/current patient goals, please see plan of care.  Estimated Length of Stay:  2-3 days  Reasons for Continued Hospitalization:  Anxiety Depression Medication stabilization Suicidal ideation  New Problems/Goals identified:    Discharge Plan or Barriers:   Home with outpatient follow up  Additional Comments:  Patient continues to endorse off/on SI at this time but able to contract for safety.  He is rating all symptoms at five.  Attendees:  Patient:  09/06/2012 9:53 AM   Signature:   09/06/2012 9:53 AM  Signature: 09/06/2012 9:53 AM  Signature:  09/06/2012 9:53 AM  Signature: 09/06/2012 9:53 AM  Signature:  Neill Loft RN 09/06/2012 9:53 AM  Signature:  Juline Patch, LCSW 09/06/2012 9:53 AM  Signature: Silverio Decamp, PMH-NP 09/06/2012 9:53 AM  Signature:  09/06/2012 9:53 AM  Signature:  09/06/2012 9:53 AM  Signature:    Signature:    Signature:      Scribe for Treatment Team:   Juline Patch,  09/06/2012 9:53 AM

## 2012-09-06 NOTE — Progress Notes (Signed)
Patient ID: [name], male   DOB: 1960-09-09, 52 y.o.   MRN: [id number] D. The patient is bright and social. Interacting in the milieu. His speech is getting louder and more rapid. Somewhat grandiose and flavored with religiosity. Attended and enjoyed AA/NA group.  A. Met with patient. Verbal support given. Encouraged to go to evening group. Administered HS medication. R. Attended and participated in evening group.  Stated he felt very inspired by the speakers.

## 2012-09-06 NOTE — Progress Notes (Signed)
D.  Pt. Has flat affect, depressed mood.  Denies SI/HI and denies A/V hallucinations and contracts for safety.  Reports that he stopped his medications and relapsed and that his family members stress him.  Pt.  Interacting appropriately with peers and staff. A.  Encouragement and support given. R.  Pt.  Receptive.

## 2012-09-07 MED ORDER — ARIPIPRAZOLE 10 MG PO TABS
10.0000 mg | ORAL_TABLET | Freq: Two times a day (BID) | ORAL | Status: DC
  Administered 2012-09-08 – 2012-09-09 (×3): 10 mg via ORAL
  Filled 2012-09-07 (×7): qty 1

## 2012-09-07 MED ORDER — ARIPIPRAZOLE ER 400 MG IM SUSR: 400.0000 mg | Freq: Once | INTRAMUSCULAR | Status: DC

## 2012-09-07 NOTE — Progress Notes (Signed)
Patient ID: [name], male   DOB: 10/05/1960, 52 y.o.   MRN: [id number] D- Patient reports he slept with medication.  He says his appetite is improving and his ability to pay attention is improving.Marland Kitchen He reports thoughts of self harm off  and on.  Asked pt to contract for safety .  R- Patient contracts.  He also says he is continuing to hear voices but they are less frequent.  He does feel medication is helping.  His speech is less pressured today.  Pt is polite and cooperative.

## 2012-09-07 NOTE — [facility] Group Notes (Signed)
[facility] LCSW Group Therapy      Feelings About Diagnosis 1:15 - 2:30 PM          09/07/2012 12:38 PM  Type of Therapy:  Group Therapy  Participation Level:  Active  Participation Quality:  Appropriate and Attentive  Affect:  Appropriate and Depressed  Cognitive:  Alert and Appropriate  Insight:  Engaged  Engagement in Therapy:  Engaged  Modes of Intervention: Discussion, Education, Exploration, Problem-Solving, Rapport, Support  Summary of Progress/Problems:  Patient advised that he has not taken care of himself as he should over the years.  He stated he has resisted treatment after leaving the hospital but has to humble himself and come back into the hospital again and again.  Wynn Banker 09/07/2012, 12:38 PM

## 2012-09-07 NOTE — Progress Notes (Signed)
Adult Psychoeducational Group Note  Date:  09/07/2012 Time:  6:21 PM  Group Topic/Focus:  Recovery Goals:   The focus of this group is to identify appropriate goals for recovery and establish a plan to achieve them.  Participation Level:  Active  Participation Quality:  Appropriate, Attentive and Sharing  Affect:  Appropriate  Cognitive:  Alert and Appropriate  Insight: Appropriate  Engagement in Group:  Engaged  Modes of Intervention:  Discussion  Additional Comments:  Pt was appropriate and sharing. Pt stated that he feels that his willingness is what is standing between her and recovery.   Sharyn Lull 09/07/2012, 6:21 PM

## 2012-09-07 NOTE — Progress Notes (Signed)
Patient ID: [name], male   DOB: 08-Mar-1961, 52 y.o.   MRN: [id number] D: Patient lying in bed with eyes closed. Respirations even and non-labored.  A: Staff will monitor on q 15 minute checks, follow treatment plan, and give meds as ordered. R: Appears to be sleeping at this time.

## 2012-09-07 NOTE — Progress Notes (Signed)
Date: 09/07/2012  Time: 9:15 PM  Group Topic/Focus:  Wrap-Up Group: The focus of this group is to help patients review their daily goal of treatment and discuss progress on daily workbooks.  Participation Level: Active  Participation Quality: Appropriate, Sharing and Supportive  Affect: Appropriate  Cognitive: Appropriate  Insight: Appropriate  Engagement in Group: Engaged and Supportive  Modes of Intervention: Education, Problem-solving and Support  Additional Comments: none  [name] M  09/07/2012, 9:15 PM  

## 2012-09-07 NOTE — [facility] Group Notes (Signed)
[facility] LCSW Aftercare Discharge Planning Group Note   09/07/2012 12:10 PM  Participation Quality:  Appropriate  Mood/Affect:  Appropriate and Depressed  Depression Rating:  5  Anxiety Rating:  5  Thoughts of Suicide:  Yes  Will you contract for safety?   Yes  Current AVH:  No  Plan for Discharge/Comments:  Patient continues to endorses off/on SI but contracts for safety.  He shared he has contacted [facility] and expects to be able to discharge to their program.  He asked that writer would call to provide information as needed.  Transportation Means: Patient will need assistance with transportation.  Supports: Patient has limited support   [name], [employer]

## 2012-09-07 NOTE — Progress Notes (Signed)
D: patient appeared sad and blunted at the beginning of the shift. He reported that he had a bad day; but felt better after reading his bible. He endorsed on and off SI; verbally contracted for safety , denied a/v hallucinations. A: writer talked to patient about his IM medication that he refused per report from day nurse; encouraged patient about the advantage of taking medications including IM as prescribed. R: Patient stated; "I just hate needle". He received his HS medications as ordered without difficulty. Q 15 minute check continues as ordered to maintain safety.

## 2012-09-07 NOTE — Progress Notes (Signed)
[name] MD Progress Note  09/07/2012 6:53 PM [name]  MRN:  [id number] Subjective:  Will not take the [name]. Says he has phobia to the needles.  He claims he is going to get out of town. Does not want to stay in the area Diagnosis:  Bipolar Disorder, Cocaine, alcohol abuse  ADL's:  Intact  Sleep: Fair  Appetite:  Fair  Suicidal Ideation:  Plan:  denies Intent:  denies Means:  denies Homicidal Ideation:  Plan:  denies Intent:  denies Means:  denies AEB (as evidenced by):  Psychiatric Specialty Exam: Review of Systems  Constitutional: Negative.   HENT: Negative.   Eyes: Negative.   Respiratory: Negative.   Cardiovascular: Negative.   Gastrointestinal: Negative.   Genitourinary: Negative.   Musculoskeletal: Negative.   Skin: Negative.   Neurological: Negative.   Endo/Heme/Allergies: Negative.   Psychiatric/Behavioral: Positive for substance abuse. The patient is nervous/anxious.     Blood pressure 112/68, pulse 97, temperature 98.2 F (36.8 C), temperature source Oral, resp. rate 16, height 6\' 1"  (1.854 m), weight 105.688 kg (233 lb).Body mass index is 30.75 kg/(m^2).  General Appearance: Fairly Groomed  Patent attorney::  Fair  Speech:  Clear and Coherent and rapid  Volume:  fluctuates  Mood:  Anxious and Irritable  Affect:  Restricted  Thought Process:  Coherent and Goal Directed  Orientation:  Full (Time, Place, and Person)  Thought Content:  does not want the injection  Suicidal Thoughts:  No  Homicidal Thoughts:  No  Memory:  Immediate;   Fair Recent;   Fair Remote;   Fair  Judgement:  Poor  Insight:  Shallow  Psychomotor Activity:  Restlessness  Concentration:  Fair  Recall:  Fair  Akathisia:  No  Handed:  Right  AIMS (if indicated):     Assets:  Desire for Improvement  Sleep:  Number of Hours: 6.25   Current Medications: Current Facility-Administered Medications  Medication Dose Route Frequency Provider Last Rate Last Dose  . acetaminophen  (TYLENOL) tablet 650 mg  650 mg Oral Q6H PRN Kerry Hough, PA-C   650 mg at 09/02/12 0805  . alum & mag hydroxide-simeth (MAALOX/MYLANTA) 200-200-20 MG/5ML suspension 30 mL  30 mL Oral Q4H PRN Kerry Hough, PA-C      . ARIPiprazole (ABILIFY) tablet 5 mg  5 mg Oral BID Rachael Fee, MD   5 mg at 09/07/12 1849  . ARIPiprazole SUSR 400 mg  400 mg Intramuscular Once Rachael Fee, MD      . cephALEXin [facility]) capsule 500 mg  500 mg Oral TID Sanjuana Kava, NP   500 mg at 09/07/12 1600  . hydrOXYzine (ATARAX/VISTARIL) tablet 50 mg  50 mg Oral QHS PRN Rachael Fee, MD   50 mg at 09/06/12 2226  . ibuprofen (ADVIL,MOTRIN) tablet 600 mg  600 mg Oral Q6H PRN Shuvon Rankin, NP   600 mg at 09/07/12 0757  . magnesium hydroxide (MILK OF MAGNESIA) suspension 30 mL  30 mL Oral Daily PRN Kerry Hough, PA-C      . mirtazapine (REMERON SOL-TAB) disintegrating tablet 15 mg  15 mg Oral QHS Rachael Fee, MD   15 mg at 09/06/12 2225  . valACYclovir (VALTREX) tablet 1,000 mg  1,000 mg Oral BID Kerry Hough, PA-C   1,000 mg at 09/07/12 1600    Lab Results: No results found for this or any previous visit (from the past 48 hour(s)).  Physical Findings: AIMS: Facial and Oral Movements  Muscles of Facial Expression: None, normal Lips and Perioral Area: None, normal Jaw: None, normal Tongue: None, normal,Extremity Movements Upper (arms, wrists, hands, fingers): None, normal Lower (legs, knees, ankles, toes): None, normal, Trunk Movements Neck, shoulders, hips: None, normal, Overall Severity Severity of abnormal movements (highest score from questions above): None, normal Incapacitation due to abnormal movements: None, normal Patient's awareness of abnormal movements (rate only patient's report): No Awareness, Dental Status Current problems with teeth and/or dentures?: No Does patient usually wear dentures?: Yes  CIWA:  CIWA-Ar Total: 8 COWS:     Treatment Plan Summary: Daily contact with patient to  assess and evaluate symptoms and progress in treatment Medication management  Plan: Supportive approach/coping skills/relapse prevention           Refused the [place]           Will increase the Abilify  Medical Decision Making Problem Points:  Review of psycho-social stressors (1) Data Points:  Review of medication regiment & side effects (2) Review of new medications or change in dosage (2)  I certify that inpatient services furnished can reasonably be expected to improve the patient's condition.   [name] A 09/07/2012, 6:53 PM

## 2012-09-08 DIAGNOSIS — F3110 Bipolar disorder, current episode manic without psychotic features, unspecified: Secondary | ICD-10-CM

## 2012-09-08 MED ORDER — ARIPIPRAZOLE ER 400 MG IM SUSR: 400.0000 mg | Freq: Once | INTRAMUSCULAR | Status: DC

## 2012-09-08 MED ORDER — HYDROXYZINE HCL 25 MG PO TABS
25.0000 mg | ORAL_TABLET | Freq: Four times a day (QID) | ORAL | Status: DC | PRN
  Administered 2012-09-08: 25 mg via ORAL

## 2012-09-08 MED ORDER — LIDOCAINE-PRILOCAINE 2.5-2.5 % EX CREA
TOPICAL_CREAM | Freq: Once | CUTANEOUS | Status: DC
  Filled 2012-09-08: qty 5

## 2012-09-08 MED ORDER — CARBAMAZEPINE ER 200 MG PO CP12
200.0000 mg | ORAL_CAPSULE | Freq: Two times a day (BID) | ORAL | Status: DC
  Administered 2012-09-08 – 2012-09-13 (×10): 200 mg via ORAL
  Filled 2012-09-08 (×14): qty 1

## 2012-09-08 NOTE — Progress Notes (Signed)
D:  Patient states that he had a bad day yesterday and continues to be depressed and hopeless.  He is rating depression and hopelessness at 10/10.  He states that he continues to have racing thoughts and thoughts of suicide, but he does contract for safety.  He is attending groups and is interacting with others.  He states that he is not going to take the [place] shot at this time even after much encouragement.  He states that "I have the right to refuse".  I recommended that he consider how much good it would do in the long run.  He stated that he would consider it. A:  Medications administered as ordered.  Patient continues to refuse Maintena.  Emotional support provided.  Safety checks q 15 minutes. R:  Safety maintained on unit.

## 2012-09-08 NOTE — Progress Notes (Signed)
[facility] MD Progress Note  09/08/2012 5:30 PM [name]  MRN:  [id number] Subjective:  [name] is refusing the IM medications claiming phobia to needles. He is acutely manic. He is tolerating his po Abilify well. Dose was increased today. Will reassess for further increase in the AM.  Diagnosis:  Bipolar Disorder Manic/Cocaine Dependence  ADL's:  Intact  Sleep: Fair  Appetite:  Fair  Suicidal Ideation:  Plan:  denies Intent:  denies Means:  denies Homicidal Ideation:  Plan:  denies Intent:  denies Means:  denies AEB (as evidenced by):  Psychiatric Specialty Exam: Review of Systems  Constitutional: Negative.   HENT: Negative.   Eyes: Negative.   Respiratory: Negative.   Cardiovascular: Negative.   Gastrointestinal: Negative.   Genitourinary: Negative.   Musculoskeletal: Negative.   Skin: Negative.   Neurological: Negative.   Endo/Heme/Allergies: Negative.   Psychiatric/Behavioral: Positive for substance abuse. The patient is nervous/anxious and has insomnia.        Manic    Blood pressure 112/69, pulse 92, temperature 98.2 F (36.8 C), temperature source Oral, resp. rate 16, height 6\' 1"  (1.854 m), weight 105.688 kg (233 lb).Body mass index is 30.75 kg/(m^2).  General Appearance: Fairly Groomed  Patent attorney::  Fair  Speech:  Clear and Coherent and Pressured  Volume:  fluctuates  Mood:  Anxious and Euphoric  Affect:  anxious  Thought Process:  Coherent and Goal Directed  Orientation:  Full (Time, Place, and Person)  Thought Content:  Rumination  Suicidal Thoughts:  No  Homicidal Thoughts:  No  Memory:  Immediate;   Fair Recent;   Fair Remote;   Fair  Judgement:  Fair  Insight:  Present  Psychomotor Activity:  Increased  Concentration:  Fair  Recall:  Fair  Akathisia:  No  Handed:  Right  AIMS (if indicated):     Assets:  Desire for Improvement  Sleep:  Number of Hours: 4.5   Current Medications: Current Facility-Administered Medications  Medication Dose  Route Frequency Provider Last Rate Last Dose  . acetaminophen (TYLENOL) tablet 650 mg  650 mg Oral Q6H PRN Kerry Hough, PA-C   650 mg at 09/02/12 0805  . alum & mag hydroxide-simeth (MAALOX/MYLANTA) 200-200-20 MG/5ML suspension 30 mL  30 mL Oral Q4H PRN Kerry Hough, PA-C      . ARIPiprazole (ABILIFY) tablet 10 mg  10 mg Oral BID Rachael Fee, MD   10 mg at 09/08/12 1702  . ARIPiprazole SUSR 400 mg  400 mg Intramuscular Once Rachael Fee, MD      . carbamazepine (Antipsychotic - EQUETRO) 12 hr capsule 200 mg  200 mg Oral BID Rachael Fee, MD   200 mg at 09/08/12 1700  . cephALEXin (KEFLEX) capsule 500 mg  500 mg Oral TID Sanjuana Kava, NP   500 mg at 09/08/12 1700  . hydrOXYzine (ATARAX/VISTARIL) tablet 25 mg  25 mg Oral Q6H PRN Rachael Fee, MD   25 mg at 09/08/12 1546  . hydrOXYzine (ATARAX/VISTARIL) tablet 50 mg  50 mg Oral QHS PRN Rachael Fee, MD   50 mg at 09/07/12 2248  . ibuprofen (ADVIL,MOTRIN) tablet 600 mg  600 mg Oral Q6H PRN Shuvon Rankin, NP   600 mg at 09/08/12 0809  . lidocaine-prilocaine (EMLA) cream   Topical Once Rachael Fee, MD      . magnesium hydroxide (MILK OF MAGNESIA) suspension 30 mL  30 mL Oral Daily PRN Kerry Hough, PA-C      .  mirtazapine (REMERON SOL-TAB) disintegrating tablet 15 mg  15 mg Oral QHS Rachael Fee, MD   15 mg at 09/07/12 2247  . valACYclovir (VALTREX) tablet 1,000 mg  1,000 mg Oral BID Kerry Hough, PA-C   1,000 mg at 09/08/12 1700    Lab Results: No results found for this or any previous visit (from the past 48 hour(s)).  Physical Findings: AIMS: Facial and Oral Movements Muscles of Facial Expression: None, normal Lips and Perioral Area: None, normal Jaw: None, normal Tongue: None, normal,Extremity Movements Upper (arms, wrists, hands, fingers): None, normal Lower (legs, knees, ankles, toes): None, normal, Trunk Movements Neck, shoulders, hips: None, normal, Overall Severity Severity of abnormal movements (highest score  from questions above): None, normal Incapacitation due to abnormal movements: None, normal Patient's awareness of abnormal movements (rate only patient's report): No Awareness, Dental Status Current problems with teeth and/or dentures?: No Does patient usually wear dentures?: Yes  CIWA:  CIWA-Ar Total: 8 COWS:     Treatment Plan Summary: Daily contact with patient to assess and evaluate symptoms and progress in treatment Medication management  Plan: Supportive approach/coping skills/relapse prevention           Optimize treatment with Abilify           Will add Equetro 200 mg BID  Medical Decision Making Problem Points:  Review of psycho-social stressors (1) Data Points:  Review of medication regiment & side effects (2) Review of new medications or change in dosage (2)  I certify that inpatient services furnished can reasonably be expected to improve the patient's condition.   Kingsly Kloepfer A 09/08/2012, 5:30 PM

## 2012-09-08 NOTE — Progress Notes (Signed)
Recreation Therapy Notes  Date: 04.16.2014  Time: 3:00pm Location: [address] Day Room   Group Topic/Focus: Decision Making   Participation Level:  Active   Participation Quality:  Appropriate, Sharing   Affect:  Flat   Cognitive:  Appropriate   Additional Comments: Activity:Would you Rather & If; Explanation: Would you Rather - Patients were individually asked an either/or question (i.e.: Would you rather eat a blue M&M or a red M&M). Patient's were asked to explain why they chose the option they did. If - Patients were individually asked a "If you could... " question. Patient answered the questions and explained why they chose the answer they did.   Patient actively participated in group activities. When asked "Would you rather be hairy all over or completely bald?" Patient stated he would rather be bald because he is vain and does not want grey hair. When asked "If you could go anywhere in the world, where would you go?" Patient stated he would go to [place] to walk with Jesus. Participated in group discussion about decision making and what influences the decisions we make. Patient was asked to leave session with physician at approximately 3:20pm, patient did not return to session.   Marykay Lex Mckinley Adelstein, LRT/CTRS   Jearl Klinefelter 09/08/2012 4:23 PM

## 2012-09-08 NOTE — [facility] Group Notes (Signed)
[facility] LCSW Group Therapy      Emotional Regulation 1:15 - 2:30 PM          09/08/2012 4:17 PM  Type of Therapy:  Group Therapy  Participation Level:  Minimal  Participation Quality:   Minimal  Affect:  Drowsy  Cognitive: Appropriate  Insight: Limited  Engagement in Therapy: Limited  Modes of Intervention: Discussion, Education, Exploration, Problem-Solving, Rapport, Support  Summary of Progress/Problems:  Patient was drowsy throughout most of group. Wynn Banker 09/08/2012, 4:17 PM

## 2012-09-08 NOTE — Progress Notes (Signed)
Adult Psychoeducational Group Note  Date:  09/08/2012 Time:  12:16 PM  Group Topic/Focus:  Stages of Change:   The focus of this group is to explain the stages of change and help patients identify changes they want to make upon discharge.  Participation Level:  Minimal  Participation Quality:  Drowsy  Affect:  Blunted  Cognitive:  Oriented  Insight: Good  Engagement in Group:  Limited  Modes of Intervention:  Activity, Discussion, Education and Support  Additional Comments:  Pt was quite during group and was drowsy.   Olander Friedl T 09/08/2012, 12:16 PM

## 2012-09-08 NOTE — Progress Notes (Signed)
D:Patient reported a better day than he had yesterday. He said he was less agitated today that he was yesterday. His mood and affect appropriate. Patient denied SI/HI and denied hallucinations. He seemed somewhat cooperative and less irritable today.  A:Writer encouraged and supported patient. R: Patient receptive to encouragement and support. Q 15 minute check continues as ordered to maintain safety.

## 2012-09-08 NOTE — [facility] Group Notes (Signed)
[facility] LCSW Aftercare Discharge Planning Group Note   09/08/2012 12:01 PM  Participation Quality:  Appropriate  Mood/Affect:  Appropriate  Depression Rating:    Anxiety Rating:    Thoughts of Suicide:  Yes  Will you contract for safety?   Yes  Current AVH:  No    Plan for Discharge/Comments:  Patient reports MD continues to make adjustments to medications.  He endorses off/on SI.  Patient excited to be accepted to [facility] at discharge.  Transportation Means: Patient to be assisted with transportation  Supports:  Limited support system.  Olivette Beckmann, Joesph July

## 2012-09-09 MED ORDER — ARIPIPRAZOLE 10 MG PO TABS
10.0000 mg | ORAL_TABLET | Freq: Three times a day (TID) | ORAL | Status: DC
  Administered 2012-09-09 – 2012-09-13 (×12): 10 mg via ORAL
  Filled 2012-09-09 (×3): qty 1
  Filled 2012-09-09: qty 42
  Filled 2012-09-09 (×2): qty 1
  Filled 2012-09-09: qty 42
  Filled 2012-09-09 (×2): qty 1
  Filled 2012-09-09: qty 42
  Filled 2012-09-09 (×8): qty 1

## 2012-09-09 NOTE — Progress Notes (Signed)
Adult Psychoeducational Group Note  Date:  09/09/2012 Time:  9:59 PM  Group Topic/Focus:  Wrap-Up Group:   Karaoke Group    Additional Comments:  Pt. Attended and participated in [place] group.  Ruta Hinds [facility] 09/09/2012, 9:59 PM

## 2012-09-09 NOTE — Progress Notes (Signed)
[facility] MD Progress Note  09/09/2012 4:21 PM [name]  MRN:  [id number] Subjective:  Trooper endorses that he is still feeling manic. He has had to isolate himself as he cant stop talking. He admits that he realizes that he is talking about religion, preaching and that he soul not be doing it. He did feel that the Tegretol got him to feel somewhat more contained for a little while. Did sleep better last night. But his moods fluctuates Diagnosis:  Bipolar I Disorder, Cocaine/alcohol dependence  ADL's:  Intact  Sleep: Fair  Appetite:  Fair  Suicidal Ideation:  Plan:  denies Intent:  denies Means:  denies Homicidal Ideation:  Plan:  denies Intent:  denies Means:  denies AEB (as evidenced by):  Psychiatric Specialty Exam: Review of Systems  Constitutional: Negative.   HENT: Negative.   Eyes: Negative.   Respiratory: Negative.   Cardiovascular: Negative.   Gastrointestinal: Negative.   Genitourinary: Negative.   Musculoskeletal: Negative.   Skin: Negative.   Psychiatric/Behavioral: Positive for substance abuse. The patient is nervous/anxious and has insomnia.        Hypomania    Blood pressure 127/74, pulse 96, temperature 98.1 F (36.7 C), temperature source Oral, resp. rate 16, height 6\' 1"  (1.854 m), weight 105.688 kg (233 lb).Body mass index is 30.75 kg/(m^2).  General Appearance: Fairly Groomed  Patent attorney::  Fair  Speech:  Clear and Coherent and Pressured  Volume:  Increased  Mood:  Anxious, Euphoric and agitated  Affect:  anxious, expansive  Thought Process:  Coherent and Goal Directed  Orientation:  Full (Time, Place, and Person)  Thought Content:  hyper religiosity  Suicidal Thoughts:  No  Homicidal Thoughts:  No  Memory:  Immediate;   Fair Recent;   Fair Remote;   Fair  Judgement:  Fair  Insight:  Shallow  Psychomotor Activity:  Increased and Restlessness  Concentration:  Fair  Recall:  Fair  Akathisia:  No  Handed:  Right  AIMS (if indicated):      Assets:  Desire for Improvement  Sleep:  Number of Hours: 5   Current Medications: Current Facility-Administered Medications  Medication Dose Route Frequency Provider Last Rate Last Dose  . acetaminophen (TYLENOL) tablet 650 mg  650 mg Oral Q6H PRN Kerry Hough, PA-C   650 mg at 09/02/12 0805  . alum & mag hydroxide-simeth (MAALOX/MYLANTA) 200-200-20 MG/5ML suspension 30 mL  30 mL Oral Q4H PRN Kerry Hough, PA-C      . ARIPiprazole (ABILIFY) tablet 10 mg  10 mg Oral TID Rachael Fee, MD      . ARIPiprazole SUSR 400 mg  400 mg Intramuscular Once Rachael Fee, MD      . carbamazepine (Antipsychotic - EQUETRO) 12 hr capsule 200 mg  200 mg Oral BID Rachael Fee, MD   200 mg at 09/09/12 0803  . cephALEXin (KEFLEX) capsule 500 mg  500 mg Oral TID Sanjuana Kava, NP   500 mg at 09/09/12 1220  . hydrOXYzine (ATARAX/VISTARIL) tablet 25 mg  25 mg Oral Q6H PRN Rachael Fee, MD   25 mg at 09/08/12 1546  . hydrOXYzine (ATARAX/VISTARIL) tablet 50 mg  50 mg Oral QHS PRN Rachael Fee, MD   50 mg at 09/08/12 2211  . ibuprofen (ADVIL,MOTRIN) tablet 600 mg  600 mg Oral Q6H PRN Shuvon Rankin, NP   600 mg at 09/09/12 0807  . lidocaine-prilocaine (EMLA) cream   Topical Once Rachael Fee, MD      .  magnesium hydroxide (MILK OF MAGNESIA) suspension 30 mL  30 mL Oral Daily PRN Kerry Hough, PA-C      . mirtazapine (REMERON SOL-TAB) disintegrating tablet 15 mg  15 mg Oral QHS Rachael Fee, MD   15 mg at 09/08/12 2211  . valACYclovir (VALTREX) tablet 1,000 mg  1,000 mg Oral BID Kerry Hough, PA-C   1,000 mg at 09/09/12 1610    Lab Results: No results found for this or any previous visit (from the past 48 hour(s)).  Physical Findings: AIMS: Facial and Oral Movements Muscles of Facial Expression: None, normal Lips and Perioral Area: None, normal Jaw: None, normal Tongue: None, normal,Extremity Movements Upper (arms, wrists, hands, fingers): None, normal Lower (legs, knees, ankles, toes): None,  normal, Trunk Movements Neck, shoulders, hips: None, normal, Overall Severity Severity of abnormal movements (highest score from questions above): None, normal Incapacitation due to abnormal movements: None, normal Patient's awareness of abnormal movements (rate only patient's report): No Awareness, Dental Status Current problems with teeth and/or dentures?: No Does patient usually wear dentures?: Yes  CIWA:  CIWA-Ar Total: 8 COWS:     Treatment Plan Summary: Daily contact with patient to assess and evaluate symptoms and progress in treatment Medication management  Plan: Supportive approach/coping skills/relapse prevention           Increase the Abilify to 10 mg TID           Pursue Tegretol 200 mg BID  Medical Decision Making Problem Points:  Review of last therapy session (1) and Review of psycho-social stressors (1) Data Points:  Review of medication regiment & side effects (2) Review of new medications or change in dosage (2)  I certify that inpatient services furnished can reasonably be expected to improve the patient's condition.   Kaelea Gathright A 09/09/2012, 4:21 PM

## 2012-09-09 NOTE — Progress Notes (Signed)
D: Patient cooperative with staff. Patient's affect/mood is blunted and anxious. Patient complained of right leg pain 10/10. He's attending and participating in group activities. Patient often has asked the writer throughout the day, when he is to take his next medications, even after being told at the time of a current medication pass. Declined self inventory sheet today.   A: Support and encouragement provided to patient. Administered scheduled medications per ordering MD. PRN Ibuprofen given for pain. Monitor Q15 minute checks for safety.  R: Patient receptive. Passive SI, but contracts for safety. Denies HI and AVH. Patient remains safe on the unit.

## 2012-09-09 NOTE — Clinical Social Work Note (Signed)
Writer spoke with Jeri Modena at [facility].  He confirmed patient can discharge to their program as long as he understands there are no phone calls for 90 days and visits from immediate family only after ten days.  Patient was advised and stated he is wiling to abide by the rules.

## 2012-09-09 NOTE — [facility] Group Notes (Signed)
[facility] LCSW Group Therapy              [facility] 1:15 - 2:30  09/09/2012 3:56 PM  Type of Therapy:  Group Therapy  Participation Level:  Did Not Attend  Wynn Banker 09/09/2012, 3:56 PM

## 2012-09-09 NOTE — B[facility] Group Notes (Signed)
[facility] LCSW Aftercare Discharge Planning Group Note   09/09/2012 1:18 PM  Participation Quality:  Appropriate  Mood/Affect:  Appropriate  Depression Rating:  5  Anxiety Rating:  5  Thoughts of Suicide:  Yes  Will you contract for safety?   Yes  Current AVH:  No    Plan for Discharge/Comments:  Patient continues to endorse being the same.  He stated MD continues to make adjustments to his medications.  Patient shared he hopes to be ready to discharge tomorrow or Saturday.  Transportation Means: Patient to be assisted with transportation  Supports:  Limited support system.  Staphany Ditton, Joesph July

## 2012-09-09 NOTE — Progress Notes (Signed)
Adult Psychoeducational Group Note  Date:  09/09/2012 Time:  6:58 PM  Group Topic/Focus:  Self Esteem Action Plan:   The focus of this group is to help patients create a plan to continue to build self-esteem after discharge.  Participation Level:  None  Participation Quality:  Inattentive  Affect:  Resistant  Cognitive:  Oriented  Insight: None  Engagement in Group:  None  Modes of Intervention:  Activity, Discussion, Education, Exploration, Socialization and Support  Additional Comments:  Vincenet attended group and did not share, patient stated he has "been through the program before he already knows what will help him."  Karleen Hampshire Brittini 09/09/2012, 6:58 PM

## 2012-09-10 DIAGNOSIS — F1020 Alcohol dependence, uncomplicated: Secondary | ICD-10-CM

## 2012-09-10 DIAGNOSIS — F1420 Cocaine dependence, uncomplicated: Secondary | ICD-10-CM

## 2012-09-10 DIAGNOSIS — F319 Bipolar disorder, unspecified: Secondary | ICD-10-CM

## 2012-09-10 NOTE — Progress Notes (Signed)
Patient ID: [name], male   DOB: 09/26/1960, 52 y.o.   MRN: [id number]  D: Patient pleasant on approach tonight. Reports mood improved but still reporting some passive SI during the day. Taking meds tonight as scheduled. No overt s/s of depression. A: Staff will monitor on q 15 minute checks, follow treatment plan, and give meds as ordered. R: Went back to interacting with peers.

## 2012-09-10 NOTE — B[facility] Group Notes (Signed)
[facility] LCSW Aftercare Discharge Planning Group Note   09/10/2012 9:45 AM  Participation Quality:  Active and supportive towards others in the group  Mood/Affect:  Bright and happy  Depression Rating:  4  Anxiety Rating:  4  Thoughts of Suicide:  No Will you contract for safety?   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 Patient has family in [place] and follows up at [facility]. He will eventually move to [place] to live.  Patient reports he suffers from PTSD and goes up and down. Reports he is getting his medications stabilized and then hopefully will leave by first of the week.  Transportation Means: Taxi/bus vouchers  Supports: Family, mother, church family  Nail, Catalina Gravel

## 2012-09-10 NOTE — Progress Notes (Signed)
D: Patient pleasant and cooperative with staff and peers. Patient's affect/mood is depressed, but brightens on approach. Patient complained of right leg pain 10/10. He reported on the self inventory sheet that he had to request medication for sleep, energy level is hyper and ability to pay attention is improving. Patient rated depression "10" and feelings of hopelessness "5". He's compliant with medication regimen and attending groups, as well as participating in the discussions.  A: Support and encouragement provided to patient. Scheduled medications administered per MD orders. PRN Ibuprofen given for pain. Maintain Q15 minute checks for safety.  R: Patient receptive. Passive SI, but contracts for safety. Denies HI and AVH. Patient remains safe.

## 2012-09-10 NOTE — Progress Notes (Signed)
Adult Psychoeducational Group Note  Date:  09/10/2012 Time:  6:40 PM  Group Topic/Focus:  Stages of Change:   The focus of this group is to explain the stages of change and help patients identify changes they want to make upon discharge.  Participation Level:  Minimal  Participation Quality:  Appropriate and Attentive  Affect:  Appropriate  Cognitive:  Alert  Insight: Appropriate and Good  Engagement in Group:  Engaged  Modes of Intervention:  Education  Additional Comments:  Pt came into group at the very end but did not participate in group discussion.   Dalia Heading 09/10/2012, 6:40 PM

## 2012-09-10 NOTE — Progress Notes (Signed)
D   Pt is pleasant on approach  He is compliant with treatment and interacts well with others  He attended karoke group and participated appropriately  He denise suicidal ideation at present A   Verbal support given  Medications administered and effectiveness monitored   Q 15 min checks R   Pt safe at present

## 2012-09-10 NOTE — [facility] Group Notes (Signed)
[facility] LCSW Group Therapy  09/10/2012 3:28 PM  Type of Therapy:  Group Therapy  Participation Level:  Active  Participation Quality:  Monopolizing, Sharing and Supportive  Affect:  Excited  Cognitive:  Alert and Appropriate  Insight:  Developing/Improving  Engagement in Therapy:  Monopolizing and Supportive  Modes of Intervention:  Discussion, Exploration and Support  Summary of Progress/Problems:  Today's group discussion surrounded theme around relapse and prevention.  The group discussed the 5 stages of change and each member talked about what changes they would like to make, the feeling behind making change and why people regress.   Mckinnley was very manic and spoke rapidly thoughout group. He was singing in and out of group but did not need redirection as he kept it low and participated when he liked what was being said. He was very hyper-religious and called himself a political advocate, but provided insight with his religion and power behind change. He shares he needs to change his relationships with his family as they regress and he struggles being present in the moment because he cannot meet his families expectations and needs. He shares change feels lonely and he struggles being alone as he does not comply with what he needs to do.  Sopheap was very positive in group and shared his feelings and was able to relate to other members and their problems. Davie shares he regresses because he does not want to be alone, but has come to realize he cannot be concerned with what his family thinks of his life.  Nail, Catalina Gravel 09/10/2012, 3:28 PM

## 2012-09-10 NOTE — [facility] Suicide Risk Assessment (Signed)
[facility] INPATIENT:  Family/Significant Other Suicide Prevention Education  Suicide Prevention Education: patient reports he has a great family but does not want them involved as they are some of the reason he has readmitted. He will update them as needed. Patient Refusal for Family/Significant Other Suicide Prevention Education: The patient [name] has refused to provide written consent for family/significant other to be provided Family/Significant Other Suicide Prevention Education during admission and/or prior to discharge.  Physician notified.  Nail, Catalina Gravel 09/10/2012, 3:42 PM

## 2012-09-10 NOTE — Progress Notes (Signed)
Adult Psychoeducational Group Note  Date:  09/10/2012 Time:  12:18 PM  Group Topic/Focus:  Labels group: the group consisted of discussion, activity and education on the power of labels. This group explored the idea of breaking out of labels and engages the patients to discuss the topic.  Participation Level:  Active  Participation Quality:  Drowsy  Affect:  Appropriate  Cognitive:  Oriented  Insight: Good  Engagement in Group:  Engaged  Modes of Intervention:  Activity, Discussion and Education  Additional Comments:  PT said his goal was to go to groups and take his medication. Pt is looking forward to discharge.  Gaurav Baldree T 09/10/2012, 12:18 PM

## 2012-09-10 NOTE — Progress Notes (Signed)
The patient attended the A. A. Meeting on the 300 hallway. 

## 2012-09-10 NOTE — Tx Team (Signed)
Interdisciplinary Treatment Plan Update (Adult)  Date: 09/13/2012  Time Reviewed: 10:45 AM   Progress in Treatment: Attending groups: Yes Participating in groups: Yes Taking medication as prescribed:  Yes Tolerating medication:  Yes Family/Significant othe contact made: No Patient understands diagnosis: Yes Discussing patient identified problems/goals with staff: Yes Medical problems stabilized or resolved:  Yes Denies suicidal/homicidal ideation: Yes Patient has not harmed self or Others: Yes  New problem(s) identified: None Identified  Discharge Plan or Barriers:  Patient to follow up at [facility]  Additional comments: N/A  Reason for Continuation of Hospitalization: NA  Estimated length of stay: Discharge today  For review of initial/current patient goals, please see plan of care.  Attendees: Patient:     Family:     Physician:  Geoffery Lyons 09/13/2012 10:45 AM   Nursing:  Roswell Miners, RN 09/13/2012 10:45 AM   Clinical Social Worker Ronda Fairly 09/13/2012 10:45 AM   Other:  [facility],  Student 09/13/2012 10:45 AM   Other:  Serena Colonel, PA 09/13/2012 10:45 AM   Other:   09/13/2012 10:45 AM   Other:   09/13/2012 10:45 AM    Scribe for Treatment Team:   Carney Bern, LCSWA  09/13/2012 10:45 AM

## 2012-09-10 NOTE — Progress Notes (Signed)
Patient ID: [name], male   DOB: 01-Apr-1961, 52 y.o.   MRN: [id number] [facility] MD Progress Note  09/10/2012 2:30 PM Solon Alban  MRN:  [id number]  Subjective:  [name] endorses that he is still feeling some manic episodes today, but not as yesterday. He believed that he is slowing down some. States that Tegretol is helping him some. still waiting on Abilify to kick in. Says the social worker is trying to find him a treatment program that will hel him deal with his substance abuse issues.  Diagnosis:  Bipolar I Disorder, Cocaine/alcohol dependence  ADL's:  Intact  Sleep: Fair  Appetite:  Fair  Suicidal Ideation:  Plan:  denies Intent:  denies Means:  denies Homicidal Ideation:  Plan:  denies Intent:  denies Means:  denies  AEB (as evidenced by): Per patient's reports.  Psychiatric Specialty Exam: Review of Systems  Constitutional: Negative.   HENT: Negative.   Eyes: Negative.   Respiratory: Negative.   Cardiovascular: Negative.   Gastrointestinal: Negative.   Genitourinary: Negative.   Musculoskeletal: Negative.   Skin: Negative.   Psychiatric/Behavioral: Positive for substance abuse. The patient is nervous/anxious and has insomnia.        Hypomania    Blood pressure 102/71, pulse 91, temperature 97.6 F (36.4 C), temperature source Oral, resp. rate 18, height 6\' 1"  (1.854 m), weight 105.688 kg (233 lb).Body mass index is 30.75 kg/(m^2).  General Appearance: Fairly Groomed  Patent attorney::  Good  Speech:  Clear and Coherent and Pressured  Volume:  Increased  Mood:  Anxious, Euphoric and agitated  Affect:  anxious, expansive  Thought Process:  Coherent and Goal Directed  Orientation:  Full (Time, Place, and Person)  Thought Content:  hyper religiosity, [name] mania  Suicidal Thoughts:  No  Homicidal Thoughts:  No  Memory:  Immediate;   Fair Recent;   Fair Remote;   Fair  Judgement:  Fair  Insight:  Shallow  Psychomotor Activity:  Increased and Restlessness   Concentration:  Fair  Recall:  Fair  Akathisia:  No  Handed:  Right  AIMS (if indicated):     Assets:  Desire for Improvement  Sleep:  Number of Hours: 6   Current Medications: Current Facility-Administered Medications  Medication Dose Route Frequency Provider Last Rate Last Dose  . acetaminophen (TYLENOL) tablet 650 mg  650 mg Oral Q6H PRN Kerry Hough, PA-C   650 mg at 09/02/12 0805  . alum & mag hydroxide-simeth (MAALOX/MYLANTA) 200-200-20 MG/5ML suspension 30 mL  30 mL Oral Q4H PRN Kerry Hough, PA-C      . ARIPiprazole (ABILIFY) tablet 10 mg  10 mg Oral TID Rachael Fee, MD   10 mg at 09/10/12 1216  . ARIPiprazole SUSR 400 mg  400 mg Intramuscular Once Rachael Fee, MD      . carbamazepine (Antipsychotic - EQUETRO) 12 hr capsule 200 mg  200 mg Oral BID Rachael Fee, MD   200 mg at 09/10/12 0802  . cephALEXin (KEFLEX) capsule 500 mg  500 mg Oral TID Sanjuana Kava, NP   500 mg at 09/10/12 1216  . hydrOXYzine (ATARAX/VISTARIL) tablet 25 mg  25 mg Oral Q6H PRN Rachael Fee, MD   25 mg at 09/08/12 1546  . hydrOXYzine (ATARAX/VISTARIL) tablet 50 mg  50 mg Oral QHS PRN Rachael Fee, MD   50 mg at 09/09/12 2201  . ibuprofen (ADVIL,MOTRIN) tablet 600 mg  600 mg Oral Q6H PRN Assunta Found, NP  600 mg at 09/10/12 0803  . lidocaine-prilocaine (EMLA) cream   Topical Once Rachael Fee, MD      . magnesium hydroxide (MILK OF MAGNESIA) suspension 30 mL  30 mL Oral Daily PRN Kerry Hough, PA-C      . mirtazapine (REMERON SOL-TAB) disintegrating tablet 15 mg  15 mg Oral QHS Rachael Fee, MD   15 mg at 09/09/12 2202  . valACYclovir (VALTREX) tablet 1,000 mg  1,000 mg Oral BID Kerry Hough, PA-C   1,000 mg at 09/10/12 0800    Lab Results: No results found for this or any previous visit (from the past 48 hour(s)).  Physical Findings: AIMS: Facial and Oral Movements Muscles of Facial Expression: None, normal Lips and Perioral Area: None, normal Jaw: None, normal Tongue: None,  normal,Extremity Movements Upper (arms, wrists, hands, fingers): None, normal Lower (legs, knees, ankles, toes): None, normal, Trunk Movements Neck, shoulders, hips: None, normal, Overall Severity Severity of abnormal movements (highest score from questions above): None, normal Incapacitation due to abnormal movements: None, normal Patient's awareness of abnormal movements (rate only patient's report): No Awareness, Dental Status Current problems with teeth and/or dentures?: No Does patient usually wear dentures?: Yes  CIWA:  CIWA-Ar Total: 8 COWS:     Treatment Plan Summary: Daily contact with patient to assess and evaluate symptoms and progress in treatment Medication management  Plan: Supportive approach/coping skills/relapse prevention. Continue Abilify at10 mg TID ObtainTegretol levels on 09/12/12 am. Continue current plan of care.  Medical Decision Making Problem Points:  Review of last therapy session (1) and Review of psycho-social stressors (1) Data Points:  Review of medication regiment & side effects (2) Review of new medications or change in dosage (2)  I certify that inpatient services furnished can reasonably be expected to improve the patient's condition.   [name] 09/10/2012, 2:30 PM

## 2012-09-11 DIAGNOSIS — F3130 Bipolar disorder, current episode depressed, mild or moderate severity, unspecified: Secondary | ICD-10-CM
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
## 2012-09-11 NOTE — BHH Group Notes (Signed)
[facility] LCSW Group Therapy  09/11/2012 3:00-4:00pm  Summary of Progress/Problems:   The main focus of today's process group was for the patient to identify a coping technique that they use in their life which is not beneficial for them, and that they want to change.  The Stages of Change were explained to the group with an emphasis on how important it is to plan the change in their behavior deliberately.  They were then asked what is the single best coping skill that they have used to conquer anxiety or depression, as most of the patients identified those two issues as present in their lives.  The patient expressed nothing in group, read his Bible and slept, then left.  Type of Therapy:  Group Therapy  Participation Level:  None  Participation Quality:  Inattentive  Affect:  Appropriate  Cognitive:  Appropriate and Oriented  Insight:  Engaged  Engagement in Therapy:  Limited  Modes of Intervention:  Discussion and Exploration and Socialization  Sarina Ser 09/11/2012, 6:19 PM

## 2012-09-11 NOTE — Progress Notes (Signed)
[name] cont to struggle to deal with his labile mood and vacillating feelings. He is hypomanic as evidenced by his loud speech, his repetitious story- telling and his religious preoccupation.   A He takes his scheduled meds as ordered . He attends his groups , is engaged in his recovery and is doing his journaling and Sat workbook exercises. He completed his AM self inventory and on it he wrote he cont to have ' off and on" SI , he rated his depression and hopelessness " 7 / 7 " and he did not elaborate on his DC plans.   R Safety is in place and poc cont with therapeutic relationship fostered.

## 2012-09-11 NOTE — Progress Notes (Signed)
[facility] MD Progress Note  09/11/2012 11:01 AM [name]  MRN:  [id number] Subjective:  5/10 depression with no suicidal ideations, feels isolated and rejected by his family but knows he needs to keep his distance to maintain his calm mood, grieving the loss of his family but can't repeat the cycle, going to [facility] for his detox and go to [facility] and develop a new set of supporters Diagnosis:   Axis I: Bipolar, Depressed Axis II: Deferred Axis III:  Past Medical History  Diagnosis Date  . Depression   . Arthritis   . Bipolar 1 disorder   . PTSD (post-traumatic stress disorder)   . Schizophrenia    Axis IV: other psychosocial or environmental problems, problems related to social environment and problems with primary support group Axis V: 41-50 serious symptoms  ADL's:  Intact  Sleep: Good  Appetite:  Fair  Suicidal Ideation:  Denies Homicidal Ideation:  Denies  Psychiatric Specialty Exam: Review of Systems  Constitutional: Negative.   HENT: Negative.   Eyes: Negative.   Respiratory: Negative.   Cardiovascular: Negative.   Gastrointestinal: Negative.   Genitourinary: Negative.   Musculoskeletal: Negative.   Skin: Negative.   Neurological: Negative.   Endo/Heme/Allergies: Negative.   Psychiatric/Behavioral: Positive for depression. The patient is nervous/anxious.     Blood pressure 123/85, pulse 93, temperature 98 F (36.7 C), temperature source Oral, resp. rate 18, height 6\' 1"  (1.854 m), weight 105.688 kg (233 lb).Body mass index is 30.75 kg/(m^2).  General Appearance: Casual  Eye Contact::  Fair  Speech:  Normal Rate  Volume:  Normal  Mood:  Anxious and Depressed  Affect:  Congruent  Thought Process:  Coherent  Orientation:  Full (Time, Place, and Person)  Thought Content:  WDL  Suicidal Thoughts:  No  Homicidal Thoughts:  No  Memory:  Immediate;   Fair Recent;   Fair Remote;   Fair  Judgement:  Fair  Insight:  Fair  Psychomotor Activity:  Normal   Concentration:  Fair  Recall:  Fair  Akathisia:  No  Handed:  Right  AIMS (if indicated):     Assets:  Communication Skills Resilience  Sleep:  Number of Hours: 6.5   Current Medications: Current Facility-Administered Medications  Medication Dose Route Frequency Provider Last Rate Last Dose  . acetaminophen (TYLENOL) tablet 650 mg  650 mg Oral Q6H PRN Kerry Hough, PA-C   650 mg at 09/02/12 0805  . alum & mag hydroxide-simeth (MAALOX/MYLANTA) 200-200-20 MG/5ML suspension 30 mL  30 mL Oral Q4H PRN Kerry Hough, PA-C      . ARIPiprazole (ABILIFY) tablet 10 mg  10 mg Oral TID Rachael Fee, MD   10 mg at 09/11/12 0827  . ARIPiprazole SUSR 400 mg  400 mg Intramuscular Once Rachael Fee, MD      . carbamazepine (Antipsychotic - EQUETRO) 12 hr capsule 200 mg  200 mg Oral BID Rachael Fee, MD   200 mg at 09/11/12 0827  . hydrOXYzine (ATARAX/VISTARIL) tablet 25 mg  25 mg Oral Q6H PRN Rachael Fee, MD   25 mg at 09/08/12 1546  . hydrOXYzine (ATARAX/VISTARIL) tablet 50 mg  50 mg Oral QHS PRN Rachael Fee, MD   50 mg at 09/10/12 2111  . ibuprofen (ADVIL,MOTRIN) tablet 600 mg  600 mg Oral Q6H PRN Shuvon Rankin, NP   600 mg at 09/11/12 9562  . lidocaine-prilocaine (EMLA) cream   Topical Once Rachael Fee, MD      .  magnesium hydroxide (MILK OF MAGNESIA) suspension 30 mL  30 mL Oral Daily PRN Kerry Hough, PA-C      . mirtazapine (REMERON SOL-TAB) disintegrating tablet 15 mg  15 mg Oral QHS Rachael Fee, MD   15 mg at 09/10/12 2115  . valACYclovir (VALTREX) tablet 1,000 mg  1,000 mg Oral BID Kerry Hough, PA-C   1,000 mg at 09/11/12 9604    Lab Results: No results found for this or any previous visit (from the past 48 hour(s)).  Physical Findings: AIMS: Facial and Oral Movements Muscles of Facial Expression: None, normal Lips and Perioral Area: None, normal Jaw: None, normal Tongue: None, normal,Extremity Movements Upper (arms, wrists, hands, fingers): None, normal Lower  (legs, knees, ankles, toes): None, normal, Trunk Movements Neck, shoulders, hips: None, normal, Overall Severity Severity of abnormal movements (highest score from questions above): None, normal Incapacitation due to abnormal movements: None, normal Patient's awareness of abnormal movements (rate only patient's report): No Awareness, Dental Status Current problems with teeth and/or dentures?: No Does patient usually wear dentures?: Yes  CIWA:  CIWA-Ar Total: 8 COWS:     Treatment Plan Summary: Daily contact with patient to assess and evaluate symptoms and progress in treatment Medication management  Plan:  Review of chart, vital signs, medications, and notes. 1-Individual and group therapy 2-Medication management for depression and anxiety:  Medications reviewed with the patient and he stated no untoward effects 3-Coping skills for depression, anxiety, and mood disorder 4-Continue crisis stabilization and management 5-Address health issues--monitoring vital signs, stable 6-Treatment plan in progress to prevent relapse of depression and anxiety  Medical Decision Making Problem Points:  Established problem, stable/improving (1) and Review of psycho-social stressors (1) Data Points:  Review of medication regiment & side effects (2)  I certify that inpatient services furnished can reasonably be expected to improve the patient's condition.   Nanine Means, PMH-NP 09/11/2012, 11:01 AM

## 2012-09-11 NOTE — Progress Notes (Signed)
The patient attended the A. A. Meeting on the 300 hallway. 

## 2012-09-12 NOTE — Progress Notes (Signed)
Patient has been up and active on the unit, attended group this evening and has voiced no complaints. Patient currently denies having pain, -si/hi/a/v hall. Support and encouragement offered, safety maintained on unit, will continue to monitor.  

## 2012-09-12 NOTE — Progress Notes (Signed)
[facility] MD Progress Note  09/12/2012 10:05 AM [name]  MRN:  [id number] Subjective:  5/10 depression and anxiety with "off and on" suicidal thoughts but "decreasing", sleep and appetite good, and stated he will be fine as long as he stays away from his family.  [name] would like to discharge tomorrow.  Diagnosis:   Axis I: Bipolar, Depressed and Substance Abuse Axis II: Deferred Axis III:  Past Medical History  Diagnosis Date  . Depression   . Arthritis   . Bipolar 1 disorder   . PTSD (post-traumatic stress disorder)   . Schizophrenia    Axis IV: economic problems, other psychosocial or environmental problems, problems related to social environment and problems with primary support group Axis V: 41-50 serious symptoms  ADL's:  Intact  Sleep: Good  Appetite:  Good  Suicidal Ideation:  Plan:  None Intent:  None Means:  None Homicidal Ideation:  Denies  Psychiatric Specialty Exam: Review of Systems  Constitutional: Negative.   HENT: Negative.   Eyes: Negative.   Respiratory: Negative.   Cardiovascular: Negative.   Gastrointestinal: Negative.   Genitourinary: Negative.   Musculoskeletal: Negative.   Skin: Negative.   Neurological: Negative.   Endo/Heme/Allergies: Negative.   Psychiatric/Behavioral: Positive for depression and suicidal ideas. The patient is nervous/anxious.     Blood pressure 116/75, pulse 94, temperature 97.7 F (36.5 C), temperature source Oral, resp. rate 16, height 6\' 1"  (1.854 m), weight 105.688 kg (233 lb).Body mass index is 30.75 kg/(m^2).  General Appearance: Casual  Eye Contact::  Fair  Speech:  Normal Rate  Volume:  Normal  Mood:  Anxious and Depressed  Affect:  Congruent  Thought Process:  Coherent  Orientation:  Full (Time, Place, and Person)  Thought Content:  WDL  Suicidal Thoughts:  Yes.  without intent/plan  Homicidal Thoughts:  No  Memory:  Immediate;   Fair Recent;   Fair Remote;   Fair  Judgement:  Fair  Insight:  Fair   Psychomotor Activity:  Normal  Concentration:  Fair  Recall:  Fair  Akathisia:  No  Handed:  Right  AIMS (if indicated):     Assets:  Communication Skills Physical Health Resilience  Sleep:  Number of Hours: 6.5   Current Medications: Current Facility-Administered Medications  Medication Dose Route Frequency Provider Last Rate Last Dose  . acetaminophen (TYLENOL) tablet 650 mg  650 mg Oral Q6H PRN Kerry Hough, PA-C   650 mg at 09/02/12 0805  . alum & mag hydroxide-simeth (MAALOX/MYLANTA) 200-200-20 MG/5ML suspension 30 mL  30 mL Oral Q4H PRN Kerry Hough, PA-C      . ARIPiprazole (ABILIFY) tablet 10 mg  10 mg Oral TID Rachael Fee, MD   10 mg at 09/12/12 0750  . ARIPiprazole SUSR 400 mg  400 mg Intramuscular Once Rachael Fee, MD      . carbamazepine (Antipsychotic - EQUETRO) 12 hr capsule 200 mg  200 mg Oral BID Rachael Fee, MD   200 mg at 09/12/12 0750  . hydrOXYzine (ATARAX/VISTARIL) tablet 25 mg  25 mg Oral Q6H PRN Rachael Fee, MD   25 mg at 09/08/12 1546  . hydrOXYzine (ATARAX/VISTARIL) tablet 50 mg  50 mg Oral QHS PRN Rachael Fee, MD   50 mg at 09/11/12 2142  . ibuprofen (ADVIL,MOTRIN) tablet 600 mg  600 mg Oral Q6H PRN Shuvon Rankin, NP   600 mg at 09/12/12 0752  . lidocaine-prilocaine (EMLA) cream   Topical Once [employer],  MD      . magnesium hydroxide (MILK OF MAGNESIA) suspension 30 mL  30 mL Oral Daily PRN Kerry Hough, PA-C      . mirtazapine (REMERON SOL-TAB) disintegrating tablet 15 mg  15 mg Oral QHS Rachael Fee, MD   15 mg at 09/11/12 2140  . valACYclovir (VALTREX) tablet 1,000 mg  1,000 mg Oral BID Kerry Hough, PA-C   1,000 mg at 09/12/12 1610    Lab Results: No results found for this or any previous visit (from the past 48 hour(s)).  Physical Findings: AIMS: Facial and Oral Movements Muscles of Facial Expression: None, normal Lips and Perioral Area: None, normal Jaw: None, normal Tongue: None, normal,Extremity Movements Upper (arms,  wrists, hands, fingers): None, normal Lower (legs, knees, ankles, toes): None, normal, Trunk Movements Neck, shoulders, hips: None, normal, Overall Severity Severity of abnormal movements (highest score from questions above): None, normal Incapacitation due to abnormal movements: None, normal Patient's awareness of abnormal movements (rate only patient's report): No Awareness, Dental Status Current problems with teeth and/or dentures?: No Does patient usually wear dentures?: Yes  CIWA:  CIWA-Ar Total: 8 COWS:     Treatment Plan Summary: Daily contact with patient to assess and evaluate symptoms and progress in treatment Medication management  Plan:  Review of chart, vital signs, medications, and notes. 1-Individual and group therapy 2-Medication management for depression and anxiety:  Medications reviewed with the patient and he stated no untoward effects, no changes made 3-Coping skills for depression, anxiety, and mood disorder 4-Continue crisis stabilization and management 5-Address health issues--monitoring vital signs, stable 6-Treatment plan in progress to prevent relapse of depression, mood instability, cocaine abuse, and anxiety  Medical Decision Making Problem Points:  Established problem, stable/improving (1) and Review of psycho-social stressors (1) Data Points:  Review of medication regiment & side effects (2)  I certify that inpatient services furnished can reasonably be expected to improve the patient's condition.   Nanine Means, PMH-NP 09/12/2012, 10:05 AM

## 2012-09-13 MED ORDER — HYDROXYZINE HCL 25 MG PO TABS: ORAL_TABLET | ORAL | Status: DC

## 2012-09-13 MED ORDER — VALACYCLOVIR HCL 1 G PO TABS: 1000.0000 mg | ORAL_TABLET | Freq: Two times a day (BID) | ORAL | Status: DC

## 2012-09-13 MED ORDER — CARBAMAZEPINE ER 200 MG PO CP12: 200.0000 mg | ORAL_CAPSULE | Freq: Two times a day (BID) | ORAL | Status: DC

## 2012-09-13 MED ORDER — ARIPIPRAZOLE ER 400 MG IM SUSR: 400.0000 mg | Freq: Once | INTRAMUSCULAR | Status: DC

## 2012-09-13 MED ORDER — CARBAMAZEPINE ER 100 MG PO CP12
200.0000 mg | ORAL_CAPSULE | Freq: Two times a day (BID) | ORAL | Status: DC
  Filled 2012-09-13 (×2): qty 56

## 2012-09-13 MED ORDER — HYDROXYZINE HCL 25 MG PO TABS
25.0000 mg | ORAL_TABLET | Freq: Three times a day (TID) | ORAL | Status: DC | PRN
  Filled 2012-09-13: qty 50

## 2012-09-13 MED ORDER — MIRTAZAPINE 15 MG PO TBDP: 15.0000 mg | ORAL_TABLET | Freq: Every day | ORAL | Status: DC

## 2012-09-13 MED ORDER — ARIPIPRAZOLE 10 MG PO TABS: 10.0000 mg | ORAL_TABLET | Freq: Three times a day (TID) | ORAL | Status: DC

## 2012-09-13 NOTE — Progress Notes (Signed)
[facility] Adult Case Management Discharge Plan :  Will you be returning to the same living situation after discharge: Yes,  return home At discharge, do you have transportation home?:Yes,  taxi and bus vouchers Do you have the ability to pay for your medications:Yes,  no barriers  Release of information consent forms completed and in the chart;  Patient's signature needed at discharge.  Patient to Follow up at: Follow-up Information   Follow up with [facility]  On 09/16/2012. (You will walk into clinic at 8:10am and appointment is for 8:30am (therapy and medication managment) )    Contact information:   [address]  [place], [place] [address] [phone]      Patient denies SI/HI:   Yes,  no reports of SI    Safety Planning and Suicide Prevention discussed:  Yes,  Completed only with patient as he would not let family be contacted.  Nail, Catalina Gravel 09/13/2012, 12:56 PM

## 2012-09-13 NOTE — Progress Notes (Signed)
[facility] Group Notes:  (Nursing/MHT/Case Management/Adjunct)  Date:  09/12/2012 Time:  2000  Type of Therapy:  Psychoeducational Skills  Participation Level:  Active  Participation Quality:  Appropriate  Affect:  Appropriate  Cognitive:  Appropriate  Insight:  Good  Engagement in Group:  Distracting and Off Topic  Modes of Intervention:  Education and Problem-solving  Summary of Progress/Problems: The patient mentioned in group that he had a good day as a whole. 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 In addition, he enjoyed dying the [place] eggs. While his goal is to get discharged tomorrow, he also verbalized that he needs to "love people from a distance".  He understands that their is a love/hate relationship with his mother.   Hazle Coca S 09/13/2012, 12:41 AM

## 2012-09-13 NOTE — [facility] Group Notes (Signed)
[facility] LCSW Aftercare Discharge Planning Group Note   09/13/2012 12:54 PM  Participation Quality:  Active and supportive towards others in the group  Mood/Affect:  Bright and happy  Depression Rating:  3  Anxiety Rating:  4  Thoughts of Suicide:  No Will you contract for safety?   Yes  Current AVH:  No  Plan for Discharge/Comments: Patient reports a good weekend and he is ready to DC today.  [name] is arranged for patient and aftercare in place.   Transportation Means: Taxi/bus vouchers   Supports: Family, mother, church family  Nail, Catalina Gravel
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
## 2012-09-13 NOTE — Progress Notes (Signed)
Recreation Therapy Notes  Date: 04.21.2014  Time: 3:10pm  Location: [facility] Courtyard   Group Topic/Focus: Communication, Problem Solving   Participation Level:  Did not attend  [employer], LRT/CTRS  Jearl Klinefelter 09/13/2012 4:41 PM

## 2012-09-13 NOTE — [facility] Suicide Risk Assessment (Signed)
Suicide Risk Assessment  Discharge Assessment     Demographic Factors:  Male and Unemployed  Mental Status Per Nursing Assessment::   On Admission:  Self-harm thoughts;Self-harm behaviors  Current Mental Status by Physician: Denies suicidal ideas, plans or intent. He has tolerated the Abilify well. His mood is more euthymic. He is going to a 30 day program and from there back to [place]. In his estimation he does better when he stays away from [place]. He states he is committed to taking the medications and staying away from using cocaine    Loss Factors: NA  Historical Factors: NA  Risk Reduction Factors:   Sense of responsibility to family  Continued Clinical Symptoms:  Bipolar Disorder:   Mixed State Alcohol/Substance Abuse/Dependencies Bipolar Disorder manic/hypomanic Cognitive Features That Contribute To Risk:  Closed-mindedness Polarized thinking Thought constriction (tunnel vision)    Suicide Risk:  Minimal: No identifiable suicidal ideation.  Patients presenting with no risk factors but with morbid ruminations; may be classified as minimal risk based on the severity of the depressive symptoms  Discharge Diagnoses:   AXIS I:  Bipolar, Manic, Alcohol abuse, cocaine abuse AXIS II:  Deferred AXIS III:   Past Medical History  Diagnosis Date  . Depression   . Arthritis   . Bipolar 1 disorder   . PTSD (post-traumatic stress disorder)   . Schizophrenia    AXIS IV:  housing problems, other psychosocial or environmental problems and problems with primary support group AXIS V:  61-70 mild symptoms  Plan Of Care/Follow-up recommendations:  Activity:  as tolerated Diet:  regular Follow up outpatient basis Is patient on multiple antipsychotic therapies at discharge:  No   Has Patient had three or more failed trials of antipsychotic monotherapy by history:  No  Recommended Plan for Multiple Antipsychotic Therapies: N/A   Cranston Koors A 09/13/2012, 2:08 PM

## 2012-09-13 NOTE — Progress Notes (Signed)
Patient ID: [name], male   DOB: Nov 14, 1960, 52 y.o.   MRN: [id number] Patient is discharged ambulatory to [place] [place].  He denies SI /HI  He verbalizes understanding of his followup at [facility] and his medications.  He was given scripts and sample meds by MD  He says that he will stay on his medications.

## 2012-09-17 NOTE — Progress Notes (Signed)
Patient Discharge Instructions:  After Visit Summary (AVS):   Faxed to:  09/17/12 Discharge Summary Note:   Faxed to:  09/17/12 Psychiatric Admission Assessment Note:   Faxed to:  09/17/12 Suicide Risk Assessment - Discharge Assessment:   Faxed to:  09/17/12 Faxed/Sent to the Next Level Care provider:  09/17/12 Faxed to [facility] @ [phone]  Jerelene Redden, 09/17/2012, 3:22 PM

## 2012-09-30 ENCOUNTER — Encounter (HOSPITAL_COMMUNITY): Payer: Self-pay | Admitting: Emergency Medicine

## 2012-09-30 ENCOUNTER — Emergency Department (HOSPITAL_COMMUNITY)
Admission: EM | Admit: 2012-09-30 | Discharge: 2012-10-12 | Disposition: A | Discharge status: Other Acute Inpatient Hospital | Payer: Medicaid Other | Attending: Emergency Medicine | Admitting: Emergency Medicine

## 2012-09-30 DIAGNOSIS — Z880 Allergy status to penicillin: Secondary | ICD-10-CM | POA: Insufficient documentation

## 2012-09-30 DIAGNOSIS — Z8659 Personal history of other mental and behavioral disorders: Secondary | ICD-10-CM | POA: Insufficient documentation

## 2012-09-30 DIAGNOSIS — Z87891 Personal history of nicotine dependence: Secondary | ICD-10-CM | POA: Insufficient documentation

## 2012-09-30 DIAGNOSIS — IMO0002 Reserved for concepts with insufficient information to code with codable children: Secondary | ICD-10-CM | POA: Insufficient documentation

## 2012-09-30 DIAGNOSIS — R630 Anorexia: Secondary | ICD-10-CM | POA: Insufficient documentation

## 2012-09-30 DIAGNOSIS — F418 Other specified anxiety disorders: Secondary | ICD-10-CM

## 2012-09-30 DIAGNOSIS — Z8739 Personal history of other diseases of the musculoskeletal system and connective tissue: Secondary | ICD-10-CM | POA: Insufficient documentation

## 2012-09-30 DIAGNOSIS — F341 Dysthymic disorder: Secondary | ICD-10-CM | POA: Insufficient documentation

## 2012-09-30 DIAGNOSIS — Z79899 Other long term (current) drug therapy: Secondary | ICD-10-CM | POA: Insufficient documentation

## 2012-09-30 DIAGNOSIS — F319 Bipolar disorder, unspecified: Secondary | ICD-10-CM | POA: Insufficient documentation

## 2012-09-30 DIAGNOSIS — R45851 Suicidal ideations: Secondary | ICD-10-CM | POA: Insufficient documentation

## 2012-09-30 DIAGNOSIS — F209 Schizophrenia, unspecified: Secondary | ICD-10-CM | POA: Insufficient documentation

## 2012-09-30 LAB — COMPREHENSIVE METABOLIC PANEL
ALT: 55 U/L — ABNORMAL HIGH (ref 0–53)
AST: 133 U/L — ABNORMAL HIGH (ref 0–37)
Albumin: 4.1 g/dL (ref 3.5–5.2)
Alkaline Phosphatase: 87 U/L (ref 39–117)
CO2: 27 mEq/L (ref 19–32)
Calcium: 9.9 mg/dL (ref 8.4–10.5)
Creatinine, Ser: 1.03 mg/dL (ref 0.50–1.35)
GFR calc Af Amer: >90 mL/min (ref >90)
Total Bilirubin: 0.4 mg/dL (ref 0.3–1.2)
Total Protein: 7.8 g/dL (ref 6.0–8.3)

## 2012-09-30 LAB — RAPID URINE DRUG SCREEN, HOSP PERFORMED
Amphetamines: NOT DETECTED
Barbiturates: NOT DETECTED
Benzodiazepines: NOT DETECTED

## 2012-09-30 LAB — ETHANOL: Alcohol, Ethyl (B): 75 mg/dL — ABNORMAL HIGH (ref 0–11)

## 2012-09-30 LAB — CBC
MCH: 30.7 pg (ref 26.0–34.0)
MCHC: 34.6 g/dL (ref 30.0–36.0)
MCV: 88.7 fL (ref 78.0–100.0)
Platelets: 211 10*3/uL (ref 150–400)
RDW: 13 % (ref 11.5–15.5)

## 2012-09-30 MED ORDER — NICOTINE 21 MG/24HR TD PT24
21.0000 mg | MEDICATED_PATCH | Freq: Every day | TRANSDERMAL | Status: DC
  Filled 2012-09-30: qty 1

## 2012-09-30 MED ORDER — LORAZEPAM 1 MG PO TABS
1.0000 mg | ORAL_TABLET | Freq: Once | ORAL | Status: AC
  Administered 2012-09-30: 1 mg via ORAL
  Filled 2012-09-30: qty 2

## 2012-09-30 MED ORDER — ZOLPIDEM TARTRATE 5 MG PO TABS
5.0000 mg | ORAL_TABLET | Freq: Every evening | ORAL | Status: DC | PRN
  Administered 2012-10-01 – 2012-10-06 (×3): 5 mg via ORAL
  Filled 2012-09-30 (×4): qty 1

## 2012-09-30 MED ORDER — ALUM & MAG HYDROXIDE-SIMETH 200-200-20 MG/5ML PO SUSP: 30.0000 mL | ORAL | Status: DC | PRN

## 2012-09-30 MED ORDER — LORAZEPAM 1 MG PO TABS
1.0000 mg | ORAL_TABLET | Freq: Three times a day (TID) | ORAL | Status: DC | PRN
  Administered 2012-10-01 – 2012-10-06 (×11): 1 mg via ORAL
  Filled 2012-09-30 (×12): qty 1

## 2012-09-30 MED ORDER — ONDANSETRON HCL 4 MG PO TABS: 4.0000 mg | ORAL_TABLET | Freq: Three times a day (TID) | ORAL | Status: DC | PRN

## 2012-09-30 MED ORDER — IBUPROFEN 400 MG PO TABS: 600.0000 mg | ORAL_TABLET | Freq: Three times a day (TID) | ORAL | Status: DC | PRN

## 2012-09-30 MED ORDER — ACETAMINOPHEN 325 MG PO TABS
650.0000 mg | ORAL_TABLET | ORAL | Status: DC | PRN
  Administered 2012-10-01: 650 mg via ORAL
  Filled 2012-09-30: qty 2

## 2012-09-30 NOTE — ED Notes (Addendum)
Pt states that he has not taken his medication for the past two weeks. Pt states he has a hx of PTSD, Bipolar Dx, and depression. Pt states he wants to harm himself at this time. PT states he is hearing voices to harm himself. Pt states he believes the world would be better without him.

## 2012-09-30 NOTE — ED Notes (Signed)
ACT team at bedside.  

## 2012-09-30 NOTE — ED Provider Notes (Signed)
History    This chart was scribed for non-physician practitioner Junius Finner working with Gerhard Munch, MD by Quintella Reichert, ED Scribe. This patient was seen in room TR04C/TR04C and the patient's care was started at 7:46 PM .   CSN: [id number]  Arrival date & time 09/30/12  1813       Chief Complaint  Patient presents with  . Medical Clearance     The history is provided by the patient. No language interpreter was used.    HPI Comments: [name] is a 52 y.o. male brought to the Emergency Department complaining of suicidal ideation.  Pt states he was brought here by his brother because pt was trying to get his brother's gun to shoot himself.  He reports hearing voices but states this is at baseline.  He describes the voices as telling him to hurt or kill himself.  Pt also reports he has not been eating or drinking.  Pt has h/o self-injury, depression, bipolar disorder, PTSD and schizophrenia, and states he recently was prescribed Abilify, which caused him to become more manic.  Pt denies HI.  He has h/o substance abuse (cocaine and marijuana) and last used cocaine one week ago.  He also notes that he has drank one six-pack of beer per day for the past three days.  Pt also notes a h/o arthritis.  He denies HTN, heart problems, lung problems, diabetes or any other health problems.  Past Medical History  Diagnosis Date  . Depression   . Arthritis   . Bipolar 1 disorder   . PTSD (post-traumatic stress disorder)   . Schizophrenia     History reviewed. No pertinent past surgical history.  No family history on file.  History  Substance Use Topics  . Smoking status: Former Smoker -- 0.00 packs/day  . Smokeless tobacco: Not on file  . Alcohol Use: 10.8 oz/week    18 Cans of beer per week     Comment: drank 24 cans of beer the past three days       Review of Systems  Constitutional: Positive for appetite change.  Psychiatric/Behavioral: Positive for suicidal ideas,  hallucinations, self-injury (past history of cutting) and agitation.  All other systems reviewed and are negative.    Allergies  Penicillins  Home Medications   Current Outpatient Rx  Name  Route  Sig  Dispense  Refill  . ARIPiprazole (ABILIFY) 10 MG tablet   Oral   Take 10 mg by mouth 3 (three) times daily.         . carbamazepine (EQUETRO) 200 MG CP12   Oral   Take 200 mg by mouth 2 (two) times daily.         . hydrOXYzine (ATARAX/VISTARIL) 25 MG tablet   Oral   Take 25-50 mg by mouth 4 (four) times daily. Take 25 mg 3 times daily and 50 mg at bedtime for sleep/anxiety symptoms         . mirtazapine (REMERON SOL-TAB) 15 MG disintegrating tablet   Oral   Take 15 mg by mouth at bedtime.         . valACYclovir (VALTREX) 1000 MG tablet   Oral   Take 1,000 mg by mouth 2 (two) times daily.           BP 159/89  Pulse 101  Temp(Src) 99.3 F (37.4 C) (Oral)  Resp 18  Ht 6\' 3"  (1.905 m)  Wt 230 lb (104.327 kg)  BMI 28.75 kg/m2  SpO2 96%  Physical Exam  Nursing note and vitals reviewed. Constitutional: He is oriented to person, place, and time. He appears well-developed and well-nourished. No distress.  Pt appears jittery and shaky during H&P  HENT:  Head: Normocephalic and atraumatic.  Eyes: EOM are normal. Pupils are equal, round, and reactive to light.  Neck: Normal range of motion. Neck supple. No tracheal deviation present.  Cardiovascular: Normal rate, regular rhythm and normal heart sounds.   Pulmonary/Chest: Effort normal and breath sounds normal. No respiratory distress.  Abdominal: Soft. Bowel sounds are normal. He exhibits no distension. There is no tenderness.  Musculoskeletal: Normal range of motion.  Neurological: He is alert and oriented to person, place, and time. No cranial nerve deficit.  Skin: Skin is warm and dry.  Psychiatric: His speech is normal and behavior is normal. Judgment normal. His mood appears anxious. Cognition and memory are  normal. He expresses suicidal ideation. He expresses no homicidal ideation.    ED Course  Procedures (including critical care time)  DIAGNOSTIC STUDIES: Oxygen Saturation is 96% on room air, normal by my interpretation.    COORDINATION OF CARE: 7:54 PM-Discussed treatment plan which includes ativan  transferring pt to behavioral health with pt at bedside and pt agreed to plan.     Labs Reviewed  COMPREHENSIVE METABOLIC PANEL - Abnormal; Notable for the following:    AST 133 (*)    ALT 55 (*)    GFR calc non Af Amer 82 (*)    All other components within normal limits  ETHANOL - Abnormal; Notable for the following:    Alcohol, Ethyl (B) 75 (*)    All other components within normal limits  URINE RAPID DRUG SCREEN (HOSP PERFORMED) - Abnormal; Notable for the following:    Cocaine POSITIVE (*)    All other components within normal limits  CBC   No results found.   1. Suicidal ideation   2. Anxious depression       MDM  Pt BIB brother for SI after trying to find his brother's gun to shoot himself.  Pt has hx of SI and self-harm in the past.  Reports hx of PTSD, Bipolar Dx and depression.  He has not taken his medications over the past 2 weeks.  Requesting ativan to calm his nerves and placement in [facility] to help him with his medications. Denies chest pain, abdominal pain, fever, or SOB at this time.  PE: pt appears anxious and jittery, otherwise unremarkable.  Labs: unremarkable.  Drug screen: pos for cocaine.  Etoh: 75.  Pt is medically cleared to go to [facility].  ACT team consulted, spoke with pt and agreed to take over pt care.  I personally performed the services described in this documentation, which was scribed in my presence. The recorded information has been reviewed and is accurate.     Junius Finner, PA-C 10/01/12 1652  Junius Finner, PA-C 10/01/12 [phone]

## 2012-09-30 NOTE — ED Notes (Signed)
Pt wanded by security. 

## 2012-10-01 NOTE — ED Notes (Signed)
Sitter at bedside.
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
## 2012-10-01 NOTE — ED Provider Notes (Signed)
  Medical screening examination/treatment/procedure(s) were performed by non-physician practitioner and as supervising physician I was immediately available for consultation/collaboration.  09/30/12  Gerhard Munch, MD 10/01/12 [phone]

## 2012-10-01 NOTE — BH Assessment (Signed)
[facility] Assessment Progress Note  Pt reviewed with [name], RN, [facility] Director.  She is declining pt for admission to [facility] at this time based upon acuity and need for longer term care than [facility] is able to offer.  At 09:38 I called Tamera Punt, LPC, Assessment Counselor, to notify her.  Doylene Canning, MA Assessment Counselor 10/01/2012 @ 09:44

## 2012-10-01 NOTE — BH Assessment (Signed)
[facility] Assessment Progress Note      Consulted with [name] re another admission of this patient who is currently in [facility]. He has been declined and recommended for a higher level of care.

## 2012-10-02 NOTE — ED Notes (Signed)
Patient complains of feeling anxious and request Ativan, States he continues to hear voices and  he wants to hurt himself.

## 2012-10-02 NOTE — [facility] Counselor (Addendum)
Pt requested to speak with writer to inquire about placement. Writer informed pt that bed availability from [facility] has not been reported, since they were expecting numerous discharges today. Pt reports that "My momma found me with a gun, it was my brothers, but it didn't have any bullets in it" and when asked what he did with the gun the pt said "I put it to my head". Pt reports that he "can't sleep", and "I'm getting paranoid and irritable". Pt asked for counselor Berna Spare so he can get into [facility] "cuz my momma want me to go somewhere longer" and he said "I now I'm voluntary".   Writer returned to ACT office and retrieved message from [facility], she is requested that the pt information (assessment, vitals, labs, Dr. Notes, H&P) is re-faxed in consideration for an open bed. Writer will fax the requested documents to [phone] Denice Bors, AADC 10/02/2012 2:00 PM

## 2012-10-02 NOTE — ED Notes (Signed)
Sitter relieved for lunch break  

## 2012-10-02 NOTE — BH Assessment (Signed)
[facility] Assessment Progress Note   This clinician called [facility] at 23:57 and spoke to [place] there.  She said that she did have the referral and would review patient's information.  She did not know whether they would have any discharges tomorrow (05/11) and was not too optimistic that they would.

## 2012-10-02 NOTE — ED Provider Notes (Addendum)
Filed Vitals:   10/02/12 0606  BP: 112/73  Pulse: 78  Temp: 96 F (35.6 C)  Resp: 18     No complaints overnight. Patient's chart is currently under review at [facility]. Patient has been declined at behavioral health, other beds are currently not available. Patient is given an update. He remains here in a voluntary basis for psychosis with SI. He is medically cleared. Mild elevation of transaminitis likely is transient, not severe, has been elevated in the past. Patient is cocaine positive and initially had an alcohol level of 75 upon initial arrival yesterday.  Gavin Pound. Oletta Lamas, MD 10/02/12 [phone]  Gavin Pound. Oletta Lamas, MD 10/02/12 [phone]

## 2012-10-02 NOTE — [facility] Counselor (Signed)
Writer followed up with [facility] since the pt is under review there. Spoke with Kennon Rounds [phone] and was told that she was waiting for the bed availability report and that she would call back regarding pt's review. Denice Bors, AADC 10/02/2012 11:37 AM

## 2012-10-03 MED ORDER — CARBAMAZEPINE ER 200 MG PO CP12
200.0000 mg | ORAL_CAPSULE | Freq: Two times a day (BID) | ORAL | Status: DC
  Filled 2012-10-03: qty 1

## 2012-10-03 MED ORDER — ARIPIPRAZOLE 10 MG PO TABS
10.0000 mg | ORAL_TABLET | Freq: Three times a day (TID) | ORAL | Status: DC
  Filled 2012-10-03 (×6): qty 1

## 2012-10-03 MED ORDER — MIRTAZAPINE 15 MG PO TBDP
15.0000 mg | ORAL_TABLET | Freq: Every day | ORAL | Status: DC
  Filled 2012-10-03 (×5): qty 1

## 2012-10-03 MED ORDER — HYDROXYZINE HCL 25 MG PO TABS
25.0000 mg | ORAL_TABLET | Freq: Three times a day (TID) | ORAL | Status: DC
  Administered 2012-10-04 – 2012-10-07 (×11): 25 mg via ORAL
  Filled 2012-10-03 (×14): qty 1

## 2012-10-03 MED ORDER — HYDROXYZINE HCL 25 MG PO TABS
50.0000 mg | ORAL_TABLET | Freq: Every day | ORAL | Status: DC
  Administered 2012-10-03 – 2012-10-06 (×4): 50 mg via ORAL
  Filled 2012-10-03 (×3): qty 2

## 2012-10-03 MED ORDER — CARBAMAZEPINE ER 200 MG PO TB12
200.0000 mg | ORAL_TABLET | Freq: Two times a day (BID) | ORAL | Status: DC
  Filled 2012-10-03 (×6): qty 1

## 2012-10-03 MED ORDER — ARIPIPRAZOLE 10 MG PO TABS
10.0000 mg | ORAL_TABLET | Freq: Once | ORAL | Status: DC
  Filled 2012-10-03: qty 1

## 2012-10-03 NOTE — ED Provider Notes (Signed)
Pt resting comfortably, vitals normal. Discussed w act team - they are referring to [facility] for psych tx.     Suzi Roots, MD 10/03/12 [phone]
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
## 2012-10-05 LAB — URINALYSIS, ROUTINE W REFLEX MICROSCOPIC
Glucose, UA: NEGATIVE mg/dL
Ketones, ur: NEGATIVE mg/dL
Protein, ur: NEGATIVE mg/dL
Urobilinogen, UA: 1 mg/dL (ref 0.0–1.0)

## 2012-10-05 LAB — URINE MICROSCOPIC-ADD ON

## 2012-10-05 MED ORDER — SULFAMETHOXAZOLE-TMP DS 800-160 MG PO TABS
1.0000 | ORAL_TABLET | Freq: Two times a day (BID) | ORAL | Status: DC
  Administered 2012-10-05 – 2012-10-07 (×5): 1 via ORAL
  Filled 2012-10-05 (×5): qty 1

## 2012-10-05 NOTE — ED Notes (Signed)
Sitter at the bedside pt eating dinner

## 2012-10-05 NOTE — ED Provider Notes (Signed)
Patient seen in AM. Pending placement for suicidal ideation. UA + UTI, given bactrim. No issues as per nursing.   Richardean Canal, MD 10/05/12 [phone]

## 2012-10-05 NOTE — ED Notes (Signed)
Med given 
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
## 2012-10-05 NOTE — ED Notes (Signed)
Sitter at the bedside.  Pt comfortable.  He reports that he has trouble sleeping

## 2012-10-06 LAB — URINE CULTURE: Colony Count: NO GROWTH
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
## 2012-10-06 NOTE — ED Notes (Signed)
Downtime is now completed. Please refer to paper chart for information re: Pt. During Downtime.

## 2012-10-06 NOTE — ED Notes (Signed)
Pt given drink.  Pt sts that he doesn't like the remeron that it makes him sick to his stomach.  Pt however is requesting some ambien.

## 2012-10-07 MED ORDER — ZOLPIDEM TARTRATE 5 MG PO TABS
5.0000 mg | ORAL_TABLET | Freq: Every evening | ORAL | Status: DC | PRN
  Administered 2012-10-07 – 2012-10-11 (×5): 5 mg via ORAL
  Filled 2012-10-07 (×5): qty 1

## 2012-10-07 MED ORDER — LAMOTRIGINE 25 MG PO TABS
25.0000 mg | ORAL_TABLET | Freq: Every day | ORAL | Status: DC
  Administered 2012-10-07 – 2012-10-11 (×5): 25 mg via ORAL
  Filled 2012-10-07 (×7): qty 1

## 2012-10-07 MED ORDER — IBUPROFEN 200 MG PO TABS
600.0000 mg | ORAL_TABLET | Freq: Three times a day (TID) | ORAL | Status: DC | PRN
  Administered 2012-10-09 – 2012-10-12 (×4): 600 mg via ORAL
  Filled 2012-10-07 (×4): qty 1

## 2012-10-07 MED ORDER — FLUOXETINE HCL 20 MG PO CAPS
20.0000 mg | ORAL_CAPSULE | Freq: Every day | ORAL | Status: DC
  Administered 2012-10-07 – 2012-10-11 (×5): 20 mg via ORAL
  Filled 2012-10-07 (×7): qty 1

## 2012-10-07 MED ORDER — HYDROXYZINE HCL 25 MG PO TABS
25.0000 mg | ORAL_TABLET | Freq: Three times a day (TID) | ORAL | Status: DC
  Administered 2012-10-07 – 2012-10-12 (×14): 25 mg via ORAL
  Filled 2012-10-07 (×13): qty 1

## 2012-10-07 MED ORDER — ALUM & MAG HYDROXIDE-SIMETH 200-200-20 MG/5ML PO SUSP: 30.0000 mL | ORAL | Status: DC | PRN

## 2012-10-07 MED ORDER — ACETAMINOPHEN 325 MG PO TABS: 650.0000 mg | ORAL_TABLET | ORAL | Status: DC | PRN

## 2012-10-07 MED ORDER — LORAZEPAM 1 MG PO TABS
1.0000 mg | ORAL_TABLET | Freq: Three times a day (TID) | ORAL | Status: DC | PRN
Start: 2012-10-07 — End: 2012-10-12
  Administered 2012-10-11: 1 mg via ORAL
  Filled 2012-10-07: qty 1

## 2012-10-07 MED ORDER — RISPERIDONE 0.5 MG PO TABS
2.0000 mg | ORAL_TABLET | Freq: Every day | ORAL | Status: DC
  Administered 2012-10-10: 2 mg via ORAL
  Filled 2012-10-07: qty 4
  Filled 2012-10-07: qty 1
  Filled 2012-10-07 (×3): qty 4

## 2012-10-07 MED ORDER — HYDROXYZINE HCL 25 MG PO TABS
50.0000 mg | ORAL_TABLET | Freq: Every day | ORAL | Status: DC
  Administered 2012-10-07 – 2012-10-11 (×5): 50 mg via ORAL
  Filled 2012-10-07 (×6): qty 2

## 2012-10-07 MED ORDER — RISPERIDONE 0.5 MG PO TABS: 2.0000 mg | ORAL_TABLET | Freq: Every day | ORAL | Status: DC

## 2012-10-07 MED ORDER — SULFAMETHOXAZOLE-TMP DS 800-160 MG PO TABS
1.0000 | ORAL_TABLET | Freq: Two times a day (BID) | ORAL | Status: AC
  Administered 2012-10-07 – 2012-10-11 (×9): 1 via ORAL
  Filled 2012-10-07 (×9): qty 1

## 2012-10-07 MED ORDER — ONDANSETRON HCL 4 MG PO TABS: 4.0000 mg | ORAL_TABLET | Freq: Three times a day (TID) | ORAL | Status: DC | PRN

## 2012-10-07 NOTE — BH Assessment (Signed)
[facility] Assessment Progress Note      Lyda Perone at [facility] reports he doesn't have any information about referrals.  Will need to call back after 0800 for nurse or PA.

## 2012-10-07 NOTE — ED Notes (Signed)
Pt offered a shower but declines. States he will shower tomorrow

## 2012-10-07 NOTE — BH Assessment (Signed)
[facility] Assessment Progress Note      Update:  Pt received a telepsych today per EDP Campos and inpatient treatment recommended.  Per Junious Dresser @ [phone], pt is still on the [facility] wait list.  Called [facility] and per [facility] @ [phone], pt is still under review.  Oncoming staff will need to follow up.

## 2012-10-07 NOTE — ED Notes (Signed)
Pt asking for [place] sandwich and drink.  Pt given both.

## 2012-10-07 NOTE — ED Notes (Addendum)
Pt medications not showing up in pixis. Spoke with pharmacy who will re-enter medications. meds will be administered at that time.
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
## 2012-10-07 NOTE — ED Notes (Signed)
Pt requesting Ativan. 

## 2012-10-07 NOTE — ED Provider Notes (Signed)
Consult to psychiatry this AM. Substance abuse and medication noncompliance. Continues to endorse SI, but vague with multiple plans. Suspect majority is substance abuse. Hx of bipolar, PTSD, schizophrenia and depression  Lyanne Co, MD 10/07/12 0730

## 2012-10-08 NOTE — ED Provider Notes (Signed)
Patient awake alert denies concerns this morning, nurse's report no concerns overnight, patient apparently has ongoing suicidal ideation with history of substance abuse awaiting placement and is IVC.  Hurman Horn, MD 10/18/12 [phone]
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
## 2012-10-08 NOTE — ED Notes (Signed)
Dr bednar rounding on pt

## 2012-10-09 MED ORDER — VALACYCLOVIR HCL 500 MG PO TABS
1000.0000 mg | ORAL_TABLET | Freq: Two times a day (BID) | ORAL | Status: DC
  Administered 2012-10-09 – 2012-10-12 (×7): 1000 mg via ORAL
  Filled 2012-10-09 (×10): qty 2

## 2012-10-09 NOTE — ED Notes (Signed)
Patient continues to read his bible and talk about scriptures with his sitter. Today is the first day patient has been very concentrated on religion and is also very talkative. He is calm and cooperative.
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
## 2012-10-09 NOTE — BH Assessment (Signed)
[facility] Assessment Progress Note      Spoke with Lyda Perone at [facility] who reports the referral has been received, but is still being reviewed.  This clinician inquired how long it usually takes to decide whether the patient is appropriate for admission and he stated, "it depends...maybe five days...most of the time they tell us to put it in as a seven day."  Spoke with Joy at [facility] who confirmed the patient is still on the wait list.

## 2012-10-10 NOTE — ED Notes (Signed)
Pt states "I am just having a religious talk with this fine lady right here." Pt points to sitter. Pt calm and cooperative. Pt denies needs. Pt does not show signs of anxiety or aggression at this time.

## 2012-10-10 NOTE — ED Notes (Signed)
Meal request for dinner placed for pt; request faxed per Crecencio Mc, RN

## 2012-10-10 NOTE — ED Provider Notes (Signed)
Patient seen in AM. Comfortable and sitting in bed with sitter at the bedside. Still pending placement. Psych consult yesterday still recommend admission. No issues as per nursing.   Richardean Canal, MD 10/10/12 [phone]

## 2012-10-10 NOTE — ED Notes (Signed)
Requested Valtrex to be sent from pharmacy; pt notified of delay in medication administration.

## 2012-10-10 NOTE — ED Notes (Signed)
Meal tray arrived. Pt requested coke and ice. Pt given caffeine free coke.

## 2012-10-10 NOTE — ED Notes (Signed)
Tammy Sours, with ACT team at bedside

## 2012-10-10 NOTE — ED Notes (Signed)
Pt discussing religion ideas and beliefs with the sitter at the bedside. Pt remaining calm and not raising voice. Pt does not show signs of distress or anxiety at this time.

## 2012-10-10 NOTE — ED Notes (Signed)
Offered pt shower. Pt denies wanting to shower today. Pt states that if he showers too frequently his arthritis acts up. Pt requested peanut butter. Pt given packets of peanut butter and finished meal tray thrown away. Pt remains calm and cooperative. Pt denies further needs at this time.
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
## 2012-10-10 NOTE — ED Notes (Signed)
Dr Oletta Lamas notified that pt took medications. Dr Oletta Lamas states that he will cancel the tele psych. Selena Batten, Diplomatic Services operational officer notified that Dr Oletta Lamas has canceled the telepsych.

## 2012-10-11 DIAGNOSIS — R45851 Suicidal ideations: Secondary | ICD-10-CM

## 2012-10-11 MED ORDER — RISPERIDONE 0.5 MG PO TABS
2.0000 mg | ORAL_TABLET | Freq: Two times a day (BID) | ORAL | Status: DC
  Administered 2012-10-12: 2 mg via ORAL
  Filled 2012-10-11: qty 4

## 2012-10-11 MED ORDER — LAMOTRIGINE 25 MG PO TABS
50.0000 mg | ORAL_TABLET | Freq: Two times a day (BID) | ORAL | Status: DC
  Administered 2012-10-11 – 2012-10-12 (×2): 50 mg via ORAL
  Filled 2012-10-11 (×5): qty 2

## 2012-10-11 MED ORDER — FLUOXETINE HCL 20 MG PO CAPS
40.0000 mg | ORAL_CAPSULE | Freq: Every day | ORAL | Status: DC
  Administered 2012-10-12: 40 mg via ORAL
  Filled 2012-10-11 (×2): qty 2
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
## 2012-10-11 NOTE — ED Notes (Signed)
Pt pacing around room, states Dr. Joanette Gula made him mad.

## 2012-10-11 NOTE — ED Notes (Signed)
Pt states "that medicine doesn't work for me- that nurse told me I had to take it- but it makes me nauseated"
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
## 2012-10-11 NOTE — ED Notes (Signed)
Spoke with Dr. Rhunette Croft regarding medication and pt's concern of nausea from Risperdol- orders received.

## 2012-10-11 NOTE — BH Assessment (Signed)
[facility] Assessment Progress Note      Dr. Elsie Saas came to evaluate pt and inpatient treatment recommended and medication adjustment recommended.  Therefore, pt will remain on wait list for [facility] and ACT will reevaluate daily.

## 2012-10-12 ENCOUNTER — Emergency Department (EMERGENCY_DEPARTMENT_HOSPITAL)
Admission: EM | Admit: 2012-10-12 | Discharge: 2012-10-15 | Disposition: A | Discharge status: Other Acute Inpatient Hospital | Payer: Medicaid Other | Source: Home / Self Care

## 2012-10-12 DIAGNOSIS — F314 Bipolar disorder, current episode depressed, severe, without psychotic features: Secondary | ICD-10-CM | POA: Diagnosis present

## 2012-10-12 DIAGNOSIS — F1410 Cocaine abuse, uncomplicated: Secondary | ICD-10-CM

## 2012-10-12 DIAGNOSIS — F312 Bipolar disorder, current episode manic severe with psychotic features: Secondary | ICD-10-CM

## 2012-10-12 DIAGNOSIS — F29 Unspecified psychosis not due to a substance or known physiological condition: Secondary | ICD-10-CM

## 2012-10-12 DIAGNOSIS — F1020 Alcohol dependence, uncomplicated: Secondary | ICD-10-CM | POA: Diagnosis present

## 2012-10-12 DIAGNOSIS — F1420 Cocaine dependence, uncomplicated: Secondary | ICD-10-CM | POA: Diagnosis present

## 2012-10-12 DIAGNOSIS — F1010 Alcohol abuse, uncomplicated: Secondary | ICD-10-CM

## 2012-10-12 MED ORDER — LAMOTRIGINE 25 MG PO TABS
50.0000 mg | ORAL_TABLET | Freq: Two times a day (BID) | ORAL | Status: DC
  Administered 2012-10-12 – 2012-10-15 (×6): 50 mg via ORAL
  Filled 2012-10-12 (×7): qty 2

## 2012-10-12 MED ORDER — RISPERIDONE 2 MG PO TABS
2.0000 mg | ORAL_TABLET | Freq: Two times a day (BID) | ORAL | Status: DC
  Administered 2012-10-12 – 2012-10-15 (×6): 2 mg via ORAL
  Filled 2012-10-12 (×6): qty 1

## 2012-10-12 MED ORDER — LORAZEPAM 1 MG PO TABS
1.0000 mg | ORAL_TABLET | Freq: Three times a day (TID) | ORAL | Status: DC | PRN
  Administered 2012-10-13 – 2012-10-15 (×5): 1 mg via ORAL
  Filled 2012-10-12 (×5): qty 1

## 2012-10-12 MED ORDER — ZOLPIDEM TARTRATE 5 MG PO TABS
5.0000 mg | ORAL_TABLET | Freq: Every evening | ORAL | Status: DC | PRN
  Administered 2012-10-12 – 2012-10-14 (×3): 5 mg via ORAL
  Filled 2012-10-12 (×3): qty 1

## 2012-10-12 MED ORDER — PAROXETINE HCL 20 MG PO TABS
40.0000 mg | ORAL_TABLET | Freq: Every day | ORAL | Status: DC
  Administered 2012-10-12 – 2012-10-15 (×4): 40 mg via ORAL
  Filled 2012-10-12 (×4): qty 2

## 2012-10-12 MED ORDER — CHLORDIAZEPOXIDE HCL 25 MG PO CAPS: 25.0000 mg | ORAL_CAPSULE | Freq: Four times a day (QID) | ORAL | Status: DC | PRN

## 2012-10-12 MED ORDER — VALACYCLOVIR HCL 500 MG PO TABS
1000.0000 mg | ORAL_TABLET | Freq: Two times a day (BID) | ORAL | Status: DC
  Administered 2012-10-12 – 2012-10-15 (×6): 1000 mg via ORAL
  Filled 2012-10-12 (×7): qty 2

## 2012-10-12 NOTE — Clinical Social Work Psych Note (Addendum)
Documentation deleted.  Entered under wrong patient.    Vickii Penna, LCSWA [phone]  Clinical Social Work

## 2012-10-12 NOTE — ED Notes (Signed)
Pt transferred from [facility] hospital to [facility] ED unit. It was told in report patient was at [facility] for 200 hours he was  manic and would not stay in his room and speaking of religion and handing out fliers.  Pt needed to be moved here for a more controlled atmosphere. Pt calm and cooperative in his room now. No signs or symptoms of distress. Alert and oriented x4. No complain of pain.

## 2012-10-12 NOTE — ED Notes (Signed)
Pt states he feels like the previous medication he was on  abilify,  made him more manic but he states now this new medication we are trying him on he feels better. Pt states he is sleeping better with his new medication and he is still hearing voices but he cannot make out what they are saying. Pt states he still is depressed and still feels suicidal.  But he is hopeful the prozac will help him feel better in the next couple days. Pt is calm and cooperative. No signs or symptoms of distress. Pt laying in bed covered up and has TV on. Pt ate half of his dinner tray and said he is still going to eat more and to leave the tray in the room a little longer. Pt asked for a Coke to drink.
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
## 2012-10-12 NOTE — ED Notes (Signed)
Discussed nature of orders for pt with Darryll Capers, Asst Director and advocated for less restrictive privileges. Orders for restrictions have been adjusted to exclude TV privileges until assessed in AM

## 2012-10-13 ENCOUNTER — Encounter (HOSPITAL_COMMUNITY): Payer: Self-pay | Admitting: Emergency Medicine

## 2012-10-13 DIAGNOSIS — F312 Bipolar disorder, current episode manic severe with psychotic features: Secondary | ICD-10-CM

## 2012-10-13 DIAGNOSIS — F1010 Alcohol abuse, uncomplicated: Secondary | ICD-10-CM

## 2012-10-13 DIAGNOSIS — F1410 Cocaine abuse, uncomplicated: Secondary | ICD-10-CM

## 2012-10-13 MED ORDER — IBUPROFEN 600 MG PO TABS
600.0000 mg | ORAL_TABLET | Freq: Four times a day (QID) | ORAL | Status: DC | PRN
  Administered 2012-10-13 – 2012-10-15 (×3): 600 mg via ORAL
  Filled 2012-10-13 (×3): qty 1

## 2012-10-13 NOTE — ED Notes (Signed)
Patient requests "phone numbers" from belongings, when patient shown personal belongings he requests black supremacist propaganda documents. Patient currently religiously/racially preoccupied. Patient verbalizes "Why are you censoring me, it was the white man who stole all the land from the indians not me."  Patient offered support and encouragement. Will continue to monitor.

## 2012-10-13 NOTE — Consult Note (Signed)
Reason for Consult: Endorsing suicide by shooting self  [name] is an 52 y.o. male.  HPI: Patient continues to endorse SI.  Patient states that he needs help.  "I need some help.  I have been fighting these demons a long time.  [facility] can't help me they want to do thing the quick way; I need to go some where that can help me.  I know that it is the demons and that I am cursed.  They wanted me to take the injections but I am not dumb I come from a very inelegant family and I know what happen to the black men in [place].  I am not racist there has been more white people to help me than black but I don't know what the government is giving me in that injection.  I know that I need help getting theses demons out of me and I know everyone has given up on me, my family everybody but my saviour Jesus Christ.  Patient did ask if he could have his Bible.    Past Medical History  Diagnosis Date  . Depression   . Arthritis   . Bipolar 1 disorder   . PTSD (post-traumatic stress disorder)   . Schizophrenia     No past surgical history on file.  No family history on file.  Social History:  reports that he has quit smoking. He does not have any smokeless tobacco history on file. He reports that he drinks about 10.8 ounces of alcohol per week. He reports that he uses illicit drugs (Cocaine).  Allergies:  Allergies  Allergen Reactions  . Penicillins Nausea And Vomiting    Medications: I have reviewed the patient's current medications.  No results found for this or any previous visit (from the past 48 hour(s)).  No results found.  Review of Systems  Constitutional: Negative.   HENT: Negative.   Eyes: Negative.   Respiratory: Negative.   Cardiovascular: Negative.   Gastrointestinal: Negative.   Genitourinary: Negative.   Musculoskeletal: Negative.   Skin: Negative.   Neurological: Negative.   Endo/Heme/Allergies: Negative.   Psychiatric/Behavioral: Positive for depression, suicidal  ideas and substance abuse. Negative for hallucinations and memory loss. The patient is nervous/anxious. The patient does not have insomnia.    Blood pressure 116/66, pulse 87, temperature 98.2 F (36.8 C), temperature source Oral, resp. rate 16, SpO2 100.00%. Physical Exam  Constitutional: He is oriented to person, place, and time. He appears well-developed and well-nourished.  HENT:  Head: Normocephalic and atraumatic.  Right Ear: External ear normal.  Left Ear: External ear normal.  Eyes: Conjunctivae and EOM are normal. Pupils are equal, round, and reactive to light.  Neck: Normal range of motion. Neck supple.  Cardiovascular: Normal rate, regular rhythm and normal heart sounds.   Respiratory: Effort normal and breath sounds normal.  GI: Soft. Bowel sounds are normal.  Musculoskeletal: Normal range of motion.  Neurological: He is alert and oriented to person, place, and time. He has normal reflexes. No cranial nerve deficit.  Skin: Skin is warm and dry.  Psychiatric: His speech is normal. His mood appears anxious. His affect is blunt. He is agitated. He is not actively hallucinating and not combative. Cognition and memory are normal. He expresses impulsivity. He expresses suicidal ideation. He expresses no homicidal ideation. He expresses suicidal plans.    Assessment/Plan: At this time patient remains on [facility] wait list. ACT/CSW  Will look for placement through out the state Patient also met with  [facility] who agreed to act services once patient is psychiatrically stable.  And continue current treatment plan and care  Shuvon B. Rankin FNP-BC Family Nurse Practitioner, Board Certified   Rankin, Shuvon 10/13/2012, 1:43 PM

## 2012-10-13 NOTE — BH Assessment (Signed)
[facility] Assessment Progress Note      10/13/12.  2040.  [facility] called back and declined pt due to problem being primary substance abuse. Daleen Squibb, LCSWA

## 2012-10-13 NOTE — ED Provider Notes (Signed)
Pt sleeping comfortably and has done well in the last 24 hours without acute issue.  He as seen by psychiatrist on Mon with medication changes and still deemed that he need hospitalization.  Still on the waiting list at [facility].  Gwyneth Sprout, MD 10/13/12 [phone]

## 2012-10-13 NOTE — ED Notes (Signed)
Patient given PRN Ativan for complaints of anxiety. Will continue to monitor.

## 2012-10-13 NOTE — BHH Counselor (Signed)
Information faxed to the following hospitals for possible placement:  [facility] [place] ([place]) [facility]

## 2012-10-13 NOTE — Progress Notes (Signed)
Pt discussed in treatment team. At this time patient remains on [facility] wait list. ACT/CSW also looking into other placements in the state. Patient also met with [facility] who agreed to act services once patient is psychiatrically stable.   Catha Gosselin, LCSWA  [phone] 10/13/2012 1145am

## 2012-10-14 ENCOUNTER — Encounter (HOSPITAL_COMMUNITY): Payer: Self-pay | Admitting: Registered Nurse

## 2012-10-14 MED ORDER — HYDROXYZINE HCL 25 MG PO TABS
25.0000 mg | ORAL_TABLET | Freq: Two times a day (BID) | ORAL | Status: DC | PRN
  Administered 2012-10-14 – 2012-10-15 (×2): 25 mg via ORAL
  Filled 2012-10-14 (×2): qty 1

## 2012-10-14 NOTE — Progress Notes (Signed)
Per discussion with treatment team patient interested in group home placement. CSW looking for possible group home placements. CSW spoke with Pietro Cassis at [phone] at [facility]. Ms. Cardell Peach requesting FL2 and pasarr. CSW will complete Fl2 and send to group home, pasarr pending patient psychiatric progression.   Catha Gosselin, LCSWA  [phone] .10/14/2012 1238pm

## 2012-10-14 NOTE — ED Notes (Signed)
Pt is in bed and appears to be asleep. Pts respirations are even and unlabored.Pt shows no signs of discomfort.

## 2012-10-14 NOTE — ED Notes (Signed)
Pt is standing still in his room watching TV.

## 2012-10-14 NOTE — Progress Notes (Signed)
CSW and NP met with pt in pt room to assess patient. Patient, NP, and CSW discussed patient goals. Patient shared that he is interested in going to a drug rehab program or a group home. Patient, NP, and CSw discussed that in order to move forward, patient would need to be feeling good and ready for this transition. Patient stated, "I am feeling happy now, when I was feeling hopeless that is when I had thoughts of hurting myself. When you can't see past tomorrow, and you don't know where you're going, and your mother says you can't come back home, you have no hope." Now after meeting everyone and getting help, I have hope." CSW asked patient to clarify and asked, "At this time are you feeling suicidal? Patient replied "No, I'm happy as I have ever been." CSW asked, "Are you having any current thoughts of hurting yourself?" Patient replied, "No".   Catha Gosselin, LCSWA  [phone] .10/14/2012 1731pm

## 2012-10-14 NOTE — ED Notes (Signed)
Up to the desk on the phone 

## 2012-10-14 NOTE — ED Notes (Signed)
Pt is calm, laying in bed watching TV

## 2012-10-14 NOTE — ED Notes (Addendum)
[name] at [facility] updated and reports that the pt has been turned down based on acuity.  ACT aware.

## 2012-10-14 NOTE — ED Notes (Addendum)
Up in room restless, but not anxious.  Declined to see chaplin or word puzzles at this time. NAD, pleasant, cooperative, is excited about possibilities of placement, but is aware that it is still early in the process.

## 2012-10-14 NOTE — ED Notes (Signed)
Pt is in bed watching TV.The Pt shows no signs distress.The patient seems comfortable and looks to be getting ready for bed.

## 2012-10-14 NOTE — ED Notes (Signed)
[facility] into see

## 2012-10-14 NOTE — Consult Note (Signed)
Reason for Consult:  Follow up consult Referring Physician:   Kierre [name] is an 52 y.o. male.  HPI: Patient spoke with Donnel Saxon, MSW, LCSW from [facility].  Patient is willing to work with [facility].  Patient states that he is feeling much better.  Patient states "I am feeling better.  I just want my medications right.  If I can get my medications right I will do better.  When I'm out there and I'm not taking my medications or can't get my medications that when I feel like getting a gun and putting it to my head."  Patient states that he really would like to be placed in a group home.  Patient comments and statements of SI passive at this time.  Patient in pleasant mood.  Patient is corporative and wanting assistance and to get better.  Patient states "I would prefer to get help out side of the hospital; I am tired and my family is tired of me repeatedly coming into the hospital to get help for my problems.  I want to be better; I want to be independent; I want it to be where there is somewhere else to turn instead of coming to the hospital.  I am embarrassed and my family is embarrassed about me continually having to come to the hospital for help."  Past Medical History  Diagnosis Date  . Depression   . Arthritis   . Bipolar 1 disorder   . PTSD (post-traumatic stress disorder)   . Schizophrenia     History reviewed. No pertinent past surgical history.  History reviewed. No pertinent family history.  Social History:  reports that he has quit smoking. He does not have any smokeless tobacco history on file. He reports that he drinks about 10.8 ounces of alcohol per week. He reports that he uses illicit drugs (Cocaine).  Allergies:  Allergies  Allergen Reactions  . Penicillins Nausea And Vomiting    Medications: I have reviewed the patient's current medications.  No results found for this or any previous visit (from the past 48 hour(s)).  No results  found.  Review of Systems  Constitutional: Negative.   HENT: Negative.   Eyes: Negative.   Respiratory: Negative.   Cardiovascular: Negative.   Gastrointestinal: Negative.   Genitourinary: Negative.   Musculoskeletal: Negative.   Skin: Negative.   Neurological: Negative.   Endo/Heme/Allergies: Negative.   Psychiatric/Behavioral: Positive for depression, suicidal ideas and hallucinations ("I feel much better now.  The voices are only a whisper now"). The patient is nervous/anxious. The patient does not have insomnia.        Patient states that he is feeling much better.  Patient states that he just want to get his medications stabilized and have a place and resources to help when he get out of the hospital,   Blood pressure 100/63, pulse 81, temperature 98 F (36.7 C), temperature source Oral, resp. rate 18, SpO2 96.00%. Physical Exam  Constitutional: He is oriented to person, place, and time. He appears well-developed and well-nourished.  HENT:  Head: Normocephalic and atraumatic.  Eyes: Pupils are equal, round, and reactive to light.  Neck: Normal range of motion. Neck supple.  Respiratory: Effort normal.  Musculoskeletal: Normal range of motion.  Neurological: He is alert and oriented to person, place, and time.  Skin: Skin is warm and dry.    Assessment/Plan: Referred to [facility] (waiting to see if accepted) Look for group home placement to live after patient is  discharged/stable Continue current treatment plan  Kimberlynn Lumbra B. Onnie Hatchel FNP-BC Family Nurse Practitioner, Board Certified  Suzann Lazaro B. Adalynn Corne FNP-BC Family Nurse Practitioner, Board Certified   Louna Rothgeb 10/14/2012, 10:01 AM

## 2012-10-14 NOTE — ED Notes (Signed)
Pt is in bed awake and watching TV.  Patient is shows no signs of discomfort.Pt is appropiate and is communicating with staff.

## 2012-10-14 NOTE — ED Notes (Signed)
Pt remains in his room, sitting and conversing with representative from [facility].

## 2012-10-14 NOTE — ED Notes (Signed)
Pt is lying in bed after eating supper.

## 2012-10-14 NOTE — ED Notes (Signed)
Up to the bathroom 

## 2012-10-14 NOTE — BH Assessment (Signed)
BHH Assessment Progress Note      Patient declined at [facility] for his acuity/chronicity. Long term treatment was recommended.

## 2012-10-14 NOTE — ED Notes (Signed)
Pt very pleasant and joking with Clinical research associate as night time medications were given.

## 2012-10-14 NOTE — ED Notes (Signed)
Pt is currently lying in bed with his head covered up.

## 2012-10-14 NOTE — ED Notes (Signed)
Pt is currently still in consult with representative from [facility], making large unthreatening gestures with his hands as he communicates.

## 2012-10-14 NOTE — ED Notes (Signed)
Pt still laying in bed.  Pt remains calm and pleasant.

## 2012-10-14 NOTE — Progress Notes (Signed)
[name]  [birth year]  [phone]  Spoke met with patient along with case worker Cleda Mccreedy.    Patient, NP, and CSW discussed the patient goals.  Patient states that he is interested in going to a drug rehab.  "I would really like to go to a drug rehab.  That would really make me happy.  I know that is something I need to work on.  Patient also expressed his interest in going to a group home after completing a drug rehab program.  "After the rehab, I can go to a group home right?  That would make me happy and my family.  When there is a plan; when I know I have somewhere to go; when I can see past tomorrow.  I have hope When you know you can't go back home because your mother is tired and says that you can't come back home that is enough to make you up set; but knowing there is a plan and that I will have some where to go.  That gives me hope; I feel good.  When asked patient if he was feeling suicidal patient stated "No after meeting everybody I'm feeling like I'm getting help; I feel there is a plan; I have hope; I am happier than I have ever been.  No I am not having any thoughts of suicide now."      Shuvon B. Rankin FNP-BC Family Nurse Practitioner, Board Certified  10/14/2012  17:50 PM   .

## 2012-10-14 NOTE — ED Notes (Signed)
Pt still laying in bed watching TV dozing off and on.

## 2012-10-14 NOTE — ED Notes (Signed)
Report given to Haskel Schroeder RN at [facility] hospital-she reports that they need a EKG.

## 2012-10-14 NOTE — ED Notes (Signed)
Shavon NP and social worker into talk w/ pt

## 2012-10-14 NOTE — ED Notes (Addendum)
TA, social worker, Darryll Capers into see

## 2012-10-14 NOTE — ED Notes (Signed)
Pt is lying awake in bed tossing and turning while watching TV.

## 2012-10-14 NOTE — ED Notes (Signed)
Pt is sitting on his bed speaking with a representative from [facility].

## 2012-10-14 NOTE — ED Notes (Signed)
Pt is standing in his room eating potato chips, watching TV, swinging arms back and forth, clapping, dancing, and covering his face with his hands at times.

## 2012-10-14 NOTE — ED Notes (Signed)
Pt is currently laying in bed awake. Pt is calm and shows no sign of distress.

## 2012-10-14 NOTE — ED Notes (Signed)
Pt is in bed appears to be asleep. Pts respirations are even and unlabored. Pt shows no signs of discomfort

## 2012-10-14 NOTE — ED Notes (Signed)
TA in w/ pt

## 2012-10-14 NOTE — ED Notes (Signed)
Pt is in bed and is awake. Pt is watching TV.Pt shows do signs of discomfort.pt is calm and respectful.

## 2012-10-14 NOTE — ED Notes (Signed)
Pt at nurse's station on phone with his mother. Pt calm and cooperative at this time. Pt has been in his room pacing and swinging arms prior to coming out to make call.

## 2012-10-14 NOTE — ED Notes (Signed)
Pt sitting at foot of his bed with his head down.

## 2012-10-14 NOTE — ED Notes (Signed)
Pt sitting at his table in his room eating supper.

## 2012-10-14 NOTE — ED Notes (Signed)
Pt is lying still in bed resting, turned toward the wall.

## 2012-10-14 NOTE — ED Notes (Signed)
Pt up to bathroom. Requesting more gatorade with his meds

## 2012-10-14 NOTE — ED Notes (Signed)
Shavon NP into see

## 2012-10-14 NOTE — ED Notes (Signed)
Darryll Capers RN into see

## 2012-10-14 NOTE — ED Notes (Signed)
Pt is in bed and is asleep.Pts respiration are even and unlabored.Pt shows no signs of distress.

## 2012-10-14 NOTE — ED Notes (Signed)
Pt is sitting in room calmly eating his lunch. No signs of distress at this time.

## 2012-10-14 NOTE — ED Notes (Signed)
Pt appears to be sleeping.

## 2012-10-14 NOTE — ED Notes (Signed)
Talking quietly w/ TA

## 2012-10-14 NOTE — ED Notes (Signed)
Shavon NP in w/ pt

## 2012-10-14 NOTE — ED Notes (Signed)
Pt is standing in room interacting pleasantly with staff.

## 2012-10-14 NOTE — ED Notes (Signed)
Pt is sitting on his bed watching TV.

## 2012-10-14 NOTE — ED Notes (Signed)
Patient in room sitting on  bed and is talking to the nurse.Patient shows no signs of discomfort .

## 2012-10-14 NOTE — ED Notes (Signed)
Pt is laying down watching TV. He said he's nodding off and on watching TV. He said the gatorade and Financial controller gave him really hit the spot and Forensic psychologist. He's still calm and pleasant. No needs at this time.

## 2012-10-14 NOTE — ED Provider Notes (Signed)
Assuming care of patient his morning.  Patient in the ED for paranoia and psychoses. Awaiting placement. Workup thus far is negative. Patient had no complains, no concerns from the nursing side. Will continue to monitor.  Filed Vitals:   10/14/12 0702  BP: 100/63  Pulse: 81  Temp:   Resp: 18      Derwood Kaplan, MD 10/14/12 0732

## 2012-10-14 NOTE — ED Notes (Signed)
Pt reports having a good day today and has hope since the SW talked to him about going to a GH. He said he's never lived in one before and currently lives with his mom. He reports his siblings don't understand his mental illness and feel he's putting his mom through too much. He said that once you try to hurt yourself it puts everybody on high alert. Writer agreed and stated that's because they love you and want you around and pt agreed. Pt reports that he still has voices but they are mumbled, they haven't told him to hurt himself since he's been here at [facility]. He admits to fleeting thoughts of hurting himself but no longer has a plan. Pt reports he see his deceased GM at times and she tells him it's not his time to come to heaven yet. He said he can smell her shampoo at times. He reports he was very close to his grandmother and could always go to her with anything and she'd bake him a spice cake and he'd go to bed and knew all would be well, he felt safe there. Pt was very appropriate with this RN, speaks intelligently, had good eye contact and engaged well. His voice is kind of soft and low. He is calm and knows he has to be patient during this process.

## 2012-10-14 NOTE — BHH Counselor (Signed)
Patient's assessment faxed to [facility]; pending review.

## 2012-10-14 NOTE — ED Notes (Addendum)
Pt continues to lay in bed awake.  Pt has head covered up and is twitching his foot.  Pt is calm and shows no signs of distress.

## 2012-10-14 NOTE — ED Notes (Signed)
Pt sitting in his room in consultation with [facility].

## 2012-10-14 NOTE — ED Notes (Signed)
Pt sitting in his room having his vital signs taken by RN. Pt conversing with RN.

## 2012-10-14 NOTE — ED Notes (Signed)
Snack given.

## 2012-10-14 NOTE — ED Notes (Signed)
Pt is  in bed appears to still be awake.Pt is resting on his stomach. Pt shows no signs of discomfort

## 2012-10-15 LAB — URINALYSIS, ROUTINE W REFLEX MICROSCOPIC
Bilirubin Urine: NEGATIVE
Nitrite: NEGATIVE
Specific Gravity, Urine: 1.014 (ref 1.005–1.030)
pH: 7 (ref 5.0–8.0)

## 2012-10-15 LAB — URINE MICROSCOPIC-ADD ON

## 2012-10-15 NOTE — ED Notes (Signed)
Pt is in bed and asleep.Pts respirations are even and unlabored.Pt shows no signs of discomfort.

## 2012-10-15 NOTE — ED Notes (Signed)
PT is in the bed and is asleep.Pts respiration are even and unlabored.Pt shows no signs of discomfort.

## 2012-10-15 NOTE — ED Notes (Signed)
Ptis in bed asleep. Pts respiration are even and unlabored Pt shows no signs of discomfort

## 2012-10-15 NOTE — ED Notes (Signed)
Pt is asleep in the bed.Pt respiration are even and unlabored Pt shows no signs of discomfort.

## 2012-10-15 NOTE — ED Notes (Signed)
Pt is in bed and appears to be resting with his eyes closed.Pt respirations are even and unlabored.Pt shows no signs of discomfort.

## 2012-10-15 NOTE — ED Notes (Signed)
Pt is in the bed and is resting with his eyes closed.Pt shows no signs of discomfort.

## 2012-10-15 NOTE — ED Notes (Signed)
Pt appears to be asleep .Pt respiration are even and unlabored.     Pt shows no signs of discomfort.

## 2012-10-15 NOTE — Progress Notes (Signed)
CSW met with pt in patient to follow up with patient regarding speaking with pt mother. Pt stated he spoke with his mother, however does not want to speak with social work until patient has been placed. CSW and pt discussed, this Clinical research associate is the hospital and would need to discuss with pt  Mother while patient is here in the hosptial. Patient inquired about the [facility] rehab program, csw informed patient that I had not heard back yet, and we were looking at other options. CSW and pt discussed possibly a shelter with substance abuse treatment and support from patient TA transitional care team. Patient stated that a shelter is not an option as he feels they have drugs and alcohol and it would be a straight ticket for him to shoot himself or relapse. Patient stated, "I'm starting to feeling hopeless again because now I feel in limbo again." CSW and pt discussed that at this time we are just looking into different options and wanting to work together to find the best plan and that this Clinical research associate was continuing to look into the [place] [facility] and the group homes. Patient stated, 'I'm sorry i'm feeling bad, I know you're working hard, and its not you, I just feel hopeless when I dont feel there is a plan" CSW and pt discussed that patient still has all the support to surrounding him from the transitional care team, this CSW, and providers. Patient stated he was aware of his supports but still wants to know a plan. Patient stated he feels hopeless as well because his mother is upset at him, and wont cooperate with anyone at the hospital and she is handling all his fiances. Patient states, "My mom has already told me she's going to be my payee and wants to be in control of all of my money. Patient states is always about her controlling what happens." CSW and pt discussed that csw will continue to work on options and there is still hope of finding a good plans and that patient still has all the support. Pt thanked csw  . Patient  Stated he would call his mother to see if she would reconsider talking to the social worker.  CSW received update from nurse that patient did talk to mother and still refuses to talk to the social worker. Per nurse, patient is now stating that he is suicidal and is feeling hopeless.   Catha Gosselin, LCSWA  [phone] .10/15/2012 12:05pm

## 2012-10-15 NOTE — BHH Counselor (Signed)
Received a call from [place] stating that patient was accepted this morning to [facility] and no one has responded back. Writer informed Junious Dresser that patient still needed the bed.   Patient accepted to [facility] pending faxed 5 day vitals and 5 days of MAR's. Writer gathered information and faxed to [facility].   Junious Dresser also requested update IVC papers. Writer checked patient's chart and found a set of legal's/ IVC, however; they were signed incorrectly by [employer] during the change from [facility] to [facility]. Writer verified with sheriff that [facility] would not take papers unless they were signed in the designed area. Writer re-initiated and assisted with the completion of the IVC paperwork. The IVC paperwork was completed/signed by Dr. Silas [place]. Faxed to the magistrate; confirmed receipt of fax with [employer]. Awaiting IVC to be served to patient here at [facility]. Once served Clinical research associate will fax documents to [facility].   Writer notified patient of his disposition. He appeared happy and smiled asking, "When can I go?"

## 2012-10-15 NOTE — ED Notes (Signed)
Pt pleasant this AM. States he feels much better knowing that there are a couple of good options for him (residential/group home). States he feels like this is a good plan and his family is in agreement. States he is not hearing voices and denies SI. Feels like he is at his baseline and offers no questions or concerns.

## 2012-10-15 NOTE — ED Notes (Signed)
Pt is the bed appear to be asleep.Pt respirations are even and unlabored. Pt shows no signs of discomfort.

## 2012-10-15 NOTE — ED Notes (Signed)
Pt is in bed and is asleep,Pts respiraton are even and unlabored.Pt shows no signs of discomfort.

## 2012-10-15 NOTE — ED Notes (Signed)
Pt is in the bed asleep.Pt shows no signs of distress. Pt respirations are even and unlabored.

## 2012-10-15 NOTE — ED Notes (Signed)
Pt is in bed and is asleep.Pt respiration are even and unlabored.Pt show no signs discomfort

## 2012-10-15 NOTE — ED Notes (Signed)
Pt is in bed asleep. Pt respirations are even and unlabored.Pt shows no signs of discomfort.

## 2012-10-15 NOTE — ED Notes (Signed)
The pt is asleep in his bed.The Pt respiration are even and unlabored.Pt shows signs ofdisstress

## 2012-10-15 NOTE — ED Notes (Signed)
Pt is in bed and awake . Pt shows no signs of discomfort.Pt is respectful and appropiate.

## 2012-10-15 NOTE — ED Notes (Signed)
Pt is awake and came to desk and ask for the remote for TV.Pt also went to the restroom.Pt ask for remote in a respectful manner.Pt shows no signs of discomfort.

## 2012-10-15 NOTE — ED Notes (Signed)
Pt is in bed and asleep.Pts respirations are even and unlabored .P tshows no sign of discomfort

## 2012-10-15 NOTE — ED Notes (Signed)
Pt is in the bed and is asleep.Pts respirations are even and unlabored.Pt shows no signs of discomfort.

## 2012-10-15 NOTE — ED Notes (Signed)
Pt up to bathroom.

## 2012-10-15 NOTE — ED Notes (Addendum)
Pt up to bathroom. Pt sitting on side of bed drinking gatorade.

## 2012-10-15 NOTE — ED Notes (Signed)
Pt is in the bed and is asleep. Pt respiration are even and unlabored the patient shows no sign discomfort.

## 2012-10-15 NOTE — Consult Note (Signed)
Reason for Consult: Cocaine intoxication and non compliance with medications Referring Physician: Dr. Patsey Berthold [name] is an 52 y.o. male.  HPI: Patient refused to talk to this provider even after few attempts but able to communicate with staff RN and CSW.   MSE: Patient was observed standing in his room and watching television.   Past Medical History  Diagnosis Date  . Depression   . Arthritis   . Bipolar 1 disorder   . PTSD (post-traumatic stress disorder)   . Schizophrenia     History reviewed. No pertinent past surgical history.  History reviewed. No pertinent family history.  Social History:  reports that he has quit smoking. He does not have any smokeless tobacco history on file. He reports that he drinks about 10.8 ounces of alcohol per week. He reports that he uses illicit drugs (Cocaine).  Allergies:  Allergies  Allergen Reactions  . Penicillins Nausea And Vomiting    Medications: I have reviewed the patient's current medications.  No results found for this or any previous visit (from the past 48 hour(s)).  No results found.  Positive for anxiety, behavior problems, bipolar, depression, illegal drug usage and sleep disturbance Blood pressure 105/69, pulse 76, temperature 98.5 F (36.9 C), temperature source Oral, resp. rate 18, SpO2 98.00%.   Assessment/Plan: Cocaine intoxication Psychosis NOS vs schizophrenia  Recommendation: Continue Lamotrigine 50 mg PO BID, Risperidal 2 mg  PO BID and Paxil 40 mg QD. Recommended in patient acute psych hospitalization for safety and secure therapeutic milieu and further medication management.    [name],Perry R. 10/15/2012, 11:52 AM     and

## 2012-10-15 NOTE — ED Notes (Signed)
Pt is in bed and is asleep.Pts respiration are even and unlabored. Pt shows no signs of discomfort

## 2012-10-15 NOTE — ED Notes (Signed)
Pt is in bed and asleep,Pts respiration are even and unlabored.The pt shows no signs of discomfort

## 2012-10-15 NOTE — ED Provider Notes (Addendum)
Patient sleeping comfortably.  No issues overnight per nursing staff. UA from 5/12 appeared infected, but subsequent culture was negative.  BP 122/78  Pulse 97  Temp(Src) 98.4 F (36.9 C) (Oral)  Resp 20  SpO2 100%   Glynn Octave, MD 10/15/12 0750  [facility]. He has no complaints. He continues to endorse feeling suicidal and hearing voices. Calm and cooperative.  BP 105/69  Pulse 76  Temp(Src) 98.5 F (36.9 C) (Oral)  Resp 18  SpO2 98%   Glynn Octave, MD 10/15/12 1422  Glynn Octave, MD 10/15/12 1425

## 2012-10-15 NOTE — ED Notes (Signed)
Pt is in bed asleep.Pt respirations are even and unlabored .Pt shows no signs o discomfort.

## 2012-10-15 NOTE — ED Notes (Signed)
Ptis in the bed and is asleep.Pt respiration are even and unlabored.Pt show no signs of discomfort.

## 2012-10-29 NOTE — Progress Notes (Signed)
CSW received call from [facility] [facility] [place] [facility] team [phone], that patient was discharged from [facility] and unable to locate patient. Please call Kirt Boys from Transitional care if patient arrives in the ED.   Marland KitchenCatha Gosselin, Theresia Majors  [phone] .10/29/2012 10:56am

## 2012-11-24 ENCOUNTER — Emergency Department (HOSPITAL_COMMUNITY)
Admission: EM | Admit: 2012-11-24 | Discharge: 2012-11-26 | Disposition: A | Discharge status: Other Acute Inpatient Hospital | Payer: Medicaid Other | Attending: Emergency Medicine | Admitting: Emergency Medicine

## 2012-11-24 ENCOUNTER — Encounter (HOSPITAL_COMMUNITY): Payer: Self-pay | Admitting: *Deleted

## 2012-11-24 DIAGNOSIS — F3113 Bipolar disorder, current episode manic without psychotic features, severe: Secondary | ICD-10-CM

## 2012-11-24 DIAGNOSIS — G479 Sleep disorder, unspecified: Secondary | ICD-10-CM | POA: Insufficient documentation

## 2012-11-24 DIAGNOSIS — Z880 Allergy status to penicillin: Secondary | ICD-10-CM | POA: Insufficient documentation

## 2012-11-24 DIAGNOSIS — Z87891 Personal history of nicotine dependence: Secondary | ICD-10-CM | POA: Insufficient documentation

## 2012-11-24 DIAGNOSIS — F329 Major depressive disorder, single episode, unspecified: Secondary | ICD-10-CM

## 2012-11-24 DIAGNOSIS — F1010 Alcohol abuse, uncomplicated: Secondary | ICD-10-CM

## 2012-11-24 DIAGNOSIS — F3289 Other specified depressive episodes: Secondary | ICD-10-CM

## 2012-11-24 DIAGNOSIS — M129 Arthropathy, unspecified: Secondary | ICD-10-CM | POA: Insufficient documentation

## 2012-11-24 DIAGNOSIS — F209 Schizophrenia, unspecified: Secondary | ICD-10-CM | POA: Insufficient documentation

## 2012-11-24 DIAGNOSIS — F1910 Other psychoactive substance abuse, uncomplicated: Secondary | ICD-10-CM

## 2012-11-24 DIAGNOSIS — R000 Tachycardia, unspecified: Secondary | ICD-10-CM | POA: Insufficient documentation

## 2012-11-24 DIAGNOSIS — F39 Unspecified mood [affective] disorder: Secondary | ICD-10-CM | POA: Insufficient documentation

## 2012-11-24 DIAGNOSIS — R45851 Suicidal ideations: Secondary | ICD-10-CM

## 2012-11-24 DIAGNOSIS — F4310 Post-traumatic stress disorder, unspecified: Secondary | ICD-10-CM | POA: Insufficient documentation

## 2012-11-24 DIAGNOSIS — F319 Bipolar disorder, unspecified: Secondary | ICD-10-CM | POA: Insufficient documentation

## 2012-11-24 DIAGNOSIS — R443 Hallucinations, unspecified: Secondary | ICD-10-CM

## 2012-11-24 DIAGNOSIS — Z79899 Other long term (current) drug therapy: Secondary | ICD-10-CM | POA: Insufficient documentation

## 2012-11-24 LAB — URINALYSIS, ROUTINE W REFLEX MICROSCOPIC
Glucose, UA: NEGATIVE mg/dL
Ketones, ur: NEGATIVE mg/dL
Protein, ur: NEGATIVE mg/dL
pH: 6 (ref 5.0–8.0)

## 2012-11-24 LAB — CBC WITH DIFFERENTIAL/PLATELET
Basophils Absolute: 0 10*3/uL (ref 0.0–0.1)
Basophils Relative: 0 % (ref 0–1)
HCT: 45.2 % (ref 39.0–52.0)
Lymphocytes Relative: 21 % (ref 12–46)
Monocytes Absolute: 0.4 10*3/uL (ref 0.1–1.0)
Neutro Abs: 4.7 10*3/uL (ref 1.7–7.7)
Neutrophils Relative %: 70 % (ref 43–77)
Platelets: 236 10*3/uL (ref 150–400)
RDW: 13.3 % (ref 11.5–15.5)
WBC: 6.6 10*3/uL (ref 4.0–10.5)

## 2012-11-24 LAB — ETHANOL: Alcohol, Ethyl (B): 100 mg/dL — ABNORMAL HIGH (ref 0–11)

## 2012-11-24 LAB — RAPID URINE DRUG SCREEN, HOSP PERFORMED
Amphetamines: NOT DETECTED
Benzodiazepines: NOT DETECTED
Cocaine: POSITIVE — AB
Opiates: NOT DETECTED

## 2012-11-24 LAB — BASIC METABOLIC PANEL
CO2: 21 mEq/L (ref 19–32)
Chloride: 96 mEq/L (ref 96–112)
GFR calc Af Amer: >90 mL/min (ref >90)
Potassium: 3.7 mEq/L (ref 3.5–5.1)
Sodium: 134 mEq/L — ABNORMAL LOW (ref 135–145)

## 2012-11-24 LAB — URINE MICROSCOPIC-ADD ON

## 2012-11-24 MED ORDER — ACETAMINOPHEN 325 MG PO TABS: 650.0000 mg | ORAL_TABLET | ORAL | Status: DC | PRN

## 2012-11-24 MED ORDER — POTASSIUM CHLORIDE CRYS ER 20 MEQ PO TBCR: 40.0000 meq | EXTENDED_RELEASE_TABLET | Freq: Once | ORAL | Status: DC

## 2012-11-24 MED ORDER — HYDROXYZINE HCL 25 MG PO TABS: 25.0000 mg | ORAL_TABLET | Freq: Three times a day (TID) | ORAL | Status: DC | PRN

## 2012-11-24 MED ORDER — ADULT MULTIVITAMIN W/MINERALS CH
1.0000 | ORAL_TABLET | Freq: Every day | ORAL | Status: DC
  Administered 2012-11-24 – 2012-11-26 (×3): 1 via ORAL
  Filled 2012-11-24 (×3): qty 1

## 2012-11-24 MED ORDER — LORAZEPAM 1 MG PO TABS: 1.0000 mg | ORAL_TABLET | Freq: Three times a day (TID) | ORAL | Status: DC | PRN

## 2012-11-24 MED ORDER — CARBAMAZEPINE 200 MG PO TABS
200.0000 mg | ORAL_TABLET | Freq: Two times a day (BID) | ORAL | Status: DC
  Filled 2012-11-24 (×3): qty 1

## 2012-11-24 MED ORDER — FOLIC ACID 1 MG PO TABS
1.0000 mg | ORAL_TABLET | Freq: Every day | ORAL | Status: DC
  Administered 2012-11-24 – 2012-11-26 (×3): 1 mg via ORAL
  Filled 2012-11-24 (×3): qty 1

## 2012-11-24 MED ORDER — VALACYCLOVIR HCL 500 MG PO TABS
500.0000 mg | ORAL_TABLET | Freq: Two times a day (BID) | ORAL | Status: DC
  Administered 2012-11-24 – 2012-11-26 (×4): 500 mg via ORAL
  Filled 2012-11-24 (×5): qty 1

## 2012-11-24 MED ORDER — ARIPIPRAZOLE 10 MG PO TABS
10.0000 mg | ORAL_TABLET | Freq: Three times a day (TID) | ORAL | Status: DC
  Administered 2012-11-24 – 2012-11-26 (×5): 10 mg via ORAL
  Filled 2012-11-24 (×8): qty 1

## 2012-11-24 MED ORDER — LORAZEPAM 2 MG/ML IJ SOLN: 1.0000 mg | Freq: Four times a day (QID) | INTRAMUSCULAR | Status: DC | PRN

## 2012-11-24 MED ORDER — THIAMINE HCL 100 MG/ML IJ SOLN: 100.0000 mg | Freq: Every day | INTRAMUSCULAR | Status: DC

## 2012-11-24 MED ORDER — ACYCLOVIR 200 MG PO CAPS
400.0000 mg | ORAL_CAPSULE | Freq: Two times a day (BID) | ORAL | Status: DC
  Filled 2012-11-24: qty 2

## 2012-11-24 MED ORDER — VALACYCLOVIR HCL 500 MG PO TABS: 1000.0000 mg | ORAL_TABLET | Freq: Two times a day (BID) | ORAL | Status: DC

## 2012-11-24 MED ORDER — ZOLPIDEM TARTRATE 5 MG PO TABS: 5.0000 mg | ORAL_TABLET | Freq: Every evening | ORAL | Status: DC | PRN

## 2012-11-24 MED ORDER — VITAMIN B-1 100 MG PO TABS
100.0000 mg | ORAL_TABLET | Freq: Every day | ORAL | Status: DC
  Administered 2012-11-24 – 2012-11-26 (×3): 100 mg via ORAL
  Filled 2012-11-24 (×3): qty 1

## 2012-11-24 MED ORDER — ONDANSETRON HCL 4 MG PO TABS: 4.0000 mg | ORAL_TABLET | Freq: Three times a day (TID) | ORAL | Status: DC | PRN

## 2012-11-24 MED ORDER — LORAZEPAM 1 MG PO TABS
1.0000 mg | ORAL_TABLET | Freq: Four times a day (QID) | ORAL | Status: DC | PRN
  Administered 2012-11-24 – 2012-11-25 (×4): 1 mg via ORAL
  Filled 2012-11-24 (×4): qty 1

## 2012-11-24 MED ORDER — IBUPROFEN 200 MG PO TABS: 600.0000 mg | ORAL_TABLET | Freq: Three times a day (TID) | ORAL | Status: DC | PRN

## 2012-11-24 MED ORDER — ALUM & MAG HYDROXIDE-SIMETH 200-200-20 MG/5ML PO SUSP: 30.0000 mL | ORAL | Status: DC | PRN

## 2012-11-24 MED ORDER — MIRTAZAPINE 30 MG PO TABS
15.0000 mg | ORAL_TABLET | Freq: Every day | ORAL | Status: DC
  Filled 2012-11-24 (×2): qty 1

## 2012-11-24 NOTE — ED Notes (Signed)
[employer] called to gas station; pt requesting to come to the hospital to speak to someone; voiced suicidal thoughts to [employer]; pt calm and cooperative on arrival

## 2012-11-24 NOTE — ED Notes (Signed)
Pt states he went to [place] visiting and got off his bipolar and PTSD meds x 2 wks; states has been walking the streets for 3 days doing drugs and alcohol; states having thoughts of killing himself with plans to step out in front of car or cut himself; calm and cooperative on arrival; called [employer] to bring to hospital for help

## 2012-11-24 NOTE — ED Provider Notes (Signed)
History    CSN: [id number] Arrival date & time 11/24/12  7829  First MD Initiated Contact with Patient 11/24/12 [phone]     Chief Complaint  Patient presents with  . Medical Clearance   (Consider location/radiation/quality/duration/timing/severity/associated sxs/prior Treatment) HPI  Patient is a 52 year old male past medical history significant for depression, bipolar 1 disorder, PTSD, schizophrenia and arthritis presented to the emergency department for suicidal ideation again he days ago with plan to start out into traffic. Patient is also having auditory hallucinations. Denies any homicidal ideation. Patient does state that he is out of his bipolar and PTSD medications for 2 weeks. Patient states he has been using alcohol and cocaine the last few days. Patient denies any chest pain, shortness of breath, nausea, vomiting, abdominal pain.  Past Medical History  Diagnosis Date  . Depression   . Arthritis   . Bipolar 1 disorder   . PTSD (post-traumatic stress disorder)   . Schizophrenia    History reviewed. No pertinent past surgical history. No family history on file. History  Substance Use Topics  . Smoking status: Former Smoker -- 0.00 packs/day  . Smokeless tobacco: Not on file  . Alcohol Use: 10.8 oz/week    18 Cans of beer per week     Comment: drank 24 cans of beer the past three days     Review of Systems  Constitutional: Negative for fever and chills.  Respiratory: Negative for shortness of breath.   Cardiovascular: Negative for chest pain.  Gastrointestinal: Negative for nausea, vomiting and abdominal pain.  Psychiatric/Behavioral: Positive for suicidal ideas, sleep disturbance and dysphoric mood.  All other systems reviewed and are negative.    Allergies  Penicillins  Home Medications   Current Outpatient Rx  Name  Route  Sig  Dispense  Refill  . hydrOXYzine (ATARAX/VISTARIL) 25 MG tablet   Oral   Take 25-50 mg by mouth every 8 (eight) hours as needed.           Marland Kitchen LORazepam (ATIVAN) 0.5 MG tablet   Oral   Take 0.5 mg by mouth every 8 (eight) hours as needed for anxiety.         . valACYclovir (VALTREX) 1000 MG tablet   Oral   Take 1,000 mg by mouth 2 (two) times daily.          BP 138/83  Pulse 105  Temp(Src) 98.5 F (36.9 C) (Oral)  Resp 20  SpO2 96% Physical Exam  Constitutional: He is oriented to person, place, and time. He appears well-developed and well-nourished.  HENT:  Head: Normocephalic and atraumatic.  Eyes: Conjunctivae are normal.  Neck: Neck supple.  Cardiovascular: Regular rhythm and normal heart sounds.  Tachycardia present.   Pulmonary/Chest: Effort normal and breath sounds normal. No respiratory distress.  Abdominal: Soft. There is no tenderness.  Neurological: He is alert and oriented to person, place, and time.  Skin: Skin is warm and dry.  Psychiatric: He is slowed. He exhibits a depressed mood. He expresses suicidal ideation.    ED Course  Procedures (including critical care time) Labs Reviewed  BASIC METABOLIC PANEL - Abnormal; Notable for the following:    Sodium 134 (*)    All other components within normal limits  ETHANOL - Abnormal; Notable for the following:    Alcohol, Ethyl (B) 100 (*)    All other components within normal limits  URINE RAPID DRUG SCREEN (HOSP PERFORMED) - Abnormal; Notable for the following:    Cocaine POSITIVE (*)  All other components within normal limits  URINALYSIS, ROUTINE W REFLEX MICROSCOPIC - Abnormal; Notable for the following:    Hgb urine dipstick TRACE (*)    Leukocytes, UA MODERATE (*)    All other components within normal limits  SALICYLATE LEVEL - Abnormal; Notable for the following:    Salicylate Lvl <2.0 (*)    All other components within normal limits  CBC WITH DIFFERENTIAL  URINE MICROSCOPIC-ADD ON  ACETAMINOPHEN LEVEL   No results found. 1. Suicidal ideation     MDM  Patient suicidal w/ plan. Labs reviewed. PE unremarkable. Patient  stable to move to [facility] for further evaluation. Psych hold orders placed. ACT and psychiatry consulted.   Lise Auer Maycol Hoying, PA-C 11/24/12 1600
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
## 2012-11-24 NOTE — ED Provider Notes (Signed)
Medical screening examination/treatment/procedure(s) were performed by non-physician practitioner and as supervising physician I was immediately available for consultation/collaboration.  Olivia Mackie, MD 11/24/12 2107

## 2012-11-24 NOTE — ED Notes (Signed)
Pt changed into scrubs; wanded by security; belongings bag x 1; green pants/black belt/black socks/black shoes/black shirt/cigarette lighters x 2; states has no valuables that need to be locked in security

## 2012-11-24 NOTE — Progress Notes (Signed)
Pt referred to [facility], pending review.  No beds at [facility], [facility], [facility], [facility], or [facility]. P [facility] referral started.  Pt also referred to [facility].   Marland KitchenCatha Gosselin, [place]  [phone] 11/24/2012 1657pm

## 2012-11-24 NOTE — Consult Note (Signed)
Reason for Consult:Eval for IP psychiatric mgmt Referring Physician:[facility]  Zaim Nitta is an 52 y.o. male.  HPI: Pt is a 52 y/o AAM well known to [facility] with hx of bipolar d/o with multiple manic exacerbations secondary to medical non-compliance and polysubstance abuse. Patient is presenting voluntary to [facility] ED with concerns with active SI with an (SA) lying on the street for traffic to hit him. Patient states that he is also having thoughts of wanting [employer] to shoot him. Patient also endorses non command AVH but denies any HI at this present time. Patient cannot contract for safety. And endorses being non compliant with his psychotropics along with continued use of cocaine.  Past Medical History  Diagnosis Date  . Depression   . Arthritis   . Bipolar 1 disorder   . PTSD (post-traumatic stress disorder)   . Schizophrenia     History reviewed. No pertinent past surgical history.  No family history on file.  Social History:  reports that he has quit smoking. He does not have any smokeless tobacco history on file. He reports that he drinks about 10.8 ounces of alcohol per week. He reports that he uses illicit drugs (Cocaine).  Allergies:  Allergies  Allergen Reactions  . Penicillins Nausea And Vomiting    Medications: I have reviewed the patient's current medications.  Results for orders placed during the hospital encounter of 11/24/12 (from the past 48 hour(s))  URINALYSIS, ROUTINE W REFLEX MICROSCOPIC     Status: Abnormal   Collection Time    11/24/12  6:06 AM      Result Value Range   Color, Urine YELLOW  YELLOW   APPearance CLEAR  CLEAR   Specific Gravity, Urine 1.009  1.005 - 1.030   pH 6.0  5.0 - 8.0   Glucose, UA NEGATIVE  NEGATIVE mg/dL   Hgb urine dipstick TRACE (*) NEGATIVE   Bilirubin Urine NEGATIVE  NEGATIVE   Ketones, ur NEGATIVE  NEGATIVE mg/dL   Protein, ur NEGATIVE  NEGATIVE mg/dL   Urobilinogen, UA 0.2  0.0 - 1.0 mg/dL   Nitrite NEGATIVE  NEGATIVE   Leukocytes,  UA MODERATE (*) NEGATIVE  URINE MICROSCOPIC-ADD ON     Status: None   Collection Time    11/24/12  6:06 AM      Result Value Range   Squamous Epithelial / LPF RARE  RARE   WBC, UA 3-6  <3 WBC/hpf   RBC / HPF 0-2  <3 RBC/hpf   Bacteria, UA RARE  RARE  URINE RAPID DRUG SCREEN (HOSP PERFORMED)     Status: Abnormal   Collection Time    11/24/12  6:16 AM      Result Value Range   Opiates NONE DETECTED  NONE DETECTED   Cocaine POSITIVE (*) NONE DETECTED   Benzodiazepines NONE DETECTED  NONE DETECTED   Amphetamines NONE DETECTED  NONE DETECTED   Tetrahydrocannabinol NONE DETECTED  NONE DETECTED   Barbiturates NONE DETECTED  NONE DETECTED   Comment:            DRUG SCREEN FOR MEDICAL PURPOSES     ONLY.  IF CONFIRMATION IS NEEDED     FOR ANY PURPOSE, NOTIFY LAB     WITHIN 5 DAYS.                LOWEST DETECTABLE LIMITS     FOR URINE DRUG SCREEN     Drug Class       Cutoff (ng/mL)  Amphetamine      1000     Barbiturate      200     Benzodiazepine   200     Tricyclics       300     Opiates          300     Cocaine          300     THC              50  CBC WITH DIFFERENTIAL     Status: None   Collection Time    11/24/12  6:25 AM      Result Value Range   WBC 6.6  4.0 - 10.5 K/uL   RBC 5.01  4.22 - 5.81 MIL/uL   Hemoglobin 15.2  13.0 - 17.0 g/dL   HCT 40.9  81.1 - 91.4 %   MCV 90.2  78.0 - 100.0 fL   MCH 30.3  26.0 - 34.0 pg   MCHC 33.6  30.0 - 36.0 g/dL   RDW 78.2  95.6 - 21.3 %   Platelets 236  150 - 400 K/uL   Neutrophils Relative % 70  43 - 77 %   Neutro Abs 4.7  1.7 - 7.7 K/uL   Lymphocytes Relative 21  12 - 46 %   Lymphs Abs 1.4  0.7 - 4.0 K/uL   Monocytes Relative 7  3 - 12 %   Monocytes Absolute 0.4  0.1 - 1.0 K/uL   Eosinophils Relative 2  0 - 5 %   Eosinophils Absolute 0.1  0.0 - 0.7 K/uL   Basophils Relative 0  0 - 1 %   Basophils Absolute 0.0  0.0 - 0.1 K/uL  BASIC METABOLIC PANEL     Status: Abnormal   Collection Time    11/24/12  6:25 AM      Result  Value Range   Sodium 134 (*) 135 - 145 mEq/L   Potassium 3.7  3.5 - 5.1 mEq/L   Chloride 96  96 - 112 mEq/L   CO2 21  19 - 32 mEq/L   Glucose, Bld 96  70 - 99 mg/dL   BUN 11  6 - 23 mg/dL   Creatinine, Ser 0.86  0.50 - 1.35 mg/dL   Calcium 9.6  8.4 - 57.8 mg/dL   GFR calc non Af Amer >90  >90 mL/min   GFR calc Af Amer >90  >90 mL/min   Comment:            The eGFR has been calculated     using the CKD EPI equation.     This calculation has not been     validated in all clinical     situations.     eGFR's persistently     <90 mL/min signify     possible Chronic Kidney Disease.  ETHANOL     Status: Abnormal   Collection Time    11/24/12  6:25 AM      Result Value Range   Alcohol, Ethyl (B) 100 (*) 0 - 11 mg/dL   Comment:            LOWEST DETECTABLE LIMIT FOR     SERUM ALCOHOL IS 11 mg/dL     FOR MEDICAL PURPOSES ONLY  ACETAMINOPHEN LEVEL     Status: None   Collection Time    11/24/12 11:02 AM      Result Value Range   Acetaminophen (Tylenol), Serum <15.0  10 - 30 ug/mL   Comment:            THERAPEUTIC CONCENTRATIONS VARY     SIGNIFICANTLY. A RANGE OF 10-30     ug/mL MAY BE AN EFFECTIVE     CONCENTRATION FOR MANY PATIENTS.     HOWEVER, SOME ARE BEST TREATED     AT CONCENTRATIONS OUTSIDE THIS     RANGE.     ACETAMINOPHEN CONCENTRATIONS     >150 ug/mL AT 4 HOURS AFTER     INGESTION AND >50 ug/mL AT 12     HOURS AFTER INGESTION ARE     OFTEN ASSOCIATED WITH TOXIC     REACTIONS.  SALICYLATE LEVEL     Status: Abnormal   Collection Time    11/24/12 11:02 AM      Result Value Range   Salicylate Lvl <2.0 (*) 2.8 - 20.0 mg/dL    No results found.  Review of Systems  Psychiatric/Behavioral: Positive for depression, suicidal ideas, hallucinations and substance abuse. Negative for memory loss. The patient is not nervous/anxious and does not have insomnia.   All other systems reviewed and are negative.   Blood pressure 130/74, pulse 109, temperature 98.9 F (37.2 C),  temperature source Oral, resp. rate 20, SpO2 98.00%. Physical Exam  Constitutional: He is oriented to person, place, and time. He appears well-developed and well-nourished.  HENT:  Head: Normocephalic.  Eyes: Pupils are equal, round, and reactive to light.  Neck: Normal range of motion. Neck supple.  Cardiovascular: Normal rate and regular rhythm.   Respiratory: Effort normal and breath sounds normal.  Neurological: He is alert and oriented to person, place, and time. No cranial nerve deficit.  Skin: Skin is warm and dry.  Psychiatric:  Good eye contact, flat affect with pressured speech, limited insight and plethora of ideas    Assessment/Plan: 1) Patient has exhausted all psychiatric resources at [facility] recommend long -term psychiatric IP placement at a state facility.  SIMON,SPENCER E 11/24/2012, 10:24 PM   11/25/2012  Mr Madlock says his mother will not take him home until he gets help with his drug use.  Says he has been drinking and using cocaine.  He has been in detox more than once.  He has been saying he has suicidal thoughts but now is willing to contract for safety.  I am recommending detox whenever a bed is available.

## 2012-11-24 NOTE — Progress Notes (Signed)
CSW spoke with Kirt Boys at [facility]. Per Kirt Boys, patient was at [facility] and was able to contract safety. Pt told TA that he was going to [place] and that his agent had put money into his account for a speaking engagement, and was going to [place]. Per Kirt Boys, patient left [facility] . Pt was taken from [facility] to [facility] by TA, and reestablished, and then he stayed in the [facility]. Patient stayed one night, and then called Amber the next morning on 10/23/2012 and that he was going out of town to [place] for speaking engagements, and no longer had any contact. Patient is now not active with TA and is not able to be reestablished at this time.   Catha Gosselin, LCSWA  [phone] .11/24/2012 11:02am

## 2012-11-24 NOTE — ED Notes (Signed)
Pt given juice and sandwich; pacing in room; sitter at bedside

## 2012-11-24 NOTE — ED Notes (Signed)
Sitter at bedside.

## 2012-11-24 NOTE — Plan of Care (Signed)
[facility] Crisis Plan   Reason for Crisis Plan:  Chronic Mental Illness/Medical Illness   Plan of Care:  Referral for Inpatient Hospitalization  Family Support:    Mother [name] [phone]   Current Living Environment:    homeless, staying here and there, and with mother.   Insurance:   Hospital Account   Name Acct ID Class Status Primary Coverage   [name], [name] [id number] Emergency Open MEDICAID [place] - MEDICAID [place] ACCESS        Guarantor Account (for Hospital Account [id number])   Name Relation to Pt Service Area Active? Acct Type   [name] Self CHSA Yes Personal/Family   Address Phone       [address] [place], [place] [address] [phone](H)          Coverage Information (for Hospital Account [id number])   F/O Payor/Plan Precert #   MEDICAID [place]/MEDICAID [place] ACCESS    Subscriber Subscriber #   [place], [place] [id number]   Address Phone   [address] [address] [place], [place] [address] [phone]      Legal Guardian:   n/[name]   Primary Care Provider:  No PCP Per Patient  Current Outpatient Providers:  [name]  Psychiatrist:   Vesta [name]   Counselor/Therapist:     Compliant with Medications:  No  Additional Information: Patient was set up with [facility] team in May, however refused services and went to [place] for [name] speaking engagment, was commited at [employer] and received [facility] however pt reports side effects of shuffled gait, drooling, and stiffness.   Patient currently agreeable to [facility] services, and has signed consents.  Patient currently not compliant with medications or following up with patient established care at [facility].    [name] 7/2/20142:48 PM

## 2012-11-25 MED ORDER — HYDROXYZINE HCL 25 MG PO TABS
25.0000 mg | ORAL_TABLET | Freq: Three times a day (TID) | ORAL | Status: DC
  Administered 2012-11-25 – 2012-11-26 (×4): 25 mg via ORAL
  Filled 2012-11-25 (×4): qty 1

## 2012-11-25 MED ORDER — BENZTROPINE MESYLATE 1 MG PO TABS
1.0000 mg | ORAL_TABLET | Freq: Two times a day (BID) | ORAL | Status: DC
  Administered 2012-11-25 – 2012-11-26 (×3): 1 mg via ORAL
  Filled 2012-11-25 (×3): qty 1

## 2012-11-25 MED ORDER — LAMOTRIGINE 25 MG PO TABS
25.0000 mg | ORAL_TABLET | Freq: Every day | ORAL | Status: DC
  Administered 2012-11-25 – 2012-11-26 (×2): 25 mg via ORAL
  Filled 2012-11-25 (×2): qty 1

## 2012-11-25 NOTE — ED Notes (Signed)
Dr taylor into see 

## 2012-11-25 NOTE — ED Notes (Signed)
Up to the desk on the phone 

## 2012-11-25 NOTE — ED Notes (Signed)
Patient complains of increase anxiety and request 1 mg ativan. Patient will be medicated per MAR.

## 2012-11-25 NOTE — ED Notes (Signed)
Up to the bathroom 

## 2012-11-25 NOTE — ED Notes (Signed)
Pt reports being at [facility] and being started on [place] injections and received two injections with the last one being done two weeks ago. He believes he's had EPS side effects from injection and requested Cogentin on arrival. He says he lost his sex drive, stiffness, drooling and agitation. He reports being on an ACT team now but doesn't know their name but says they know he's here because he's talked to them while here this visit. He reports binge drinking 2-3 six packs of beer 2-3 days a week. He denies any w/d sxs at this time. He has 3 previous suicide attempts and multiple psychiatric hospitalizations. He still says he's +SI but contracts for safety in hospital "no way i can hurt self here". He endorses +ACH telling him to hurt himself and degrading him and he reports seeing the spirits a couple times since he's been here. He says he copes with these incidents by praying.

## 2012-11-25 NOTE — ED Notes (Signed)
Chaplin in w/ pt 

## 2012-11-25 NOTE — BHH Counselor (Signed)
Contacted [facility] to verify that fax was received. Spoke to Alden and she verified that Drema Pry was reviewing information at this time.

## 2012-11-25 NOTE — ED Notes (Signed)
Dr Taylor into see 

## 2012-11-25 NOTE — ED Notes (Signed)
Up tot he bathroom to shower and change scrubs 

## 2012-11-25 NOTE — ED Notes (Signed)
Medications reveiwed by N. Mashburn NP-TORB-continue ability/dc tegretal

## 2012-11-25 NOTE — ED Notes (Signed)
visteril not given-pt has been sleeping most of the day.

## 2012-11-25 NOTE — BHH Counselor (Signed)
Patient evaluated by psychiatrist Dr. Ladona Ridgel and contracted for safety if he was able to be placed in facility. Per Dr. Ladona Ridgel patient agreed to a residential treatment program such as [facility].  Pt referred and accepted to [facility].He was accepted and transport will be here to pick him up approximately 7:30pm. Writer spoke to patient about his new disposition plan. Patient asked, "How long will I be at [facility]". Writer informed patient that he would first go into the detox program (3) days. Patient sts, "That's it!!". Pt then told that he would have a option to transfer into their residential program (14 days) as well and patient again stated, "That's all the time they would keep me". Patient later sts, "I would rather go to [facility]". Writer tried to encourage patient to try [facility] as this program would provide him with support, coping skills, SA treatment, etc. Patient again stated that he only wanted treatment at [facility]. Pt also stating, "I am feeling suicidal again and need [facility]".   Writer informed patient's nurse-Janie and nursing director-Nancy of the above information.

## 2012-11-25 NOTE — ED Notes (Signed)
Resting quietly, nad.

## 2012-11-25 NOTE — ED Notes (Signed)
Chaplain rounding on pt d/t repeat admission.  Familiar with pt from [facility] ED and [facility].    Pt spoke with chaplain about feelings of embarasment and loneliness.  Requested prayers and shared feelings of insecurity and un-worthiness.  Chaplain prayed with pt, provided follow-up emotional and spiritual support.  Belva Crome  MDiv

## 2012-11-25 NOTE — BHH Counselor (Signed)
Pt referred to [facility]; pending review. Drema Pry verified that residential beds are available.

## 2012-11-26 DIAGNOSIS — F22 Delusional disorders: Secondary | ICD-10-CM

## 2012-11-26 DIAGNOSIS — R45850 Homicidal ideations: Secondary | ICD-10-CM

## 2012-11-26 DIAGNOSIS — F29 Unspecified psychosis not due to a substance or known physiological condition: Secondary | ICD-10-CM

## 2012-11-26 MED ORDER — VALACYCLOVIR HCL 1 G PO TABS: 1000.0000 mg | ORAL_TABLET | Freq: Two times a day (BID) | ORAL | Status: DC

## 2012-11-26 MED ORDER — HYDROXYZINE HCL 25 MG PO TABS: 25.0000 mg | ORAL_TABLET | Freq: Three times a day (TID) | ORAL | Status: DC | PRN

## 2012-11-26 NOTE — Consult Note (Signed)
Reason for Consult:Eval for IP psychiatric mgmt Referring Physician:Wl EDP  [name] is an 52 y.o. male.  HPI: Pt is a 53 y/o AAM well known to [name] with hx of bipolar d/o with multiple manic exacerbations secondary to medical non-compliance and polysubstance abuse. Patient is presenting voluntary to [facility] ED with concerns with active SI with an (SA) lying on the street for traffic to hit him. Patient states that he is also having thoughts of wanting [employer] to shoot him. Patient also endorses non command AVH but denies any HI at this present time. Patient cannot contract for safety. And endorses being non compliant with his psychotropics along with continued use of cocaine.  Past Medical History  Diagnosis Date  . Depression   . Arthritis   . Bipolar 1 disorder   . PTSD (post-traumatic stress disorder)   . Schizophrenia     History reviewed. No pertinent past surgical history.  No family history on file.  Social History:  reports that he has quit smoking. He does not have any smokeless tobacco history on file. He reports that he drinks about 10.8 ounces of alcohol per week. He reports that he uses illicit drugs (Cocaine).  Allergies:  Allergies  Allergen Reactions  . Penicillins Nausea And Vomiting    Medications: I have reviewed the patient's current medications.  No results found for this or any previous visit (from the past 48 hour(s)).  No results found.  Review of Systems  Psychiatric/Behavioral: Positive for depression, suicidal ideas, hallucinations and substance abuse. Negative for memory loss. The patient is not nervous/anxious and does not have insomnia.   All other systems reviewed and are negative.   Blood pressure 97/58, pulse 81, temperature 98.1 F (36.7 C), temperature source Oral, resp. rate 18, SpO2 95.00%. Physical Exam  Constitutional: He is oriented to person, place, and time. He appears well-developed and well-nourished.  HENT:  Head: Normocephalic.   Eyes: Pupils are equal, round, and reactive to light.  Neck: Normal range of motion. Neck supple.  Cardiovascular: Normal rate and regular rhythm.   Respiratory: Effort normal and breath sounds normal.  Neurological: He is alert and oriented to person, place, and time. No cranial nerve deficit.  Skin: Skin is warm and dry.  Psychiatric:  Good eye contact, flat affect with pressured speech, limited insight and plethora of ideas    Assessment/Plan: 1) Patient has exhausted all psychiatric resources at [facility] recommend long -term psychiatric IP placement at a state facility. 11/25/2012  [name] says his mother will not take him home until he gets help with his drug use.  Says he has been drinking and using cocaine.  He has been in detox more than once.  He has been saying he has suicidal thoughts but now is willing to contract for safety.  I am recommending detox whenever a bed is available.  11/26/2012 Follow up evaluation for inpatient treatment Patient states that he only refused bed at [facility] because he did not understand that it was a long term treatment program.  Patient states "I know if I tell you that I am suicidal you can't discharge me."  Patient states that he was just recently discharge from a facility in [place] and medication was forced on him and the side effects of the medication is what caused him to feel depressed and SI thoughts.  Patient is walking around unit and interacting with staff.  Patient does not appear to be depress and is cooperative.  Discussed drug rehab  with patient on a outpatient basis and also explained to patient that the outpatient services that has been put in place for him needs to be followed.  Explained to patient that he needs to consult ACT and [name] first before coming to hospital.  Patient also contacted [facility] and spoke with a staff member who he states accepted him and told him he could be admitted today.  Patient state he inquired about number to nurse Minerva Areola and  was given the number.    Recommendation:  Monitor patient over the weekend while on medications.  May discharge on Monday If bed becomes available at [facility] or any other residential facility with long term treatment programs patient may be discharge to treatment program.     Assunta Found, FNP-BC 11/26/2012, 12:21 PM    I personally seen the patient agreed with the findings and involved in the treatment plan.

## 2012-11-26 NOTE — BHH Counselor (Signed)
Writer met with patient to ask if he is suicidal and also to contract for safety. Patient sts, "Well it comes and goes". Writer again asked patient and also informed him that this question about his suicidal thoughts is very important and specific. Asked patient to provide a "yes or no answer" answer. Patient sts, "It's depends on my options". Patient says he is unable to provide a answer about his sucidality unless he knows his follow options. He says, "If I say NO you guys will throw me on the street". He then says, "If I say YES you will have to keep me". Patient will not provide a clear response as to whether or not suicidal.

## 2012-11-26 NOTE — Accreditation Note (Signed)
Patient stopped this Clinical research associate in the hall stating, "I have [facility] on the phone and they will take me". Writer reminded patient again that due to his suicidal ideations this is not an appropriate program.

## 2012-11-26 NOTE — BHH Counselor (Signed)
Dr. Lolly Mustache recommended that patient remain in the hospital over the weekend, start medications, and monitor medications. Dr. Lolly Mustache sts that patient will be re-evaluated for discharge with outpatient follow up on Monday 11/29/2012.

## 2012-11-26 NOTE — BHH Counselor (Signed)
Patient seen by providers-Dr. Lolly Mustache (psychiatrist) and Assunta Found, NP.

## 2012-11-26 NOTE — Progress Notes (Signed)
[name] [id number]  1961/05/19 [id number]  After speaking with Suzi Roots and his mother.  Patient states that he is not suicidal.  Patient states that he does want to get into a drug rehab program.  States that he would like to go to [facility] and misunderstood yesterday and today wanting to get more information and beg to call again today to see if he would be accepted.  Patient mother in agreement that patient needs drug rehab services.  At this time patient denies suicidal ideation, homicidal ideations, psychosis, and paranoia.     Sheriece Jefcoat B. Mellody Masri FNP-BC Family Nurse Practitioner, Board Certified  11/26/2012   1:26 PM

## 2012-11-26 NOTE — ED Notes (Signed)
Pt discharged, picked up by [facility] rep, Vistaril and Valtrex medications given to [facility] rep.

## 2012-11-26 NOTE — BHH Counselor (Signed)
Patient approached this Clinical research associate asking where he was going. Sts he wants to know if he is still going to [facility]. Writer informed patient that after his evaluation with a provider-Shuvon Rankin, NP details of his disposition would be discussed.

## 2012-11-26 NOTE — BHH Counselor (Signed)
Writer discussed with Perry Found, NP that [name] is inquiring about [facility] and seems to be again interested. Patient has contacted [facility] himself and inquired about the program. Patient has asked the person on the phone if he is eligible for this program and how long they will keep him. Writer will meet with patient to see if he is able sign a contract for safety form. If patient signs this form Clinical research associate will pursue [facility]'s residential program again.

## 2012-11-26 NOTE — BHH Counselor (Signed)
Writer was stopped by patient in the hall. He sts, "I spoke to [employer], NP and she informed me that the [facility] will actually keep me for more than 3 days". Pt says, "I didn't understand I could go straight into rehab for 14 days."  Patient sts that he did not understand the process. Sts that long as he can go into the 14 residential program following the 3 days of detox he wants to go to [facility] now. Writer reminded patient as he was informed yesterday that with suicidal thoughts, plans, intent, etc. [facility] is not appropriate as this is not a locked facility. Patient told that he must only go into a locked facility due to safety reasons.

## 2012-11-28 NOTE — Progress Notes (Signed)
I agreed with the findings and involved in the treatment plan.

## 2013-01-18 ENCOUNTER — Emergency Department (HOSPITAL_COMMUNITY)
Admission: EM | Admit: 2013-01-18 | Discharge: 2013-01-18 | Disposition: A | Discharge status: Home / Self Care | Payer: Medicaid Other | Attending: Emergency Medicine | Admitting: Emergency Medicine

## 2013-01-18 ENCOUNTER — Encounter (HOSPITAL_COMMUNITY): Payer: Self-pay | Admitting: *Deleted

## 2013-01-18 DIAGNOSIS — F209 Schizophrenia, unspecified: Secondary | ICD-10-CM | POA: Insufficient documentation

## 2013-01-18 DIAGNOSIS — L539 Erythematous condition, unspecified: Secondary | ICD-10-CM | POA: Insufficient documentation

## 2013-01-18 DIAGNOSIS — Z880 Allergy status to penicillin: Secondary | ICD-10-CM | POA: Insufficient documentation

## 2013-01-18 DIAGNOSIS — Z8739 Personal history of other diseases of the musculoskeletal system and connective tissue: Secondary | ICD-10-CM | POA: Insufficient documentation

## 2013-01-18 DIAGNOSIS — F319 Bipolar disorder, unspecified: Secondary | ICD-10-CM | POA: Insufficient documentation

## 2013-01-18 DIAGNOSIS — F4310 Post-traumatic stress disorder, unspecified: Secondary | ICD-10-CM | POA: Insufficient documentation

## 2013-01-18 DIAGNOSIS — Z79899 Other long term (current) drug therapy: Secondary | ICD-10-CM | POA: Insufficient documentation

## 2013-01-18 DIAGNOSIS — Z87891 Personal history of nicotine dependence: Secondary | ICD-10-CM | POA: Insufficient documentation

## 2013-01-18 DIAGNOSIS — R369 Urethral discharge, unspecified: Secondary | ICD-10-CM

## 2013-01-18 DIAGNOSIS — N390 Urinary tract infection, site not specified: Secondary | ICD-10-CM | POA: Insufficient documentation

## 2013-01-18 DIAGNOSIS — N489 Disorder of penis, unspecified: Secondary | ICD-10-CM | POA: Insufficient documentation

## 2013-01-18 LAB — URINE MICROSCOPIC-ADD ON

## 2013-01-18 LAB — URINALYSIS, ROUTINE W REFLEX MICROSCOPIC
Bilirubin Urine: NEGATIVE
Glucose, UA: NEGATIVE mg/dL
Hgb urine dipstick: NEGATIVE
Ketones, ur: NEGATIVE mg/dL
Nitrite: NEGATIVE
Protein, ur: NEGATIVE mg/dL
Specific Gravity, Urine: 1.014 (ref 1.005–1.030)
Urobilinogen, UA: 1 mg/dL (ref 0.0–1.0)
pH: 6.5 (ref 5.0–8.0)

## 2013-01-18 MED ORDER — LIDOCAINE HCL (PF) 1 % IJ SOLN
INTRAMUSCULAR | Status: AC
  Administered 2013-01-18: 5 mL
  Filled 2013-01-18: qty 5

## 2013-01-18 MED ORDER — CEPHALEXIN 500 MG PO CAPS: 500.0000 mg | ORAL_CAPSULE | Freq: Four times a day (QID) | ORAL | Status: DC

## 2013-01-18 MED ORDER — AZITHROMYCIN 250 MG PO TABS
1000.0000 mg | ORAL_TABLET | Freq: Once | ORAL | Status: AC
  Administered 2013-01-18: 1000 mg via ORAL
  Filled 2013-01-18: qty 4

## 2013-01-18 MED ORDER — CEFTRIAXONE SODIUM 250 MG IJ SOLR
250.0000 mg | Freq: Once | INTRAMUSCULAR | Status: AC
  Administered 2013-01-18: 250 mg via INTRAMUSCULAR
  Filled 2013-01-18: qty 250

## 2013-01-18 NOTE — ED Notes (Signed)
Verbal order by pa- to tube specimen.  (patient was not mine)

## 2013-01-18 NOTE — ED Notes (Signed)
Pt is here with blood in urine for about 2-3 days.  No pain anywhere

## 2013-01-18 NOTE — ED Provider Notes (Signed)
CSN: [id number]     Arrival date & time 01/18/13  1128 History   First MD Initiated Contact with Patient 01/18/13 1420     Chief Complaint  Patient presents with  . Hematuria   (Consider location/radiation/quality/duration/timing/severity/associated sxs/prior Treatment) HPI Comments: 52 year old male presents to the emergency department complaining of hematuria over the past 3 days. Patient states he has noticed that his penis is a little red and irritated with associated white mixed with red penile discharge. Denies dysuria, increased urinary frequency or urgency, testicular pain or swelling. Denies abdominal pain, nausea or vomiting. Admits to being sexually active with condom usage. Back in March he was diagnosed with balanitis and was treated with Keflex and Valtrex.  Patient is a 52 y.o. male presenting with hematuria. The history is provided by the patient.  Hematuria Pertinent negatives include no abdominal pain, nausea or vomiting.    Past Medical History  Diagnosis Date  . Depression   . Arthritis   . Bipolar 1 disorder   . PTSD (post-traumatic stress disorder)   . Schizophrenia    History reviewed. No pertinent past surgical history. History reviewed. No pertinent family history. History  Substance Use Topics  . Smoking status: Former Smoker -- 0.00 packs/day  . Smokeless tobacco: Not on file  . Alcohol Use: 10.8 oz/week    18 Cans of beer per week     Comment: Clean for 3 days    Review of Systems  Gastrointestinal: Negative for nausea, vomiting and abdominal pain.  Genitourinary: Positive for hematuria, discharge and penile pain. Negative for urgency, frequency, flank pain, scrotal swelling and testicular pain.  Musculoskeletal: Negative for back pain.  All other systems reviewed and are negative.    Allergies  Penicillins  Home Medications   Current Outpatient Rx  Name  Route  Sig  Dispense  Refill  . hydrOXYzine (ATARAX/VISTARIL) 25 MG tablet   Oral  Take 1-2 tablets (25-50 mg total) by mouth every 8 (eight) hours as needed.   30 tablet   0   . lamoTRIgine (LAMICTAL) 25 MG tablet   Oral   Take 25 mg by mouth 2 (two) times daily.         Marland Kitchen LORazepam (ATIVAN) 0.5 MG tablet   Oral   Take 0.5 mg by mouth every 8 (eight) hours as needed for anxiety.         . valACYclovir (VALTREX) 1000 MG tablet   Oral   Take 1 tablet (1,000 mg total) by mouth 2 (two) times daily.   60 tablet   0    BP 124/69  Pulse 86  Temp(Src) 97.8 F (36.6 C) (Oral)  Resp 18  SpO2 100% Physical Exam  Nursing note and vitals reviewed. Constitutional: He is oriented to person, place, and time. He appears well-developed and well-nourished. No distress.  HENT:  Head: Normocephalic and atraumatic.  Mouth/Throat: Oropharynx is clear and moist.  Eyes: Conjunctivae are normal.  Neck: Normal range of motion. Neck supple.  Cardiovascular: Normal rate, regular rhythm and normal heart sounds.   Pulmonary/Chest: Effort normal and breath sounds normal.  Abdominal: Soft. Bowel sounds are normal. He exhibits no distension. There is no tenderness. There is no CVA tenderness.  Genitourinary: Testes normal. Right testis shows no mass, no swelling and no tenderness. Left testis shows no mass, no swelling and no tenderness. Uncircumcised. Penile erythema present. No phimosis, paraphimosis or penile tenderness. Discharge (white/pink) found.  Musculoskeletal: Normal range of motion. He exhibits no edema.  Neurological: He is alert and oriented to person, place, and time.  Skin: Skin is warm and dry. He is not diaphoretic.  Psychiatric: He has a normal mood and affect. His behavior is normal.    ED Course  Procedures (including critical care time) Labs Review Labs Reviewed  URINALYSIS, ROUTINE W REFLEX MICROSCOPIC - Abnormal; Notable for the following:    APPearance HAZY (*)    Leukocytes, UA LARGE (*)    All other components within normal limits  URINE  MICROSCOPIC-ADD ON   Imaging Review No results found.  MDM   1. UTI (urinary tract infection)   2. Penile discharge    Patient with urinary tract infection along with penile discharge. Cultures for GC and Chlamydia obtained. After discussing potential STD with patient when discharge noted he became concerned. 250 mg IM Rocephin along with 1 g PO azithromycin given. Discharge with Keflex for UTI. Infection care and precautions discussed. Return precautions discussed. Patient states understanding of plan and is agreeable.   Trevor Mace, PA-C 01/18/13 1457

## 2013-01-20 LAB — GC/CHLAMYDIA PROBE AMP: CT Probe RNA: NEGATIVE

## 2013-01-20 NOTE — ED Provider Notes (Signed)
Medical screening examination/treatment/procedure(s) were performed by non-physician practitioner and as supervising physician I was immediately available for consultation/collaboration.  Jelina Paulsen, MD 01/20/13 0848 

## 2013-02-26 ENCOUNTER — Encounter (HOSPITAL_COMMUNITY): Payer: Self-pay | Admitting: Physical Medicine and Rehabilitation

## 2013-02-26 ENCOUNTER — Emergency Department (HOSPITAL_COMMUNITY)
Admission: EM | Admit: 2013-02-26 | Discharge: 2013-02-26 | Disposition: A | Discharge status: Home / Self Care | Payer: Medicaid Other | Attending: Emergency Medicine | Admitting: Emergency Medicine

## 2013-02-26 DIAGNOSIS — N481 Balanitis: Secondary | ICD-10-CM

## 2013-02-26 DIAGNOSIS — F209 Schizophrenia, unspecified: Secondary | ICD-10-CM | POA: Insufficient documentation

## 2013-02-26 DIAGNOSIS — M129 Arthropathy, unspecified: Secondary | ICD-10-CM | POA: Insufficient documentation

## 2013-02-26 DIAGNOSIS — R82998 Other abnormal findings in urine: Secondary | ICD-10-CM | POA: Insufficient documentation

## 2013-02-26 DIAGNOSIS — Z87891 Personal history of nicotine dependence: Secondary | ICD-10-CM | POA: Insufficient documentation

## 2013-02-26 DIAGNOSIS — Z79899 Other long term (current) drug therapy: Secondary | ICD-10-CM | POA: Insufficient documentation

## 2013-02-26 DIAGNOSIS — Z791 Long term (current) use of non-steroidal anti-inflammatories (NSAID): Secondary | ICD-10-CM | POA: Insufficient documentation

## 2013-02-26 DIAGNOSIS — F319 Bipolar disorder, unspecified: Secondary | ICD-10-CM | POA: Insufficient documentation

## 2013-02-26 DIAGNOSIS — R8281 Pyuria: Secondary | ICD-10-CM

## 2013-02-26 DIAGNOSIS — N476 Balanoposthitis: Secondary | ICD-10-CM | POA: Insufficient documentation

## 2013-02-26 DIAGNOSIS — Z202 Contact with and (suspected) exposure to infections with a predominantly sexual mode of transmission: Secondary | ICD-10-CM

## 2013-02-26 DIAGNOSIS — N342 Other urethritis: Secondary | ICD-10-CM

## 2013-02-26 LAB — URINE MICROSCOPIC-ADD ON

## 2013-02-26 LAB — URINALYSIS, ROUTINE W REFLEX MICROSCOPIC
Bilirubin Urine: NEGATIVE
Glucose, UA: NEGATIVE mg/dL
Hgb urine dipstick: NEGATIVE
Ketones, ur: NEGATIVE mg/dL
Protein, ur: NEGATIVE mg/dL
Urobilinogen, UA: 1 mg/dL (ref 0.0–1.0)

## 2013-02-26 LAB — WET PREP, GENITAL

## 2013-02-26 MED ORDER — CEFTRIAXONE SODIUM 1 G IJ SOLR
1.0000 g | Freq: Once | INTRAMUSCULAR | Status: AC
  Administered 2013-02-26: 1 g via INTRAMUSCULAR
  Filled 2013-02-26: qty 10

## 2013-02-26 MED ORDER — DOXYCYCLINE HYCLATE 100 MG PO CAPS: 100.0000 mg | ORAL_CAPSULE | Freq: Two times a day (BID) | ORAL | Status: DC

## 2013-02-26 MED ORDER — LIDOCAINE HCL (PF) 1 % IJ SOLN
2.0000 mL | Freq: Once | INTRAMUSCULAR | Status: AC
  Administered 2013-02-26: 2 mL

## 2013-02-26 MED ORDER — LIDOCAINE HCL (PF) 1 % IJ SOLN
INTRAMUSCULAR | Status: AC
  Administered 2013-02-26: 2 mL
  Filled 2013-02-26: qty 5

## 2013-02-26 NOTE — ED Notes (Signed)
Pt presents to department for evaluation of penile bleeding. Ongoing for several months intermittently. Denies penile discharge. 2/10 pain at the time. States he was placed on antibiotics last time this occurred. Denies urinary symptoms. Pt is alert and oriented x4. NAD.

## 2013-02-26 NOTE — ED Notes (Signed)
This RN at bedside with EDPA for Penile exam/wet prep collection performed by EDPA. Pt tolerated well.

## 2013-02-26 NOTE — ED Notes (Signed)
Pt comfortable with d/c and f/u instructions. Prescriptions x1 

## 2013-02-26 NOTE — ED Provider Notes (Signed)
CSN: [id number]     Arrival date & time 02/26/13  1429 History   First MD Initiated Contact with Patient 02/26/13 1511     Chief Complaint  Patient presents with  . Penis Pain   (Consider location/radiation/quality/duration/timing/severity/associated sxs/prior Treatment) The history is provided by the patient. No language interpreter was used.  Denali Becvar is a 52 y/o M with PMHx of depression, anxiety, Bipolar I disorder, PTSD, schizophrenia presenting to the ED with hematuria that has been ongoing for the past 3 months. Patient reported that he has noticed these sores on the foreskin of his penis, stated that whenever he masturbates he has been having this discomfort - reported that he only notices blood after masturbation. Stated that when he notices bleeding to the foreskin when he itches the lesions, reported that the itching occurs intermittently throughout the day. Patient reported that he notices blood in the urine only after masturbation which triggers bleeding of these lesions of the foreskin. Patient reported that this is his third visit to the ED regarding this issue, patient reported that he was diagnosed with balanitis and phimosis - patient was treated with 3 courses of keflex and valacyclovir. Patient stated that the valacyclovir has been helping with his itching. Patient denied having any sexual interactions for the past 3 months, because he was told not to. Patient reported that he was referred to Dr. Brunilda Payor, stated that he did not follow-up with this physician. Patient reported that he was given shots for STDs. Denied dysuria, hematuria, discharge, penile pain, testicular pain, fever, chills, nausea, vomiting, diarrhea, melena, hematochezia, abdominal pain, testicular swelling. PCP none   Past Medical History  Diagnosis Date  . Depression   . Arthritis   . Bipolar 1 disorder   . PTSD (post-traumatic stress disorder)   . Schizophrenia    History reviewed. No pertinent past  surgical history. History reviewed. No pertinent family history. History  Substance Use Topics  . Smoking status: Former Smoker -- 0.00 packs/day  . Smokeless tobacco: Not on file  . Alcohol Use: No    Review of Systems  Constitutional: Negative for fever and chills.  HENT: Negative for trouble swallowing.   Gastrointestinal: Negative for nausea, vomiting, abdominal pain, diarrhea, constipation, blood in stool and anal bleeding.  Genitourinary: Positive for genital sores. Negative for dysuria, hematuria, decreased urine volume, discharge, penile swelling, scrotal swelling, difficulty urinating, penile pain and testicular pain.  Neurological: Negative for weakness and headaches.  All other systems reviewed and are negative.    Allergies  Review of patient's allergies indicates no known allergies.  Home Medications   Current Outpatient Rx  Name  Route  Sig  Dispense  Refill  . hydrOXYzine (ATARAX/VISTARIL) 25 MG tablet   Oral   Take 1-2 tablets (25-50 mg total) by mouth every 8 (eight) hours as needed.   30 tablet   0   . ibuprofen (ADVIL,MOTRIN) 200 MG tablet   Oral   Take 400 mg by mouth 2 (two) times daily.         Marland Kitchen lamoTRIgine (LAMICTAL) 25 MG tablet   Oral   Take 25 mg by mouth 2 (two) times daily.         Marland Kitchen LORazepam (ATIVAN) 0.5 MG tablet   Oral   Take 0.5 mg by mouth every 8 (eight) hours as needed for anxiety.         . valACYclovir (VALTREX) 1000 MG tablet   Oral   Take 1 tablet (1,000 mg total)  by mouth 2 (two) times daily.   60 tablet   0   . doxycycline (VIBRAMYCIN) 100 MG capsule   Oral   Take 1 capsule (100 mg total) by mouth 2 (two) times daily. One po bid x 7 days   14 capsule   0    BP 141/93  Pulse 80  Temp(Src) 98.1 F (36.7 C) (Oral)  Resp 18  Wt 256 lb (116.121 kg)  BMI 32 kg/m2  SpO2 98% Physical Exam  Constitutional: He is oriented to person, place, and time. He appears well-developed and well-nourished. No distress.   HENT:  Head: Normocephalic and atraumatic.  Mouth/Throat: Oropharynx is clear and moist. No oropharyngeal exudate.  Negative oral lesions or sores noted  Eyes: Conjunctivae and EOM are normal. Pupils are equal, round, and reactive to light. Right eye exhibits no discharge. Left eye exhibits no discharge.  Neck: Normal range of motion. Neck supple.  Cardiovascular: Normal rate, regular rhythm and normal heart sounds.  Exam reveals no friction rub.   No murmur heard. Pulses:      Radial pulses are 2+ on the right side, and 2+ on the left side.       Dorsalis pedis pulses are 2+ on the right side, and 2+ on the left side.  Pulmonary/Chest: Effort normal and breath sounds normal. No respiratory distress. He has no wheezes. He has no rales.  Abdominal: Soft. Bowel sounds are normal. He exhibits no distension. There is no tenderness. There is no rebound.  Genitourinary: No penile tenderness.  Sores with dryness noted to the foreskin. Negative smega noted. Negative pain upon palpation to the foreskin or the penis. Negative sores or lesions noted to the glans penis. Negative active bleeding noted. Negative active discharge noted. Negative lesions noted to the foreskin. Negative testicular swelling, inflammation, pain. Negative inguinal lymphadenopathy.  Exam chaperoned with RN Andreas Ohm  Musculoskeletal: Normal range of motion.  Neurological: He is alert and oriented to person, place, and time. He exhibits normal muscle tone. Coordination normal.  Skin: Skin is warm and dry. No rash noted. He is not diaphoretic. No erythema.  Psychiatric: He has a normal mood and affect. His behavior is normal. Thought content normal.    ED Course  Procedures (including critical care time)  Discussed case and reviewed labs with Dr. Jinger Neighbors - recommended that patient be given one round of Rocephin in ED setting. Dr. Jinger Neighbors recommended patient to be discharged with Doxycycline and for vasoline to be placed  on the lesions.   5:11PM Spoke with patient regarding lab findings. Had a long discussion with patient regarding urethritis and infection. Discussed the importance of following up as an outpatient. Patient understood and agreed to plan. All questions answered   Labs Review Labs Reviewed  WET PREP, GENITAL - Abnormal; Notable for the following:    WBC, Wet Prep HPF POC FEW (*)    All other components within normal limits  URINALYSIS, ROUTINE W REFLEX MICROSCOPIC - Abnormal; Notable for the following:    Leukocytes, UA MODERATE (*)    All other components within normal limits  URINE MICROSCOPIC-ADD ON   Imaging Review No results found.  MDM   1. Balanitis   2. Pyuria   3. Possible exposure to STD   4. Urethritis     Patient presenting to the ED with sores to the foreskin that have been ongoing for the past couple of weeks, patient was seen in the ED at least 3 times  regarding the same issue. Patient was treated with 3 rounds of Keflex and Valacyclovir. Patient reported that the valacyclovir aids in the itching - patient requesting medication to be refilled. Denied discharge, penile pain, hematuria, fever, chills, abdominal pain, nausea, vomiting, testicular pain or swelling.  Alert and oriented. Heart rate and rhythm normal. Lungs clear to auscultation bilaterally. Pulses palpable and strong, distal and proximal. Sores with dryness around the lesions noted to the foreskin - negative active bleeding noted. Negative pain upon palpation to the site. No raised lesions identified. Negative penile/testicular/scrotal lesions, sores, inflammation, swelling, deformities noted. Negative discharge identified.  Urine noted moderate leukocytes with negative nitrites present, elevated WBC of 21-50 noted. Pyuria without bacteria identified. Wet prep with few WBC noted.  Treated patient with Rocephin in ED setting. Suspicion to be possible urethritis with balanitis. Possible exposure to STD. Patient not a  diabetic - doubt a yeast infection. Patient stable, afebrile. Discharged patient with doxycycline. Discussed with patient to keep site clean and to apply vasoline/bacitracin to the site of sores on the foreskin. Discussed with the patient to follow-up with urology and STD clinic. Discussed with patient to continue to monitor symptoms and if symptoms are to worsen or change to report back to the ED - educated patient on what symptoms to watch out for - strict return instructions given. Patient agreed to plan of care, understood, all questions answered.   Raymon Mutton, PA-C 02/27/13 [phone]

## 2013-02-27 NOTE — ED Provider Notes (Signed)
Medical screening examination/treatment/procedure(s) were performed by non-physician practitioner and as supervising physician I was immediately available for consultation/collaboration.   Kazuko Clemence M Armanii Pressnell, MD 02/27/13 1239 

## 2013-02-28 LAB — GC/CHLAMYDIA PROBE AMP
CT Probe RNA: NEGATIVE
GC Probe RNA: NEGATIVE

## 2013-03-03 ENCOUNTER — Telehealth (HOSPITAL_COMMUNITY): Payer: Self-pay | Admitting: Emergency Medicine

## 2013-06-01 ENCOUNTER — Emergency Department (HOSPITAL_COMMUNITY)
Admission: EM | Admit: 2013-06-01 | Discharge: 2013-06-13 | Disposition: A | Discharge status: Home / Self Care | Payer: Medicaid Other | Attending: Emergency Medicine | Admitting: Emergency Medicine

## 2013-06-01 ENCOUNTER — Encounter (HOSPITAL_COMMUNITY): Payer: Self-pay | Admitting: Emergency Medicine

## 2013-06-01 DIAGNOSIS — F209 Schizophrenia, unspecified: Secondary | ICD-10-CM | POA: Insufficient documentation

## 2013-06-01 DIAGNOSIS — F1410 Cocaine abuse, uncomplicated: Secondary | ICD-10-CM

## 2013-06-01 DIAGNOSIS — F29 Unspecified psychosis not due to a substance or known physiological condition: Secondary | ICD-10-CM

## 2013-06-01 DIAGNOSIS — F315 Bipolar disorder, current episode depressed, severe, with psychotic features: Secondary | ICD-10-CM

## 2013-06-01 DIAGNOSIS — F329 Major depressive disorder, single episode, unspecified: Secondary | ICD-10-CM | POA: Insufficient documentation

## 2013-06-01 DIAGNOSIS — F4310 Post-traumatic stress disorder, unspecified: Secondary | ICD-10-CM | POA: Insufficient documentation

## 2013-06-01 DIAGNOSIS — F1010 Alcohol abuse, uncomplicated: Secondary | ICD-10-CM

## 2013-06-01 DIAGNOSIS — F319 Bipolar disorder, unspecified: Secondary | ICD-10-CM | POA: Insufficient documentation

## 2013-06-01 DIAGNOSIS — F312 Bipolar disorder, current episode manic severe with psychotic features: Secondary | ICD-10-CM

## 2013-06-01 DIAGNOSIS — F1910 Other psychoactive substance abuse, uncomplicated: Secondary | ICD-10-CM | POA: Insufficient documentation

## 2013-06-01 DIAGNOSIS — F17200 Nicotine dependence, unspecified, uncomplicated: Secondary | ICD-10-CM | POA: Insufficient documentation

## 2013-06-01 DIAGNOSIS — R45851 Suicidal ideations: Secondary | ICD-10-CM

## 2013-06-01 DIAGNOSIS — F3289 Other specified depressive episodes: Secondary | ICD-10-CM | POA: Insufficient documentation

## 2013-06-01 DIAGNOSIS — F32A Depression, unspecified: Secondary | ICD-10-CM

## 2013-06-01 LAB — ETHANOL: Alcohol, Ethyl (B): <11 mg/dL (ref 0–11)

## 2013-06-01 LAB — RAPID URINE DRUG SCREEN, HOSP PERFORMED
Amphetamines: NOT DETECTED
Barbiturates: NOT DETECTED
Benzodiazepines: NOT DETECTED
Cocaine: POSITIVE — AB
Opiates: NOT DETECTED
Tetrahydrocannabinol: NOT DETECTED

## 2013-06-01 LAB — COMPREHENSIVE METABOLIC PANEL
ALT: 62 U/L — ABNORMAL HIGH (ref 0–53)
AST: 43 U/L — ABNORMAL HIGH (ref 0–37)
Albumin: 3.8 g/dL (ref 3.5–5.2)
Alkaline Phosphatase: 85 U/L (ref 39–117)
BUN: 18 mg/dL (ref 6–23)
CHLORIDE: 102 meq/L (ref 96–112)
CO2: 27 mEq/L (ref 19–32)
CREATININE: 1.01 mg/dL (ref 0.50–1.35)
Calcium: 9.1 mg/dL (ref 8.4–10.5)
GFR calc non Af Amer: 84 mL/min — ABNORMAL LOW (ref >90)
GLUCOSE: 118 mg/dL — AB (ref 70–99)
Potassium: 4.3 mEq/L (ref 3.7–5.3)
Sodium: 142 mEq/L (ref 137–147)
Total Bilirubin: 0.6 mg/dL (ref 0.3–1.2)
Total Protein: 7.5 g/dL (ref 6.0–8.3)

## 2013-06-01 LAB — CBC
HEMATOCRIT: 44.3 % (ref 39.0–52.0)
Hemoglobin: 14.8 g/dL (ref 13.0–17.0)
MCH: 29.5 pg (ref 26.0–34.0)
MCHC: 33.4 g/dL (ref 30.0–36.0)
MCV: 88.2 fL (ref 78.0–100.0)
Platelets: 227 10*3/uL (ref 150–400)
RBC: 5.02 MIL/uL (ref 4.22–5.81)
RDW: 12.8 % (ref 11.5–15.5)
WBC: 4.5 10*3/uL (ref 4.0–10.5)

## 2013-06-01 LAB — SALICYLATE LEVEL

## 2013-06-01 LAB — ACETAMINOPHEN LEVEL: Acetaminophen (Tylenol), Serum: <15 ug/mL (ref 10–30)

## 2013-06-01 MED ORDER — VITAMIN B-1 100 MG PO TABS
100.0000 mg | ORAL_TABLET | Freq: Every day | ORAL | Status: DC
  Administered 2013-06-01 – 2013-06-13 (×12): 100 mg via ORAL
  Filled 2013-06-01 (×12): qty 1

## 2013-06-01 MED ORDER — ADULT MULTIVITAMIN W/MINERALS CH
1.0000 | ORAL_TABLET | Freq: Every day | ORAL | Status: DC
  Administered 2013-06-01 – 2013-06-13 (×13): 1 via ORAL
  Filled 2013-06-01 (×13): qty 1

## 2013-06-01 MED ORDER — NICOTINE 21 MG/24HR TD PT24
21.0000 mg | MEDICATED_PATCH | Freq: Every day | TRANSDERMAL | Status: DC
  Filled 2013-06-01: qty 1

## 2013-06-01 MED ORDER — LORAZEPAM 1 MG PO TABS
0.0000 mg | ORAL_TABLET | Freq: Four times a day (QID) | ORAL | Status: AC
  Administered 2013-06-01: 2 mg via ORAL
  Administered 2013-06-01 – 2013-06-03 (×4): 1 mg via ORAL
  Filled 2013-06-01: qty 1
  Filled 2013-06-01: qty 2
  Filled 2013-06-01 (×4): qty 1

## 2013-06-01 MED ORDER — ACETAMINOPHEN 325 MG PO TABS: 650.0000 mg | ORAL_TABLET | ORAL | Status: DC | PRN

## 2013-06-01 MED ORDER — ONDANSETRON HCL 4 MG PO TABS: 4.0000 mg | ORAL_TABLET | Freq: Three times a day (TID) | ORAL | Status: DC | PRN

## 2013-06-01 MED ORDER — LORAZEPAM 1 MG PO TABS
0.0000 mg | ORAL_TABLET | Freq: Two times a day (BID) | ORAL | Status: AC
  Administered 2013-06-03 – 2013-06-04 (×4): 1 mg via ORAL
  Filled 2013-06-01 (×3): qty 1

## 2013-06-01 MED ORDER — IBUPROFEN 200 MG PO TABS
600.0000 mg | ORAL_TABLET | Freq: Three times a day (TID) | ORAL | Status: DC | PRN
Start: 2013-06-01 — End: 2013-06-13
  Administered 2013-06-04 – 2013-06-12 (×9): 600 mg via ORAL
  Filled 2013-06-01 (×9): qty 3

## 2013-06-01 MED ORDER — THIAMINE HCL 100 MG/ML IJ SOLN: 100.0000 mg | Freq: Every day | INTRAMUSCULAR | Status: DC

## 2013-06-01 MED ORDER — FOLIC ACID 1 MG PO TABS
1.0000 mg | ORAL_TABLET | Freq: Every day | ORAL | Status: DC
  Administered 2013-06-01 – 2013-06-13 (×13): 1 mg via ORAL
  Filled 2013-06-01 (×13): qty 1

## 2013-06-01 MED ORDER — HYDROXYZINE HCL 25 MG PO TABS
50.0000 mg | ORAL_TABLET | Freq: Three times a day (TID) | ORAL | Status: DC | PRN
  Administered 2013-06-01 – 2013-06-12 (×17): 50 mg via ORAL
  Filled 2013-06-01 (×20): qty 2

## 2013-06-01 MED ORDER — ZOLPIDEM TARTRATE 5 MG PO TABS
5.0000 mg | ORAL_TABLET | Freq: Every evening | ORAL | Status: DC | PRN
Start: 2013-06-01 — End: 2013-06-13
  Administered 2013-06-02 – 2013-06-12 (×11): 5 mg via ORAL
  Filled 2013-06-01 (×11): qty 1

## 2013-06-01 MED ORDER — ALUM & MAG HYDROXIDE-SIMETH 200-200-20 MG/5ML PO SUSP: 30.0000 mL | ORAL | Status: DC | PRN

## 2013-06-01 MED ORDER — LAMOTRIGINE 25 MG PO TABS
25.0000 mg | ORAL_TABLET | Freq: Two times a day (BID) | ORAL | Status: DC
  Administered 2013-06-01 – 2013-06-13 (×25): 25 mg via ORAL
  Filled 2013-06-01 (×28): qty 1

## 2013-06-01 NOTE — Progress Notes (Signed)
Patient endorses SI and passive AH. Denies HI, contracts for safety. Patient reports anxiety 7/10, and depression 7/10. States that he has been depressed for a long time and that his mother being upset with him and putting him out has not helped. Encouragement offered. Snack given. Patient safety maintained, Q 15 checks continue.
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
## 2013-06-01 NOTE — ED Notes (Signed)
[facility] called to transport pt to [facility] ed

## 2013-06-01 NOTE — Progress Notes (Signed)
B.Penne Rosenstock, MHT requested by Darrol Pokearoline Muralles, TTS to seek placement for patient who is in need of psychiatric care.  Writer contacted the following facilities listed below in efforts to secure placement;   [facility] [employer] [facility] [employer] left voice message to return call for bed availabilty [facility] faxed for review [facility] faxed for review [facility] no answer [facility]

## 2013-06-01 NOTE — ED Notes (Signed)
[facility] has arrived to transport pt. 

## 2013-06-01 NOTE — ED Provider Notes (Addendum)
CSN: [id number]     Arrival date & time 06/01/13  1001 History   First MD Initiated Contact with Patient 06/01/13 1129     Chief Complaint  Patient presents with  . Medical Clearance  . Suicidal   (Consider location/radiation/quality/duration/timing/severity/associated sxs/prior Treatment) HPI Comments: Pt comes in with cc of suicidal ideations. Hxo f depression. Schizophrenai, bipolar disorder. Not taking meds for the past few days. Pt has no n/v/f/c/chest pain/dib/abd pain. He has no headaches. Pt has been using cocaine and drinking heavily the last 4 days. Pt has tried to kill himself before, and currently plans on curring himself or jumping in front of traffic.  The history is provided by the patient.    Past Medical History  Diagnosis Date  . Depression   . Arthritis   . Bipolar 1 disorder   . PTSD (post-traumatic stress disorder)   . Schizophrenia    History reviewed. No pertinent past surgical history. History reviewed. No pertinent family history. History  Substance Use Topics  . Smoking status: Current Every Day Smoker -- 0.00 packs/day  . Smokeless tobacco: Not on file  . Alcohol Use: 0.0 oz/week    Review of Systems  Constitutional: Negative for activity change and appetite change.  Respiratory: Negative for cough and shortness of breath.   Cardiovascular: Negative for chest pain.  Gastrointestinal: Negative for abdominal pain.  Genitourinary: Negative for dysuria.  Psychiatric/Behavioral: Positive for suicidal ideas and self-injury. Negative for behavioral problems and confusion.    Allergies  Review of patient's allergies indicates no known allergies.  Home Medications  No current outpatient prescriptions on file. BP 137/88  Pulse 86  Temp(Src) 97.8 F (36.6 C) (Oral)  Resp 18  SpO2 99% Physical Exam  Nursing note and vitals reviewed. Constitutional: He is oriented to person, place, and time. He appears well-developed.  HENT:  Head: Normocephalic and  atraumatic.  Eyes: Conjunctivae and EOM are normal. Pupils are equal, round, and reactive to light.  Neck: Normal range of motion. Neck supple.  Cardiovascular: Normal rate, regular rhythm and normal heart sounds.   Pulmonary/Chest: Effort normal and breath sounds normal. No respiratory distress. He has no wheezes.  Abdominal: Soft. Bowel sounds are normal. He exhibits no distension. There is no tenderness. There is no rebound and no guarding.  Neurological: He is alert and oriented to person, place, and time.  Skin: Skin is warm.    ED Course  Procedures (including critical care time) Labs Review Labs Reviewed  COMPREHENSIVE METABOLIC PANEL - Abnormal; Notable for the following:    Glucose, Bld 118 (*)    AST 43 (*)    ALT 62 (*)    GFR calc non Af Amer 84 (*)    All other components within normal limits  SALICYLATE LEVEL - Abnormal; Notable for the following:    Salicylate Lvl <2.0 (*)    All other components within normal limits  URINE RAPID DRUG SCREEN (HOSP PERFORMED) - Abnormal; Notable for the following:    Cocaine POSITIVE (*)    All other components within normal limits  ACETAMINOPHEN LEVEL  CBC  ETHANOL   Imaging Review No results found.  EKG Interpretation    Date/Time:  Wednesday June 01 2013 12:15:52 EST Ventricular Rate:  78 PR Interval:  132 QRS Duration: 86 QT Interval:  406 QTC Calculation: 462 R Axis:   55 Text Interpretation:  Normal sinus rhythm Minimal voltage criteria for LVH, may be normal variant Borderline ECG Confirmed by Rhunette CroftNANAVATI, MD, Janey GentaANKIT (  [id number]) on 06/01/2013 4:13:50 PM            MDM  No diagnosis found.  DDx: Depression Bipolar disorder Schizophrenia Substance abuse Suicidal ideation Acute withdrawal  Pt comes in with cc of SI.  Has psych hx - currently stable. Will get psych consultation.   Derwood Kaplan, MD 06/01/13 1191  Derwood Kaplan, MD 06/01/13 4782

## 2013-06-01 NOTE — ED Provider Notes (Signed)
4:03 PM [facility] by psych. Dr. Juleen ChinaKohut accepts patient to Psych ED.  Audree CamelScott T Delorese Sellin, MD 06/01/13 [phone]

## 2013-06-01 NOTE — ED Notes (Signed)
Pt has arrived on pod c. Sitter is at bedside

## 2013-06-01 NOTE — Progress Notes (Signed)
Incoming TTS and MHT staff will follow up on the following referrals that were sent out today by the MHT Smitty Cords(Bruce)  [facility]  [facility]  [facility] left voice message to return call for bed availabilty  [facility] for [facility]  [facility] for review  [facility] no answer  [facility]  [facility] for [facility]  [facility]  [facility]  Writer informed the nurse working with the patient and the [facility] Minerva Areola(Eric) that the patient is pending placement at other hospitals.

## 2013-06-01 NOTE — ED Notes (Signed)
TTS COMPLETED 

## 2013-06-01 NOTE — BHH Counselor (Signed)
Writer discussed pt w/ Thurman CoyerEric [facility] who [facility] so [facility]. Writer notified MusicianAnnette RN.  Evette Cristalaroline Pagliaro Sudais Banghart, [place] Assessment Counselor

## 2013-06-01 NOTE — ED Notes (Signed)
HAVE SENT 2 REQUESTS TO PHARMACY FOR PT MEDICATION. WHEN I CALLED PHARMACY THEY ADVISE THAT THE MEDICINE WAS SENT. THERE HAVE BEEN 2 REQUESTS MADE. THEY ADVISE THE PHARMACIST THAT FILLED THE ORDER IS IN A MEETING

## 2013-06-01 NOTE — ED Notes (Signed)
Pt c/o SI with plan to "hit self with bottle, cut self or jump into traffic"; pt seen here for same in past; pt sts off meds x 3 weeks

## 2013-06-02 NOTE — Progress Notes (Addendum)
CSW followed up on referrals for patient.   [facility]  [facility] [facility] [facility]  [facility] for review no substance abuse  [facility] faxed for review No substance abuse [facility] no answer  [facility] for review was told that patient had been placed, now under review again per CenturiaDoug.  [facility] faxed for review CSW spoke with Coy Saunasosemary, who states patient is being reviewed, answer after 5pm today.  [facility] CSW spoke with Victorino DikeJennifer and patient still being reviewed, and to check back later today.  [facility] Authorization # [id number] CSW confirmed with Sharma Covertorman that patient referral was received and completed demographics by phone. Pt referral pending nurse review.    [facility], [facility].   Oncoming tts to follow up with placement.  Catha Gosselin.Jarek Longton Stewart, LCSW [phone]  ED CSW .06/02/2013 1446pm
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
## 2013-06-03 NOTE — Progress Notes (Addendum)
CSW received call from Wattsarolyn, regarding patient discharge plan  [facility] Upon discharge patient was referred for act services however pt declined. Pt had services with addiction and recovery services however those expired 11/2012 because patient was non compliant. Per discussion with Eber JonesCarolyn, she recommended petioning for someone to be payee and is going to fax a form. Pt care coordinator Danton ClapLynn Beady [phone].    Catha Gosselin.Chasyn Cinque Stewart, LCSW [phone]  ED CSW .06/03/2013 1433pm

## 2013-06-03 NOTE — Progress Notes (Signed)
   CARE MANAGEMENT ED NOTE 06/03/2013  Patient:  [name],[name]   Account Number:  [id number]  Date Initiated:  06/03/2013  Documentation initiated by:  Edd ArbourGIBBS,KIMBERLY  Subjective/Objective Assessment:   53 yr old [place] access pt who has a scanned 07/20/12 medicaid card not listing a pcp Pt confirms he has no pcp. Pt agreed to receive a list of [place] providers     Subjective/Objective Assessment Detail:     Action/Plan:   CM noted no pcp CM reviewed EPIC notes, scanned documents Cm spoke with pt Cm provided pt with [place] of [place] [employer] providers   Action/Plan Detail:   Anticipated DC Date:       Status Recommendation to Physician:   Result of Recommendation:    Other ED Services  Consult Working Plan    DC Planning Services  Other  PCP issues  Outpatient Services - Pt will follow up    Choice offered to / List presented to:            Status of service:  Completed, signed off  ED Comments:   ED Comments Detail:

## 2013-06-03 NOTE — Progress Notes (Signed)
Patient ID: [name], male   DOB: 06/03/1960, 53 y.o.   MRN: [id number] Psychiatric Specialty Exam: Physical Exam  ROS  Blood pressure 94/54, pulse 87, temperature 98.3 F (36.8 C), temperature source Oral, resp. rate 16, SpO2 96.00%.There is no weight on file to calculate BMI.  General Appearance: Casual and Disheveled  Eye Contact::  Good  Speech:  Clear and Coherent and Normal Rate  Volume:  Normal  Mood:  Anxious, Depressed, Hopeless and Worthless  Affect:  Congruent, Depressed and Flat  Thought Process:  Coherent and Goal Directed  Orientation:  Full (Time, Place, and Person)  Thought Content:  suicidal  Suicidal Thoughts:  Yes.  with intent/plan  Homicidal Thoughts:  No  Memory:  Immediate;   Good Recent;   Good Remote;   Good  Judgement:  Poor  Insight:  Lacking  Psychomotor Activity:  Normal  Concentration:  Good  Recall:  NA  Akathisia:  NA  Handed:  Right  AIMS (if indicated):     Assets:  Desire for Improvement Housing  Sleep:      Evaluated patient this am with Dr [name].  Patient is still endorsing suicide and plans to jump into a moving car.  Patient reports feeling hopeless, helpless and worthless.  Patient states " I have nothing to live for"  He reports poor sleep and fairly good appetite.  Writer spent 15 minutes discussing coping strategy with patient and need for him to think about  drug rehabilitation.     Plan:  Continue seeking [facility] Placement  [name]   PMHNP-BC

## 2013-06-03 NOTE — Progress Notes (Signed)
CSW confirmed with [facility]eronnie [facility]   .Catha GosselinKristen Stewart, LCSW [phone]  ED CSW .06/03/2013 906am

## 2013-06-04 NOTE — Progress Notes (Signed)
[phone] Spoke with Junious DresserConnie this am from [facility], stated that pt is still on wait list.   Tomi BambergerMariya Seanna Sisler, MHT

## 2013-06-04 NOTE — ED Notes (Signed)
Pt upset that his mother is not answering the phone, requesting something for anxiety.

## 2013-06-04 NOTE — ED Notes (Signed)
Up to the desk on the phone 

## 2013-06-04 NOTE — ED Notes (Signed)
Up to the bathroom 

## 2013-06-04 NOTE — ED Notes (Signed)
Psych MD and np into see

## 2013-06-04 NOTE — Progress Notes (Signed)
Patient continues to endorse suicidal ideation, states that he is depressed, cannot get his thoughts together and needs to be hospitalized. Patient was personally examined by me and medications were continued with patient on [facility]

## 2013-06-04 NOTE — Progress Notes (Signed)
Patient ID: [name], male   DOB: 08/07/1960, 53 y.o.   MRN: [id number] Psychiatric Specialty Exam: Physical Exam  ROS  Blood pressure 93/62, pulse 83, temperature 97.5 F (36.4 C), temperature source Oral, resp. rate 18, SpO2 97.00%.There is no weight on file to calculate BMI.  General Appearance: Casual  Eye Contact::  Poor  Speech:  Clear and Coherent and Normal Rate  Volume:  Normal  Mood:  Hopeless and Worthless  Affect:  Congruent, Depressed and Flat  Thought Process:  Goal Directed and Intact  Orientation:  Full (Time, Place, and Person)  Thought Content:  NA  Suicidal Thoughts:  Yes.  with intent/plan  Homicidal Thoughts:  No  Memory:  Immediate;   Good Recent;   Good Remote;   Good  Judgement:  Poor  Insight:  Fair  Psychomotor Activity:  Normal  Concentration:  Good  Recall:  Good  Akathisia:  NA  Handed:  Right  AIMS (if indicated):     Assets:  Desire for Improvement  Sleep:      Seen this am with Dr Elsie SaasJonnalagadda on rounds.  Patient still cannot contract for safety.  Patient says she is hopeless and helpless and will kill himself if discharged home.  He admits to receiving all of his medications.  Plan: [facility].  Dahlia ByesJosephine [name]   PMHNP-BC  Patient was seen face-to-face for the evaluation and case discussed with a physician extender and formulated treatment plan the Reviewed the information documented and agree with the treatment plan.  [name],Perry R. 06/04/2013 12:57 PM

## 2013-06-05 ENCOUNTER — Encounter (HOSPITAL_COMMUNITY): Payer: Self-pay | Admitting: Registered Nurse

## 2013-06-05 DIAGNOSIS — F1410 Cocaine abuse, uncomplicated: Secondary | ICD-10-CM

## 2013-06-05 DIAGNOSIS — F312 Bipolar disorder, current episode manic severe with psychotic features: Secondary | ICD-10-CM

## 2013-06-05 NOTE — ED Notes (Signed)
Psych md and np into see 

## 2013-06-05 NOTE — Progress Notes (Signed)
[phone] [facility], per [place] pt remains on the wait list.   Tomi BambergerMariya Jozette Castrellon, MHT
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
## 2013-06-06 MED ORDER — ARIPIPRAZOLE 10 MG PO TABS
10.0000 mg | ORAL_TABLET | Freq: Every day | ORAL | Status: DC
  Administered 2013-06-06 – 2013-06-08 (×3): 10 mg via ORAL
  Filled 2013-06-06 (×3): qty 1

## 2013-06-06 NOTE — Progress Notes (Signed)
Patient ID: Perry HeaterVincent [name], male   DOB: 12/17/1960, [age] y.o.   MRN: [id number] Psychiatric Specialty Exam: Physical Exam  ROS  Blood pressure 104/67, pulse 86, temperature 97.3 F (36.3 C), temperature source Oral, resp. rate 20, SpO2 99.00%.There is no weight on file to calculate BMI.  General Appearance: Casual and Fairly Groomed  Patent attorneyye Contact::  Fair  Speech:  Clear and Coherent and Normal Rate  Volume:  Normal  Mood:  Depressed  Affect:  Depressed and Flat  Thought Process:  Coherent  Orientation:  Full (Time, Place, and Person)  Thought Content:  SUICIDAL  Suicidal Thoughts:  Yes.  with intent/plan  Homicidal Thoughts:  No  Memory:  Immediate;   Good Recent;   Good Remote;   Good  Judgement:  Poor  Insight:  Fair  Psychomotor Activity:  Normal  Concentration:  Good  Recall:  NA  Akathisia:  NA  Handed:  Right  AIMS (if indicated):     Assets:  Desire for Improvement Housing  Sleep:      Saw patient in bed awake, alert and oriented x 3.  Patient was calm and cooperative.  He answered questions promptly and made fair eye contact with providers.  Patient today is still endorsing suicide with plans to jump into traffic.  Patient states he feels hopeless and helpless and that he is not sure what he will do when he is discharged from here.  He also reports hearing voices telling him to kill himself by jumping in front of a moving vehicle.  Patient states he has been on Abilify in the past and will like to go back on that now.  Plan: We will continue to wait for [facility] placement We will restart his Abilify 10 mg po daily We will continue to provide safety and stabilization.  [name]   PMHNP-BC  Patient evaluated by me, continues to endorse suicidal ideation, plan formulated by me. [facility] Perry RoutKUMAR,Jillann Charette, MD

## 2013-06-07 ENCOUNTER — Encounter (HOSPITAL_COMMUNITY): Payer: Self-pay | Admitting: Registered Nurse

## 2013-06-07 DIAGNOSIS — F3289 Other specified depressive episodes: Secondary | ICD-10-CM

## 2013-06-07 DIAGNOSIS — R45851 Suicidal ideations: Secondary | ICD-10-CM

## 2013-06-07 DIAGNOSIS — F329 Major depressive disorder, single episode, unspecified: Secondary | ICD-10-CM

## 2013-06-07 NOTE — Progress Notes (Signed)
CSW confirmed with robinette that patient is on [facility] waitlist.   .Catha GosselinKristen Stewart, LCSW [phone]  ED CSW .06/07/2013 1128am

## 2013-06-07 NOTE — Consult Note (Signed)
Note reviewed and agreed with  

## 2013-06-07 NOTE — ED Notes (Signed)
Patient reports SI with passive AH. Contracts for safety. Anxiety 7/10, depression 10/10. Patient out of bed and showered. States he hadn't had energy or felt like doing anything.  Encouragement offered. Motrin given.  Patient safety maintained, Q 15 checks continue.
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
## 2013-06-08 MED ORDER — ARIPIPRAZOLE 10 MG PO TABS
20.0000 mg | ORAL_TABLET | Freq: Every day | ORAL | Status: DC
  Administered 2013-06-09 – 2013-06-13 (×5): 20 mg via ORAL
  Filled 2013-06-08 (×5): qty 2

## 2013-06-08 NOTE — Consult Note (Signed)
  Psychiatric Specialty Exam: Physical Exam  ROS complains of arthritis in his knees but otherwise no systems complaints  Blood pressure 126/77, pulse 81, temperature 97.9 F (36.6 C), temperature source Oral, resp. rate 18, SpO2 99.00%.There is no weight on file to calculate BMI.  General Appearance: Fairly Groomed  Patent attorneyye Contact::  Good  Speech:  Clear and Coherent  Volume:  Normal  Mood:  Depressed  Affect:  Congruent  Thought Process:  Coherent  Orientation:  Full (Time, Place, and Person)  Thought Content:  Hallucinations: Auditory Visual  Suicidal Thoughts:  Yes.  with intent/plan  Homicidal Thoughts:  No  Memory:  Immediate;   Good Recent;   Good Remote;   Good  Judgement:  Poor  Insight:  Lacking  Psychomotor Activity:  Normal  Concentration:  Good  Recall:  Good  Akathisia:  Negative  Handed:  Right  AIMS (if indicated):     Assets:  Communication Skills  Sleep:   good   [name] insists he is still suicidal still periodically hallucinating auditorially and visually.  The Abilify has not helped at all he says so the dose will be adjusted today.  He seems perfectly comfortable but says he does have arthritic knee pain when ROS was conducted.

## 2013-06-08 NOTE — ED Notes (Signed)
Patient reports passive SI and AH. Appears mildly anxious. States "I'm making it through another day".   Encouragement offered. Given Motrin for arthritic pain.   Patient safety maintained, Q 15 checks continue.

## 2013-06-08 NOTE — ED Notes (Signed)
MD called and made aware of [facility]. Awaiting orders

## 2013-06-08 NOTE — Progress Notes (Signed)
CSW [facility] with Brett CanalesSteve.   Catha Gosselin.Corretta Munce Stewart, LCSW [phone]  ED CSW .06/08/2013 1141am

## 2013-06-09 DIAGNOSIS — F315 Bipolar disorder, current episode depressed, severe, with psychotic features: Secondary | ICD-10-CM

## 2013-06-09 NOTE — ED Notes (Signed)
md and shuvon np into see

## 2013-06-09 NOTE — ED Notes (Signed)
Up to the desk on the phone 

## 2013-06-09 NOTE — Consult Note (Signed)
  Psychiatric Specialty Exam: Physical Exam  ROS  Blood pressure 105/73, pulse 85, temperature 97.8 F (36.6 C), temperature source Oral, resp. rate 18, SpO2 98.00%.There is no weight on file to calculate BMI.  General Appearance: Casual  Eye Contact::  Good  Speech:  Clear and Coherent  Volume:  Normal  Mood:  Depressed  Affect:  Appropriate  Thought Process:  Goal Directed and Logical  Orientation:  Full (Time, Place, and Person)  Thought Content:  Negative  Suicidal Thoughts:  Yes.  with intent/plan  Homicidal Thoughts:  No  Memory:  Immediate;   Good Recent;   Good Remote;   Good  Judgement:  Intact  Insight:  Fair  Psychomotor Activity:  Normal  Concentration:  Good  Recall:  Good  Akathisia:  Negative  Handed:  Right  AIMS (if indicated):     Assets:  Communication Skills  Sleep:   good  [name] says he is still depressed, "hopeless","I feel like blowing my brains out" His  Abilify was increased to 20 mg and he has noticed no difference in hallucinations or depression.  Says he gets emotional support from his family abut that is not enough.  Diagnosis is Bipolar disorder I  Current episode depressed with psychotic features.  Abilify should help both the depression and moods as well as the hallucinations.   Review of Systems  Constitutional: Negative.   HENT: Negative.   Eyes: Negative.   Respiratory: Negative.   Cardiovascular: Negative.   Gastrointestinal: Negative.   Genitourinary: Negative.   Musculoskeletal: Positive for joint pain.  Skin: Negative.   Endo/Heme/Allergies: Negative.   Psychiatric/Behavioral: Negative.

## 2013-06-09 NOTE — Consult Note (Signed)
  Review of Systems  Constitutional: Negative.   HENT: Negative.   Eyes: Negative.   Respiratory: Negative.   Cardiovascular: Negative.   Gastrointestinal: Negative.   Genitourinary: Negative.   Musculoskeletal: Positive for joint pain.  Skin: Negative.   Endo/Heme/Allergies: Negative.   Psychiatric/Behavioral: Negative.

## 2013-06-10 NOTE — ED Notes (Signed)
Pt attended group in the activity room. Group topic was sleep hygiene and positive benefits to getting appropriate amounts of sleep and ways to improve their sleep hygiene. Pt stated one thing he could improve about his sleep is setting a schedule. Waking up at the same time and going to sleep at the same time everyday to help create an internal clock to maintain an consistent sleep schedule. Pt also stated at times he sleeps too much during the day and needs to work on staying awake during the day.

## 2013-06-10 NOTE — Progress Notes (Signed)
Patient seems to be doing better cognitively but continues to have suicidal ideation with a plan to jump in front of a car. Patient to continue to wait for [facility]

## 2013-06-10 NOTE — Progress Notes (Signed)
Patient ID: Garry HeaterVincent Traore, male   DOB: 12/31/1960, 53 y.o.   MRN: [id number] Psychiatric Specialty Exam: Physical Exam  ROS  Blood pressure 102/64, pulse 82, temperature 97.5 F (36.4 C), temperature source Oral, resp. rate 18, SpO2 98.00%.There is no weight on file to calculate BMI.  General Appearance: Casual and Fairly Groomed  Patent attorneyye Contact::  Good  Speech:  Clear and Coherent and Normal Rate  Volume:  Normal  Mood:  Depressed  Affect:  Depressed and Flat  Thought Process:  Coherent and Goal Directed  Orientation:  Full (Time, Place, and Person)  Thought Content:  Suicidal thoughts to jump in front of a moving vehicle  Suicidal Thoughts:  Yes.  with intent/plan  Homicidal Thoughts:  No  Memory:  Immediate;   Good Recent;   Good Remote;   Good  Judgement:  Good  Insight:  Present  Psychomotor Activity:  Normal  Concentration:  Good  Recall:  NA  Akathisia:  NA  Handed:  Right  AIMS (if indicated):     Assets:  Desire for Improvement  Sleep:      Patient is still suicidal with plans to jump in front of a moving vehicle.  Patient is calm, cooperative and reports poor sleep last night.  Patient received Ambien and Vistaril but had minimal amount of sleep.  His Abilify was increased to 20 mg po but patient states he is still hearing voices telling him to kill self.  We will continue to monitor patient while [facility]  Diagnosis: Bipolar 1 disorder with Psychotic features.  Rounded on patient with Dr [name]   Plan:  [facility], offer all medications as ordered.  Dahlia ByesJosephine Kynadi Dragos  PMHNP-BC

## 2013-06-10 NOTE — Progress Notes (Signed)
CSW confirmed with Robinette, [facility]. CSW received [facility] who asked if patient may be interested in talking with previous Child psychotherapistsocial worker from [facility]. CSW provided patient with number. Pt aware [facility].   Catha GosselinKristen Stewart, LCSW [phone]  ED CSW .06/10/2013 1226pm

## 2013-06-11 DIAGNOSIS — F29 Unspecified psychosis not due to a substance or known physiological condition: Secondary | ICD-10-CM

## 2013-06-11 NOTE — ED Notes (Signed)
Psych MD and NP into see 

## 2013-06-11 NOTE — ED Notes (Signed)
Patient came to group and participated. The topic was overcoming stress. He reported that one of his triggers is situations that are out of his control. He said that he likes to listen to music when he gets stressed out. He said that he learned that stress can serve a positive role when it acts as a motivator.  Jessa Stinson A 1:15 PM

## 2013-06-11 NOTE — Progress Notes (Signed)
[id number] [facility].   Tomi BambergerMariya Allona Gondek, MHT

## 2013-06-11 NOTE — ED Notes (Signed)
Watching tv in the activity

## 2013-06-11 NOTE — ED Notes (Signed)
Watching tv in the activity room

## 2013-06-11 NOTE — Progress Notes (Signed)
Patient ID: [name], male   DOB: 03/02/1961, 53 y.o.   MRN: [id number] Psychiatric Specialty Exam: Physical Exam  ROS  Blood pressure 94/60, pulse 90, temperature 97.7 F (36.5 C), temperature source Oral, resp. rate 18, SpO2 96.00%.There is no weight on file to calculate BMI.  General Appearance: Casual and Fairly Groomed  Patent attorneyye Contact::  Good  Speech:  Clear and Coherent and Normal Rate  Volume:  Normal  Mood:  Depressed  Affect:  Congruent and Depressed  Thought Process:  Coherent and Goal Directed  Orientation:  Full (Time, Place, and Person)  Thought Content:  Repoerts Abilify is effective.  He ias feeling less suicidal at this time.  Suicidal Thoughts:  Yes.  without intent/plan  Homicidal Thoughts:  No  Memory:  Immediate;   Good Recent;   Good Remote;   Good  Judgement:  Fair  Insight:  Good  Psychomotor Activity:  Normal  Concentration:  Good  Recall:  NA  Akathisia:  NA  Handed:  Right  AIMS (if indicated):     Assets:  Desire for Improvement  Sleep:      Rounded on patient this am with Dr Elsie SaasJonnalagadda.  Patient states he is feeling better after his Abilify is increased to 20 mg.  Patient is now feeling much better and is seen in the dayroom watching TV and participating in groups.  Patient states he is willing to move back to his Mother by Monday if he continues to feel better.  Writer discussed coping skills with patient and need to continue taking his Abilify at home when discharged.  He reports feeling less suicidal and denies HI/AVH.  We will continue to monitor patient.  Plan:  Continue current plan, encourage to participate in group.  Perry ByesJosephine Onuoha   PMHNP-BC  Patient was seen face-to-face for this evaluation along with physician extender and reviewed the information documented by physician extender and agree with the treatment plan.   [name],[name] 06/11/2013 6:55 PM

## 2013-06-11 NOTE — ED Notes (Signed)
In the activity room watching tv 

## 2013-06-11 NOTE — ED Notes (Signed)
Watching tv in activity room 

## 2013-06-11 NOTE — ED Notes (Signed)
Pt in activity room w/ group 

## 2013-06-11 NOTE — ED Notes (Signed)
Up to the bathroom 

## 2013-06-12 NOTE — ED Notes (Signed)
Adult Psychoeducational Group Note  Date:  06/12/2013 Time:  12:45 PM  Group Topic/Focus:  Overcoming Stress:   The focus of this group is to define stress and help patients assess their triggers.  Participation Level:  Active  Participation Quality:  Appropriate  Affect:  Appropriate  Cognitive:  Appropriate  Insight: Good  Engagement in Group:  Engaged  Modes of Intervention:  Discussion  Additional Comments:  [name] was alert and participated in group.   [name], [name] 06/12/2013, 12:45 PM

## 2013-06-12 NOTE — ED Notes (Signed)
In the day room watching tv 

## 2013-06-12 NOTE — Consult Note (Signed)
CC:  "Suicidal ideation with plan to walk into traffic; verbal hallucinations telling me to kill my self"  HPI:  At this time patient states that he is feeling a little better.  States that the medication is working and that his suicidal thoughts are off and on at this time and not constant.  Patient also states that the voices are not as loud and not as fast as they were when he first came to hospital.  Patient states that he mother wants to know when he would be coming home and if [facility]   Patient informed that [facility]  Medication review:  No changes at this time will continue with current medication regimen. Labs review:  No new labs ordered Records review:  No changes Family History: No family mental illness  Psychiatric Specialty Exam: Physical Exam  Review of Systems  Constitutional: Negative for fever and chills.  Cardiovascular: Negative for chest pain.  Gastrointestinal: Negative for nausea and vomiting.  Musculoskeletal: Positive for joint pain.  Neurological: Negative for headaches.  Psychiatric/Behavioral: Positive for depression (rates 7/10), suicidal ideas (Off and on now. not constant) and hallucinations (States that the voices are not as loud and fast at they were).    Blood pressure 101/64, pulse 82, temperature 97.5 F (36.4 C), temperature source Oral, resp. rate 17, SpO2 96.00%.There is no weight on file to calculate BMI.  General Appearance: Casual  Eye Contact::  Good  Speech:  Clear and Coherent  Volume:  Normal  Mood:  Depressed  Affect:  Appropriate  Thought Process:  Goal Directed and Logical  Orientation:  Full (Time, Place, and Person)  Thought Content:  Negative  Suicidal Thoughts:  Yes.  with intent/plan  Occurs on and off at this time.  Homicidal Thoughts:  No  Memory:  Immediate;   Good Recent;   Good Remote;   Good  Judgement:  Intact  Insight:  Fair  Psychomotor Activity:  Normal   Concentration:  Good  Recall:  Good  Akathisia:  Negative  Handed:  Right  AIMS (if indicated):     Assets:  Communication Skills  Sleep:   good   Face to face consult with Dr. Elsie SaasJonnalagadda  Disposition:  Continue with current plan for inpatient treatment.  [facility]  Will continue to monitor for safety and stabilization.  Medications changes/additions as needed.    Shuvon B. Rankin FNP-BC  Patient was seen face to face for this evaluation and case discussed with physician extender. Reviewed the information documented by physician extender and agree with the treatment plan.  Sallie Maker,[name] 06/13/2013 5:39 PM

## 2013-06-12 NOTE — ED Notes (Signed)
On the phone 

## 2013-06-12 NOTE — Progress Notes (Signed)
[id number] [facility] and spoke with Renee PainLee Harris, pt remains on their wait list.   Tomi BambergerMariya Merlon Alcorta Disposition MHT

## 2013-06-12 NOTE — ED Notes (Addendum)
Psych md and np into see 

## 2013-06-13 DIAGNOSIS — F3130 Bipolar disorder, current episode depressed, mild or moderate severity, unspecified: Secondary | ICD-10-CM

## 2013-06-13 DIAGNOSIS — F1010 Alcohol abuse, uncomplicated: Secondary | ICD-10-CM

## 2013-06-13 NOTE — Progress Notes (Addendum)
Per discussion with psychiatrist, patient psychiatrically stable for discharge home. [facility]. [facility] CSW also provided patient with [facility] information to continue to stay in touch with regarding pending treatment bed. Pt denies SI/HI/AH/VH. [facility].   Catha Gosselin.Celso Granja Stewart, LCSW [phone]  ED CSW .06/13/2013 1129am   Pt inquired about [facility], csw called [facility] for treatment, and there is a 3 month wait list. Pt states he prefers to dc home and follow up with [facility].   Catha Gosselin.Tajai Ihde Stewart, LCSW [phone]  ED CSW .06/13/2013 1129am

## 2013-06-13 NOTE — Progress Notes (Signed)
Pt attended group on Community Resources in the area. Pt's were encouraged to maintain their mental health before a crisis occurs. Included in the discussion was taking their prescribed medicine, attending scheduled appointments and reaching out to resources, agencies and support people.  This patient listened intently to the staff and received a printed handout of local resources.  

## 2013-06-13 NOTE — Progress Notes (Signed)
Patient ID: [name], male   DOB: 02/23/1961, 53 y.o.   MRN: [id number]  Patient is a 53 year old with Bipolar, depression and cocaine/alcohol abuse. He denies SI/HI/AVH. He will be discharged home, and from there [facility]   Psychiatric Specialty Exam: Physical Exam  ROS  Blood pressure 118/78, pulse 98, temperature 97.6 F (36.4 C), temperature source Oral, resp. rate 20, SpO2 95.00%.There is no weight on file to calculate BMI.  General Appearance: Casual  Eye Contact::  Fair  Speech:  Normal Rate  Volume:  Normal  Mood:  Dysphoric  Affect:  Restricted  Thought Process:  Coherent and Goal Directed  Orientation:  Full (Time, Place, and Person)  Thought Content:  WDL  Suicidal Thoughts:  No  Homicidal Thoughts:  No  Memory:  Immediate;   Fair Recent;   Fair Remote;   Fair  Judgement:  Fair  Insight:  Fair  Psychomotor Activity:  Normal  Concentration:  Fair  Recall:  Fair  Akathisia:  No  Handed:  Right  AIMS (if indicated):   0  Assets:  Physical Health Resilience  Sleep:   fair     I have personally seen the patient and agreed with the findings and involved in the treatment plan. Seen the patient with provider and discussed in team meet. Plan to be discharged and [facility] for substnce abuse treatment.  Thresa RossNadeem Tanecia Mccay, MD

## 2013-06-13 NOTE — [facility] Suicide Risk Assessment (Cosign Needed)
Suicide Risk Assessment  Discharge Assessment     Demographic Factors:  Male, Low socioeconomic status and Unemployed  Mental Status Per Nursing Assessment::   On Admission:     Current Mental Status by Physician: NA  Loss Factors: Decrease in vocational status, Decline in physical health and Financial problems/change in socioeconomic status  Historical Factors: Prior suicide attempts and Impulsivity  Risk Reduction Factors:   Positive social support and Positive therapeutic relationship  Continued Clinical Symptoms:  Bipolar Disorder:   Depressive phase  Cognitive Features That Contribute To Risk:  Closed-mindedness Loss of executive function Polarized thinking Thought constriction (tunnel vision)    Suicide Risk:  Minimal: No identifiable suicidal ideation.  Patients presenting with no risk factors but with morbid ruminations; may be classified as minimal risk based on the severity of the depressive symptoms  Discharge Diagnoses:   AXIS I:  Bipolar, Depressed and Substance Abuse AXIS II:  Deferred AXIS III:   Past Medical History  Diagnosis Date  . Depression   . Arthritis   . Bipolar 1 disorder   . PTSD (post-traumatic stress disorder)   . Schizophrenia    AXIS IV:  economic problems, educational problems, housing problems, occupational problems, other psychosocial or environmental problems, problems related to legal system/crime, problems related to social environment, problems with access to health care services and problems with primary support group AXIS V:  61-70 mild symptoms  Plan Of Care/Follow-up recommendations:  Activity:  as tolerated  Diet:  regular Tests:  na Other:  na  Is patient on multiple antipsychotic therapies at discharge:  No   Has Patient had three or more failed trials of antipsychotic monotherapy by history:  No  Recommended Plan for Multiple Antipsychotic Therapies: NA  Ignacia Gentzler 06/13/2013, 1:28 PM

## 2013-06-13 NOTE — Progress Notes (Signed)
Per Mindi JunkerMarsha, pt remains on [facility].  Blain PaisMichelle L Jahnaya Branscome, MHT/NS

## 2013-07-11 ENCOUNTER — Emergency Department (HOSPITAL_COMMUNITY)
Admission: EM | Admit: 2013-07-11 | Discharge: 2013-07-13 | Payer: Medicaid Other | Attending: Emergency Medicine | Admitting: Emergency Medicine

## 2013-07-11 ENCOUNTER — Encounter (HOSPITAL_COMMUNITY): Payer: Self-pay | Admitting: Emergency Medicine

## 2013-07-11 DIAGNOSIS — F329 Major depressive disorder, single episode, unspecified: Secondary | ICD-10-CM | POA: Insufficient documentation

## 2013-07-11 DIAGNOSIS — R443 Hallucinations, unspecified: Secondary | ICD-10-CM | POA: Insufficient documentation

## 2013-07-11 DIAGNOSIS — F3289 Other specified depressive episodes: Secondary | ICD-10-CM | POA: Insufficient documentation

## 2013-07-11 DIAGNOSIS — F17200 Nicotine dependence, unspecified, uncomplicated: Secondary | ICD-10-CM | POA: Insufficient documentation

## 2013-07-11 DIAGNOSIS — Z8739 Personal history of other diseases of the musculoskeletal system and connective tissue: Secondary | ICD-10-CM | POA: Insufficient documentation

## 2013-07-11 DIAGNOSIS — F39 Unspecified mood [affective] disorder: Secondary | ICD-10-CM | POA: Insufficient documentation

## 2013-07-11 DIAGNOSIS — F32A Depression, unspecified: Secondary | ICD-10-CM

## 2013-07-11 DIAGNOSIS — R45851 Suicidal ideations: Secondary | ICD-10-CM

## 2013-07-11 DIAGNOSIS — Z8659 Personal history of other mental and behavioral disorders: Secondary | ICD-10-CM | POA: Insufficient documentation

## 2013-07-11 LAB — COMPREHENSIVE METABOLIC PANEL
ALK PHOS: 70 U/L (ref 39–117)
ALT: 24 U/L (ref 0–53)
AST: 22 U/L (ref 0–37)
Albumin: 3.9 g/dL (ref 3.5–5.2)
BILIRUBIN TOTAL: 0.3 mg/dL (ref 0.3–1.2)
BUN: 13 mg/dL (ref 6–23)
CO2: 24 mEq/L (ref 19–32)
Calcium: 9.3 mg/dL (ref 8.4–10.5)
Chloride: 101 mEq/L (ref 96–112)
Creatinine, Ser: 1.04 mg/dL (ref 0.50–1.35)
GFR calc non Af Amer: 81 mL/min — ABNORMAL LOW (ref >90)
GLUCOSE: 115 mg/dL — AB (ref 70–99)
POTASSIUM: 3.7 meq/L (ref 3.7–5.3)
SODIUM: 138 meq/L (ref 137–147)
TOTAL PROTEIN: 7.7 g/dL (ref 6.0–8.3)

## 2013-07-11 LAB — CBC
HEMATOCRIT: 44.4 % (ref 39.0–52.0)
Hemoglobin: 14.9 g/dL (ref 13.0–17.0)
MCH: 30.2 pg (ref 26.0–34.0)
MCHC: 33.6 g/dL (ref 30.0–36.0)
MCV: 90.1 fL (ref 78.0–100.0)
Platelets: 255 10*3/uL (ref 150–400)
RBC: 4.93 MIL/uL (ref 4.22–5.81)
RDW: 12.9 % (ref 11.5–15.5)
WBC: 5.3 10*3/uL (ref 4.0–10.5)

## 2013-07-11 LAB — RAPID URINE DRUG SCREEN, HOSP PERFORMED
AMPHETAMINES: NOT DETECTED
BARBITURATES: NOT DETECTED
Benzodiazepines: NOT DETECTED
COCAINE: NOT DETECTED
OPIATES: NOT DETECTED
TETRAHYDROCANNABINOL: NOT DETECTED

## 2013-07-11 LAB — ETHANOL

## 2013-07-11 LAB — ACETAMINOPHEN LEVEL: Acetaminophen (Tylenol), Serum: <15 ug/mL (ref 10–30)

## 2013-07-11 LAB — SALICYLATE LEVEL: Salicylate Lvl: <2 mg/dL — ABNORMAL LOW (ref 2.8–20.0)

## 2013-07-11 MED ORDER — OLANZAPINE 5 MG PO TABS
5.0000 mg | ORAL_TABLET | Freq: Every day | ORAL | Status: DC
  Administered 2013-07-11 – 2013-07-12 (×2): 5 mg via ORAL
  Filled 2013-07-11 (×2): qty 1

## 2013-07-11 MED ORDER — HYDROXYZINE PAMOATE 25 MG PO CAPS
25.0000 mg | ORAL_CAPSULE | Freq: Three times a day (TID) | ORAL | Status: DC | PRN
  Filled 2013-07-11: qty 1

## 2013-07-11 MED ORDER — LAMOTRIGINE 25 MG PO TABS
25.0000 mg | ORAL_TABLET | Freq: Every day | ORAL | Status: DC
  Administered 2013-07-11 – 2013-07-12 (×2): 25 mg via ORAL
  Filled 2013-07-11 (×4): qty 1

## 2013-07-11 MED ORDER — ZOLPIDEM TARTRATE 5 MG PO TABS
10.0000 mg | ORAL_TABLET | Freq: Every evening | ORAL | Status: DC | PRN
  Administered 2013-07-11 – 2013-07-12 (×2): 10 mg via ORAL
  Filled 2013-07-11 (×2): qty 2

## 2013-07-11 MED ORDER — HYDROXYZINE HCL 25 MG PO TABS
25.0000 mg | ORAL_TABLET | Freq: Three times a day (TID) | ORAL | Status: DC | PRN
  Administered 2013-07-11 – 2013-07-12 (×3): 25 mg via ORAL
  Filled 2013-07-11 (×2): qty 1
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
## 2013-07-11 NOTE — BH Assessment (Signed)
Consulted with EDP Dr. Criss AlvineGoldston to obtain clinicals prior to assessing patient.   Perry PeachNajah Arly Salminen, MS, LCASA Assessment Counselor

## 2013-07-11 NOTE — ED Notes (Signed)
Per pt sts he has been off of his psych meds and feels like cutting himself. sts he needs help getting back on his meds.

## 2013-07-11 NOTE — BH Assessment (Signed)
Pt assessed by TTS [facility] who reports that pt does meet inpatient criteria at this time. Pt will need to be referred to other hospitals as [facility] due to meetiing his maximum therapeutic. EDP Dr.Goldston  notifed of plan.  Glorious PeachNajah Kymberli Wiegand, MS, LCASA Assessment Counselor

## 2013-07-11 NOTE — ED Provider Notes (Signed)
CSN: [id number]     Arrival date & time 07/11/13  1537 History   First MD Initiated Contact with Patient 07/11/13 1649     Chief Complaint  Patient presents with  . Suicidal     (Consider location/radiation/quality/duration/timing/severity/associated sxs/prior Treatment) HPI Comments: 53 year old male presents with suicidal ideation and wanting to get back on his psych meds. He states that he has bipolar and PTSD and is not taken his Lamictal, Zyprexa, Ambien, and Vistaril for the past 2 weeks. He states he lost Medicaid card as been unable to get those medications. He was caught trying to take some of his mother's sleeping pills so she referred him to the ER to be evaluated. He states for the past week he's been depressed and does want to cut himself and possibly jumping from traffic to kill himself. States he's been feeling down. He states that he did feel better what is on his other medications. Over the last 2 days also been having hallucinations of seeing his dead grandmother spirit.   Past Medical History  Diagnosis Date  . Depression   . Arthritis   . Bipolar 1 disorder   . PTSD (post-traumatic stress disorder)   . Schizophrenia    History reviewed. No pertinent past surgical history. History reviewed. No pertinent family history. History  Substance Use Topics  . Smoking status: Current Every Day Smoker -- 0.00 packs/day  . Smokeless tobacco: Not on file  . Alcohol Use: 0.0 oz/week    Review of Systems  Constitutional: Negative for fever.  Psychiatric/Behavioral: Positive for suicidal ideas, hallucinations and dysphoric mood. Negative for self-injury.  All other systems reviewed and are negative.      Allergies  Review of patient's allergies indicates no known allergies.  Home Medications  No current outpatient prescriptions on file. BP 113/63  Pulse 74  Temp(Src) 98.3 F (36.8 C)  Resp 18  SpO2 97% Physical Exam  Nursing note and vitals  reviewed. Constitutional: He is oriented to person, place, and time. He appears well-developed and well-nourished. No distress.  HENT:  Head: Normocephalic and atraumatic.  Right Ear: External ear normal.  Left Ear: External ear normal.  Nose: Nose normal.  Eyes: Right eye exhibits no discharge. Left eye exhibits no discharge.  Neck: Neck supple.  Cardiovascular: Normal rate, regular rhythm, normal heart sounds and intact distal pulses.   Pulmonary/Chest: Effort normal.  Abdominal: He exhibits no distension.  Musculoskeletal: He exhibits no edema.  Neurological: He is alert and oriented to person, place, and time.  Skin: Skin is warm and dry.  Psychiatric: He is not actively hallucinating. He expresses suicidal ideation. He expresses no homicidal ideation.  Flat affect    ED Course  Procedures (including critical care time) Labs Review Labs Reviewed  COMPREHENSIVE METABOLIC PANEL - Abnormal; Notable for the following:    Glucose, Bld 115 (*)    GFR calc non Af Amer 81 (*)    All other components within normal limits  SALICYLATE LEVEL - Abnormal; Notable for the following:    Salicylate Lvl <2.0 (*)    All other components within normal limits  CBC  ETHANOL  ACETAMINOPHEN LEVEL  URINE RAPID DRUG SCREEN (HOSP PERFORMED)   Imaging Review No results found.  EKG Interpretation   None       MDM   Final diagnoses:  Depression with suicidal ideation    Patient with recurrent SI since being off meds for past 2 weeks. He has vague plan. Psych consulted for  med recs and plan for his SI. They feel he meets inpatient criteria and they will look for placement. Home meds re-ordered.    Audree Camel, MD 07/11/13 2139

## 2013-07-12 NOTE — ED Notes (Signed)
Pt came out to nurses station and states that he wants to make sure he has a ride back from the facility after [facility]. [facility] called and stated they only transport patients if there is a method of payment from a facility. [facility] called back and stated that they were unable to provide transport back to the county if patient came to their facility. PT informed and states that this is why he wants to be IVC'd so that he can have rides there and back because his mother does not want to come get him. Pt states why can't he just wait here for someone to get discharged from a local facility or why can't we just IVC him so he can have a ride there and back.

## 2013-07-12 NOTE — ED Notes (Signed)
Pt states he does NOT want to go out of the county and requests to still be IVC'd.

## 2013-07-12 NOTE — BHH Counselor (Addendum)
[facility] and states Dr. Guss [place]alla has accepted pt and they would take pt either as voluntary or under IVC. Phone # for report to [facility] is same as admissions # [phone].  Clydie BraunKaren RN spoke w/ pt who at first agreed to go to [facility], but pt now stating that he wants to go [facility]. Per chart review, pt has reached maximum [facility]. Writer prepared IVC paperwork and faxed to American FinancialKaren RN who will get EDP Pickering to sign if he agrees pt in need of IVC. Writer spoke w/ Marylu LundJanet at [facility] and gave latest updated.   Evette Cristalaroline Sundquist Xitlally [place], [place] Assessment Counselor

## 2013-07-12 NOTE — ED Notes (Addendum)
Spoke with [facility], [place], ACT coounselor has left. New  ACT Counselor is checking on the papers and will fax them.

## 2013-07-12 NOTE — BH Assessment (Signed)
[facility]. Referred to [facility] following facilities: [facility], [place], [facility], [facility], [address], and [facility]   No beds at [address] [place], [place], [place], [place], [facility], [address], [address], and [place].

## 2013-07-12 NOTE — ED Notes (Signed)
Pt [facility] of [facility]-- can be [facility]. Pt requesting to go by Sheriff's Dept. Stating that he would rather be IVC'd so he would know that he would have a ride there and back. Zheng at [facility] notified of pt's wishes.  Pt pacing in room and hallway outside his door. Shifting weight from one foot to another-- unable to stand still. Doris-- Child psychotherapistsocial worker on the phone with pt's mother. Pt's mother does not want [facility]. Pt now wanting to stay here or go [facility]-- or [facility]-- staying in town instead of going out of town. Courser-- from [facility] made aware.

## 2013-07-12 NOTE — BHH Counselor (Signed)
Spoke with Tobi BastosAnna R(pt.'s nurse) informed her that IVC petition would need to be completed by EDP and faxed to magistrate.  Pt would be served by police dept and pt would be [facility] 07/[facility]/13

## 2013-07-12 NOTE — BHH Counselor (Signed)
[facility] [phone] is willing to review patient for admission if patient is placed under IVC. Writer notified EDP-Dr. Rubin PayorPickering who will evaluate patient and to see if pt is committable. Dr. Rubin PayorPickering will contact TTS once he has evaluated this patient.

## 2013-07-12 NOTE — ED Notes (Signed)
Offered pt Atarax for anxiety -- agreed to take med

## 2013-07-12 NOTE — ED Notes (Signed)
Spoke with Dr. Jodi MourningZavitz and  Informed him that he will need to see pt. For assessment .
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
## 2013-07-13 NOTE — Progress Notes (Signed)
CSW placed phone call to pt's mother as requested by pt. Pt's mother informed of the MD plan of care which is an inpatient hospital stay to stabilize on medication. [facility] [place], [place]. Pt.'s mother was opposed to this plan and asked CSW advise pt to return home. Pt shared with CSW that he is actively hearing voices and has made a choice to be admitted. Pt will be transported by the sheriff to the hospital on 07/13/13.   [address]Aedon Deason, [place]WA [phone]

## 2013-07-13 NOTE — ED Notes (Signed)
Spoke with [employer] office.  They will be transporting [facility] to [facility] this morning. He was unable to give me a time but he did say it would take place this morning.

## 2013-07-13 NOTE — ED Provider Notes (Signed)
Pt is being transferred to a psychiatric hospital this am.    Vitals signs normal this am.  Labs reviewed.  Medically stable for transfer.  Celene KrasJon R Keiden Deskin, MD 07/13/13 [phone]

## 2013-07-13 NOTE — ED Notes (Signed)
Pt. Served with IVC papers per [employer].

## 2013-07-13 NOTE — ED Notes (Signed)
Report given to York GriceAmy Kilpatrick RN at [facility]. Sheriff has arrived.

## 2013-07-13 NOTE — ED Notes (Signed)
Pt states he does not want treatment at his placed facility because he has no ride. [facility] informed, Charge informed, MD informed. PT need for IVC revisitied, [facility] agrees to assess patient for IVC.

## 2013-08-01 ENCOUNTER — Encounter (HOSPITAL_COMMUNITY): Payer: Self-pay | Admitting: Emergency Medicine

## 2013-08-01 ENCOUNTER — Emergency Department (HOSPITAL_COMMUNITY)
Admission: EM | Admit: 2013-08-01 | Discharge: 2013-08-02 | Disposition: A | Discharge status: Other Acute Inpatient Hospital | Payer: Medicaid Other | Attending: Emergency Medicine | Admitting: Emergency Medicine

## 2013-08-01 DIAGNOSIS — R4689 Other symptoms and signs involving appearance and behavior: Secondary | ICD-10-CM

## 2013-08-01 DIAGNOSIS — F17200 Nicotine dependence, unspecified, uncomplicated: Secondary | ICD-10-CM | POA: Insufficient documentation

## 2013-08-01 DIAGNOSIS — F4310 Post-traumatic stress disorder, unspecified: Secondary | ICD-10-CM | POA: Insufficient documentation

## 2013-08-01 DIAGNOSIS — F209 Schizophrenia, unspecified: Secondary | ICD-10-CM | POA: Insufficient documentation

## 2013-08-01 DIAGNOSIS — R4589 Other symptoms and signs involving emotional state: Secondary | ICD-10-CM

## 2013-08-01 DIAGNOSIS — R45851 Suicidal ideations: Secondary | ICD-10-CM | POA: Insufficient documentation

## 2013-08-01 DIAGNOSIS — Z79899 Other long term (current) drug therapy: Secondary | ICD-10-CM | POA: Insufficient documentation

## 2013-08-01 DIAGNOSIS — F319 Bipolar disorder, unspecified: Secondary | ICD-10-CM | POA: Insufficient documentation

## 2013-08-01 LAB — COMPREHENSIVE METABOLIC PANEL
ALBUMIN: 4.1 g/dL (ref 3.5–5.2)
ALT: 27 U/L (ref 0–53)
AST: 24 U/L (ref 0–37)
Alkaline Phosphatase: 73 U/L (ref 39–117)
BUN: 13 mg/dL (ref 6–23)
CALCIUM: 9.8 mg/dL (ref 8.4–10.5)
CO2: 25 meq/L (ref 19–32)
CREATININE: 1.06 mg/dL (ref 0.50–1.35)
Chloride: 102 mEq/L (ref 96–112)
GFR calc Af Amer: >90 mL/min (ref >90)
GFR calc non Af Amer: 79 mL/min — ABNORMAL LOW (ref >90)
Glucose, Bld: 85 mg/dL (ref 70–99)
Potassium: 4.2 mEq/L (ref 3.7–5.3)
SODIUM: 141 meq/L (ref 137–147)
TOTAL PROTEIN: 7.7 g/dL (ref 6.0–8.3)
Total Bilirubin: 0.3 mg/dL (ref 0.3–1.2)

## 2013-08-01 LAB — CBC
HCT: 48.2 % (ref 39.0–52.0)
Hemoglobin: 16 g/dL (ref 13.0–17.0)
MCH: 29.6 pg (ref 26.0–34.0)
MCHC: 33.2 g/dL (ref 30.0–36.0)
MCV: 89.3 fL (ref 78.0–100.0)
PLATELETS: 213 10*3/uL (ref 150–400)
RBC: 5.4 MIL/uL (ref 4.22–5.81)
RDW: 12.7 % (ref 11.5–15.5)
WBC: 5.1 10*3/uL (ref 4.0–10.5)

## 2013-08-01 LAB — RAPID URINE DRUG SCREEN, HOSP PERFORMED
AMPHETAMINES: NOT DETECTED
BARBITURATES: NOT DETECTED
Benzodiazepines: NOT DETECTED
Cocaine: NOT DETECTED
Opiates: NOT DETECTED
Tetrahydrocannabinol: NOT DETECTED

## 2013-08-01 LAB — ACETAMINOPHEN LEVEL: Acetaminophen (Tylenol), Serum: <15 ug/mL (ref 10–30)

## 2013-08-01 LAB — ETHANOL: Alcohol, Ethyl (B): <11 mg/dL (ref 0–11)

## 2013-08-01 LAB — SALICYLATE LEVEL

## 2013-08-01 MED ORDER — ZOLPIDEM TARTRATE 5 MG PO TABS
10.0000 mg | ORAL_TABLET | Freq: Every day | ORAL | Status: DC
  Administered 2013-08-01: 10 mg via ORAL
  Filled 2013-08-01: qty 2

## 2013-08-01 MED ORDER — ACETAMINOPHEN 325 MG PO TABS: 650.0000 mg | ORAL_TABLET | ORAL | Status: DC | PRN

## 2013-08-01 MED ORDER — HYDROXYZINE HCL 25 MG PO TABS: 25.0000 mg | ORAL_TABLET | Freq: Three times a day (TID) | ORAL | Status: DC | PRN

## 2013-08-01 MED ORDER — HYDROXYZINE PAMOATE 25 MG PO CAPS
25.0000 mg | ORAL_CAPSULE | Freq: Three times a day (TID) | ORAL | Status: DC | PRN
  Filled 2013-08-01: qty 1

## 2013-08-01 MED ORDER — ZOLPIDEM TARTRATE 5 MG PO TABS: 5.0000 mg | ORAL_TABLET | Freq: Every evening | ORAL | Status: DC | PRN

## 2013-08-01 MED ORDER — ONDANSETRON HCL 4 MG PO TABS: 4.0000 mg | ORAL_TABLET | Freq: Three times a day (TID) | ORAL | Status: DC | PRN

## 2013-08-01 MED ORDER — NICOTINE 21 MG/24HR TD PT24
21.0000 mg | MEDICATED_PATCH | Freq: Every day | TRANSDERMAL | Status: DC
  Filled 2013-08-01 (×2): qty 1

## 2013-08-01 MED ORDER — LAMOTRIGINE 25 MG PO TABS
25.0000 mg | ORAL_TABLET | Freq: Every day | ORAL | Status: DC
  Administered 2013-08-01 – 2013-08-02 (×2): 25 mg via ORAL
  Filled 2013-08-01 (×2): qty 1

## 2013-08-01 MED ORDER — OLANZAPINE 5 MG PO TABS
5.0000 mg | ORAL_TABLET | Freq: Every day | ORAL | Status: DC
  Administered 2013-08-01: 5 mg via ORAL
  Filled 2013-08-01: qty 1

## 2013-08-01 NOTE — BH Assessment (Signed)
Tele assessment completed with patient at 8:45 PM.Consulted with Thurman CoyerEric Kaplan [facility] at 8:47 PM and Donell SievertSpencer Simon PA at 8:50 PM. Both agree patient meets criteria for inpatient hospitalization. [facility] as patient has meet therapeutic benefit of programming here due to multiple admits.  Plan is to [facility] once openings are available there as Psych ED is currently at capacity and to look for inpatient placement.  Carney Bernatherine C Harrill, LCSW

## 2013-08-01 NOTE — ED Notes (Signed)
Pt presents with suicidal thoughts of wanting to hurt himself by either cutting himself or jumping in front of traffic. Escorted by his sister. States he is taking zyprexa and lamictal and they don't seem to be working anymore. States he is hearing voices, demons, telling him to hurt himself and seeing his grandmother's spirit.

## 2013-08-01 NOTE — BH Assessment (Signed)
Spoke with patient's attending Dr Rhunette CroftNanavati who reports patient had psych admit two months ago and now feels his medications not working; history of Bipolar and Schizophrenia.  Contacted Pod C unit and they are on search for tele assessment unit. Agreed to Regulatory affairs officercall writer when located and installed in patient's room for use.  Carney Bernatherine C Stephaney Steven, LCSW
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
## 2013-08-02 NOTE — Progress Notes (Signed)
Met Patient at bedside.Role of CM explained.Patient reports he is in ED secondary to hearing voices.Patient reports his medications are not helping him. Patient reports he uses [facility] services but does not have a PCP.Patient has [place] access.CM provided patient with a PCP List  For the [place] area.Patient reports his understanding.Patient awaits placement.No further CM identified at this time.

## 2013-08-02 NOTE — ED Notes (Signed)
PT STATES HIS MEDS ARENT WORKING. STATES HE LIVES IN FEAR EVERY DAY THAT THEY WILL QUIT WORKING AND THINGS WILL BE BAD. HE STATES THE VOICES AND HALLUCINATIONS HAVE BEEN GETTING VERY BAD. STATES THE DOCTORS HAVE TALKED ABOUT DOING ECT AND HE STATES "AT THIS POINT WHAT DO I HAVE TO LOSE"

## 2013-08-02 NOTE — ED Notes (Signed)
SPOKE WITH ERIC AT [facility]. HE ADVISES THEY HAVE 3 OPEN BEDS AND WOULD BE HAPPY TO ACCEPT PT.  SPOKE TO JEN THE CHARGE NURSE AND SHE ADVISES THEY CANNOT ACCEPT BH TRANSFERS IF THEY HAVE 6 PSYCH PATIENTS IN THE UNIT.

## 2013-08-02 NOTE — ED Notes (Signed)
Pt has been [facility]. [facility]. She advises pt must be ivc

## 2013-08-02 NOTE — Progress Notes (Addendum)
Per, Darlyn ChamberMarcia White at [facility] [phone]) the patient has been accepted.   Writer informed the nurse working the patient.

## 2013-08-02 NOTE — Progress Notes (Signed)
The following facilities have been contacted on pt's behalf regarding inptx:  [facility]- per Margaret at capacity [facility]- per George at capacity [facility]- per Fran can fax, referral faxed [facility]- per Kayla at capacity [facility]- per Brent at capacity [facility]- per Gale beds available, referral faxed [facility]- referral faxed [facility]- per Teresa only 1 ger-psych male bed available [facility]- per Eric at capacity [facility]- referral faxed [facility]- no answer [facility]- per Nancy at capacity [facility]- per Kelly can fax, referral faxed [facility]- no answer [facility]- per Dennis voluntary only [facility]- per Latria at capacity   Windy Dudek Disposition [facility]  

## 2013-08-02 NOTE — ED Notes (Signed)
SPOKE TO SHERRIFF AGAIN ABOUT PT. HE IS AWARE THAT WE NOW HAVE PAPERS IN HAND. HE ADVISES THERE IS 1 TRANSPORT AHEAD OF THIS PT. STATES WILL PROBOBLY BE AFTER DINNER

## 2013-08-02 NOTE — Progress Notes (Addendum)
Per [facility] at [facility] pt has been declined d/t chronicity.  Per [place] [facility] d/t, "melingering", stated pt was just at their facility not long ago.  Tomi BambergerMariya Takao Lizer Disposition MHT

## 2013-08-02 NOTE — Progress Notes (Signed)
[facility]: @0900  Received a call from [place] inquiring if pt was still in the facility. Pt information would be submitted to Dr to determine acceptance.  Yurani Fettes Cathey, MHT

## 2013-08-02 NOTE — ED Notes (Signed)
Patient clothes and personal effects were placed in holding  Area, [facility].

## 2013-08-02 NOTE — ED Notes (Signed)
Sheriff here to pick up patient. 

## 2013-08-02 NOTE — ED Notes (Signed)
PER [facility] DR [name] WANTS PT TO HAVE A PSYCH EVAL TODAY.

## 2013-08-02 NOTE — Progress Notes (Signed)
[facility]: 1035 Spoke with Kennyth ArnoldStacy she wanted number to do a Engineer, civil (consulting)urse to Nurse . [phone] was given.  Praveen Coia Cathey, MHT

## 2013-08-02 NOTE — ED Provider Notes (Signed)
  Physical Exam  BP 114/67  Pulse 67  Temp(Src) 97.1 F (36.2 C) (Oral)  Resp 22  SpO2 98%  Physical Exam  ED Course  Procedures  [facility]. Will transfoer      Juliet RudeNathan R. Rubin PayorPickering, MD 08/02/13 1755

## 2013-08-11 NOTE — ED Provider Notes (Signed)
CSN: [id number]     Arrival date & time 08/01/13  1619 History   First MD Initiated Contact with Patient 08/01/13 1646     Chief Complaint  Patient presents with  . Suicidal     (Consider location/radiation/quality/duration/timing/severity/associated sxs/prior Treatment) HPI Comments: Pt with hx of depression, schizophrenia comes in with suicidal ideations. Pt is hearing voices, and having suicidal thoughts. He has tried OD in the past. Taking his meds as ordered. Pt denies nausea, emesis, fevers, chills, chest pains, shortness of breath, headaches, abdominal pain, uti like symptoms.   The history is provided by the patient.    Past Medical History  Diagnosis Date  . Depression   . Arthritis   . Bipolar 1 disorder   . PTSD (post-traumatic stress disorder)   . Schizophrenia    History reviewed. No pertinent past surgical history. No family history on file. History  Substance Use Topics  . Smoking status: Current Some Day Smoker -- 0.00 packs/day    Types: Cigarettes  . Smokeless tobacco: Not on file  . Alcohol Use: 0.0 oz/week     Comment: 07-11-13 PT REPORTS A HISTORY OF SUBSTANCE ABUSE    Review of Systems  Respiratory: Negative for chest tightness and shortness of breath.   Cardiovascular: Negative for chest pain.  Gastrointestinal: Negative for nausea, vomiting and abdominal pain.  Neurological: Negative for weakness.  Psychiatric/Behavioral: Positive for hallucinations and self-injury.      Allergies  Review of patient's allergies indicates no known allergies.  Home Medications   Current Outpatient Rx  Name  Route  Sig  Dispense  Refill  . hydrOXYzine (VISTARIL) 25 MG capsule   Oral   Take 25 mg by mouth 3 (three) times daily as needed for anxiety.         Marland Kitchen. ibuprofen (ADVIL,MOTRIN) 800 MG tablet   Oral   Take 800 mg by mouth every 8 (eight) hours as needed for moderate pain.         Marland Kitchen. lamoTRIgine (LAMICTAL) 25 MG tablet   Oral   Take 25 mg by  mouth daily.         Marland Kitchen. OLANZapine (ZYPREXA) 5 MG tablet   Oral   Take 5 mg by mouth at bedtime.         Marland Kitchen. zolpidem (AMBIEN) 10 MG tablet   Oral   Take 10 mg by mouth at bedtime.           BP 109/71  Pulse 87  Temp(Src) 99.3 F (37.4 C) (Oral)  Resp 20  SpO2 97% Physical Exam  Nursing note and vitals reviewed. Constitutional: He is oriented to person, place, and time. He appears well-developed.  HENT:  Head: Normocephalic and atraumatic.  Eyes: Conjunctivae and EOM are normal. Pupils are equal, round, and reactive to light.  Neck: Normal range of motion. Neck supple.  Cardiovascular: Normal rate and regular rhythm.   Pulmonary/Chest: Effort normal and breath sounds normal.  Abdominal: Soft. Bowel sounds are normal. He exhibits no distension. There is no tenderness. There is no rebound and no guarding.  Neurological: He is alert and oriented to person, place, and time.  Skin: Skin is warm.  Psychiatric: He has a normal mood and affect.    ED Course  Procedures (including critical care time) Labs Review Labs Reviewed  COMPREHENSIVE METABOLIC PANEL - Abnormal; Notable for the following:    GFR calc non Af Amer 79 (*)    All other components within normal limits  SALICYLATE  LEVEL - Abnormal; Notable for the following:    Salicylate Lvl <2.0 (*)    All other components within normal limits  ACETAMINOPHEN LEVEL  CBC  ETHANOL  URINE RAPID DRUG SCREEN (HOSP PERFORMED)   Imaging Review No results found.   EKG Interpretation None      MDM   Final diagnoses:  Suicidal behavior    Pt comes in with cc of SI. + hallucination and psychoses. Psych consulted, as patient is [place].  Derwood Kaplan, MD 08/11/13 [id number]

## 2013-10-23 ENCOUNTER — Encounter (HOSPITAL_COMMUNITY): Payer: Self-pay | Admitting: Emergency Medicine

## 2013-10-23 ENCOUNTER — Emergency Department (HOSPITAL_COMMUNITY)
Admission: EM | Admit: 2013-10-23 | Discharge: 2013-10-23 | Disposition: A | Discharge status: Home / Self Care | Payer: Medicaid Other | Attending: Emergency Medicine | Admitting: Emergency Medicine

## 2013-10-23 DIAGNOSIS — Z760 Encounter for issue of repeat prescription: Secondary | ICD-10-CM | POA: Insufficient documentation

## 2013-10-23 DIAGNOSIS — N478 Other disorders of prepuce: Secondary | ICD-10-CM | POA: Diagnosis not present

## 2013-10-23 DIAGNOSIS — N471 Phimosis: Secondary | ICD-10-CM

## 2013-10-23 DIAGNOSIS — F4310 Post-traumatic stress disorder, unspecified: Secondary | ICD-10-CM | POA: Diagnosis not present

## 2013-10-23 DIAGNOSIS — M129 Arthropathy, unspecified: Secondary | ICD-10-CM | POA: Insufficient documentation

## 2013-10-23 DIAGNOSIS — F209 Schizophrenia, unspecified: Secondary | ICD-10-CM | POA: Insufficient documentation

## 2013-10-23 DIAGNOSIS — F319 Bipolar disorder, unspecified: Secondary | ICD-10-CM | POA: Diagnosis not present

## 2013-10-23 DIAGNOSIS — Z79899 Other long term (current) drug therapy: Secondary | ICD-10-CM | POA: Diagnosis not present

## 2013-10-23 DIAGNOSIS — F17200 Nicotine dependence, unspecified, uncomplicated: Secondary | ICD-10-CM | POA: Insufficient documentation

## 2013-10-23 HISTORY — DX: Balanitis: N48.1

## 2013-10-23 MED ORDER — VALACYCLOVIR HCL 500 MG PO TABS: 500.0000 mg | ORAL_TABLET | Freq: Two times a day (BID) | ORAL | Status: DC

## 2013-10-23 NOTE — Discharge Instructions (Signed)
Please keep your scheduled follow up appointment at the urologist. Please take medications as prescribed. Please read all discharge instructions and return precautions.    Phimosis You or your child has been diagnosed as having phimosis. Phimosis is a tightening (constricting) of the foreskin over the head of the penis. In an uncircumcised male, the foreskin may be so tight that it cannot be easily pulled back over the head of the penis. This is common in young boys (up to 52 years old), but may occur at any age. As long as the child can pass urine, no treatment is needed immediately. This condition should improve by itself as he gets older. It may follow infection or injury, or occur from poor cleaning under the foreskin. Your caregiver may recommend circumcision (removal of part of the foreskin). These are individual preferences which can be decided upon between you and your caregiver. HOME CARE INSTRUCTIONS   Do not try to force back the foreskin. This may cause scarring and make the condition worse.  Clean under the foreskin regularly.  In uncircumcised babies, the foreskin is normally tight. It usually does not start to loosen enough to pull back until the baby is at least 52 months old. Until then, treat as your caregiver directs. Later, you may gently pull back the foreskin during bathing to wash the penis. SEEK MEDICAL CARE IF:   There is redness, swelling, or drainage from the foreskin. These are signs of infection.  You or your child has pain when passing urine.  An unexplained oral temperature above 102 F (38.9 C) develops. SEEK IMMEDIATE MEDICAL CARE IF:  Your child has not passed urine in 24 hours.  An unexplained oral temperature above 102 F (38.9 C) develops, not controlled by medication. Document Released: 05/09/2000 Document Revised: 08/04/2011 Document Reviewed: 10/04/2008 [facility] Patient Information 2014 [place], [place].  Medication Refill, Emergency Department We  have refilled your medication today as a courtesy to you. It is best for your medical care, however, to take care of getting refills done through your primary caregiver's office. They have your records and can do a better job of follow-up than we can in the emergency department. On maintenance medications, we often only prescribe enough medications to get you by until you are able to see your regular caregiver. This is a more expensive way to refill medications. In the future, please plan for refills so that you will not have to use the emergency department for this. Thank you for your help. Your help allows [place] to better take care of the daily emergencies that enter our department. Document Released: 08/29/2003 Document Revised: 08/04/2011 Document Reviewed: 05/12/2005 [facility] Patient Information 2014 [place], [place].

## 2013-10-23 NOTE — ED Notes (Signed)
Requesting Rx refill for Valtrex.  Pt states he was diagnosed with balanitis and needs medication refilled.  States he is unable to get appt with urologist for 2 months.

## 2013-10-23 NOTE — ED Provider Notes (Signed)
CSN: [id number]     Arrival date & time 10/23/13  1512 History   First MD Initiated Contact with Patient 10/23/13 1545     Chief Complaint  Patient presents with  . Medication Refill     (Consider location/radiation/quality/duration/timing/severity/associated sxs/prior Treatment) HPI Comments: Patient is a 53 yo M PMHx significant for depression, bipolar disorder, PTSD, Schizophrenia, balanitis, phimosis presenting to the ED requesting a refill on his valtrex for HSV outbreak that began two days ago. Patient also states he has been unable to be seen by the urologist for another two months for evaluation of his phimosis. He denies any difficulty urinating, painful urination, abdominal pain, nausea, vomiting, diarrhea, testicular or penile swelling or pain.    Past Medical History  Diagnosis Date  . Depression   . Arthritis   . Bipolar 1 disorder   . PTSD (post-traumatic stress disorder)   . Schizophrenia   . Balanitis    History reviewed. No pertinent past surgical history. No family history on file. History  Substance Use Topics  . Smoking status: Current Some Day Smoker -- 0.00 packs/day    Types: Cigarettes  . Smokeless tobacco: Not on file  . Alcohol Use: 0.0 oz/week     Comment: 07-11-13 PT REPORTS A HISTORY OF SUBSTANCE ABUSE    Review of Systems  Constitutional: Negative for fever and chills.  Genitourinary: Positive for genital sores. Negative for dysuria, urgency, frequency, hematuria, decreased urine volume, discharge, penile swelling, scrotal swelling, penile pain and testicular pain.  All other systems reviewed and are negative.     Allergies  Review of patient's allergies indicates no known allergies.  Home Medications   Prior to Admission medications   Medication Sig Start Date End Date Taking? Authorizing Provider  hydrOXYzine (VISTARIL) 25 MG capsule Take 25 mg by mouth 3 (three) times daily as needed for anxiety.   Yes Historical Provider, MD  ibuprofen  (ADVIL,MOTRIN) 800 MG tablet Take 800 mg by mouth every 8 (eight) hours as needed for moderate pain.   Yes Historical Provider, MD  lamoTRIgine (LAMICTAL) 25 MG tablet Take 25 mg by mouth daily.   Yes Historical Provider, MD  zolpidem (AMBIEN) 10 MG tablet Take 10 mg by mouth at bedtime.    Yes Historical Provider, MD  valACYclovir (VALTREX) 500 MG tablet Take 1 tablet (500 mg total) by mouth 2 (two) times daily. 10/23/13   Gissele Narducci L Nefertiti Mohamad, PA-C   BP 114/72  Pulse 106  Temp(Src) 98.5 F (36.9 C) (Oral)  Resp 16  Ht 6\' 3"  (1.905 m)  Wt 260 lb (117.935 kg)  BMI 32.50 kg/m2  SpO2 98% Physical Exam  Nursing note and vitals reviewed. Constitutional: He is oriented to person, place, and time. He appears well-developed and well-nourished. No distress.  HENT:  Head: Normocephalic and atraumatic.  Right Ear: External ear normal.  Left Ear: External ear normal.  Nose: Nose normal.  Mouth/Throat: Oropharynx is clear and moist.  Eyes: Conjunctivae are normal.  Neck: Normal range of motion. Neck supple.  Cardiovascular: Normal rate, regular rhythm and normal heart sounds.   Pulmonary/Chest: Effort normal and breath sounds normal. No respiratory distress.  Abdominal: Soft. There is no tenderness.  Genitourinary: Testes normal. Right testis shows no tenderness. Left testis shows no tenderness. Uncircumcised. Phimosis present. No paraphimosis, penile erythema or penile tenderness. No discharge found.  Two small genital sores noted.   Musculoskeletal: Normal range of motion.  Lymphadenopathy:       Right: No inguinal adenopathy  present.       Left: No inguinal adenopathy present.  Neurological: He is alert and oriented to person, place, and time.  Skin: Skin is warm and dry. He is not diaphoretic.  Psychiatric: He has a normal mood and affect.    ED Course  Procedures (including critical care time) Labs Review Labs Reviewed - No data to display  Imaging Review No results found.    EKG Interpretation None      MDM   Final diagnoses:  Phimosis  Medication refill    Filed Vitals:   10/23/13 1525  BP: 114/72  Pulse: 106  Temp: 98.5 F (36.9 C)  Resp: 16   Afebrile, NAD, non-toxic appearing, AAOx4. GU exam unremarkable for phimosis. No evidence of paraphimosis. 2 small vesicles consistent with HSV noted. Valtrex will be prescribed for recurrent breakout. Advised he should keep urology follow up for phimosis. Return precautions discussed. Patient agreeable to plan. Patient stable for discharge. Patient d/w with Dr. Gwendolyn GrantWalden, agrees with plan.       Jeannetta EllisJennifer L Basim Bartnik, PA-C 10/23/13 1629

## 2013-10-23 NOTE — ED Provider Notes (Signed)
Medical screening examination/treatment/procedure(s) were performed by non-physician practitioner and as supervising physician I was immediately available for consultation/collaboration.   EKG Interpretation None        William Adante Courington, MD 10/23/13 2334 

## 2013-10-25 LAB — COMPREHENSIVE METABOLIC PANEL
ALBUMIN: 4.4 g/dL (ref 3.4–5.0)
ALK PHOS: 86 U/L
AST: 167 U/L — AB (ref 15–37)
Anion Gap: 8 (ref 7–16)
BILIRUBIN TOTAL: 0.7 mg/dL (ref 0.2–1.0)
BUN: 14 mg/dL (ref 7–18)
CALCIUM: 8.9 mg/dL (ref 8.5–10.1)
CREATININE: 1.02 mg/dL (ref 0.60–1.30)
Chloride: 103 mmol/L (ref 98–107)
Co2: 26 mmol/L (ref 21–32)
EGFR (African American): >60
EGFR (Non-African Amer.): >60
Glucose: 103 mg/dL — ABNORMAL HIGH (ref 65–99)
OSMOLALITY: 275 (ref 275–301)
POTASSIUM: 3.7 mmol/L (ref 3.5–5.1)
SGPT (ALT): 57 U/L (ref 12–78)
SODIUM: 137 mmol/L (ref 136–145)
TOTAL PROTEIN: 8.6 g/dL — AB (ref 6.4–8.2)

## 2013-10-25 LAB — CBC
HCT: 46.6 % (ref 40.0–52.0)
HGB: 15.3 g/dL (ref 13.0–18.0)
MCH: 29.4 pg (ref 26.0–34.0)
MCHC: 32.8 g/dL (ref 32.0–36.0)
MCV: 90 fL (ref 80–100)
Platelet: 233 10*3/uL (ref 150–440)
RBC: 5.21 10*6/uL (ref 4.40–5.90)
RDW: 14.3 % (ref 11.5–14.5)
WBC: 8.4 10*3/uL (ref 3.8–10.6)

## 2013-10-25 LAB — SALICYLATE LEVEL: Salicylates, Serum: <1.7 mg/dL

## 2013-10-25 LAB — ETHANOL
ETHANOL LVL: 173 mg/dL
Ethanol %: 0.173 % — ABNORMAL HIGH (ref 0.000–0.080)

## 2013-10-25 LAB — ACETAMINOPHEN LEVEL: Acetaminophen: <2 ug/mL

## 2013-10-25 LAB — LITHIUM LEVEL

## 2013-10-26 LAB — URINALYSIS, COMPLETE
Bilirubin,UR: NEGATIVE
Glucose,UR: NEGATIVE mg/dL (ref 0–75)
Nitrite: NEGATIVE
Ph: 5 (ref 4.5–8.0)
Protein: 30
RBC,UR: 20 /HPF (ref 0–5)
Specific Gravity: 1.027 (ref 1.003–1.030)
WBC UR: 157 /HPF (ref 0–5)

## 2013-10-26 LAB — DRUG SCREEN, URINE
AMPHETAMINES, UR SCREEN: NEGATIVE (ref ?–1000)
Barbiturates, Ur Screen: NEGATIVE (ref ?–200)
Benzodiazepine, Ur Scrn: NEGATIVE (ref ?–200)
Cannabinoid 50 Ng, Ur ~~LOC~~: NEGATIVE (ref ?–50)
Cocaine Metabolite,Ur ~~LOC~~: POSITIVE (ref ?–300)
MDMA (Ecstasy)Ur Screen: NEGATIVE (ref ?–500)
Methadone, Ur Screen: NEGATIVE (ref ?–300)
Opiate, Ur Screen: NEGATIVE (ref ?–300)
Phencyclidine (PCP) Ur S: NEGATIVE (ref ?–25)
Tricyclic, Ur Screen: NEGATIVE (ref ?–1000)

## 2013-10-27 ENCOUNTER — Inpatient Hospital Stay: Payer: Self-pay | Admitting: Psychiatry

## 2013-10-28 LAB — URINALYSIS, COMPLETE
BILIRUBIN, UR: NEGATIVE
BLOOD: NEGATIVE
Glucose,UR: NEGATIVE mg/dL (ref 0–75)
KETONE: NEGATIVE
NITRITE: NEGATIVE
Ph: 7 (ref 4.5–8.0)
Protein: NEGATIVE
RBC,UR: 1 /HPF (ref 0–5)
SPECIFIC GRAVITY: 1.011 (ref 1.003–1.030)
Squamous Epithelial: 2
WBC UR: 10 /HPF (ref 0–5)

## 2013-11-09 ENCOUNTER — Emergency Department: Payer: Self-pay | Admitting: Emergency Medicine

## 2013-12-05 ENCOUNTER — Encounter (HOSPITAL_COMMUNITY): Payer: Self-pay | Admitting: Emergency Medicine

## 2013-12-05 ENCOUNTER — Emergency Department (HOSPITAL_COMMUNITY)
Admission: EM | Admit: 2013-12-05 | Discharge: 2013-12-12 | Disposition: A | Discharge status: Home / Self Care | Payer: Medicaid Other | Attending: Emergency Medicine | Admitting: Emergency Medicine

## 2013-12-05 DIAGNOSIS — F319 Bipolar disorder, unspecified: Secondary | ICD-10-CM | POA: Diagnosis not present

## 2013-12-05 DIAGNOSIS — F312 Bipolar disorder, current episode manic severe with psychotic features: Secondary | ICD-10-CM

## 2013-12-05 DIAGNOSIS — F3289 Other specified depressive episodes: Secondary | ICD-10-CM | POA: Insufficient documentation

## 2013-12-05 DIAGNOSIS — F1010 Alcohol abuse, uncomplicated: Secondary | ICD-10-CM | POA: Insufficient documentation

## 2013-12-05 DIAGNOSIS — F1410 Cocaine abuse, uncomplicated: Secondary | ICD-10-CM

## 2013-12-05 DIAGNOSIS — T1491XA Suicide attempt, initial encounter: Secondary | ICD-10-CM

## 2013-12-05 DIAGNOSIS — F1994 Other psychoactive substance use, unspecified with psychoactive substance-induced mood disorder: Secondary | ICD-10-CM | POA: Diagnosis present

## 2013-12-05 DIAGNOSIS — F4310 Post-traumatic stress disorder, unspecified: Secondary | ICD-10-CM | POA: Diagnosis not present

## 2013-12-05 DIAGNOSIS — R45851 Suicidal ideations: Secondary | ICD-10-CM

## 2013-12-05 DIAGNOSIS — F329 Major depressive disorder, single episode, unspecified: Secondary | ICD-10-CM | POA: Insufficient documentation

## 2013-12-05 DIAGNOSIS — F209 Schizophrenia, unspecified: Secondary | ICD-10-CM | POA: Insufficient documentation

## 2013-12-05 DIAGNOSIS — F1420 Cocaine dependence, uncomplicated: Secondary | ICD-10-CM | POA: Diagnosis present

## 2013-12-05 LAB — COMPREHENSIVE METABOLIC PANEL
ALBUMIN: 3.8 g/dL (ref 3.5–5.2)
ALT: 33 U/L (ref 0–53)
AST: 31 U/L (ref 0–37)
Alkaline Phosphatase: 90 U/L (ref 39–117)
Anion gap: 15 (ref 5–15)
BILIRUBIN TOTAL: 0.3 mg/dL (ref 0.3–1.2)
BUN: 12 mg/dL (ref 6–23)
CHLORIDE: 101 meq/L (ref 96–112)
CO2: 24 meq/L (ref 19–32)
CREATININE: 1.12 mg/dL (ref 0.50–1.35)
Calcium: 9.2 mg/dL (ref 8.4–10.5)
GFR calc Af Amer: 86 mL/min — ABNORMAL LOW (ref >90)
GFR, EST NON AFRICAN AMERICAN: 74 mL/min — AB (ref >90)
Glucose, Bld: 101 mg/dL — ABNORMAL HIGH (ref 70–99)
Potassium: 3.7 mEq/L (ref 3.7–5.3)
Sodium: 140 mEq/L (ref 137–147)
Total Protein: 7.5 g/dL (ref 6.0–8.3)

## 2013-12-05 LAB — CBC
HCT: 44.2 % (ref 39.0–52.0)
Hemoglobin: 14.7 g/dL (ref 13.0–17.0)
MCH: 30 pg (ref 26.0–34.0)
MCHC: 33.3 g/dL (ref 30.0–36.0)
MCV: 90.2 fL (ref 78.0–100.0)
PLATELETS: 220 10*3/uL (ref 150–400)
RBC: 4.9 MIL/uL (ref 4.22–5.81)
RDW: 14.1 % (ref 11.5–15.5)
WBC: 6.3 10*3/uL (ref 4.0–10.5)

## 2013-12-05 LAB — SALICYLATE LEVEL: Salicylate Lvl: <2 mg/dL — ABNORMAL LOW (ref 2.8–20.0)

## 2013-12-05 LAB — ACETAMINOPHEN LEVEL: Acetaminophen (Tylenol), Serum: <15 ug/mL (ref 10–30)

## 2013-12-05 LAB — ETHANOL: ALCOHOL ETHYL (B): 52 mg/dL — AB (ref 0–11)

## 2013-12-05 MED ORDER — VITAMIN B-1 100 MG PO TABS
100.0000 mg | ORAL_TABLET | Freq: Every day | ORAL | Status: DC
  Administered 2013-12-06 – 2013-12-12 (×6): 100 mg via ORAL
  Filled 2013-12-05 (×6): qty 1

## 2013-12-05 MED ORDER — THIAMINE HCL 100 MG/ML IJ SOLN: 100.0000 mg | Freq: Every day | INTRAMUSCULAR | Status: DC

## 2013-12-05 MED ORDER — LORAZEPAM 1 MG PO TABS
0.0000 mg | ORAL_TABLET | Freq: Four times a day (QID) | ORAL | Status: AC
  Administered 2013-12-05: 2 mg via ORAL
  Administered 2013-12-06: 1 mg via ORAL
  Administered 2013-12-06: 2 mg via ORAL
  Administered 2013-12-06 – 2013-12-07 (×2): 1 mg via ORAL
  Filled 2013-12-05: qty 1
  Filled 2013-12-05: qty 2
  Filled 2013-12-05: qty 1
  Filled 2013-12-05: qty 2
  Filled 2013-12-05: qty 1

## 2013-12-05 MED ORDER — LORAZEPAM 1 MG PO TABS
0.0000 mg | ORAL_TABLET | Freq: Two times a day (BID) | ORAL | Status: AC
  Administered 2013-12-08: 1 mg via ORAL
  Filled 2013-12-05: qty 1

## 2013-12-05 NOTE — ED Provider Notes (Signed)
CSN: [id number]     Arrival date & time 12/05/13  2100 History   First MD Initiated Contact with Patient 12/05/13 2145     Chief Complaint  Patient presents with  . Suicidal  . Alcohol Problem     (Consider location/radiation/quality/duration/timing/severity/associated sxs/prior Treatment) HPI Comments: And 53 year old male with history of bipolar, cocaine, alcohol abuse, suicide attempt in the past presents with suicidal ideation and attempt.  Patient laid on the road in front of a car prior to arrival with plans of killing himself. Patient has been drinking hard liquor this morning and drinks fairly regularly. Patient has been having mild visual hallucinations but her grandmothers.. Patient does want help and wants to feel better as she has not taken his lithium for bipolar medicines for 3 weeks. Nothing improves his symptoms and thoughts. Patient has been inpatient in the past   Patient is a 53 y.o. male presenting with alcohol problem. The history is provided by the patient.  Alcohol Problem Pertinent negatives include no chest pain, no abdominal pain, no headaches and no shortness of breath.    Past Medical History  Diagnosis Date  . Depression   . Arthritis   . Bipolar 1 disorder   . PTSD (post-traumatic stress disorder)   . Schizophrenia   . Balanitis    History reviewed. No pertinent past surgical history. No family history on file. History  Substance Use Topics  . Smoking status: Current Some Day Smoker -- 0.00 packs/day    Types: Cigarettes  . Smokeless tobacco: Not on file  . Alcohol Use: 0.0 oz/week     Comment: 07-11-13 PT REPORTS A HISTORY OF SUBSTANCE ABUSE    Review of Systems  Constitutional: Negative for fever and chills.  HENT: Negative for congestion.   Eyes: Negative for visual disturbance.  Respiratory: Negative for shortness of breath.   Cardiovascular: Negative for chest pain.  Gastrointestinal: Negative for vomiting and abdominal pain.   Genitourinary: Negative for dysuria and flank pain.  Musculoskeletal: Negative for back pain, neck pain and neck stiffness.  Skin: Negative for rash.  Neurological: Negative for light-headedness and headaches.  Psychiatric/Behavioral: Positive for suicidal ideas and self-injury.      Allergies  Review of patient's allergies indicates no known allergies.  Home Medications   Prior to Admission medications   Medication Sig Start Date End Date Taking? Authorizing Provider  hydrOXYzine (VISTARIL) 25 MG capsule Take 25 mg by mouth 3 (three) times daily as needed for anxiety.    Historical Provider, MD  ibuprofen (ADVIL,MOTRIN) 800 MG tablet Take 800 mg by mouth every 8 (eight) hours as needed for moderate pain.    Historical Provider, MD  lamoTRIgine (LAMICTAL) 25 MG tablet Take 25 mg by mouth daily.    Historical Provider, MD  valACYclovir (VALTREX) 500 MG tablet Take 1 tablet (500 mg total) by mouth 2 (two) times daily. 10/23/13   Jennifer L Piepenbrink, PA-C  zolpidem (AMBIEN) 10 MG tablet Take 10 mg by mouth at bedtime.     Historical Provider, MD   BP 98/60  Pulse 88  Temp(Src) 99.1 F (37.3 C) (Oral)  Resp 16  SpO2 97% Physical Exam  Nursing note and vitals reviewed. Constitutional: He is oriented to person, place, and time. He appears well-developed and well-nourished.  HENT:  Head: Normocephalic and atraumatic.  Eyes: Conjunctivae are normal. Right eye exhibits no discharge. Left eye exhibits no discharge.  Neck: Normal range of motion. Neck supple. No tracheal deviation present.  Cardiovascular: Normal  rate and regular rhythm.   Pulmonary/Chest: Effort normal and breath sounds normal.  Abdominal: Soft. He exhibits no distension. There is no tenderness. There is no guarding.  Musculoskeletal: He exhibits no edema.  Neurological: He is alert and oriented to person, place, and time. No cranial nerve deficit.  Skin: Skin is warm. No rash noted.  Psychiatric: His speech is not  rapid and/or pressured. He expresses suicidal ideation. He expresses suicidal plans. He expresses no homicidal plans.  Flat affect persistent suicidal ideation    ED Course  Procedures (including critical care time) Labs Review Labs Reviewed  COMPREHENSIVE METABOLIC PANEL - Abnormal; Notable for the following:    Glucose, Bld 101 (*)    GFR calc non Af Amer 74 (*)    GFR calc Af Amer 86 (*)    All other components within normal limits  ETHANOL - Abnormal; Notable for the following:    Alcohol, Ethyl (B) 52 (*)    All other components within normal limits  SALICYLATE LEVEL - Abnormal; Notable for the following:    Salicylate Lvl <2.0 (*)    All other components within normal limits  CBC  ACETAMINOPHEN LEVEL  URINE RAPID DRUG SCREEN (HOSP PERFORMED)    Imaging Review No results found.   EKG Interpretation None      MDM   Final diagnoses:  Suicidal ideation  Suicide attempt   patient with worsening SI thoughts and be off medicines for 3 weeks and had a jump in front of a car today. Patient is voluntary and would like help. His been inpatient in the past and psychiatric workup and behavior health consult placed. Patient is mild withdrawal symptoms currently at this time does not need medical admission prior to psychiatric placement.  Patient will be signed out pending behavior health recommendations however patient will require inpatient treatment for my clinical standpoint.  Filed Vitals:   12/05/13 2110 12/05/13 2220  BP: 130/75 98/60  Pulse: 102 88  Temp: 98.7 F (37.1 C) 99.1 F (37.3 C)  TempSrc: Oral Oral  Resp: 18 16  SpO2: 97% 97%       Enid SkeensJoshua M Tassie Pollett, MD 12/05/13 2338

## 2013-12-05 NOTE — ED Notes (Signed)
Pt reports SI thoughts x 3 days, with plan of "jumping in front of a car." States he has been on 3 days drinking binge, drinking hard liquor. Pt also reports that he had hx of PTSD, anxiety and has been out of medication for 3 weeks. Reports auditory/visual hallucinations that "tell me to cut a wrist and get a gun. I see my grandma's spirit sometimes." Pt is calm, cooperative at this time. Reports marijuana use 3 days ago, but denies any other drugs.

## 2013-12-05 NOTE — ED Notes (Signed)
Discuss with Dr. Jodi MourningZavitz  Of the cwia protocol doctor order 2mg  po ativan now and reassess in hour.

## 2013-12-06 LAB — RAPID URINE DRUG SCREEN, HOSP PERFORMED
Amphetamines: NOT DETECTED
BARBITURATES: NOT DETECTED
Benzodiazepines: NOT DETECTED
Cocaine: POSITIVE — AB
Opiates: NOT DETECTED
TETRAHYDROCANNABINOL: NOT DETECTED

## 2013-12-06 MED ORDER — ONDANSETRON HCL 4 MG PO TABS
4.0000 mg | ORAL_TABLET | Freq: Four times a day (QID) | ORAL | Status: DC | PRN
  Administered 2013-12-06: 4 mg via ORAL
  Filled 2013-12-06: qty 1

## 2013-12-06 MED ORDER — ACETAMINOPHEN 500 MG PO TABS
1000.0000 mg | ORAL_TABLET | Freq: Four times a day (QID) | ORAL | Status: DC | PRN
  Administered 2013-12-06 – 2013-12-11 (×4): 1000 mg via ORAL
  Filled 2013-12-06 (×4): qty 2

## 2013-12-06 NOTE — BH Assessment (Signed)
Contact has been made with the following facilities regarding inpatient bed placement.   [facility]- Per Nicholos JohnsKathleen @ capacity. [facility] Tracey [facility]capacity . [facility] Phyllis @capacity . [facility] Della @capacity . [facility]Per Brian @capacity . [facility]-Per Melissa@capacity . [facility]-Per Miranda @capacity . [facility]-Per Cindy @capacity . [facility]- Per Lois  @capacity  [facility]- left a voice message. No return call back.  [facility] [place]- Per [facility] [employer] beds available and referral information has been faxed for review. Per Shanda BumpsJessica she can't confirm if referral was received and would like to be called back later on the status of faxed referral.  [facility]- Per Thayer Ohmhris beds available referrral information faxed for review. Per Thayer Ohmhris information received and under review.  [facility]-Per [facility] beds available and referral information has been faxed for review. Per Cordelia PenSherry information received and is under review.  [facility]- Per [facility] they are accepting patient's to wait list. [facility] for review.   Glorious PeachNajah Talyn Dessert, MS, LCASA Assessment Counselor

## 2013-12-06 NOTE — ED Notes (Addendum)
Asked Pt. About his meds. States he has been on meds for so that he is tired of it that is why he just wanted to lay down in the street and be done with it. He stated that they have tried Risperdal and Zyprexa for his schizophrenia but they didn't stop them only made him sleepy. They tried Seroquel but it made him sick. He is on Lamictal for depression but it really doesn't help. He was on Lithium for a while but his hands began to shake so bad he had to stop. He can't take haldol for the same reason. [facility]. Admits to [facility] telling him to kill himself. Has not taken his meds in 3 weeks.
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
## 2013-12-06 NOTE — BH Assessment (Signed)
Per Toniann FailWendy at [facility] @ 1630  patient has been placed on wait list.  Glorious PeachNajah Danasia Baker, MS, LCASA Assessment Counselor

## 2013-12-06 NOTE — BH Assessment (Signed)
Reviewed Dr. Jodi MourningZavitz assessment note and contacted Zella Ballobin, RN to set up TA equipment.   TA to commence shortly.   Clista BernhardtNancy Harryette Shuart, [facility] Triage Specialist 12/06/2013 5:11 AM

## 2013-12-06 NOTE — BH Assessment (Signed)
Relayed results of the TA wil Donell SievertSpencer Simon, [place]. Pt meets inpatient criteria and needs 400 bed. [facility]. TTS will seek placement if EDP is agreeable.   Spoke with Dr. Judd Lienelo, EDP to discuss results of TA and recommendations for level of care. Dr. Judd Lienelo is agreeable with inpt recommendation and TTS seeking placement.   Clista BernhardtNancy Syreeta Figler, [facility] Triage Specialist 12/06/2013 5:36 AM

## 2013-12-06 NOTE — BH Assessment (Signed)
Followed Up with Gala Romneyoug at [facility] who reports they did not receive initial fax from TTS regarding patient referral.This Clinical research associatewriter re-faxed referral information to [facility] for review.   Glorious PeachNajah Reis Goga, MS, LCASA Assessment Counselor

## 2013-12-06 NOTE — BH Assessment (Signed)
Follow-up with referring facilities:  [facility]-Per Shanda BumpsJessica pt declined due to capacity-no beds. [facility]-Per Cordelia PenSherry pt declined d/t SA use. [facility]- Per Thayer Ohmhris pt declined by Dr. Hinton DyerPucilowski due to Chronic Malingering related symptoms, pt not appropriate for their unit.   Glorious PeachNajah Joncarlos Atkison, MS, LCASA Assessment Counselor

## 2013-12-07 DIAGNOSIS — F332 Major depressive disorder, recurrent severe without psychotic features: Secondary | ICD-10-CM

## 2013-12-07 DIAGNOSIS — R45851 Suicidal ideations: Secondary | ICD-10-CM

## 2013-12-07 MED ORDER — LAMOTRIGINE 25 MG PO TABS
25.0000 mg | ORAL_TABLET | Freq: Two times a day (BID) | ORAL | Status: DC
  Administered 2013-12-07 – 2013-12-12 (×10): 25 mg via ORAL
  Filled 2013-12-07 (×11): qty 1

## 2013-12-07 MED ORDER — HYDROXYZINE HCL 25 MG PO TABS
50.0000 mg | ORAL_TABLET | Freq: Every evening | ORAL | Status: DC | PRN
  Administered 2013-12-07: 50 mg via ORAL
  Filled 2013-12-07: qty 2

## 2013-12-07 MED ORDER — ZOLPIDEM TARTRATE 5 MG PO TABS
5.0000 mg | ORAL_TABLET | Freq: Every evening | ORAL | Status: DC | PRN
  Administered 2013-12-07 – 2013-12-11 (×5): 5 mg via ORAL
  Filled 2013-12-07 (×5): qty 1

## 2013-12-07 MED ORDER — LAMOTRIGINE 5 MG PO CHEW
25.0000 mg | CHEWABLE_TABLET | Freq: Two times a day (BID) | ORAL | Status: DC
  Filled 2013-12-07 (×2): qty 5

## 2013-12-07 NOTE — ED Notes (Signed)
TTS complete 

## 2013-12-07 NOTE — ED Notes (Signed)
[facility] called about transport.

## 2013-12-07 NOTE — ED Notes (Signed)
[facility] Expected date:  Expected time:  Means of arrival:  Comments: [facility]

## 2013-12-07 NOTE — ED Provider Notes (Signed)
Patient evaluated by act/behavioral health. They request transfer to [facility] ED. Medication reviewed. Per Dr. [name] would like him to reinitiate his Lamictal. [facility]. [facility]. Accepted in transfer.  Rolland PorterMark Aerielle Stoklosa, MD 12/07/13 [phone]
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
## 2013-12-07 NOTE — ED Notes (Signed)
TTS in progress 

## 2013-12-07 NOTE — ED Notes (Signed)
telepsych set up at bedside 
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
## 2013-12-07 NOTE — ED Notes (Signed)
Pt requesting tylenol for soreness.

## 2013-12-07 NOTE — BH Assessment (Signed)
[facility] Assessment Progress Note  [facility] [name], RN, [facility]; he has reached maximum therapeutic benefit at this facility.  [facility], per [name].  Perry Fiddleraige McLean reports that [name].  Will pursue [facility] placement.  [name], MA Triage Specialist 12/07/2013 @ 14:24

## 2013-12-07 NOTE — Progress Notes (Signed)
[facility] Pt. Alert and oriented warm and dry in no acute distress. Pt. States he is depressed, has no support system and would try to kill himself given the chance. Pt. Also c/o some chronic arthritis pain in his right  Knee. Pt. Asking for meds for his pain, anxiety and insomnia. Pt. States he usually takes Ambien and vistaril for sleep and anxiety. Pt. Encouraged to let nursing staff know if he has concerns and needs.

## 2013-12-08 ENCOUNTER — Encounter (HOSPITAL_COMMUNITY): Payer: Self-pay | Admitting: Registered Nurse

## 2013-12-08 DIAGNOSIS — F1994 Other psychoactive substance use, unspecified with psychoactive substance-induced mood disorder: Secondary | ICD-10-CM | POA: Diagnosis present

## 2013-12-08 MED ORDER — HYDROXYZINE HCL 25 MG PO TABS
25.0000 mg | ORAL_TABLET | Freq: Two times a day (BID) | ORAL | Status: DC | PRN
Start: 2013-12-08 — End: 2013-12-12
  Administered 2013-12-08 – 2013-12-11 (×6): 25 mg via ORAL
  Filled 2013-12-08 (×8): qty 1

## 2013-12-08 MED ORDER — HYDROCORTISONE 1 % EX CREA
TOPICAL_CREAM | Freq: Three times a day (TID) | CUTANEOUS | Status: DC
  Administered 2013-12-08 – 2013-12-12 (×11): via TOPICAL
  Filled 2013-12-08: qty 28

## 2013-12-08 NOTE — ED Notes (Signed)
Up in the hall talking to the CSW

## 2013-12-08 NOTE — ED Notes (Signed)
Up to the bathroom 
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
## 2013-12-08 NOTE — ED Notes (Signed)
Pt. Alert and oriented warm and dry in no acute distress. Pt. states he is still hearing voices telling him to shoot himself. He says the AH is spirits and noise. Pt. Encouraged to let nursing staff know if he has concerns or needs.

## 2013-12-08 NOTE — ED Notes (Signed)
Pt up in the hall, upset that [facility] or not transferred to another hospital.  Pt reports that he need "at least" a week to get back on his meds.  Pt stated that if they tried to DC him he was '":going to call my momma... Go lay in the street..."  Pt reminded that he is not leaving today, and that he will talk to the psychiatrist aging in the morning.

## 2013-12-08 NOTE — ED Notes (Signed)
Up walking in the hall 

## 2013-12-08 NOTE — Progress Notes (Signed)
CSW put in care coordinator referral with clinician Raquel Sarna. Raquel Sarna stated a request had put in previously, but had been denied due to lack of sufficient hospitalization.  Raquel Sarna stated that he now met criteria, and she would expedite this request, since pt is at risk for homelessness. Raquel Sarna stated we should expect a call back from [facility] within 24 hours.   Rochele Pages,     ED CSW  phone: [phone] 3:07pm

## 2013-12-08 NOTE — [facility] Counselor (Signed)
Writer left voicemail for [facility]erie [facility] [phone].

## 2013-12-08 NOTE — ED Provider Notes (Signed)
The patient is transferred from outside hospital with complaint of worsening hallucinations and suicidal thoughts with increased alcohol use this week. At this time the patient is sleeping, easily arousable, answers my questions appropriately and does not appear to be responding to internal stimuli, he will be seen by psychiatry today for medical management of his underlying bipolar disorder, schizophrenia or psychosis. He has no complaints at this time.  Vida RollerBrian D Monque Haggar, MD 12/08/13 [phone]

## 2013-12-08 NOTE — Consult Note (Signed)
[place] Face to Face Psychiatry Consultation   Reason for Consult:  Reassess Referring Physician:  ED MD   Assessment: AXIS I:  Major Depression, Recurrent severe AXIS II:  Deferred AXIS III:   Past Medical History  Diagnosis Date  . Depression   . Arthritis   . Bipolar 1 disorder   . PTSD (post-traumatic stress disorder)   . Schizophrenia   . Balanitis    AXIS IV:  housing problems, occupational problems, problems related to social environment, problems with access to health care services and problems with primary support group AXIS V:  51-60 moderate symptoms  Plan:  Recommend psychiatric Inpatient admission when medically cleared.  Subjective:    HPI:   [name] is a 53 y.o. male transferred to [facility] from [facility] with complaints of suicidal ideation and plan to step into traffic.  Patient states "I was in [place] and ran out of my medication.  When I got back about 3-4 days ago I started seeing stuff and hearing voices.  The police took me to [facility] when I walked in front of a car and laid in the road. I just need to get back on my medication.  I didn't start my medication until I got here"  Patient continues to endorse suicidal ideation.  Discussed with patient the his medication would be restarted and he would be monitored 2-3 days and then discharged.  Also informed patient that a list of resources for long term treatment would be given to him and he would need to start calling while he was here in the hospital.  Also informed patient that he would need to go to [facility] to follow up to prevent running out of medications.   Patient frequently visits the ED with complaints of suicidal ideation, auditory/visual hallucinations.  Patient is noncompliant with outpatient treatment and medications.  Patient continues to do illicit drugs (cocaine) and drink alcohol.    Below is assessment form [facility] on initial assessment.   Long history of mental illness, cocaine abuse, alcohol  abuse, medication non-compliant, and malingering. ASHAD FAWBUSH is an 53 y.o. male.who is well known to ED and [facility] staff presented to ED on 7/13 c/o SI with plans to die via lying in the road in front of cars. He has been off of his psych medication per report for several weels, has continued to abuse alcohol and cocaine. He was recommended for in patient admission, but thus far has been declined by all facilities who are at capacity. Today Del[place]i states he is not doing well and is still very depressed and suicidal. He says he must get into a hospital as he can not return to his mother's home until after he is hospitalized. He says he knows that if he leaves the hospital today he will be laying right back down in the street and trying to die. patient admitted with reports of depression with plans to kill himself by laying down in the street. He states he has stopped all his psych meds, Lamictal, vistaril and Ambien because they weren't working anymore. He says he has tried a number of medications in the past and had some bad reactions to medications such as haldol and Risperdal.  He used Lithium for years successfully but can no longer take it as it causes tremors.Marland Kitchen   HPI Elements:   Location:  WLED. Quality:  chronic. Severity:  mild to moderate. Timing:  worsening over the past 3 weeks. Duration:  This  episode since the 4th of July when he went out of town.. Context:  Patient states he can not return to his mother&amp;#39;s home until he is admitted somewhere as he does not feel safe because they have guns and knives there.. Review of Systems  Constitutional: Negative for diaphoresis.  Gastrointestinal: Negative for abdominal pain.  Musculoskeletal: Negative.   Neurological: Negative for tremors and headaches.  Psychiatric/Behavioral: Positive for depression, suicidal ideas and substance abuse. The patient is not nervous/anxious.   History reviewed. No pertinent family history.   Past  Psychiatric History: Past Medical History  Diagnosis Date  . Depression   . Arthritis   . Bipolar 1 disorder   . PTSD (post-traumatic stress disorder)   . Schizophrenia   . Balanitis     reports that he has been smoking Cigarettes.  He has been smoking about 0.00 packs per day. He does not have any smokeless tobacco history on file. He reports that he drinks alcohol. He reports that he uses illicit drugs (Marijuana). History reviewed. No pertinent family history. Family History Substance Abuse: No Family Supports:  (reports none currently) Living Arrangements: Parent;Other (Comment) (staying in hotel 4 days, usually lives with mom) Can pt return to current living arrangement?: Yes (reports family is upset with him due to suicide attempts) Allergies:  No Known Allergies  ACT Assessment Complete:  Yes:    Educational Status    Risk to Self: Risk to self Suicidal Ideation: Yes-Currently Present Suicidal Intent: Yes-Currently Present Is patient at risk for suicide?: Yes Suicidal Plan?: Yes-Currently Present Specify Current Suicidal Plan: Laid in street. Reports 3 attempts in past year, overdosed, cut wrist Access to Means: Yes Specify Access to Suicidal Means: pills, cars, knives What has been your use of drugs/alcohol within the last 12 months?: Pt has been drinking heavily the past 4 days. He reports three cases of beer in past 3 days. Pt reports he may have used cocaine and marijuana too but does not remember. Hx of drug and alcohol use on and off since teens that increases as symptoms worsen.  Previous Attempts/Gestures: Yes How many times?: 3 Other Self Harm Risks: reports for past year he has been cutting his legs and face Triggers for Past Attempts: Hallucinations Intentional Self Injurious Behavior: Cutting Comment - Self Injurious Behavior: cutting on legs and face for the past year Family Suicide History: No Recent stressful life event(s):  (off medications, family  conflicts due to multiple attempts) Persecutory voices/beliefs?: Yes (feels  people are out to get him) Depression: Yes Depression Symptoms: Despondent;Insomnia;Tearfulness;Isolating;Fatigue;Guilt;Loss of interest in usual pleasures;Feeling worthless/self pity;Feeling angry/irritable Substance abuse history and/or treatment for substance abuse?: Yes Suicide prevention information given to non-admitted patients: Not applicable (will be admitted)  Risk to Others: Risk to Others Homicidal Ideation: No Thoughts of Harm to Others: No Current Homicidal Intent: No Current Homicidal Plan: No Access to Homicidal Means: No Identified Victim: none History of harm to others?: No Assessment of Violence: None Noted Violent Behavior Description: "just towards myself" Does patient have access to weapons?: No Criminal Charges Pending?: No Does patient have a court date: No  Abuse: Abuse/Neglect Assessment (Assessment to be complete while patient is alone) Physical Abuse: Denies Verbal Abuse: Yes, present (Comment) (Reports mom is verbally abusive to him regarding his illness. Reports family is upset over his SAs this past year. ) Sexual Abuse: Denies Exploitation of patient/patient's resources: Denies Self-Neglect: Yes, present (Comment) (reports that he has not eaten the past three days due to stomach  pain)  Prior Inpatient Therapy: Prior Inpatient Therapy Prior Inpatient Therapy: Yes Prior Therapy Dates: multiple over the years Prior Therapy Facilty/Provider(s): [facility], [facility] Reason for Treatment: Si, psychoisis SA  Prior Outpatient Therapy: Prior Outpatient Therapy Prior Outpatient Therapy: Yes Prior Therapy Dates: current Prior Therapy Facilty/Provider(s): [facility] Reason for Treatment: medication management  Additional Information: Additional Information 1:1 In Past 12 Months?: No CIRT Risk: No Elopement Risk: No Does patient have medical clearance?: Yes    Objective: Blood pressure  113/72, pulse 71, temperature 97.6 F (36.4 C), temperature source Oral, resp. rate 18, SpO2 100.00%.There is no weight on file to calculate BMI. Results for orders placed during the hospital encounter of 12/05/13 (from the past 72 hour(s))  CBC     Status: None   Collection Time    12/05/13  9:31 PM      Result Value Ref Range   WBC 6.3  4.0 - 10.5 K/uL   RBC 4.90  4.22 - 5.81 MIL/uL   Hemoglobin 14.7  13.0 - 17.0 g/dL   HCT 44.2  39.0 - 52.0 %   MCV 90.2  78.0 - 100.0 fL   MCH 30.0  26.0 - 34.0 pg   MCHC 33.3  30.0 - 36.0 g/dL   RDW 14.1  11.5 - 15.5 %   Platelets 220  150 - 400 K/uL  COMPREHENSIVE METABOLIC PANEL     Status: Abnormal   Collection Time    12/05/13  9:31 PM      Result Value Ref Range   Sodium 140  137 - 147 mEq/L   Potassium 3.7  3.7 - 5.3 mEq/L   Chloride 101  96 - 112 mEq/L   CO2 24  19 - 32 mEq/L   Glucose, Bld 101 (*) 70 - 99 mg/dL   BUN 12  6 - 23 mg/dL   Creatinine, Ser 1.12  0.50 - 1.35 mg/dL   Calcium 9.2  8.4 - 10.5 mg/dL   Total Protein 7.5  6.0 - 8.3 g/dL   Albumin 3.8  3.5 - 5.2 g/dL   AST 31  0 - 37 U/L   ALT 33  0 - 53 U/L   Alkaline Phosphatase 90  39 - 117 U/L   Total Bilirubin 0.3  0.3 - 1.2 mg/dL   GFR calc non Af Amer 74 (*) >90 mL/min   GFR calc Af Amer 86 (*) >90 mL/min   Comment: (NOTE)     The eGFR has been calculated using the CKD EPI equation.     This calculation has not been validated in all clinical situations.     eGFR's persistently <90 mL/min signify possible Chronic Kidney     Disease.   Anion gap 15  5 - 15  ETHANOL     Status: Abnormal   Collection Time    12/05/13  9:31 PM      Result Value Ref Range   Alcohol, Ethyl (B) 52 (*) 0 - 11 mg/dL   Comment:            LOWEST DETECTABLE LIMIT FOR     SERUM ALCOHOL IS 11 mg/dL     FOR MEDICAL PURPOSES ONLY  ACETAMINOPHEN LEVEL     Status: None   Collection Time    12/05/13  9:31 PM      Result Value Ref Range   Acetaminophen (Tylenol), Serum <15.0  10 - 30 ug/mL    Comment:  THERAPEUTIC CONCENTRATIONS VARY     SIGNIFICANTLY. A RANGE OF 10-30     ug/mL MAY BE AN EFFECTIVE     CONCENTRATION FOR MANY PATIENTS.     HOWEVER, SOME ARE BEST TREATED     AT CONCENTRATIONS OUTSIDE THIS     RANGE.     ACETAMINOPHEN CONCENTRATIONS     >150 ug/mL AT 4 HOURS AFTER     INGESTION AND >50 ug/mL AT 12     HOURS AFTER INGESTION ARE     OFTEN ASSOCIATED WITH TOXIC     REACTIONS.  SALICYLATE LEVEL     Status: Abnormal   Collection Time    12/05/13  9:31 PM      Result Value Ref Range   Salicylate Lvl <8.7 (*) 2.8 - 20.0 mg/dL  URINE RAPID DRUG SCREEN (HOSP PERFORMED)     Status: Abnormal   Collection Time    12/05/13  9:46 PM      Result Value Ref Range   Opiates NONE DETECTED  NONE DETECTED   Cocaine POSITIVE (*) NONE DETECTED   Benzodiazepines NONE DETECTED  NONE DETECTED   Amphetamines NONE DETECTED  NONE DETECTED   Tetrahydrocannabinol NONE DETECTED  NONE DETECTED   Barbiturates NONE DETECTED  NONE DETECTED   Comment:            DRUG SCREEN FOR MEDICAL PURPOSES     ONLY.  IF CONFIRMATION IS NEEDED     FOR ANY PURPOSE, NOTIFY LAB     WITHIN 5 DAYS.                LOWEST DETECTABLE LIMITS     FOR URINE DRUG SCREEN     Drug Class       Cutoff (ng/mL)     Amphetamine      1000     Barbiturate      200     Benzodiazepine   564     Tricyclics       332     Opiates          300     Cocaine          300     THC              50   Labs are reviewed and are pertinent for positive for cocaine, BAL 52..  Current Facility-Administered Medications  Medication Dose Route Frequency Provider Last Rate Last Dose  . acetaminophen (TYLENOL) tablet 1,000 mg  1,000 mg Oral Q6H PRN Veryl Speak, MD   1,000 mg at 12/07/13 0755  . LORazepam (ATIVAN) tablet 0-4 mg  0-4 mg Oral 4 times per day Mariea Clonts, MD   1 mg at 12/07/13 1232   Followed by  . [START ON 12/08/2013] LORazepam (ATIVAN) tablet 0-4 mg  0-4 mg Oral Q12H Mariea Clonts, MD      .  ondansetron [facility]) tablet 4 mg  4 mg Oral Q6H PRN Veryl Speak, MD   4 mg at 12/06/13 0655  . thiamine (VITAMIN B-1) tablet 100 mg  100 mg Oral Daily Mariea Clonts, MD   100 mg at 12/07/13 9518   Current Outpatient Prescriptions  Medication Sig Dispense Refill  . hydrOXYzine (VISTARIL) 25 MG capsule Take 25 mg by mouth 3 (three) times daily as needed for anxiety.      Marland Kitchen ibuprofen (ADVIL,MOTRIN) 800 MG tablet Take 800 mg by mouth every 8 (eight) hours as needed for moderate pain.      Marland Kitchen  lamoTRIgine (LAMICTAL) 25 MG tablet Take 25 mg by mouth daily.      . valACYclovir (VALTREX) 500 MG tablet Take 1 tablet (500 mg total) by mouth 2 (two) times daily.  6 tablet  0  . zolpidem (AMBIEN) 10 MG tablet Take 10 mg by mouth at bedtime.         Psychiatric Specialty Exam:     Blood pressure 113/72, pulse 71, temperature 97.6 F (36.4 C), temperature source Oral, resp. rate 18, SpO2 100.00%.There is no weight on file to calculate BMI.  General Appearance: Disheveled  Eye Contact::  Good  Speech:  Clear and Coherent  Volume:  Normal  Mood:  Depressed  Affect:  Congruent  Thought Process:  Goal Directed, Linear and Logical  Orientation:  Full (Time, Place, and Person)  Thought Content:  Hallucinations: Auditory but does not appear to be responding to internal stimulation.  Suicidal Thoughts:  Yes.  with intent/plan  Homicidal Thoughts:  No  Memory:  NA  Judgement:  Impaired  Insight:  Shallow  Psychomotor Activity:  Normal  Concentration:  Fair  Recall:  Fair  Akathisia:  No  Handed:    AIMS (if indicated):     Assets:  Communication Skills Desire for Improvement  Sleep:      Treatment Plan Summary: Start home medications. Monitor patient fro 2-3 days on medication then discharge home to follow up with primary outpatient psych provider [facility]).  Give patient resources for outpatient substance abuse.  Encourage patient to continue contacting long term rehab facilities while here in  hospital and once discharged.    Shuvon Rankin, FNP-BC 2:48 PM 12/08/2013  Patient seen for a face-to-face psychiatric evaluation and examination along with the physician assistant, case discussed and formulated at appropriate treatment plan.Reviewed the information documented and agree with the treatment plan.  Sabino Denning,[name] 12/08/2013 5:23 PM

## 2013-12-08 NOTE — BHH Counselor (Signed)
[name] pt still on their waitlist. There will be no male discharges today. [name] reports wait list isn't numbered.  [name], [place] Assessment Counselor

## 2013-12-08 NOTE — ED Notes (Signed)
Dr j and shuvon into see 

## 2013-12-08 NOTE — ED Notes (Addendum)
Pt. C/o balanitis flair. Affected area visualized and EDP called for consult. EDP ordered hydrocortisone cream. Pt. Made aware of same.

## 2013-12-09 ENCOUNTER — Encounter (HOSPITAL_COMMUNITY): Payer: Self-pay | Admitting: Psychiatry

## 2013-12-09 MED ORDER — LAMOTRIGINE 25 MG PO TABS: 25.0000 mg | ORAL_TABLET | Freq: Two times a day (BID) | ORAL | Status: DC

## 2013-12-09 NOTE — Progress Notes (Addendum)
CSW met with pt to discuss potential drug treatment programs.  Pt called [facility]. [facility] staff member spoke with this CSW. [facility] staff member told CSW that an intake appointment might be available this afternoon. Staff member stated she would call CSW at 1pm to confirm appointment availability.   Pt called also [facility]. CSW spoke with Yolonda Kida, program manager of their long term residential program. Kashis Penley [place] emailed a blank application to [facility]. Carrico stated that if pt met program requirements, a bed might be available on Monday. CSW and pt completed application, and CSW faxed application.   CSW to continue to follow.  Rochele Pages,     ED CSW  phone: [phone] 11:14am _______  CSW made referral to [facility] ACT team. Per Tim, they are reviewing referral to schedule an assessment.  CSW reached out [facility] ACT team--they are not accepting pts at this time.  Rochele Pages,     ED CSW  phone: [phone] 1:53am

## 2013-12-09 NOTE — Progress Notes (Signed)
CSW received a call from Julaine FusiLou Carrico, program manager [phone] with concerns about the patient's ability to come to the facility with medications.  CSW was informed that the patient will not be sent with medications and this was passed on to [facility].  He was still concerned about the lack of medications.  CSW reassured the [facility] StagersChaplin that this decision was made a psychiatrist and other outpatient referrals were made for follow-up.  He reports that he would need to call back will further instructions from his administrators before a decision could be made.    CSW spoke with the extender who reported that the decision to send the patient without medications were based some miscommunication.  The extender is able to get the patient up to one month supply of medication to send with him to the facility and CSW called to leave a message with the [facility] Stagers[facility]plin stating that new information.  CSW is awaiting a return call from the [facility] to confirm a bed.      Maryelizabeth Rowanressa Jaci Desanto, MSW, [place] Valley[place]CSWA, 12/09/2013  Evening Clinical Social Worker [phone]

## 2013-12-09 NOTE — ED Notes (Signed)
Crackers and soft drink given at pts. Request.

## 2013-12-09 NOTE — Progress Notes (Signed)
  CARE MANAGEMENT ED NOTE 12/09/2013  Patient:  [name],[name]   Account Number:  [id number]  Date Initiated:  12/09/2013  Documentation initiated by:  Radford PaxFERRERO,Dori Devino  Subjective/Objective Assessment:   Patient presents to Ed with SI and AH     Subjective/Objective Assessment Detail:   Patient with pmhx of Depression, Bipolar, PTSD, Schizophrenia, arthritis     Action/Plan:   Action/Plan Detail:   Anticipated DC Date:       Status Recommendation to Physician:   Result of Recommendation:    Other ED Services  Consult Working Plan    DC Planning Services  CM consult  Other  Medication Assistance  PCP issues    Choice offered to / List presented to:            Status of service:  Completed, signed off  ED Comments:   ED Comments Detail:  [facility] for [facility] assistance.EDCM spoke to patient at bedside.  Patient confirms he has Medicaid insurnace.  Patient is aware of three dollar copay for medications. Patient reports he does not have a pcp.  [facility] provided patient with a list of pcps who accept Medicaid and self pay patients, list of discounted pharmacies and websites needymeds.org and Good https://figueroa.info/X.com for medication assistance, financial resources in the communiy such as [employer], local churches, urban ministries.  EDCM also [facility] patient [facility].  EDCM explained to patient that [facility] from [facility]-Thurs from 9am-1030am.  [facility] informed patient that he may establish care there, receive assistance with medications, speak to a financial counselor or social worker if needed.  EDCM asked patient if he is homeless? Patient stated, "No, I can stay with my mama."  EDCM discussed patient with EDNP.  No further EDCM needs at this time.

## 2013-12-09 NOTE — Progress Notes (Signed)
CSW received a call from [place] [facility] confirming that the patient care coordination referral was accepted and his care coordinator will be Remigio EisenmengerKaren Rudd [phone].     Maryelizabeth Rowanressa Margart Zemanek, MSW, [place]CSWA, 12/09/2013 Evening Clinical Social Worker [phone]

## 2013-12-09 NOTE — Consult Note (Signed)
[facility]   Reason for Consult:  Depression with suicidal ideations Referring Physician:  EDP  Perry DollyVincent Y [name] is an 53 y.o. male. Total Time spent with patient: 20 minutes  Assessment: AXIS I:  Bipolar, Depressed AXIS II:  Deferred AXIS III:   Past Medical History  Diagnosis Date  . Depression   . Arthritis   . Bipolar 1 disorder   . PTSD (post-traumatic stress disorder)   . Schizophrenia   . Balanitis    AXIS IV:  economic problems, housing problems, other psychosocial or environmental problems, problems related to social environment and problems with primary support group AXIS V:  21-30 behavior considerably influenced by delusions or hallucinations OR serious impairment in judgment, communication OR inability to function in almost all areas  Plan:  Recommend psychiatric Inpatient admission when medically cleared.  Dr. [name] assessed the patient and concurs with the plan.  Subjective:   [name] is a 53 y.o. male patient admitted with depression and suicidal ideations.  HPI:  Patient states he is suicidal because he has not been taking his bipolar medications and has been abusing alcohol and cocaine.  He states he was in [place] and did not have his medications for the past month and became depressed with suicidal ideations.  His mother "kicked" him out of the [name] and he has no place to live.  [name] and he can [facility] Monday but does not feel safe to leave until his medications are therapeutic.  [name] feels he will be fine to go Monday.  Denies hallucinations and homicidal ideations.   HPI Elements:   Location:  generalized. Quality:  acute. Severity:  severe. Timing:  constant. Duration:  few days. Context:  stressors.  Past Psychiatric History: Past Medical History  Diagnosis Date  . Depression   . Arthritis   . Bipolar 1 disorder   . PTSD (post-traumatic stress disorder)   . Schizophrenia   . Balanitis      reports that he has been smoking Cigarettes.  He has been smoking about 0.00 packs per day. He does not have any smokeless tobacco history on file. He reports that he drinks alcohol. He reports that he uses illicit drugs (Marijuana). History reviewed. No pertinent family history. Family History Substance Abuse: No Family Supports:  (reports none currently) Living Arrangements: Parent;Other (Comment) (staying in hotel 4 days, usually lives with mom) Can pt return to current living arrangement?: Yes (reports family is upset with him due to suicide attempts) Abuse/Neglect [facility] Physical Abuse: Denies Verbal Abuse: Yes, present (Comment) (Reports mom is verbally abusive to him regarding his illness. Reports family is upset over his SAs this past year. ) Sexual Abuse: Denies Allergies:  No Known Allergies  ACT Assessment Complete:  Yes:    Educational Status    Risk to Self: Risk to self Suicidal Ideation: Yes-Currently Present Suicidal Intent: Yes-Currently Present Is patient at risk for suicide?: Yes Suicidal Plan?: Yes-Currently Present Specify Current Suicidal Plan: Laid in street. Reports 3 attempts in past year, overdosed, cut wrist Access to Means: Yes Specify Access to Suicidal Means: pills, cars, knives What has been your use of drugs/alcohol within the last 12 months?: Pt has been drinking heavily the past 4 days. He reports three cases of beer in past 3 days. Pt reports he may have used cocaine and marijuana too but does not remember. Hx of drug and alcohol use on and off since teens that increases as symptoms worsen.  Previous Attempts/Gestures: Yes How many times?: 3 Other Self Harm Risks: reports for past year he has been cutting his legs and face Triggers for Past Attempts: Hallucinations Intentional Self Injurious Behavior: Cutting Comment - Self Injurious Behavior: cutting on legs and face for the past year Family Suicide History: No Recent stressful life event(s):  (off  medications, family conflicts due to multiple attempts) Persecutory voices/beliefs?: Yes (feels  people are out to get him) Depression: Yes Depression Symptoms: Despondent;Insomnia;Tearfulness;Isolating;Fatigue;Guilt;Loss of interest in usual pleasures;Feeling worthless/self pity;Feeling angry/irritable Substance abuse history and/or treatment for substance abuse?: Yes Suicide prevention information given to non-admitted patients: Not applicable (will be admitted)  Risk to Others: Risk to Others Homicidal Ideation: No Thoughts of Harm to Others: No Current Homicidal Intent: No Current Homicidal Plan: No Access to Homicidal Means: No Identified Victim: none History of harm to others?: No Assessment of Violence: None Noted Violent Behavior Description: "just towards myself" Does patient have access to weapons?: No Criminal Charges Pending?: No Does patient have a court date: No  Abuse: Abuse/Neglect Assessment (Assessment to be complete while patient is alone) Physical Abuse: Denies Verbal Abuse: Yes, present (Comment) (Reports mom is verbally abusive to him regarding his illness. Reports family is upset over his SAs this past year. ) Sexual Abuse: Denies Exploitation of patient/patient's resources: Denies Self-Neglect: Yes, present (Comment) (reports that he has not eaten the past three days due to stomach pain)  Prior Inpatient Therapy: Prior Inpatient Therapy Prior Inpatient Therapy: Yes Prior Therapy Dates: multiple over the years Prior Therapy Facilty/Provider(s): [facility] Reason for Treatment: Si, psychoisis SA  Prior Outpatient Therapy: Prior Outpatient Therapy Prior Outpatient Therapy: Yes Prior Therapy Dates: current Prior Therapy Facilty/Provider(s): [facility] Reason for Treatment: medication management  Additional Information: Additional Information 1:1 In Past 12 Months?: No CIRT Risk: No Elopement Risk: No Does patient have medical clearance?: Yes                   Objective: Blood pressure 116/73, pulse 78, temperature 97.5 F (36.4 C), temperature source Oral, resp. rate 16, SpO2 100.00%.There is no weight on file to calculate BMI.No results found for this or any previous visit (from the past 72 hour(s)). Labs are reviewed and are pertinent for no medical issues.  Current Facility-Administered Medications  Medication Dose Route Frequency Provider Last Rate Last Dose  . acetaminophen (TYLENOL) tablet 1,000 mg  1,000 mg Oral Q6H PRN Geoffery Lyons, MD   1,000 mg at 12/07/13 2110  . hydrocortisone cream 1 %   Topical TID Dagmar Hait, MD      . hydrOXYzine (ATARAX/VISTARIL) tablet 25 mg  25 mg Oral BID PRN Shuvon Rankin, NP   25 mg at 12/09/13 0923  . lamoTRIgine (LAMICTAL) tablet 25 mg  25 mg Oral BID Rolland Porter, MD   25 mg at 12/09/13 0917  . LORazepam (ATIVAN) tablet 0-4 mg  0-4 mg Oral Q12H Enid Skeens, MD   1 mg at 12/08/13 1031  . ondansetron (ZOFRAN) tablet 4 mg  4 mg Oral Q6H PRN Geoffery Lyons, MD   4 mg at 12/06/13 0655  . thiamine (VITAMIN B-1) tablet 100 mg  100 mg Oral Daily Enid Skeens, MD   100 mg at 12/08/13 1029  . zolpidem (AMBIEN) tablet 5 mg  5 mg Oral QHS PRN Rolland Porter, MD   5 mg at 12/08/13 2141   Current Outpatient Prescriptions  Medication Sig Dispense Refill  . hydrOXYzine (VISTARIL) 25 MG capsule Take  25 mg by mouth 3 (three) times daily as needed for anxiety.      Marland Kitchen ibuprofen (ADVIL,MOTRIN) 800 MG tablet Take 800 mg by mouth every 8 (eight) hours as needed for moderate pain.      Marland Kitchen lamoTRIgine (LAMICTAL) 25 MG tablet Take 25 mg by mouth daily.      . valACYclovir (VALTREX) 500 MG tablet Take 1 tablet (500 mg total) by mouth 2 (two) times daily.  6 tablet  0  . zolpidem (AMBIEN) 10 MG tablet Take 10 mg by mouth at bedtime.         Psychiatric Specialty Exam:     Blood pressure 116/73, pulse 78, temperature 97.5 F (36.4 C), temperature source Oral, resp. rate 16, SpO2  100.00%.There is no weight on file to calculate BMI.  General Appearance: Casual  Eye Contact::  Fair  Speech:  Slow  Volume:  Normal  Mood:  Depressed and Hopeless  Affect:  Congruent  Thought Process:  Coherent  Orientation:  Full (Time, Place, and Person)  Thought Content:  WDL  Suicidal Thoughts:  Yes with plan and intention  Homicidal Thoughts:  No  Memory:  Immediate;   Fair Recent;   Fair Remote;   Fair  Judgement:  Poor  Insight:  Fair  Psychomotor Activity:  Decreased  Concentration:  Fair  Recall:  [employer] of Knowledge:Fair  Language: Fair  Akathisia:  No  Handed:  Right  AIMS (if indicated):     Assets:  Financial Resources/Insurance Physical Health Resilience  Sleep:      Musculoskeletal: Strength & Muscle Tone: within normal limits Gait & Station: normal Patient leans: N/A  Treatment Plan Summary: Daily contact with patient to assess and evaluate symptoms and progress in treatment Medication management; admit to inpatient for stabilization or until his medications are therapeutic  Nanine Means, PMH-NP 12/09/2013 1:38 PM

## 2013-12-10 DIAGNOSIS — F1420 Cocaine dependence, uncomplicated: Secondary | ICD-10-CM

## 2013-12-10 NOTE — Progress Notes (Signed)
[facility]  12/10/2013 11:18 AM [name]  MRN:  [id number] Subjective:  Patient was seen and evaluated this am on rounds.  Patient reports he is doing well and that his medications are beginning to work for him.  Patient states he regrets not staying back [place].  He stated that his move to [place] to join a call friend made him lose his treatment and medications.  He denies SI/HI/AVH, [place]  [facility].  Diagnosis:  Bipolar Disorder, Cocaine dependence DSM5: Schizophrenia Disorders:  NA Obsessive-Compulsive Disorders:  NA Trauma-Stressor Disorders:  NA Substance/Addictive Disorders:   Depressive Disorders:  Bipolar disorder Total Time spent with patient: 30 minutes  Axis I: Bipolar, Depressed and substance abuse Axis II: Deferred Axis III:  Past Medical History  Diagnosis Date  . Depression   . Arthritis   . Bipolar 1 disorder   . PTSD (post-traumatic stress disorder)   . Schizophrenia   . Balanitis    Axis IV: housing problems, occupational problems, other psychosocial or environmental problems and problems related to social environment Axis V: 41-50 serious symptoms  ADL's:  Intact  Sleep: Good  Appetite:  Good  Suicidal Ideation:  Denied Homicidal Ideation:  denied  Psychiatric Specialty Exam: Physical Exam  ROS  Blood pressure 98/59, pulse 78, temperature 98 F (36.7 C), temperature source Oral, resp. rate 14, SpO2 100.00%.There is no weight on file to calculate BMI.  General Appearance: Casual  Eye Contact::  Good  Speech:  Clear and Coherent  Volume:  Normal  Mood:  Anxious  Affect:  Congruent  Thought Process:  Coherent, Goal Directed and Intact  Orientation:  Full (Time, Place, and Person)  Thought Content:  WDL  Suicidal Thoughts:  No  Homicidal Thoughts:  No  Memory:  Immediate;    Good Recent;   Good Remote;   Good  Judgement:  Fair  Insight:  Good  Psychomotor Activity:  Normal  Concentration:  Good  Recall:  NA  Fund of Knowledge:Good  Language: Good  Akathisia:  NA  Handed:  Right  AIMS (if indicated):     Assets:  Desire for Improvement  Sleep:      Musculoskeletal: Strength & Muscle Tone: within normal limits Gait & Station: normal Patient leans: N/A  Current Medications: Current Facility-Administered Medications  Medication Dose Route Frequency Provider Last Rate Last Dose  . acetaminophen (TYLENOL) tablet 1,000 mg  1,000 mg Oral Q6H PRN Geoffery Lyonsouglas Delo, MD   1,000 mg at 12/07/13 2110  . hydrocortisone cream 1 %   Topical TID Dagmar HaitWilliam Blair Walden, MD      . hydrOXYzine (ATARAX/VISTARIL) tablet 25 mg  25 mg Oral BID PRN Shuvon Rankin, NP   25 mg at 12/10/13 1008  . lamoTRIgine (LAMICTAL) tablet 25 mg  25 mg Oral BID Rolland PorterMark James, MD   25 mg at 12/10/13 0951  . ondansetron (ZOFRAN) tablet 4 mg  4 mg Oral Q6H PRN Geoffery Lyonsouglas Delo, MD   4 mg at 12/06/13 0655  . thiamine (VITAMIN B-1) tablet 100 mg  100 mg Oral Daily Enid SkeensJoshua M Zavitz, MD   100 mg at 12/10/13 0951  . zolpidem (AMBIEN) tablet 5 mg  5 mg Oral QHS PRN Rolland PorterMark James, MD   5 mg at 12/09/13 2113   Current Outpatient Prescriptions  Medication Sig Dispense Refill  .  hydrOXYzine (VISTARIL) 25 MG capsule Take 25 mg by mouth 3 (three) times daily as needed for anxiety.      Marland Kitchen ibuprofen (ADVIL,MOTRIN) 800 MG tablet Take 800 mg by mouth every 8 (eight) hours as needed for moderate pain.      Marland Kitchen lamoTRIgine (LAMICTAL) 25 MG tablet Take 25 mg by mouth daily.      Marland Kitchen lamoTRIgine (LAMICTAL) 25 MG tablet Take 1 tablet (25 mg total) by mouth 2 (two) times daily.  60 tablet  0  . valACYclovir (VALTREX) 500 MG tablet Take 1 tablet (500 mg total) by mouth 2 (two) times daily.  6 tablet  0  . zolpidem (AMBIEN) 10 MG tablet Take 10 mg by mouth at bedtime.         Lab Results: No results found for this or any previous visit  (from the past 48 hour(s)).  Physical Findings: AIMS:  , ,  ,  ,    CIWA:  CIWA-Ar Total: 0 COWS:     Treatment Plan Summary: Daily contact with patient to assess and evaluate symptoms and progress in treatment Medication management  Plan: Continue with plan of care Continue crisis management Continue medication management/ and review as needed Continue taking Lamictal 25 mg po bid  For mood Hydroxyzine 25 po bid as needed for anxiety Address health issues /V/S as needed  Continue plan to go to 90 days treatment at [facility] on Monday.   Medical Decision Making Problem Points:  Established problem, stable/improving (1) Data Points:  Review and summation of old records (2) Review of medication regiment & side effects (2)  I certify that inpatient services furnished can reasonably be expected to improve the patient's condition.   Dahlia Byes, C   PMHNP-BC 12/10/2013, 11:18 AM Patient seen and I agree with assessment and plan Diannia Ruder MD

## 2013-12-10 NOTE — ED Notes (Signed)
Up at the desk talking w/ CSW

## 2013-12-10 NOTE — ED Notes (Signed)
Up to the bathroom 

## 2013-12-11 ENCOUNTER — Encounter (HOSPITAL_COMMUNITY): Payer: Self-pay | Admitting: Psychiatry

## 2013-12-11 DIAGNOSIS — F3130 Bipolar disorder, current episode depressed, mild or moderate severity, unspecified: Secondary | ICD-10-CM

## 2013-12-11 MED ORDER — IBUPROFEN 200 MG PO TABS
600.0000 mg | ORAL_TABLET | Freq: Four times a day (QID) | ORAL | Status: DC | PRN
  Administered 2013-12-11: 600 mg via ORAL
  Filled 2013-12-11: qty 3

## 2013-12-11 MED ORDER — VALACYCLOVIR HCL 500 MG PO TABS
500.0000 mg | ORAL_TABLET | Freq: Two times a day (BID) | ORAL | Status: DC
  Administered 2013-12-11 – 2013-12-12 (×3): 500 mg via ORAL
  Filled 2013-12-11 (×4): qty 1

## 2013-12-11 NOTE — ED Notes (Signed)
eatting supper, watching tv

## 2013-12-11 NOTE — Consult Note (Signed)
[facility] Face-to-Face Psychiatry Consult   Reason for Consult:  Depression with suicidal ideations Referring Physician:  EDP  [name] is an 53 y.o. male. Total Time spent with patient: 20 minutes  Assessment: AXIS I:  Bipolar, Depressed AXIS II:  Deferred AXIS III:   Past Medical History  Diagnosis Date  . Depression   . Arthritis   . Bipolar 1 disorder   . PTSD (post-traumatic stress disorder)   . Schizophrenia   . Balanitis    AXIS IV:  economic problems, housing problems, other psychosocial or environmental problems, problems related to social environment and problems with primary support group AXIS V:  21-30 behavior considerably influenced by delusions or hallucinations OR serious impairment in judgment, communication OR inability to function in almost all areas  Plan:  Recommend psychiatric Inpatient admission when medically cleared.  Dr. Tenny Craw assessed the patient and concurs with the plan.  Subjective:   [name] is a 53 y.o. male patient admitted with depression and suicidal ideations.  HPI:  Patient states he is feeling better and feels his medications are working.  He would like to go to the [facility] tomorrow if he continues to stabilize.  Passive suicidal ideations but no intent, hallucinations have dissipated. HPI Elements:   Location:  generalized. Quality:  acute. Severity:  severe. Timing:  constant. Duration:  few days. Context:  stressors.  Past Psychiatric History: Past Medical History  Diagnosis Date  . Depression   . Arthritis   . Bipolar 1 disorder   . PTSD (post-traumatic stress disorder)   . Schizophrenia   . Balanitis     reports that he has been smoking Cigarettes.  He has been smoking about 0.00 packs per day. He does not have any smokeless tobacco history on file. He reports that he drinks alcohol. He reports that he uses illicit drugs (Marijuana). History reviewed. No pertinent family history. Family  History Substance Abuse: No Family Supports:  (reports none currently) Living Arrangements: Parent;Other (Comment) (staying in hotel 4 days, usually lives with mom) Can pt return to current living arrangement?: Yes (reports family is upset with him due to suicide attempts) Abuse/Neglect [facility]) Physical Abuse: Denies Verbal Abuse: Yes, present (Comment) (Reports mom is verbally abusive to him regarding his illness. Reports family is upset over his SAs this past year. ) Sexual Abuse: Denies Allergies:  No Known Allergies  ACT Assessment Complete:  Yes:    Educational Status    Risk to Self: Risk to self Suicidal Ideation: Yes-Currently Present Suicidal Intent: Yes-Currently Present Is patient at risk for suicide?: Yes Suicidal Plan?: Yes-Currently Present Specify Current Suicidal Plan: Laid in street. Reports 3 attempts in past year, overdosed, cut wrist Access to Means: Yes Specify Access to Suicidal Means: pills, cars, knives What has been your use of drugs/alcohol within the last 12 months?: Pt has been drinking heavily the past 4 days. He reports three cases of beer in past 3 days. Pt reports he may have used cocaine and marijuana too but does not remember. Hx of drug and alcohol use on and off since teens that increases as symptoms worsen.  Previous Attempts/Gestures: Yes How many times?: 3 Other Self Harm Risks: reports for past year he has been cutting his legs and face Triggers for Past Attempts: Hallucinations Intentional Self Injurious Behavior: Cutting Comment - Self Injurious Behavior: cutting on legs and face for the past year Family Suicide History: No Recent stressful life event(s):  (off medications, family conflicts  due to multiple attempts) Persecutory voices/beliefs?: Yes (feels  people are out to get him) Depression: Yes Depression Symptoms: Despondent;Insomnia;Tearfulness;Isolating;Fatigue;Guilt;Loss of interest in usual pleasures;Feeling worthless/self pity;Feeling  angry/irritable Substance abuse history and/or treatment for substance abuse?: Yes Suicide prevention information given to non-admitted patients: Not applicable (will be admitted)  Risk to Others: Risk to Others Homicidal Ideation: No Thoughts of Harm to Others: No Current Homicidal Intent: No Current Homicidal Plan: No Access to Homicidal Means: No Identified Victim: none History of harm to others?: No Assessment of Violence: None Noted Violent Behavior Description: "just towards myself" Does patient have access to weapons?: No Criminal Charges Pending?: No Does patient have a court date: No  Abuse: Abuse/Neglect Assessment (Assessment to be complete while patient is alone) Physical Abuse: Denies Verbal Abuse: Yes, present (Comment) (Reports mom is verbally abusive to him regarding his illness. Reports family is upset over his SAs this past year. ) Sexual Abuse: Denies Exploitation of patient/patient's resources: Denies Self-Neglect: Yes, present (Comment) (reports that he has not eaten the past three days due to stomach pain)  Prior Inpatient Therapy: Prior Inpatient Therapy Prior Inpatient Therapy: Yes Prior Therapy Dates: multiple over the years [facility] Reason for Treatment: Si, psychoisis SA  Prior Outpatient Therapy: Prior Outpatient Therapy Prior Outpatient Therapy: Yes Prior Therapy Dates: current Prior Therapy Facilty/Provider(s): [facility] Reason for Treatment: medication management  Additional Information: Additional Information 1:1 In Past 12 Months?: No CIRT Risk: No Elopement Risk: No Does patient have medical clearance?: Yes                  Objective: Blood pressure 102/64, pulse 82, temperature 98.4 F (36.9 C), temperature source Oral, resp. rate 20, SpO2 96.00%.There is no weight on file to calculate BMI.No results found for this or any previous visit (from the past 72 hour(s)). Labs are reviewed and are  pertinent for no medical issues.  Current Facility-Administered Medications  Medication Dose Route Frequency Provider Last Rate Last Dose  . acetaminophen (TYLENOL) tablet 1,000 mg  1,000 mg Oral Q6H PRN Geoffery Lyonsouglas Delo, MD   1,000 mg at 12/11/13 0939  . hydrocortisone cream 1 %   Topical TID Dagmar HaitWilliam Blair Walden, MD      . hydrOXYzine (ATARAX/VISTARIL) tablet 25 mg  25 mg Oral BID PRN Shuvon Rankin, NP   25 mg at 12/11/13 0939  . lamoTRIgine (LAMICTAL) tablet 25 mg  25 mg Oral BID Rolland PorterMark James, MD   25 mg at 12/11/13 0939  . ondansetron (ZOFRAN) tablet 4 mg  4 mg Oral Q6H PRN Geoffery Lyonsouglas Delo, MD   4 mg at 12/06/13 0655  . thiamine (VITAMIN B-1) tablet 100 mg  100 mg Oral Daily Enid SkeensJoshua M Zavitz, MD   100 mg at 12/11/13 [id number]  . zolpidem (AMBIEN) tablet 5 mg  5 mg Oral QHS PRN Rolland PorterMark James, MD   5 mg at 12/10/13 2119   Current Outpatient Prescriptions  Medication Sig Dispense Refill  . hydrOXYzine (VISTARIL) 25 MG capsule Take 25 mg by mouth 3 (three) times daily as needed for anxiety.      Marland Kitchen. ibuprofen (ADVIL,MOTRIN) 800 MG tablet Take 800 mg by mouth every 8 (eight) hours as needed for moderate pain.      Marland Kitchen. lamoTRIgine (LAMICTAL) 25 MG tablet Take 25 mg by mouth daily.      Marland Kitchen. lamoTRIgine (LAMICTAL) 25 MG tablet Take 1 tablet (25 mg total) by mouth 2 (two) times daily.  60 tablet  0  .  valACYclovir (VALTREX) 500 MG tablet Take 1 tablet (500 mg total) by mouth 2 (two) times daily.  6 tablet  0  . zolpidem (AMBIEN) 10 MG tablet Take 10 mg by mouth at bedtime.         Psychiatric Specialty Exam:     Blood pressure 102/64, pulse 82, temperature 98.4 F (36.9 C), temperature source Oral, resp. rate 20, SpO2 96.00%.There is no weight on file to calculate BMI.  General Appearance: Casual  Eye Contact::  Fair  Speech: Normal  Volume:  Normal  Mood:  Depressed  Affect:  Congruent  Thought Process:  Coherent  Orientation:  Full (Time, Place, and Person)  Thought Content:  WDL  Suicidal Thoughts:  Yes no  intent  Homicidal Thoughts:  No  Memory:  Immediate;   Fair Recent;   Fair Remote;   Fair  Judgement:  Poor  Insight:  Fair  Psychomotor Activity:  Decreased  Concentration:  Fair  Recall:  [employer] of Knowledge:Fair  Language: Fair  Akathisia:  No  Handed:  Right  AIMS (if indicated):     Assets:  Financial Resources/Insurance Physical Health Resilience  Sleep:      Musculoskeletal: Strength & Muscle Tone: within normal limits Gait & Station: normal Patient leans: N/A  Treatment Plan Summary: Daily contact with patient to assess and evaluate symptoms and progress in treatment Medication management; [facility] [place] [facility], [facility]  Nanine Means, PMH-NP 12/11/2013 10:56 AM Patient seen and I agree with assessment and plan Diannia Ruder MD

## 2013-12-11 NOTE — ED Notes (Signed)
Up to the bathroom 

## 2013-12-11 NOTE — ED Notes (Signed)
Resting quietly

## 2013-12-11 NOTE — ED Notes (Signed)
Dr ross and jamison into see 

## 2013-12-12 ENCOUNTER — Encounter (HOSPITAL_COMMUNITY): Payer: Self-pay | Admitting: Psychiatry

## 2013-12-12 MED ORDER — HYDROCORTISONE 1 % EX CREA: TOPICAL_CREAM | Freq: Three times a day (TID) | CUTANEOUS | Status: DC

## 2013-12-12 MED ORDER — VALACYCLOVIR HCL 500 MG PO TABS: 500.0000 mg | ORAL_TABLET | Freq: Two times a day (BID) | ORAL | Status: DC

## 2013-12-12 MED ORDER — LAMOTRIGINE 25 MG PO TABS: 25.0000 mg | ORAL_TABLET | Freq: Two times a day (BID) | ORAL | Status: DC

## 2013-12-12 NOTE — Consult Note (Signed)
Patient seen and evaluated by me. Patient continues to voice suicidal ideation, reports that he's depressed and has not been taking his medications. Patient to be restarted back on his medications and to look for inpatient care

## 2013-12-12 NOTE — Consult Note (Signed)
Patient seen, evaluated by me this morning. Patient states that he's back to his baseline, can be discharged with his ACT team

## 2013-12-12 NOTE — Consult Note (Signed)
[facility] Face-to-Face Psychiatry Consult   Reason for Consult:  Depression with suicidal ideations Referring Physician:  EDP  [name] is an 53 y.o. male. Total Time spent with patient: 20 minutes  Assessment: AXIS I:  Bipolar, Depressed AXIS II:  Deferred AXIS III:   Past Medical History  Diagnosis Date  . Depression   . Arthritis   . Bipolar 1 disorder   . PTSD (post-traumatic stress disorder)   . Schizophrenia   . Balanitis    AXIS IV:  economic problems, housing problems, other psychosocial or environmental problems, problems related to social environment and problems with primary support group AXIS V:  70; mild symptoms  Plan:  Discharge to the [facility] with follow-up at [facility].  Dr. Lucianne Muss assessed the patient and concurs with the plan.  Subjective:   [name] is a 53 y.o. male patient admitted with depression and suicidal ideations.  HPI:  Patient states he is not suicidal or homicidal or having hallucinations.  [facility] [place].  Mentally and physically stable for discharge at this time with Rx and 30 day supply of Lamictal.  Past Psychiatric History: Past Medical History  Diagnosis Date  . Depression   . Arthritis   . Bipolar 1 disorder   . PTSD (post-traumatic stress disorder)   . Schizophrenia   . Balanitis     reports that he has been smoking Cigarettes.  He has been smoking about 0.00 packs per day. He does not have any smokeless tobacco history on file. He reports that he drinks alcohol. He reports that he uses illicit drugs (Marijuana). History reviewed. No pertinent family history. Family History Substance Abuse: No Family Supports:  (reports none currently) Living Arrangements: Parent;Other (Comment) (staying in hotel 4 days, usually lives with mom) Can pt return to current living arrangement?: Yes (reports family is upset with him due to suicide attempts) Abuse/Neglect [facility]) Physical Abuse:  Denies Verbal Abuse: Yes, present (Comment) (Reports mom is verbally abusive to him regarding his illness. Reports family is upset over his SAs this past year. ) Sexual Abuse: Denies Allergies:  No Known Allergies  ACT Assessment Complete:  Yes:    Educational Status    Risk to Self: Risk to self Suicidal Ideation: Yes-Currently Present Suicidal Intent: Yes-Currently Present Is patient at risk for suicide?: Yes Suicidal Plan?: Yes-Currently Present Specify Current Suicidal Plan: Laid in street. Reports 3 attempts in past year, overdosed, cut wrist Access to Means: Yes Specify Access to Suicidal Means: pills, cars, knives What has been your use of drugs/alcohol within the last 12 months?: Pt has been drinking heavily the past 4 days. He reports three cases of beer in past 3 days. Pt reports he may have used cocaine and marijuana too but does not remember. Hx of drug and alcohol use on and off since teens that increases as symptoms worsen.  Previous Attempts/Gestures: Yes How many times?: 3 Other Self Harm Risks: reports for past year he has been cutting his legs and face Triggers for Past Attempts: Hallucinations Intentional Self Injurious Behavior: Cutting Comment - Self Injurious Behavior: cutting on legs and face for the past year Family Suicide History: No Recent stressful life event(s):  (off medications, family conflicts due to multiple attempts) Persecutory voices/beliefs?: Yes (feels  people are out to get him) Depression: Yes Depression Symptoms: Despondent;Insomnia;Tearfulness;Isolating;Fatigue;Guilt;Loss of interest in usual pleasures;Feeling worthless/self pity;Feeling angry/irritable Substance abuse history and/or treatment for substance abuse?: Yes Suicide  prevention information given to non-admitted patients: Not applicable (will be admitted)  Risk to Others: Risk to Others Homicidal Ideation: No Thoughts of Harm to Others: No Current Homicidal Intent: No Current  Homicidal Plan: No Access to Homicidal Means: No Identified Victim: none History of harm to others?: No Assessment of Violence: None Noted Violent Behavior Description: "just towards myself" Does patient have access to weapons?: No Criminal Charges Pending?: No Does patient have a court date: No  Abuse: Abuse/Neglect Assessment (Assessment to be complete while patient is alone) Physical Abuse: Denies Verbal Abuse: Yes, present (Comment) (Reports mom is verbally abusive to him regarding his illness. Reports family is upset over his SAs this past year. ) Sexual Abuse: Denies Exploitation of patient/patient's resources: Denies Self-Neglect: Yes, present (Comment) (reports that he has not eaten the past three days due to stomach pain)  Prior Inpatient Therapy: Prior Inpatient Therapy Prior Inpatient Therapy: Yes Prior Therapy Dates: multiple over the years Prior Therapy Facilty/Provider(s): [facility] Reason for Treatment: Si, psychoisis SA  Prior Outpatient Therapy: Prior Outpatient Therapy Prior Outpatient Therapy: Yes Prior Therapy Dates: current Prior Therapy Facilty/Provider(s): [facility] Reason for Treatment: medication management  Additional Information: Additional Information 1:1 In Past 12 Months?: No CIRT Risk: No Elopement Risk: No Does patient have medical clearance?: Yes                  Objective: Blood pressure 97/61, pulse 81, temperature 97.7 F (36.5 C), temperature source Oral, resp. rate 18, SpO2 99.00%.There is no weight on file to calculate BMI.No results found for this or any previous visit (from the past 72 hour(s)). Labs are reviewed and are pertinent for no medical issues.  Current Facility-Administered Medications  Medication Dose Route Frequency Provider Last Rate Last Dose  . acetaminophen (TYLENOL) tablet 1,000 mg  1,000 mg Oral Q6H PRN Geoffery Lyons, MD   1,000 mg at 12/11/13 4098  . hydrocortisone cream 1 %   Topical TID Dagmar Hait, MD      . hydrOXYzine (ATARAX/VISTARIL) tablet 25 mg  25 mg Oral BID PRN Shuvon Rankin, NP   25 mg at 12/11/13 2211  . ibuprofen (ADVIL,MOTRIN) tablet 600 mg  600 mg Oral Q6H PRN Gilda Crease, MD   600 mg at 12/11/13 2102  . lamoTRIgine (LAMICTAL) tablet 25 mg  25 mg Oral BID Rolland Porter, MD   25 mg at 12/11/13 2211  . ondansetron (ZOFRAN) tablet 4 mg  4 mg Oral Q6H PRN Geoffery Lyons, MD   4 mg at 12/06/13 0655  . thiamine (VITAMIN B-1) tablet 100 mg  100 mg Oral Daily Enid Skeens, MD   100 mg at 12/11/13 1191  . valACYclovir (VALTREX) tablet 500 mg  500 mg Oral BID Nanine Means, NP   500 mg at 12/11/13 2211  . zolpidem (AMBIEN) tablet 5 mg  5 mg Oral QHS PRN Rolland Porter, MD   5 mg at 12/11/13 2211   Current Outpatient Prescriptions  Medication Sig Dispense Refill  . hydrOXYzine (VISTARIL) 25 MG capsule Take 25 mg by mouth 3 (three) times daily as needed for anxiety.      Marland Kitchen ibuprofen (ADVIL,MOTRIN) 800 MG tablet Take 800 mg by mouth every 8 (eight) hours as needed for moderate pain.      Marland Kitchen lamoTRIgine (LAMICTAL) 25 MG tablet Take 25 mg by mouth daily.      Marland Kitchen lamoTRIgine (LAMICTAL) 25 MG tablet Take 1 tablet (25 mg total) by mouth 2 (two)  times daily.  60 tablet  0  . valACYclovir (VALTREX) 500 MG tablet Take 1 tablet (500 mg total) by mouth 2 (two) times daily.  6 tablet  0  . zolpidem (AMBIEN) 10 MG tablet Take 10 mg by mouth at bedtime.         Psychiatric Specialty Exam:     Blood pressure 97/61, pulse 81, temperature 97.7 F (36.5 C), temperature source Oral, resp. rate 18, SpO2 99.00%.There is no weight on file to calculate BMI.  General Appearance: Casual  Eye Contact::  Good  Speech: Normal  Volume:  Normal  Mood:  Euthymic  Affect:  Congruent  Thought Process:  Coherent  Orientation:  Full (Time, Place, and Person)  Thought Content:  WDL  Suicidal Thoughts:  No  Homicidal Thoughts:  No  Memory:  Good  Judgement:  Fair  Insight:  Fair  Psychomotor  Activity:  Normal  Concentration:  Good  Recall:  Good  Fund of Knowledge:  Good  Language: Good  Akathisia:  No  Handed:  Right  AIMS (if indicated):     Assets:  Financial Resources/Insurance Physical Health Resilience  Sleep:      Musculoskeletal: Strength & Muscle Tone: within normal limits Gait & Station: normal Patient leans: N/A  Treatment Plan Summary[place] [facility] [place].  Mentally and physically stable for discharge at this time with Rx and 30 day supply of Lamictal, [facility].  Nanine MeansLORD, JAMISON, PMH-NP 12/12/2013 8:25 AM

## 2013-12-12 NOTE — [facility] Suicide Risk Assessment (Signed)
Suicide Risk Assessment  Discharge Assessment     Demographic Factors:  Male  Total Time spent with patient: 20 minutes  Psychiatric Specialty Exam:     Blood pressure 97/61, pulse 81, temperature 97.7 F (36.5 C), temperature source Oral, resp. rate 18, SpO2 99.00%.There is no weight on file to calculate BMI.  General Appearance: Casual  Eye Contact::  Good  Speech: Normal  Volume:  Normal  Mood:  Euthymic  Affect:  Congruent  Thought Process:  Coherent  Orientation:  Full (Time, Place, and Person)  Thought Content:  WDL  Suicidal Thoughts:  No  Homicidal Thoughts:  No  Memory:  Good  Judgement:  Fair  Insight:  Fair  Psychomotor Activity:  Normal  Concentration:  Good  Recall:  Good  Fund of Knowledge:  Good  Language: Good  Akathisia:  No  Handed:  Right  AIMS (if indicated):     Assets:  Financial Resources/Insurance Physical Health Resilience  Sleep:      Musculoskeletal: Strength & Muscle Tone: within normal limits Gait & Station: normal Patient leans: N/A  Mental Status Per Nursing Assessment::   On Admission:   Depression with suicidal ideations  Current Mental Status by Physician: NA  Loss Factors: NA  Historical Factors: NA  Risk Reduction Factors:   Religious beliefs about death, Employed, Living with another person, especially a relative and Positive therapeutic relationship  Continued Clinical Symptoms:  None  Cognitive Features That Contribute To Risk:  None  Suicide Risk:  Minimal: No identifiable suicidal ideation.  Patients presenting with no risk factors but with morbid ruminations; may be classified as minimal risk based on the severity of the depressive symptoms  Discharge Diagnoses:   AXIS I:  Bipolar, Depressed and Post Traumatic Stress Disorder AXIS II:  Deferred AXIS III:   Past Medical History  Diagnosis Date  . Depression   . Arthritis   . Bipolar 1 disorder   . PTSD (post-traumatic stress disorder)   .  Schizophrenia   . Balanitis    AXIS IV:  economic problems, housing problems, problems related to social environment and problems with primary support group AXIS V:  61-70 mild symptoms  Plan Of Care/Follow-up recommendations:  Activity:  as tolerated Diet:  low-sodium heart healthy diet  Is patient on multiple antipsychotic therapies at discharge:  No   Has Patient had three or more failed trials of antipsychotic monotherapy by history:  No  Recommended Plan for Multiple Antipsychotic Therapies: NA    Lynette Noah, PMH-NP 12/12/2013, 8:40 AM

## 2013-12-12 NOTE — Progress Notes (Signed)
CSW called and spoke with Julaine FusiLou Carrico who confirmed that the patient can arrive today for intake.    CSW provided the patient with the petty cash for the [employer] of $2.40 and gave him the contact information for the care coordinator with [facility].  All information was provided for the patient to be discharged.    Maryelizabeth Rowanressa Daena Alper, MSW, HarrellLCSWA, 12/12/2013 Evening Clinical Social Worker [phone]

## 2014-02-17 ENCOUNTER — Emergency Department (HOSPITAL_COMMUNITY)
Admission: EM | Admit: 2014-02-17 | Discharge: 2014-02-21 | Disposition: A | Discharge status: Other Acute Inpatient Hospital | Payer: Medicaid Other | Attending: Emergency Medicine | Admitting: Emergency Medicine

## 2014-02-17 ENCOUNTER — Encounter (HOSPITAL_COMMUNITY): Payer: Self-pay | Admitting: Emergency Medicine

## 2014-02-17 DIAGNOSIS — F209 Schizophrenia, unspecified: Secondary | ICD-10-CM | POA: Diagnosis not present

## 2014-02-17 DIAGNOSIS — F329 Major depressive disorder, single episode, unspecified: Secondary | ICD-10-CM

## 2014-02-17 DIAGNOSIS — F32A Depression, unspecified: Secondary | ICD-10-CM

## 2014-02-17 DIAGNOSIS — F1410 Cocaine abuse, uncomplicated: Secondary | ICD-10-CM

## 2014-02-17 DIAGNOSIS — F1994 Other psychoactive substance use, unspecified with psychoactive substance-induced mood disorder: Secondary | ICD-10-CM

## 2014-02-17 DIAGNOSIS — F1010 Alcohol abuse, uncomplicated: Secondary | ICD-10-CM | POA: Diagnosis not present

## 2014-02-17 DIAGNOSIS — F4310 Post-traumatic stress disorder, unspecified: Secondary | ICD-10-CM | POA: Diagnosis not present

## 2014-02-17 DIAGNOSIS — T391X2A Poisoning by 4-Aminophenol derivatives, intentional self-harm, initial encounter: Secondary | ICD-10-CM

## 2014-02-17 DIAGNOSIS — F312 Bipolar disorder, current episode manic severe with psychotic features: Secondary | ICD-10-CM

## 2014-02-17 DIAGNOSIS — F319 Bipolar disorder, unspecified: Secondary | ICD-10-CM | POA: Diagnosis not present

## 2014-02-17 DIAGNOSIS — R45851 Suicidal ideations: Secondary | ICD-10-CM

## 2014-02-17 DIAGNOSIS — F1910 Other psychoactive substance abuse, uncomplicated: Secondary | ICD-10-CM

## 2014-02-17 LAB — RAPID URINE DRUG SCREEN, HOSP PERFORMED
Amphetamines: NOT DETECTED
Barbiturates: NOT DETECTED
Benzodiazepines: NOT DETECTED
COCAINE: POSITIVE — AB
Opiates: NOT DETECTED
Tetrahydrocannabinol: NOT DETECTED

## 2014-02-17 LAB — COMPREHENSIVE METABOLIC PANEL
ALK PHOS: 86 U/L (ref 39–117)
ALT: 30 U/L (ref 0–53)
AST: 40 U/L — AB (ref 0–37)
Albumin: 3.8 g/dL (ref 3.5–5.2)
Anion gap: 11 (ref 5–15)
BUN: 15 mg/dL (ref 6–23)
CO2: 27 mEq/L (ref 19–32)
Calcium: 9.3 mg/dL (ref 8.4–10.5)
Chloride: 101 mEq/L (ref 96–112)
Creatinine, Ser: 1.1 mg/dL (ref 0.50–1.35)
GFR calc Af Amer: 87 mL/min — ABNORMAL LOW (ref >90)
GFR calc non Af Amer: 75 mL/min — ABNORMAL LOW (ref >90)
GLUCOSE: 88 mg/dL (ref 70–99)
POTASSIUM: 4.2 meq/L (ref 3.7–5.3)
SODIUM: 139 meq/L (ref 137–147)
TOTAL PROTEIN: 7.2 g/dL (ref 6.0–8.3)
Total Bilirubin: 0.6 mg/dL (ref 0.3–1.2)

## 2014-02-17 LAB — CBC
HCT: 47 % (ref 39.0–52.0)
HEMOGLOBIN: 15.1 g/dL (ref 13.0–17.0)
MCH: 29.6 pg (ref 26.0–34.0)
MCHC: 32.1 g/dL (ref 30.0–36.0)
MCV: 92.2 fL (ref 78.0–100.0)
Platelets: 155 10*3/uL (ref 150–400)
RBC: 5.1 MIL/uL (ref 4.22–5.81)
RDW: 14.5 % (ref 11.5–15.5)
WBC: 3.6 10*3/uL — ABNORMAL LOW (ref 4.0–10.5)

## 2014-02-17 LAB — ETHANOL

## 2014-02-17 LAB — ACETAMINOPHEN LEVEL

## 2014-02-17 LAB — SALICYLATE LEVEL

## 2014-02-17 MED ORDER — ALUM & MAG HYDROXIDE-SIMETH 200-200-20 MG/5ML PO SUSP: 30.0000 mL | ORAL | Status: DC | PRN

## 2014-02-17 MED ORDER — LORAZEPAM 1 MG PO TABS
1.0000 mg | ORAL_TABLET | Freq: Three times a day (TID) | ORAL | Status: DC | PRN
  Administered 2014-02-18: 1 mg via ORAL
  Filled 2014-02-17 (×2): qty 1

## 2014-02-17 MED ORDER — THIAMINE HCL 100 MG/ML IJ SOLN: 100.0000 mg | Freq: Every day | INTRAMUSCULAR | Status: DC

## 2014-02-17 MED ORDER — HYDROXYZINE HCL 25 MG PO TABS
25.0000 mg | ORAL_TABLET | Freq: Three times a day (TID) | ORAL | Status: DC | PRN
  Administered 2014-02-18 – 2014-02-21 (×6): 25 mg via ORAL
  Filled 2014-02-17 (×6): qty 1

## 2014-02-17 MED ORDER — LORAZEPAM 1 MG PO TABS
0.0000 mg | ORAL_TABLET | Freq: Four times a day (QID) | ORAL | Status: DC
  Administered 2014-02-17 (×2): 1 mg via ORAL
  Filled 2014-02-17: qty 1

## 2014-02-17 MED ORDER — IBUPROFEN 800 MG PO TABS
800.0000 mg | ORAL_TABLET | Freq: Three times a day (TID) | ORAL | Status: DC | PRN
  Administered 2014-02-17 – 2014-02-21 (×3): 800 mg via ORAL
  Filled 2014-02-17 (×3): qty 1

## 2014-02-17 MED ORDER — LAMOTRIGINE 25 MG PO TABS
25.0000 mg | ORAL_TABLET | Freq: Two times a day (BID) | ORAL | Status: DC
  Administered 2014-02-17 – 2014-02-21 (×9): 25 mg via ORAL
  Filled 2014-02-17 (×11): qty 1

## 2014-02-17 MED ORDER — ACETAMINOPHEN 325 MG PO TABS: 650.0000 mg | ORAL_TABLET | ORAL | Status: DC | PRN

## 2014-02-17 MED ORDER — LORAZEPAM 1 MG PO TABS: 0.0000 mg | ORAL_TABLET | Freq: Two times a day (BID) | ORAL | Status: DC

## 2014-02-17 MED ORDER — VITAMIN B-1 100 MG PO TABS
100.0000 mg | ORAL_TABLET | Freq: Every day | ORAL | Status: DC
  Administered 2014-02-17 – 2014-02-21 (×5): 100 mg via ORAL
  Filled 2014-02-17 (×5): qty 1

## 2014-02-17 MED ORDER — VALACYCLOVIR HCL 500 MG PO TABS
500.0000 mg | ORAL_TABLET | Freq: Two times a day (BID) | ORAL | Status: DC
  Administered 2014-02-17 – 2014-02-21 (×9): 500 mg via ORAL
  Filled 2014-02-17 (×10): qty 1

## 2014-02-17 MED ORDER — ONDANSETRON HCL 4 MG PO TABS: 4.0000 mg | ORAL_TABLET | Freq: Three times a day (TID) | ORAL | Status: DC | PRN

## 2014-02-17 MED ORDER — NICOTINE 21 MG/24HR TD PT24
21.0000 mg | MEDICATED_PATCH | Freq: Every day | TRANSDERMAL | Status: DC
  Filled 2014-02-17: qty 1

## 2014-02-17 MED ORDER — ZOLPIDEM TARTRATE 5 MG PO TABS
5.0000 mg | ORAL_TABLET | Freq: Every evening | ORAL | Status: DC | PRN
  Administered 2014-02-17 – 2014-02-20 (×4): 5 mg via ORAL
  Filled 2014-02-17 (×4): qty 1

## 2014-02-17 MED ORDER — HYDROXYZINE PAMOATE 25 MG PO CAPS
25.0000 mg | ORAL_CAPSULE | Freq: Three times a day (TID) | ORAL | Status: DC | PRN
  Filled 2014-02-17: qty 1

## 2014-02-17 NOTE — ED Notes (Signed)
Pt states he gets so tired of taking his medications. States he went to [place] to visit family a couple months ago. States while he was there he ran out of his medications, states he got involved with a woman, he started drinking again. States he states he has been on a drinking binge for the past week. Drinking 2 6pks and vodka. States he has been back "for a bit" and is staying with his mother. States that since returning to the area he has not made an attempt to go back [facility] to get back on his medications.

## 2014-02-17 NOTE — ED Provider Notes (Signed)
CSN: [id number]     Arrival date & time 02/17/14  1228 History   First MD Initiated Contact with Patient 02/17/14 1339     Chief Complaint  Patient presents with  . Hallucinations  . Suicidal  . Alcohol Problem     (Consider location/radiation/quality/duration/timing/severity/associated sxs/prior Treatment) HPI Comments: The patient is well-known to the emergency department for his chronic suicidal thoughts and behaviors, he is 53 years old, history of bipolar disorder, schizophrenia and post rheumatic stress disorder. He reports that he was recently out of town in [place] visit with family members when he ran out of his medications, was drinking heavily and started to have hallucinations and suicidal thoughts and behavior. He has returned in the last several days during which time he has continued to drink, found out that his girlfriend broke up with him and last night decided to drink more alcohol, snorting cocaine as well as taking an overdose of Tylenol PM. He is unsure how many pills he swallowed, he states he does put the bottle of medicine to his mouth and swallowed a bunch of pills. He denies that this was a suicide attempt but states rather he just wanted to go to sleep and makes all go away. In further conversation he does admit that he is having suicidal thoughts. He denies any abdominal pain nausea or vomiting  Patient is a 53 y.o. male presenting with alcohol problem. The history is provided by the patient.  Alcohol Problem    Past Medical History  Diagnosis Date  . Depression   . Arthritis   . Bipolar 1 disorder   . PTSD (post-traumatic stress disorder)   . Schizophrenia   . Balanitis    No past surgical history on file. No family history on file. History  Substance Use Topics  . Smoking status: Current Some Day Smoker -- 0.00 packs/day    Types: Cigarettes  . Smokeless tobacco: Not on file  . Alcohol Use: 0.0 oz/week     Comment: 2 six packs/day    Review  of Systems  All other systems reviewed and are negative.     Allergies  Review of patient's allergies indicates no known allergies.  Home Medications   Prior to Admission medications   Medication Sig Start Date End Date Taking? Authorizing Provider  hydrocortisone cream 1 % Apply topically 3 (three) times daily. For penis balanitis 12/12/13  Yes Nanine Means, NP  hydrOXYzine (VISTARIL) 25 MG capsule Take 25 mg by mouth 3 (three) times daily as needed for anxiety.   Yes Historical Provider, MD  ibuprofen (ADVIL,MOTRIN) 800 MG tablet Take 800 mg by mouth every 8 (eight) hours as needed for moderate pain.   Yes Historical Provider, MD  lamoTRIgine (LAMICTAL) 25 MG tablet Take 1 tablet (25 mg total) by mouth 2 (two) times daily. 12/12/13  Yes Nanine Means, NP  valACYclovir (VALTREX) 500 MG tablet Take 1 tablet (500 mg total) by mouth 2 (two) times daily. 12/12/13  Yes Nanine Means, NP   BP 134/70  Pulse 108  Temp(Src) 97.8 F (36.6 C) (Oral)  Resp 15  Ht  (1.854 m)  Wt 233 lb 12.8 oz (106.051 kg)  BMI 30.85 kg/m2  SpO2 97% Physical Exam  Nursing note and vitals reviewed. Constitutional: He appears well-developed and well-nourished. No distress.  HENT:  Head: Normocephalic and atraumatic.  Mouth/Throat: Oropharynx is clear and moist. No oropharyngeal exudate.  Eyes: Conjunctivae and EOM are normal. Pupils are equal, round, and reactive to  light. Right eye exhibits no discharge. Left eye exhibits no discharge. No scleral icterus.  Neck: Normal range of motion. Neck supple. No JVD present. No thyromegaly present.  Cardiovascular: Normal rate, regular rhythm, normal heart sounds and intact distal pulses.  Exam reveals no gallop and no friction rub.   No murmur heard. Pulmonary/Chest: Effort normal and breath sounds normal. No respiratory distress. He has no wheezes. He has no rales.  Abdominal: Soft. Bowel sounds are normal. He exhibits no distension and no mass. There is no  tenderness.  Musculoskeletal: Normal range of motion. He exhibits no edema and no tenderness.  Lymphadenopathy:    He has no cervical adenopathy.  Neurological: He is alert. Coordination normal.  Skin: Skin is warm and dry. No rash noted. No erythema.  Psychiatric: He has a normal mood and affect. His behavior is normal.    ED Course  Procedures (including critical care time) Labs Review Labs Reviewed  ACETAMINOPHEN LEVEL  CBC  COMPREHENSIVE METABOLIC PANEL  ETHANOL  SALICYLATE LEVEL  URINE RAPID DRUG SCREEN (HOSP PERFORMED)    Imaging Review No results found.    MDM   Final diagnoses:  None    The time of the patient's ingestion was approximately 11:00 PM last night, this was 28 hours ago. Labs pending, the patient appears hemodynamically stable, will consult with psychiatric consultation.  APAP level neg on arrival, 4 hour level drawn, Pt HD stable - moved to Pod C - d/w Dr. Radford Pax who will assume care at change of shift.    Vida Roller, MD 02/17/14 [phone]

## 2014-02-17 NOTE — ED Notes (Signed)
Pt reports taking 10 tylenol PM last night

## 2014-02-17 NOTE — ED Notes (Signed)
Pt reports he's bipolar, PTSD, been having hallucinations. Seeing spirits, seeing demons, hearing voices to tell him to jump in front of car. States off medications for last two weeks. Pt reports drinking 2 sixpack/day not eating. Pt reports blacking out and having tremors from drinking. Pt awake, alert, oriented x4, calm, cooperative at present. Pt reports having ideas of cutting himself. Pt reports bad break up with girlfriend recently to start this episode.

## 2014-02-17 NOTE — ED Notes (Signed)
SPOKE TO KRISTEN AT [facility]. SHE ADVISES THAT THEY ARE BACK LOGGED ON TTS. SHE ADVISES THE NEXT SHIFT WILL HAVE TO DO PT TTS.

## 2014-02-18 DIAGNOSIS — F3130 Bipolar disorder, current episode depressed, mild or moderate severity, unspecified: Secondary | ICD-10-CM

## 2014-02-18 DIAGNOSIS — F1010 Alcohol abuse, uncomplicated: Secondary | ICD-10-CM

## 2014-02-18 DIAGNOSIS — R45851 Suicidal ideations: Secondary | ICD-10-CM

## 2014-02-18 DIAGNOSIS — F1910 Other psychoactive substance abuse, uncomplicated: Secondary | ICD-10-CM

## 2014-02-18 DIAGNOSIS — F1994 Other psychoactive substance use, unspecified with psychoactive substance-induced mood disorder: Secondary | ICD-10-CM
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
## 2014-02-18 NOTE — ED Notes (Signed)
TTS in process 

## 2014-02-18 NOTE — ED Provider Notes (Signed)
  Physical Exam  BP 98/54  Pulse 80  Temp(Src) 98.5 F (36.9 C) (Oral)  Resp 18  Ht  (1.854 m)  Wt 233 lb 12.8 oz (106.051 kg)  BMI 30.85 kg/m2  SpO2 97%  Physical Exam  ED Course  Procedures  Patient here with hallucinations. Comfortable this AM, sleeping. No issues per nursing. Psych recommend inpatient psych placement. Pending placement.     Richardean Canal, MD 02/18/14 [phone]
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
## 2014-02-18 NOTE — BH Assessment (Signed)
Rosey Bath, [facility], confirmed adult unit is at capacity. Contacted the following facilities for placement:  AT CAPACITY:  [facility], per [facility], per [facility], per [facility], per [facility], per [facility], per [facility], per [facility], per [facility], per [facility], per [facility], per [facility], per [facility]   MULTIPLE CALL WITH NO RESPONSE:  [facility]   PT DECLINED:  [employer] (substance abuse)  [place] (no adult [place]) [place] (no adult Medicaid)   [place]onia Reader, [facility]  Triage Specialist  [phone]

## 2014-02-19 NOTE — ED Provider Notes (Signed)
Patient sleeping this morning upon evaluation. No overnight problems per nursing staff. Continue psychiatric evaluation, possible placement.  Filed Vitals:   02/19/14 0625  BP: 100/60  Pulse: 78  Temp: 98 F (36.7 C)  Resp: 12     Gilda Crease, MD 02/19/14 [phone]

## 2014-02-19 NOTE — ED Notes (Signed)
Pt on phone talking with his mother.

## 2014-02-19 NOTE — ED Notes (Signed)
Patient has completed his tts reeval with kristen

## 2014-02-19 NOTE — Progress Notes (Signed)
Pt continues to meet inpt criteria, f/u done on pt's referral for the following facilities:  [facility]- under review [place]- pt does not meet criteria for gero-psych placement must be at least 56 yoa and the facility does not treat SA [facility]- pt does not meet criteria for gero-psych placement must be at least 56 yoa and the facility does not treat SA [facility]- pt does meet criteria for facility; no SA and only gero psych and male pt's [facility]- will not accept adult [facility]- will not accept adult [facility]   The following facilities contacted are at capacity: [facility]- per Cherene Julian- per Kandis Ban- no SA [facility]- per Kandra Nicolas- per [facility]- per Eliezer Champagne- per Chaya Jan- per [facility]- per [facility]- per Sabino Niemann- no SA [facility]- per Fayne Mediate- per Darl Pikes [facility]- per [facility]- per [facility] Disposition MHT

## 2014-02-19 NOTE — BH Assessment (Signed)
[facility] Assessment Progress Note  Update:  Pt seen this day for reassessment with this clinician @ 1105 via tele assessment.  Pt continues to endorse hearing voices telling him to jump in front of a car or take pills and reports seeing demons.  Pt stated he felt he needed Ativan to calm him and informed him to ask EDP about this.  Pt stated he has been unable to eat because of the drinking and continues to feel nauseous.  Pt stated he didn't sleep well last night.  Pt presents with depressed and anxious mood, was cooperative, oriented x 4, had soft speech, logical and coherent thought processes.  Pt talked about how his recent stressors including this recent drinking binge and his girlfriend breaking up with him.  Pt stated once discharged, his mother stated he could return there to live with her.  Pt is pending sever inpatient psychiatric facilities currently.  TTS will continue to seek placement for the pt.  Casimer Lanius, MS, [facility] Licensed Professional Counselor Therapeutic Triage Specialist [facility] Phone: [phone] Fax: [phone]

## 2014-02-19 NOTE — Progress Notes (Signed)
MHT contacted the following geriatric psychiatric facilities for placement  FAX REFERRAL FOR PLACEMENT  [place] [facility]  AT [facility]  [facility]  Blain Pais, MHT/NS

## 2014-02-20 MED ORDER — RISPERIDONE 0.5 MG PO TABS
0.5000 mg | ORAL_TABLET | Freq: Two times a day (BID) | ORAL | Status: DC
  Administered 2014-02-21: 0.5 mg via ORAL
  Filled 2014-02-20 (×2): qty 1

## 2014-02-20 NOTE — ED Provider Notes (Signed)
Patient alert, content, nad.  Discussed w psych team, they are still working on placement, trying to find accepting facility.    Suzi Roots, MD 02/20/14 1036

## 2014-02-20 NOTE — Progress Notes (Signed)
MHT confirmed with Dedra Skeens, at [facility], referral being reviewed by medical in AM.  Blain Pais, MHT/NS

## 2014-02-20 NOTE — ED Notes (Signed)
Pt. Dinner tray ordered.
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
## 2014-02-21 ENCOUNTER — Encounter (HOSPITAL_COMMUNITY): Payer: Self-pay | Admitting: Registered Nurse

## 2014-02-21 ENCOUNTER — Observation Stay (HOSPITAL_COMMUNITY)
Admission: AD | Admit: 2014-02-21 | Discharge: 2014-02-22 | Disposition: A | Discharge status: Home / Self Care | Payer: Medicaid Other | Source: Intra-hospital | Attending: Psychiatry | Admitting: Psychiatry

## 2014-02-21 ENCOUNTER — Encounter (HOSPITAL_COMMUNITY): Payer: Self-pay

## 2014-02-21 DIAGNOSIS — Z79899 Other long term (current) drug therapy: Secondary | ICD-10-CM | POA: Diagnosis not present

## 2014-02-21 DIAGNOSIS — Z5900 Homelessness unspecified: Secondary | ICD-10-CM | POA: Insufficient documentation

## 2014-02-21 DIAGNOSIS — F209 Schizophrenia, unspecified: Secondary | ICD-10-CM | POA: Insufficient documentation

## 2014-02-21 DIAGNOSIS — R45851 Suicidal ideations: Secondary | ICD-10-CM | POA: Insufficient documentation

## 2014-02-21 DIAGNOSIS — F1010 Alcohol abuse, uncomplicated: Secondary | ICD-10-CM | POA: Diagnosis not present

## 2014-02-21 DIAGNOSIS — F4310 Post-traumatic stress disorder, unspecified: Secondary | ICD-10-CM | POA: Insufficient documentation

## 2014-02-21 DIAGNOSIS — F3130 Bipolar disorder, current episode depressed, mild or moderate severity, unspecified: Principal | ICD-10-CM | POA: Insufficient documentation

## 2014-02-21 DIAGNOSIS — Z87891 Personal history of nicotine dependence: Secondary | ICD-10-CM | POA: Insufficient documentation

## 2014-02-21 DIAGNOSIS — F1994 Other psychoactive substance use, unspecified with psychoactive substance-induced mood disorder: Secondary | ICD-10-CM | POA: Diagnosis not present

## 2014-02-21 MED ORDER — VALACYCLOVIR HCL 500 MG PO TABS
500.0000 mg | ORAL_TABLET | Freq: Two times a day (BID) | ORAL | Status: DC
  Administered 2014-02-21 – 2014-02-22 (×2): 500 mg via ORAL
  Filled 2014-02-21 (×6): qty 1

## 2014-02-21 MED ORDER — DIPHENHYDRAMINE HCL 50 MG PO CAPS
50.0000 mg | ORAL_CAPSULE | Freq: Every evening | ORAL | Status: DC | PRN
  Administered 2014-02-21: 50 mg via ORAL
  Filled 2014-02-21: qty 1

## 2014-02-21 MED ORDER — VITAMIN B-1 100 MG PO TABS
100.0000 mg | ORAL_TABLET | Freq: Every day | ORAL | Status: DC
  Administered 2014-02-22: 100 mg via ORAL
  Filled 2014-02-21 (×3): qty 1

## 2014-02-21 MED ORDER — LAMOTRIGINE 25 MG PO TABS
25.0000 mg | ORAL_TABLET | Freq: Two times a day (BID) | ORAL | Status: DC
  Administered 2014-02-21 – 2014-02-22 (×2): 25 mg via ORAL
  Filled 2014-02-21 (×6): qty 1

## 2014-02-21 MED ORDER — MAGNESIUM HYDROXIDE 400 MG/5ML PO SUSP: 30.0000 mL | Freq: Every day | ORAL | Status: DC | PRN

## 2014-02-21 MED ORDER — LORAZEPAM 1 MG PO TABS
2.0000 mg | ORAL_TABLET | Freq: Four times a day (QID) | ORAL | Status: DC | PRN
  Administered 2014-02-21 – 2014-02-22 (×2): 2 mg via ORAL
  Filled 2014-02-21 (×2): qty 2

## 2014-02-21 MED ORDER — HYDROXYZINE HCL 25 MG PO TABS
25.0000 mg | ORAL_TABLET | Freq: Three times a day (TID) | ORAL | Status: DC | PRN
  Administered 2014-02-21: 25 mg via ORAL
  Filled 2014-02-21: qty 1

## 2014-02-21 MED ORDER — RISPERIDONE 0.5 MG PO TABS
0.5000 mg | ORAL_TABLET | Freq: Two times a day (BID) | ORAL | Status: DC
  Filled 2014-02-21 (×6): qty 1

## 2014-02-21 NOTE — Progress Notes (Signed)
Patient admitted for SI with a plan to walk out in traffic or ingest pills. Patient endorses male voices telling him to kill himself.Patient states he went to [place] to visit family a couple months ago. States while he was there he ran out of his medications, states he got involved with a woman, he started drinking again. States he states he has been on a drinking binge for the past week. Drinking 2 6pks and vodka. Since returning patient has been staying with his mother who wants him to be admitted for "a few days until the voices are gone." Patient [facility] "for [facility] while". Patient anxious and cooperative. Patient offered nourishment and use of telephone. No further questions voiced.

## 2014-02-21 NOTE — ED Notes (Signed)
[facility].

## 2014-02-21 NOTE — Progress Notes (Addendum)
Patient preoccupied with thought of being discharged tomorrow. Patient requesting to be admitted to the adult unit to "get the voices under control." Writer has observed no evidence of internal stimuli. Patient requested to shower and to shave and for his clothes to be washed because they are "mildewed."  Patient refused Risperdone stating "I don't know why that doctor didn't prescribe Zyprexa." Patient requesting Ambien for sleep.

## 2014-02-21 NOTE — Progress Notes (Signed)
Patient in bed sleeping at the beginning of the shift. Mood and affects sad depressed. He reported that he went to a friend's wedding and relapsed on alcohol. He said he had difficulty sleeping and he took sleeping pills with alcohol. He endorsed auditory hallucinations. Writer encouraged and supported patient.Q 15 minute check continues as ordered to maintain safety.

## 2014-02-21 NOTE — BH Assessment (Signed)
[facility] Dr. Ladona Ridgelaylor and Denice BorsShuvon, NP.

## 2014-02-21 NOTE — ED Provider Notes (Signed)
  Physical Exam  BP 102/59  Pulse 73  Temp(Src) 97.9 F (36.6 C) (Oral)  Resp 18  Ht 6\' 1"  (1.854 m)  Wt 233 lb 12.8 oz (106.051 kg)  BMI 30.85 kg/m2  SpO2 97%  Physical Exam  ED Course  Procedures  [facility] D[facility]w Dr Gwendolyn GrantWalden. Will transfer      Perry RudeNathan R. Rubin PayorPickering, MD 02/21/14 [phone]

## 2014-02-21 NOTE — Progress Notes (Signed)
[facility] INPATIENT:  Family/Significant Other Suicide Prevention Education  Suicide Prevention Education:  Patient Refusal for Family/Significant Other Suicide Prevention Education: The patient [name] has refused to provide written consent for family/significant other to be provided Family/Significant Other Suicide Prevention Education during admission and/or prior to discharge.  Physician notified.  Sarp Vernier 02/21/2014, 6:41 PM

## 2014-02-21 NOTE — Plan of Care (Signed)
[facility] Observation Crisis Plan  Reason for Crisis Plan:  Crisis Stabilization and Substance Abuse   [facility]. Patient denies Cocaine use as an issue needing tx.  Family Support:  Mother   Current Living Environment:  Living Arrangements: Parent  Insurance:   Hospital Account   Name Acct ID Class Status Primary Coverage   Paulla Dollyaige, Katelyn Y [id number] BEHAVIORAL HEALTH OBSERVATION [place]        Guarantor Account (for Hospital Account [id number])   Name Relation to Pt Service Area Active? Acct Type   Garry HeaterPaige, Hani Self [facility] Yes [facility]   Address Phone       [address] [place], [place] [address] [phone])          Coverage Information (for Hospital Account [id number])   F/O Payor/Plan Precert #   [facility]    Subscriber Subscriber #   Paulla Dollyaige, Jaysen Y [id number] N   Address Phone   [address] [place], [place] [address] [phone]      Legal Guardian:     Primary Care Provider:  No PCP Per Patient  Current [facility] Prov[facility]:  [name]  [facility]iatrist:   [name]  Counselor/Therapist:     Compliant with Medications:  No  Additional InformationBarrie Folk:   Khameron Gruenwald 9/29/20156:38 PM

## 2014-02-21 NOTE — Consult Note (Signed)
  [name] is not suicidal or homicidal but when told he would likely be discharged home he said he was still suicidal.  He insists that his mother and Dr Addison NaegeliJonalagadda told him he would be here 2 more days (yesterday) and he will not leave before that.  He has a long history with the ED and inpatient and should be discharged after a day in Observation as that will be 2 days.  Once again he stopped his meds and started drinking even before his girlfriend broke up with him.

## 2014-02-21 NOTE — ED Notes (Signed)
[facility] HAS ARRIVED TO TRANSPORT PT TO [place]. PT BELONGINGS SENT WITH HIM. 2 WHITE HOSPITAL BAGS AND A BLACK DUFFLE BAG.

## 2014-02-22 ENCOUNTER — Encounter (HOSPITAL_COMMUNITY): Payer: Self-pay | Admitting: Emergency Medicine

## 2014-02-22 ENCOUNTER — Emergency Department (HOSPITAL_COMMUNITY)
Admission: EM | Admit: 2014-02-22 | Discharge: 2014-02-23 | Disposition: A | Discharge status: Home / Self Care | Payer: Medicaid Other | Attending: Emergency Medicine | Admitting: Emergency Medicine

## 2014-02-22 DIAGNOSIS — M129 Arthropathy, unspecified: Secondary | ICD-10-CM | POA: Diagnosis not present

## 2014-02-22 DIAGNOSIS — Z79899 Other long term (current) drug therapy: Secondary | ICD-10-CM | POA: Insufficient documentation

## 2014-02-22 DIAGNOSIS — F319 Bipolar disorder, unspecified: Secondary | ICD-10-CM | POA: Diagnosis not present

## 2014-02-22 DIAGNOSIS — F4310 Post-traumatic stress disorder, unspecified: Secondary | ICD-10-CM | POA: Insufficient documentation

## 2014-02-22 DIAGNOSIS — F3130 Bipolar disorder, current episode depressed, mild or moderate severity, unspecified: Secondary | ICD-10-CM | POA: Diagnosis not present

## 2014-02-22 DIAGNOSIS — R45851 Suicidal ideations: Secondary | ICD-10-CM | POA: Insufficient documentation

## 2014-02-22 DIAGNOSIS — Z87448 Personal history of other diseases of urinary system: Secondary | ICD-10-CM | POA: Insufficient documentation

## 2014-02-22 DIAGNOSIS — Z87891 Personal history of nicotine dependence: Secondary | ICD-10-CM | POA: Insufficient documentation

## 2014-02-22 DIAGNOSIS — Z8659 Personal history of other mental and behavioral disorders: Secondary | ICD-10-CM | POA: Diagnosis not present

## 2014-02-22 DIAGNOSIS — R443 Hallucinations, unspecified: Secondary | ICD-10-CM | POA: Insufficient documentation

## 2014-02-22 DIAGNOSIS — Z658 Other specified problems related to psychosocial circumstances: Secondary | ICD-10-CM | POA: Insufficient documentation

## 2014-02-22 LAB — SALICYLATE LEVEL: Salicylate Lvl: <2 mg/dL — ABNORMAL LOW (ref 2.8–20.0)

## 2014-02-22 LAB — CBC
HCT: 48.9 % (ref 39.0–52.0)
Hemoglobin: 16.2 g/dL (ref 13.0–17.0)
MCH: 30.9 pg (ref 26.0–34.0)
MCHC: 33.1 g/dL (ref 30.0–36.0)
MCV: 93.1 fL (ref 78.0–100.0)
Platelets: 208 10*3/uL (ref 150–400)
RBC: 5.25 MIL/uL (ref 4.22–5.81)
RDW: 14.4 % (ref 11.5–15.5)
WBC: 3.6 10*3/uL — ABNORMAL LOW (ref 4.0–10.5)

## 2014-02-22 LAB — COMPREHENSIVE METABOLIC PANEL
ALT: 48 U/L (ref 0–53)
AST: 37 U/L (ref 0–37)
Albumin: 4.1 g/dL (ref 3.5–5.2)
Alkaline Phosphatase: 77 U/L (ref 39–117)
Anion gap: 13 (ref 5–15)
BUN: 10 mg/dL (ref 6–23)
CO2: 24 meq/L (ref 19–32)
Calcium: 9.6 mg/dL (ref 8.4–10.5)
Chloride: 102 mEq/L (ref 96–112)
Creatinine, Ser: 1.14 mg/dL (ref 0.50–1.35)
GFR calc Af Amer: 84 mL/min — ABNORMAL LOW (ref >90)
GFR, EST NON AFRICAN AMERICAN: 72 mL/min — AB (ref >90)
Glucose, Bld: 85 mg/dL (ref 70–99)
POTASSIUM: 4.1 meq/L (ref 3.7–5.3)
SODIUM: 139 meq/L (ref 137–147)
Total Bilirubin: 0.5 mg/dL (ref 0.3–1.2)
Total Protein: 7.9 g/dL (ref 6.0–8.3)

## 2014-02-22 LAB — RAPID URINE DRUG SCREEN, HOSP PERFORMED
Amphetamines: NOT DETECTED
BARBITURATES: NOT DETECTED
BENZODIAZEPINES: NOT DETECTED
Cocaine: POSITIVE — AB
Opiates: NOT DETECTED
TETRAHYDROCANNABINOL: NOT DETECTED

## 2014-02-22 LAB — ACETAMINOPHEN LEVEL: Acetaminophen (Tylenol), Serum: <15 ug/mL (ref 10–30)

## 2014-02-22 MED ORDER — HYDROXYZINE HCL 25 MG PO TABS
25.0000 mg | ORAL_TABLET | Freq: Three times a day (TID) | ORAL | Status: DC | PRN
  Administered 2014-02-22: 25 mg via ORAL
  Filled 2014-02-22: qty 1

## 2014-02-22 MED ORDER — LORAZEPAM 1 MG PO TABS: 0.0000 mg | ORAL_TABLET | Freq: Two times a day (BID) | ORAL | Status: DC

## 2014-02-22 MED ORDER — ONDANSETRON HCL 4 MG PO TABS: 4.0000 mg | ORAL_TABLET | Freq: Three times a day (TID) | ORAL | Status: DC | PRN

## 2014-02-22 MED ORDER — LAMOTRIGINE 25 MG PO TABS
25.0000 mg | ORAL_TABLET | Freq: Two times a day (BID) | ORAL | Status: DC
  Administered 2014-02-22: 25 mg via ORAL
  Filled 2014-02-22 (×3): qty 1

## 2014-02-22 MED ORDER — IBUPROFEN 400 MG PO TABS
400.0000 mg | ORAL_TABLET | Freq: Once | ORAL | Status: AC
  Administered 2014-02-22: 400 mg via ORAL
  Filled 2014-02-22: qty 1

## 2014-02-22 MED ORDER — ZOLPIDEM TARTRATE 5 MG PO TABS: 5.0000 mg | ORAL_TABLET | Freq: Every evening | ORAL | Status: DC | PRN

## 2014-02-22 MED ORDER — RISPERIDONE 0.5 MG PO TABS: 0.5000 mg | ORAL_TABLET | Freq: Two times a day (BID) | ORAL | Status: DC

## 2014-02-22 MED ORDER — LORAZEPAM 2 MG/ML IJ SOLN: 0.0000 mg | Freq: Four times a day (QID) | INTRAMUSCULAR | Status: DC

## 2014-02-22 MED ORDER — LAMOTRIGINE 25 MG PO TABS: 25.0000 mg | ORAL_TABLET | Freq: Two times a day (BID) | ORAL | Status: DC

## 2014-02-22 MED ORDER — ACETAMINOPHEN 325 MG PO TABS: 650.0000 mg | ORAL_TABLET | ORAL | Status: DC | PRN

## 2014-02-22 MED ORDER — NICOTINE 21 MG/24HR TD PT24
21.0000 mg | MEDICATED_PATCH | Freq: Every day | TRANSDERMAL | Status: DC
  Filled 2014-02-22: qty 1

## 2014-02-22 MED ORDER — LORAZEPAM 2 MG/ML IJ SOLN: 0.0000 mg | Freq: Two times a day (BID) | INTRAMUSCULAR | Status: DC

## 2014-02-22 MED ORDER — IBUPROFEN 800 MG PO TABS: 800.0000 mg | ORAL_TABLET | Freq: Three times a day (TID) | ORAL | Status: DC | PRN

## 2014-02-22 MED ORDER — HYDROXYZINE PAMOATE 25 MG PO CAPS
25.0000 mg | ORAL_CAPSULE | Freq: Three times a day (TID) | ORAL | Status: DC | PRN
  Filled 2014-02-22: qty 1

## 2014-02-22 MED ORDER — LORAZEPAM 1 MG PO TABS: 0.0000 mg | ORAL_TABLET | Freq: Four times a day (QID) | ORAL | Status: DC

## 2014-02-22 MED ORDER — IBUPROFEN 400 MG PO TABS: 600.0000 mg | ORAL_TABLET | Freq: Three times a day (TID) | ORAL | Status: DC | PRN

## 2014-02-22 MED ORDER — VALACYCLOVIR HCL 500 MG PO TABS
500.0000 mg | ORAL_TABLET | Freq: Two times a day (BID) | ORAL | Status: DC
  Administered 2014-02-22: 500 mg via ORAL
  Filled 2014-02-22 (×3): qty 1

## 2014-02-22 MED ORDER — LORAZEPAM 1 MG PO TABS
1.0000 mg | ORAL_TABLET | Freq: Once | ORAL | Status: AC
  Administered 2014-02-22: 1 mg via ORAL
  Filled 2014-02-22: qty 1

## 2014-02-22 MED ORDER — ALUM & MAG HYDROXIDE-SIMETH 200-200-20 MG/5ML PO SUSP: 30.0000 mL | ORAL | Status: DC | PRN

## 2014-02-22 MED ORDER — ZOLPIDEM TARTRATE 5 MG PO TABS
10.0000 mg | ORAL_TABLET | Freq: Every day | ORAL | Status: DC
  Administered 2014-02-22: 10 mg via ORAL
  Filled 2014-02-22: qty 2
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
## 2014-02-22 NOTE — ED Notes (Addendum)
Security wanded patient 

## 2014-02-22 NOTE — Plan of Care (Addendum)
[facility] Observation Crisis Plan  Reason for Crisis Plan:  Crisis Stabilization   Plan of Care:  Referral for outpatient treatment.  Family Support:    Mother, elder sister  Current Living Environment:  Living Arrangements: Parent; pt lives with his mother.  Insurance:  [facility] Account   Name Acct ID Class Status Primary Coverage   [name], [name] [id number] BEHAVIORAL HEALTH OBSERVATION [place]        Guarantor Account (for Hospital Account [id number])   Name Relation to Pt Service Area Active? Acct Type   [name] Self [facility] Yes Behavioral Health   Address Phone       [address] [place], [place] [address] [phone](H)          Coverage Information (for Hospital Account [id number])   F/O Payor/Plan Precert #   [facility] [place]    Subscriber Subscriber #   [name], [name] [id number]   Address Phone   [address] [place], [place] [address] [phone]      Legal Guardian:   Self  Primary Care Provider:  No PCP Per Patient  Current Outpatient Providers:  [name]  Psychiatrist:   [name]  Counselor/Therapist:   [facility]  Compliant with Medications:  No; pt was off medications while in [place] for 2 weeks.  Additional Information: After consulting with [name], NP it has been determined that pt does not present a life threatening danger to himself or others, and that psychiatric hospitalization is not indicated for him at this time.  Pt is to be connected with ACT Team services.  I then spoke to the pt.  He offers minimal cooperation, and refuses to accept ACT Team support.  He asks instead to be provided with two bus passes so that he can leave from [facility] and kill himself.  This was further discussed with [name], who called [name], MD to staff pt with her.  She then reportedly spoke to [name], who came to speak to me and [name] in person.  [name] asked  that I call the [name] to speak with pt's care coordinator to see if an alternative treatment plan can be implemented.  At 11:15 I spoke with [name], pt's most recent care coordinator at the [facility], and explained pt's current situation with her.  She is familiar with the pt, reporting that he frequently presents with similar complaints and circumstances.  She reports that pt was recently discontinued from care coordination services because they were unable to contact him.  She indicates that she will staff pt's situation with her supervisor and call me back.  As of this writing her response is pending.  Doylene Canning, MA Triage Specialist [name] 9/30/201512:27 PM  Addendum: At 13:10 I called [name] to follow up with her.  Call rolled to voice mail and I left a message.  Still awaiting reply.  Doylene Canning, MA Triage Specialist 02/22/2014 @ 13:17  Addendum: At 13:22 [name] called back.  She reports that she is still waiting to speak with her senior staff in order to offer feedback.  She agrees to call me immediately thereafter.  Doylene Canning, MA Triage Specialist 02/22/2014 @ 13:25  Addendum: Because [name] believed that it was unlikely that any solutions could be found for pt's treatment needs without his cooperation, this Clinical research associate discussed other options with [name], MD.  Referring him to [facility] Coordinator [name] was considered, but it  was found that [facility] and that she will not be coming here today.  Dr [name] then decided to place the pt under outpatient commitment, and she initiated IVC paperwork.  At 15:19 these documents were faxed to [facility] [place] [name], who confirmed receipt.  The commitment was subsequently [place].  Pt was instructed to [facility] to [facility] tomorrow morning (Thursday 02/23/2014) at 08:00 to avoid being taken into custody  and to restart treatment.  Discharge instructions include contact information for [facility], along with the time and date of the appointment.  [facility].  Doylene Canninghomas Louanna Vanliew, MA Triage Specialist 02/22/2014 @ 17:72

## 2014-02-22 NOTE — ED Notes (Signed)
Pt was seen a couple days ago and had his medication changed and still reports feeling like he wants to cut himself.

## 2014-02-22 NOTE — BH Assessment (Signed)
Spoke with EDP, Wynetta EmeryNicole Pisciotta, PA-C who reports [facility] and is upset because his medication was changed. He has an OP [facility]C to [facility]. He reports [facility] does [facility]. Plan is to have him stay overnight and be seen in AM by psychiatry to see if they want to change his medication and then have [facility]. He is having command hallucinations and suicidal ideation.  Requested equipment be placed with pt for assessment. Equipment was currently in use. Nurse will call [phone] when equipment is placed with pt.   Assessment to commence when equipment is available.   Clista BernhardtNancy Hudsyn Champine, [facility] Triage Specialist 02/22/2014 10:45 PM

## 2014-02-22 NOTE — BH Assessment (Signed)
Relayed results of assessment to Donell SievertSpencer Simon, PA. Per Karleen HampshireSpencer, [place] [facility] so he can be rounded on in the AM by Dr. Ladona Ridgelaylor who is familiar with the pt. Then [facility] be [facility] per his OP IVC.   Spoke with EDP, Mardella LaymanNicole PA-C who will discuss plan with pt. If he is not agreeable to transfer she will order psych consult in AM.   Clista BernhardtNancy Dwan Hemmelgarn, [facility] Triage Specialist 02/22/2014 11:43 PM

## 2014-02-22 NOTE — ED Notes (Signed)
Staffing called to make aware of need for sitter.

## 2014-02-22 NOTE — BH Assessment (Signed)
Tele Assessment Note   [name] is an 53 y.o. male who was discharged from [facility] today with OP IVC to [facility], returned to ED due to believing he was given the wrong medication. He states he was given Risperdal and believes he should have been given Zyprexa. He reports SI, a/v hallucinations with command to kill himself. He reports he is thinking of cutting himself and does not feel he can keep himself safe. He is tired but oriented time 4. He reported during interview he thought "it seems like I am outside but I know I am inside." He denies HI. He denies any use of substances since his earlier discharge. Pt sts he just wants to go to sleep and see the doctor in the AM to see if he will be able to get the "right" medication.   Per most recent: Subjective:  [name] is a 53 y.o. male patient admitted with depression and suicidal ideations. Pt seen and chart reviewed. Pt continues to report suicidal thoughts. However, pt denied suicidal thoughts once transferred from [facility] to [facility]. Then, later affirmed suicidal ideation when Dr. Lovena Le informed the pt that he was discharging home. Pt spent the night in the OBS Unit without incident and intermittently cites auditory hallucinations, homelessness, and suicidal ideation as his stressors. Pt stated "if I can't come in here, ya'll might as well give me a bus pass so I can go home. I can't benefit from an Act Team... I don't want someone coming to my house, my momma won't like that". Pt is well-known to this NP, [facility] staff, and Dr. Dwyane Dee. Pt has met maximum therapeutic benefit from hospitalization at [facility] and we will seek other treatment options which may be more effective for pt such as ACT Team and outside referrals.  *Pt informed [facility] Tom with TTS that he "refuses Act Team services" and asked for "2 f-expletive bus passes so I can go kill myself". Pt is hostile and uncooperative. Tom with [facility] will reach out to [facility] which has been caring  for this pt.  HPI: [name] is an 53 y.o. male. Patient went to [place] about two weeks ago. He returned from visiting relatives in [place] two weeks ago. He came back to find that his girlfriend had broken up w/ him. He had also run out of his psychiatric medications while in [place]. He has not had any of his medications in two weeks. He has started back to drinking and is seeing spirits and hearing voices telling him to kill himself. Pt has been drinking two 6 packs per day and a pint of vodka daily over the last two weeks. He has thoughts of overdosing on Tylenol PM and ETOH. Pt denies any HI. Patient is seen at [facility] for medication monitoring. His last appointment there was a month ago. Patient needs to get stabilized on his medications.  Patient continues to express feeling depressed, hopeless, helpless, worthless, suicidal ideation with a plan to run into traffic. Patient cannot contract for safety and willing to be voluntary treatment in hospital for bipolar depression and substance abuse and possible withdrawal symptoms. Reportedly he has stomach upset, sweating, nausea and needs ativan for detox treatment. He affirms auditory hallucinations which commands to kill self, sees "spots and spirits" yet denies HI. Patient has multiple acute psych hospital and multiple emergency visits over the years  Axis I:  296.52 Bipolar I Disorder, Most Recent Episode Depressed  304.20 Cocaine Use  Disorder, Moderate  303.90 Alcohol Use Disorder, Severe  295.90 Schizophrenia per history Axis II: Deferred Axis III:  Past Medical History  Diagnosis Date  . Depression   . Arthritis   . Bipolar 1 disorder   . PTSD (post-traumatic stress disorder)   . Schizophrenia   . Balanitis    Axis IV: other psychosocial or environmental problems, problems with access to health care services and problems with primary support group Axis V: 31-40 impairment in reality testing  Past Medical History:   Past Medical History  Diagnosis Date  . Depression   . Arthritis   . Bipolar 1 disorder   . PTSD (post-traumatic stress disorder)   . Schizophrenia   . Balanitis     History reviewed. No pertinent past surgical history.  Family History: No family history on file.  Social History:  reports that he has quit smoking. He has never used smokeless tobacco. He reports that he drinks alcohol. He reports that he uses illicit drugs (Marijuana and Cocaine).  Additional Social History:  Alcohol / Drug Use Pain Medications: SEE MAR Prescriptions: SEE MAR, reports he was placed on risperdal and should be on Zyprexa Over the Counter: Tylenol PM History of alcohol / drug use?: Yes Longest period of sobriety (when/how long): 6 months for this last period Withdrawal Symptoms:  (none at this time) Substance #1 Name of Substance 1: ETOH 1 - Age of First Use: 53 years of age 75 - Amount (size/oz): Two 6 pack and some vodka (1 pint)  daily over the last 2 weeks 1 - Frequency: Daily use 1 - Duration: Rate over the last two weeks 1 - Last Use / Amount: just discharged from [facility] reports he has not consumed since discharge so last use was 9-25 2 six packs Substance #2 Name of Substance 2: Cocaine 2 - Age of First Use: 53 years of age 33 - Amount (size/oz): Varies 2 - Frequency: Infrequently 2 - Duration: Seldom 2 - Last Use / Amount: about 7 days ago one line  CIWA: CIWA-Ar BP: 117/79 mmHg Pulse Rate: 91 Nausea and Vomiting: mild nausea with no vomiting Tactile Disturbances: mild itching, pins and needles, burning or numbness Tremor: no tremor Auditory Disturbances: very mild harshness or ability to frighten Paroxysmal Sweats: barely perceptible sweating, palms moist Visual Disturbances: moderate sensitivity Anxiety: two Headache, Fullness in Head: none present Agitation: normal activity Orientation and Clouding of Sensorium: oriented and can do serial additions CIWA-Ar Total: 10 COWS:     PATIENT STRENGTHS: (choose at least two) Ability for insight Communication skills  Allergies: No Known Allergies  Home Medications:  (Not in a hospital admission)  OB/GYN Status:  No LMP for male patient.  General Assessment Data Location of Assessment: [facility] ED Is this a Tele or Face-to-Face Assessment?: Tele Assessment Is this an Initial Assessment or a Re-assessment for this encounter?: Initial Assessment Living Arrangements: Parent Can pt return to current living arrangement?: Yes Admission Status: Other (Comment) (OP IVC) Is patient capable of signing voluntary admission?: Yes Transfer from: Home Referral Source: Self/Family/Friend     [place] Living Arrangements: Parent Name of Psychiatrist: [facility] Name of Therapist: N/A  Education Status Is patient currently in school?: No Current Grade: na Highest grade of school patient has completed: na Name of school: na Contact person: na  Risk to self with the past 6 months Suicidal Ideation: Yes-Currently Present Suicidal Intent: Yes-Currently Present Is patient at risk for suicide?: Yes Suicidal Plan?: Yes-Currently Present Specify  Current Suicidal Plan: cut self  Access to Means: Yes Specify Access to Suicidal Means: sharp What has been your use of drugs/alcohol within the last 12 months?: pt has a history of abusing alcohol and cocaine Previous Attempts/Gestures: Yes How many times?:  (multiple) Other Self Harm Risks: none Triggers for Past Attempts: Unpredictable Intentional Self Injurious Behavior: None Family Suicide History: No Recent stressful life event(s):  (medication change) Persecutory voices/beliefs?: No Depression: Yes Depression Symptoms: Despondent;Feeling worthless/self pity;Loss of interest in usual pleasures;Feeling angry/irritable Substance abuse history and/or treatment for substance abuse?: Yes Suicide prevention information given to non-admitted patients: Yes  Risk to  Others within the past 6 months Homicidal Ideation: No Thoughts of Harm to Others: No Current Homicidal Intent: No Current Homicidal Plan: No Access to Homicidal Means: No Identified Victim: none History of harm to others?: No Assessment of Violence: None Noted Violent Behavior Description: none Does patient have access to weapons?: No Criminal Charges Pending?: No Does patient have a court date: No  Psychosis Hallucinations: Auditory;Visual;With command (to hurt himself) Delusions: None noted  Mental Status Report Appear/Hygiene: Unremarkable;In scrubs Eye Contact: Fair Motor Activity: Unremarkable Speech: Logical/coherent Level of Consciousness: Alert Mood: Depressed Affect: Appropriate to circumstance Anxiety Level: Minimal Thought Processes: Coherent;Relevant;Circumstantial Judgement: Impaired Orientation: Person;Place;Time;Situation Obsessive Compulsive Thoughts/Behaviors: None  Cognitive Functioning Concentration: Normal Memory: Recent Intact;Remote Intact IQ: Average Insight: Fair Impulse Control: Fair Appetite: Fair Weight Loss: 34 (reports lost 30 + pounds) Weight Gain: 0 Sleep: No Change Total Hours of Sleep: 6 Vegetative Symptoms: None  ADLScreening [facility] Assessment Services) Patient's cognitive ability adequate to safely complete daily activities?: Yes Patient able to express need for assistance with ADLs?: Yes Independently performs ADLs?: Yes (appropriate for developmental age)  Prior Inpatient Therapy Prior Inpatient Therapy: Yes Prior Therapy Dates: Multiple dates over last 10 years Prior Therapy Facilty/Provider(s): [facility], [facility], [facility], [facility] Reason for Treatment: depression & SI  Prior Outpatient Therapy Prior Outpatient Therapy: Yes Prior Therapy Dates: Off & on for a year Prior Therapy Facilty/Provider(s): [facility] Reason for Treatment: med monitoring  ADL Screening (condition at time of admission) Patient's cognitive ability adequate to  safely complete daily activities?: Yes Is the patient deaf or have difficulty hearing?: No Does the patient have difficulty seeing, even when wearing glasses/contacts?: No Does the patient have difficulty concentrating, remembering, or making decisions?: Yes Patient able to express need for assistance with ADLs?: Yes Does the patient have difficulty dressing or bathing?: No Independently performs ADLs?: Yes (appropriate for developmental age) Does the patient have difficulty walking or climbing stairs?: No Weakness of Legs: None Weakness of Arms/Hands: None  Home Assistive Devices/Equipment Home Assistive Devices/Equipment: None    Abuse/Neglect Assessment (Assessment to be complete while patient is alone) Physical Abuse: Denies Verbal Abuse: Yes, past (Comment) Sexual Abuse: Denies Exploitation of patient/patient's resources: Denies Self-Neglect: Denies Values / Beliefs Cultural Requests During Hospitalization: None Spiritual Requests During Hospitalization: None   Advance Directives (For Healthcare) Does patient have an advance directive?: No Would patient like information on creating an advanced directive?: No - patient declined information Nutrition Screen- [facility] Adult/WL/AP Patient's home diet: Regular  Additional Information 1:1 In Past 12 Months?: Yes CIRT Risk: Yes Elopement Risk: No Does patient have medical clearance?: Yes     Disposition:  Per Patriciaann Clan, PA pt should be transferred to [facility] to be rounded on by psychiatry in AM and then discharged to [facility] per OP IVC. Pt is not agreeable to transfer, EDP will or psychiatric consult for the AM.  Lear Ng, [facility] Triage Specialist 02/22/2014 11:55 PM

## 2014-02-22 NOTE — Progress Notes (Signed)
Pt alert and oriented. Pt was anxious this morning and received Ativan to help him relax. Pt did reports some relief. Pt grew anxious and agitated later in the day after speaking with the doctor. Pt continues to pace back and forth in an angry manner. Staff aware and has tried to de-escalate Pt. Pt is currently standing by his bed with arms folded. Staff will continue to monitor.

## 2014-02-22 NOTE — ED Notes (Signed)
Blood draw attempt x 1, unsuccessful

## 2014-02-22 NOTE — Discharge Instructions (Addendum)
In order to assure that you receive the outpatient treatment that you require for your behavioral health needs, you have been placed under Outpatient Commitment by [facility].  You are to present at [facility] to restart treatment with them on Thursday, February 23, 2014 at 8:00 am:       [facility]      [address]      [place], [place] [address]      [phone]  If you have a behavioral health crisis you have several options, all of which are available 24 hours a day, 7 days a week:  *Call Mobile Crisis and a clinician will come to you: [phone] *Call 911 *Go to the [facility] at [address], [place], [place]

## 2014-02-22 NOTE — ED Notes (Signed)
Phlebotomy in with pt in triage at this time.

## 2014-02-22 NOTE — Progress Notes (Signed)
Patient ID: [name], male   DOB: 04/01/1961, 53 y.o.   MRN: [id number] Discharge Note- he was asking to leave and Dr. [name] was called in to talk with him. He had said earlier today he was suicidal but he states now he is not and there is no point to waiting around here if he is not going to be admitted to the unit and he was told by the NP he would not be admitted per Dr due to his frequent admissions with out a marked improvement in his sx or behaviors.He is  Irritable and states he has been disrespected here and he is not a "hobo,I am not that kind of black man" He is pacing and restless. Dr. [name] petitioned him to an outpatient commitment and [employer] served papers on him. He became very angry, calling people names, refused to sign anything and taken across the hall to have his property returned and his belt and shoes were missing. Became very angry and [employer] standing by since he is cussing and throwing his property. With some effort found his property and returned them to him, gave him two bus tickets and sent him out. While here he made a mess in the bathroom by not flushing the toilet and not lifting the seat, other male patients complained.He states as leaving his mom was very upset and [facility] with [facility] and he was going right [facility] to [facility] to [facility] himself for admission and his mom would be calling the supervisor to complain. He never made any threats to hurt self or others in the discharge process and never showed any evidence of hallucinations. To follow up with [facility].
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
## 2014-02-22 NOTE — ED Provider Notes (Signed)
CSN: [id number]     Arrival date & time 02/22/14  1731 History   First MD Initiated Contact with Patient 02/22/14 1849     Chief Complaint  Patient presents with  . Suicidal     (Consider location/radiation/quality/duration/timing/severity/associated sxs/prior Treatment) HPI   [name] is a 53 y.o. male with past medical history significant for schizophrenia, PTSD, bipolar type I presenting to the ED voluntarily at his sister's request. Patient states that he was seen as an inpatient and his Zyprexa was changed to right upper, states that he is hearing voices and voices are telling him to cut himself and committed suicide he has a plan to jump in front of a bus. Patient states he has been drinking heavily over the course of the last 2 weeks. Denies any drug abuse. Patient has had issues with alcohol withdrawal, no DTs but he states that he has blacked out and had aches, denies seizure. Patient denies homicidal ideation, illicit drug use, audio or visual hallucinations, chest pain, shortness of breath, abdominal pain, nausea vomiting, change in bowel or bladder habits. He is requesting his medications be converted back to the previous regimen.   Past Medical History  Diagnosis Date  . Depression   . Arthritis   . Bipolar 1 disorder   . PTSD (post-traumatic stress disorder)   . Schizophrenia   . Balanitis    History reviewed. No pertinent past surgical history. No family history on file. History  Substance Use Topics  . Smoking status: Former Smoker -- 0.00 packs/day  . Smokeless tobacco: Never Used  . Alcohol Use: 0.0 oz/week     Comment: 2 six packs/day    Review of Systems  10 systems reviewed and found to be negative, except as noted in the HPI.   Allergies  Review of patient's allergies indicates no known allergies.  Home Medications   Prior to Admission medications   Medication Sig Start Date End Date Taking? Authorizing Provider  hydrocortisone cream 1 %  Apply 1 application topically as needed for itching.   Yes Historical Provider, MD  hydrOXYzine (VISTARIL) 25 MG capsule Take 25 mg by mouth 3 (three) times daily as needed for anxiety.   Yes Historical Provider, MD  ibuprofen (ADVIL,MOTRIN) 800 MG tablet Take 800 mg by mouth every 8 (eight) hours as needed for moderate pain.   Yes Historical Provider, MD  lamoTRIgine (LAMICTAL) 25 MG tablet Take 1 tablet (25 mg total) by mouth 2 (two) times daily. 02/22/14  Yes Beau FannyJohn C Withrow, FNP  LORazepam (ATIVAN) 1 MG tablet Take 1 mg by mouth 2 (two) times daily as needed for anxiety.   Yes Historical Provider, MD  valACYclovir (VALTREX) 500 MG tablet Take 500 mg by mouth 2 (two) times daily.   Yes Historical Provider, MD  zolpidem (AMBIEN) 10 MG tablet Take 10 mg by mouth at bedtime.   Yes Historical Provider, MD   BP 117/79  Pulse 91  Temp(Src) 97.9 F (36.6 C) (Oral)  Resp 16  Ht 6\' 3"  (1.905 m)  Wt 230 lb (104.327 kg)  BMI 28.75 kg/m2  SpO2 100% Physical Exam  Nursing note and vitals reviewed. Constitutional: He appears well-developed and well-nourished. No distress.  HENT:  Head: Normocephalic and atraumatic.  Mouth/Throat: Oropharynx is clear and moist.  Well-healed laceration to left temporal area  Eyes: Conjunctivae and EOM are normal. Pupils are equal, round, and reactive to light.  Neck: Normal range of motion.  Cardiovascular: Normal rate, regular rhythm and  intact distal pulses.   Pulmonary/Chest: Effort normal and breath sounds normal. No stridor. No respiratory distress. He has no wheezes. He has no rales. He exhibits no tenderness.  Abdominal: Soft. Bowel sounds are normal. He exhibits no distension and no mass. There is no tenderness. There is no rebound and no guarding.  Musculoskeletal: Normal range of motion.  Neurological: He is alert.  Psychiatric: His speech is rapid and/or pressured. He is hyperactive. He is not combative. He expresses suicidal ideation. He expresses no  homicidal ideation. He expresses suicidal plans. He expresses no homicidal plans.    ED Course  Procedures (including critical care time) Labs Review Labs Reviewed  CBC - Abnormal; Notable for the following:    WBC 3.6 (*)    All other components within normal limits  COMPREHENSIVE METABOLIC PANEL - Abnormal; Notable for the following:    GFR calc non Af Amer 72 (*)    GFR calc Af Amer 84 (*)    All other components within normal limits  URINE RAPID DRUG SCREEN (HOSP PERFORMED) - Abnormal; Notable for the following:    Cocaine POSITIVE (*)    All other components within normal limits  SALICYLATE LEVEL - Abnormal; Notable for the following:    Salicylate Lvl <2.0 (*)    All other components within normal limits  ACETAMINOPHEN LEVEL    Imaging Review No results found.   EKG Interpretation None      MDM   Final diagnoses:  Hallucination  Suicidal ideation    Filed Vitals:   02/22/14 1742 02/22/14 2001 02/22/14 2004  BP: 117/67 117/79 117/79  Pulse: 110 91 91  Temp: 98.1 F (36.7 C)  97.9 F (36.6 C)  TempSrc: Oral  Oral  Resp: 22  16  Height: 6\' 3"  (1.905 m)    Weight: 230 lb (104.327 kg)    SpO2: 98%  100%    Medications  alum & mag hydroxide-simeth (MAALOX/MYLANTA) 200-200-20 MG/5ML suspension 30 mL (not administered)  ondansetron (ZOFRAN) tablet 4 mg (not administered)  nicotine (NICODERM CQ - dosed in mg/24 hours) patch 21 mg (0 mg Transdermal Hold 02/22/14 2115)  zolpidem (AMBIEN) tablet 5 mg (5 mg Oral Not Given 02/22/14 2136)  ibuprofen (ADVIL,MOTRIN) tablet 600 mg (not administered)  acetaminophen (TYLENOL) tablet 650 mg (not administered)  ibuprofen (ADVIL,MOTRIN) tablet 800 mg (not administered)  lamoTRIgine (LAMICTAL) tablet 25 mg (25 mg Oral Given 02/22/14 2338)  valACYclovir (VALTREX) tablet 500 mg (500 mg Oral Given 02/22/14 2338)  zolpidem (AMBIEN) tablet 10 mg (10 mg Oral Given 02/22/14 2143)  hydrOXYzine (ATARAX/VISTARIL) tablet 25 mg (25 mg  Oral Given 02/22/14 2136)  LORazepam (ATIVAN) tablet 0-4 mg (not administered)  LORazepam (ATIVAN) tablet 0-4 mg (not administered)  LORazepam (ATIVAN) tablet 1 mg (1 mg Oral Given 02/22/14 2020)  ibuprofen (ADVIL,MOTRIN) tablet 400 mg (400 mg Oral Given 02/22/14 2020)    [name] is a 53 y.o. male presenting with auditory hallucinations, suicidal ideation with the plan. Patient was just discharged from behavioral health at 3 PM this afternoon. He is under outpatient IVC. Patient is requesting medication change from Risperdal to Zyprexa. For comfortable making this change, attending physician who evaluated him and explained that the medication management of his psychiatric medications must be a by psychiatrist. Patient is in the ED voluntarily. I've spoken with Karleen Hampshire: Plan is to keep him in the ED for psychiatric evaluation in the a.m. A spoken with Harriett Sine from TTS she requests that he be transferred  to [facility], he has refused transfer. Psychiatric consult initiated, they will see him later in the a.m. after they round at [facility]. Patient is here voluntarily comfortable, he just wants to sleep tonight. If he decides he wants to leave the weaning to be IVC'd.   This is a shared visit with the attending physician who personally evaluated the patient and agrees with the care plan.     Wynetta Emery, PA-C 02/23/14 0111

## 2014-02-23 NOTE — ED Notes (Addendum)
Per EDP and bh counselor, patient is OP IVC and to be [facility] via own transportation or police.

## 2014-02-23 NOTE — ED Provider Notes (Signed)
Medical screening examination/treatment/procedure(s) were conducted as a shared visit with non-physician practitioner(s) and myself.  I personally evaluated the patient during the encounter.   EKG Interpretation None      This a 53 year old male with history of schizophrenia, PTSD, bipolar disorder who presents with suicidal ideation. Patient was just discharged this afternoon from behavioral health. Outpatient IVC in place requiring the patient to [facility]. On my evaluation, patient is very frustrated that his medications were changed. He states he was doing well on Zyprexa and Ambien. He was switched respiratory during his recent hospitalization and is unhappy with this change. He reports that he is still hallucinating and "I know my body better than they do and I need my medicines."  Patient also reports suicidal ideation. This is chronic for him. He reports self cutting behaviors. Patient was brought back by his sister and mother who is concerned about his safety. Discussed with the patient that he has close outpatient psychiatric followup and that at this time I will not be making any adjustments in his medications. Given that he is refusing to take his respiridol and is continuing to endorse a suicide ideation, will have TTS evaluate as they know him well. Suspect this disposition will be the same with outpatient followup.  Shon Batonourtney F Horton, MD 02/23/14 1630

## 2014-02-23 NOTE — ED Notes (Signed)
Spoke with Perry JanusEva Stevenson at [facility] to clarify that patient is able to be discharged by himself with bus pass.  Patient is able to leave voluntarily and follow up with [facility] independently and does not need police escort if he has intention of actually going.  Patient states that he is going to go to [facility].  Given 2 bus passes.

## 2014-02-23 NOTE — ED Provider Notes (Signed)
8:35 AM I was called by Dr. [name] she states that she filled out outpatient commitment paperwork for the patient yesterday and that he is to be escorted from the hospital. She states that he is to go to [facility]. I notified him of this and he understands.    I have discussed the diagnosis/risks/treatment options with the patient and believe the pt to be eligible for discharge home to [facility]. We also discussed returning to the ED immediately if new or worsening sx occur. We discussed the sx which are most concerning (e.g., SI) that necessitate immediate return. Medications administered to the patient during their visit and any new prescriptions provided to the patient are listed below.  Medications given during this visit Medications  LORazepam (ATIVAN) tablet 1 mg (1 mg Oral Given 02/22/14 2020)  ibuprofen (ADVIL,MOTRIN) tablet 400 mg (400 mg Oral Given 02/22/14 2020)    Discharge Medication List as of 02/23/2014  8:36 AM     Clinical Impression 1. Hallucination   2. Suicidal ideation      Purvis SheffieldForrest Hashim Eichhorst, MD 02/23/14 1224

## 2014-02-23 NOTE — BH Assessment (Addendum)
Dr. [name] [facility] with [facility] and the patient will be discharged and [facility].

## 2014-02-27 NOTE — Discharge Summary (Signed)
Patient seen, evaluated by me.Patient can be discharged

## 2014-02-27 NOTE — H&P (Signed)
Case discussed, agree with plan 

## 2014-07-06 ENCOUNTER — Encounter (HOSPITAL_COMMUNITY): Payer: Self-pay | Admitting: Adult Health

## 2014-07-06 ENCOUNTER — Emergency Department (HOSPITAL_COMMUNITY)
Admission: EM | Admit: 2014-07-06 | Discharge: 2014-07-06 | Disposition: A | Discharge status: Home / Self Care | Payer: Medicaid Other | Attending: Emergency Medicine | Admitting: Emergency Medicine

## 2014-07-06 DIAGNOSIS — Z79899 Other long term (current) drug therapy: Secondary | ICD-10-CM | POA: Diagnosis not present

## 2014-07-06 DIAGNOSIS — N390 Urinary tract infection, site not specified: Secondary | ICD-10-CM | POA: Diagnosis not present

## 2014-07-06 DIAGNOSIS — N471 Phimosis: Secondary | ICD-10-CM

## 2014-07-06 DIAGNOSIS — Z7952 Long term (current) use of systemic steroids: Secondary | ICD-10-CM | POA: Diagnosis not present

## 2014-07-06 DIAGNOSIS — Z87891 Personal history of nicotine dependence: Secondary | ICD-10-CM | POA: Insufficient documentation

## 2014-07-06 DIAGNOSIS — R300 Dysuria: Secondary | ICD-10-CM | POA: Diagnosis present

## 2014-07-06 LAB — URINALYSIS, ROUTINE W REFLEX MICROSCOPIC
BILIRUBIN URINE: NEGATIVE
GLUCOSE, UA: NEGATIVE mg/dL
HGB URINE DIPSTICK: NEGATIVE
KETONES UR: NEGATIVE mg/dL
NITRITE: NEGATIVE
PROTEIN: NEGATIVE mg/dL
Specific Gravity, Urine: 1.023 (ref 1.005–1.030)
Urobilinogen, UA: 1 mg/dL (ref 0.0–1.0)
pH: 6.5 (ref 5.0–8.0)

## 2014-07-06 LAB — URINE MICROSCOPIC-ADD ON

## 2014-07-06 MED ORDER — CEPHALEXIN 500 MG PO CAPS: 500.0000 mg | ORAL_CAPSULE | Freq: Three times a day (TID) | ORAL | Status: DC

## 2014-07-06 MED ORDER — FLUCONAZOLE 100 MG PO TABS
150.0000 mg | ORAL_TABLET | Freq: Once | ORAL | Status: AC
  Administered 2014-07-06: 150 mg via ORAL
  Filled 2014-07-06: qty 2

## 2014-07-06 MED ORDER — HYDROCORTISONE 1 % EX CREA
TOPICAL_CREAM | Freq: Two times a day (BID) | CUTANEOUS | Status: DC
  Administered 2014-07-06: 20:00:00 via TOPICAL
  Filled 2014-07-06: qty 28

## 2014-07-06 NOTE — ED Provider Notes (Signed)
CSN: [id number]     Arrival date & time 07/06/14  1749 History  This chart was scribed for Eben Burow, PA-C working with Ethelda Chick, MD by Evon Slack, ED Scribe. This patient was seen in room TR10C/TR10C and the patient's care was started at 6:01 PM.      Chief Complaint  Patient presents with  . Urinary Tract Infection   HPI HPI Comments: [name] is a 54 y.o. male who presents to the Emergency Department complaining of dysuria onset 3 days prior. Pt states that he has associated urgency and some slight fatigue. Pt states that he feels as if he has an UTI. Pt states that he has a Hx of balanitis. Pt states that he is not circumcised. Pt denies any new sexual partners. Denies fever, chills, nausea, vomiting, abdominal pain, or penile discharge.    Past Medical History  Diagnosis Date  . Depression   . Arthritis   . Bipolar 1 disorder   . PTSD (post-traumatic stress disorder)   . Schizophrenia   . Balanitis    History reviewed. No pertinent past surgical history. History reviewed. No pertinent family history. History  Substance Use Topics  . Smoking status: Former Smoker -- 0.00 packs/day  . Smokeless tobacco: Never Used  . Alcohol Use: 0.0 oz/week     Comment: 2 six packs/day    Review of Systems  Constitutional: Negative for fever and chills.  Gastrointestinal: Negative for nausea, vomiting and abdominal pain.  Genitourinary: Positive for dysuria and urgency. Negative for discharge.  All other systems reviewed and are negative.     Allergies  Review of patient's allergies indicates no known allergies.  Home Medications   Prior to Admission medications   Medication Sig Start Date End Date Taking? Authorizing Provider  cephALEXin (KEFLEX) 500 MG capsule Take 1 capsule (500 mg total) by mouth 3 (three) times daily. 07/06/14   Samar Venneman A Forcucci, PA-C  hydrocortisone cream 1 % Apply 1 application topically as needed for itching.    Historical  Provider, MD  hydrOXYzine (VISTARIL) 25 MG capsule Take 25 mg by mouth 3 (three) times daily as needed for anxiety.    Historical Provider, MD  ibuprofen (ADVIL,MOTRIN) 800 MG tablet Take 800 mg by mouth every 8 (eight) hours as needed for moderate pain.    Historical Provider, MD  lamoTRIgine (LAMICTAL) 25 MG tablet Take 1 tablet (25 mg total) by mouth 2 (two) times daily. 02/22/14   Beau Fanny, FNP  LORazepam (ATIVAN) 1 MG tablet Take 1 mg by mouth 2 (two) times daily as needed for anxiety.    Historical Provider, MD  valACYclovir (VALTREX) 500 MG tablet Take 500 mg by mouth 2 (two) times daily.    Historical Provider, MD  zolpidem (AMBIEN) 10 MG tablet Take 10 mg by mouth at bedtime.    Historical Provider, MD   BP 104/63 mmHg  Pulse 65  Temp(Src) 98 F (36.7 C) (Oral)  Resp 18  Ht  (1.905 m)  Wt 232 lb (105.235 kg)  BMI 29.00 kg/m2  SpO2 99%   Physical Exam  Constitutional: He is oriented to person, place, and time. He appears well-developed and well-nourished. No distress.  HENT:  Head: Normocephalic and atraumatic.  Eyes: Conjunctivae and EOM are normal.  Neck: Neck supple. No tracheal deviation present.  Cardiovascular: Normal rate and regular rhythm.  Exam reveals no gallop and no friction rub.   No murmur heard. Pulmonary/Chest: Effort normal and breath  sounds normal. No respiratory distress. He has no wheezes. He has no rhonchi. He has no rales.  Abdominal: Soft. Bowel sounds are normal. He exhibits no mass. There is no tenderness. There is no rebound and no guarding.  Genitourinary: Testes normal. Right testis shows no mass, no swelling and no tenderness. Right testis is descended. Cremasteric reflex is not absent on the right side. Left testis shows no mass, no swelling and no tenderness. Left testis is descended. Cremasteric reflex is not absent on the left side. Uncircumcised. Phimosis present. No paraphimosis, hypospadias, penile erythema or penile tenderness. No  discharge found.  Chaperone present.  Musculoskeletal: Normal range of motion.  Neurological: He is alert and oriented to person, place, and time.  Skin: Skin is warm and dry.  Psychiatric: He has a normal mood and affect. His behavior is normal.  Nursing note and vitals reviewed.   ED Course  Procedures (including critical care time) DIAGNOSTIC STUDIES: Oxygen Saturation is 99% on RA, normal by my interpretation.    COORDINATION OF CARE: 6:08 PM-Discussed treatment plan with pt at bedside and pt agreed to plan.     Labs Review Labs Reviewed  URINALYSIS, ROUTINE W REFLEX MICROSCOPIC - Abnormal; Notable for the following:    Leukocytes, UA MODERATE (*)    All other components within normal limits  URINE MICROSCOPIC-ADD ON  GC/CHLAMYDIA PROBE AMP ([place])    Imaging Review No results found.   EKG Interpretation None      MDM   Final diagnoses:  Phimosis  UTI (lower urinary tract infection)   Patient's 54 year old male who presents emergency room for evaluation of dysuria and urinary frequency. Patient has a history balanitis. He is uncircumcised. He has been seen by a line serology has recommended circumcision to him and he was unsure whether he wanted to do this. Patient does have leukocytosis in his urine and possible urinary tract infection. GC is currently pending. On examination patient did have phimosis. We'll treat here with Diflucan to treat for possible balanitis. We'll discharge with hydrocortisone cream twice daily and have recommended gentle retraction of the foreskin. We'll also cover with Keflex 3 times a day 7 days for possible UTI. Patient to follow up with [facility]. He should return for inability urinate, flank pain, or intractable fevers. He states understanding and agreement at this time. Patient stable for discharge.  I personally performed the services described in this documentation, which was scribed in my presence. The recorded  information has been reviewed and is accurate.      Eben Burowourtney A Forcucci, PA-C 07/06/14 1938  Ethelda ChickMartha K Linker, MD 07/06/14 [phone]

## 2014-07-06 NOTE — ED Notes (Signed)
Presents with 2 days of burning with urination-denies penile discharge.

## 2014-07-06 NOTE — ED Notes (Signed)
PA at bedside.

## 2014-07-06 NOTE — Discharge Instructions (Signed)
Phimosis Phimosis is a tightening (constricting) of the foreskin over the head of the penis. In an uncircumcised male, the foreskin may be so tight that it cannot be easily pulled back over the head of the penis. This is common in young boys (up to 53 years old) but may occur at any age. Treatment is not needed right away as long as urine can be passed. This condition should improve on its own with time. It may follow infection or injury, or occur from poor cleaning under the foreskin.  TREATMENT  Treatment for phimosis usually begins after age 53. Conservative treatments can include steroid creams and ointments. Removing part of the foreskin (circumcision) may be required for severe cases that result in poor or no blood supply to the tip of the penis. HOME CARE INSTRUCTIONS   Do not try to force back the foreskin. This may cause scarring and make the condition worse.  Clean under the foreskin regularly. Specific Instructions for Babies In uncircumcised babies, the foreskin is normally tight. It usually does not start to loosen enough to pull back until the baby is at least 53 months old. Until then, treat as your health care provider directs. Later, you may gently pull back the foreskin during bathing to wash the penis.  SEEK MEDICAL CARE IF:   There is redness, swelling, or drainage from the foreskin. These are signs of infection.  There is pain when passing urine. SEEK IMMEDIATE MEDICAL CARE IF:  Urine has not been passed in 24 hours.  A fever develops. MAKE SURE YOU:  Understand these instructions.  Will watch the condition.  Will get help right away if the condition gets worse. Document Released: 05/09/2000 Document Revised: 05/17/2013 Document Reviewed: 10/04/2008 [facility] Patient Information 2015 [place], [place]. This information is not intended to replace advice given to you by your health care provider. Make sure you discuss any questions you have with your health care  provider.  Urinary Tract Infection Urinary tract infections (UTIs) can develop anywhere along your urinary tract. Your urinary tract is your body's drainage system for removing wastes and extra water. Your urinary tract includes two kidneys, two ureters, a bladder, and a urethra. Your kidneys are a pair of bean-shaped organs. Each kidney is about the size of your fist. They are located below your ribs, one on each side of your spine. CAUSES Infections are caused by microbes, which are microscopic organisms, including fungi, viruses, and bacteria. These organisms are so small that they can only be seen through a microscope. Bacteria are the microbes that most commonly cause UTIs. SYMPTOMS  Symptoms of UTIs may vary by age and gender of the patient and by the location of the infection. Symptoms in young women typically include a frequent and intense urge to urinate and a painful, burning feeling in the bladder or urethra during urination. Older women and men are more likely to be tired, shaky, and weak and have muscle aches and abdominal pain. A fever may mean the infection is in your kidneys. Other symptoms of a kidney infection include pain in your back or sides below the ribs, nausea, and vomiting. DIAGNOSIS To diagnose a UTI, your caregiver will ask you about your symptoms. Your caregiver also will ask to provide a urine sample. The urine sample will be tested for bacteria and white blood cells. White blood cells are made by your body to help fight infection. TREATMENT  Typically, UTIs can be treated with medication. Because most UTIs are caused by  a bacterial infection, they usually can be treated with the use of antibiotics. The choice of antibiotic and length of treatment depend on your symptoms and the type of bacteria causing your infection. HOME CARE INSTRUCTIONS  If you were prescribed antibiotics, take them exactly as your caregiver instructs you. Finish the medication even if you feel better  after you have only taken some of the medication.  Drink enough water and fluids to keep your urine clear or pale yellow.  Avoid caffeine, tea, and carbonated beverages. They tend to irritate your bladder.  Empty your bladder often. Avoid holding urine for long periods of time.  Empty your bladder before and after sexual intercourse.  After a bowel movement, women should cleanse from front to back. Use each tissue only once. SEEK MEDICAL CARE IF:   You have back pain.  You develop a fever.  Your symptoms do not begin to resolve within 3 days. SEEK IMMEDIATE MEDICAL CARE IF:   You have severe back pain or lower abdominal pain.  You develop chills.  You have nausea or vomiting.  You have continued burning or discomfort with urination. MAKE SURE YOU:   Understand these instructions.  Will watch your condition.  Will get help right away if you are not doing well or get worse. Document Released: 02/19/2005 Document Revised: 11/11/2011 Document Reviewed: 06/20/2011 [facility] Patient Information 2015 [place]. This information is not intended to replace advice given to you by your health care provider. Make sure you discuss any questions you have with your health care provider.   Emergency Department Resource Guide 1) Find a Doctor and Pay Out of Pocket Although you won't have to find out who is covered by your insurance plan, it is a good idea to ask around and get recommendations. You will then need to call the office and see if the doctor you have chosen will accept you as a new patient and what types of options they offer for patients who are self-pay. Some doctors offer discounts or will set up payment plans for their patients who do not have insurance, but you will need to ask so you aren't surprised when you get to your appointment.  2) Contact Your Local Health Department Not all health departments have doctors that can see patients for sick visits, but many do, so  it is worth a call to see if yours does. If you don't know where your local health department is, you can check in your phone book. The CDC also has a tool to help you locate your state's health department, and many state websites also have listings of all of their local health departments.  3) Find a Walk-in Clinic If your illness is not likely to be very severe or complicated, you may want to try a walk in clinic. These are popping up all over the country in pharmacies, drugstores, and shopping centers. They're usually staffed by nurse practitioners or physician assistants that have been trained to treat common illnesses and complaints. They're usually fairly quick and inexpensive. However, if you have serious medical issues or chronic medical problems, these are probably not your best option.  No Primary Care Doctor: - Call Health Connect at  [phone] - they can help you locate a primary care doctor that  accepts your insurance, provides certain services, etc. - Physician Referral Service- [phone]  Chronic Pain Problems: Organization         Address  Phone   Notes  [facility]  [phone] Patients need to be referred by their primary care doctor.   Medication Assistance: Organization         Address  Phone   Notes  [facility] Medication [facility] [address] [place] [place], [place] [address] [phone] --Must be a resident of [facility] -- Must have NO insurance coverage whatsoever (no Medicaid/ Medicare, etc.) -- The pt. MUST have a primary care doctor that directs their care regularly and follows them in the community   [facility]  [phone]   [employer]  [phone]    Agencies that provide inexpensive medical care: Organization         Address  Phone   Notes  [facility]  [phone]   [facility]    [phone]   [facility] [address] [place], [place] [address] [phone]   [facility] [address] [place], [place] [phone]   [facility]    [phone]   [facility]    [phone]   [facility]  [address], [place] Phone:  [phone], Fax:  [phone] Hours of Operation:  9 am - 6 pm, M-F.  Also accepts Medicaid/Medicare and self-pay.  [facility]  [address], [place] Phone: [phone], Fax: [phone]. Hours of Operation:  8:30 am - 5:30 pm, M-F.  Also accepts Medicaid and self-pay.  [facility] [place] [address] [place] Phone: [phone]   [facility] [address] Natasha Bence [place], [place] [phone], Ext. [phone] Mondays & Thursdays: 7-9 AM.  First 15 patients are seen on a first come, first serve basis.    Medicaid-accepting [facility] Providers:  Organization         Address  Phone   Notes  [facility] [address], [place] [phone] Also accepts self-pay patients.  [facility] [address] Laurell Josephs [place], [place]  [phone]   [facility] [address], [place] [phone]   [facility] [address] [place] [phone]   Renaye Rakers [address], [place]   [phone] Only accepts [place] Access [place] patients after they have their name applied to their card.   Self-Pay (no insurance) in [facility]:  Organization         Address  Phone   Notes  Sickle Cell Patients, [facility] [address] [place], [place] [phone]   [facility] [address] [place], [place] [phone]   [facility] [place]  [address],  [phone]   [facility]/Dr. Osei-Bonsu  [address] [place] or  [address], [place] [phone] Phone number for both [place] and [place] locations is the same.  [facility] [address], [place] [phone]   [facility] [address], [place] or [address] [phone]   [facility] [address], [place] [phone], phone; [phone], fax Sees patients 1st and 3rd Saturday of every month.  Must not qualify for public or private insurance (i.e. Medicaid, Medicare, [place] Health Choice,  Veterans' Benefits)  Household income should be no more than 200% of the poverty level The clinic cannot treat you if you are pregnant or think you are pregnant  Sexually transmitted diseases are not treated at the clinic.    Dental Care: Organization         Address  Phone  Notes  [facility] of [facility] [address] [place], [place] [phone] Accepts children up to age 66 who are enrolled in [place] or [place] Health Choice; pregnant women with a Medicaid card; and children who have applied for Medicaid or [place] Health Choice, but were declined, whose parents can pay a reduced fee at time of service.  [facility] of [facility]  [address], [place] [phone] Accepts children up to age 60 who are enrolled in [place] or [place] Health Choice; pregnant women with a Medicaid card; and children who have applied for Medicaid or [place] Health Choice, but were declined, whose parents can pay a reduced fee at time of service.  [facility]  [address] [place], [place] [phone] Patients are seen by appointment only. Walk-ins are not accepted. [facility] will see patients 63 years of age and older. Monday - Tuesday (8am-5pm) Most Wednesdays (8:30-5pm) $30 per visit, cash only  [facility]  [address], [facility] [phone] Patients are seen by appointment only. Walk-ins are not accepted. [facility] will see patients 48 years of age and older. One Wednesday Evening (Monthly: Volunteer Based).  $30 per visit, cash only  [employer]  [phone] for adults; Children under age 21, call Graduate Pediatric Dentistry at [phone]. Children aged 35-14, please call [phone] to request a pediatric application.  Dental services are provided in all areas of dental care including fillings, crowns and bridges, complete and partial dentures, implants, gum treatment, root canals, and extractions. Preventive care is also provided. Treatment is provided to both adults and children. Patients are selected via a lottery and there is often a waiting list.   [facility] [address] [place]  [phone] [email or web address]   [facility] [address] [place], [place] [phone], Ext. [phone] Second and Fourth Thursday of each month, opens at 6:30 AM; Clinic ends at 9 AM.  Patients are seen on a first-come first-served basis, and a limited number are seen during each clinic.   [facility]  [address] Ether Griffins [place], [place] [phone]   Eligibility Requirements You must have lived in [place], [place], or [place] counties for at least the last three months.   You cannot be eligible for state or federal sponsored [place], including [employer], [place], or [employer].   You generally cannot be eligible for healthcare insurance through your employer.    How to apply: Eligibility screenings are held every Tuesday and Wednesday afternoon from 1:00 pm until 4:00 pm. You do not need an appointment for the interview!  [facility] [address], [place], [place] [phone]   [facility] Health Department  [phone]   [facility] Health Department  [phone]   [facility]  Health Department  [phone]    Behavioral Health Resources in the Community: Intensive Outpatient Programs Organization         Address  Phone  Notes  [facility] [address] [place], [place] [phone]   [facility] [address], [place], [place] [phone]   [facility] [address], [place], [place]  [phone]   [facility] Mental Health [address]  [place], [place] [phone] or [phone]   Substance Abuse Resources Organization         Address  Phone  Notes  [facility]  [phone]   [facility]  [phone]   [facility] [place] [facility]  [phone]   Floydene Flock  [phone]   Residential & Outpatient Substance Abuse Program  [phone]   Psychological Services Organization         Address  Phone  Notes  [facility]al Health  [phone]   [facility]  [phone]   [facility] Mental Health [address] [place] [phone] or [phone]    Mobile Crisis Teams Organization         Address  Phone  Notes  [facility], Mobile Crisis Care Unit  [phone]   [facility]  [address] [place], [place] [phone]   Doristine Locks [address] [place] [phone]    Self-Help/Support Groups Organization         Address  Phone             Notes  [facility] [place] - variety of support groups  [phone]- I7437963 Call for more information  Narcotics Anonymous (NA), [facility] [address], [employer] [place]  2 meetings at this location   Statistician         Address  Phone  Notes  [facility] [address],    [place]  [phone]   [facility]  [address] [place] [address], [place], [place] [phone]   [facility] [address] [place], [place] [phone]  Admissions: 8am-3pm M-F  [facility] [address],    [place], [place] [phone]   [facility] [address] [place], [place], [place] [phone]   [facility] [address],  [place], [place] [phone]   [facility] - Intensive Outpatient [address] Laurell Josephs [address], [place], [place] [phone]   [facility] (Addiction Recovery [facility] [address] [place]  [place], [place] [phone] or [phone]   [facility] ([facility]) [address], [place], [place] [phone] Accepts Medicaid  [place] [facility] [address],  [place] [phone] Substance Abuse/Addiction Treatment   [facility]         Address  Phone  Notes  [facility]  [phone]   Angie Fava, PhD [address] Ervin Knack [place], [place]   [phone] or [phone]   [facility] Behavioral   [address] [place], [place] [phone]   [facility] [address], [place], [place] [phone] Insurance/Medicaid/sponsorship through [facility] and Families [address]                                    [place], [place] [phone] Therapy/tele-psych/case  [facility] [address], [place] [phone]    Dr. Lolly Mustache  [phone]   [facility]  [employer] [facility] Dept. 1) [address] [place] 2)  [place] 3)  [address], [place] [phone]  [phone]   [facility] Child Abuse Hotline [phone]) [phone] or [phone] (After Hours)

## 2014-07-06 NOTE — ED Notes (Signed)
Pt reports pain and burning and some frequency with urination for a few days, denies n/v, fever or chills.

## 2014-07-07 LAB — GC/CHLAMYDIA PROBE AMP (~~LOC~~) NOT AT ARMC
Chlamydia: NEGATIVE
Neisseria Gonorrhea: NEGATIVE

## 2014-09-16 NOTE — Consult Note (Signed)
PATIENT NAME:  [name], [name] MR#:  [id number] DATE OF BIRTH:  01/05/[name]  DATE OF CONSULTATION:  10/26/2013  REFERRING PHYSICIAN:   CONSULTING PHYSICIAN:  Audery AmelJohn T. Kindsey Eblin, MD  IDENTIFYING INFORMATION AND REASON FOR CONSULT: A 54 year old man with a  supposed history of chronic mental health problems. Consultation because of alleged suicidal ideation and involuntary commitment.   HISTORY OF PRESENT ILLNESS: Information obtained from the patient and the chart. The patient reports that he was traveling from [place] heading back to [place]. He got off the train in [place] for reasons that are unclear. He was feeling suicidal at that time and he went to lie down on the railroad tracks. Law enforcement picked him up and brought him to the hospital. The patient says his mood has been feeling depressed for a couple of weeks. He feels "hopeless and helpless" and has had thoughts about killing himself. Thought about overdosing, but he is out of his medicine. Thought about laying down on a railroad track. His sleep is poor. Appetite is poor.  General energy level is poor. He says that he has had hallucinations at times, although he is vague about them.  He claims that he has been prescribed psychiatric medicine but has been off them for a couple of weeks. He finally admits that he also had a 4-day binge on cocaine for the last few days. Denies that he been using alcohol. Denies that he uses drugs regularly. He is requesting to get back on his medication and insisting that he has suicidal ideation.   PAST PSYCHIATRIC HISTORY: Somewhat vague. He says that he has had hospitalizations before and has had suicide attempts in the past. Last suicide attempt about 2 years ago. Says he has been hospitalized at mostly facilities in [place]. He has been diagnosed with both bipolar disorder and schizoaffective disorder, also with a history of substance abuse, including alcohol in the past, and history of  personality disorder. He was last at our facility in 2010. He has a history of self-mutilation as well.   SUBSTANCE ABUSE HISTORY: The patient minimizes it but does admit to a recent binge on cocaine. Has a past history of alcohol abuse on his prior admission.   SOCIAL HISTORY: He says he is going back to [place] to live, but it is not clear whether he really has a place to stay. He is vague about whether he has actually spoken to his mother. He does get disability.   MEDICAL HISTORY: Has a history of self-mutilation. No other acute or ongoing chronic medical problems.   CURRENT MEDICINES:  He says he is on Lamictal, Vistaril, and Ambien and does not know the doses at first. After we discussed it, he decided he is on 25 mg a day of Lamictal, 50 mg q. 8 hours p.r.n. of Vistaril, and 10 mg of Ambien at night.   ALLERGIES: No known drug allergies.   REVIEW OF SYSTEMS: Depression, feeling "hopeless." Claims he has auditory hallucinations. Suicidal ideation. No acute physical symptoms to speak of. No pain, no malaise. No fever or chills. No GI, cardiac, or pulmonary symptoms.   MENTAL STATUS EXAMINATION: Reasonably well groomed gentleman, looks his stated age, cooperative with the interview. Eye contact good. Psychomotor activity normal. Speech normal rate, tone, and volume. Affect is a little bit blunted and constricted. Mood stated as being depressed. Thoughts are logical for the most part. Does not make any obviously bizarre statements. Says he does have hallucinations. Does not look like  he is responding to internal stimuli. Endorses suicidal ideation. No homicidal ideation. Alert and oriented x 4. Short and long-term memory intact. Judgment and insight appear to be reasonably stable.   LABORATORY RESULTS: Drug screen positive for cocaine. Urinalysis looks actually like he may be having an infection, but it is not clear. We should probably repeat it. The CBC is normal. Acetaminophen and salicylates  negative.   VITAL SIGNS: Blood pressure 131/66, respirations 20, pulse 83, temperature 98.2.   ASSESSMENT:  This is a 54 year old man with chronic mental illness which may have recently been exacerbated by his substance abuse and being homeless. He is claiming suicidal ideation but appears to be mentally stable with no acute behavior problems. He wants to be back on his medicine. Right now, we do not have an available bed to admit him to psychiatry.   TREATMENT PLAN: Continue IVC. Restart the medications at the doses he quoted. Supportive therapy and discussed followup plan with him. Re-evaluate tomorrow to see if he still needs admission.   DIAGNOSIS, PRINCIPAL AND PRIMARY:  AXIS I: Bipolar disorder, not otherwise specified.   SECONDARY DIAGNOSES:  AXIS I: Cocaine abuse. History of alcohol abuse.  AXIS II: Narcissistic personality traits.  AXIS III: Rule out urinary tract infection. AXIS IV: Severe from homelessness.  AXIS V: Functioning at time of evaluation 40.     ____________________________ Audery Amel, MD jtc:dd D: 10/26/2013 19:30:16 ET T: 10/26/2013 19:48:49 ET JOB#: [id number]  cc: Audery Amel, MD, <Dictator> Audery Amel MD ELECTRONICALLY SIGNED 11/09/2013 13:25

## 2014-09-16 NOTE — Consult Note (Signed)
Brief Consult Note: Diagnosis: Bipolar Disorder NOS.   Patient was seen by consultant.   Recommend further assessment or treatment.   Comments: Pt seen in ED BHU. He stated that he continues to have AH and SI and is feeling depressed. He laid on the rail road tracks as he having thoughts to harm himself. Unable to contract for safety. Feeling suicidal, depressed, hopeless at this time. He stated that he just started his medications last night and they are not effective yet.   Plan: Pt will be admitted to inpt unit when the bed becomes available.  Continue medications at this time.  Will continue to monitor.  Electronic Signatures: Rhunette CroftFaheem, Janaye [employer] S (MD)  (Signed 04-Jun-15 10:17)  Authored: Brief Consult Note   Last Updated: 04-Jun-15 10:17 by Rhunette CroftFaheem, Evon Lopezperez S (MD)
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
## 2015-01-26 ENCOUNTER — Encounter (HOSPITAL_COMMUNITY): Payer: Self-pay | Admitting: Family Medicine

## 2015-01-26 ENCOUNTER — Emergency Department (HOSPITAL_COMMUNITY)
Admission: EM | Admit: 2015-01-26 | Discharge: 2015-01-30 | Disposition: A | Discharge status: Rehabilitation Facility | Payer: Medicaid Other | Attending: Emergency Medicine | Admitting: Emergency Medicine

## 2015-01-26 DIAGNOSIS — F1420 Cocaine dependence, uncomplicated: Secondary | ICD-10-CM | POA: Diagnosis present

## 2015-01-26 DIAGNOSIS — F1310 Sedative, hypnotic or anxiolytic abuse, uncomplicated: Secondary | ICD-10-CM | POA: Insufficient documentation

## 2015-01-26 DIAGNOSIS — Z87438 Personal history of other diseases of male genital organs: Secondary | ICD-10-CM | POA: Insufficient documentation

## 2015-01-26 DIAGNOSIS — F1410 Cocaine abuse, uncomplicated: Secondary | ICD-10-CM | POA: Insufficient documentation

## 2015-01-26 DIAGNOSIS — F1994 Other psychoactive substance use, unspecified with psychoactive substance-induced mood disorder: Secondary | ICD-10-CM | POA: Diagnosis not present

## 2015-01-26 DIAGNOSIS — Z79899 Other long term (current) drug therapy: Secondary | ICD-10-CM | POA: Insufficient documentation

## 2015-01-26 DIAGNOSIS — R45851 Suicidal ideations: Secondary | ICD-10-CM | POA: Diagnosis not present

## 2015-01-26 DIAGNOSIS — R443 Hallucinations, unspecified: Secondary | ICD-10-CM | POA: Diagnosis present

## 2015-01-26 DIAGNOSIS — F329 Major depressive disorder, single episode, unspecified: Secondary | ICD-10-CM | POA: Insufficient documentation

## 2015-01-26 DIAGNOSIS — F419 Anxiety disorder, unspecified: Secondary | ICD-10-CM | POA: Insufficient documentation

## 2015-01-26 DIAGNOSIS — M1990 Unspecified osteoarthritis, unspecified site: Secondary | ICD-10-CM | POA: Diagnosis not present

## 2015-01-26 DIAGNOSIS — Z87891 Personal history of nicotine dependence: Secondary | ICD-10-CM | POA: Insufficient documentation

## 2015-01-26 DIAGNOSIS — T1491XA Suicide attempt, initial encounter: Secondary | ICD-10-CM

## 2015-01-26 DIAGNOSIS — F1020 Alcohol dependence, uncomplicated: Secondary | ICD-10-CM | POA: Diagnosis present

## 2015-01-26 DIAGNOSIS — R4589 Other symptoms and signs involving emotional state: Secondary | ICD-10-CM

## 2015-01-26 LAB — COMPREHENSIVE METABOLIC PANEL
ALBUMIN: 4.3 g/dL (ref 3.5–5.0)
ALT: 19 U/L (ref 17–63)
ANION GAP: 10 (ref 5–15)
AST: 36 U/L (ref 15–41)
Alkaline Phosphatase: 59 U/L (ref 38–126)
BUN: 12 mg/dL (ref 6–20)
CHLORIDE: 105 mmol/L (ref 101–111)
CO2: 24 mmol/L (ref 22–32)
Calcium: 9.1 mg/dL (ref 8.9–10.3)
Creatinine, Ser: 1.01 mg/dL (ref 0.61–1.24)
GFR calc non Af Amer: >60 mL/min (ref >60)
GLUCOSE: 88 mg/dL (ref 65–99)
Potassium: 3.5 mmol/L (ref 3.5–5.1)
SODIUM: 139 mmol/L (ref 135–145)
Total Bilirubin: 0.7 mg/dL (ref 0.3–1.2)
Total Protein: 7.4 g/dL (ref 6.5–8.1)

## 2015-01-26 LAB — RAPID URINE DRUG SCREEN, HOSP PERFORMED
AMPHETAMINES: NOT DETECTED
BENZODIAZEPINES: NOT DETECTED
Barbiturates: NOT DETECTED
COCAINE: POSITIVE — AB
OPIATES: NOT DETECTED
TETRAHYDROCANNABINOL: NOT DETECTED

## 2015-01-26 LAB — CBC
HCT: 47.5 % (ref 39.0–52.0)
HEMOGLOBIN: 15.7 g/dL (ref 13.0–17.0)
MCH: 31.5 pg (ref 26.0–34.0)
MCHC: 33.1 g/dL (ref 30.0–36.0)
MCV: 95.4 fL (ref 78.0–100.0)
Platelets: 198 10*3/uL (ref 150–400)
RBC: 4.98 MIL/uL (ref 4.22–5.81)
RDW: 12.7 % (ref 11.5–15.5)
WBC: 7.3 10*3/uL (ref 4.0–10.5)

## 2015-01-26 LAB — ACETAMINOPHEN LEVEL

## 2015-01-26 LAB — SALICYLATE LEVEL

## 2015-01-26 LAB — ETHANOL: Alcohol, Ethyl (B): 91 mg/dL — ABNORMAL HIGH (ref <5)

## 2015-01-26 MED ORDER — LORAZEPAM 1 MG PO TABS
1.0000 mg | ORAL_TABLET | Freq: Two times a day (BID) | ORAL | Status: DC | PRN
  Administered 2015-01-27 – 2015-01-30 (×5): 1 mg via ORAL
  Filled 2015-01-26 (×5): qty 1

## 2015-01-26 MED ORDER — ZOLPIDEM TARTRATE 5 MG PO TABS
10.0000 mg | ORAL_TABLET | Freq: Every day | ORAL | Status: DC
  Administered 2015-01-26 – 2015-01-29 (×4): 10 mg via ORAL
  Filled 2015-01-26 (×4): qty 2

## 2015-01-26 MED ORDER — LAMOTRIGINE 25 MG PO TABS
25.0000 mg | ORAL_TABLET | Freq: Two times a day (BID) | ORAL | Status: DC
  Administered 2015-01-26 – 2015-01-30 (×8): 25 mg via ORAL
  Filled 2015-01-26 (×11): qty 1

## 2015-01-26 MED ORDER — HYDROXYZINE HCL 25 MG PO TABS
25.0000 mg | ORAL_TABLET | Freq: Three times a day (TID) | ORAL | Status: DC | PRN
  Administered 2015-01-27 – 2015-01-30 (×6): 25 mg via ORAL
  Filled 2015-01-26 (×6): qty 1

## 2015-01-26 MED ORDER — VALACYCLOVIR HCL 500 MG PO TABS
500.0000 mg | ORAL_TABLET | Freq: Two times a day (BID) | ORAL | Status: DC
  Administered 2015-01-26 – 2015-01-30 (×8): 500 mg via ORAL
  Filled 2015-01-26 (×12): qty 1

## 2015-01-26 MED ORDER — IBUPROFEN 400 MG PO TABS
400.0000 mg | ORAL_TABLET | Freq: Four times a day (QID) | ORAL | Status: DC | PRN
  Administered 2015-01-26 – 2015-01-30 (×3): 400 mg via ORAL
  Filled 2015-01-26 (×3): qty 1

## 2015-01-26 MED ORDER — ACETAMINOPHEN 325 MG PO TABS
650.0000 mg | ORAL_TABLET | Freq: Four times a day (QID) | ORAL | Status: DC | PRN
  Administered 2015-01-26 – 2015-01-29 (×2): 650 mg via ORAL
  Filled 2015-01-26 (×2): qty 2

## 2015-01-26 NOTE — ED Provider Notes (Signed)
CSN: [id number]     Arrival date & time 01/26/15  1530 History   First MD Initiated Contact with Patient 01/26/15 1551     Chief Complaint  Patient presents with  . Suicidal   Patient is a 54 y.o. male presenting with general illness. The history is provided by the patient.  Illness Location:  NA Quality:  SI and attempt Severity:  Severe Onset quality:  Gradual Timing:  Constant Progression:  Worsening Chronicity:  Recurrent Context:  PMHx of depression, PTSD, and bipolar disorder (taking Lamictal, Vistaril, and Ambien) presenting today with SI and attempt. Patient has Hx of previous self-mutilation w/ cutting on face/arms and suicide attempt with sleeping medications. Patient was at father's funeral in [place] this weekend & lost medications. Patient began experiencing auditory hallucinations & states that he took his brother's gun and attempted to shoot himself. Patient has continued to experience SI and his brother brought him to the ED  Associated symptoms: no abdominal pain, no chest pain, no cough, no diarrhea, no fever, no headaches, no loss of consciousness, no nausea, no shortness of breath and no vomiting     Past Medical History  Diagnosis Date  . Depression   . Arthritis   . Bipolar 1 disorder   . PTSD (post-traumatic stress disorder)   . Schizophrenia   . Balanitis    History reviewed. No pertinent past surgical history. History reviewed. No pertinent family history. Social History  Substance Use Topics  . Smoking status: Former Smoker -- 0.00 packs/day  . Smokeless tobacco: Never Used  . Alcohol Use: 0.0 oz/week     Comment: 2 six packs/day    Review of Systems  Constitutional: Negative for fever and chills.  Respiratory: Negative for cough and shortness of breath.   Cardiovascular: Negative for chest pain.  Gastrointestinal: Negative for nausea, vomiting, abdominal pain and diarrhea.  Neurological: Negative for loss of consciousness and headaches.   Psychiatric/Behavioral: Positive for suicidal ideas, hallucinations and dysphoric mood.  All other systems reviewed and are negative.   Allergies  Lithium and Hydrocodone-acetaminophen  Home Medications   Prior to Admission medications   Medication Sig Start Date End Date Taking? Authorizing Provider  hydrOXYzine (VISTARIL) 25 MG capsule Take 25 mg by mouth 3 (three) times daily as needed for anxiety.   Yes Historical Provider, MD  ibuprofen (ADVIL,MOTRIN) 200 MG tablet Take 400 mg by mouth every 6 (six) hours as needed for moderate pain.   Yes Historical Provider, MD  lamoTRIgine (LAMICTAL) 25 MG tablet Take 1 tablet (25 mg total) by mouth 2 (two) times daily. 02/22/14  Yes Beau Fanny, FNP  LORazepam (ATIVAN) 1 MG tablet Take 1 mg by mouth 2 (two) times daily as needed for anxiety.   Yes Historical Provider, MD  valACYclovir (VALTREX) 500 MG tablet Take 500 mg by mouth 2 (two) times daily.   Yes Historical Provider, MD  zolpidem (AMBIEN) 10 MG tablet Take 10 mg by mouth at bedtime.   Yes Historical Provider, MD  cephALEXin (KEFLEX) 500 MG capsule Take 1 capsule (500 mg total) by mouth 3 (three) times daily. 07/06/14   Courtney Forcucci, PA-C   BP 119/69 mmHg  Pulse 92  Temp(Src) 98.4 F (36.9 C)  Resp 18  SpO2 97%   Physical Exam  Constitutional: He is oriented to person, place, and time. He appears well-developed and well-nourished. He appears distressed (tearful).  HENT:  Head: Normocephalic and atraumatic.  Eyes: Conjunctivae are normal. Pupils are equal, round, and  reactive to light.  Neck: Normal range of motion. Neck supple.  Cardiovascular: Normal rate and intact distal pulses.   Pulmonary/Chest: Effort normal and breath sounds normal.  Abdominal: Soft. Bowel sounds are normal. He exhibits no distension. There is no tenderness.  Musculoskeletal: Normal range of motion.  Neurological: He is alert and oriented to person, place, and time.  Skin: Skin is warm and dry. He  is not diaphoretic.  Psychiatric: His speech is normal. Judgment normal. Thought content is not paranoid and not delusional. Cognition and memory are normal. He exhibits a depressed mood. He expresses suicidal ideation. He expresses no homicidal ideation. He expresses suicidal plans. He expresses no homicidal plans.    ED Course  Procedures   Labs Review Labs Reviewed  ETHANOL - Abnormal; Notable for the following:    Alcohol, Ethyl (B) 91 (*)    All other components within normal limits  ACETAMINOPHEN LEVEL - Abnormal; Notable for the following:    Acetaminophen (Tylenol), Serum <10 (*)    All other components within normal limits  URINE RAPID DRUG SCREEN, HOSP PERFORMED - Abnormal; Notable for the following:    Cocaine POSITIVE (*)    All other components within normal limits  COMPREHENSIVE METABOLIC PANEL  SALICYLATE LEVEL  CBC   Imaging Review No results found. I have personally reviewed and evaluated these images and lab results as part of my medical decision-making.   EKG Interpretation None      MDM  Mr. page is a 54 yo male with PMHx of depression, PTSD, and bipolar disorder (taking Lamictal, Vistaril, and Ambien) presenting today with SI and attempt. Patient has Hx of previous self-mutilation w/ cutting on face/arms and suicide attempt with sleeping medications. Patient was at father's funeral in [place] this weekend & lost medications. Patient began experiencing auditory hallucinations & states that he took his brother's gun and attempted to shoot himself. Patient has continued to experience SI and his brother brought him to the ED for this reason. Patient denies homicidal ideations or self-mutilation.  Exam above notable for middle-aged male lying in stretcher. Tearful. Afebrile. Heart rate in 90s. Breathing well on room air and maintaining saturations without supple mental oxygen. Mild hypertension with SBP in the 140s. Abdomen benign. Lungs CTAB. Remainder of examination  unremarkable.   Labs sent - alcohol level 91, UDS positive for cocaine, and remainder of labs unremarkable. Suicide precautions ordered. Diet ordered. Psych consult and evaluated the patient in the emergency department with recommendations to admit patient. There is currently no beds available at our facility so patient is awaiting placement at another hospital.  Patient care discussed with and followed by my attending, Dr. Blake Divine  Final diagnoses:  Suicidal ideations  Depressed mood  Suicide attempt    Angelina Ok, MD 01/26/15 1610  Blake Divine, MD 01/28/15 2014

## 2015-01-26 NOTE — ED Notes (Signed)
Sitter given 30 minute break.  ED tech Stephanie sitting.

## 2015-01-26 NOTE — BHH Counselor (Signed)
[facility] Assessment Progress Note  Per May Agustin, NP, pt meets criteria for IP hospitalization. There are no appropriate beds at [facility]. TTS will seek placement. Counselor informed pt's nurse, Shawna Orleans, of the disposition and she indicated that she would advise the EDP.   Johny Shock. Ladona Ridgel, MS, NCC, LPCA Counselor
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
## 2015-01-26 NOTE — ED Provider Notes (Signed)
I saw and evaluated the patient, reviewed the resident's note and I agree with the findings and plan.   EKG Interpretation None      54 yo male with SI with plan to shoot himself, also hearing voices.  On exam, well appearing, nontoxic, not distressed, normal respiratory effort, normal perfusion, flat affect, depressed mood, suicidal.  TTS consult obtained and have recommended inpatient treatment.   Clinical Impression: 1. Suicidal ideations   2. Depressed mood   3. Suicide attempt       Blake Divine, MD 01/28/15 [phone]

## 2015-01-26 NOTE — ED Notes (Signed)
Sitter at bedside.

## 2015-01-26 NOTE — ED Notes (Signed)
Pt sts suicidal. sts he was recently at fathers funeral and wanted to get his brothers gun and shoot himself. sts  He has been off his meds x 1 week

## 2015-01-26 NOTE — ED Notes (Signed)
Staffing called for sitter.   

## 2015-01-26 NOTE — Progress Notes (Signed)
Patient has been referred to the following facilities: [facility]- per Cam, fax referral for review.  [facility]- per intake, fax referral for review [facility]- per intake, fax referral for review [facility]- per Baldo Ash, fax referral over for review. [facility]- per [place], fax referral for review.  [facility]- Per Sunny Schlein, currently beds available. Fax referral for review.  [facility]- per intake, fax referral for review.  [facility]- per intake, fax referral for review.   At capacity:  [facility], [facility], [facility], [facility]  CSW will continue to seek placement.   Fernande Boyden, LCSWA Clinical Social Worker [facility]

## 2015-01-27 NOTE — BH Assessment (Signed)
[facility] Assessment Progress Note  Completed reassessment on pt since he has been in ED 24 hrs, and pt continues to endorse SI and states he is "trying to find a reason to live.. I am cut off from my family, and I am depressed since my father died. I keep seeing my grandmother's spirit". He denies wanting to harm anyone else. Pt appeared calm and was cooperative during assessment, and there was no evidence pt was responding to internal stimuli.  [name], NP recommends continuing to seek IP treatment.

## 2015-01-27 NOTE — ED Notes (Signed)
Atarax given as requested. Pt also asked for Valium - advised pt no Valium ordered - voiced understanding. Pt asking if may take 2 - 3 days to find him placement. Advised pt yes and asked if this would be a problem for him. Pt voiced no - advised that this works out good for him.

## 2015-01-27 NOTE — ED Notes (Signed)
Report given to Susanna, RN

## 2015-01-28 NOTE — BH Assessment (Signed)
Reassessment - Pt is cooperative and oriented x 4. He continues to endorse SI. He endorses AH and VH. Pt says that he hears voices which say "you have nothing to live for" and "Get the gun. Get the knife." Pt reports he sees "faces" on the wall of his hospital room. He sts that his dad died one week ago and pt went to his funeral in [place]. Pt sts he lost his psych meds on the trip. He says he has been living with a friend in a hotel room. Writer informs him that TTS continues to look for inpatient placement. Pt replies that he found his admission to [facility] was helpful. He endorses poor appetite and sts he didn't sleep well last night.   Per website search, pt has a court date 02/14/15 for misdemeanor 2nd degree trespassing.   Evette Cristal, [place] Therapeutic Triage Specialist

## 2015-01-28 NOTE — ED Notes (Signed)
TTS being performed by Idalia Needle, [facility].

## 2015-01-29 DIAGNOSIS — R45851 Suicidal ideations: Secondary | ICD-10-CM | POA: Diagnosis not present

## 2015-01-29 DIAGNOSIS — F1994 Other psychoactive substance use, unspecified with psychoactive substance-induced mood disorder: Secondary | ICD-10-CM | POA: Diagnosis not present

## 2015-01-29 DIAGNOSIS — F329 Major depressive disorder, single episode, unspecified: Secondary | ICD-10-CM | POA: Diagnosis not present

## 2015-01-29 NOTE — ED Notes (Signed)
Patient request to talk to a chaplain.Chaplain in room at this time talking to patient.

## 2015-01-29 NOTE — Progress Notes (Signed)
   01/29/15 1046  Clinical Encounter Type  Visited With Patient;Health care provider  Visit Type Initial;Spiritual support;Behavioral Health  Referral From Patient  Spiritual Encounters  Spiritual Needs Prayer  Stress Factors  Patient Stress Factors Exhausted;Family relationships;Lack of caregivers   Patient requested to meet with chaplain, chaplain visited with patient. Patient expressed feelings of isolation, hopelessness, and asked for prayers for forgiveness. Chaplain prayed with patient. Chaplain support available as needed. Pager - [phone]   Alda Ponder, Chaplain 01/29/2015 10:49 AM

## 2015-01-29 NOTE — ED Notes (Signed)
Pt resting, given medications; sitter at bedside
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
## 2015-01-29 NOTE — ED Notes (Signed)
Patient was given a snack and drink at 3pm. 

## 2015-01-30 ENCOUNTER — Emergency Department (EMERGENCY_DEPARTMENT_HOSPITAL)
Admission: EM | Admit: 2015-01-30 | Discharge: 2015-02-01 | Disposition: A | Discharge status: Rehabilitation Facility | Payer: Medicaid Other | Source: Home / Self Care | Attending: Emergency Medicine | Admitting: Emergency Medicine

## 2015-01-30 ENCOUNTER — Ambulatory Visit (HOSPITAL_COMMUNITY): Admission: RE | Admit: 2015-01-30 | Payer: Medicaid Other | Source: Home / Self Care | Admitting: Psychiatry

## 2015-01-30 ENCOUNTER — Encounter (HOSPITAL_COMMUNITY): Payer: Self-pay | Admitting: Emergency Medicine

## 2015-01-30 DIAGNOSIS — F1994 Other psychoactive substance use, unspecified with psychoactive substance-induced mood disorder: Secondary | ICD-10-CM

## 2015-01-30 DIAGNOSIS — R45851 Suicidal ideations: Secondary | ICD-10-CM | POA: Diagnosis not present

## 2015-01-30 DIAGNOSIS — F329 Major depressive disorder, single episode, unspecified: Secondary | ICD-10-CM

## 2015-01-30 DIAGNOSIS — F32A Depression, unspecified: Secondary | ICD-10-CM

## 2015-01-30 DIAGNOSIS — F1420 Cocaine dependence, uncomplicated: Secondary | ICD-10-CM | POA: Diagnosis present

## 2015-01-30 DIAGNOSIS — F314 Bipolar disorder, current episode depressed, severe, without psychotic features: Secondary | ICD-10-CM | POA: Diagnosis present

## 2015-01-30 DIAGNOSIS — F1410 Cocaine abuse, uncomplicated: Secondary | ICD-10-CM

## 2015-01-30 DIAGNOSIS — F419 Anxiety disorder, unspecified: Secondary | ICD-10-CM

## 2015-01-30 LAB — COMPREHENSIVE METABOLIC PANEL
ALBUMIN: 4.7 g/dL (ref 3.5–5.0)
ALT: 18 U/L (ref 17–63)
ANION GAP: 8 (ref 5–15)
AST: 23 U/L (ref 15–41)
Alkaline Phosphatase: 66 U/L (ref 38–126)
BUN: 18 mg/dL (ref 6–20)
CHLORIDE: 104 mmol/L (ref 101–111)
CO2: 25 mmol/L (ref 22–32)
Calcium: 9.7 mg/dL (ref 8.9–10.3)
Creatinine, Ser: 1.1 mg/dL (ref 0.61–1.24)
GFR calc Af Amer: >60 mL/min (ref >60)
GFR calc non Af Amer: >60 mL/min (ref >60)
GLUCOSE: 94 mg/dL (ref 65–99)
POTASSIUM: 4.5 mmol/L (ref 3.5–5.1)
SODIUM: 137 mmol/L (ref 135–145)
TOTAL PROTEIN: 8.4 g/dL — AB (ref 6.5–8.1)
Total Bilirubin: 0.7 mg/dL (ref 0.3–1.2)

## 2015-01-30 LAB — CBC WITH DIFFERENTIAL/PLATELET
BASOS PCT: 1 % (ref 0–1)
Basophils Absolute: 0 10*3/uL (ref 0.0–0.1)
Eosinophils Absolute: 0.6 10*3/uL (ref 0.0–0.7)
Eosinophils Relative: 10 % — ABNORMAL HIGH (ref 0–5)
HEMATOCRIT: 50.3 % (ref 39.0–52.0)
HEMOGLOBIN: 17 g/dL (ref 13.0–17.0)
LYMPHS ABS: 1.8 10*3/uL (ref 0.7–4.0)
Lymphocytes Relative: 28 % (ref 12–46)
MCH: 32.1 pg (ref 26.0–34.0)
MCHC: 33.8 g/dL (ref 30.0–36.0)
MCV: 94.9 fL (ref 78.0–100.0)
MONOS PCT: 6 % (ref 3–12)
Monocytes Absolute: 0.4 10*3/uL (ref 0.1–1.0)
NEUTROS ABS: 3.6 10*3/uL (ref 1.7–7.7)
NEUTROS PCT: 55 % (ref 43–77)
Platelets: 212 10*3/uL (ref 150–400)
RBC: 5.3 MIL/uL (ref 4.22–5.81)
RDW: 12.3 % (ref 11.5–15.5)
WBC: 6.4 10*3/uL (ref 4.0–10.5)

## 2015-01-30 LAB — ETHANOL: Alcohol, Ethyl (B): <5 mg/dL (ref <5)

## 2015-01-30 LAB — RAPID URINE DRUG SCREEN, HOSP PERFORMED
Amphetamines: NOT DETECTED
BARBITURATES: NOT DETECTED
Benzodiazepines: POSITIVE — AB
COCAINE: POSITIVE — AB
Opiates: NOT DETECTED
TETRAHYDROCANNABINOL: NOT DETECTED

## 2015-01-30 MED ORDER — VALACYCLOVIR HCL 500 MG PO TABS
500.0000 mg | ORAL_TABLET | Freq: Two times a day (BID) | ORAL | Status: DC
  Administered 2015-01-30 – 2015-01-31 (×3): 500 mg via ORAL
  Filled 2015-01-30 (×7): qty 1

## 2015-01-30 MED ORDER — HYDROXYZINE HCL 25 MG PO TABS
25.0000 mg | ORAL_TABLET | Freq: Three times a day (TID) | ORAL | Status: DC | PRN
  Administered 2015-01-31: 25 mg via ORAL
  Filled 2015-01-30: qty 42
  Filled 2015-01-30: qty 1

## 2015-01-30 MED ORDER — LAMOTRIGINE 25 MG PO TABS
25.0000 mg | ORAL_TABLET | Freq: Two times a day (BID) | ORAL | Status: DC
  Administered 2015-01-30 – 2015-01-31 (×3): 25 mg via ORAL
  Filled 2015-01-30 (×7): qty 1

## 2015-01-30 MED ORDER — ZOLPIDEM TARTRATE 10 MG PO TABS
10.0000 mg | ORAL_TABLET | Freq: Every day | ORAL | Status: DC
  Administered 2015-01-30 – 2015-01-31 (×2): 10 mg via ORAL
  Filled 2015-01-30 (×2): qty 1

## 2015-01-30 MED ORDER — LORAZEPAM 1 MG PO TABS
1.0000 mg | ORAL_TABLET | Freq: Two times a day (BID) | ORAL | Status: DC | PRN
  Administered 2015-01-31 – 2015-02-01 (×3): 1 mg via ORAL
  Filled 2015-01-30 (×3): qty 1

## 2015-01-30 MED ORDER — IBUPROFEN 200 MG PO TABS
400.0000 mg | ORAL_TABLET | Freq: Four times a day (QID) | ORAL | Status: DC | PRN
  Administered 2015-01-31: 400 mg via ORAL
  Filled 2015-01-30: qty 2

## 2015-01-30 NOTE — ED Notes (Signed)
Pt returned to desk states wants to ensure [facility] is aware he does not want to be d/c'd until tomorrow. Advised pt that will be a discussion he may have w/[facility] provider. Voiced understanding then returned to his room.

## 2015-01-30 NOTE — ED Notes (Signed)
Snack and drink given at 3pm. 

## 2015-01-30 NOTE — Consult Note (Signed)
[facility] Face-to-Face Psychiatry Consult   Reason for Consult:  cocaine abuse and depressed mood Referring Physician:  ERP Patient Identification: BLAIDEN WERTH MRN:  [id number] Principal Diagnosis: Substance induced mood disorder Diagnosis:   Patient Active Problem List   Diagnosis Date Noted  . Substance induced mood disorder [F19.94] 12/08/2013  . Depression with suicidal ideation [F32.9, R45.851] 06/07/2013  . Cocaine abuse [F14.10] 06/29/2012  . Alcohol abuse [F10.10] 06/29/2012  . Bipolar disorder, current episode manic severe with psychotic features [F31.2] 04/22/2012    Class: Acute    Total Time spent with patient: 1 hour  Subjective:   CORBIN FALCK is a 54 y.o. male patient admitted with substance intoxication and mood disorder.  HPI:  KYSHAWN TEAL is an 54 y.o. male seen for psychiatric consultation follow-up face-to-face. Patient reportedly suffering with major depressive disorder, polysubstance abuse and recently went to his dad's funeral [place]. While traveling he lost his prescription and no prescription about 1 week which made him more depressed and has suicidal thoughts. Patient brother brought him to the hospital to prevent suicidal attempt. Since patient has been admitted to the [facility] emergency department all his medication was resumed and he started feeling at baseline without symptoms of depression, anxiety or substance withdrawal symptoms. Patient stated that he need 1 more day before discharge to his home. Patient denies current symptoms of auditory or visual hallucinations, delusions, paranoia. Patient has mild depression secondary to loss of his father. Patient contract for safety at this time. He expressed guilt about his family being scared of him and him not being able to come around them anymore. Patient has a history of polysubstance abuse but free from drug of abuse for the last 6 months and his recent blood alcohol level is 91 on admission and  positive for cocaine about a week ago on arrival. Patient to is willing to follow up with outpatient medication management and contract for safety during this visit.  HPI Elements:   Location:  depression. Quality:  fair. Severity:  Mild to moderate. Timing:  Ran out of medication. Duration:  One week ago. Context:  Psychosocial stressors and grief reaction.  Past Medical History:  Past Medical History  Diagnosis Date  . Depression   . Arthritis   . Bipolar 1 disorder   . PTSD (post-traumatic stress disorder)   . Schizophrenia   . Balanitis    History reviewed. No pertinent past surgical history. Family History: History reviewed. No pertinent family history. Social History:  History  Alcohol Use  . 0.0 oz/week    Comment: 2 six packs/day     History  Drug Use  . Yes  . Special: Marijuana, Cocaine    Social History   Social History  . Marital Status: Single    Spouse Name: N/A  . Number of Children: N/A  . Years of Education: N/A   Social History Main Topics  . Smoking status: Former Smoker -- 0.00 packs/day  . Smokeless tobacco: Never Used  . Alcohol Use: 0.0 oz/week     Comment: 2 six packs/day  . Drug Use: Yes    Special: Marijuana, Cocaine  . Sexual Activity: Not Currently    Birth Control/ Protection: Condom   Other Topics Concern  . None   Social History Narrative   Additional Social History:    Pain Medications: see MAR Prescriptions: see MAR Over the Counter: see MAR History of alcohol / drug use?: Yes Longest period of sobriety (when/how long):  6 months Negative Consequences of Use: Personal relationships Name of Substance 1: ETOH 1 - Age of First Use: unknown 1 - Amount (size/oz): unknown 1 - Frequency: goes on binges for 3 days until sick 1 - Duration: years 1 - Last Use / Amount: today Name of Substance 2: THC 2 - Age of First Use: unknown 2 - Amount (size/oz): unknown (has blackouts and doesn't remember) 2 - Frequency: unknown (has  blackouts and doesn't remember) 2 - Duration: unknown (has blackouts and doesn't remember) 2 - Last Use / Amount: about 6 months Name of Substance 3: Cocaine 3 - Age of First Use: unknown 3 - Amount (size/oz): unknown (has blackouts and doesn't remember) 3 - Frequency: unknown (has blackouts and doesn't remember) 3 - Duration: unknown (has blackouts and doesn't remember) 3 - Last Use / Amount: about 6 months               Allergies:   Allergies  Allergen Reactions  . Lithium Other (See Comments)    caused muscle twitching episodes  . Hydrocodone-Acetaminophen Rash    Labs: No results found for this or any previous visit (from the past 48 hour(s)).  Vitals: Blood pressure 102/64, pulse 71, temperature 98.4 F (36.9 C), temperature source Oral, resp. rate 16, SpO2 97 %.  Risk to Self: Suicidal Ideation: Yes-Currently Present Suicidal Intent: Yes-Currently Present Is patient at risk for suicide?: Yes Suicidal Plan?: Yes-Currently Present Specify Current Suicidal Plan: shoot himself Access to Means: No Specify Access to Suicidal Means: attempted to get brother's gun What has been your use of drugs/alcohol within the last 12 months?: ETOH, THC, cocaine hx How many times?: 2 Other Self Harm Risks: 0 Triggers for Past Attempts: Family contact, Unpredictable Intentional Self Injurious Behavior: Cutting Comment - Self Injurious Behavior: cutting Risk to Others: Homicidal Ideation: No Thoughts of Harm to Others: No Current Homicidal Intent: No Current Homicidal Plan: No Access to Homicidal Means: No History of harm to others?: No Assessment of Violence: None Noted Does patient have access to weapons?: No Criminal Charges Pending?: No Does patient have a court date: No Prior Inpatient Therapy: Prior Inpatient Therapy: Yes Prior Therapy Dates: multiple over several years Prior Therapy Facilty/Provider(s): [facility]; [facility]; others Reason for Treatment: Bipolar; PTSD; substance  abuse Prior Outpatient Therapy: Prior Outpatient Therapy: No Does patient have an ACCT team?: No Does patient have Intensive In-House Services?  : No Does patient have [facility] services? : No Does patient have P4CC services?: No  Current Facility-Administered Medications  Medication Dose Route Frequency Provider Last Rate Last Dose  . acetaminophen (TYLENOL) tablet 650 mg  650 mg Oral Q6H PRN Angelina Ok, MD   650 mg at 01/29/15 1449  . hydrOXYzine (ATARAX/VISTARIL) tablet 25 mg  25 mg Oral TID PRN Angelina Ok, MD   25 mg at 01/30/15 1000  . ibuprofen (ADVIL,MOTRIN) tablet 400 mg  400 mg Oral Q6H PRN Angelina Ok, MD   400 mg at 01/30/15 1000  . lamoTRIgine (LAMICTAL) tablet 25 mg  25 mg Oral BID Angelina Ok, MD   25 mg at 01/30/15 1610  . LORazepam (ATIVAN) tablet 1 mg  1 mg Oral BID PRN Angelina Ok, MD   1 mg at 01/30/15 9604  . valACYclovir (VALTREX) tablet 500 mg  500 mg Oral BID Angelina Ok, MD   500 mg at 01/30/15 0952  . zolpidem (AMBIEN) tablet 10 mg  10 mg Oral QHS Angelina Ok, MD   10 mg at 01/29/15 2134  Current Outpatient Prescriptions  Medication Sig Dispense Refill  . hydrOXYzine (VISTARIL) 25 MG capsule Take 25 mg by mouth 3 (three) times daily as needed for anxiety.    Marland Kitchen ibuprofen (ADVIL,MOTRIN) 200 MG tablet Take 400 mg by mouth every 6 (six) hours as needed for moderate pain.    Marland Kitchen lamoTRIgine (LAMICTAL) 25 MG tablet Take 1 tablet (25 mg total) by mouth 2 (two) times daily. 60 tablet 0  . LORazepam (ATIVAN) 1 MG tablet Take 1 mg by mouth 2 (two) times daily as needed for anxiety.    . valACYclovir (VALTREX) 500 MG tablet Take 500 mg by mouth 2 (two) times daily.    Marland Kitchen zolpidem (AMBIEN) 10 MG tablet Take 10 mg by mouth at bedtime.    . cephALEXin (KEFLEX) 500 MG capsule Take 1 capsule (500 mg total) by mouth 3 (three) times daily. 21 capsule 0    Musculoskeletal: Strength & Muscle Tone: within normal limits Gait & Station: normal Patient leans:  N/A  Psychiatric Specialty Exam: Physical Exam Full physical performed in Emergency Department. I have reviewed this assessment and concur with its findings.   ROS patient denied nausea or vomiting stomach pain, chest pain, shortness of breath. Patient has no afferent paranoia or hallucinations. No Fever-chills, No Headache, No changes with Vision or hearing, reports vertigo No problems swallowing food or Liquids, No Chest pain, Cough or Shortness of Breath, No Abdominal pain, No Nausea or Vommitting, Bowel movements are regular, No Blood in stool or Urine, No dysuria, No new skin rashes or bruises, No new joints pains-aches,  No new weakness, tingling, numbness in any extremity, No recent weight gain or loss, No polyuria, polydypsia or polyphagia,   A full 10 point Review of Systems was done, except as stated above, all other Review of Systems were negative.  Blood pressure 102/64, pulse 71, temperature 98.4 F (36.9 C), temperature source Oral, resp. rate 16, SpO2 97 %.There is no weight on file to calculate BMI.  General Appearance: Casual  Eye Contact::  Good  Speech:  Clear and Coherent  Volume:  Normal  Mood:  Euthymic  Affect:  Appropriate and Congruent  Thought Process:  Coherent and Goal Directed  Orientation:  Full (Time, Place, and Person)  Thought Content:  WDL  Suicidal Thoughts:  No  Homicidal Thoughts:  No  Memory:  Immediate;   Fair Recent;   Fair Remote;   Fair  Judgement:  Fair  Insight:  Fair  Psychomotor Activity:  Normal  Concentration:  Good  Recall:  Good  Fund of Knowledge:Good  Language: Good  Akathisia:  Negative  Handed:  Right  AIMS (if indicated):     Assets:  Communication Skills Desire for Improvement Financial Resources/Insurance Housing Leisure Time Physical Health Resilience Social Support Transportation  ADL's:  Intact  Cognition: WNL  Sleep:      Medical Decision Making: Review of Psycho-Social Stressors (1), Review or  order clinical lab tests (1), Established Problem, Worsening (2), Review of Last Therapy Session (1), Review or order medicine tests (1), Review of Medication Regimen & Side Effects (2) and Review of New Medication or Change in Dosage (2)  Treatment Plan Summary: Patient has no safety concerns discontinue safety sitter Continue his medications lamotrigine 25 mg twice daily for mood and Ambien 10 mg daily for insomnia Patient does not have alcohol withdrawal symptoms and may refer to rehabilitation treatment if interested Patient benefit from the outpatient grief counseling. Patient does not meet criteria for psychiatric  inpatient admission. Supportive therapy provided about ongoing stressors.  Appreciate psychiatric consultation Please contact [phone] or [phone] if needs further assistance    Disposition: Patient will be referred to the outpatient medication management and grief counseling at [facility] when medically stable.  Jaziel Bennett,[name] 01/30/2015 11:37 AM

## 2015-01-30 NOTE — ED Notes (Signed)
Pt ambulatory to nurses' desk asking if he may have Ativan for anxiety. Advised pt RN will bring it to him.

## 2015-01-30 NOTE — ED Notes (Signed)
Pt states that he was d/c from [facility] and told to get his medications at [facility]. States that he was told to go to [facility] for his medications but he was unable to get them. States he is seeing and hearing things and wants medications to get him through the night or to stay the night until he can get his medications. Alert and oriented. Denies SI/HI.

## 2015-01-30 NOTE — ED Notes (Signed)
Bed: [facility] Expected date:  Expected time:  Means of arrival:  Comments: Triage 3 

## 2015-01-30 NOTE — ED Provider Notes (Signed)
CSN: [id number]     Arrival date & time 01/30/15  1917 History   First MD Initiated Contact with Patient 01/30/15 2016     Chief Complaint  Patient presents with  . Hallucinations     (Consider location/radiation/quality/duration/timing/severity/associated sxs/prior Treatment) Patient is a 54 y.o. male presenting with mental health disorder. The history is provided by the patient. No language interpreter was used.  Mental Health Problem Presenting symptoms: suicidal thoughts   Degree of incapacity (severity):  Moderate Onset quality:  Gradual Timing:  Constant Progression:  Worsening Chronicity:  Recurrent Relieved by:  Nothing Worsened by:  Nothing tried Ineffective treatments:  Antipsychotics and anti-anxiety medications Associated symptoms: irritability   Risk factors: recent psychiatric admission   Risk factors: no family hx of mental illness   Pt discharged from [facility] ED today after psych hold for 3 days.  Pt reports he told everyone he could not go home. Pt reports his family picked him up and told him he was not okay and that he could not stay with him.  Family carried [facility] to [facility] and [facility].  Pt brought here and dropped off by family.  Pt reports he needs his medication.  Pt reports he feels like cutting himself.   Past Medical History  Diagnosis Date  . Depression   . Arthritis   . Bipolar 1 disorder   . PTSD (post-traumatic stress disorder)   . Schizophrenia   . Balanitis    History reviewed. No pertinent past surgical history. History reviewed. No pertinent family history. Social History  Substance Use Topics  . Smoking status: Former Smoker -- 0.00 packs/day  . Smokeless tobacco: Never Used  . Alcohol Use: 0.0 oz/week     Comment: 2 six packs/day    Review of Systems  Constitutional: Positive for irritability.  Psychiatric/Behavioral: Positive for suicidal ideas.  All other systems reviewed and are negative.     Allergies   Lithium and Hydrocodone-acetaminophen  Home Medications   Prior to Admission medications   Medication Sig Start Date End Date Taking? Authorizing Provider  hydrOXYzine (VISTARIL) 25 MG capsule Take 25 mg by mouth 3 (three) times daily as needed for anxiety.   Yes Historical Provider, MD  ibuprofen (ADVIL,MOTRIN) 200 MG tablet Take 400 mg by mouth every 6 (six) hours as needed for moderate pain.   Yes Historical Provider, MD  lamoTRIgine (LAMICTAL) 25 MG tablet Take 1 tablet (25 mg total) by mouth 2 (two) times daily. 02/22/14  Yes Beau Fanny, FNP  LORazepam (ATIVAN) 1 MG tablet Take 1 mg by mouth 2 (two) times daily as needed for anxiety.   Yes Historical Provider, MD  valACYclovir (VALTREX) 500 MG tablet Take 500 mg by mouth 2 (two) times daily.   Yes Historical Provider, MD  zolpidem (AMBIEN) 10 MG tablet Take 10 mg by mouth at bedtime.   Yes Historical Provider, MD   Pulse 95  Temp(Src) 97.7 F (36.5 C) (Oral)  Resp 22  SpO2 98% Physical Exam  Constitutional: He is oriented to person, place, and time. He appears well-developed and well-nourished.  HENT:  Head: Normocephalic.  Right Ear: External ear normal.  Left Ear: External ear normal.  Nose: Nose normal.  Eyes: Conjunctivae and EOM are normal.  Neck: Normal range of motion.  Cardiovascular: Normal rate.   Pulmonary/Chest: Effort normal and breath sounds normal.  Abdominal: Soft. He exhibits no distension.  Musculoskeletal: Normal range of motion.  Neurological: He is alert and oriented to  person, place, and time.  Skin: Skin is warm.  Psychiatric: He has a normal mood and affect.  Nursing note and vitals reviewed.   ED Course  Procedures (including critical care time) Labs Review Labs Reviewed  COMPREHENSIVE METABOLIC PANEL - Abnormal; Notable for the following:    Total Protein 8.4 (*)    All other components within normal limits  CBC WITH DIFFERENTIAL/PLATELET - Abnormal; Notable for the following:     Eosinophils Relative 10 (*)    All other components within normal limits  URINE RAPID DRUG SCREEN, HOSP PERFORMED - Abnormal; Notable for the following:    Cocaine POSITIVE (*)    Benzodiazepines POSITIVE (*)    All other components within normal limits  ETHANOL    Imaging Review No results found. I have personally reviewed and evaluated these images and lab results as part of my medical decision-making.   EKG Interpretation None      MDM  TTS evaluated pt.  They will have Psychiatrist see pt in am.  (I am unsure if pt has had a change since discharge)    Final diagnoses:  Depression  Anxiety        Elson Areas, PA-C 01/30/15 2316  Benjiman Core, MD 01/30/15 [phone]

## 2015-01-30 NOTE — ED Notes (Signed)
Pt on phone at nurses' desk. 

## 2015-01-30 NOTE — BH Assessment (Signed)
Spoke with PA prior to initiating assessment. Pt was discharged from [facility] earlier today and presenting to [facility] as a walk in. He was told he had been seen and discharged earlier in the day and was referred to follow up with OP resources.   Per Langston Masker PA, pt's family reports they picked him up from [facility] ED and found pt to be actively psychotic and they did not feel safe with him staying in the home. Pt continues to endorse AVH.   Assessment to commence shortly.    Clista Bernhardt, [facility] Triage Specialist 01/30/2015 9:06 PM

## 2015-01-30 NOTE — ED Notes (Addendum)
Ativan given tp pt as requested d/t pt states he is experiencing an anxiety attack. Pt appears calm, cooperative. Breakfast tray on bedside table.

## 2015-01-30 NOTE — ED Notes (Signed)
W. Aundria Rud, CM, sending info re: pt to [place].
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
## 2015-01-30 NOTE — ED Notes (Addendum)
Pt at nurses' desk asking if he will be given written rx's when he is d/c'd in am. States was advised by psychiatrist that he will need to get rx's at [facility]. States he does not want to do this. Advised pt will discuss further when notes have been entered. Voiced understanding. Pt returned to room.

## 2015-01-30 NOTE — ED Notes (Signed)
Blood draw delayed, pt currently with TTS

## 2015-01-30 NOTE — ED Notes (Addendum)
Pt upset w/d/c - wanting to stay until 6am in the morning. Advised pt was seen yesterday by [name], NP, and was recommended to be d/c'd this am - unable to stay another night per [place], Consulting civil engineer. Security standing by while RN d/c'ing pt. When discharging pt, states he is going to go to [facility]. Advised pt he has been assessed by Dr Julien Girt, psychiatrist, who recommends he follow up w/[facility]. Pt continuing to request 30-day rx's - states going to [facility] tomorrow. Advised pt, per Dr Gwendolyn Grant, no rx's to be given and that he should follow up w/[facility].

## 2015-01-30 NOTE — ED Notes (Signed)
NT warming pt's breakfast as requested.

## 2015-01-30 NOTE — ED Notes (Signed)
Dr Jonnalangada in w/pt. 

## 2015-01-30 NOTE — Discharge Instructions (Signed)

## 2015-01-30 NOTE — ED Notes (Signed)
PT AMBULATORY TO NURSES' DESK - REQUESTING FOR RN TO ADVISE [facility] HE IS FEELING SOME BETTER AND FEELS HE MAY BE OK TO BE D/C'D TO HOME TOMORROW. STATES HE WILL BE GOING TO HIS BROTHER'S HOUSE SO HE WILL NEED A COUPLE OF BUS TICKETS. ADVISED PT [facility] IS ATTEMPTING TO GET PROVIDER TO ASSESS HIM TODAY. VOICED UNDERSTANDING AND RETURNED TO ROOM.

## 2015-01-30 NOTE — ED Notes (Signed)
Meal Tray at bedside 

## 2015-01-31 MED ORDER — TRAZODONE HCL 50 MG PO TABS
50.0000 mg | ORAL_TABLET | Freq: Every day | ORAL | Status: DC
  Filled 2015-01-31: qty 14

## 2015-01-31 MED ORDER — LAMOTRIGINE 25 MG PO TABS: 25.0000 mg | ORAL_TABLET | Freq: Two times a day (BID) | ORAL | Status: DC

## 2015-01-31 MED ORDER — VALACYCLOVIR HCL 500 MG PO TABS: 500.0000 mg | ORAL_TABLET | Freq: Two times a day (BID) | ORAL | Status: DC

## 2015-01-31 MED ORDER — HYDROXYZINE HCL 25 MG PO TABS: 25.0000 mg | ORAL_TABLET | Freq: Three times a day (TID) | ORAL | Status: DC | PRN

## 2015-01-31 MED ORDER — TRAZODONE HCL 50 MG PO TABS: 50.0000 mg | ORAL_TABLET | Freq: Every day | ORAL | Status: DC

## 2015-01-31 NOTE — Progress Notes (Signed)
  pcp is [facility]  [address] [place] [address] [phone]   Information entered in [facility] for pt Schedule an appointment as soon as possible for a visit on 01/31/2015 This is your assigned medicaid Robbie Lis access doctor If you prefer another contact DSS [phone] after using the list of [place] [employer] given to you by the ED Case manager to assist with your choice of doctor   Pt encouraged to Please contact the Dept of Social services case worker to have a local medicaid accepting primary care doctor entered pror to going to one from list

## 2015-01-31 NOTE — Progress Notes (Signed)
Pt A&O X 4. Cooperative with transfer from TCU to [facility]. Presents with flat affect and depressed mood on initial contact. Denies SI, HI, AVH and pain when assessed. Possible d/[facility] to [facility] in AM. Pt ambulates with a steady gait. Support, availability and encouragement offered. Safety maintained on Q 15 minutes checks as ordered.

## 2015-01-31 NOTE — Progress Notes (Signed)
Pt is psychiatrically stbale for discharge. Pt was suppose to report to [facility] today by 915 am, with 30 day supply of medication. However pt was unable to obtain his 30 day of medication because [facility] was closed per chart review. Pt is requested prescriptions for medications to remain in the ED until his bed is open tomorrow. Pt states he feels hopeless if he has to return to the environment of the streets because he is afraid he will drink or use drugs. Pt states, "I feel like going and laying down in the street or cutting myself because ya'll keep shipping me around. I found [facility] on my own, and I don't understand why you wont let me stay one more night until I can go straight to [facility]." Pt shares that his brother and ister whom he's been living with and wont let him return until he goes to treatment.   CSW updated psychiatrist and NP.   Pt states that he will not be SI if he can stay one more night, get his prescriptions to go to his ASSESSMENT appointment at [facility]. Pt understands that it does not guaruntee him a bed (most likely pt will have one but not guaruntee per [facility] as they are familiar with patient) and that returning to hospital to wait for [facility] is NOT an option.    CSW to assist with pt dc tomorrow morning by 8am.   Olga Coaster, LCSW  Clinical Social Work  [facility] [phone]

## 2015-01-31 NOTE — ED Notes (Signed)
Bed: ZO10 Expected date:  Expected time:  Means of arrival:  Comments: Hold Hallway C

## 2015-01-31 NOTE — ED Notes (Signed)
Pt is argumentative with the staff in regards to his disposition. Pt wants to be discharged and given prescription meds from the EDP. EDP reports that they have made pt aware that he has to see psych so that they can discharge him with his psych medications. Pt keeps calling [facility] and wants RN to speak to staff there and I have informed pt that I can not request for him a bed placement and also that I do not have the authority to discharge him. Informed pt that as soon as the TTS counselor, Child psychotherapist, MD, NP, etc come in that they would arrange for his medications, transportation, and disposition when they deem that he is stable and ready for transport.

## 2015-01-31 NOTE — B[facility] Counselor (Signed)
Faxed referral packet at 3:26 am on 01/31/15 to [place], [facility], [place], [place], and [employer]. Tiburcio Pea, MS  Counselor 01/31/2015 3:31 AM

## 2015-01-31 NOTE — BH Assessment (Signed)
Received call from [facility], she reports pt told her he needed to leave because he had a bed at [facility] but needed to arrive by 0830 this morning. Per Loyola Mast, EDP requesting reassessment to determine if pt can leave.   Clista Bernhardt, [facility] Triage Specialist 01/31/2015 6:10 AM

## 2015-01-31 NOTE — ED Notes (Addendum)
Pt awoke this morning feeling agitated and wanting to leave. TTS was called to pt bedside. Plan of care is for TTS to confirm bed placement at [facility] and to have [facility] transport pt around 0800. Medication given to help calm pt; see MAR. Pt agreeing to the aforementioned plan at this time.

## 2015-01-31 NOTE — ED Notes (Signed)
Pt pacing back and forth and coming to the nursing station stating that he needs to call [facility] so that he can get his bed and that he is going to need his medications. Informed pt of the rules and that I would speak with TTS and the social worker to find out about his disposition.

## 2015-01-31 NOTE — Progress Notes (Signed)
Per psychiatrist and NP, patient psychiatrically stable for discharge. Pt had planned to be at [facility] today by 915 am, however pt was unable to discharge in time. Pt does medically and psychiatrically stable. CSW and TTS discussed outpatient resources and homeless resources including going to the [facility] to find shelter for tonight before following up with [facility] tomorrow. Patient provided with resources and two bus passes.   Olga Coaster, LCSW  Clinical Social Work  [employer] [phone]

## 2015-01-31 NOTE — ED Provider Notes (Signed)
Patient is awake and alert. He is wanting to leave the emergency department so that he can go to [facility] for alcohol abuse. States he has a bed at 8:30 am. He is denying any command hallucinations at this point. He states he is not homicidal or suicidal. Will contract for safety.   After speaking with [facility] counselor patient has agreed to wait for psychiatrist interview.  Loren Racer, MD 02/01/15 [phone]

## 2015-01-31 NOTE — ED Notes (Signed)
Bed: [id number] Expected date:  Expected time:  Means of arrival:  Comments: Hold for 31 

## 2015-01-31 NOTE — BH Assessment (Addendum)
Met with pt per Rn request. Pt reports he spoke with his brother last night and told he has a bed at [facility] but needs to arrive at 0830 with a 30 day supply of his medications. He reports he was trying to arrange this yesterday, and his brother got a call back after pt arrival to ED. Pt did not mention a bed at [facility] at time of assessment last night. Pt had been informed inpt was recommended and TTS was seeking placement. Pt was told he would likely be seen by psychiatry later in morning if he had not been placed. Pt was asking for 30 supply of medications and saying his brother was going to pick him up on [address]. Informed pt TTS has been seeking placement throughout the night, and would arrange transport via [facility] once he was accepted somewhere. Agreed to try to contact [facility] to see if pt does have a bed.   Spoke with [facility] staff who reports third shift does not have access to appointment book, or waitlist and can not say if this pt has a bed. She requests a call back after 8 am to speak with [facility] at [phone]. She reports if pt has a bed he will not lose it prior to [facility] ED discharge.   Spoke with pt about the request from [facility] to call back at [phone] to confirm bed status. Assured pt if bed is accepted that he could be transported there via [facility]. Pt repeatedly requested a 30 day supply of his medications. Informed pt it would be up to the doctor how much, if any medication would be sent, and only if it was specifically requested from [facility] at time bed was offered. Informed Clinical biochemist of status.    Lear Ng, [facility] Triage Specialist 01/31/2015 6:21 AM

## 2015-02-01 DIAGNOSIS — F1994 Other psychoactive substance use, unspecified with psychoactive substance-induced mood disorder: Secondary | ICD-10-CM

## 2015-02-01 DIAGNOSIS — F1410 Cocaine abuse, uncomplicated: Secondary | ICD-10-CM

## 2015-02-01 DIAGNOSIS — F419 Anxiety disorder, unspecified: Secondary | ICD-10-CM | POA: Insufficient documentation

## 2015-02-01 NOTE — Consult Note (Signed)
[facility] Psychiatry Consult   Reason for Consult:  Cocaine abuse, Substance induced mood disorder Referring Physician:  EDP Patient Identification: [name] MRN:  [id number] Principal Diagnosis: Substance induced mood disorder Diagnosis:   Patient Active Problem List   Diagnosis Date Noted  . Substance induced mood disorder [F19.94] 12/08/2013  . Depression with suicidal ideation [F32.9, R45.851] 06/07/2013  . Cocaine abuse [F14.10] 06/29/2012  . Alcohol abuse [F10.10] 06/29/2012  . Bipolar disorder, current episode manic severe with psychotic features [F31.2] 04/22/2012    Class: Acute    Total Time spent with patient: 45 minutes  Subjective:   [name] is a 54 y.o. male patient admitted with .Cocaine abuse, Substance induced mood disorder  HPI:  AA male was evaluated who was discharged at our [facility] but ended up coming to [facility] because he had no medications to take at home.  He also stated that he wanted to stay and move in to [facility] for Long term substance abuse care.  Patient is known to the service and does not take his medications and does not follow up with his assigned outpatient providers.  Patient stated that he has been talking with [facility] for rehabilitation treatment and has an interview with them today.  Patient denied SI/HI/AVH.  Patient is discharged to go directly to [facility] with 30 days supply of his medications.  HPI Elements:   Location:  Substance induced mood disorder, Cocaine use disorder, severe, Bipolar disorder by hx. Quality:  Moderate-severe. Severity:  Moderate-severe. Timing:  Acute. Context:  Seeking long term substance abuse treatment..  Past Medical History:  Past Medical History  Diagnosis Date  . Depression   . Arthritis   . Bipolar 1 disorder   . PTSD (post-traumatic stress disorder)   . Schizophrenia   . Balanitis    History reviewed. No pertinent past surgical history. Family History: History  reviewed. No pertinent family history. Social History:  History  Alcohol Use  . 0.0 oz/week    Comment: 2 six packs/day     History  Drug Use  . Yes  . Special: Marijuana, Cocaine    Social History   Social History  . Marital Status: Single    Spouse Name: N/A  . Number of Children: N/A  . Years of Education: N/A   Social History Main Topics  . Smoking status: Former Smoker -- 0.00 packs/day  . Smokeless tobacco: Never Used  . Alcohol Use: 0.0 oz/week     Comment: 2 six packs/day  . Drug Use: Yes    Special: Marijuana, Cocaine  . Sexual Activity: Not Currently    Birth Control/ Protection: Condom   Other Topics Concern  . None   Social History Narrative   Additional Social History:    Pain Medications: See PTA Prescriptions: See PTA Over the Counter: See PTA History of alcohol / drug use?: Yes Longest period of sobriety (when/how long): 6 months Negative Consequences of Use: Personal relationships Withdrawal Symptoms:  (none reported at this time) Name of Substance 1: ETOH 1 - Age of First Use: unknown 1 - Amount (size/oz): unknown 1 - Frequency: goes on binges for 3 days until sick 1 - Duration: years 1 - Last Use / Amount: 4 days ago  Name of Substance 2: THC 2 - Age of First Use: unknown 2 - Amount (size/oz): unknown (has blackouts and doesn't remember) 2 - Frequency: unknown (has blackouts and doesn't remember) - reports he tries to  self medicate 2 - Duration: unknown (has blackouts and doesn't remember) 2 - Last Use / Amount: reports used about a week ago  Name of Substance 3: Cocaine 3 - Age of First Use: unknown 3 - Amount (size/oz): unknown (has blackouts and doesn't remember) 3 - Frequency: unknown (has blackouts and doesn't remember) 3 - Duration: unknown (has blackouts and doesn't remember) 3 - Last Use / Amount: reports used about a week ago                Allergies:   Allergies  Allergen Reactions  . Lithium Other (See Comments)     caused muscle twitching episodes  . Hydrocodone-Acetaminophen Rash    Labs:  Results for orders placed or performed during the hospital encounter of 01/30/15 (from the past 48 hour(s))  Comprehensive metabolic panel     Status: Abnormal   Collection Time: 01/30/15  9:31 PM  Result Value Ref Range   Sodium 137 135 - 145 mmol/L   Potassium 4.5 3.5 - 5.1 mmol/L   Chloride 104 101 - 111 mmol/L   CO2 25 22 - 32 mmol/L   Glucose, Bld 94 65 - 99 mg/dL   BUN 18 6 - 20 mg/dL   Creatinine, Ser 1.10 0.61 - 1.24 mg/dL   Calcium 9.7 8.9 - 10.3 mg/dL   Total Protein 8.4 (H) 6.5 - 8.1 g/dL   Albumin 4.7 3.5 - 5.0 g/dL   AST 23 15 - 41 U/L   ALT 18 17 - 63 U/L   Alkaline Phosphatase 66 38 - 126 U/L   Total Bilirubin 0.7 0.3 - 1.2 mg/dL   GFR calc non Af Amer >60 >60 mL/min   GFR calc Af Amer >60 >60 mL/min    Comment: (NOTE) The eGFR has been calculated using the CKD EPI equation. This calculation has not been validated in all clinical situations. eGFR's persistently <60 mL/min signify possible Chronic Kidney Disease.    Anion gap 8 5 - 15  Ethanol     Status: None   Collection Time: 01/30/15  9:31 PM  Result Value Ref Range   Alcohol, Ethyl (B) <5 <5 mg/dL    Comment:        LOWEST DETECTABLE LIMIT FOR SERUM ALCOHOL IS 5 mg/dL FOR MEDICAL PURPOSES ONLY   CBC with Diff     Status: Abnormal   Collection Time: 01/30/15  9:31 PM  Result Value Ref Range   WBC 6.4 4.0 - 10.5 K/uL   RBC 5.30 4.22 - 5.81 MIL/uL   Hemoglobin 17.0 13.0 - 17.0 g/dL   HCT 50.3 39.0 - 52.0 %   MCV 94.9 78.0 - 100.0 fL   MCH 32.1 26.0 - 34.0 pg   MCHC 33.8 30.0 - 36.0 g/dL   RDW 12.3 11.5 - 15.5 %   Platelets 212 150 - 400 K/uL   Neutrophils Relative % 55 43 - 77 %   Neutro Abs 3.6 1.7 - 7.7 K/uL   Lymphocytes Relative 28 12 - 46 %   Lymphs Abs 1.8 0.7 - 4.0 K/uL   Monocytes Relative 6 3 - 12 %   Monocytes Absolute 0.4 0.1 - 1.0 K/uL   Eosinophils Relative 10 (H) 0 - 5 %   Eosinophils Absolute 0.6 0.0  - 0.7 K/uL   Basophils Relative 1 0 - 1 %   Basophils Absolute 0.0 0.0 - 0.1 K/uL  Urine rapid drug screen (hosp performed)not at [facility]     Status: Abnormal  Collection Time: 01/30/15  9:49 PM  Result Value Ref Range   Opiates NONE DETECTED NONE DETECTED   Cocaine POSITIVE (A) NONE DETECTED   Benzodiazepines POSITIVE (A) NONE DETECTED   Amphetamines NONE DETECTED NONE DETECTED   Tetrahydrocannabinol NONE DETECTED NONE DETECTED   Barbiturates NONE DETECTED NONE DETECTED    Comment:        DRUG SCREEN FOR MEDICAL PURPOSES ONLY.  IF CONFIRMATION IS NEEDED FOR ANY PURPOSE, NOTIFY LAB WITHIN 5 DAYS.        LOWEST DETECTABLE LIMITS FOR URINE DRUG SCREEN Drug Class       Cutoff (ng/mL) Amphetamine      1000 Barbiturate      200 Benzodiazepine   786 Tricyclics       767 Opiates          300 Cocaine          300 THC              50     Vitals: Blood pressure 106/68, pulse 76, temperature 98 F (36.7 C), temperature source Oral, resp. rate 18, SpO2 100 %.  Risk to Self: Suicidal Ideation: Yes-Currently Present Suicidal Intent: No-Not Currently/Within Last 6 Months Is patient at risk for suicide?: Yes Suicidal Plan?: Yes-Currently Present Specify Current Suicidal Plan: to overdose or cut himself  Access to Means: Yes Specify Access to Suicidal Means: knife or medications What has been your use of drugs/alcohol within the last 12 months?: etoh, THC, and cocaine  How many times?: 3 Other Self Harm Risks: SA Triggers for Past Attempts: Family contact, Unpredictable, Hallucinations Intentional Self Injurious Behavior: Cutting Comment - Self Injurious Behavior: reports hx of cutting last time about a month ago  Risk to Others: Homicidal Ideation: No Thoughts of Harm to Others: No Current Homicidal Intent: No Current Homicidal Plan: No Access to Homicidal Means: No Identified Victim: none History of harm to others?: No Assessment of Violence: None Noted Violent Behavior  Description: none reported Does patient have access to weapons?: No Criminal Charges Pending?: No Does patient have a court date: No Prior Inpatient Therapy: Prior Inpatient Therapy: Yes Prior Therapy Dates: multiple over several years Prior Therapy Facilty/Provider(s): [facility], [facility], [facility], a place in [place] Reason for Treatment: Bipolar; PTSD; substance abuse Prior Outpatient Therapy: Prior Outpatient Therapy: Yes Prior Therapy Dates: ongoing Prior Therapy Facilty/Provider(s): [facility]  Reason for Treatment: SA, Bipolar, PTSD, medications management  Does patient have an ACCT team?: No Does patient have Intensive In-House Services?  : No Does patient have [facility] services? : Yes Does patient have P4CC services?: No  Current Facility-Administered Medications  Medication Dose Route Frequency Provider Last Rate Last Dose  . hydrOXYzine (ATARAX/VISTARIL) tablet 25 mg  25 mg Oral TID PRN Fransico Meadow, PA-C   25 mg at 01/31/15 0946  . ibuprofen (ADVIL,MOTRIN) tablet 400 mg  400 mg Oral Q6H PRN Fransico Meadow, PA-C   400 mg at 01/31/15 0640  . lamoTRIgine (LAMICTAL) tablet 25 mg  25 mg Oral BID Hollace Kinnier [place], PA-C   25 mg at 01/31/15 2127  . LORazepam (ATIVAN) tablet 1 mg  1 mg Oral BID PRN Fransico Meadow, PA-C   1 mg at 02/01/15 2094  . traZODone (DESYREL) tablet 50 mg  50 mg Oral QHS Delfin Gant, NP   50 mg at 01/31/15 2125  . valACYclovir (VALTREX) tablet 500 mg  500 mg Oral BID Hollace Kinnier [place], PA-C   500 mg at 01/31/15 2127  .  zolpidem (AMBIEN) tablet 10 mg  10 mg Oral QHS Hollace Kinnier [place], PA-C   10 mg at 01/31/15 2127   Current Outpatient Prescriptions  Medication Sig Dispense Refill  . hydrOXYzine (VISTARIL) 25 MG capsule Take 25 mg by mouth 3 (three) times daily as needed for anxiety.    Marland Kitchen ibuprofen (ADVIL,MOTRIN) 200 MG tablet Take 400 mg by mouth every 6 (six) hours as needed for moderate pain.    Marland Kitchen lamoTRIgine (LAMICTAL) 25 MG tablet Take 1 tablet (25 mg total) by mouth 2 (two)  times daily. 60 tablet 0  . LORazepam (ATIVAN) 1 MG tablet Take 1 mg by mouth 2 (two) times daily as needed for anxiety.    . valACYclovir (VALTREX) 500 MG tablet Take 500 mg by mouth 2 (two) times daily.    Marland Kitchen zolpidem (AMBIEN) 10 MG tablet Take 10 mg by mouth at bedtime.    . hydrOXYzine (ATARAX/VISTARIL) 25 MG tablet Take 1 tablet (25 mg total) by mouth 3 (three) times daily as needed for anxiety. 30 tablet 0  . lamoTRIgine (LAMICTAL) 25 MG tablet Take 1 tablet (25 mg total) by mouth 2 (two) times daily. 60 tablet 0  . traZODone (DESYREL) 50 MG tablet Take 1 tablet (50 mg total) by mouth at bedtime. 30 tablet 0  . valACYclovir (VALTREX) 500 MG tablet Take 1 tablet (500 mg total) by mouth 2 (two) times daily. 60 tablet 0    Musculoskeletal: Strength & Muscle Tone: within normal limits Gait & Station: normal Patient leans: N/A  Psychiatric Specialty Exam: Physical Exam  Review of Systems  Constitutional: Negative.   HENT: Negative.   Eyes: Negative.   Respiratory: Negative.   Cardiovascular: Negative.   Gastrointestinal: Negative.   Genitourinary: Negative.   Musculoskeletal: Negative.   Skin: Negative.   Neurological: Negative.   Endo/Heme/Allergies: Negative.     Blood pressure 106/68, pulse 76, temperature 98 F (36.7 C), temperature source Oral, resp. rate 18, SpO2 100 %.There is no weight on file to calculate BMI.  General Appearance: Casual  Eye Contact::  Minimal  Speech:  Clear and Coherent and Normal Rate  Volume:  Normal  Mood:  Depressed  Affect:  Congruent  Thought Process:  Coherent, Goal Directed and Intact  Orientation:  Full (Time, Place, and Person)  Thought Content:  WDL  Suicidal Thoughts:  No  Homicidal Thoughts:  No  Memory:  Immediate;   Good Recent;   Good Remote;   Good  Judgement:  Fair  Insight:  Fair  Psychomotor Activity:  Normal  Concentration:  Good  Recall:  NA  Fund of Knowledge:Fair  Language: Good  Akathisia:  NA  Handed:  Right   AIMS (if indicated):     Assets:  Desire for Improvement  ADL's:  Intact  Cognition: WNL  Sleep:      Medical Decision Making: Established Problem, Stable/Improving (1)  Disposition:   Discharge home, follow up with [facility] for long term substance abuse  Delfin Gant   PMHNP-BC 02/01/2015 8:50 AM Patient seen face-to-face for psychiatric evaluation, chart reviewed and case discussed with the physician extender and developed treatment plan. Reviewed the information documented and agree with the treatment plan. Corena Pilgrim, MD

## 2015-02-01 NOTE — [facility]HH Suicide Risk Assessment (Cosign Needed)
Suicide Risk Assessment  Discharge Assessment   [facility] Discharge Suicide Risk Assessment   Demographic Factors:  Male, Low socioeconomic status, Living alone and Unemployed  Total Time spent with patient: 20 minutes  Musculoskeletal: Strength & Muscle Tone: within normal limits Gait & Station: normal Patient leans: N/A  Psychiatric Specialty Exam:     Blood pressure 106/68, pulse 76, temperature 98 F (36.7 C), temperature source Oral, resp. rate 18, SpO2 100 %.There is no weight on file to calculate BMI.  General Appearance: Casual  Eye Contact:: Minimal  Speech: Clear and Coherent and Normal Rate  Volume: Normal  Mood: Depressed  Affect: Congruent  Thought Process: Coherent, Goal Directed and Intact  Orientation: Full (Time, Place, and Person)  Thought Content: WDL  Suicidal Thoughts: No  Homicidal Thoughts: No  Memory: Immediate; Good Recent; Good Remote; Good  Judgement: Fair  Insight: Fair  Psychomotor Activity: Normal  Concentration: Good  Recall: NA  Fund of Knowledge:Fair  Language: Good  Akathisia: NA  Handed: Right  AIMS (if indicated):    Assets: Desire for Improvement  ADL's: Intact  Cognition: WNL            Has this patient used any form of tobacco in the last 30 days? (Cigarettes, Smokeless Tobacco, Cigars, and/or Pipes) Yes, Prescription not provided because: Patient is not ready to quit smoking  Mental Status Per Nursing Assessment::   On Admission:     Current Mental Status by Physician: NA  Loss Factors: NA  Historical Factors: Prior suicide attempts and used to cut self  Risk Reduction Factors:   Positive coping skills or problem solving skills  Continued Clinical Symptoms:  Bipolar Disorder:   Depressive phase Alcohol/Substance Abuse/Dependencies  Cognitive Features That Contribute To Risk:  Polarized thinking    Suicide Risk:  Minimal: No identifiable suicidal ideation.   Patients presenting with no risk factors but with morbid ruminations; may be classified as minimal risk based on the severity of the depressive symptoms  Principal Problem: Bipolar disorder, current episode manic severe with psychotic features Discharge Diagnoses:  Patient Active Problem List   Diagnosis Date Noted  . Anxiety [F41.9]   . Substance induced mood disorder [F19.94] 12/08/2013  . Depression with suicidal ideation [F32.9, R45.851] 06/07/2013  . Cocaine abuse [F14.10] 06/29/2012  . Alcohol abuse [F10.10] 06/29/2012  . Bipolar disorder, current episode manic severe with psychotic features [F31.2] 04/22/2012    Class: Acute    Follow-up Information    Follow up with OSEI-BONSU,GEORGE, MD. Schedule an appointment as soon as possible for a visit on 01/31/2015.   Specialty:  Internal Medicine   Why:  This is your assigned medicaid [place] access doctor If you prefer another contact DSS [phone] after using the list of [place] medicaid providers given to you by the ED Case manager to assist with your choice of doctor    Contact information:   [address] [place] [address] [phone]       Plan Of Care/Follow-up recommendations:  Activity:  as tolerated Diet:  regular  Is patient on multiple antipsychotic therapies at discharge:  No   Has Patient had three or more failed trials of antipsychotic monotherapy by history:  No  Recommended Plan for Multiple Antipsychotic Therapies: NA    Acelin Ferdig C    PMHNP-BC 02/01/2015, 11:24 AM

## 2015-02-01 NOTE — ED Notes (Signed)
Patient denies SI, HI and AVH at this time. Patient calm and cooperative at this time. Plan of care discussed. Patient voices no complaints or concerns at this time. Encouragement and support provided and safety maintain. Q 15 min safety checks remain in place.

## 2015-02-01 NOTE — ED Notes (Signed)
Patient discharged to [facility].  Left the unit ambulatory with all belongings.  Patient was given a cab voucher to go to [facility]  He denies suicidal ideations or thoughts of harming others.

## 2015-03-18 ENCOUNTER — Encounter: Payer: Self-pay | Admitting: Emergency Medicine

## 2015-03-18 ENCOUNTER — Emergency Department
Admission: EM | Admit: 2015-03-18 | Discharge: 2015-03-19 | Disposition: A | Discharge status: Other Acute Inpatient Hospital | Payer: Medicaid Other | Attending: Emergency Medicine | Admitting: Emergency Medicine

## 2015-03-18 DIAGNOSIS — F419 Anxiety disorder, unspecified: Secondary | ICD-10-CM | POA: Insufficient documentation

## 2015-03-18 DIAGNOSIS — Z79899 Other long term (current) drug therapy: Secondary | ICD-10-CM | POA: Diagnosis not present

## 2015-03-18 DIAGNOSIS — R45851 Suicidal ideations: Secondary | ICD-10-CM

## 2015-03-18 DIAGNOSIS — F209 Schizophrenia, unspecified: Secondary | ICD-10-CM | POA: Diagnosis not present

## 2015-03-18 DIAGNOSIS — Z87891 Personal history of nicotine dependence: Secondary | ICD-10-CM | POA: Insufficient documentation

## 2015-03-18 LAB — COMPREHENSIVE METABOLIC PANEL
ALBUMIN: 3.9 g/dL (ref 3.5–5.0)
ALK PHOS: 92 U/L (ref 38–126)
ALT: 27 U/L (ref 17–63)
AST: 37 U/L (ref 15–41)
Anion gap: 6 (ref 5–15)
BUN: 19 mg/dL (ref 6–20)
CALCIUM: 9.1 mg/dL (ref 8.9–10.3)
CHLORIDE: 105 mmol/L (ref 101–111)
CO2: 29 mmol/L (ref 22–32)
CREATININE: 1.04 mg/dL (ref 0.61–1.24)
GFR calc Af Amer: >60 mL/min (ref >60)
GFR calc non Af Amer: >60 mL/min (ref >60)
GLUCOSE: 106 mg/dL — AB (ref 65–99)
Potassium: 4 mmol/L (ref 3.5–5.1)
SODIUM: 140 mmol/L (ref 135–145)
Total Bilirubin: 0.8 mg/dL (ref 0.3–1.2)
Total Protein: 7.1 g/dL (ref 6.5–8.1)

## 2015-03-18 LAB — CBC
HEMATOCRIT: 47.7 % (ref 40.0–52.0)
HEMOGLOBIN: 15.6 g/dL (ref 13.0–18.0)
MCH: 30.1 pg (ref 26.0–34.0)
MCHC: 32.7 g/dL (ref 32.0–36.0)
MCV: 91.9 fL (ref 80.0–100.0)
Platelets: 155 10*3/uL (ref 150–440)
RBC: 5.19 MIL/uL (ref 4.40–5.90)
RDW: 13.9 % (ref 11.5–14.5)
WBC: 6.3 10*3/uL (ref 3.8–10.6)

## 2015-03-18 LAB — ETHANOL: Alcohol, Ethyl (B): <5 mg/dL (ref <5)

## 2015-03-18 LAB — SALICYLATE LEVEL

## 2015-03-18 LAB — ACETAMINOPHEN LEVEL: Acetaminophen (Tylenol), Serum: <10 ug/mL — ABNORMAL LOW (ref 10–30)

## 2015-03-18 MED ORDER — HYDROXYZINE HCL 25 MG PO TABS
50.0000 mg | ORAL_TABLET | Freq: Three times a day (TID) | ORAL | Status: DC
  Administered 2015-03-18: 50 mg via ORAL
  Filled 2015-03-18 (×2): qty 2

## 2015-03-18 MED ORDER — LAMOTRIGINE 25 MG PO TABS
50.0000 mg | ORAL_TABLET | ORAL | Status: AC
  Administered 2015-03-18: 50 mg via ORAL
  Filled 2015-03-18: qty 2

## 2015-03-18 MED ORDER — ZOLPIDEM TARTRATE 5 MG PO TABS: 10.0000 mg | ORAL_TABLET | Freq: Every evening | ORAL | Status: DC | PRN

## 2015-03-18 NOTE — BH Assessment (Signed)
Assessment Note  [name] is an 54 y.o. male. who presents to [facility] ED after he was petitioned for Pt's involuntary commitment. Pt reports he was on a train back from [place] after the death of his grandmother and he attempted to jump off the train.  A person on the train called the police and they brought him to [facility].  Pt states he has been off his medications for the past week after they were lost on the bus ride up to [place].  Pt reports infrequently using crack and last used $30 worth, a couple of weeks ago. Pt denies any other substance abuse.    Pt reports he has a history of PSTD and Bipolar. He reports recent symptoms including social withdrawal, tearfulness, fatigue, loss of interest, decreased sleep, decreased appetite and frequent feelings of hopelessness and guilt. Pt reports suicidal thoughts for the past couple of  Days. Pt reports he attempted suicide approximately 4 times.  Pt states he is a "cutter." Pt denies homicidal ideation or history of violence. Pt states he does sometimes have auditory hallucinations. Pt denies access to firearms or other weapons. Pt denies any current substance use and/or abuse.    Pt identifies several stressors. Pt states his grandmother died, he's been off his medications, and his girlfriend left him. He identifies his brother, Tymon Nemetz ([phone]), as being family or friends that are supportive.  He states he is currently unemployed.  Pt lives with his brother and is able to return to his current living situation after he is stable. Pt denies any physical, verbal, sexual abuse currently or as a child. Pt denies legal problems.   Pt is dressed in hospital scrubs, alert, oriented x4 with normal speech and normal motor behavior. Eye contact is good. Pt's mood is depressed and anxious and affect is congruent with mood. Thought process is coherent and relevant. Cognitive functioning and fund of knowledge is intact and age appropriate. There are no  signs of delusions or bizarre behaviors. . Pt was pleasant and cooperative throughout assessment. Pt is currently seen at [facility] in [place], but hasn't been in the past two months. Pt has a long history of inpatient hospitalization at [facility], [facility], and a place in [place].  Pt wants to psychiatrically hospitalized and feels that treatment will help him to become stabilized.    Diagnosis: Depression, Bipolar  Past Medical History:  Past Medical History  Diagnosis Date  . Depression   . Arthritis   . Bipolar 1 disorder (HCC)   . PTSD (post-traumatic stress disorder)   . Schizophrenia (HCC)   . Balanitis     History reviewed. No pertinent past surgical history.  Family History: History reviewed. No pertinent family history.  Social History:  reports that he has quit smoking. He has never used smokeless tobacco. He reports that he drinks alcohol. He reports that he uses illicit drugs (Marijuana and Cocaine).  Additional Social History:  Alcohol / Drug Use Pain Medications: None reported Prescriptions: None reported Over the Counter: None reported History of alcohol / drug use?: Yes Longest period of sobriety (when/how long): unknown Substance #1 Name of Substance 1: Crack 1 - Age of First Use: 31 1 - Amount (size/oz): $30 1 - Frequency: "once and a while" 1 - Duration: 20 years 1 - Last Use / Amount: 2 weeks ago  CIWA: CIWA-Ar BP: 135/90 mmHg Pulse Rate: 63 COWS:    Allergies:  Allergies  Allergen Reactions  . Lithium Other (See Comments)  caused muscle twitching episodes  . Hydrocodone-Acetaminophen Rash    Home Medications:  (Not in a hospital admission)  OB/GYN Status:  No LMP for male patient.  General Assessment Data [facility] Assessment: In system Is this a Tele or Face-to-Face Assessment?: Face-to-Face Is this an Initial Assessment or a Re-assessment for this encounter?: Initial Assessment Marital status: Single Is patient  pregnant?: No Pregnancy Status: No Living Arrangements: Other relatives Can pt return to current living arrangement?: Yes Admission Status: Involuntary Is patient capable of signing voluntary admission?: Yes Referral Source: Self/Family/Friend Insurance type: Medicaid     Crisis Care Plan Living Arrangements: Other relatives Name of Psychiatrist: [facility] Name of Therapist: n/a  Education Status Is patient currently in school?: No Current Grade: N/a Highest grade of school patient has completed: n/a Name of school: n/a Contact person: Annye Ruskrskine Knittle [phone]  Risk to self with the past 6 months Suicidal Ideation: Yes-Currently Present Has patient been a risk to self within the past 6 months prior to admission? : Yes Suicidal Intent: Yes-Currently Present Has patient had any suicidal intent within the past 6 months prior to admission? : Yes Is patient at risk for suicide?: Yes Suicidal Plan?: Yes-Currently Present Has patient had any suicidal plan within the past 6 months prior to admission? : Yes Specify Current Suicidal Plan: jump out of the train he was on  Access to Means: Yes Specify Access to Suicidal Means: Pt was on the train at the time What has been your use of drugs/alcohol within the last 12 months?: Crack Cocaine Previous Attempts/Gestures: Yes How many times?: 4 Other Self Harm Risks: Cutting Triggers for Past Attempts: Family contact, Hallucinations Intentional Self Injurious Behavior: Cutting Comment - Self Injurious Behavior: h/o cutting Family Suicide History: No Recent stressful life event(s): Loss (Comment) (grandmother died, g/f broke up with him) Persecutory voices/beliefs?: No Depression: Yes Depression Symptoms: Fatigue, Guilt, Tearfulness, Feeling worthless/self pity Substance abuse history and/or treatment for substance abuse?: Yes  Risk to Others within the past 6 months Homicidal Ideation: No Does patient have any lifetime risk of violence  toward others beyond the six months prior to admission? : No Thoughts of Harm to Others: No Current Homicidal Intent: No Current Homicidal Plan: No Access to Homicidal Means: No Identified Victim: none History of harm to others?: No Assessment of Violence: None Noted Violent Behavior Description: None reported Does patient have access to weapons?: No Criminal Charges Pending?: No Does patient have a court date: No Is patient on probation?: No  Psychosis Hallucinations: Auditory Delusions: None noted  Mental Status Report Appearance/Hygiene: Unremarkable Eye Contact: Good Motor Activity: Unremarkable Speech: Rapid Level of Consciousness: Alert Mood: Depressed, Anxious Affect: Appropriate to circumstance Anxiety Level: Moderate Thought Processes: Coherent, Relevant Judgement: Impaired Orientation: Person, Place, Time, Situation Obsessive Compulsive Thoughts/Behaviors: None  Cognitive Functioning Concentration: Normal Memory: Recent Intact, Remote Intact IQ: Average Insight: Poor Impulse Control: Poor Appetite: Poor Weight Loss: 0 Weight Gain: 0 Sleep: Decreased Total Hours of Sleep: 6 Vegetative Symptoms: None  ADLScreening [facility] Assessment Services) Patient's cognitive ability adequate to safely complete daily activities?: Yes Patient able to express need for assistance with ADLs?: Yes Independently performs ADLs?: Yes (appropriate for developmental age)  Prior Inpatient Therapy Prior Inpatient Therapy: Yes Prior Therapy Dates: Pt has had multiple inpt. hospitalizations Prior [facility], [facility], [facility], a place in [place] Reason for Treatment: Bipolar; PTSD; substance abuse  Prior Outpatient Therapy Prior Outpatient Therapy: Yes Prior Therapy Dates:  (2 months  ago) Prior Therapy Facilty/Provider(s): [facility] Reason for Treatment: PTSD Bipolar Does patient have an ACCT team?: No Does patient have Intensive In-House Services?  : No Does patient  have [facility] services? : No Does patient have P4CC services?: No  ADL Screening (condition at time of admission) Patient's cognitive ability adequate to safely complete daily activities?: Yes Patient able to express need for assistance with ADLs?: Yes Independently performs ADLs?: Yes (appropriate for developmental age)       Abuse/Neglect Assessment (Assessment to be complete while patient is alone) Physical Abuse: Denies Verbal Abuse: Denies Sexual Abuse: Denies Exploitation of patient/patient's resources: Denies Self-Neglect: Denies Values / Beliefs Cultural Requests During Hospitalization: None Spiritual Requests During Hospitalization: None Consults Spiritual Care Consult Needed: No Social Work Consult Needed: No Merchant navy officer (For Healthcare) Does patient have an advance directive?: No Would patient like information on creating an advanced directive?: No - patient declined information    Additional Information 1:1 In Past 12 Months?: No CIRT Risk: No Elopement Risk: No     Disposition:  Disposition Initial Assessment Completed for this Encounter: Yes Disposition of Patient: Inpatient treatment program (per Dr. Guss Bunde) Type of inpatient treatment program: Adult  On Site Evaluation by:   Reviewed with Physician:    Ramon Dredge Jazae Gandolfi 03/18/2015 6:12 PM

## 2015-03-18 NOTE — Consult Note (Signed)
[place] Psychiatry Consult   Reason for Consult:  Follow up Referring Physician:  Er Patient Identification: [name] MRN:  [id number] Principal Diagnosis: <principal problem not specified> Diagnosis:   Patient Active Problem List   Diagnosis Date Noted  . Anxiety [F41.9]   . Substance induced mood disorder ([place]) [F19.94] 12/08/2013  . Depression with suicidal ideation [F32.9, R45.851] 06/07/2013  . Cocaine abuse [F14.10] 06/29/2012  . Alcohol abuse [F10.10] 06/29/2012  . Bipolar disorder, current episode manic severe with psychotic features ([place]) [F31.2] 04/22/2012    Class: Acute    Total Time spent with patient: 45 minutes  Subjective:   [name] is a 54 y.o. male patient admitted with a long H/O depression and was coming from [place] to [place] and on the way he tried to jump off the train as he was depressed.  HPI:  Pt was on Lamictal and Vistaril and Trazodone and his bag was lost and so he could not take his meds.  Past Psychiatric History: H/O Inpt to psychiatry many times.  Risk to Self: Is patient at risk for suicide?: Yes Risk to Others:   Prior Inpatient Therapy:   Prior Outpatient Therapy:    Past Medical History:  Past Medical History  Diagnosis Date  . Depression   . Arthritis   . Bipolar 1 disorder ([place])   . PTSD (post-traumatic stress disorder)   . Schizophrenia ([place])   . Balanitis    History reviewed. No pertinent past surgical history. Family History: History reviewed. No pertinent family history. Family Psychiatric  History: none Social History:  History  Alcohol Use  . 0.0 oz/week    Comment: 2 six packs/day     History  Drug Use  . Yes  . Special: Marijuana, Cocaine    Social History   Social History  . Marital Status: Single    Spouse Name: N/A  . Number of Children: N/A  . Years of Education: N/A   Social History Main Topics  . Smoking status: Former Smoker -- 0.00 packs/day  . Smokeless tobacco: Never  Used  . Alcohol Use: 0.0 oz/week     Comment: 2 six packs/day  . Drug Use: Yes    Special: Marijuana, Cocaine  . Sexual Activity: Not Currently    Birth Control/ Protection: Condom   Other Topics Concern  . None   Social History Narrative   Additional Social History:                          Allergies:   Allergies  Allergen Reactions  . Lithium Other (See Comments)    caused muscle twitching episodes  . Hydrocodone-Acetaminophen Rash    Labs:  Results for orders placed or performed during the hospital encounter of 03/18/15 (from the past 48 hour(s))  Comprehensive metabolic panel     Status: Abnormal   Collection Time: 03/18/15  3:40 PM  Result Value Ref Range   Sodium 140 135 - 145 mmol/L   Potassium 4.0 3.5 - 5.1 mmol/L   Chloride 105 101 - 111 mmol/L   CO2 29 22 - 32 mmol/L   Glucose, Bld 106 (H) 65 - 99 mg/dL   BUN 19 6 - 20 mg/dL   Creatinine, Ser 1.04 0.61 - 1.24 mg/dL   Calcium 9.1 8.9 - 10.3 mg/dL   Total Protein 7.1 6.5 - 8.1 g/dL   Albumin 3.9 3.5 - 5.0 g/dL   AST 37 15 -  41 U/L   ALT 27 17 - 63 U/L   Alkaline Phosphatase 92 38 - 126 U/L   Total Bilirubin 0.8 0.3 - 1.2 mg/dL   GFR calc non Af Amer >60 >60 mL/min   GFR calc Af Amer >60 >60 mL/min    Comment: (NOTE) The eGFR has been calculated using the CKD EPI equation. This calculation has not been validated in all clinical situations. eGFR's persistently <60 mL/min signify possible Chronic Kidney Disease.    Anion gap 6 5 - 15  Ethanol (ETOH)     Status: None   Collection Time: 03/18/15  3:40 PM  Result Value Ref Range   Alcohol, Ethyl (B) <5 <5 mg/dL    Comment:        LOWEST DETECTABLE LIMIT FOR SERUM ALCOHOL IS 5 mg/dL FOR MEDICAL PURPOSES ONLY   Salicylate level     Status: None   Collection Time: 03/18/15  3:40 PM  Result Value Ref Range   Salicylate Lvl <2.5 2.8 - 30.0 mg/dL  Acetaminophen level     Status: Abnormal   Collection Time: 03/18/15  3:40 PM  Result Value Ref  Range   Acetaminophen (Tylenol), Serum <10 (L) 10 - 30 ug/mL    Comment:        THERAPEUTIC CONCENTRATIONS VARY SIGNIFICANTLY. A RANGE OF 10-30 ug/mL MAY BE AN EFFECTIVE CONCENTRATION FOR MANY PATIENTS. HOWEVER, SOME ARE BEST TREATED AT CONCENTRATIONS OUTSIDE THIS RANGE. ACETAMINOPHEN CONCENTRATIONS >150 ug/mL AT 4 HOURS AFTER INGESTION AND >50 ug/mL AT 12 HOURS AFTER INGESTION ARE OFTEN ASSOCIATED WITH TOXIC REACTIONS.   CBC     Status: None   Collection Time: 03/18/15  3:40 PM  Result Value Ref Range   WBC 6.3 3.8 - 10.6 K/uL   RBC 5.19 4.40 - 5.90 MIL/uL   Hemoglobin 15.6 13.0 - 18.0 g/dL   HCT 47.7 40.0 - 52.0 %   MCV 91.9 80.0 - 100.0 fL   MCH 30.1 26.0 - 34.0 pg   MCHC 32.7 32.0 - 36.0 g/dL   RDW 13.9 11.5 - 14.5 %   Platelets 155 150 - 440 K/uL    No current facility-administered medications for this encounter.   Current Outpatient Prescriptions  Medication Sig Dispense Refill  . hydrOXYzine (ATARAX/VISTARIL) 25 MG tablet Take 1 tablet (25 mg total) by mouth 3 (three) times daily as needed for anxiety. 30 tablet 0  . ibuprofen (ADVIL,MOTRIN) 200 MG tablet Take 400 mg by mouth every 6 (six) hours as needed for moderate pain.    Marland Kitchen lamoTRIgine (LAMICTAL) 25 MG tablet Take 1 tablet (25 mg total) by mouth 2 (two) times daily. 60 tablet 0  . traZODone (DESYREL) 50 MG tablet Take 1 tablet (50 mg total) by mouth at bedtime. 30 tablet 0  . valACYclovir (VALTREX) 500 MG tablet Take 1 tablet (500 mg total) by mouth 2 (two) times daily. 60 tablet 0    Musculoskeletal: Strength & Muscle Tone: within normal limits Gait & Station: normal Patient leans: N/A  Psychiatric Specialty Exam: Review of Systems  All other systems reviewed and are negative.   Blood pressure 135/90, pulse 63, temperature 98.2 F (36.8 C), temperature source Oral, resp. rate 16, height 6' 3"  (1.905 m), weight 230 lb (104.327 kg), SpO2 100 %.Body mass index is 28.75 kg/(m^2).  General Appearance:  Casual  Eye Contact::  Fair  Speech:  Clear and Coherent  Volume:  Normal  Mood:  Anxious, Depressed, Dysphoric, Worthless and useless.  Affect:  Constricted  Thought Process:  Circumstantial  Orientation:  Full (Time, Place, and Person)  Thought Content:  Rumination  Suicidal Thoughts:  Yes.  with intent/plan" tried to jump off train"  Homicidal Thoughts:  No  Memory:  Immediate;   Fair Recent;   Fair Remote;   Fair adequate  Judgement:  Fair  Insight:  Fair  Psychomotor Activity:  Normal  Concentration:  Fair  Recall:  [employer]  Language: Fair  Akathisia:  No  Handed:  Right  AIMS (if indicated):     Assets:  Communication Skills Desire for Improvement Financial Resources/Insurance Housing Social Support Transportation Vocational/Educational  ADL's:  Intact  Cognition: WNL  Sleep:      Treatment Plan Summary: Plan Admit Inpt Bh after pt is medically cleared and stable and bed is available  Disposition: Recommend psychiatric Inpatient admission when medically cleared.  Dewain Penning 03/18/2015 5:48 PM

## 2015-03-18 NOTE — ED Notes (Signed)
Pt has history of schizophrenia - off meds x 1 week. Was on train back to [place] and felt he wanted to jump off so call ems

## 2015-03-18 NOTE — ED Provider Notes (Signed)
[facility] Emergency Department Provider Note  ____________________________________________  Time seen: 1745  I have reviewed the triage vital signs and the nursing notes.   HISTORY  Chief Complaint Suicidal     HPI JEET SHOUGH is a 54 y.o. male with history of schizophrenia. He has been off his meds for 1 week. Apparently he traveled by train to his mother's funeral. He lives in [place]. Today he was returning to [facility] by train when he became anxious, agitated, and had the train stopped. EMS was called. The patient was reporting suicidal thoughts and the patient was brought to the emergency department.  In the emergency department, the patient appears anxious but is cooperative and communicative. He reports he has been off his medications and reports "I am not doing well". He confirms he had suicidal thoughts before but he is unclear if he has ongoing suicidal thoughts at this time. He denies any auditory hallucinations.  Patient does report that he has a history of arthritis in his left knee is bothering him. He requests ibuprofen for this discomfort at this time.   Past Medical History  Diagnosis Date  . Depression   . Arthritis   . Bipolar 1 disorder (HCC)   . PTSD (post-traumatic stress disorder)   . Schizophrenia (HCC)   . Balanitis     Patient Active Problem List   Diagnosis Date Noted  . Anxiety   . Substance induced mood disorder (HCC) 12/08/2013  . Depression with suicidal ideation 06/07/2013  . Cocaine abuse 06/29/2012  . Alcohol abuse 06/29/2012  . Bipolar disorder, current episode manic severe with psychotic features (HCC) 04/22/2012    Class: Acute    History reviewed. No pertinent past surgical history.  Current Outpatient Rx  Name  Route  Sig  Dispense  Refill  . hydrOXYzine (ATARAX/VISTARIL) 25 MG tablet   Oral   Take 1 tablet (25 mg total) by mouth 3 (three) times daily as needed for anxiety.   30 tablet   0    . ibuprofen (ADVIL,MOTRIN) 200 MG tablet   Oral   Take 400 mg by mouth every 6 (six) hours as needed for moderate pain.         Marland Kitchen lamoTRIgine (LAMICTAL) 25 MG tablet   Oral   Take 1 tablet (25 mg total) by mouth 2 (two) times daily.   60 tablet   0   . traZODone (DESYREL) 50 MG tablet   Oral   Take 1 tablet (50 mg total) by mouth at bedtime.   30 tablet   0   . valACYclovir (VALTREX) 500 MG tablet   Oral   Take 1 tablet (500 mg total) by mouth 2 (two) times daily.   60 tablet   0     Allergies Lithium and Hydrocodone-acetaminophen  History reviewed. No pertinent family history.  Social History Social History  Substance Use Topics  . Smoking status: Former Smoker -- 0.00 packs/day  . Smokeless tobacco: Never Used  . Alcohol Use: 0.0 oz/week     Comment: 2 six packs/day    Review of Systems  Constitutional: Negative for fever. ENT: Negative for sore throat. Cardiovascular: Negative for chest pain. Respiratory: Negative for cough. Gastrointestinal: Negative for abdominal pain, vomiting and diarrhea. Genitourinary: Negative for dysuria. Musculoskeletal: History of arthritis with pain and left knee currently.. Skin: Negative for rash. Neurological: Negative for paresthesia or weakness Psychiatric: History of schizophrenia. Off meds. See history of present illness  10-point ROS otherwise negative.  ____________________________________________   PHYSICAL EXAM:  VITAL SIGNS: ED Triage Vitals  Enc Vitals Group     BP 03/18/15 1523 135/90 mmHg     Pulse Rate 03/18/15 1523 63     Resp 03/18/15 1523 16     Temp 03/18/15 1523 98.2 F (36.8 C)     Temp Source 03/18/15 1523 Oral     SpO2 03/18/15 1523 100 %     Weight 03/18/15 1523 230 lb (104.327 kg)     Height 03/18/15 1523 6\' 3"  (1.905 m)     Head Cir --      Peak Flow --      Pain Score --      Pain Loc --      Pain Edu? --      Excl. in GC? --     Constitutional:  Alert and communicative. Well  appearing and in no distress. ENT   Head: Normocephalic and atraumatic.   Nose: No congestion/rhinnorhea.    Cardiovascular: Normal rate, regular rhythm, no murmur noted Respiratory:  Normal respiratory effort, no tachypnea.    Breath sounds are clear and equal bilaterally.  Gastrointestinal: Soft and nontender. No distention.  Back: No muscle spasm, no tenderness, no CVA tenderness. Musculoskeletal: Patient complains of pain and left knee. No deformity noted. Nontender with normal range of motion in all extremities.  No noted edema. Neurologic:  Normal speech and language. No gross focal neurologic deficits are appreciated.  Skin:  Skin is warm, dry. No rash noted. Psychiatric: Alert and communicative. Denies auditory hallucinations. Question of ongoing suicidal ideation..  ____________________________________________    LABS (pertinent positives/negatives)  Labs Reviewed  COMPREHENSIVE METABOLIC PANEL - Abnormal; Notable for the following:    Glucose, Bld 106 (*)    All other components within normal limits  ACETAMINOPHEN LEVEL - Abnormal; Notable for the following:    Acetaminophen (Tylenol), Serum <10 (*)    All other components within normal limits  ETHANOL  SALICYLATE LEVEL  CBC  URINE RAPID DRUG SCREEN, HOSP PERFORMED    ____________________________________________   INITIAL IMPRESSION / ASSESSMENT AND PLAN / ED COURSE  Pertinent labs & imaging results that were available during my care of the patient were reviewed by me and considered in my medical decision making (see chart for details).  Pleasant cooperative 54 year old male with a history of schizophrenia, now off his meds with some degree of anxiety and some suicidal ideation prior to arrival. The patient is been seen by psychiatry, Dr. Guss Bundehalla, who will admit the patient for ongoing psychiatric care.  ____________________________________________   FINAL CLINICAL IMPRESSION(S) / ED DIAGNOSES  Final  diagnoses:  Schizophrenia, unspecified type [facility](HCC)  Anxiety  Suicidal ideation      Darien Ramusavid W  Wenzlick, MD 03/18/15 2340

## 2015-03-18 NOTE — ED Notes (Signed)
Per inpatient behavioral health, they can not take the pt until another nurse comes in at 11pm. Pt made aware.

## 2015-03-18 NOTE — ED Notes (Signed)
Report received from susan, rn. Pt to be admitted, pt currently sleeping.

## 2015-03-18 NOTE — ED Notes (Signed)
Report to kim, rn in behavioural health.

## 2015-03-19 ENCOUNTER — Encounter: Payer: Self-pay | Admitting: *Deleted

## 2015-03-19 ENCOUNTER — Inpatient Hospital Stay
Admission: RE | Admit: 2015-03-19 | Discharge: 2015-03-26 | DRG: 885 | Disposition: A | Discharge status: Home / Self Care | Payer: Medicaid Other | Source: Intra-hospital | Attending: Psychiatry | Admitting: Psychiatry

## 2015-03-19 DIAGNOSIS — Z888 Allergy status to other drugs, medicaments and biological substances status: Secondary | ICD-10-CM

## 2015-03-19 DIAGNOSIS — Z79899 Other long term (current) drug therapy: Secondary | ICD-10-CM

## 2015-03-19 DIAGNOSIS — F411 Generalized anxiety disorder: Secondary | ICD-10-CM | POA: Diagnosis present

## 2015-03-19 DIAGNOSIS — Z9114 Patient's other noncompliance with medication regimen: Secondary | ICD-10-CM

## 2015-03-19 DIAGNOSIS — G4700 Insomnia, unspecified: Secondary | ICD-10-CM | POA: Diagnosis present

## 2015-03-19 DIAGNOSIS — F1010 Alcohol abuse, uncomplicated: Secondary | ICD-10-CM | POA: Diagnosis present

## 2015-03-19 DIAGNOSIS — N481 Balanitis: Secondary | ICD-10-CM | POA: Diagnosis present

## 2015-03-19 DIAGNOSIS — F314 Bipolar disorder, current episode depressed, severe, without psychotic features: Principal | ICD-10-CM | POA: Diagnosis present

## 2015-03-19 DIAGNOSIS — F1290 Cannabis use, unspecified, uncomplicated: Secondary | ICD-10-CM | POA: Diagnosis present

## 2015-03-19 DIAGNOSIS — Z915 Personal history of self-harm: Secondary | ICD-10-CM

## 2015-03-19 DIAGNOSIS — M1990 Unspecified osteoarthritis, unspecified site: Secondary | ICD-10-CM | POA: Diagnosis present

## 2015-03-19 DIAGNOSIS — F22 Delusional disorders: Secondary | ICD-10-CM | POA: Diagnosis present

## 2015-03-19 DIAGNOSIS — Z87891 Personal history of nicotine dependence: Secondary | ICD-10-CM

## 2015-03-19 DIAGNOSIS — R45851 Suicidal ideations: Secondary | ICD-10-CM | POA: Diagnosis present

## 2015-03-19 DIAGNOSIS — F1020 Alcohol dependence, uncomplicated: Secondary | ICD-10-CM | POA: Diagnosis present

## 2015-03-19 DIAGNOSIS — Z818 Family history of other mental and behavioral disorders: Secondary | ICD-10-CM

## 2015-03-19 DIAGNOSIS — F1420 Cocaine dependence, uncomplicated: Secondary | ICD-10-CM | POA: Diagnosis present

## 2015-03-19 LAB — RAPID HIV SCREEN (HIV 1/2 AB+AG)
HIV 1/2 ANTIBODIES: NONREACTIVE
HIV-1 P24 ANTIGEN - HIV24: NONREACTIVE

## 2015-03-19 MED ORDER — RISPERIDONE 1 MG PO TABS
2.0000 mg | ORAL_TABLET | Freq: Two times a day (BID) | ORAL | Status: DC
  Administered 2015-03-19 – 2015-03-21 (×5): 2 mg via ORAL
  Filled 2015-03-19 (×5): qty 2

## 2015-03-19 MED ORDER — VALACYCLOVIR HCL 500 MG PO TABS
500.0000 mg | ORAL_TABLET | Freq: Every day | ORAL | Status: DC
  Administered 2015-03-19: 500 mg via ORAL
  Filled 2015-03-19: qty 1

## 2015-03-19 MED ORDER — ALUM & MAG HYDROXIDE-SIMETH 200-200-20 MG/5ML PO SUSP: 30.0000 mL | ORAL | Status: DC | PRN

## 2015-03-19 MED ORDER — ZOLPIDEM TARTRATE 5 MG PO TABS
10.0000 mg | ORAL_TABLET | Freq: Every evening | ORAL | Status: DC | PRN
  Administered 2015-03-19 – 2015-03-25 (×7): 10 mg via ORAL
  Filled 2015-03-19 (×7): qty 2

## 2015-03-19 MED ORDER — ACETAMINOPHEN 325 MG PO TABS: 650.0000 mg | ORAL_TABLET | Freq: Four times a day (QID) | ORAL | Status: DC | PRN

## 2015-03-19 MED ORDER — LAMOTRIGINE 25 MG PO TABS
25.0000 mg | ORAL_TABLET | Freq: Every day | ORAL | Status: DC
  Administered 2015-03-19 – 2015-03-20 (×2): 25 mg via ORAL
  Filled 2015-03-19 (×2): qty 1

## 2015-03-19 MED ORDER — BACITRACIN-NEOMYCIN-POLYMYXIN 400-5-5000 EX OINT
TOPICAL_OINTMENT | Freq: Two times a day (BID) | CUTANEOUS | Status: DC
  Administered 2015-03-19: 16:00:00 via TOPICAL
  Administered 2015-03-20: 1 via TOPICAL
  Administered 2015-03-21: 22:00:00 via TOPICAL
  Administered 2015-03-22: 1 via TOPICAL
  Administered 2015-03-23: 2 via TOPICAL
  Administered 2015-03-23: 1 via TOPICAL
  Administered 2015-03-24 (×2): 2 via TOPICAL
  Administered 2015-03-25 – 2015-03-26 (×3): 1 via TOPICAL
  Filled 2015-03-19 (×20): qty 1

## 2015-03-19 MED ORDER — IBUPROFEN 600 MG PO TABS
600.0000 mg | ORAL_TABLET | Freq: Three times a day (TID) | ORAL | Status: DC
  Administered 2015-03-19 – 2015-03-26 (×22): 600 mg via ORAL
  Filled 2015-03-19 (×21): qty 1

## 2015-03-19 MED ORDER — MAGNESIUM HYDROXIDE 400 MG/5ML PO SUSP: 30.0000 mL | Freq: Every day | ORAL | Status: DC | PRN

## 2015-03-19 MED ORDER — HYDROCORTISONE 1 % EX CREA
TOPICAL_CREAM | Freq: Two times a day (BID) | CUTANEOUS | Status: DC
  Administered 2015-03-19 – 2015-03-25 (×8): via TOPICAL
  Administered 2015-03-25: 1 via TOPICAL
  Filled 2015-03-19 (×3): qty 28

## 2015-03-19 MED ORDER — HYDROXYZINE HCL 25 MG PO TABS
25.0000 mg | ORAL_TABLET | Freq: Three times a day (TID) | ORAL | Status: DC | PRN
  Administered 2015-03-19 – 2015-03-20 (×2): 25 mg via ORAL
  Filled 2015-03-19 (×4): qty 1

## 2015-03-19 MED ORDER — VALACYCLOVIR HCL 500 MG PO TABS
500.0000 mg | ORAL_TABLET | Freq: Two times a day (BID) | ORAL | Status: DC
  Administered 2015-03-19 – 2015-03-26 (×14): 500 mg via ORAL
  Filled 2015-03-19 (×16): qty 1

## 2015-03-19 MED ORDER — TRAZODONE HCL 100 MG PO TABS: 100.0000 mg | ORAL_TABLET | Freq: Every day | ORAL | Status: DC

## 2015-03-19 NOTE — Plan of Care (Signed)
Problem: Consults Goal: Suicide Risk Patient Education (See Patient Education module for education specifics)  Outcome: Progressing Patient knows the warning signs of SI.  Patient has been told to contact staff if these symptoms worsen.  Patient has verbally contracted for safety this shift.

## 2015-03-19 NOTE — Progress Notes (Signed)
D: patient was observed in his room for most of the shift.  Patient stated that he was still having feeling of SI but verbally contracted for safety.  Patient stated that he was still very depressed.  Patient affect is depressed and flat.  Patient in no distress at this time.  Patient stated he was having auditory and visual hallucinations at times  A: support and encouragement provided.  Medications given as prescribed.  q 15 min checks done  R: patient receptive of information but still remains to be isolative to his room for most of the shift.

## 2015-03-19 NOTE — Tx Team (Signed)
Initial Interdisciplinary Treatment Plan   PATIENT STRESSORS: Loss of grandmother Medication change or noncompliance   PATIENT STRENGTHS: Ability for insight Average or above average intelligence Capable of independent living Communication skills General fund of knowledge Motivation for treatment/growth Physical Health Supportive family/friends   PROBLEM LIST: Problem List/Patient Goals Date to be addressed Date deferred Reason deferred Estimated date of resolution  "Help with my depression" 03/19/2015     "My medication" 03/19/2015           Depression 03/19/2015     Increased risk for suicide 03/19/2015                              DISCHARGE CRITERIA:  Ability to meet basic life and health needs Adequate post-discharge living arrangements Improved stabilization in mood, thinking, and/or behavior Medical problems require only outpatient monitoring Motivation to continue treatment in a less acute level of care Need for constant or close observation no longer present Reduction of life-threatening or endangering symptoms to within safe limits Safe-care adequate arrangements made Verbal commitment to aftercare and medication compliance  PRELIMINARY DISCHARGE PLAN: Outpatient therapy Participate in family therapy Return to previous living arrangement  PATIENT/FAMIILY INVOLVEMENT: This treatment plan has been presented to and reviewed with the patient, Paulla DollyVincent Y Shallenberger, and/or family member.  The patient and family have been given the opportunity to ask questions and make suggestions.  Fransico MichaelBrooks, Daffney Greenly Laverne 03/19/2015, 1:29 AM
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
## 2015-03-19 NOTE — Progress Notes (Signed)
Recreation Therapy Notes  Date: 10.24.16 Time: 3:00 pm Location: Craft Room  Group Topic: Self-expression  Goal Area(s) Addresses:  Patient will effectively use art as a mean's of self-expression. Patient will recognize positive benefit of self-expression. Patient will be able to identify one emotion experienced during group session. Patient will identify use of art/self-expression as a coping skill.  Behavioral Response: Did not attend  Intervention: Two Faces of Me  Activity: Patients were given a blank face worksheet and instructed to draw or write how they felt when they were admitted to the hospital on one side and draw or write how they want to feel when they are discharged from the hospital on the other side.  Education: LRT educated patients on other forms of self-expression.  Education Outcome: Patient did not attend group.   Clinical Observations/Feedback: Patient did not attend group.  Jacquelynn CreeGreene,Hailley Byers M, LRT/CTRS 03/19/2015 4:39 PM

## 2015-03-19 NOTE — [facility] Group Notes (Signed)
[facility] Group Notes:  (Nursing/MHT/Case Management/Adjunct)  Date:  03/19/2015  Time:  12:55 PM  Type of Therapy:  Psychoeducational Skills  Participation Level:  Did Not Attend   Darrow BussingMarly S Jaydan Meidinger 03/19/2015, 12:55 PM

## 2015-03-19 NOTE — B[facility] Counselor (Signed)
Pt. is to be admitted to [facility] by Dr. Guss Bundehalla. Attending Physician will be Dr. Ardyth HarpsHernandez.  Pt. has been assigned to room 315, by [facility] Charge Nurse Melissa. Report has already been called and Patient Access Lyla Son(Carrie) has been made aware of the admission.

## 2015-03-19 NOTE — Progress Notes (Signed)
Recreation Therapy Notes  INPATIENT RECREATION THERAPY ASSESSMENT  Patient Details Name: [name] MRN: [id number] DOB: 05/17/1961 Today's Date: 03/19/2015  Patient Stressors: Family, Relationship, Death, Friends, Work, Other (Comment) (Stressful relationship with mother; broke up with girlfriend recently; grandmother passed away; lack of friends; trying to get stabilized)  Coping Skills:   Isolate, Avoidance, Self-Injury, Exercise, Art/Dance, Talking, Music, Sports, Other (Comment) Naval architect(Praying)  Personal Challenges: Anger, Communication, Concentration, Decision-Making, Expressing Yourself, Problem-Solving, Relationships, Self-Esteem/Confidence, Social Interaction, Stress Management, Substance Abuse, Time Management, Trusting Others, Work Nutritional therapisterformance  Leisure Interests (2+):  Music - Listen, Individual - Other (Comment) (Read the Bible)  Awareness of Community Resources:  No  Community Resources:     Current Use:    If no, Barriers?:    Patient Strengths:  Intellectual, spiritual  Patient Identified Areas of Improvement:  [name]  Current Recreation Participation:  Listening to music  Patient Goal for Hospitalization:  To get back on the right medication  [place]idence[place]  County of Residence:  [place]   Current [place] (including self-harm):  No  Current HI:  No  Consent to Intern Participation: N/A   [name],[name], LRT/CTRS 03/19/2015, 1:53 PM
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
## 2015-03-19 NOTE — [facility] Suicide Risk Assessment (Signed)
[name]   Nursing information obtained from:    Demographic factors:    Current Mental Status:    Loss Factors:    Historical Factors:    Risk Reduction Factors:    Total Time spent with patient: 1 hour Principal Problem: Bipolar I disorder, most recent episode depressed, severe without psychotic features (HCC) Diagnosis:   Patient Active Problem List   Diagnosis Date Noted  . Anxiety [F41.9]   . Substance induced mood disorder (HCC) [F19.94] 12/08/2013  . Cocaine use disorder, moderate, dependence (HCC) [F14.20] 06/29/2012  . Alcohol use disorder, moderate, dependence (HCC) [F10.20] 06/29/2012  . Bipolar I disorder, most recent episode depressed, severe without psychotic features (HCC) [F31.4] 04/22/2012    Class: Acute     Continued Clinical Symptoms:  Alcohol Use Disorder Identification Test Final Score (AUDIT): 0 The "Alcohol Use Disorders Identification Test", Guidelines for Use in Primary Care, Second Edition.  [name](WHO). Score between 0-7:  no or low risk or alcohol related problems. Score between 8-15:  moderate risk of alcohol related problems. Score between 16-19:  high risk of alcohol related problems. Score 20 or above:  warrants further diagnostic evaluation for alcohol dependence and treatment.   CLINICAL FACTORS:   Bipolar Disorder:   Depressive phase Alcohol/Substance Abuse/Dependencies   Musculoskeletal: Strength & Muscle Tone: within normal limits Gait & Station: normal Patient leans: N/A  Psychiatric Specialty Exam: I reviewed physical exam performed in the emergency room and agree with the findings. Physical Exam  Nursing note and vitals reviewed.   Review of Systems  Musculoskeletal: Positive for joint pain.  All other systems reviewed and are negative.   Blood pressure 112/74, pulse 79, temperature 98.1 F (36.7 Perry), temperature source Oral, resp. rate 16, height 6\' 3"  (1.905 m), weight 99.791  kg (220 lb), SpO2 100 %.Body mass index is 27.5 kg/(m^2).  General Appearance: Casual  Eye Contact::  Good  Speech:  Clear and Coherent  Volume:  Normal  Mood:  Depressed, Hopeless and Worthless  Affect:  Flat  Thought Process:  Goal Directed  Orientation:  Full (Time, Place, and Person)  Thought Content:  WDL  Suicidal Thoughts:  Yes.  with intent/plan  Homicidal Thoughts:  No  Memory:  Immediate;   Fair Recent;   Fair Remote;   Fair  Judgement:  Impaired  Insight:  Shallow  Psychomotor Activity:  Normal  Concentration:  Fair  Recall:  [name]  Fund of Knowledge:Fair  Language: Fair  Akathisia:  No  Handed:  Right  AIMS (if indicated):     Assets:  Communication Skills Desire for Improvement Financial Resources/Insurance Physical Health Resilience Social Support  Sleep:  Number of Hours: 3.5  Cognition: WNL  ADL's:  Intact     COGNITIVE FEATURES THAT CONTRIBUTE TO RISK:  None    SUICIDE RISK:   Moderate:  Frequent suicidal ideation with limited intensity, and duration, some specificity in terms of plans, no associated intent, good self-control, limited dysphoria/symptomatology, some risk factors present, and identifiable protective factors, including available and accessible social support.  PLAN OF CARE: [name] admission, medication management, substance abuse counseling, discharge planning.  Medical Decision Making:  New problem, with additional work up planned, Review of Psycho-Social Stressors (1), Review or order clinical lab tests (1), Review of Medication Regimen & Side Effects (2) and Review of New Medication or Change in Dosage (2)   [name] is a 54 year old man with a history of bipolar disorder and substance  use admitted for suicidal ideation in the context of severe loss.  1. Suicidal ideation. The patient still suicidal but able to contract for safety in the [name].  2. Mood. We restarted Lamictal for mood stabilization and added Risperdal for  hallucinations.  3. Anxiety. He is on Vistaril.  4. Balanitis.  We will restart Valtrex, hydrocortisone cream and triple antibiotic.  5. Insomnia. We will restart Ambien.  6. Arthritic knee pain. We'll offer ibuprofen.  7. Alcohol abuse. The patient has been drinking recently to fight insomnia. We will monitor for symptoms of alcohol withdrawal.  8. Substance abuse treatment. In addition to alcohol the patient uses cocaine and marijuana. He is somewhat interested in residential treatment.  9. Disposition. He used to live with his brother but it may not be possible any longer. The patient is open to placement in a group home. He does not have income but does have Medicaid.  I certify that inpatient services furnished can reasonably be expected to improve the patient's condition.   [name] 03/19/2015, 1:40 PM

## 2015-03-19 NOTE — [facility] Counselor (Signed)
Paperwork has been placed on pt. chart. ER staff Olegario Messier( Kathy, ER Sect. And April, Patient's Nurse have received bed tracker.

## 2015-03-20 MED ORDER — HYDROXYZINE HCL 50 MG/ML IM SOLN
50.0000 mg | Freq: Every day | INTRAMUSCULAR | Status: DC
  Filled 2015-03-20 (×2): qty 1

## 2015-03-20 NOTE — Plan of Care (Signed)
Problem: Ineffective individual coping Goal: LTG: Patient will report a decrease in negative feelings Outcome: Progressing Patient verbalized decrease in negative feelings.  Goal: LTG-Other (Specify)- Outcome: Not Progressing Patient continues to need assistance with developing effective coping strategies.

## 2015-03-20 NOTE — Progress Notes (Signed)
Recreation Therapy Notes  Date: 10.25.16 Time: 3:00 pm Location: Craft Room  Group Topic: Goal Setting  Goal Area(s) Addresses:  Patient will write at least one goal. Patient will write at least one obstacle.  Behavioral Response: Did not attend  Intervention: Recovery Goal Chart  Activity: Patients were instructed to make a goal chart including goals towards their recovery, obstacles, the date they started working on their goal, and the date they achieved their goal.  Education: LRT educated patients on healthy ways they can celebrate reaching their goals.  Education Outcome: Patient did not attend group.   Clinical Observations/Feedback: Patient did not attend group.  Jacquelynn CreeGreene,Kasidee Voisin M, LRT/CTRS 03/20/2015 4:27 PM

## 2015-03-20 NOTE — Progress Notes (Signed)
Pt remain bed throughout the shift. Rates his depression level 9 (0low-10worst)   States feeling depressed with low energy. Clinical support provided. Med compliant. Denies SI/HI/AV/H noted. No behavior problems noted. Will continue to monitor for safety and behavior.

## 2015-03-20 NOTE — Progress Notes (Signed)
Patient was advised Chaplain was available for visit. Patient declined visit at this time.

## 2015-03-20 NOTE — Progress Notes (Signed)
[facility] MD Progress Note  03/20/2015 5:54 PM [name]  MRN:  [id number]  Subjective:  Mr. Eustice still feels depressed and suicidal but started to participate in discharge planning. He met with representatives of [facility] program and was accepted there. He will be discharged as soon as that is available and he is no longer suicidal. We restarted his medications and he tolerates them well. There are no somatic complaints. He participates in programming.  Principal Problem: Bipolar I disorder, most recent episode depressed, severe without psychotic features ([place]) Diagnosis:   Patient Active Problem List   Diagnosis Date Noted  . Anxiety [F41.9]   . Substance induced mood disorder ([place]) [F19.94] 12/08/2013  . Cocaine use disorder, moderate, dependence ([place]) [F14.20] 06/29/2012  . Alcohol use disorder, moderate, dependence ([place]) [F10.20] 06/29/2012  . Bipolar I disorder, most recent episode depressed, severe without psychotic features ([place]) [F31.4] 04/22/2012    Class: Acute   Total Time spent with patient: 20 minutes  Past Psychiatric History: Schizoaffective disorder.  Past Medical History:  Past Medical History  Diagnosis Date  . Depression   . Arthritis   . Bipolar 1 disorder ([place])   . PTSD (post-traumatic stress disorder)   . Schizophrenia ([place])   . Balanitis    History reviewed. No pertinent past surgical history. Family History: History reviewed. No pertinent family history. Family Psychiatric  History: None reported. Social History:  History  Alcohol Use  . 0.0 oz/week    Comment: 2 six packs/day     History  Drug Use  . Yes  . Special: Marijuana, Cocaine    Social History   Social History  . Marital Status: Single    Spouse Name: N/A  . Number of Children: N/A  . Years of Education: N/A   Social History Main Topics  . Smoking status: Former Smoker -- 0.00 packs/day    Types: Cigarettes    Quit date: 05/02/2014  . Smokeless tobacco: Never Used  .  Alcohol Use: 0.0 oz/week     Comment: 2 six packs/day  . Drug Use: Yes    Special: Marijuana, Cocaine  . Sexual Activity: Not Currently    Birth Control/ Protection: Condom   Other Topics Concern  . None   Social History Narrative   Additional Social History:    Pain Medications: see mar Prescriptions: see mar History of alcohol / drug use?: Yes Longest period of sobriety (when/how long): 1 yr states "last year" Negative Consequences of Use: Financial, Personal relationships, Work / School Name of Substance 1: crack 1 - Last Use / Amount: 1 yr ago Name of Substance 2: thc 2 - Age of First Use: 31 2 - Last Use / Amount: last year Name of Substance 3: etoh 3 - Age of First Use: 31 3 - Last Use / Amount: last year              Sleep: Fair  Appetite:  Fair  Current Medications: Current Facility-Administered Medications  Medication Dose Route Frequency Provider Last Rate Last Dose  . acetaminophen (TYLENOL) tablet 650 mg  650 mg Oral Q6H PRN Dewain Penning, MD      . alum & mag hydroxide-simeth (MAALOX/MYLANTA) 200-200-20 MG/5ML suspension 30 mL  30 mL Oral Q4H PRN Dewain Penning, MD      . hydrocortisone cream 1 %   Topical BID Jolanta B Pucilowska, MD      . hydrOXYzine (ATARAX/VISTARIL) tablet 25 mg  25 mg Oral TID PRN  Clovis Fredrickson, MD   25 mg at 03/19/15 1551  . ibuprofen (ADVIL,MOTRIN) tablet 600 mg  600 mg Oral TID Clovis Fredrickson, MD   600 mg at 03/20/15 1645  . lamoTRIgine (LAMICTAL) tablet 25 mg  25 mg Oral QHS Clovis Fredrickson, MD   25 mg at 03/19/15 2116  . magnesium hydroxide (MILK OF MAGNESIA) suspension 30 mL  30 mL Oral Daily PRN Dewain Penning, MD      . neomycin-bacitracin-polymyxin (NEOSPORIN) ointment   Topical BID Clovis Fredrickson, MD   1 application at 32/67/12 0955  . risperiDONE (RISPERDAL) tablet 2 mg  2 mg Oral BID Clovis Fredrickson, MD   2 mg at 03/20/15 0955  . valACYclovir (VALTREX) tablet 500 mg  500 mg Oral BID Clovis Fredrickson, MD   500 mg at 03/20/15 0955  . zolpidem (AMBIEN) tablet 10 mg  10 mg Oral QHS PRN Clovis Fredrickson, MD   10 mg at 03/19/15 2120    Lab Results:  Results for orders placed or performed during the hospital encounter of 03/19/15 (from the past 48 hour(s))  Rapid HIV screen (HIV 1/2 Ab+Ag)     Status: None   Collection Time: 03/19/15  6:44 AM  Result Value Ref Range   HIV-1 P24 Antigen - HIV24 NON REACTIVE NON REACTIVE   HIV 1/2 Antibodies NON REACTIVE NON REACTIVE   Interpretation (HIV Ag Ab)      A non reactive test result means that HIV 1 or HIV 2 antibodies and HIV 1 p24 antigen were not detected in the specimen.    Physical Findings: AIMS: Facial and Oral Movements Muscles of Facial Expression: None, normal Lips and Perioral Area: None, normal Jaw: None, normal Tongue: None, normal,Extremity Movements Upper (arms, wrists, hands, fingers): None, normal Lower (legs, knees, ankles, toes): None, normal, Trunk Movements Neck, shoulders, hips: None, normal, Overall Severity Severity of abnormal movements (highest score from questions above): None, normal Incapacitation due to abnormal movements: None, normal Patient's awareness of abnormal movements (rate only patient's report): No Awareness, Dental Status Current problems with teeth and/or dentures?: Yes (lost his dentures) Does patient usually wear dentures?: Yes  CIWA:  CIWA-Ar Total: 0 COWS:     Musculoskeletal: Strength & Muscle Tone: within normal limits Gait & Station: normal Patient leans: N/A  Psychiatric Specialty Exam: Review of Systems  Psychiatric/Behavioral: Positive for depression and suicidal ideas.  All other systems reviewed and are negative.   Blood pressure 118/75, pulse 76, temperature 97.5 F (36.4 C), temperature source Oral, resp. rate 16, height 6' 3"  (1.905 m), weight 99.791 kg (220 lb), SpO2 100 %.Body mass index is 27.5 kg/(m^2).  General Appearance: Casual  Eye Contact::  Good   Speech:  Clear and Coherent  Volume:  Normal  Mood:  Depressed  Affect:  Flat  Thought Process:  Goal Directed  Orientation:  Full (Time, Place, and Person)  Thought Content:  Hallucinations: Auditory  Suicidal Thoughts:  Yes.  with intent/plan  Homicidal Thoughts:  No  Memory:  Immediate;   Fair Recent;   Fair Remote;   Fair  Judgement:  Impaired  Insight:  Shallow  Psychomotor Activity:  Normal  Concentration:  Fair  Recall:  [place]  Language: Fair  Akathisia:  No  Handed:  Right  AIMS (if indicated):     Assets:  Communication Skills Desire for Improvement Financial Resources/Insurance Physical Health Resilience  ADL's:  Intact  Cognition: WNL  Sleep:  Number of Hours: 4.15   Treatment Plan Summary: Daily contact with patient to assess and evaluate symptoms and progress in treatment and Medication management   [name] is a 54 year old man with a history of bipolar disorder and substance use admitted for suicidal ideation in the context of severe loss.  1. Suicidal ideation. The patient still suicidal but able to contract for safety in the hospital.  2. Mood. We restarted Lamictal for mood stabilization and added Risperdal for hallucinations. He still depressed, suicidal, and hallucinating.  3. Anxiety. He is on Vistaril.  4. Balanitis. We will restart Valtrex, hydrocortisone cream and triple antibiotic.  5. Insomnia. We restarted Ambien but the patient slept 4 hours only. Will add trazodone.  6. Arthritic knee pain. We'll offer ibuprofen.  7. Alcohol abuse. The patient has been drinking recently to fight insomnia. We will monitor for symptoms of alcohol withdrawal.  8. Substance abuse treatment. In addition to alcohol the patient uses cocaine and marijuana. He is somewhat interested in residential treatment.  9. Disposition. He will be discharged to [facility]. He will follow up with [facility].    Jolanta  Pucilowska 03/20/2015, 5:54 PM

## 2015-03-20 NOTE — Progress Notes (Signed)
Patient alert oriented x3 calm, cooperative and medication compliant during the shift. Patient denies any SI/HI/AVH during the shift. Patient isolated to his room during most of the shift.

## 2015-03-20 NOTE — Plan of Care (Signed)
Problem: [facility] Participation in Recreation Therapeutic Interventions Goal: STG-Patient will demonstrate improved self esteem by identif STG: Self-Esteem - Within 4 treatment sessions, patient will verbalize at least 5 positive affirmation statements in each of 2 treatment sessions to increase self-esteem post d/c.  Outcome: Progressing Treatment Session 1; Completed 1 out of 2: At approximately 11:35 am, LRT met with patient in patient room. Patient verbalized 5 positive affirmation statements. Patient reported it felt "pretty nice". LRT encouraged patient to continue saying positive affirmation statements.  Leonette Monarch, LRT/CTRS 10.25.16 1:30 pm Goal: STG-Patient will identify at least five coping skills for ** STG: Coping Skills - Within 4 treatment sessions, patient will verbalize at least 5 coping skills for anger in each of 2 treatment sessions to increase anger management skills post d/c.  Outcome: Progressing Treatment Session 1; Completed 1 out of 2; At approximately 11:35 am, LRT met with patient in patient room. Patient verbalized 5 coping skills for anger. Patient verbalized what triggers him to get angry, how his body responds to anger, and how he is going to remember to use his healthy coping skills. LRT provided suggestions as well.  Leonette Monarch, LRT/CTRS 10.25.16 1:31 pm Goal: STG-Other Recreation Therapy Goal (Specify) STG; Stress Management - Within 4 treatment sessions, patient will verbalize understanding of the stress management techniques in each of 2 treatment sessions to increase stress management skills post d/c.  Outcome: Progressing Treatment Session 1; Completed 1 out of 2: At approximately 11:35 am, LRT met with patient in patient room. LRT educated and provided patient with handouts on stress management techniques. Patient verbalized understanding. LRT encouraged patient to read over and practice the stress management techniques.  Leonette Monarch,  LRT/CTRS 10.25.16 1:32 pm

## 2015-03-21 MED ORDER — HYDROXYZINE HCL 50 MG PO TABS
50.0000 mg | ORAL_TABLET | Freq: Every day | ORAL | Status: DC
  Administered 2015-03-21 – 2015-03-25 (×5): 50 mg via ORAL
  Filled 2015-03-21 (×5): qty 1

## 2015-03-21 MED ORDER — LAMOTRIGINE 25 MG PO TABS
50.0000 mg | ORAL_TABLET | Freq: Every day | ORAL | Status: DC
  Administered 2015-03-21 – 2015-03-25 (×5): 50 mg via ORAL
  Filled 2015-03-21 (×5): qty 2

## 2015-03-21 MED ORDER — RISPERIDONE 1 MG PO TABS
1.0000 mg | ORAL_TABLET | Freq: Two times a day (BID) | ORAL | Status: DC
  Administered 2015-03-21 – 2015-03-23 (×4): 1 mg via ORAL
  Filled 2015-03-21 (×5): qty 1

## 2015-03-21 NOTE — Plan of Care (Signed)
Problem: [facility] Participation in Recreation Therapeutic Interventions Goal: STG-Patient will demonstrate improved self esteem by identif STG: Self-Esteem - Within 4 treatment sessions, patient will verbalize at least 5 positive affirmation statements in each of 2 treatment sessions to increase self-esteem post d/c.  Outcome: Completed/Met Date Met:  03/21/15 Treatment Session 2; Completed 2 out of 2: At approximately 12:55 pm, LRT met with patient in patient room. Patient verbalized 5 positive affirmation statements. Patient reported he "felt like old self". LRT encouraged patient to continue saying positive affirmation statements.  Leonette Monarch, LRT/CTRS 10.26.16 4:58 pm Goal: STG-Patient will identify at least five coping skills for ** STG: Coping Skills - Within 4 treatment sessions, patient will verbalize at least 5 coping skills for anger in each of 2 treatment sessions to increase anger management skills post d/c.  Outcome: Completed/Met Date Met:  03/21/15 Treatment Session 2; Completed 2 out of 2: At approximately 12:55 pm, LRT met with patient in patient room. Patient verbalized 5 coping skills for anger. LRT encouraged patient to use coping skills when he is angry to help calm himself down.  Leonette Monarch, LRT/CTRS 10.26.16 4:59 pm Goal: STG-Other Recreation Therapy Goal (Specify) STG; Stress Management - Within 4 treatment sessions, patient will verbalize understanding of the stress management techniques in each of 2 treatment sessions to increase stress management skills post d/c.  Outcome: Completed/Met Date Met:  03/21/15 Treatment Session 2; Completed 2 out of 2: At approximately 12:55 pm, LRT met with patient in patient room. Patient reported he read over the stress management techniques. Patient verbalized understanding. LRT encouraged patient to practice the stress management techniques.  Leonette Monarch, LRT/CTRS 10.26.16 5:01 pm

## 2015-03-21 NOTE — Progress Notes (Signed)
[facility] MD Progress Note  03/21/2015 12:29 PM [name]  MRN:  [id number]  Subjective:  [name] still feels depressed, suicidal and hopeless. This is in spite of the meeting with [facility] representative promising him a spot in the program. He also reports paranoia and auditory hallucinations. In spite of psychotic symptoms he is asking to decrease the Risperdal dose as he feels it makes him sick. He did not sleep well last night and feels tired. He has not been able to participate in groups. He reports that with great difficulty she took a shower today. This is due to very poor energy from depression.  Principal Problem: Bipolar I disorder, most recent episode depressed, severe without psychotic features ([facility]) Diagnosis:   Patient Active Problem List   Diagnosis Date Noted  . Anxiety [F41.9]   . Substance induced mood disorder ([facility]) [F19.94] 12/08/2013  . Cocaine use disorder, moderate, dependence ([facility]) [F14.20] 06/29/2012  . Alcohol use disorder, moderate, dependence ([facility]) [F10.20] 06/29/2012  . Bipolar I disorder, most recent episode depressed, severe without psychotic features ([facility]) [F31.4] 04/22/2012    Class: Acute   Total Time spent with patient: 20 minutes  Past Psychiatric History: Mood instability and substance use.  Past Medical History:  Past Medical History  Diagnosis Date  . Depression   . Arthritis   . Bipolar 1 disorder ([facility])   . PTSD (post-traumatic stress disorder)   . Schizophrenia ([facility])   . Balanitis    History reviewed. No pertinent past surgical history. Family History: History reviewed. No pertinent family history. Family Psychiatric  History: None reported. Social History:  History  Alcohol Use  . 0.0 oz/week    Comment: 2 six packs/day     History  Drug Use  . Yes  . Special: Marijuana, Cocaine    Social History   Social History  . Marital Status: Single    Spouse Name: N/A  . Number of Children: N/A  . Years of Education: N/A    Social History Main Topics  . Smoking status: Former Smoker -- 0.00 packs/day    Types: Cigarettes    Quit date: 05/02/2014  . Smokeless tobacco: Never Used  . Alcohol Use: 0.0 oz/week     Comment: 2 six packs/day  . Drug Use: Yes    Special: Marijuana, Cocaine  . Sexual Activity: Not Currently    Birth Control/ Protection: Condom   Other Topics Concern  . None   Social History Narrative   Additional Social History:    Pain Medications: see mar Prescriptions: see mar History of alcohol / drug use?: Yes Longest period of sobriety (when/how long): 1 yr states "last year" Negative Consequences of Use: Financial, Personal relationships, Work / School Name of Substance 1: crack 1 - Last Use / Amount: 1 yr ago Name of Substance 2: thc 2 - Age of First Use: 31 2 - Last Use / Amount: last year Name of Substance 3: etoh 3 - Age of First Use: 31 3 - Last Use / Amount: last year              Sleep: Poor  Appetite:  Good  Current Medications: Current Facility-Administered Medications  Medication Dose Route Frequency Provider Last Rate Last Dose  . acetaminophen (TYLENOL) tablet 650 mg  650 mg Oral Q6H PRN Beau Fanny, MD      . alum & mag hydroxide-simeth (MAALOX/MYLANTA) 200-200-20 MG/5ML suspension 30 mL  30 mL Oral Q4H PRN Beau Fanny, MD      .  hydrocortisone cream 1 %   Topical BID Braelon Sprung B Barbera Perritt, MD      . hydrOXYzine (ATARAX/VISTARIL) tablet 25 mg  25 mg Oral TID PRN Shari Prows, MD   25 mg at 03/20/15 2139  . hydrOXYzine (ATARAX/VISTARIL) tablet 50 mg  50 mg Oral QHS Micca Matura B Abdikadir Fohl, MD      . ibuprofen (ADVIL,MOTRIN) tablet 600 mg  600 mg Oral TID Shari Prows, MD   600 mg at 03/21/15 0924  . lamoTRIgine (LAMICTAL) tablet 50 mg  50 mg Oral QHS Kasey Ewings B Samiya Mervin, MD      . magnesium hydroxide (MILK OF MAGNESIA) suspension 30 mL  30 mL Oral Daily PRN Beau Fanny, MD      . neomycin-bacitracin-polymyxin (NEOSPORIN) ointment    Topical BID Shari Prows, MD   1 application at 03/20/15 0955  . risperiDONE (RISPERDAL) tablet 1 mg  1 mg Oral BID Janziel Hockett B Elie Leppo, MD      . valACYclovir (VALTREX) tablet 500 mg  500 mg Oral BID Lakresha Stifter B Ryder Man, MD   500 mg at 03/21/15 0924  . zolpidem (AMBIEN) tablet 10 mg  10 mg Oral QHS PRN Shari Prows, MD   10 mg at 03/20/15 2144    Lab Results: No results found for this or any previous visit (from the past 48 hour(s)).  Physical Findings: AIMS: Facial and Oral Movements Muscles of Facial Expression: None, normal Lips and Perioral Area: None, normal Jaw: None, normal Tongue: None, normal,Extremity Movements Upper (arms, wrists, hands, fingers): None, normal Lower (legs, knees, ankles, toes): None, normal, Trunk Movements Neck, shoulders, hips: None, normal, Overall Severity Severity of abnormal movements (highest score from questions above): None, normal Incapacitation due to abnormal movements: None, normal Patient's awareness of abnormal movements (rate only patient's report): No Awareness, Dental Status Current problems with teeth and/or dentures?: Yes (lost his dentures) Does patient usually wear dentures?: Yes  CIWA:  CIWA-Ar Total: 0 COWS:     Musculoskeletal: Strength & Muscle Tone: within normal limits Gait & Station: normal Patient leans: N/A  Psychiatric Specialty Exam: Review of Systems  Constitutional: Positive for malaise/fatigue.  Psychiatric/Behavioral: Positive for depression, suicidal ideas, hallucinations and substance abuse.  All other systems reviewed and are negative.   Blood pressure 101/68, pulse 78, temperature 98.2 F (36.8 C), temperature source Oral, resp. rate 16, height  (1.905 m), weight 99.791 kg (220 lb), SpO2 100 %.Body mass index is 27.5 kg/(m^2).  General Appearance: Casual  Eye Contact::  Good  Speech:  Clear and Coherent  Volume:  Normal  Mood:  Depressed and Hopeless  Affect:  Flat  Thought  Process:  Goal Directed  Orientation:  Full (Time, Place, and Person)  Thought Content:  Delusions, Hallucinations: Auditory and Paranoid Ideation  Suicidal Thoughts:  Yes.  with intent/plan  Homicidal Thoughts:  No  Memory:  Immediate;   Fair Recent;   Fair Remote;   Fair  Judgement:  Impaired  Insight:  Shallow  Psychomotor Activity:  Normal  Concentration:  Fair  Recall:  [employer] of Knowledge:Fair  Language: Fair  Akathisia:  No  Handed:  Right  AIMS (if indicated):     Assets:  Communication Skills Desire for Improvement Physical Health Resilience Social Support  ADL's:  Intact  Cognition: WNL  Sleep:  Number of Hours: 7.45   Treatment Plan Summary: Daily contact with patient to assess and evaluate symptoms and progress in treatment and Medication management   Mr.  Page is a 54 year old man with a history of bipolar disorder and substance use admitted for suicidal ideation in the context of severe loss.  1. Suicidal ideation. The patient still suicidal but able to contract for safety in the hospital.  2. Mood. We restarted Lamictal for mood stabilization and added Risperdal for hallucinations. He still depressed, suicidal, and hallucinating. We will lower Risperdal to 1 mg bid and increase lamictal to 50 mg.  3. Anxiety. He is on Vistaril.  4. Balanitis. We will restart Valtrex, hydrocortisone cream and triple antibiotic.  5. Insomnia. We restarted Ambien but the patient slept 4 hours only. Will add vistaril.   6. Arthritic knee pain. We'll offer ibuprofen.  7. Alcohol abuse. The patient has been drinking recently to fight insomnia. We will monitor for symptoms of alcohol withdrawal.  8. Substance abuse treatment. In addition to alcohol the patient uses cocaine and marijuana. He is somewhat interested in residential treatment.  9. Disposition. He will be discharged to [facility]. He will follow up with [facility].    Camiah Humm 03/21/2015,  12:29 PM

## 2015-03-21 NOTE — Progress Notes (Signed)
Patient alert oriented x3, denies SI/HI/AVH but has feelings of hopelessness. Patient endorsed he feels safe here. Patient spent most of the shift isolated in his room. Patient only came out for meds and meals and did not attend any groups during the day.
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
## 2015-03-21 NOTE — [facility] Group Notes (Signed)
[facility] Group Notes:  (Nursing/MHT/Case Management/Adjunct)  Date:  03/21/2015  Time:  2:11 PM  Type of Therapy:  Psychoeducational Skills  Participation Level:  Did Not Attend   Lynelle SmokeCara Travis [facility] 03/21/2015, 2:11 PM

## 2015-03-21 NOTE — Progress Notes (Signed)
Calm and cooperative. OOB to DR watching tv. Limited interaction with peers and staff. Med compliant. Refused Vistaril 50 IM. States he don't like needles. Vistaril 25 mg po given at 2139 anxiety and Ambien 10 mg po give for sleep PRN. Denies SI/HI/AV/H noted. Slept 7.45 hours.
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
## 2015-03-21 NOTE — [facility] Group Notes (Signed)
[facility] LCSW Group Therapy  03/21/2015 2:46 PM  Type of Therapy:  Group Therapy  Participation Level:  Did Not Attend   Tripp Goins T, MSW, LCSWA 03/21/2015, 2:46 PM  

## 2015-03-21 NOTE — Progress Notes (Signed)
Recreation Therapy Notes  Date: 10.26.16 Time: 3:00 pm Location: Craft Room  Group Topic: Self-esteem  Goal Area(s) Addresses:  Patient will write at least one positive trait. Patient will verbalize benefit of having a healthy self-esteem.  Behavioral Response: Did not attend  Intervention: I Am  Activity: Patients were given a worksheet with the letter I on it and instructed to fill the letter with as many positive traits about themselves as they could.  Education:LRT educated patients on ways they can increase their self-esteem.   Education Outcome: Patient did not attend group.   Clinical Observations/Feedback: Patient did not attend group.  Jacquelynn CreeGreene,Arabelle Bollig M, LRT/CTRS 03/21/2015 4:35 PM

## 2015-03-21 NOTE — [facility] Group Notes (Signed)
[facility]   03/21/2015 11:33 AM  Patient did not attend.  Jenel LucksJasmine Lewis, Clinical Social Work Intern 03/21/15  Beryl MeagerJason Aireona Torelli, MSW,  Theresia MajorsLCSWA  03/21/15

## 2015-03-22 NOTE — Progress Notes (Signed)
D: Pt denies SI/HI/AVH. Pt is pleasant and cooperative. Affect is flat and sad but brightens upon approach  Pt expressed concern over insomnia, he was reassured that the HCP prescribed medication PRN that can help, Patient appears less anxious and he is interacting with peers and staff appropriately.  A: Pt was offered support and encouragement. Pt was given scheduled medications. Pt was encouraged to attend groups. Q 15 minute checks were done for safety.  R:Pt attends groups and interacts appropriately  with peers and staff. Pt is compliant with medication. Patient is  receptive to treatment and safety maintained on unit.

## 2015-03-22 NOTE — Plan of Care (Signed)
Problem: Alteration in mood Goal: LTG-Patient reports reduction in suicidal thoughts (Patient reports reduction in suicidal thoughts and is able to verbalize a safety plan for whenever patient is feeling suicidal)  Outcome: Progressing Patient denies SI/HI/AVH.

## 2015-03-22 NOTE — Progress Notes (Signed)
Recreation Therapy Notes  Date: 10.27.16 Time: 3:15 pm Location: Craft Room  Group Topic: Leisure Education  Goal Area(s) Addresses:  Patient will identify activities for each letter of the alphabet. Patient will verbalize ability to integrate positive leisure into life post d/c. Patient will verbalize ability to use leisure as a Associate Professorcoping skill.  Behavioral Response: Attentive, Interactive  Intervention: Leisure Alphabet  Activity: Patients were given a Leisure Information systems managerAlphabet worksheet and instructed to think of healthy leisure activities for each letter of the alphabet.  Education: LRT educated patients on what they need to participate in leisure.  Education Outcome: Acknowledges education/In group clarification offered  Clinical Observations/Feedback: Patient completed activity by writing healthy leisure activities down. Patient contributed to group discussion by stating some of the healthy leisure activities he wrote down, what is needed to participate in leisure, and how leisure can be used as a Associate Professorcoping skill.  Jacquelynn CreeGreene,Eathon Valade M, LRT/CTRS 03/22/2015 4:28 PM

## 2015-03-22 NOTE — Progress Notes (Signed)
[facility]  03/22/2015 4:41 PM Paulla DollyVincent Y [name]  MRN:  [id number]  Subjective:  [name] still feels depressed and suicidal but is trying to make a good effort. He took a shower again. He went to groups. She slept well today with a combination of Ambien and Vistaril. He felt rested. He tolerates medications well. There are no symptoms of withdrawal. [facility] as [facility] do not take patients with dual diagnosis. They believe that he has higher level of care.   Principal Problem: Bipolar I disorder, most recent episode depressed, severe without psychotic features (HCC) Diagnosis:   Patient Active Problem List   Diagnosis Date Noted  . Anxiety [F41.9]   . Substance induced mood disorder (HCC) [F19.94] 12/08/2013  . Cocaine use disorder, moderate, dependence (HCC) [F14.20] 06/29/2012  . Alcohol use disorder, moderate, dependence (HCC) [F10.20] 06/29/2012  . Bipolar I disorder, most recent episode depressed, severe without psychotic features (HCC) [F31.4] 04/22/2012    Class: Acute   Total Time spent with patient: 20 minutes  Past Psychiatric History: Bipolar disorder.  Past Medical History:  Past Medical History  Diagnosis Date  . Depression   . Arthritis   . Bipolar 1 disorder (HCC)   . PTSD (post-traumatic stress disorder)   . Schizophrenia (HCC)   . Balanitis    History reviewed. No pertinent past surgical history. Family History: History reviewed. No pertinent family history. Family Psychiatric  History: None reported. Social History:  History  Alcohol Use  . 0.0 oz/week    Comment: 2 six packs/day     History  Drug Use  . Yes  . Special: Marijuana, Cocaine    Social History   Social History  . Marital Status: Single    Spouse Name: N/A  . Number of Children: N/A  . Years of Education: N/A   Social History Main Topics  . Smoking status: Former Smoker -- 0.00 packs/day    Types: Cigarettes    Quit date: 05/02/2014  .  Smokeless tobacco: Never Used  . Alcohol Use: 0.0 oz/week     Comment: 2 six packs/day  . Drug Use: Yes    Special: Marijuana, Cocaine  . Sexual Activity: Not Currently    Birth Control/ Protection: Condom   Other Topics Concern  . None   Social History Narrative   Additional Social History:    Pain Medications: see mar Prescriptions: see mar History of alcohol / drug use?: Yes Longest period of sobriety (when/how long): 1 yr states "last year" Negative Consequences of Use: Financial, Personal relationships, Work / School Name of Substance 1: crack 1 - Last Use / Amount: 1 yr ago Name of Substance 2: thc 2 - Age of First Use: 31 2 - Last Use / Amount: last year Name of Substance 3: etoh 3 - Age of First Use: 31 3 - Last Use / Amount: last year              Sleep: Good  Appetite:  Good  Current Medications: Current Facility-Administered Medications  Medication Dose Route Frequency Provider Last Rate Last Dose  . acetaminophen (TYLENOL) tablet 650 mg  650 mg Oral Q6H PRN Beau FannySurya K Challa, MD      . alum & mag hydroxide-simeth (MAALOX/MYLANTA) 200-200-20 MG/5ML suspension 30 mL  30 mL Oral Q4H PRN Beau FannySurya K Challa, MD      . hydrocortisone cream 1 %   Topical BID Shari ProwsJolanta B Arrianna Catala, MD      .  hydrOXYzine (ATARAX/VISTARIL) tablet 25 mg  25 mg Oral TID PRN Shari Prows, MD   25 mg at 03/20/15 2139  . hydrOXYzine (ATARAX/VISTARIL) tablet 50 mg  50 mg Oral QHS Shari Prows, MD   50 mg at 03/21/15 2148  . ibuprofen (ADVIL,MOTRIN) tablet 600 mg  600 mg Oral TID Shari Prows, MD   600 mg at 03/22/15 1622  . lamoTRIgine (LAMICTAL) tablet 50 mg  50 mg Oral QHS Shari Prows, MD   50 mg at 03/21/15 2148  . magnesium hydroxide (MILK OF MAGNESIA) suspension 30 mL  30 mL Oral Daily PRN Beau Fanny, MD      . neomycin-bacitracin-polymyxin (NEOSPORIN) ointment   Topical BID Shari Prows, MD   1 application at 03/22/15 0929  . risperiDONE  (RISPERDAL) tablet 1 mg  1 mg Oral BID Shari Prows, MD   1 mg at 03/22/15 0929  . valACYclovir (VALTREX) tablet 500 mg  500 mg Oral BID Shari Prows, MD   500 mg at 03/22/15 0929  . zolpidem (AMBIEN) tablet 10 mg  10 mg Oral QHS PRN Shari Prows, MD   10 mg at 03/21/15 2148    Lab Results: No results found for this or any previous visit (from the past 48 hour(s)).  Physical Findings: AIMS: Facial and Oral Movements Muscles of Facial Expression: None, normal Lips and Perioral Area: None, normal Jaw: None, normal Tongue: None, normal,Extremity Movements Upper (arms, wrists, hands, fingers): None, normal Lower (legs, knees, ankles, toes): None, normal, Trunk Movements Neck, shoulders, hips: None, normal, Overall Severity Severity of abnormal movements (highest score from questions above): None, normal Incapacitation due to abnormal movements: None, normal Patient's awareness of abnormal movements (rate only patient's report): No Awareness, Dental Status Current problems with teeth and/or dentures?: Yes (lost his dentures) Does patient usually wear dentures?: Yes  CIWA:  CIWA-Ar Total: 0 COWS:     Musculoskeletal: Strength & Muscle Tone: within normal limits Gait & Station: normal Patient leans: N/A  Psychiatric Specialty Exam: Review of Systems  Psychiatric/Behavioral: Positive for depression and suicidal ideas.  All other systems reviewed and are negative.   Blood pressure 113/68, pulse 69, temperature 98.2 F (36.8 C), temperature source Oral, resp. rate 18, height  (1.905 m), weight 99.791 kg (220 lb), SpO2 100 %.Body mass index is 27.5 kg/(m^2).  General Appearance: Casual  Eye Contact::  Good  Speech:  Clear and Coherent  Volume:  Normal  Mood:  Depressed  Affect:  Flat  Thought Process:  Goal Directed  Orientation:  Full (Time, Place, and Person)  Thought Content:  WDL  Suicidal Thoughts:  Yes.  with intent/plan  Homicidal Thoughts:  No   Memory:  Immediate;   Fair Recent;   Fair Remote;   Fair  Judgement:  Impaired  Insight:  Shallow  Psychomotor Activity:  Decreased  Concentration:  Fair  Recall:  [employer] of Knowledge:Fair  Language: Fair  Akathisia:  No  Handed:  Right  AIMS (if indicated):     Assets:  Communication Skills Desire for Improvement Physical Health Resilience Social Support  ADL's:  Intact  Cognition: WNL  Sleep:  Number of Hours: 8   Treatment Plan Summary: Daily contact with patient to assess and evaluate symptoms and progress in treatment and Medication management   Mr. Shanon Rosser is a 54 year old man with a history of bipolar disorder and substance use admitted for suicidal ideation in the context of severe loss.  1. Suicidal ideation. The patient still suicidal but able to contract for safety in the hospital.  2. Mood. We restarted Lamictal for mood stabilization and added Risperdal for hallucinations. He still depressed, suicidal, and hallucinating. We will lower Risperdal to 1 mg bid and increase lamictal to 50 mg.  3. Anxiety. He is on Vistaril.  4. Balanitis. We will restart Valtrex, hydrocortisone cream and triple antibiotic.  5. Insomnia. We restarted Ambien and  Vistaril. He slept 8 hours.  6. Arthritic knee pain. We'll offer ibuprofen.  7. Alcohol abuse. The patient has been drinking recently to fight insomnia. We will monitor for symptoms of alcohol withdrawal.  8. Substance abuse treatment. In addition to alcohol the patient uses cocaine and marijuana. He is somewhat interested in residential treatment.  9. Disposition. He will be discharged to his brother and follow up with [facility].   No medication adjustments were made. The patient improved slowly. 03/22/2015    Yaron Grasse 03/22/2015, 4:41 PM

## 2015-03-22 NOTE — [facility] Group Notes (Signed)
[facility] Group Notes:  (Nursing/MHT/Case Management/Adjunct)  Date:  03/22/2015  Time:  2:14 PM  Type of Therapy:  Psychoeducational Skills  Participation Level:  Did Not Attend   Lynelle SmokeCara Travis [facility] Surgery CenterMadoni 03/22/2015, 2:14 PM

## 2015-03-22 NOTE — Plan of Care (Signed)
Problem: Spiritual Needs Goal: Ability to function at adequate level Outcome: Progressing Able to manage meals and med compliant  Problem: Diagnosis: Increased Risk For Suicide Attempt Goal: LTG-Patient Will Report Improved Mood and Deny Suicidal LTG (by discharge) Patient will report improved mood and deny suicidal ideation.  Outcome: Progressing Denies SI

## 2015-03-22 NOTE — Progress Notes (Signed)
Pleasant and cooperative with care. Med compliant. Out of room for meals. Minimal interaction with staff and peers. No negative behaviors. Denies SI,HI, AVH. Will continue to assess and monitor for safety.

## 2015-03-23 MED ORDER — RISPERIDONE 1 MG PO TABS
2.0000 mg | ORAL_TABLET | Freq: Two times a day (BID) | ORAL | Status: DC
  Administered 2015-03-23 – 2015-03-24 (×2): 2 mg via ORAL
  Administered 2015-03-24: 1 mg via ORAL
  Administered 2015-03-25 – 2015-03-26 (×3): 2 mg via ORAL
  Filled 2015-03-23 (×6): qty 2

## 2015-03-23 NOTE — Progress Notes (Signed)
Patient alert and oriented, patient has participated in groups, He is talkative about how He feels, and states He does feel better than when He first came in.

## 2015-03-23 NOTE — Progress Notes (Signed)
Patient oriented, denies suicidal ideation at this time, He went outside today, and states He enjoyed being outside, states He puts his trust in God and that He knows that things can get better, nurse talked to him about coping skills and tools to use when needed.He states that He is going to find a good church when He leaves and that His dad was a Optician, dispensingminister. Patient takes all medications without any hesitation. Patient is safe and no s/s of distress.
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
## 2015-03-23 NOTE — Plan of Care (Signed)
Problem: Alteration in mood Goal: LTG-Patient reports reduction in suicidal thoughts (Patient reports reduction in suicidal thoughts and is able to verbalize a safety plan for whenever patient is feeling suicidal)  Outcome: Progressing Patient denies SI/HI.      

## 2015-03-23 NOTE — Progress Notes (Signed)
Recreation Therapy Notes  Date: 10.28.16 Time: 3:00 pm Location: Craft Room  Group Topic: Communication, Problem Solving, Teamwork  Goal Area(s) Addresses:  Patient will effectively work with peers towards shared goal. Patient will identify skills used to make activity successful. Patient will identify benefit of using group skills effectively post d/c.  Behavioral Response: Attentive, Interactive  Intervention: Berkshire HathawayPipe Cleaner Tower  Activity: Patients were given 15 pipe cleanser and instructed to build the tallest free standing tower. Patients were given 2 minutes to strategize. After approximately 5 minutes of building, patients were instructed to put their dominant hand behind their back. After approximately 3 minutes of building, patients were instructed to stop talking to each other.    Education: LRT educated patients on how communication, problem solving, and teamwork goes in to building a healthy support system.  Education Outcome: Acknowledges education/In group clarification offered  Clinical Observations/Feedback: Patient worked with team to build tower. Patient used communication, problem solving and team work. Patient contributed to group discussion by stating how his team worked together effectively, how they used effective communication, problem solving, and teamwork, how these skills go into building a healthy support system, what prevents him from using these skills at home, and what would change for him if he used these skills.  Jacquelynn CreeGreene,Dorette Hartel M, LRT/CTRS 03/23/2015 4:31 PM

## 2015-03-23 NOTE — [facility] Group Notes (Signed)
[facility] Group Notes:  (Nursing/MHT/Case Management/Adjunct)  Date:  03/23/2015  Time:  10:34 PM  Type of Therapy:  Evening Wrap-up Group  Participation Level:  Minimal  Participation Quality:  Appropriate  Affect:  Flat  Cognitive:  Alert  Insight:  Limited  Engagement in Group:  Developing/Improving  Modes of Intervention:  Activity  Summary of Progress/Problems:  Perry MorrowChelsea [name] [name] 03/23/2015, 10:34 PM

## 2015-03-23 NOTE — Tx Team (Signed)
Interdisciplinary Treatment Plan Update (Adult)  Date:  03/23/2015 Time Reviewed:  2:52 PM  Progress in Treatment: Attending groups: Yes. Participating in groups:  Yes. Taking medication as prescribed:  Yes. Tolerating medication:  Yes. Family/Significant othe contact made:  No, will contact:  Brother Patient understands diagnosis:  Yes. Discussing patient identified problems/goals with staff:  Yes. Medical problems stabilized or resolved:  Yes. Denies suicidal/homicidal ideation: Yes. Issues/concerns per patient self-inventory:  Yes. Other:  New problem(s) identified: No, Describe:  NA  Discharge Plan or Barriers: Pt plans to stay with brother and follow up with outpatient.   Reason for Continuation of Hospitalization: Depression Medication stabilization Suicidal ideation  Comments:[name] still feels depressed and suicidal but is trying to make a good effort. He took a shower again. He went to groups. She slept well today with a combination of Ambien and Vistaril. He felt rested. He tolerates medications well. There are no symptoms of withdrawal. He will not be accepted to [facility] as they do not take patients with dual diagnosis. They believe that he has higher level of care.   Estimated length of stay: Pt will likely d/c on Monday   New goal(s): NA  Review of initial/current patient goals per problem list:   1.  Goal(s): Patient will participate in aftercare plan * Met:  * Target date: at discharge * As evidenced by: Patient will participate within aftercare plan AEB aftercare provider and housing plan at discharge being identified.   2.  Goal (s): Patient will exhibit decreased depressive symptoms and suicidal ideations. * Met:  *  Target date: at discharge * As evidenced by: Patient will utilize self rating of depression at 3 or below and demonstrate decreased signs of depression or be deemed stable for discharge by MD.   3.  Goal(s): Patient will  demonstrate decreased signs and symptoms of anxiety. * Met:  * Target date: at discharge * As evidenced by: Patient will utilize self rating of anxiety at 3 or below and demonstrated decreased signs of anxiety, or be deemed stable for discharge by MD   4.  Goal(s): Patient will demonstrate decreased signs of withdrawal due to substance abuse * Met:  * Target date: at discharge * As evidenced by: Patient will produce a CIWA/COWS score of 0, have stable vitals signs, and no symptoms of withdrawal.  5.  Goal (s): Patient will demonstrate decreased symptoms of psychosis. * Met: No  *  Target date: at discharge * As evidenced by: Patient will not endorse signs of psychosis or be deemed stable for discharge by MD.   Attendees: Patient:  [name] 10/28/20162:52 PM  Family:   10/28/20162:52 PM  Physician:   Dr. Bary Leriche  10/28/20162:52 PM  Nursing:   Marcie Bal, RN  10/28/20162:52 PM  Case Manager:   10/28/20162:52 PM  Counselor:   10/28/20162:52 PM  Other:  Flippin, Zoar 10/28/20162:52 PM  Other:  Everitt Amber, Mason  10/28/20162:52 PM  Other:   10/28/20162:52 PM  Other:  10/28/20162:52 PM  Other:  10/28/20162:52 PM  Other:  10/28/20162:52 PM  Other:  10/28/20162:52 PM  Other:  10/28/20162:52 PM  Other:  10/28/20162:52 PM  Other:   10/28/20162:52 PM   Scribe for Treatment Team:   Wray Kearns, MSW, LCSWA  03/23/2015, 2:52 PM

## 2015-03-23 NOTE — Progress Notes (Signed)
D: Pt denies SI/HI/AVH. Pt is pleasant and cooperative, affect flat and sad. Pt is interacting with peers and staff appropriately.  A: Pt was offered support and encouragement. Pt was given scheduled medications. Pt was encouraged to attend groups. Q 15 minute checks were done for safety.  R:Pt attends groups and interacts well with peers and staff. Pt is taking medication. Pt has no complaints.Pt receptive to treatment and safety maintained on unit.

## 2015-03-23 NOTE — Progress Notes (Signed)
[facility] MD Progress Note  03/23/2015 9:22 PM [name]  MRN:  [id number]  Subjective:  [name] has a history of bipolar and substance abuse admitted for suicidal ideation in the context o medication noncompliance and major loss. He was [place] [employer] [place] back fro [place] when he attended his grandmother's funeral and off medication, when he had a "meltdown" that cased passanfers to stop the train.  [name] is stil very depressed, anxious and despondent. He is still suicidal. He reports auditory hallucinations but reuses higher dose of Risperal. He is disappointed that he was rejected by [facility] house program. He is uncertain if he will be allowed to return to his brother's house. Not until he is "stable". He complains of interrupted sleep. There are no somatic complaints.  Principal Problem: Bipolar I disorder, most recent episode depressed, severe without psychotic features (HCC) Diagnosis:   Patient Active Problem List   Diagnosis Date Noted  . Anxiety [F41.9]   . Substance induced mood disorder (HCC) [F19.94] 12/08/2013  . Cocaine use disorder, moderate, dependence (HCC) [F14.20] 06/29/2012  . Alcohol use disorder, moderate, dependence (HCC) [F10.20] 06/29/2012  . Bipolar I disorder, most recent episode depressed, severe without psychotic features (HCC) [F31.4] 04/22/2012    Class: Acute   Total Time spent with patient: 20 minutes  Past Psychiatric History: bipolar disordr and substance abuse.  Past Medical History:  Past Medical History  Diagnosis Date  . Depression   . Arthritis   . Bipolar 1 disorder (HCC)   . PTSD (post-traumatic stress disorder)   . Schizophrenia (HCC)   . Balanitis    History reviewed. No pertinent past surgical history. Family History: History reviewed. No pertinent family history. Family Psychiatric  History: none reportd.  Social History:  History  Alcohol Use  . 0.0 oz/week    Comment: 2 six packs/day     History  Drug Use  . Yes  .  Special: Marijuana, Cocaine    Social History   Social History  . Marital Status: Single    Spouse Name: N/A  . Number of Children: N/A  . Years of Education: N/A   Social History Main Topics  . Smoking status: Former Smoker -- 0.00 packs/day    Types: Cigarettes    Quit date: 05/02/2014  . Smokeless tobacco: Never Used  . Alcohol Use: 0.0 oz/week     Comment: 2 six packs/day  . Drug Use: Yes    Special: Marijuana, Cocaine  . Sexual Activity: Not Currently    Birth Control/ Protection: Condom   Other Topics Concern  . None   Social History Narrative   Additional Social History:    Pain Medications: see mar Prescriptions: see mar History of alcohol / drug use?: Yes Longest period of sobriety (when/how long): 1 yr states "last year" Negative Consequences of Use: Financial, Personal relationships, Work / School Name of Substance 1: crack 1 - Last Use / Amount: 1 yr ago Name of Substance 2: thc 2 - Age of First Use: 31 2 - Last Use / Amount: last year Name of Substance 3: etoh 3 - Age of First Use: 31 3 - Last Use / Amount: last year              Sleep: Poor  Appetite:  Good  Current Medications: Current Facility-Administered Medications  Medication Dose Route Frequency Provider Last Rate Last Dose  . acetaminophen (TYLENOL) tablet 650 mg  650 mg Oral Q6H PRN Beau Fanny, MD      .  alum & mag hydroxide-simeth (MAALOX/MYLANTA) 200-200-20 MG/5ML suspension 30 mL  30 mL Oral Q4H PRN Beau Fanny, MD      . hydrocortisone cream 1 %   Topical BID Quinlee Sciarra B Evyn Kooyman, MD      . hydrOXYzine (ATARAX/VISTARIL) tablet 25 mg  25 mg Oral TID PRN Shari Prows, MD   25 mg at 03/20/15 2139  . hydrOXYzine (ATARAX/VISTARIL) tablet 50 mg  50 mg Oral QHS Shari Prows, MD   50 mg at 03/22/15 2123  . ibuprofen (ADVIL,MOTRIN) tablet 600 mg  600 mg Oral TID Shari Prows, MD   600 mg at 03/23/15 1612  . lamoTRIgine (LAMICTAL) tablet 50 mg  50 mg Oral  QHS Shari Prows, MD   50 mg at 03/22/15 2124  . magnesium hydroxide (MILK OF MAGNESIA) suspension 30 mL  30 mL Oral Daily PRN Beau Fanny, MD      . neomycin-bacitracin-polymyxin (NEOSPORIN) ointment   Topical BID Shari Prows, MD   1 application at 03/23/15 0937  . risperiDONE (RISPERDAL) tablet 1 mg  1 mg Oral BID Mable Dara B Dreonna Hussein, MD   1 mg at 03/23/15 0936  . valACYclovir (VALTREX) tablet 500 mg  500 mg Oral BID Shari Prows, MD   500 mg at 03/23/15 0936  . zolpidem (AMBIEN) tablet 10 mg  10 mg Oral QHS PRN Shari Prows, MD   10 mg at 03/22/15 2124    Lab Results: No results found for this or any previous visit (from the past 48 hour(s)).  Physical Findings: AIMS: Facial and Oral Movements Muscles of Facial Expression: None, normal Lips and Perioral Area: None, normal Jaw: None, normal Tongue: None, normal,Extremity Movements Upper (arms, wrists, hands, fingers): None, normal Lower (legs, knees, ankles, toes): None, normal, Trunk Movements Neck, shoulders, hips: None, normal, Overall Severity Severity of abnormal movements (highest score from questions above): None, normal Incapacitation due to abnormal movements: None, normal Patient's awareness of abnormal movements (rate only patient's report): No Awareness, Dental Status Current problems with teeth and/or dentures?: Yes (lost his dentures) Does patient usually wear dentures?: Yes  CIWA:  CIWA-Ar Total: 0 COWS:     Musculoskeletal: Strength & Muscle Tone: within normal limits Gait & Station: normal Patient leans: N/A  Psychiatric Specialty Exam: Review of Systems  All other systems reviewed and are negative.   Blood pressure 112/76, pulse 81, temperature 98 F (36.7 C), temperature source Oral, resp. rate 20, height  (1.905 m), weight 99.791 kg (220 lb), SpO2 100 %.Body mass index is 27.5 kg/(m^2).  General Appearance: Casual  Eye Contact::  Good  Speech:  Clear and Coherent   Volume:  Normal  Mood:  Anxious and Depressed  Affect:  Flat  Thought Process:  Goal Directed  Orientation:  Full (Time, Place, and Person)  Thought Content:  Hallucinations: Auditory  Suicidal Thoughts:  Yes.  with intent/plan  Homicidal Thoughts:  No  Memory:  Immediate;   Fair Recent;   Fair Remote;   Fair  Judgement:  Fair  Insight:  Shallow  Psychomotor Activity:  Decreased  Concentration:  Fair  Recall:  [employer] of Knowledge:Fair  Language: Fair  Akathisia:  No  Handed:  Right  AIMS (if indicated):     Assets:  Communication Skills Desire for Improvement Financial Resources/Insurance Physical Health Resilience Social Support  ADL's:  Intact  Cognition: WNL  Sleep:  Number of Hours: 8   Treatment Plan Summary: Daily contact  with patient to assess and evaluate symptoms and progress in treatment and Medication management   [name] is a 54 year old man with a history of bipolar disorder and substance use admitted for suicidal ideation in the context of severe loss.  1. Suicidal ideation. The patient still suicidal but able to contract for safety in the hospital.  2. Mood. We restarted Lamictal for mood stabilization and added Risperdal for hallucinations. He still depressed, suicidal, and hallucinating. We will increase Risperdal to 2 mg bid to address unrllenting hallucinations and continue Lamictal to 50 mg.  3. Anxiety. He is on Vistaril.  4. Balanitis. We will restart Valtrex, hydrocortisone cream and triple antibiotic.  5. Insomnia. We restarted Ambien and Vistaril. He slept 8 hours.  6. Arthritic knee pain. We'll offer ibuprofen.  7. Alcohol abuse. The patient has been drinking recently to fight insomnia. There are no symptoms of alcohol withdrawal. Vital signs are stable.  8. Substance abuse treatment. In addition to alcohol the patient uses cocaine and marijuana. He is no longer interested in residential treatment.  9. Disposition. He will be  discharged to his brother and [facility].   [name] 03/23/2015, 9:22 PM

## 2015-03-23 NOTE — [facility] Group Notes (Signed)
[facility]  03/23/2015 5:06 PM  Type of Therapy:  Group Therapy  Participation Level:  Did Not Attend   Lulu Ridingngle, Tryniti Laatsch T, MSW, LCSWA 03/23/2015, 5:06 PM

## 2015-03-24 NOTE — Progress Notes (Signed)
[facility]  03/24/2015 4:35 PM [name]  MRN:  [id number]  Subjective:  [name] has a history of bipolar and substance abuse admitted for suicidal ideation in the context o medication noncompliance and major loss. [place] was [place] [employer] [place] fro [place] when he attended his grandmother's funeral and off medication, when he had a "meltdown" that cased passanfers to stop the train.  She was retrieved from the day room watching a movie with other patients.  He did state he was sleeping better and he was happy about that. Later the only physical problem he has some pain from arthritis but states that the ibuprofen helps that.  Principal Problem: Bipolar I disorder, most recent episode depressed, severe without psychotic features (HCC) Diagnosis:   Patient Active Problem List   Diagnosis Date Noted  . Anxiety [F41.9]   . Substance induced mood disorder (HCC) [F19.94] 12/08/2013  . Cocaine use disorder, moderate, dependence (HCC) [F14.20] 06/29/2012  . Alcohol use disorder, moderate, dependence (HCC) [F10.20] 06/29/2012  . Bipolar I disorder, most recent episode depressed, severe without psychotic features (HCC) [F31.4] 04/22/2012    Class: Acute   Total Time spent with patient: 20 minutes  Past Psychiatric History: bipolar disordr and substance abuse.  Past Medical History:  Past Medical History  Diagnosis Date  . Depression   . Arthritis   . Bipolar 1 disorder (HCC)   . PTSD (post-traumatic stress disorder)   . Schizophrenia (HCC)   . Balanitis    History reviewed. No pertinent past surgical history. Family History: History reviewed. No pertinent family history. Family Psychiatric  History: none reportd.  Social History:  History  Alcohol Use  . 0.0 oz/week    Comment: 2 six packs/day     History  Drug Use  . Yes  . Special: Marijuana, Cocaine    Social History   Social History  . Marital Status: Single    Spouse Name: N/A  . Number of Children:  N/A  . Years of Education: N/A   Social History Main Topics  . Smoking status: Former Smoker -- 0.00 packs/day    Types: Cigarettes    Quit date: 05/02/2014  . Smokeless tobacco: Never Used  . Alcohol Use: 0.0 oz/week     Comment: 2 six packs/day  . Drug Use: Yes    Special: Marijuana, Cocaine  . Sexual Activity: Not Currently    Birth Control/ Protection: Condom   Other Topics Concern  . None   Social History Narrative   Additional Social History:    Pain Medications: see mar Prescriptions: see mar History of alcohol / drug use?: Yes Longest period of sobriety (when/how long): 1 yr states "last year" Negative Consequences of Use: Financial, Personal relationships, Work / School Name of Substance 1: crack 1 - Last Use / Amount: 1 yr ago Name of Substance 2: thc 2 - Age of First Use: 31 2 - Last Use / Amount: last year Name of Substance 3: etoh 3 - Age of First Use: 31 3 - Last Use / Amount: last year              Sleep: Poor  Appetite:  Good  Current Medications: Current Facility-Administered Medications  Medication Dose Route Frequency Provider Last Rate Last Dose  . acetaminophen (TYLENOL) tablet 650 mg  650 mg Oral Q6H PRN Beau FannySurya K Challa, MD      . alum & mag hydroxide-simeth (MAALOX/MYLANTA) 200-200-20 MG/5ML suspension 30 mL  30  mL Oral Q4H PRN Beau Fanny, MD      . hydrocortisone cream 1 %   Topical BID Jolanta B Pucilowska, MD      . hydrOXYzine (ATARAX/VISTARIL) tablet 25 mg  25 mg Oral TID PRN Shari Prows, MD   25 mg at 03/20/15 2139  . hydrOXYzine (ATARAX/VISTARIL) tablet 50 mg  50 mg Oral QHS Shari Prows, MD   50 mg at 03/23/15 2144  . ibuprofen (ADVIL,MOTRIN) tablet 600 mg  600 mg Oral TID Shari Prows, MD   600 mg at 03/24/15 1600  . lamoTRIgine (LAMICTAL) tablet 50 mg  50 mg Oral QHS Shari Prows, MD   50 mg at 03/23/15 2144  . magnesium hydroxide (MILK OF MAGNESIA) suspension 30 mL  30 mL Oral Daily PRN Beau Fanny, MD      . neomycin-bacitracin-polymyxin (NEOSPORIN) ointment   Topical BID Shari Prows, MD   2 application at 03/24/15 1006  . risperiDONE (RISPERDAL) tablet 2 mg  2 mg Oral BID Jolanta B Pucilowska, MD   1 mg at 03/24/15 1006  . valACYclovir (VALTREX) tablet 500 mg  500 mg Oral BID Shari Prows, MD   500 mg at 03/24/15 1006  . zolpidem (AMBIEN) tablet 10 mg  10 mg Oral QHS PRN Shari Prows, MD   10 mg at 03/23/15 2146    Lab Results: No results found for this or any previous visit (from the past 48 hour(s)).  Physical Findings: AIMS: Facial and Oral Movements Muscles of Facial Expression: None, normal Lips and Perioral Area: None, normal Jaw: None, normal Tongue: None, normal,Extremity Movements Upper (arms, wrists, hands, fingers): None, normal Lower (legs, knees, ankles, toes): None, normal, Trunk Movements Neck, shoulders, hips: None, normal, Overall Severity Severity of abnormal movements (highest score from questions above): None, normal Incapacitation due to abnormal movements: None, normal Patient's awareness of abnormal movements (rate only patient's report): No Awareness, Dental Status Current problems with teeth and/or dentures?: Yes (lost his dentures) Does patient usually wear dentures?: Yes  CIWA:  CIWA-Ar Total: 0 COWS:     Musculoskeletal: Strength & Muscle Tone: within normal limits Gait & Station: normal Patient leans: N/A  Psychiatric Specialty Exam: Review of Systems  All other systems reviewed and are negative.   Blood pressure 110/73, pulse 71, temperature 98.6 F (37 C), temperature source Oral, resp. rate 20, height  (1.905 m), weight 99.791 kg (220 lb), SpO2 100 %.Body mass index is 27.5 kg/(m^2).  General Appearance: Casual  Eye Contact::  Good  Speech:  Clear and Coherent  Volume:  Normal  Mood:  Anxious and Depressed  Affect:  Flat  Thought Process:  Goal Directed  Orientation:  Full (Time, Place, and  Person)  Thought Content:  Hallucinations: Auditory  Suicidal Thoughts:  Yes.  with intent/plan  Homicidal Thoughts:  No  Memory:  Immediate;   Fair Recent;   Fair Remote;   Fair  Judgement:  Fair  Insight:  Shallow  Psychomotor Activity:  Decreased  Concentration:  Fair  Recall:  [employer] of Knowledge:Fair  Language: Fair  Akathisia:  No  Handed:  Right  AIMS (if indicated):     Assets:  Communication Skills Desire for Improvement Financial Resources/Insurance Physical Health Resilience Social Support  ADL's:  Intact  Cognition: WNL  Sleep:  Number of Hours: 5.75   Treatment Plan Summary: Daily contact with patient to assess and evaluate symptoms and progress in treatment and  Medication management   [name] is a 54 year old man with a history of bipolar disorder and substance use admitted for suicidal ideation in the context of severe loss.  1. Suicidal ideation. The patient still suicidal but able to contract for safety in the hospital.  2. Mood. We restarted Lamictal for mood stabilization and added Risperdal for hallucinations. He still depressed, suicidal, and hallucinating. We will increase Risperdal to 2 mg bid to address unrllenting hallucinations and continue Lamictal to 50 mg.  3. Anxiety. He is on Vistaril.  4. Balanitis. We will restart Valtrex, hydrocortisone cream and triple antibiotic.  5. Insomnia. We restarted Ambien and Vistaril. He slept 8 hours.  6. Arthritic knee pain. We'll offer ibuprofen.  7. Alcohol abuse. The patient has been drinking recently to fight insomnia. There are no symptoms of alcohol withdrawal. Vital signs are stable.  8. Substance abuse treatment. In addition to alcohol the patient uses cocaine and marijuana. He is no longer interested in residential treatment.  9. Disposition. He will be discharged to his brother and follow up with [facility].   Wallace Going 03/24/2015, 4:35 PM

## 2015-03-24 NOTE — [facility] Group Notes (Signed)
[facility] LCSW Group Therapy  03/24/2015 2:48 PM  Type of Therapy:  Group Therapy  Participation Level:  Active  Participation Quality:  Attentive  Affect:  Appropriate  Cognitive:  Alert  Insight:  Improving  Engagement in Therapy:  Improving  Modes of Intervention:  Discussion, Education, Socialization and Support  Summary of Progress/Problems: Pt will identify unhealthy thoughts and how they impact their emotions and behavior. Pt will be encouraged to discuss these thoughts, emotions and behaviors with the group. [name] attended group and stayed the entire time. He discussed feeling guilty and angry due to his past experiences with his family. He states these thoughts and feelings trigger him to use substances.   Sempra EnergyCandace L Shantay Sonn MSW, LCSWA  03/24/2015, 2:48 PM

## 2015-03-24 NOTE — Plan of Care (Signed)
Problem: Alteration in mood Goal: LTG-Patient reports reduction in suicidal thoughts (Patient reports reduction in suicidal thoughts and is able to verbalize a safety plan for whenever patient is feeling suicidal)  Outcome: Progressing Patient denies SI/HI.      

## 2015-03-24 NOTE — Plan of Care (Signed)
Problem: Spiritual Needs Goal: Ability to function at adequate level Outcome: Progressing Patient attending to ADL's independently, and functioning well within the milieu and with his peers. Appropriate interaction.   Problem: Alteration in mood Goal: LTG-Patient reports reduction in suicidal thoughts (Patient reports reduction in suicidal thoughts and is able to verbalize a safety plan for whenever patient is feeling suicidal)  Outcome: Progressing Pt reports a reduction in SI, none current, contracts for safety.  Goal: LTG-Pt's behavior demonstrates decreased signs of depression (Patient's behavior demonstrates decreased signs of depression to the point the patient is safe to return home and continue treatment in an outpatient setting)  Outcome: Progressing Patient interacting with peers and willing to discuss feelings and issues when approached.

## 2015-03-24 NOTE — Progress Notes (Signed)
D: Patient denies SI/HI/AVH. Pt is pleasant and cooperative, affect is flat and sad but brightens upon approach. Patient appears less anxious and he is interacting with peers and staff appropriately.  A: Pt was offered support and encouragement. Pt was given scheduled medications. Pt was encouraged to attend groups. Q 15 minute checks were done for safety.  R:Pt attends groups and interacts well with peers and staff. Pt is taking medication. Pt has no complaints.Pt receptive to treatment and safety maintained on unit.

## 2015-03-25 NOTE — [facility] Group Notes (Signed)
[facility] LCSW Group Therapy  03/25/2015 6:43 PM  Type of Therapy:  Group Therapy  Participation Level:  Active  Participation Quality:  Attentive  Affect:  Appropriate  Cognitive:  Alert  Insight:  Improving  Engagement in Therapy:  Improving  Modes of Intervention:  Discussion, Education, Socialization and Support  Summary of Progress/Problems:Todays topic: Grudges  Patients will be encouraged to discuss their thoughts, feelings, and behaviors as to why one holds on to grudges and reasons why people have grudges. Patients will process the impact of grudges on their daily lives and identify thoughts and feelings related to holding grudges. Patients will identify feelings and thoughts related to what life would look like without grudges. Oswaldo DoneVincent attended group and stayed the entire time. He discussed grudges he holds against his family. He states he learning how to forgive himself and others. He believes the emotions caused by his past impacts his substance abuse.   Sempra EnergyCandace L Calise Dunckel MSW, LCSWA  03/25/2015, 6:43 PM
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
## 2015-03-25 NOTE — [facility] Group Notes (Signed)
[facility] Group Notes:  (Nursing/MHT/Case Management/Adjunct)  Date:  03/25/2015  Time:  9:37 AM  Type of Therapy:  Goal-Setting  Participation Level:  Active  Participation Quality:  Appropriate and Sharing  Affect:  Appropriate  Cognitive:  Appropriate  Insight:  Good  Engagement in Group:  Engaged  Modes of Intervention:  Discussion  Summary of Progress/Problems:  Isabelle CourseWhitney R Ciin Brazzel 03/25/2015, 9:37 AM

## 2015-03-25 NOTE — Progress Notes (Signed)
Pleasant and cooperative.  Visible in milieu although he states that he likes to stay in his room because it is warm.  Further states that his pain is always a 10 here because his arthritis acts up when his gets cold. Denies SI or AVH. Safety maintained.

## 2015-03-26 DIAGNOSIS — N481 Balanitis: Secondary | ICD-10-CM | POA: Diagnosis present

## 2015-03-26 MED ORDER — WHITE PETROLATUM GEL
Status: AC
  Administered 2015-03-26: 10:00:00
  Filled 2015-03-26: qty 5

## 2015-03-26 MED ORDER — VALACYCLOVIR HCL 500 MG PO TABS: 500.0000 mg | ORAL_TABLET | Freq: Two times a day (BID) | ORAL | Status: DC

## 2015-03-26 MED ORDER — HYDROXYZINE HCL 50 MG PO TABS
50.0000 mg | ORAL_TABLET | Freq: Every day | ORAL | Status: DC
Start: 2015-03-26 — End: 2016-05-09

## 2015-03-26 MED ORDER — VALACYCLOVIR HCL 500 MG PO TABS
500.0000 mg | ORAL_TABLET | Freq: Two times a day (BID) | ORAL | Status: DC
Start: 2015-03-26 — End: 2016-05-09

## 2015-03-26 MED ORDER — BACITRACIN-NEOMYCIN-POLYMYXIN 400-5-5000 EX OINT: TOPICAL_OINTMENT | Freq: Two times a day (BID) | CUTANEOUS | Status: DC

## 2015-03-26 MED ORDER — LAMOTRIGINE 25 MG PO TABS: 50.0000 mg | ORAL_TABLET | Freq: Every day | ORAL | Status: DC

## 2015-03-26 MED ORDER — RISPERIDONE 2 MG PO TABS: 2.0000 mg | ORAL_TABLET | Freq: Two times a day (BID) | ORAL | Status: DC

## 2015-03-26 MED ORDER — ZOLPIDEM TARTRATE 10 MG PO TABS: 10.0000 mg | ORAL_TABLET | Freq: Every evening | ORAL | Status: DC | PRN

## 2015-03-26 MED ORDER — HYDROXYZINE HCL 50 MG PO TABS: 50.0000 mg | ORAL_TABLET | Freq: Every day | ORAL | Status: DC

## 2015-03-26 MED ORDER — HYDROXYZINE HCL 25 MG PO TABS: 25.0000 mg | ORAL_TABLET | Freq: Three times a day (TID) | ORAL | Status: DC | PRN

## 2015-03-26 NOTE — Progress Notes (Signed)
D: Patient denies SI/HI/AVH.  Patient affect is appropriate and his mood is pleasant.  Patient states he is excited about discharge.  Patient did attend evening group. Patient visible on the milieu. No distress noted. A: Support and encouragement offered. Scheduled medications given to pt. Q 15 min checks continued for patient safety. R: Patient receptive. Patient remains safe on the unit.
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
## 2015-03-26 NOTE — [facility] Suicide Risk Assessment (Signed)
[facility]   Demographic Factors:  Male and Unemployed  Total Time spent with patient: 30 minutes  Musculoskeletal: Strength & Muscle Tone: within normal limits Gait & Station: normal Patient leans: N/A  Psychiatric Specialty Exam: Physical Exam  Nursing note and vitals reviewed.   Review of Systems  All other systems reviewed and are negative.   Blood pressure 129/78, pulse 80, temperature 98.4 F (36.9 C), temperature source Oral, resp. rate 20, height 6\' 3"  (1.905 m), weight 99.791 kg (220 lb), SpO2 100 %.Body mass index is 27.5 kg/(m^2).  General Appearance: Casual  Eye Contact::  Good  Speech:  Clear and Coherent409  Volume:  Normal  Mood:  Euthymic  Affect:  Appropriate  Thought Process:  Goal Directed  Orientation:  Full (Time, Place, and Person)  Thought Content:  WDL  Suicidal Thoughts:  No  Homicidal Thoughts:  No  Memory:  Immediate;   Fair Recent;   Fair Remote;   Fair  Judgement:  Fair  Insight:  Fair  Psychomotor Activity:  Normal  Concentration:  Fair  Recall:  [name]  Fund of Knowledge:Fair  Language: Fair  Akathisia:  No  Handed:  Right  AIMS (if indicated):     Assets:  Communication Skills Desire for Improvement Financial Resources/Insurance Housing Physical Health Resilience Social Support  Sleep:  Number of Hours: 6.5  Cognition: WNL  ADL's:  Intact   Have you used any form of tobacco in the last 30 days? (Cigarettes, Smokeless Tobacco, Cigars, and/or Pipes): No  Has this patient used any form of tobacco in the last 30 days? (Cigarettes, Smokeless Tobacco, Cigars, and/or Pipes) No  Mental Status Per Nursing Assessment::   On Admission:     Current Mental Status by Physician: NA  Loss Factors: Loss of significant relationship  Historical Factors: Prior suicide attempts and Impulsivity  Risk Reduction Factors:   Sense of responsibility to family, Living with another person, especially a relative,  Positive social support and Positive therapeutic relationship  Continued Clinical Symptoms:  Bipolar Disorder:   Depressive phase Alcohol/Substance Abuse/Dependencies  Cognitive Features That Contribute To Risk:  None    Suicide Risk:  Minimal: No identifiable suicidal ideation.  Patients presenting with no risk factors but with morbid ruminations; may be classified as minimal risk based on the severity of the depressive symptoms  Principal Problem: Bipolar I disorder, most recent episode depressed, severe without psychotic features [facility](HCC) Discharge Diagnoses:  Patient Active Problem List   Diagnosis Date Noted  . Balanitis [N48.1] 03/26/2015  . Anxiety [F41.9]   . Substance induced mood disorder (HCC) [F19.94] 12/08/2013  . Cocaine use disorder, moderate, dependence (HCC) [F14.20] 06/29/2012  . Alcohol use disorder, moderate, dependence (HCC) [F10.20] 06/29/2012  . Bipolar I disorder, most recent episode depressed, severe without psychotic features (HCC) [F31.4] 04/22/2012    Class: Acute      Plan Of Care/Follow-up recommendations:  Activity:  As tolerated. Diet:  Low sodium heart healthy. Other:  Keep follow-up appointments.  Is patient on multiple antipsychotic therapies at discharge:  No   Has Patient had three or more failed trials of antipsychotic monotherapy by history:  No  Recommended Plan for Multiple Antipsychotic Therapies: NA    Deny Chevez 03/26/2015, 10:13 AM

## 2015-03-26 NOTE — Progress Notes (Signed)
  [facility] :  Will you be returning to the same living situation after discharge:  No., [facility] At discharge, do you have transportation home?: No., will have to go by [employer]. Do you have the ability to pay for your medications: Yes,  Medicaid  Release of information consent forms completed and in the chart;  Patient's signature needed at discharge.  Patient to Follow up at:   Patient denies SI/HI: Yes,       Safety Planning and Suicide Prevention discussed: Yes,     Have you used any form of tobacco in the last 30 days? (Cigarettes, Smokeless Tobacco, Cigars, and/or Pipes): No  Has patient been referred to the Quitline?: N/A patient is not a smoker  Lilu Mcglown, Cleda DaubSara P, MSW, LCSW 03/26/2015, 10:41 AM

## 2015-03-26 NOTE — [facility] Suicide Risk Assessment (Signed)
[facility] INPATIENT:  Family/Significant Other Suicide Prevention Education  Suicide Prevention Education:  Patient Refusal for Family/Significant Other Suicide Prevention Education: The patient [name] has refused to provide written consent for family/significant other to be provided Family/Significant Other Suicide Prevention Education during admission and/or prior to discharge.  Physician notified.  Glennon MacLaws, Anastazia Creek P, MSW, LCSW 03/26/2015, 10:45 AM

## 2015-03-26 NOTE — Progress Notes (Signed)
Recreation Therapy Notes  INPATIENT RECREATION TR PLAN  Patient Details Name: [name] MRN: [id number] DOB: 01-07-1961 Today's Date: 03/26/2015  Rec Therapy Plan Is patient appropriate for Therapeutic Recreation?: Yes Treatment times per week: At least once a week TR Treatment/Interventions: 1:1 session, Group participation (Comment) (Appropriate participation in daily recreation therapy tx)  Discharge Criteria Pt will be discharged from therapy if:: Treatment goals are met, Discharged Treatment plan/goals/alternatives discussed and agreed upon by:: Patient/family  Discharge Summary Short term goals set: See Care Plan Short term goals met: Complete Progress toward goals comments: One-to-one attended Which groups?: Communication, Wellness, Leisure education, Other (Comment) (Problem solving, teamwork) One-to-one attended: Self-esteem, anger management, stress management Reason goals not met: N/A Therapeutic equipment acquired: None Reason patient discharged from therapy: Discharge from hospital Pt/family agrees with progress & goals achieved: Yes Date patient discharged from therapy: 03/26/15   Leonette [facility], LRT/CTRS 03/26/2015, 5:01 PM

## 2015-03-26 NOTE — [facility] Group Notes (Signed)
[facility] LCSW Group Therapy  03/26/2015 3:46 PM  Type of Therapy:  Group Therapy  Participation Level:  Active  Participation Quality:  Appropriate and Attentive  Affect:  Appropriate  Cognitive:  Alert, Appropriate and Oriented  Insight:  Engaged and Supportive  Engagement in Therapy:  Engaged and Supportive  Modes of Intervention:  Discussion, Socialization and Support  Summary of Progress/Problems: Patient attended and participated in group discussion appropriately. Patient introduced himself and participated in introductions appropriately. Patient shared that his faith was a major part of coping for him and offered support to other group members. Patient was able to identify how his support system has helped him but his family gets tired of hearing those things needs to follow up with his psychiatrist and therapist to be able to talk about his struggles.    Lulu RidingIngle, Rashon Westrup T, MSW, LCSWA 03/26/2015, 3:46 PM

## 2015-03-26 NOTE — Progress Notes (Signed)
Patient discharged home via bus. Instructions reviewed. Patient verbalized understanding of meds and follow up care. Seven-day supply of meds given. Belongings returned. Denies SI/HI/AVH.

## 2015-03-26 NOTE — Progress Notes (Signed)
Recreation Therapy Notes  Date: 10.31.16 Time: 3:00 pm Location: Craft [facility]  Group Topic: Wellness  Goal Area(s) Addresses:  Patient will identify at least one item per dimension of health. Patient will examine areas they are deficient in.  Behavioral Response: Attentive, Interactive  Intervention: 6 Dimensions of Health  Activity: Patients were given a worksheet with the definitions of the 6 dimensions of health. Patients were given a second worksheet with the 6 dimensions of health on it and instructed to write out at least one item they are currently doing in each dimension.  Education: LRT educated patients on way they can improve each dimension.  Education Outcome: Acknowledges education/In group clarification offered  Clinical Observations/Feedback: Patient completed activity by writing at least 2 items in each dimension. Patient contributed to group discussion by stating what areas he was giving enough attention to, what areas he was not giving enough attention to, and how he can improve certain wellness areas.  Jacquelynn CreeGreene,Biddie Sebek M, LRT/CTRS 03/26/2015 4:57 PM

## 2015-12-05 ENCOUNTER — Encounter (HOSPITAL_COMMUNITY): Payer: Self-pay | Admitting: Emergency Medicine

## 2015-12-05 ENCOUNTER — Emergency Department (HOSPITAL_COMMUNITY)
Admission: EM | Admit: 2015-12-05 | Discharge: 2015-12-06 | Payer: Medicaid Other | Attending: Emergency Medicine | Admitting: Emergency Medicine

## 2015-12-05 DIAGNOSIS — R45851 Suicidal ideations: Secondary | ICD-10-CM | POA: Diagnosis present

## 2015-12-05 DIAGNOSIS — F314 Bipolar disorder, current episode depressed, severe, without psychotic features: Secondary | ICD-10-CM | POA: Diagnosis present

## 2015-12-05 DIAGNOSIS — F250 Schizoaffective disorder, bipolar type: Secondary | ICD-10-CM | POA: Diagnosis not present

## 2015-12-05 DIAGNOSIS — Z87891 Personal history of nicotine dependence: Secondary | ICD-10-CM | POA: Diagnosis not present

## 2015-12-05 LAB — CBC
HCT: 49.1 % (ref 39.0–52.0)
Hemoglobin: 16 g/dL (ref 13.0–17.0)
MCH: 30.9 pg (ref 26.0–34.0)
MCHC: 32.6 g/dL (ref 30.0–36.0)
MCV: 95 fL (ref 78.0–100.0)
PLATELETS: 189 10*3/uL (ref 150–400)
RBC: 5.17 MIL/uL (ref 4.22–5.81)
RDW: 12.5 % (ref 11.5–15.5)
WBC: 5.2 10*3/uL (ref 4.0–10.5)

## 2015-12-05 LAB — COMPREHENSIVE METABOLIC PANEL
ALT: 23 U/L (ref 17–63)
AST: 25 U/L (ref 15–41)
Albumin: 4 g/dL (ref 3.5–5.0)
Alkaline Phosphatase: 70 U/L (ref 38–126)
Anion gap: 7 (ref 5–15)
BUN: 12 mg/dL (ref 6–20)
CHLORIDE: 104 mmol/L (ref 101–111)
CO2: 26 mmol/L (ref 22–32)
Calcium: 9.5 mg/dL (ref 8.9–10.3)
Creatinine, Ser: 1 mg/dL (ref 0.61–1.24)
Glucose, Bld: 77 mg/dL (ref 65–99)
POTASSIUM: 3.8 mmol/L (ref 3.5–5.1)
Sodium: 137 mmol/L (ref 135–145)
Total Bilirubin: 0.3 mg/dL (ref 0.3–1.2)
Total Protein: 7.2 g/dL (ref 6.5–8.1)

## 2015-12-05 LAB — ACETAMINOPHEN LEVEL: Acetaminophen (Tylenol), Serum: <10 ug/mL — ABNORMAL LOW (ref 10–30)

## 2015-12-05 LAB — ETHANOL

## 2015-12-05 LAB — SALICYLATE LEVEL

## 2015-12-05 LAB — RAPID URINE DRUG SCREEN, HOSP PERFORMED
AMPHETAMINES: NOT DETECTED
BENZODIAZEPINES: NOT DETECTED
Barbiturates: NOT DETECTED
Cocaine: NOT DETECTED
OPIATES: NOT DETECTED
Tetrahydrocannabinol: NOT DETECTED

## 2015-12-05 NOTE — ED Notes (Signed)
Pt. reports suicidal ideation plans to shoot himself with auditory and visual hallucinations for several days .

## 2015-12-06 DIAGNOSIS — F250 Schizoaffective disorder, bipolar type: Secondary | ICD-10-CM | POA: Diagnosis not present

## 2015-12-06 LAB — URINALYSIS, ROUTINE W REFLEX MICROSCOPIC
BILIRUBIN URINE: NEGATIVE
Glucose, UA: NEGATIVE mg/dL
Hgb urine dipstick: NEGATIVE
KETONES UR: NEGATIVE mg/dL
NITRITE: NEGATIVE
Protein, ur: NEGATIVE mg/dL
Specific Gravity, Urine: 1.023 (ref 1.005–1.030)
pH: 6 (ref 5.0–8.0)

## 2015-12-06 LAB — URINE MICROSCOPIC-ADD ON

## 2015-12-06 MED ORDER — LURASIDONE HCL 80 MG PO TABS
80.0000 mg | ORAL_TABLET | Freq: Every evening | ORAL | Status: DC
  Filled 2015-12-06: qty 1

## 2015-12-06 MED ORDER — ZOLPIDEM TARTRATE 5 MG PO TABS: 10.0000 mg | ORAL_TABLET | Freq: Every evening | ORAL | Status: DC | PRN

## 2015-12-06 MED ORDER — RISPERIDONE 0.5 MG PO TABS
2.0000 mg | ORAL_TABLET | Freq: Two times a day (BID) | ORAL | Status: DC
  Administered 2015-12-06: 2 mg via ORAL
  Filled 2015-12-06: qty 4

## 2015-12-06 MED ORDER — FOSFOMYCIN TROMETHAMINE 3 G PO PACK
3.0000 g | PACK | Freq: Once | ORAL | Status: AC
  Administered 2015-12-06: 3 g via ORAL
  Filled 2015-12-06: qty 3

## 2015-12-06 MED ORDER — HYDROXYZINE HCL 25 MG PO TABS: 50.0000 mg | ORAL_TABLET | Freq: Every day | ORAL | Status: DC

## 2015-12-06 MED ORDER — BUPROPION HCL ER (XL) 150 MG PO TB24
150.0000 mg | ORAL_TABLET | Freq: Every day | ORAL | Status: DC
  Administered 2015-12-06: 150 mg via ORAL
  Filled 2015-12-06: qty 1

## 2015-12-06 MED ORDER — BENZTROPINE MESYLATE 1 MG PO TABS
1.0000 mg | ORAL_TABLET | Freq: Every day | ORAL | Status: DC
  Administered 2015-12-06: 1 mg via ORAL
  Filled 2015-12-06: qty 1

## 2015-12-06 MED ORDER — FLUOXETINE HCL 20 MG PO CAPS
20.0000 mg | ORAL_CAPSULE | Freq: Every day | ORAL | Status: DC
  Administered 2015-12-06: 20 mg via ORAL
  Filled 2015-12-06: qty 1

## 2015-12-06 MED ORDER — TOPIRAMATE 25 MG PO TABS
25.0000 mg | ORAL_TABLET | Freq: Two times a day (BID) | ORAL | Status: DC
  Administered 2015-12-06: 25 mg via ORAL
  Filled 2015-12-06: qty 1

## 2015-12-06 MED ORDER — LAMOTRIGINE 25 MG PO TABS: 50.0000 mg | ORAL_TABLET | Freq: Every day | ORAL | Status: DC

## 2015-12-06 MED ORDER — HYDROXYZINE HCL 25 MG PO TABS
25.0000 mg | ORAL_TABLET | Freq: Three times a day (TID) | ORAL | Status: DC | PRN
  Administered 2015-12-06 (×2): 25 mg via ORAL
  Filled 2015-12-06 (×2): qty 1

## 2015-12-06 NOTE — ED Notes (Signed)
Laboratory notified on pt's pending urinalysis .

## 2015-12-06 NOTE — Progress Notes (Signed)
[facility] She states that Dr. Enedina FinnerGoli has accepted pt, report # [phone], pending pt be under IVC prior to transfer due to assessment indicating pt presents with active psychotic sx and suicidal thoughts with plan (see TTS assessment). States voluntary admission cannot be offered. Spoke with ED CSW- IVC in progress. Called back to update [facility].   Ilean SkillMeghan Keziyah Kneale, MSW, LCSW Clinical Social Work, Disposition  12/06/2015 [phone]

## 2015-12-06 NOTE — Progress Notes (Signed)
Seeking inpatient psychiatric treatment for pt at recommendation of TTS. [facility].  [facility]- per Dorina HoyerJoanie (notes only 2 beds opening today and 50+ referrals)  Left voicemails for [facility], [facility], and [facility] if [facility].  [facility].  Ilean SkillMeghan Khayla Koppenhaver, MSW, LCSW Clinical Social Work, Disposition  12/06/2015 [phone]

## 2015-12-06 NOTE — ED Notes (Signed)
Shanda BumpsJessica [phone]) [facility] to bed [facility].  Contact number for report to be called: [phone]; accepted by Dr. Enedina FinnerGoli.   CSW contacted me, pt in currently voluntary status, she will make arrangements for pt to be involuntary.
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
## 2015-12-06 NOTE — ED Notes (Signed)
Tele-psych monitor in place for requested re-evaluation.  Pt requesting valtrex and pyridium for urinary symptoms, reports dysuria.

## 2015-12-06 NOTE — ED Notes (Signed)
TTS video conference completed .

## 2015-12-06 NOTE — ED Notes (Signed)
Breakfast given.  
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
## 2015-12-06 NOTE — ED Provider Notes (Addendum)
CSN: [id number]     Arrival date & time 12/05/15  1849 History   First MD Initiated Contact with Patient 12/05/15 2355     Chief Complaint  Patient presents with  . Suicidal     (Consider location/radiation/quality/duration/timing/severity/associated sxs/prior Treatment) HPI Comments: 55 year old male with history of depression, bipolar 1 disorder, schizophrenia, PTSD, and arthritis presents to the emergency department for psychiatric evaluation. He was recently hospitalized at [facility] for 10 days beginning on 11/13/2015 and states he was taken off of his Lithium and started on a number of different psychiatric medications. He states that he does not feel as though these are helping him. He has had increased auditory hallucinations telling him to kill himself. He states that he was trying to get access to his sister's gun to shoot himself in the head rather than cut himself. He continues to have strong suicidal ideations. No homicidal thoughts. Patient denies alcohol and illicit drug use. He states that he feels hopeless.  The history is provided by the patient. No language interpreter was used.    Past Medical History  Diagnosis Date  . Depression   . Arthritis   . Bipolar 1 disorder (HCC)   . PTSD (post-traumatic stress disorder)   . Schizophrenia (H[facility])   . Balanitis    History reviewed. No pertinent past surgical history. No family history on file. Social History  Substance Use Topics  . Smoking status: Former Smoker -- 0.00 packs/day    Types: Cigarettes    Quit date: 05/02/2014  . Smokeless tobacco: Never Used  . Alcohol Use: 0.0 oz/week    Review of Systems  Psychiatric/Behavioral: Positive for suicidal ideas, hallucinations and behavioral problems.  All other systems reviewed and are negative.   Allergies  Lithium and Hydrocodone-acetaminophen  Home Medications   Prior to Admission medications   Medication Sig Start Date End Date Taking?  Authorizing Provider  buPROPion (WELLBUTRIN XL) 150 MG 24 hr tablet Take 150 mg by mouth daily. 11/23/15 11/22/16 Yes Historical Provider, MD  FLUoxetine (PROZAC) 20 MG capsule Take 20 mg by mouth daily. 11/23/15  Yes Historical Provider, MD  hydrocortisone cream 1 % Apply topically 2 (two) times daily. 11/23/15 11/22/16 Yes Historical Provider, MD  lurasidone (LATUDA) 80 MG TABS tablet Take 80 mg by mouth every evening. 11/23/15  Yes Historical Provider, MD  Omega-3 Fatty Acids (FISH OIL) 1000 MG CAPS Take 2,000 mg by mouth 2 (two) times daily. 11/23/15  Yes Historical Provider, MD  topiramate (TOPAMAX) 25 MG tablet Take 25 mg by mouth 2 (two) times daily. 11/23/15 11/22/16 Yes Historical Provider, MD  valACYclovir (VALTREX) 500 MG tablet Take 1 tablet (500 mg total) by mouth 2 (two) times daily. 03/26/15  Yes Jolanta B Pucilowska, MD  hydrOXYzine (ATARAX/VISTARIL) 25 MG tablet Take 1 tablet (25 mg total) by mouth 3 (three) times daily as needed for anxiety. Patient not taking: Reported on 12/06/2015 03/26/15   Shari Prows, MD  hydrOXYzine (ATARAX/VISTARIL) 50 MG tablet Take 1 tablet (50 mg total) by mouth at bedtime. Patient not taking: Reported on 12/06/2015 03/26/15   Shari Prows, MD  lamoTRIgine (LAMICTAL) 25 MG tablet Take 2 tablets (50 mg total) by mouth at bedtime. For 7 days, 4 tabs for 2 weeks, 8 tabs or 200 mg after. Patient not taking: Reported on 12/06/2015 03/26/15   Shari Prows, MD  neomycin-bacitracin-polymyxin (NEOSPORIN) ointment Apply topically 2 (two) times daily. apply to eye Patient not taking: Reported on 12/06/2015 03/26/15  Shari ProwsJolanta B Pucilowska, MD  risperiDONE (RISPERDAL) 2 MG tablet Take 1 tablet (2 mg total) by mouth 2 (two) times daily. Patient not taking: Reported on 12/06/2015 03/26/15   Shari ProwsJolanta B Pucilowska, MD  zolpidem (AMBIEN) 10 MG tablet Take 1 tablet (10 mg total) by mouth at bedtime as needed for sleep. Patient not taking: Reported on 12/06/2015  03/26/15   Jolanta B Pucilowska, MD   BP 104/64 mmHg  Pulse 64  Temp(Src) 97.9 F (36.6 C) (Oral)  Resp 16  Ht 6\' 3"  (1.905 m)  Wt 99.791 kg  BMI 27.50 kg/m2  SpO2 97%   Physical Exam  Constitutional: He is oriented to person, place, and time. He appears well-developed and well-nourished. No distress.  HENT:  Head: Normocephalic and atraumatic.  Eyes: Conjunctivae and EOM are normal. No scleral icterus.  Neck: Normal range of motion.  Pulmonary/Chest: Effort normal. No respiratory distress.  Musculoskeletal: Normal range of motion.  Neurological: He is alert and oriented to person, place, and time. He exhibits normal muscle tone. Coordination normal.  Skin: Skin is warm and dry. No rash noted. He is not diaphoretic. No erythema. No pallor.  Psychiatric: His speech is normal and behavior is normal. He exhibits a depressed mood. He expresses suicidal ideation. He expresses no homicidal ideation. He expresses suicidal plans. He expresses no homicidal plans.  Nursing note and vitals reviewed.   ED Course  Procedures (including critical care time) Labs Review Labs Reviewed  ACETAMINOPHEN LEVEL - Abnormal; Notable for the following:    Acetaminophen (Tylenol), Serum <10 (*)    All other components within normal limits  COMPREHENSIVE METABOLIC PANEL  ETHANOL  SALICYLATE LEVEL  CBC  URINE RAPID DRUG SCREEN, HOSP PERFORMED  URINALYSIS, ROUTINE W REFLEX MICROSCOPIC (NOT AT [facility])    Imaging Review No results found.   I have personally reviewed and evaluated these images and lab results as part of my medical decision-making.   EKG Interpretation None      MDM   Final diagnoses:  Schizoaffective disorder, bipolar type [facility](HCC)    55 year old male presents to the emergency department for psychiatric evaluation. He has been medically cleared and evaluated by TTS who recommended inpatient management. TTS seeking placement. Disposition to be set by oncoming ED provider. Patient  did request that his urinalysis be run for infection as he has a history of UTI. This result is pending and should be followed up on prior to discharge or transfer.   Filed Vitals:   12/05/15 2001 12/06/15 0405  BP: 112/76 104/64  Pulse: 84 64  Temp: 98 F (36.7 C) 97.9 F (36.6 C)  TempSrc: Oral Oral  Resp: 17 16  Height: 6\' 3"  (1.905 m)   Weight: 99.791 kg   SpO2: 100% 97%     Antony MaduraKelly Humes, PA-C 12/06/15 0550  Dione Boozeavid Anaissa Macfadden, MD 12/06/15 [id number]  Dione Boozeavid Shamari Trostel, MD 12/06/15 [phone]

## 2015-12-06 NOTE — ED Provider Notes (Signed)
Patient reevaluated at 1500 p.m. I received a call earlier from behavioral health staff that they felt he needed hospitalization and there was seeking placement. He now has an inpatient bed. He remains suicidal. He was placed in her IVC by myself. Urine does show white blood cells and bacteria. Culture requested. Given single-dose of fosfomycin. Otherwise appropriate for transfer and stable.  Rolland PorterMark Ellisha Bankson, MD 12/06/15 1538

## 2015-12-07 LAB — URINE CULTURE: Special Requests: NORMAL

## 2016-02-21 ENCOUNTER — Encounter (HOSPITAL_COMMUNITY): Payer: Self-pay | Admitting: *Deleted

## 2016-02-21 ENCOUNTER — Emergency Department (HOSPITAL_COMMUNITY)
Admission: EM | Admit: 2016-02-21 | Discharge: 2016-02-22 | Disposition: A | Discharge status: Home / Self Care | Payer: Medicaid Other | Attending: Emergency Medicine | Admitting: Emergency Medicine

## 2016-02-21 DIAGNOSIS — Z79899 Other long term (current) drug therapy: Secondary | ICD-10-CM | POA: Insufficient documentation

## 2016-02-21 DIAGNOSIS — N481 Balanitis: Secondary | ICD-10-CM | POA: Insufficient documentation

## 2016-02-21 DIAGNOSIS — F314 Bipolar disorder, current episode depressed, severe, without psychotic features: Secondary | ICD-10-CM | POA: Diagnosis not present

## 2016-02-21 DIAGNOSIS — R45851 Suicidal ideations: Secondary | ICD-10-CM

## 2016-02-21 DIAGNOSIS — Z87891 Personal history of nicotine dependence: Secondary | ICD-10-CM | POA: Insufficient documentation

## 2016-02-21 LAB — CBC
HEMATOCRIT: 44.7 % (ref 39.0–52.0)
HEMOGLOBIN: 14.3 g/dL (ref 13.0–17.0)
MCH: 30.4 pg (ref 26.0–34.0)
MCHC: 32 g/dL (ref 30.0–36.0)
MCV: 95.1 fL (ref 78.0–100.0)
Platelets: 215 10*3/uL (ref 150–400)
RBC: 4.7 MIL/uL (ref 4.22–5.81)
RDW: 12.7 % (ref 11.5–15.5)
WBC: 4.2 10*3/uL (ref 4.0–10.5)

## 2016-02-21 LAB — COMPREHENSIVE METABOLIC PANEL
ALBUMIN: 4.5 g/dL (ref 3.5–5.0)
ALK PHOS: 62 U/L (ref 38–126)
ALT: 17 U/L (ref 17–63)
ANION GAP: 7 (ref 5–15)
AST: 21 U/L (ref 15–41)
BUN: 20 mg/dL (ref 6–20)
CALCIUM: 9.4 mg/dL (ref 8.9–10.3)
CHLORIDE: 108 mmol/L (ref 101–111)
CO2: 23 mmol/L (ref 22–32)
Creatinine, Ser: 1.02 mg/dL (ref 0.61–1.24)
GFR calc Af Amer: >60 mL/min (ref >60)
GFR calc non Af Amer: >60 mL/min (ref >60)
GLUCOSE: 96 mg/dL (ref 65–99)
Potassium: 4.1 mmol/L (ref 3.5–5.1)
SODIUM: 138 mmol/L (ref 135–145)
Total Bilirubin: 0.7 mg/dL (ref 0.3–1.2)
Total Protein: 7.5 g/dL (ref 6.5–8.1)

## 2016-02-21 LAB — ETHANOL: Alcohol, Ethyl (B): <5 mg/dL (ref <5)

## 2016-02-21 LAB — URINALYSIS, ROUTINE W REFLEX MICROSCOPIC
BILIRUBIN URINE: NEGATIVE
GLUCOSE, UA: NEGATIVE mg/dL
HGB URINE DIPSTICK: NEGATIVE
KETONES UR: NEGATIVE mg/dL
Nitrite: NEGATIVE
PROTEIN: NEGATIVE mg/dL
Specific Gravity, Urine: 1.018 (ref 1.005–1.030)
pH: 6 (ref 5.0–8.0)

## 2016-02-21 LAB — RAPID URINE DRUG SCREEN, HOSP PERFORMED
AMPHETAMINES: NOT DETECTED
BARBITURATES: NOT DETECTED
BENZODIAZEPINES: NOT DETECTED
COCAINE: NOT DETECTED
Opiates: NOT DETECTED
TETRAHYDROCANNABINOL: NOT DETECTED

## 2016-02-21 LAB — URINE MICROSCOPIC-ADD ON

## 2016-02-21 LAB — SALICYLATE LEVEL: Salicylate Lvl: <4 mg/dL (ref 2.8–30.0)

## 2016-02-21 LAB — ACETAMINOPHEN LEVEL

## 2016-02-21 MED ORDER — HYDROXYZINE HCL 25 MG PO TABS
50.0000 mg | ORAL_TABLET | Freq: Every day | ORAL | Status: DC
  Administered 2016-02-21: 50 mg via ORAL
  Filled 2016-02-21: qty 2

## 2016-02-21 MED ORDER — LURASIDONE HCL 80 MG PO TABS
80.0000 mg | ORAL_TABLET | Freq: Every evening | ORAL | Status: DC
  Administered 2016-02-21: 80 mg via ORAL
  Filled 2016-02-21: qty 1

## 2016-02-21 MED ORDER — IBUPROFEN 800 MG PO TABS
800.0000 mg | ORAL_TABLET | Freq: Two times a day (BID) | ORAL | Status: DC | PRN
  Administered 2016-02-21 – 2016-02-22 (×2): 800 mg via ORAL
  Filled 2016-02-21 (×2): qty 1

## 2016-02-21 MED ORDER — FLUOXETINE HCL 20 MG PO CAPS
20.0000 mg | ORAL_CAPSULE | Freq: Every day | ORAL | Status: DC
  Administered 2016-02-21 – 2016-02-22 (×2): 20 mg via ORAL
  Filled 2016-02-21 (×2): qty 1

## 2016-02-21 MED ORDER — HYDROCORTISONE 1 % EX CREA
TOPICAL_CREAM | Freq: Two times a day (BID) | CUTANEOUS | Status: DC
  Administered 2016-02-21 – 2016-02-22 (×3): via TOPICAL
  Filled 2016-02-21: qty 28

## 2016-02-21 MED ORDER — IBUPROFEN 200 MG PO TABS
400.0000 mg | ORAL_TABLET | Freq: Four times a day (QID) | ORAL | Status: DC | PRN
Start: 2016-02-21 — End: 2016-02-21
  Administered 2016-02-21: 400 mg via ORAL
  Filled 2016-02-21: qty 2

## 2016-02-21 MED ORDER — FLUCONAZOLE 150 MG PO TABS
150.0000 mg | ORAL_TABLET | Freq: Once | ORAL | Status: AC
  Administered 2016-02-21: 150 mg via ORAL
  Filled 2016-02-21: qty 1

## 2016-02-21 NOTE — ED Triage Notes (Addendum)
[facility], but has none of his psych medications and began to feel suicidal.  He was brought here by staff to get stabilized on his meds and then he may return to [facility].  Patient states he is a cutter and has had thoughts of hurting himself that way or jumping in front of cars.  Patient's last use of EtOH was 2 weeks ago.  Patient denies HI.  He does not smoke and denies use of street drugs.

## 2016-02-21 NOTE — ED Notes (Signed)
Pt A&O x 3, no distress noted, calm & cooperative, interactive with staff.  Denies SI or HI.  No AVH.  Monitoring for safety, Q 15 min checks in effect.

## 2016-02-21 NOTE — BH Assessment (Addendum)
Assessment Note  [name] is an 55 y.o. male with history of Bipolar I Disorder, Depression, PTSD, and Schizophrenia. Patient brought to [facility] by staff at the [facility]. Patient admitted to the [facility] 02/04/2016 for substance abuse rehab. Patient did not tell staff that had a mental health diagnosis afraid that he would would be denied services. Patient decided to tell a staff member that he has a mental health illness and feels "sorta suicidal" today. The staff member reportedly told patient that he would need to be evaluated and observed for 1 day. Patient stating that tommorow he is able to return back to the [facility] house. He also must return with prescription medications including Latuda, Prozac, Valtrex, and Vistaril.   Patient admits that his is passively suicidal. Sts, "If I had to hurt myself I would cut myself". He has a history of suicidal ideations, gestures, and attempts. Patient has tried to attempt suicide 3 or 4x's in the past (overdose, cutting, running into traffic, etc.). Patient reports a family history of mental health illness. Patient admits to depressive symptoms including: fatigue and hopelessness. Sleep and appetite are both fair. No HI. No history of aggressive and/or assaultive behaviors. No legal issues. Patient admits to "on and off" auditory hallucinations of "jibberish". No command type hallucinations. Patient does not appear to be responding to internal stimuli.  No current alcohol or drug use. Patient does have a history of alcohol and cocaine use. Last use of both substances was 2 weeks ago. Patient does not have a current outpatient mental health provider. He has a history of multiple INPT mental health/substance abuse hospitalizations.  Diagnosis:  Bipolar I Disorder, Depression, PTSD, and Schizophrenia  Past Medical History:  Past Medical History:  Diagnosis Date  . Arthritis   . Balanitis   . Bipolar 1 disorder (HCC)   . Depression   . PTSD  (post-traumatic stress disorder)   . Schizophrenia (HCC)     istory reviewed. No pertinent surgical history.  Family History: No family history on file.  Social History:  reports that he quit smoking about 21 months ago. His smoking use included Cigarettes. He smoked 0.00 packs per day. He has never used smokeless tobacco. He reports that he drinks alcohol. He reports that he uses drugs, including Marijuana and Cocaine.  Additional Social History:  Alcohol / Drug Use Pain Medications: No abuse reported Prescriptions: No abuse reported Over the Counter: No abuse reported History of alcohol / drug use?: Yes Longest period of sobriety (when/how long): 1 yr states "last year" Negative Consequences of Use: Financial, Personal relationships, Work / School Substance #1 Name of Substance 1: Patient has a history of cocaine use 1 - Age of First Use: 31  1 - Amount (size/oz): varies with money availability 1 - Frequency: varies; "on and off" 1 - Duration: several yrs "on and off" 1 - Last Use / Amount: 2 weeks ago Substance #2 Name of Substance 2: Patient has a history of alcohol use 2 - Age of First Use: 31 2 - Amount (size/oz): varies with money availability 2 - Frequency: varies; "on and off" 2 - Duration: several yrs "on and off" 2 - Last Use / Amount: 2 weeks ago  CIWA: CIWA-Ar BP: 119/76 Pulse Rate: 91 COWS:    Allergies:  Allergies  Allergen Reactions  . Lithium Other (See Comments)    caused muscle twitching episodes  . Hydrocodone-Acetaminophen Rash    Pt states he has no allergy to this med  Home Medications:  (Not in a hospital admission)  OB/GYN Status:  No LMP for male patient.  General Assessment Data Location of Assessment: [facility] TTS Assessment: In system Is this a Tele or Face-to-Face Assessment?: Face-to-Face Is this an Initial Assessment or a Re-assessment for this encounter?: Initial Assessment Marital status: Single Maiden name:  (n/a) Is patient  pregnant?: No Pregnancy Status: No Living Arrangements: Parent, Other (Comment) (typically lives w/ mom; [place] ) Can pt return to current living arrangement?: Yes Admission Status: Voluntary Is patient capable of signing voluntary admission?: Yes Referral Source: Self/Family/Friend Insurance type:  (Medicaid )  Medical Screening Exam [facility] Walk-in ONLY) Medical Exam completed: No  Crisis Care Plan Living Arrangements: Parent, Other (Comment) (typically lives w/ mom; [place] ) Legal Guardian: Other: (no legal guardian ) Name of Psychiatrist:  (no current psychiatrist; [facility] ) Name of Therapist:  (no current therapist; [name] the [facility])  Education Status Is patient currently in school?: No Current Grade:  (n/a) Highest grade of school patient has completed:  (n/a) Name of school:  (n/a) Contact person:  (n/a)  Risk to self with the past 6 months Suicidal Ideation: Yes-Currently Present Has patient been a risk to self within the past 6 months prior to admission? : No Suicidal Intent: No-Not Currently/Within Last 6 Months Has patient had any suicidal intent within the past 6 months prior to admission? : No Is patient at risk for suicide?: No Suicidal Plan?:  (cut self ) Has patient had any suicidal plan within the past 6 months prior to admission? : No (patient denies) Access to Means: Yes (access to sharp objects) Specify Access to Suicidal Means:  (sharp objects and knives) What has been your use of drugs/alcohol within the last 12 months?:  (history of polysubstance abuse) Previous Attempts/Gestures: Yes (multiple) How many times?:  (3-4 prior attempts-overdoses, walking in traffic, etc) Other Self Harm Risks:  (cutting ) Triggers for Past Attempts: Other (Comment) (depression, off medications, etc, ) Intentional Self Injurious Behavior: Cutting Comment - Self Injurious Behavior:  (history of cutting  ) Family Suicide History: Unknown Recent stressful life event(s): Other (Comment) (recent breakup with gfriend; off meds) Persecutory voices/beliefs?: No Depression: Yes Depression Symptoms: Feeling angry/irritable, Feeling worthless/self pity, Loss of interest in usual pleasures, Guilt, Fatigue, Isolating, Tearfulness, Insomnia, Despondent Substance abuse history and/or treatment for substance abuse?: No Suicide prevention information given to non-admitted patients: Yes  Risk to Others within the past 6 months Homicidal Ideation: No Does patient have any lifetime risk of violence toward others beyond the six months prior to admission? : No Thoughts of Harm to Others: No Current Homicidal Intent: No Current Homicidal Plan: No Access to Homicidal Means: No Identified Victim:  (n/a) History of harm to others?: No Assessment of Violence: None Noted Violent Behavior Description:  (patient is calm and cooperative ) Does patient have access to weapons?: No Criminal Charges Pending?: No Does patient have a court date: No Is patient on probation?: No  Psychosis Hallucinations: Auditory ("Sometimes I hear jibberish...but I'm ok right now") Delusions: None noted  Mental Status Report Appearance/Hygiene: In scrubs Eye Contact: Good Motor Activity: Freedom of movement Speech: Logical/coherent Level of Consciousness: Alert Mood: Depressed Affect: Appropriate to circumstance Anxiety Level: None Thought Processes: Coherent, Relevant Judgement: Impaired Orientation: Person, Place, Time, Situation Obsessive Compulsive Thoughts/Behaviors: None  Cognitive Functioning Concentration: Decreased Memory: Recent Intact, Remote Intact IQ: Average Level of Function:  (n/a) Insight:  Poor Impulse Control: Poor Appetite: Poor Weight Loss:  (none reported) Weight Gain:  (none reported ) Sleep: Decreased Total Hours of Sleep:  (varies) Vegetative Symptoms: None  ADLScreening [facility] Assessment  Services) Patient's cognitive ability adequate to safely complete daily activities?: Yes Patient able to express need for assistance with ADLs?: Yes Independently performs ADLs?: Yes (appropriate for developmental age)  Prior Inpatient Therapy Prior Inpatient Therapy: Yes Prior Therapy Dates:  (multiple prior admits ) Prior Therapy Facilty/Provider(s):  [facility]) Reason for Treatment:  (suicidal ideations, gestures, attempts, depression)  Prior Outpatient Therapy Prior Outpatient Therapy: Yes Prior Therapy Dates:  (past-[facility] ) Prior Therapy Facilty/Provider(s):  [facility] ) Reason for Treatment:  (medication managment ) Does patient have an ACCT team?: No Does patient have Intensive In-House Services?  : No Does patient have [facility] services? : No Does patient have P4[facility] services?: No  ADL Screening (condition at time of admission) Patient's cognitive ability adequate to safely complete daily activities?: Yes Is the patient deaf or have difficulty hearing?: No Does the patient have difficulty seeing, even when wearing glasses/contacts?: No Does the patient have difficulty concentrating, remembering, or making decisions?: No Patient able to express need for assistance with ADLs?: Yes Does the patient have difficulty dressing or bathing?: No Independently performs ADLs?: Yes (appropriate for developmental age) Does the patient have difficulty walking or climbing stairs?: No Weakness of Legs: None Weakness of Arms/Hands: None  Home Assistive Devices/Equipment Home Assistive Devices/Equipment: None    Abuse/Neglect Assessment (Assessment to be complete while patient is alone) Physical Abuse: Denies Verbal Abuse: Denies Sexual Abuse: Denies Exploitation of patient/patient's resources: Denies Self-Neglect: Denies Values / Beliefs Cultural Requests During Hospitalization: None   Advance Directives (For Healthcare) Does patient have an advance directive?: No Would patient like  information on creating an advanced directive?: No - patient declined information Nutrition Screen- [facility] Adult/[facility] Patient's home diet: Regular  Additional Information 1:1 In Past 12 Months?: No CIRT Risk: No Elopement Risk: No Does patient have medical clearance?: Yes     Disposition: Disposition Initial Assessment Completed for this Encounter: Yes Disposition of Patient: Inpatient treatment program (Patient meets criteria for OBS or overnight observation in the ED, per Dr. Jannifer Franklin ) Type of inpatient treatment program: Adult  On Site Evaluation by:   Reviewed with Physician: Dr. Julien Nordmann, Margret Chance 02/21/2016 10:50 AM

## 2016-02-21 NOTE — ED Notes (Signed)
Patient's belongings secured behind nurse's station.  Patient dressed in paper scrubs.

## 2016-02-21 NOTE — ED Provider Notes (Signed)
[facility]-EMERGENCY DEPT Provider Note   CSN: [id number] Arrival date & time: 02/21/16  0746     History   Chief Complaint Chief Complaint  Patient presents with  . Suicidal    HPI [name] is a 55 y.o. male.  Patient presents to the emergency department with chief complaint of mania and suicidal ideation. Patient states that he has bipolar 1 and PTSD. States that he has been staying at [facility], but has not had any of his psychiatric medications. States that he has felt more manic since being off of his medications. States that he has been very confrontational. He states that he is "a cutter," and states that he occasionally has thoughts of cutting himself severely or "jumping out in front of a car." He denies any homicidal ideation. Denies any recreational drug use, although this is listed extensively in his history.  Otherwise, he denies any medical complaints with the exception of a "balanitis flare." Patient states that he normally uses hydrocortisone cream and Valtrex for this.   The history is provided by the patient. No language interpreter was used.    Past Medical History:  Diagnosis Date  . Arthritis   . Balanitis   . Bipolar 1 disorder (HCC)   . Depression   . PTSD (post-traumatic stress disorder)   . Schizophrenia [facility])     Patient Active Problem List   Diagnosis Date Noted  . Schizoaffective disorder, bipolar type (HCC)   . Balanitis 03/26/2015  . Anxiety   . Substance induced mood disorder (HCC) 12/08/2013  . Cocaine use disorder, moderate, dependence (HCC) 06/29/2012  . Alcohol use disorder, moderate, dependence (HCC) 06/29/2012  . Bipolar I disorder, most recent episode depressed, severe without psychotic features (HCC) 04/22/2012    Class: Acute    History reviewed. No pertinent surgical history.     Home Medications    Prior to Admission medications   Medication Sig Start Date End Date Taking? Authorizing Provider  buPROPion (WELLBUTRIN  XL) 150 MG 24 hr tablet Take 150 mg by mouth daily. 11/23/15 11/22/16  Historical Provider, MD  FLUoxetine (PROZAC) 20 MG capsule Take 20 mg by mouth daily. 11/23/15   Historical Provider, MD  hydrocortisone cream 1 % Apply topically 2 (two) times daily. 11/23/15 11/22/16  Historical Provider, MD  hydrOXYzine (ATARAX/VISTARIL) 25 MG tablet Take 1 tablet (25 mg total) by mouth 3 (three) times daily as needed for anxiety. Patient not taking: Reported on 12/06/2015 03/26/15   Shari Prows, MD  hydrOXYzine (ATARAX/VISTARIL) 50 MG tablet Take 1 tablet (50 mg total) by mouth at bedtime. Patient not taking: Reported on 12/06/2015 03/26/15   Shari Prows, MD  lamoTRIgine (LAMICTAL) 25 MG tablet Take 2 tablets (50 mg total) by mouth at bedtime. For 7 days, 4 tabs for 2 weeks, 8 tabs or 200 mg after. Patient not taking: Reported on 12/06/2015 03/26/15   Shari Prows, MD  lurasidone (LATUDA) 80 MG TABS tablet Take 80 mg by mouth every evening. 11/23/15   Historical Provider, MD  neomycin-bacitracin-polymyxin (NEOSPORIN) ointment Apply topically 2 (two) times daily. apply to eye Patient not taking: Reported on 12/06/2015 03/26/15   Shari Prows, MD  Omega-3 Fatty Acids (FISH OIL) 1000 MG CAPS Take 2,000 mg by mouth 2 (two) times daily. 11/23/15   Historical Provider, MD  risperiDONE (RISPERDAL) 2 MG tablet Take 1 tablet (2 mg total) by mouth 2 (two) times daily. Patient not taking: Reported on 12/06/2015 03/26/15   Jolanta  B Pucilowska, MD  topiramate (TOPAMAX) 25 MG tablet Take 25 mg by mouth 2 (two) times daily. 11/23/15 11/22/16  Historical Provider, MD  valACYclovir (VALTREX) 500 MG tablet Take 1 tablet (500 mg total) by mouth 2 (two) times daily. 03/26/15   Shari Prows, MD  zolpidem (AMBIEN) 10 MG tablet Take 1 tablet (10 mg total) by mouth at bedtime as needed for sleep. Patient not taking: Reported on 12/06/2015 03/26/15   Shari Prows, MD    Family History No  family history on file.  Social History Social History  Substance Use Topics  . Smoking status: Former Smoker    Packs/day: 0.00    Types: Cigarettes    Quit date: 05/02/2014  . Smokeless tobacco: Never Used  . Alcohol use 0.0 oz/week     Allergies   Lithium and Hydrocodone-acetaminophen   Review of Systems Review of Systems  Genitourinary: Positive for dysuria.  Psychiatric/Behavioral: Positive for agitation, self-injury and suicidal ideas.  All other systems reviewed and are negative.    Physical Exam Updated Vital Signs BP 119/76 (BP Location: Left Arm)   Pulse 91   Temp 97.8 F (36.6 C) (Oral)   Resp 18   Ht 6\' 3"  (1.905 m)   Wt 98.9 kg   SpO2 98%   BMI 27.25 kg/m   Physical Exam  Constitutional: He is oriented to person, place, and time. He appears well-developed and well-nourished.  HENT:  Head: Normocephalic and atraumatic.  Eyes: Conjunctivae and EOM are normal. Pupils are equal, round, and reactive to light. Right eye exhibits no discharge. Left eye exhibits no discharge. No scleral icterus.  Neck: Normal range of motion. Neck supple. No JVD present.  Cardiovascular: Normal rate, regular rhythm and normal heart sounds.  Exam reveals no gallop and no friction rub.   No murmur heard. Pulmonary/Chest: Effort normal and breath sounds normal. No respiratory distress. He has no wheezes. He has no rales. He exhibits no tenderness.  Abdominal: Soft. He exhibits no distension and no mass. There is no tenderness. There is no rebound and no guarding.  Genitourinary:  Genitourinary Comments: Foreskin of penis is retractable, however there is swelling and cracking of the skin  Musculoskeletal: Normal range of motion. He exhibits no edema or tenderness.  Neurological: He is alert and oriented to person, place, and time.  Skin: Skin is warm and dry.  Psychiatric: He has a normal mood and affect. His behavior is normal. Judgment and thought content normal.  Nursing note  and vitals reviewed.    ED Treatments / Results  Labs (all labs ordered are listed, but only abnormal results are displayed) Labs Reviewed  CBC  URINE RAPID DRUG SCREEN, HOSP PERFORMED  COMPREHENSIVE METABOLIC PANEL  ETHANOL  SALICYLATE LEVEL  ACETAMINOPHEN LEVEL  URINALYSIS, ROUTINE W REFLEX MICROSCOPIC (NOT AT [facility])    EKG  EKG Interpretation None       Radiology No results found.  Procedures Procedures (including critical care time)  Medications Ordered in ED Medications - No data to display   Initial Impression / Assessment and Plan / ED Course  I have reviewed the triage vital signs and the nursing notes.  Pertinent labs & imaging results that were available during my care of the patient were reviewed by me and considered in my medical decision making (see chart for details).  Clinical Course    Patient with suicidal ideation and manic state. Will consult TTS for evaluation.    8:47 AM Patient medically  clear.  UA is pending, but medically stable for evaluation of TTS.  Will not need medical admission.  Given diflucan and hydrocortisone for balanitis.  2:59 PM TTS note reviewed.  Observation in ED and reassess in AM.   Final Clinical Impressions(s) / ED Diagnoses   Final diagnoses:  Suicidal ideation  Balanitis    New Prescriptions New Prescriptions   No medications on file     Roxy HorsemanRobert Koren Sermersheim, PA-C 02/21/16 1501    Charlynne Panderavid Hsienta Yao, MD 02/21/16 [phone]

## 2016-02-21 NOTE — ED Notes (Signed)
Pt arrived to unit pleasant and cooperative.  Pt was noted in bathroom on floor apparently praying.  Pt was oriented to room and unit.  15 minute checks and video monitoring  In place.

## 2016-02-22 MED ORDER — LURASIDONE HCL 80 MG PO TABS: 80.0000 mg | ORAL_TABLET | Freq: Every evening | ORAL | 0 refills | Status: DC

## 2016-02-22 MED ORDER — HYDROXYZINE HCL 50 MG PO TABS: 50.0000 mg | ORAL_TABLET | Freq: Every day | ORAL | 0 refills | Status: DC

## 2016-02-22 MED ORDER — FLUOXETINE HCL 20 MG PO CAPS: 20.0000 mg | ORAL_CAPSULE | Freq: Every day | ORAL | 0 refills | Status: DC

## 2016-02-22 NOTE — [facility] Suicide Risk Assessment (Signed)
Suicide Risk Assessment  Discharge Assessment   [facility] Discharge Suicide Risk Assessment   Principal Problem: Bipolar I disorder, most recent episode depressed, severe without psychotic features [facility]) Discharge Diagnoses:  Patient Active Problem List   Diagnosis Date Noted  . Bipolar I disorder, most recent episode depressed, severe without psychotic features (HCC) [F31.4] 04/22/2012    Priority: High    Class: Acute  . Substance induced mood disorder (HCC) [F19.94] 12/08/2013    Priority: Low  . Cocaine use disorder, moderate, dependence (HCC) [F14.20] 06/29/2012    Priority: Low  . Alcohol use disorder, moderate, dependence (HCC) [F10.20] 06/29/2012    Priority: Low  . Schizoaffective disorder, bipolar type (HCC) [F25.0]   . Balanitis [N48.1] 03/26/2015  . Anxiety [F41.9]     Total Time spent with patient: 45 minutes  Musculoskeletal: Strength & Muscle Tone: within normal limits Gait & Station: normal Patient leans: N/A  Psychiatric Specialty Exam: Physical Exam  Constitutional: He is oriented to person, place, and time. He appears well-developed and well-nourished.  HENT:  Head: Normocephalic.  Neck: Normal range of motion.  Respiratory: Effort normal.  Musculoskeletal: Normal range of motion.  Neurological: He is alert and oriented to person, place, and time.  Skin: Skin is warm and dry.  Psychiatric: He has a normal mood and affect. His speech is normal and behavior is normal. Judgment and thought content normal. Cognition and memory are normal.    Review of Systems  Constitutional: Negative.   HENT: Negative.   Eyes: Negative.   Respiratory: Negative.   Cardiovascular: Negative.   Gastrointestinal: Negative.   Genitourinary: Negative.   Musculoskeletal: Negative.   Skin: Negative.   Neurological: Negative.   Endo/Heme/Allergies: Negative.   Psychiatric/Behavioral: Negative.     Blood pressure 100/61, pulse 64, temperature 98 F (36.7 C), temperature source  Oral, resp. rate 18, height 6\' 3"  (1.905 m), weight 98.9 kg (218 lb), SpO2 100 %.Body mass index is 27.25 kg/m.  General Appearance: Casual  Eye Contact:  Good  Speech:  Normal Rate  Volume:  Normal  Mood:  Euthymic  Affect:  Congruent  Thought Process:  Coherent and Descriptions of Associations: Intact  Orientation:  Full (Time, Place, and Person)  Thought Content:  WDL  Suicidal Thoughts:  No  Homicidal Thoughts:  No  Memory:  Immediate;   Good Recent;   Good Remote;   Good  Judgement:  Fair  Insight:  Fair  Psychomotor Activity:  Normal  Concentration:  Concentration: Good and Attention Span: Good  Recall:  Good  Fund of Knowledge:  Good  Language:  Good  Akathisia:  No  Handed:  Right  AIMS (if indicated):     Assets:  Housing Leisure Time Physical Health Resilience Social Support  ADL's:  Intact  Cognition:  WNL  Sleep:       Mental Status Per Nursing Assessment::   On Admission:   suicidal ideations  Demographic Factors:  Male  Loss Factors: NA  Historical Factors: NA  Risk Reduction Factors:   Sense of responsibility to family, Living with another person, especially a relative, Positive social support and Positive therapeutic relationship  Continued Clinical Symptoms:  None   Cognitive Features That Contribute To Risk:  None    Suicide Risk:  Minimal: No identifiable suicidal ideation.  Patients presenting with no risk factors but with morbid ruminations; may be classified as minimal risk based on the severity of the depressive symptoms    Plan Of Care/Follow-up recommendations:  Activity:  as tolerated Diet:  heart healthy diet  LORD, JAMISON, NP 02/22/2016, 9:21 AM

## 2016-02-22 NOTE — Consult Note (Signed)
[facility] Face-to-Face Psychiatry Consult   Reason for Consult:  Suicidal ideations Referring Physician:  EDP Patient Identification: [name] MRN:  [id number] Principal Diagnosis: Bipolar I disorder, most recent episode depressed, severe without psychotic features [facility]) Diagnosis:   Patient Active Problem List   Diagnosis Date Noted  . Bipolar I disorder, most recent episode depressed, severe without psychotic features ([place]) [F31.4] 04/22/2012    Priority: High    Class: Acute  . Substance induced mood disorder ([place]) [F19.94] 12/08/2013    Priority: Low  . Cocaine use disorder, moderate, dependence ([place]) [F14.20] 06/29/2012    Priority: Low  . Alcohol use disorder, moderate, dependence ([place]) [F10.20] 06/29/2012    Priority: Low  . Schizoaffective disorder, bipolar type ([place]) [F25.0]   . Balanitis [N48.1] 03/26/2015  . Anxiety [F41.9]     Total Time spent with patient: 30 minutes  Subjective:   [name] is a 55 y.o. male patient does not warrant admission.  HPI:  55 yo male who presented to the ED with passive suicidal ideations and intermittent hallucinations.  He was restarted on medications and stabilized, requesting discharge.  Denies suicidal/homicidal ideations, hallucinations, and alcohol/drug abuse.  Stable for discharge.  Past Psychiatric History: bipolar disorder  Risk to Self: Suicidal Ideation: Yes-Currently Present Suicidal Intent: No-Not Currently/Within Last 6 Months Is patient at risk for suicide?: No Suicidal Plan?:  (cut self ) Access to Means: Yes (access to sharp objects) Specify Access to Suicidal Means:  (sharp objects and knives) What has been your use of drugs/alcohol within the last 12 months?:  (history of polysubstance abuse) How many times?:  (3-4 prior attempts-overdoses, walking in traffic, etc) Other Self Harm Risks:  (cutting ) Triggers for Past Attempts: Other (Comment) (depression, off medications, etc, ) Intentional Self Injurious  Behavior: Cutting Comment - Self Injurious Behavior:  (history of cutting ) Risk to Others: Homicidal Ideation: No Thoughts of Harm to Others: No Current Homicidal Intent: No Current Homicidal Plan: No Access to Homicidal Means: No Identified Victim:  (n/a) History of harm to others?: No Assessment of Violence: None Noted Violent Behavior Description:  (patient is calm and cooperative ) Does patient have access to weapons?: No Criminal Charges Pending?: No Does patient have a court date: No Prior Inpatient Therapy: Prior Inpatient Therapy: Yes Prior Therapy Dates:  (multiple prior admits ) Prior Therapy Facilty/Provider(s):  [facility]) Reason for Treatment:  (suicidal ideations, gestures, attempts, depression) Prior Outpatient Therapy: Prior Outpatient Therapy: Yes Prior Therapy Dates:  (past-[facility] ) Prior Therapy Facilty/Provider(s):  [facility] ) Reason for Treatment:  (medication managment ) Does patient have an ACCT team?: No Does patient have Intensive In-House Services?  : No Does patient have [facility] services? : No Does patient have P4CC services?: No  Past Medical History:  Past Medical History:  Diagnosis Date  . Arthritis   . Balanitis   . Bipolar 1 disorder ([place])   . Depression   . PTSD (post-traumatic stress disorder)   . Schizophrenia ([place])    History reviewed. No pertinent surgical history. Family History: No family history on file. Family Psychiatric  History: none Social History:  History  Alcohol Use  . 0.0 oz/week     History  Drug Use  . Types: Marijuana, Cocaine    Social History   Social History  . Marital status: Single    Spouse name: N/A  . Number of children: N/A  . Years of education: N/A   Social History Main Topics  . Smoking status:  Former Smoker    Packs/day: 0.00    Types: Cigarettes    Quit date: 05/02/2014  . Smokeless tobacco: Never Used  . Alcohol use 0.0 oz/week  . Drug use:     Types: Marijuana, Cocaine  . Sexual  activity: Not Asked   Other Topics Concern  . None   Social History Narrative  . None   Additional Social History:    Allergies:   Allergies  Allergen Reactions  . Lithium Other (See Comments)    caused muscle twitching episodes  . Hydrocodone-Acetaminophen Rash    Pt states he has no allergy to this med    Labs:  Results for orders placed or performed during the hospital encounter of 02/21/16 (from the past 48 hour(s))  Rapid urine drug screen (hospital performed)     Status: None   Collection Time: 02/21/16  8:17 AM  Result Value Ref Range   Opiates NONE DETECTED NONE DETECTED   Cocaine NONE DETECTED NONE DETECTED   Benzodiazepines NONE DETECTED NONE DETECTED   Amphetamines NONE DETECTED NONE DETECTED   Tetrahydrocannabinol NONE DETECTED NONE DETECTED   Barbiturates NONE DETECTED NONE DETECTED    Comment:        DRUG SCREEN FOR MEDICAL PURPOSES ONLY.  IF CONFIRMATION IS NEEDED FOR ANY PURPOSE, NOTIFY LAB WITHIN 5 DAYS.        LOWEST DETECTABLE LIMITS FOR URINE DRUG SCREEN Drug Class       Cutoff (ng/mL) Amphetamine      1000 Barbiturate      200 Benzodiazepine   671 Tricyclics       245 Opiates          300 Cocaine          300 THC              50   Urinalysis, Routine w reflex microscopic (not at [facility])     Status: Abnormal   Collection Time: 02/21/16  8:17 AM  Result Value Ref Range   Color, Urine YELLOW YELLOW   APPearance CLEAR CLEAR   Specific Gravity, Urine 1.018 1.005 - 1.030   pH 6.0 5.0 - 8.0   Glucose, UA NEGATIVE NEGATIVE mg/dL   Hgb urine dipstick NEGATIVE NEGATIVE   Bilirubin Urine NEGATIVE NEGATIVE   Ketones, ur NEGATIVE NEGATIVE mg/dL   Protein, ur NEGATIVE NEGATIVE mg/dL   Nitrite NEGATIVE NEGATIVE   Leukocytes, UA MODERATE (A) NEGATIVE  Urine microscopic-add on     Status: Abnormal   Collection Time: 02/21/16  8:17 AM  Result Value Ref Range   Squamous Epithelial / LPF 0-5 (A) NONE SEEN   WBC, UA 6-30 0 - 5 WBC/hpf   RBC / HPF 0-5 0  - 5 RBC/hpf   Bacteria, UA RARE (A) NONE SEEN   Urine-Other MUCOUS PRESENT   Comprehensive metabolic panel     Status: None   Collection Time: 02/21/16  8:21 AM  Result Value Ref Range   Sodium 138 135 - 145 mmol/L   Potassium 4.1 3.5 - 5.1 mmol/L   Chloride 108 101 - 111 mmol/L   CO2 23 22 - 32 mmol/L   Glucose, Bld 96 65 - 99 mg/dL   BUN 20 6 - 20 mg/dL   Creatinine, Ser 1.02 0.61 - 1.24 mg/dL   Calcium 9.4 8.9 - 10.3 mg/dL   Total Protein 7.5 6.5 - 8.1 g/dL   Albumin 4.5 3.5 - 5.0 g/dL   AST 21 15 - 41 U/L  ALT 17 17 - 63 U/L   Alkaline Phosphatase 62 38 - 126 U/L   Total Bilirubin 0.7 0.3 - 1.2 mg/dL   GFR calc non Af Amer >60 >60 mL/min   GFR calc Af Amer >60 >60 mL/min    Comment: (NOTE) The eGFR has been calculated using the CKD EPI equation. This calculation has not been validated in all clinical situations. eGFR's persistently <60 mL/min signify possible Chronic Kidney Disease.    Anion gap 7 5 - 15  Ethanol     Status: None   Collection Time: 02/21/16  8:21 AM  Result Value Ref Range   Alcohol, Ethyl (B) <5 <5 mg/dL    Comment:        LOWEST DETECTABLE LIMIT FOR SERUM ALCOHOL IS 5 mg/dL FOR MEDICAL PURPOSES ONLY   Salicylate level     Status: None   Collection Time: 02/21/16  8:21 AM  Result Value Ref Range   Salicylate Lvl <6.2 2.8 - 30.0 mg/dL  Acetaminophen level     Status: Abnormal   Collection Time: 02/21/16  8:21 AM  Result Value Ref Range   Acetaminophen (Tylenol), Serum <10 (L) 10 - 30 ug/mL    Comment:        THERAPEUTIC CONCENTRATIONS VARY SIGNIFICANTLY. A RANGE OF 10-30 ug/mL MAY BE AN EFFECTIVE CONCENTRATION FOR MANY PATIENTS. HOWEVER, SOME ARE BEST TREATED AT CONCENTRATIONS OUTSIDE THIS RANGE. ACETAMINOPHEN CONCENTRATIONS >150 ug/mL AT 4 HOURS AFTER INGESTION AND >50 ug/mL AT 12 HOURS AFTER INGESTION ARE OFTEN ASSOCIATED WITH TOXIC REACTIONS.   cbc     Status: None   Collection Time: 02/21/16  8:21 AM  Result Value Ref Range    WBC 4.2 4.0 - 10.5 K/uL   RBC 4.70 4.22 - 5.81 MIL/uL   Hemoglobin 14.3 13.0 - 17.0 g/dL   HCT 44.7 39.0 - 52.0 %   MCV 95.1 78.0 - 100.0 fL   MCH 30.4 26.0 - 34.0 pg   MCHC 32.0 30.0 - 36.0 g/dL   RDW 12.7 11.5 - 15.5 %   Platelets 215 150 - 400 K/uL    Current Facility-Administered Medications  Medication Dose Route Frequency Provider Last Rate Last Dose  . FLUoxetine (PROZAC) capsule 20 mg  20 mg Oral Daily Shanesha Bednarz, MD   20 mg at 02/21/16 1242  . hydrocortisone cream 1 %   Topical BID Montine Circle, PA-C      . hydrOXYzine (ATARAX/VISTARIL) tablet 50 mg  50 mg Oral QHS Corena Pilgrim, MD   50 mg at 02/21/16 2115  . ibuprofen (ADVIL,MOTRIN) tablet 800 mg  800 mg Oral BID PRN Lurena Nida, NP   800 mg at 02/22/16 1308  . lurasidone (LATUDA) tablet 80 mg  80 mg Oral QPM Fate Caster, MD   80 mg at 02/21/16 1827   Current Outpatient Prescriptions  Medication Sig Dispense Refill  . FLUoxetine (PROZAC) 20 MG capsule Take 20 mg by mouth daily.    . hydrocortisone cream 1 % Apply topically 2 (two) times daily.    . hydrOXYzine (ATARAX/VISTARIL) 50 MG tablet Take 1 tablet (50 mg total) by mouth at bedtime. 30 tablet 0  . ibuprofen (ADVIL,MOTRIN) 200 MG tablet Take 400 mg by mouth every 6 (six) hours as needed for moderate pain.    Marland Kitchen lurasidone (LATUDA) 80 MG TABS tablet Take 80 mg by mouth every evening.    . valACYclovir (VALTREX) 500 MG tablet Take 1 tablet (500 mg total) by mouth 2 (two) times  daily. 60 tablet 0    Musculoskeletal: Strength & Muscle Tone: within normal limits Gait & Station: normal Patient leans: N/A  Psychiatric Specialty Exam: Physical Exam  Constitutional: He is oriented to person, place, and time. He appears well-developed and well-nourished.  HENT:  Head: Normocephalic.  Neck: Normal range of motion.  Respiratory: Effort normal.  Musculoskeletal: Normal range of motion.  Neurological: He is alert and oriented to person, place, and time.   Skin: Skin is warm and dry.  Psychiatric: He has a normal mood and affect. His speech is normal and behavior is normal. Judgment and thought content normal. Cognition and memory are normal.    Review of Systems  Constitutional: Negative.   HENT: Negative.   Eyes: Negative.   Respiratory: Negative.   Cardiovascular: Negative.   Gastrointestinal: Negative.   Genitourinary: Negative.   Musculoskeletal: Negative.   Skin: Negative.   Neurological: Negative.   Endo/Heme/Allergies: Negative.   Psychiatric/Behavioral: Negative.     Blood pressure 100/61, pulse 64, temperature 98 F (36.7 C), temperature source Oral, resp. rate 18, height 6' 3"  (1.905 m), weight 98.9 kg (218 lb), SpO2 100 %.Body mass index is 27.25 kg/m.  General Appearance: Casual  Eye Contact:  Good  Speech:  Normal Rate  Volume:  Normal  Mood:  Euthymic  Affect:  Congruent  Thought Process:  Coherent and Descriptions of Associations: Intact  Orientation:  Full (Time, Place, and Person)  Thought Content:  WDL  Suicidal Thoughts:  No  Homicidal Thoughts:  No  Memory:  Immediate;   Good Recent;   Good Remote;   Good  Judgement:  Fair  Insight:  Fair  Psychomotor Activity:  Normal  Concentration:  Concentration: Good and Attention Span: Good  Recall:  Good  Fund of Knowledge:  Good  Language:  Good  Akathisia:  No  Handed:  Right  AIMS (if indicated):     Assets:  Housing Leisure Time Physical Health Resilience Social Support  ADL's:  Intact  Cognition:  WNL  Sleep:        Treatment Plan Summary: Daily contact with patient to assess and evaluate symptoms and progress in treatment, Medication management and Plan schizoaffective disorder, bipolar type:  -Crisis stabilization -Medication management:  Continue his Prozac 20 mg daily for depression, Vistaril 50 mg at bedtime for anxiety, and Latuda 80 mg daily for mood stabilization -Individual counseling  Disposition: No evidence of imminent risk to  self or others at present.    Waylan Boga, NP 02/22/2016 9:05 AM  Patient seen face-to-face for psychiatric evaluation, chart reviewed and case discussed with the physician extender and developed treatment plan. Reviewed the information documented and agree with the treatment plan. Corena Pilgrim, MD

## 2016-02-22 NOTE — BH Assessment (Signed)
[facility] Assessment Progress Note  Per Mojeed Akintayo, MD, this pt does not require psychiatric hospitalization at this time.  Pt is to be discharged from [facility] with referral information for [facility].  This has been included in pt's discharge instructions.  Pt's nurse, Edie, has been notified.  Perry Brassfield, MA Triage Specialist [phone]     

## 2016-02-22 NOTE — Discharge Instructions (Signed)
For your ongoing mental health needs, you are advised to follow up with [facility].  New and returning patients are seen at their walk-in clinic.  Walk-in hours are Monday - Friday from 8:00 am - 3:00 pm.  Walk-in patients are seen on a first come, first served basis.  Try to arrive as early as possible for he best chance of being seen the same day: ° °     [facility] °     [address] °     [place], [place] [address] °     [phone] °

## 2016-02-22 NOTE — ED Notes (Signed)
Pt discharged ambulatory with RX and discharge instructions.  Patient verbalized understanding.  All belongings were returned to pt.

## 2016-04-29 ENCOUNTER — Emergency Department
Admission: EM | Admit: 2016-04-29 | Discharge: 2016-05-01 | Disposition: A | Discharge status: Other Acute Inpatient Hospital | Payer: Medicaid Other | Attending: Emergency Medicine | Admitting: Emergency Medicine

## 2016-04-29 ENCOUNTER — Encounter: Payer: Self-pay | Admitting: Emergency Medicine

## 2016-04-29 DIAGNOSIS — F339 Major depressive disorder, recurrent, unspecified: Secondary | ICD-10-CM | POA: Insufficient documentation

## 2016-04-29 DIAGNOSIS — Z87891 Personal history of nicotine dependence: Secondary | ICD-10-CM | POA: Insufficient documentation

## 2016-04-29 DIAGNOSIS — F1020 Alcohol dependence, uncomplicated: Secondary | ICD-10-CM | POA: Diagnosis present

## 2016-04-29 DIAGNOSIS — Z91199 Patient's noncompliance with other medical treatment and regimen due to unspecified reason: Secondary | ICD-10-CM

## 2016-04-29 DIAGNOSIS — Z79899 Other long term (current) drug therapy: Secondary | ICD-10-CM | POA: Insufficient documentation

## 2016-04-29 DIAGNOSIS — F4310 Post-traumatic stress disorder, unspecified: Secondary | ICD-10-CM | POA: Insufficient documentation

## 2016-04-29 DIAGNOSIS — F1420 Cocaine dependence, uncomplicated: Secondary | ICD-10-CM | POA: Diagnosis present

## 2016-04-29 DIAGNOSIS — F250 Schizoaffective disorder, bipolar type: Secondary | ICD-10-CM | POA: Diagnosis present

## 2016-04-29 DIAGNOSIS — R45851 Suicidal ideations: Secondary | ICD-10-CM

## 2016-04-29 DIAGNOSIS — Z9119 Patient's noncompliance with other medical treatment and regimen: Secondary | ICD-10-CM

## 2016-04-29 LAB — COMPREHENSIVE METABOLIC PANEL
ALBUMIN: 4.6 g/dL (ref 3.5–5.0)
ALK PHOS: 74 U/L (ref 38–126)
ALT: 18 U/L (ref 17–63)
AST: 24 U/L (ref 15–41)
Anion gap: 7 (ref 5–15)
BILIRUBIN TOTAL: 0.6 mg/dL (ref 0.3–1.2)
BUN: 22 mg/dL — AB (ref 6–20)
CALCIUM: 9.7 mg/dL (ref 8.9–10.3)
CO2: 31 mmol/L (ref 22–32)
Chloride: 102 mmol/L (ref 101–111)
Creatinine, Ser: 1.09 mg/dL (ref 0.61–1.24)
GFR calc Af Amer: >60 mL/min (ref >60)
GFR calc non Af Amer: >60 mL/min (ref >60)
GLUCOSE: 101 mg/dL — AB (ref 65–99)
Potassium: 4 mmol/L (ref 3.5–5.1)
Sodium: 140 mmol/L (ref 135–145)
TOTAL PROTEIN: 8.5 g/dL — AB (ref 6.5–8.1)

## 2016-04-29 LAB — URINE DRUG SCREEN, QUALITATIVE (ARMC ONLY)
AMPHETAMINES, UR SCREEN: NOT DETECTED
BENZODIAZEPINE, UR SCRN: NOT DETECTED
Barbiturates, Ur Screen: NOT DETECTED
COCAINE METABOLITE, UR ~~LOC~~: POSITIVE — AB
Cannabinoid 50 Ng, Ur ~~LOC~~: NOT DETECTED
MDMA (Ecstasy)Ur Screen: NOT DETECTED
Methadone Scn, Ur: NOT DETECTED
OPIATE, UR SCREEN: NOT DETECTED
PHENCYCLIDINE (PCP) UR S: NOT DETECTED
Tricyclic, Ur Screen: NOT DETECTED

## 2016-04-29 LAB — CBC
HEMATOCRIT: 48.1 % (ref 40.0–52.0)
HEMOGLOBIN: 16 g/dL (ref 13.0–18.0)
MCH: 30.5 pg (ref 26.0–34.0)
MCHC: 33.2 g/dL (ref 32.0–36.0)
MCV: 91.8 fL (ref 80.0–100.0)
Platelets: 215 10*3/uL (ref 150–440)
RBC: 5.24 MIL/uL (ref 4.40–5.90)
RDW: 12.5 % (ref 11.5–14.5)
WBC: 7.1 10*3/uL (ref 3.8–10.6)

## 2016-04-29 LAB — ACETAMINOPHEN LEVEL: Acetaminophen (Tylenol), Serum: <10 ug/mL — ABNORMAL LOW (ref 10–30)

## 2016-04-29 LAB — ETHANOL: Alcohol, Ethyl (B): <5 mg/dL (ref <5)

## 2016-04-29 LAB — SALICYLATE LEVEL: Salicylate Lvl: <7 mg/dL (ref 2.8–30.0)

## 2016-04-29 NOTE — ED Notes (Signed)
No meds in 2 weeks pt states doent work for him.

## 2016-04-29 NOTE — ED Notes (Signed)

## 2016-04-29 NOTE — ED Notes (Signed)
Pt. To BHU from ED ambulatory without difficulty, to room  B[facility]. [facility] Pt. Is alert and oriented, warm and dry in no distress. Pt. Denies HI. Pt reports having SI with plan to cut self. Patient reports hearing voices to hurt self. Patient verbally contracts for safety with this Clinical research associatewriter. Pt. Calm and cooperative. Pt. Made aware of security cameras and Q15 minute rounds. Pt. Encouraged to let Nursing staff know of any concerns or needs.

## 2016-04-29 NOTE — ED Notes (Signed)
ED BHU PLACEMENT JUSTIFICATION Is the patient under IVC or is there intent for IVC: No. Is the patient medically cleared: Yes.   Is there vacancy in the ED BHU: Yes.   Is the population mix appropriate for patient: Yes.   Is the patient awaiting placement in inpatient or outpatient setting: No. Has the patient had a psychiatric consult: No. Survey of unit performed for contraband, proper placement and condition of furniture, tampering with fixtures in bathroom, shower, and each patient room: Yes.  ; Findings: NA APPEARANCE/BEHAVIOR calm and cooperative NEURO ASSESSMENT Orientation: time, place and person Hallucinations: Yes.  Auditory Hallucinations Speech: Normal Gait: normal RESPIRATORY ASSESSMENT Normal expansion.  Clear to auscultation.  No rales, rhonchi, or wheezing. CARDIOVASCULAR ASSESSMENT regular rate and rhythm, S1, S2 normal, no murmur, click, rub or gallop GASTROINTESTINAL ASSESSMENT soft, nontender, BS WNL, no r/g EXTREMITIES normal strength, tone, and muscle mass PLAN OF CARE Provide calm/safe environment. Vital signs assessed twice daily. ED BHU Assessment once each 12-hour shift. Collaborate with intake RN daily or as condition indicates. Assure the ED provider has rounded once each shift. Provide and encourage hygiene. Provide redirection as needed. Assess for escalating behavior; address immediately and inform ED provider.  Assess family dynamic and appropriateness for visitation as needed: Yes.  ; If necessary, describe findings: NA Educate the patient/family about BHU procedures/visitation: Yes.  ; If necessary, describe findings: NA  

## 2016-04-29 NOTE — ED Notes (Signed)
Spoke with EDP regarding pt going to BHU.  EDP approved pt move.

## 2016-04-29 NOTE — BH Assessment (Signed)
Assessment Note  [name] is an 55 y.o. male.  Patient was brought into the ED by his brother because of unstable symptoms of schizophrenia, hallucinations with command to kill self, and issues with medications.  Patient reports current suicidal thoughts with command hallucinations to kill self.  Patient denies homicidal thoughts.  He reports non-compliance with medications for many years and not following up with outpatient services.  He reports being diagnosed with Schizophrenia and PTSD since he was 55 years old and has had multiple hospitalizations.  In the past 2 months he was discharged from [facility] because a suicide attempt.  "I have been on so many meds since I was in my 20's and nothing works anymore".    Patient is currently requesting ECT treatment.  Patient believes a different form of treatment for his mental health symptoms and he don't think medications work.      Diagnosis: Schizophrenia  Past Medical History:  Past Medical History:  Diagnosis Date  . Arthritis   . Balanitis   . Bipolar 1 disorder ([facility])   . Depression   . PTSD (post-traumatic stress disorder)   . Schizophrenia ([facility])     History reviewed. No pertinent surgical history.  Family History: No family history on file.  Social History:  reports that he quit smoking about 1 years ago. His smoking use included Cigarettes. He smoked 0.00 packs per day. He has never used smokeless tobacco. He reports that he drinks alcohol. He reports that he uses drugs, including Marijuana and Cocaine.  Additional Social History:  Alcohol / Drug Use Pain Medications: see chart Prescriptions: see chart Over the Counter: see chart History of alcohol / drug use?: Yes Longest period of sobriety (when/how long): couple months Negative Consequences of Use: Personal relationships, Financial Substance #1 Name of Substance 1: Alcohol 1 - Age of First Use: 17 1 - Amount (size/oz): binge 1 - Frequency: binge 1 -  Duration: ongoing 1 - Last Use / Amount: 4-5days ago Substance #2 Name of Substance 2: cocaine 2 - Age of First Use: 20s 2 - Amount (size/oz): binge 2 - Frequency: binge 2 - Duration: ongoing  2 - Last Use / Amount: 3 days ago  CIWA: CIWA-Ar BP: 104/65 Pulse Rate: 100 Nausea and Vomiting: no nausea and no vomiting Tactile Disturbances: none Tremor: no tremor Auditory Disturbances: not present Paroxysmal Sweats: no sweat visible Visual Disturbances: not present Anxiety: mildly anxious Headache, Fullness in Head: none present Agitation: normal activity Orientation and Clouding of Sensorium: oriented and can do serial additions CIWA-Ar Total: 1 COWS:    Allergies:  Allergies  Allergen Reactions  . Lithium Other (See Comments)    caused muscle twitching episodes  . Hydrocodone-Acetaminophen Rash    Pt states he has no allergy to this med    Home Medications:  (Not in a hospital admission)  OB/GYN Status:  No LMP for male patient.  General Assessment Data Location of Assessment: [facility] ED TTS Assessment: In system Is this a Tele or Face-to-Face Assessment?: Face-to-Face Is this an Initial Assessment or a Re-assessment for this encounter?: Initial Assessment Marital status: Single Maiden name: na Is patient pregnant?: No Pregnancy Status: No Living Arrangements: Other relatives (brother) Can pt return to current living arrangement?: Yes Admission Status: Voluntary Is patient capable of signing voluntary admission?: Yes Referral Source: Self/Family/Friend Insurance type: MCD  Medical Screening Exam [facility] Walk-in ONLY) Medical Exam completed: Yes  Crisis Care Plan Living Arrangements: Other relatives (brother) Name of  Psychiatrist: none reported Name of Therapist: none reported  Education Status Is patient currently in school?: No Current Grade: na Highest grade of school patient has completed: none report Name of school: na Contact person: na  Risk to self  with the past 6 months Suicidal Ideation: Yes-Currently Present Has patient been a risk to self within the past 6 months prior to admission? : Yes Suicidal Intent: Yes-Currently Present Has patient had any suicidal intent within the past 6 months prior to admission? : Yes Is patient at risk for suicide?: Yes Suicidal Plan?: Yes-Currently Present Has patient had any suicidal plan within the past 6 months prior to admission? : Yes Specify Current Suicidal Plan: voices telling me to get a gun, knife, cut self Access to Means: Yes Specify Access to Suicidal Means: access to gun and other sharpe items ("I can get a gun if I want") What has been your use of drugs/alcohol within the last 12 months?: alcohol, cocaine Previous Attempts/Gestures: Yes How many times?: 4 Other Self Harm Risks: cutting Triggers for Past Attempts: Unpredictable, Hallucinations (voices telling to cut self, get a gun, ) Intentional Self Injurious Behavior: Cutting Comment - Self Injurious Behavior: hx cutting face, and legs Family Suicide History: Unknown Recent stressful life event(s): Conflict (Comment), Trauma (Comment), Other (Comment) (hallucinations) Persecutory voices/beliefs?: Yes Depression: Yes Depression Symptoms: Isolating, Guilt, Loss of interest in usual pleasures, Feeling worthless/self pity, Feeling angry/irritable (hopelessness) Substance abuse history and/or treatment for substance abuse?: Yes  Risk to Others within the past 6 months Homicidal Ideation: No-Not Currently/Within Last 6 Months Does patient have any lifetime risk of violence toward others beyond the six months prior to admission? : No Thoughts of Harm to Others: No-Not Currently Present/Within Last 6 Months Current Homicidal Intent: No-Not Currently/Within Last 6 Months Current Homicidal Plan: No-Not Currently/Within Last 6 Months Access to Homicidal Means: No Identified Victim: none reported History of harm to others?: No Assessment  of Violence: None Noted Violent Behavior Description: none reported Does patient have access to weapons?: Yes (Comment) Criminal Charges Pending?: No Does patient have a court date: No Is patient on probation?: No  Psychosis Hallucinations: With command, Auditory (to get gun, knife, cut self, lay in traffics) Delusions: Unspecified  Mental Status Report Appearance/Hygiene: In scrubs Eye Contact: Fair Motor Activity: Freedom of movement Speech: Pressured Level of Consciousness: Restless, Alert Mood: Anxious, Labile Affect: Anxious Anxiety Level: Moderate Thought Processes: Relevant, Circumstantial Judgement: Impaired Orientation: Person, Place, Situation Obsessive Compulsive Thoughts/Behaviors: None  Cognitive Functioning Concentration: Poor Memory: Recent Intact, Remote Intact IQ: Average Insight: Poor Impulse Control: Fair Appetite: Fair Weight Loss: 0 Weight Gain: 0 Sleep: No Change Total Hours of Sleep: 6 Vegetative Symptoms: None  ADLScreening [facility] Assessment Services) Patient's cognitive ability adequate to safely complete daily activities?: Yes Patient able to express need for assistance with ADLs?: Yes Independently performs ADLs?: Yes (appropriate for developmental age)  Prior Inpatient Therapy Prior Inpatient Therapy: Yes Prior Therapy Dates: 2016,2017 [facility] Reason for Treatment: Schizophrenia, PTSD  Prior Outpatient Therapy Prior Outpatient Therapy: No Prior Therapy Dates: none Prior Therapy Facilty/Provider(s): none Does patient have an ACCT team?: No Does patient have Intensive In-House Services?  : No Does patient have [facility] services? : No Does patient have P4CC services?: No  ADL Screening (condition at time of admission) Patient's cognitive ability adequate to safely complete daily activities?: Yes Patient able to express need for assistance with ADLs?: Yes Independently performs ADLs?: Yes  (appropriate for developmental age)  Abuse/Neglect Assessment (Assessment to be complete while patient is alone) Physical Abuse: Denies Verbal Abuse: Denies Sexual Abuse: Denies Exploitation of patient/patient's resources: Denies Self-Neglect: Denies Values / Beliefs Cultural Requests During Hospitalization: None Spiritual Requests During Hospitalization: None Consults Spiritual Care Consult Needed: No Social Work Consult Needed: No      Additional Information 1:1 In Past 12 Months?: No CIRT Risk: No Elopement Risk: No Does patient have medical clearance?: Yes     Disposition:  Disposition Initial Assessment Completed for this Encounter: Yes Disposition of Patient: Other dispositions (pending disposition) Other disposition(s): Other (Comment) (pending disposition)  On Site Evaluation by:   Reviewed with Physician:    Maryelizabeth Rowanorbett, Glenn Christo A 04/29/2016 10:12 PM

## 2016-04-29 NOTE — ED Provider Notes (Addendum)
[facility] Emergency Department Provider Note        Time seen: ----------------------------------------- 7:37 PM on 04/29/2016 -----------------------------------------    I have reviewed the triage vital signs and the nursing notes.   HISTORY  Chief Complaint Suicidal    HPI [name] is a 55 y.o. male who presents to ER for suicidal thoughts. Patient has had an active plan to harm himself. Patient thinks his medications are not working anymore, he's had schizophrenia and PTSD for many years he states. His brother brought him here to get treatment and he agrees that he needs help.   Past Medical History:  Diagnosis Date  . Arthritis   . Balanitis   . Bipolar 1 disorder (HCC)   . Depression   . PTSD (post-traumatic stress disorder)   . Schizophrenia [facility](HCC)     Patient Active Problem List   Diagnosis Date Noted  . Schizoaffective disorder, bipolar type (HCC)   . Balanitis 03/26/2015  . Anxiety   . Substance induced mood disorder (HCC) 12/08/2013  . Cocaine use disorder, moderate, dependence (HCC) 06/29/2012  . Alcohol use disorder, moderate, dependence (HCC) 06/29/2012  . Bipolar I disorder, most recent episode depressed, severe without psychotic features (HCC) 04/22/2012    Class: Acute    History reviewed. No pertinent surgical history.  Allergies Lithium and Hydrocodone-acetaminophen  Social History Social History  Substance Use Topics  . Smoking status: Former Smoker    Packs/day: 0.00    Types: Cigarettes    Quit date: 05/02/2014  . Smokeless tobacco: Never Used  . Alcohol use 0.0 oz/week    Review of Systems Constitutional: Negative for fever. Cardiovascular: Negative for chest pain. Respiratory: Negative for shortness of breath. Gastrointestinal: Negative for abdominal pain, vomiting and diarrhea. Skin: Negative for rash. Neurological: Negative for headaches, focal weakness or numbness. Psychiatric: Positive for  depression, suicidal thought  10-point ROS otherwise negative.  ____________________________________________   PHYSICAL EXAM:  VITAL SIGNS: ED Triage Vitals  Enc Vitals Group     BP 04/29/16 1845 104/65     Pulse Rate 04/29/16 1845 100     Resp 04/29/16 1845 16     Temp 04/29/16 1845 98 F (36.7 C)     Temp Source 04/29/16 1845 Oral     SpO2 04/29/16 1845 99 %     Weight 04/29/16 1846 220 lb (99.8 kg)     Height 04/29/16 1846 6\' 3"  (1.905 m)     Head Circumference --      Peak Flow --      Pain Score --      Pain Loc --      Pain Edu? --      Excl. in GC? --     Constitutional: Alert and oriented. Well appearing and in no distress. Eyes: Conjunctivae are normal. PERRL. Normal extraocular movements. ENT   Head: Normocephalic and atraumatic.   Nose: No congestion/rhinnorhea.   Mouth/Throat: Mucous membranes are moist.   Neck: No stridor. Cardiovascular: Normal rate, regular rhythm. No murmurs, rubs, or gallops. Respiratory: Normal respiratory effort without tachypnea nor retractions. Breath sounds are clear and equal bilaterally. No wheezes/rales/rhonchi. Gastrointestinal: Soft and nontender. Normal bowel sounds Musculoskeletal: Nontender with normal range of motion in all extremities. No lower extremity tenderness nor edema. Neurologic:  Normal speech and language. No gross focal neurologic deficits are appreciated.  Skin:  Skin is warm, dry and intact. No rash noted. Psychiatric:Patient is depressed with suicidal ideation ____________________________________________  ED COURSE:  Pertinent labs & imaging results that were available during my care of the patient were reviewed by me and considered in my medical decision making (see chart for details). Clinical Course   Patient presents to the ER no distress, is suicidal and does need psychiatric inpatient admission.  Procedures ____________________________________________   LABS (pertinent  positives/negatives)  Labs Reviewed  COMPREHENSIVE METABOLIC PANEL - Abnormal; Notable for the following:       Result Value   Glucose, Bld 101 (*)    BUN 22 (*)    Total Protein 8.5 (*)    All other components within normal limits  ACETAMINOPHEN LEVEL - Abnormal; Notable for the following:    Acetaminophen (Tylenol), Serum <10 (*)    All other components within normal limits  URINE DRUG SCREEN, QUALITATIVE ([facility] ONLY) - Abnormal; Notable for the following:    Cocaine Metabolite,Ur [place] POSITIVE (*)    All other components within normal limits  ETHANOL  SALICYLATE LEVEL  CBC   ____________________________________________  FINAL ASSESSMENT AND PLAN  Depression, suicidal ideation  Plan: Patient with labs as dictated above. Patient does require inpatient psychiatric evaluation, he is medically stable for psychiatric disposition.   Emily FilbertWilliams, Monte Bronder E, MD   Note: This dictation was prepared with Dragon dictation. Any transcriptional errors that result from this process are unintentional    Emily FilbertJonathan E Talia Hoheisel, MD 04/29/16 [id number]    Emily FilbertJonathan E Latora Quarry, MD 04/29/16 2022

## 2016-04-29 NOTE — ED Triage Notes (Signed)
Pt presents with suicidal thoughts. States he thinks his meds are not working anymore.

## 2016-04-30 DIAGNOSIS — F250 Schizoaffective disorder, bipolar type: Secondary | ICD-10-CM

## 2016-04-30 DIAGNOSIS — Z91199 Patient's noncompliance with other medical treatment and regimen due to unspecified reason: Secondary | ICD-10-CM

## 2016-04-30 DIAGNOSIS — Z9119 Patient's noncompliance with other medical treatment and regimen: Secondary | ICD-10-CM

## 2016-04-30 MED ORDER — LITHIUM CARBONATE 300 MG PO CAPS
600.0000 mg | ORAL_CAPSULE | Freq: Two times a day (BID) | ORAL | Status: DC
  Administered 2016-04-30 – 2016-05-01 (×2): 600 mg via ORAL
  Filled 2016-04-30 (×2): qty 2

## 2016-04-30 MED ORDER — OLANZAPINE 10 MG PO TABS
10.0000 mg | ORAL_TABLET | Freq: Every day | ORAL | Status: DC
  Administered 2016-04-30: 10 mg via ORAL
  Filled 2016-04-30: qty 1

## 2016-04-30 MED ORDER — HYDROXYZINE HCL 25 MG PO TABS
50.0000 mg | ORAL_TABLET | Freq: Four times a day (QID) | ORAL | Status: DC | PRN
  Administered 2016-04-30: 50 mg via ORAL
  Filled 2016-04-30: qty 2

## 2016-04-30 NOTE — ED Notes (Addendum)
Pt not agreeable to being referred to surrounding facilities. Pt states he would prefer Dr. Toni Amendlapacs be his doctor because he trusts him. "He (Dr. Blair Haileylapcs) wants to help me."  Pt requesting to speak with Dr. Toni Amendlapacs. Doctor made aware and will be back to speak with the pt. Pt has elevated anxiety, but remains cooperative. Maintained on 15 minute checks and observation by security camera for safety.

## 2016-04-30 NOTE — ED Notes (Signed)
Pt. Alert and oriented, warm and dry, in no distress. Pt. Denies HI. Pt reports still having SI with plan to slit his throat and still hearing voices telling him to hurt himself. Patient verbally contracts for safety with this Clinical research associatewriter.  Pt. Encouraged to let nursing staff know of any concerns or needs.

## 2016-04-30 NOTE — ED Notes (Signed)

## 2016-04-30 NOTE — ED Notes (Signed)
Psychiatrist is at bedside. No distress noted. Maintained on 15 minute checks and observation by security camera for safety. 

## 2016-04-30 NOTE — ED Notes (Signed)
Patient resting quietly in room. No noted distress or abnormal behaviors noted. Will continue 15 minute checks and observation by security camera for safety. 

## 2016-04-30 NOTE — ED Notes (Signed)
Pt asking when he will be transferred to BMU. RN explained to pt it should be today, but that is subject to change. Pt accepting. Pt remains pleasant, calm and cooperative. VS taken. No further concerns voiced. Maintained on 15 minute checks and observation by security camera for safety.

## 2016-04-30 NOTE — ED Provider Notes (Signed)
-----------------------------------------   8:14 AM on 04/30/2016 -----------------------------------------   Blood pressure 112/75, pulse 81, temperature 98.4 F (36.9 C), temperature source Oral, resp. rate 18, height 6\' 3"  (1.905 m), weight 220 lb (99.8 kg), SpO2 98 %.  The patient had no acute events since last update.  Calm and cooperative at this time.  Disposition is pending Psychiatry/Behavioral Medicine team recommendations.     Jeanmarie PlantJames A Zoella Roberti, MD 04/30/16 [phone]

## 2016-04-30 NOTE — ED Notes (Signed)
ED BHU PLACEMENT JUSTIFICATION Is the patient under IVC or is there intent for IVC: No. Is the patient medically cleared: Yes.   Is there vacancy in the ED BHU: Yes.   Is the population mix appropriate for patient: Yes.   Is the patient awaiting placement in inpatient or outpatient setting: Yes.   Has the patient had a psychiatric consult: Yes.   Survey of unit performed for contraband, proper placement and condition of furniture, tampering with fixtures in bathroom, shower, and each patient room: Yes.  ; Findings: NA APPEARANCE/BEHAVIOR calm, cooperative and adequate rapport can be established NEURO ASSESSMENT Orientation: time, place and person Hallucinations: Yes.  Auditory Hallucinations Speech: Normal Gait: normal RESPIRATORY ASSESSMENT Normal expansion.  Clear to auscultation.  No rales, rhonchi, or wheezing. CARDIOVASCULAR ASSESSMENT regular rate and rhythm, S1, S2 normal, no murmur, click, rub or gallop GASTROINTESTINAL ASSESSMENT soft, nontender, BS WNL, no r/g EXTREMITIES normal strength, tone, and muscle mass PLAN OF CARE Provide calm/safe environment. Vital signs assessed twice daily. ED BHU Assessment once each 12-hour shift. Collaborate with intake RN daily or as condition indicates. Assure the ED provider has rounded once each shift. Provide and encourage hygiene. Provide redirection as needed. Assess for escalating behavior; address immediately and inform ED provider.  Assess family dynamic and appropriateness for visitation as needed: Yes.  ; If necessary, describe findings: NA Educate the patient/family about BHU procedures/visitation: Yes.  ; If necessary, describe findings: NA  

## 2016-04-30 NOTE — Progress Notes (Signed)
Per Dr. Toni Amendlapacs, pt may remain overnight and re-eval in a.m. And pt able to wait in BHU until bed becomes available. Codie Krogh K. Sherlon HandingHarris, LCAS-A, LPC-A, [facility] Center[facility]  Counselor 04/30/2016 5:07 PM

## 2016-04-30 NOTE — ED Notes (Signed)
Pt calm, cooperative. Pt endorses SI and AH. Pt has interest in receiving ECT. RN will inform psychiatrist.  Pt states he feels his medications are no longer helping him. Pt wants to try new medications. No further concerns voiced. Maintained on 15 minute checks and observation by security camera for safety.

## 2016-04-30 NOTE — Consult Note (Signed)
[facility] Face-to-Face Psychiatry Consult   Reason for Consult:  Consult for 55 year old man with a history of mental illness who presents requesting to be in the hospital Referring Physician:  Mc[place]ane Patient Identification: [name] MRN:  [id number] Principal Diagnosis: Schizoaffective disorder, bipolar type [facility]) Diagnosis:   Patient Active Problem List   Diagnosis Date Noted  . Noncompliance [Z91.19] 04/30/2016  . Schizoaffective disorder, bipolar type ([place]) [F25.0]   . Balanitis [N48.1] 03/26/2015  . Anxiety [F41.9]   . Substance induced mood disorder ([place]) [F19.94] 12/08/2013  . Cocaine use disorder, moderate, dependence ([place]) [F14.20] 06/29/2012  . Alcohol use disorder, moderate, dependence ([place]) [F10.20] 06/29/2012  . Bipolar I disorder, most recent episode depressed, severe without psychotic features ([place]) [F31.4] 04/22/2012    Class: Acute    Total Time spent with patient: 1 hour  Subjective:   [name] is a 55 y.o. male patient admitted with "I just need help I just need help".  HPI:  55 year old man with a history of chronic mental illness. Patient presents as very agitated although not at all hostile. He tells me several times up front that he desperately needs help because his mood is so bad. He says recently his mood has been feeling depressed and down. He hasn't been able to sleep for a couple weeks. He feels negative all the time. He is having auditory hallucinations that occur almost constantly. He has had suicidal thoughts and claims that yesterday he was trying to find his sister's gun in order to kill himself. Patient says that he is not using drugs and minimizes his alcohol use although his drug screen is positive for cocaine. He says that he stopped taking all of his psychiatric medicines a couple weeks ago. His excuse is that they weren't working although it sounds like he is doing much worse since being off of them. Patient asked me several times if he could  have ECT.  Social history: Most recently has been staying with his sister here in town. Prior to that he was in [place] for a little while and before that was with his brother in [place]. Last hospitalization was at [facility] during the summer. Remarkably the patient does not get disability and says that he does not think he has Medicare or Medicaid. Seems to rely on assistance from his family.  Medical history: Relatively good health. Has chronic arthritis in his knees. Also has herpes infection that is chronic but only occasionally flares up and needs medicine.  Substance abuse history: Patient has an established history of alcohol and cocaine abuse which usually is involved it seems in triggering the really bad exacerbations of his symptoms.  Past Psychiatric History: Patient carries diagnoses of schizophrenia schizoaffective disorder bipolar disorder and PTSD. Apparently the PTSD is related to some kind of traumatic incident that happened to him when he was about 55 years old. The overall pattern sounds like schizoaffective disorder with substance abuse issues though. He says he's been on a large number of medicines. The most recent combination was Lamictal and Latuda but he stopped taking those a couple weeks ago. He was also taking Zyprexa. Patient claims that medicines have not been helpful although the chart would suggest that he does much better when he is on his medicine. He tells me the most helpful thing is ever taken in his life was lithium. He says that he has tried to kill himself multiple times in the course of his life. Denies any history of violence to  others.  Risk to Self: Suicidal Ideation: Yes-Currently Present Suicidal Intent: Yes-Currently Present Is patient at risk for suicide?: Yes Suicidal Plan?: Yes-Currently Present Specify Current Suicidal Plan: voices telling me to get a gun, knife, cut self Access to Means: Yes Specify Access to Suicidal Means: access to gun and other  sharpe items ("I can get a gun if I want") What has been your use of drugs/alcohol within the last 12 months?: alcohol, cocaine How many times?: 4 Other Self Harm Risks: cutting Triggers for Past Attempts: Unpredictable, Hallucinations (voices telling to cut self, get a gun, ) Intentional Self Injurious Behavior: Cutting Comment - Self Injurious Behavior: hx cutting face, and legs Risk to Others: Homicidal Ideation: No-Not Currently/Within Last 6 Months Thoughts of Harm to Others: No-Not Currently Present/Within Last 6 Months Current Homicidal Intent: No-Not Currently/Within Last 6 Months Current Homicidal Plan: No-Not Currently/Within Last 6 Months Access to Homicidal Means: No Identified Victim: none reported History of harm to others?: No Assessment of Violence: None Noted Violent Behavior Description: none reported Does patient have access to weapons?: Yes (Comment) Criminal Charges Pending?: No Does patient have a court date: No Prior Inpatient Therapy: Prior Inpatient Therapy: Yes Prior Therapy Dates: 2016,2017 Prior Therapy Facilty/Provider(s): [facility] C[facility], [facility], [facility] Reason for Treatment: Schizophrenia, PTSD Prior Outpatient Therapy: Prior Outpatient Therapy: No Prior Therapy Dates: none Prior Therapy Facilty/Provider(s): none Does patient have an ACCT team?: No Does patient have Intensive In-House Services?  : No Does patient have [facility] services? : No Does patient have P4CC services?: No  Past Medical History:  Past Medical History:  Diagnosis Date  . Arthritis   . Balanitis   . Bipolar 1 disorder ([place])   . Depression   . PTSD (post-traumatic stress disorder)   . Schizophrenia ([place])    History reviewed. No pertinent surgical history. Family History: No family history on file. Family Psychiatric  History: Patient does not know of any family history of mental illness Social History:  History  Alcohol Use  . 0.0 oz/week     History  Drug Use  . Types: Marijuana,  Cocaine    Social History   Social History  . Marital status: Single    Spouse name: N/A  . Number of children: N/A  . Years of education: N/A   Social History Main Topics  . Smoking status: Former Smoker    Packs/day: 0.00    Types: Cigarettes    Quit date: 05/02/2014  . Smokeless tobacco: Never Used  . Alcohol use 0.0 oz/week  . Drug use:     Types: Marijuana, Cocaine  . Sexual activity: Not Asked   Other Topics Concern  . None   Social History Narrative  . None   Additional Social History:    Allergies:   Allergies  Allergen Reactions  . Lithium Other (See Comments)    caused muscle twitching episodes  . Hydrocodone-Acetaminophen Rash    Pt states he has no allergy to this med    Labs:  Results for orders placed or performed during the hospital encounter of 04/29/16 (from the past 48 hour(s))  Comprehensive metabolic panel     Status: Abnormal   Collection Time: 04/29/16  6:47 PM  Result Value Ref Range   Sodium 140 135 - 145 mmol/L   Potassium 4.0 3.5 - 5.1 mmol/L   Chloride 102 101 - 111 mmol/L   CO2 31 22 - 32 mmol/L   Glucose, Bld 101 (H) 65 - 99 mg/dL   BUN 22 (  H) 6 - 20 mg/dL   Creatinine, Ser 1.09 0.61 - 1.24 mg/dL   Calcium 9.7 8.9 - 10.3 mg/dL   Total Protein 8.5 (H) 6.5 - 8.1 g/dL   Albumin 4.6 3.5 - 5.0 g/dL   AST 24 15 - 41 U/L   ALT 18 17 - 63 U/L   Alkaline Phosphatase 74 38 - 126 U/L   Total Bilirubin 0.6 0.3 - 1.2 mg/dL   GFR calc non Af Amer >60 >60 mL/min   GFR calc Af Amer >60 >60 mL/min    Comment: (NOTE) The eGFR has been calculated using the CKD EPI equation. This calculation has not been validated in all clinical situations. eGFR's persistently <60 mL/min signify possible Chronic Kidney Disease.    Anion gap 7 5 - 15  Ethanol     Status: None   Collection Time: 04/29/16  6:47 PM  Result Value Ref Range   Alcohol, Ethyl (B) <5 <5 mg/dL    Comment:        LOWEST DETECTABLE LIMIT FOR SERUM ALCOHOL IS 5 mg/dL FOR MEDICAL  PURPOSES ONLY   Salicylate level     Status: None   Collection Time: 04/29/16  6:47 PM  Result Value Ref Range   Salicylate Lvl <0.0 2.8 - 30.0 mg/dL  Acetaminophen level     Status: Abnormal   Collection Time: 04/29/16  6:47 PM  Result Value Ref Range   Acetaminophen (Tylenol), Serum <10 (L) 10 - 30 ug/mL    Comment:        THERAPEUTIC CONCENTRATIONS VARY SIGNIFICANTLY. A RANGE OF 10-30 ug/mL MAY BE AN EFFECTIVE CONCENTRATION FOR MANY PATIENTS. HOWEVER, SOME ARE BEST TREATED AT CONCENTRATIONS OUTSIDE THIS RANGE. ACETAMINOPHEN CONCENTRATIONS >150 ug/mL AT 4 HOURS AFTER INGESTION AND >50 ug/mL AT 12 HOURS AFTER INGESTION ARE OFTEN ASSOCIATED WITH TOXIC REACTIONS.   cbc     Status: None   Collection Time: 04/29/16  6:47 PM  Result Value Ref Range   WBC 7.1 3.8 - 10.6 K/uL   RBC 5.24 4.40 - 5.90 MIL/uL   Hemoglobin 16.0 13.0 - 18.0 g/dL   HCT 48.1 40.0 - 52.0 %   MCV 91.8 80.0 - 100.0 fL   MCH 30.5 26.0 - 34.0 pg   MCHC 33.2 32.0 - 36.0 g/dL   RDW 12.5 11.5 - 14.5 %   Platelets 215 150 - 440 K/uL  Urine Drug Screen, Qualitative     Status: Abnormal   Collection Time: 04/29/16  6:47 PM  Result Value Ref Range   Tricyclic, Ur Screen NONE DETECTED NONE DETECTED   Amphetamines, Ur Screen NONE DETECTED NONE DETECTED   MDMA (Ecstasy)Ur Screen NONE DETECTED NONE DETECTED   Cocaine Metabolite,Ur [place] POSITIVE (A) NONE DETECTED   Opiate, Ur Screen NONE DETECTED NONE DETECTED   Phencyclidine (PCP) Ur S NONE DETECTED NONE DETECTED   Cannabinoid 50 Ng, Ur [place] NONE DETECTED NONE DETECTED   Barbiturates, Ur Screen NONE DETECTED NONE DETECTED   Benzodiazepine, Ur Scrn NONE DETECTED NONE DETECTED   Methadone Scn, Ur NONE DETECTED NONE DETECTED    Comment: (NOTE) 938  Tricyclics, urine               Cutoff 1000 ng/mL 200  Amphetamines, urine             Cutoff 1000 ng/mL 300  MDMA (Ecstasy), urine           Cutoff 500 ng/mL 400  Cocaine Metabolite, urine  Cutoff 300 ng/mL 500   Opiate, urine                   Cutoff 300 ng/mL 600  Phencyclidine (PCP), urine      Cutoff 25 ng/mL 700  Cannabinoid, urine              Cutoff 50 ng/mL 800  Barbiturates, urine             Cutoff 200 ng/mL 900  Benzodiazepine, urine           Cutoff 200 ng/mL 1000 Methadone, urine                Cutoff 300 ng/mL 1100 1200 The urine drug screen provides only a preliminary, unconfirmed 1300 analytical test result and should not be used for non-medical 1400 purposes. Clinical consideration and professional judgment should 1500 be applied to any positive drug screen result due to possible 1600 interfering substances. A more specific alternate chemical method 1700 must be used in order to obtain a confirmed analytical result.  1800 Gas chromato graphy / mass spectrometry (GC/MS) is the preferred 1900 confirmatory method.     Current Facility-Administered Medications  Medication Dose Route Frequency Provider Last Rate Last Dose  . lithium carbonate capsule 600 mg  600 mg Oral BID WC Gonzella Lex, MD       Current Outpatient Prescriptions  Medication Sig Dispense Refill  . FLUoxetine (PROZAC) 20 MG capsule Take 1 capsule (20 mg total) by mouth daily. (Patient not taking: Reported on 04/29/2016) 30 capsule 0  . hydrOXYzine (ATARAX/VISTARIL) 50 MG tablet Take 1 tablet (50 mg total) by mouth at bedtime. (Patient not taking: Reported on 04/29/2016) 30 tablet 0  . hydrOXYzine (ATARAX/VISTARIL) 50 MG tablet Take 1 tablet (50 mg total) by mouth at bedtime. (Patient not taking: Reported on 04/29/2016) 30 tablet 0  . lurasidone (LATUDA) 80 MG TABS tablet Take 1 tablet (80 mg total) by mouth every evening. (Patient not taking: Reported on 04/29/2016) 30 tablet 0  . valACYclovir (VALTREX) 500 MG tablet Take 1 tablet (500 mg total) by mouth 2 (two) times daily. 60 tablet 0    Musculoskeletal: Strength & Muscle Tone: within normal limits Gait & Station: normal Patient leans: N/A  Psychiatric  Specialty Exam: Physical Exam  Nursing note and vitals reviewed. Constitutional: He appears well-developed and well-nourished.  HENT:  Head: Normocephalic and atraumatic.  Eyes: Conjunctivae are normal. Pupils are equal, round, and reactive to light.  Neck: Normal range of motion.  Cardiovascular: Regular rhythm and normal heart sounds.   Respiratory: Effort normal. No respiratory distress.  GI: Soft.  Musculoskeletal: Normal range of motion.  Neurological: He is alert.  Skin: Skin is warm and dry.  Psychiatric: His mood appears anxious. His affect is labile. His speech is rapid and/or pressured. He is agitated. He is not aggressive. He expresses impulsivity. He expresses suicidal ideation. He expresses no homicidal ideation. He exhibits abnormal recent memory and abnormal remote memory.    Review of Systems  Constitutional: Negative.   HENT: Negative.   Eyes: Negative.   Respiratory: Negative.   Cardiovascular: Negative.   Gastrointestinal: Negative.   Musculoskeletal: Negative.   Skin: Negative.   Neurological: Negative.   Psychiatric/Behavioral: Positive for depression, hallucinations, substance abuse and suicidal ideas. Negative for memory loss. The patient is nervous/anxious and has insomnia.     Blood pressure 112/75, pulse 81, temperature 98.4 F (36.9 C), temperature source Oral, resp. rate 18, height  6' 3"  (1.905 m), weight 99.8 kg (220 lb), SpO2 98 %.Body mass index is 27.5 kg/m.  General Appearance: Disheveled  Eye Contact:  Fair  Speech:  Pressured  Volume:  Increased  Mood:  Anxious, Depressed and Worthless  Affect:  Depressed and Labile  Thought Process:  Disorganized  Orientation:  Full (Time, Place, and Person)  Thought Content:  Tangential  Suicidal Thoughts:  Yes.  with intent/plan  Homicidal Thoughts:  No  Memory:  Immediate;   Good Recent;   Poor Remote;   Fair  Judgement:  Impaired  Insight:  Shallow  Psychomotor Activity:  Decreased   Concentration:  Concentration: Fair  Recall:  [employer] of Knowledge:  Fair  Language:  Fair  Akathisia:  No  Handed:  Right  AIMS (if indicated):     Assets:  Communication Skills Desire for Improvement Physical Health  ADL's:  Intact  Cognition:  Impaired,  Mild  Sleep:        Treatment Plan Summary: Daily contact with patient to assess and evaluate symptoms and progress in treatment, Medication management and Plan 55 year old man who appears to have schizoaffective disorder with substance abuse. Currently agitated tearful depressed suicidal but fully cooperative with treatment. Not aggressive at all. Thoughts are scattered. Endorses hallucinations and active suicidal thoughts. Unclear how much of this is just substance induced. Patient nevertheless is acutely dangerous and can be admitted to the psychiatric unit. 15 minute checks. Continue IVC. Start lithium carbonate. His kidney function looks fine. He will also be started back on Zyprexa. Patient will be engaged in evaluation and group treatment downstairs and then referred for appropriate outpatient treatment.  Disposition: Recommend psychiatric Inpatient admission when medically cleared.  Alethia Berthold, MD 04/30/2016 1:37 PM

## 2016-04-30 NOTE — ED Notes (Signed)
Pt asking further questions about ECT. Pt wants to be admitted to the unit and started on new medications. Pt complaining of the voices getting worse. Pt pleasant and cooperative. Pt reassured the psychiatrist would see him later this morning or this afternoon. Pt accepting. No further concerns voiced. Maintained on 15 minute checks and observation by security camera for safety.

## 2016-04-30 NOTE — ED Notes (Signed)
Pt compliant with medication. Pt re assured the psychiatrist would be back to speak with him. Pt accepting. Maintained on 15 minute checks and observation by security camera for safety.

## 2016-04-30 NOTE — ED Notes (Signed)
Pt requesting medication for sleep tonight. Psychiatrist notified and placed order for Vistaril PRN. Pt resting, watching TV. No distress noted. Maintained on 15 minute checks and observation by security camera for safety.

## 2016-04-30 NOTE — ED Notes (Signed)
Pt to be admitted to inpatient unit. 

## 2016-04-30 NOTE — ED Notes (Signed)
Dr. Toni Amendlapacs is allowing pt to spend the night in th [facility] in the hopes he will get a bed tomorrow. RN explained this plan to pt. Pt accepting. No further concerns. Maintained on 15 minute checks and observation by security camera for safety.

## 2016-05-01 ENCOUNTER — Inpatient Hospital Stay
Admission: EM | Admit: 2016-05-01 | Discharge: 2016-05-09 | DRG: 885 | Disposition: A | Discharge status: Home / Self Care | Payer: Medicaid Other | Source: Intra-hospital | Attending: Psychiatry | Admitting: Psychiatry

## 2016-05-01 DIAGNOSIS — M1990 Unspecified osteoarthritis, unspecified site: Secondary | ICD-10-CM | POA: Diagnosis present

## 2016-05-01 DIAGNOSIS — F4310 Post-traumatic stress disorder, unspecified: Secondary | ICD-10-CM | POA: Diagnosis present

## 2016-05-01 DIAGNOSIS — F1420 Cocaine dependence, uncomplicated: Secondary | ICD-10-CM | POA: Diagnosis present

## 2016-05-01 DIAGNOSIS — N390 Urinary tract infection, site not specified: Secondary | ICD-10-CM | POA: Diagnosis present

## 2016-05-01 DIAGNOSIS — F250 Schizoaffective disorder, bipolar type: Secondary | ICD-10-CM | POA: Diagnosis not present

## 2016-05-01 DIAGNOSIS — Z9119 Patient's noncompliance with other medical treatment and regimen: Secondary | ICD-10-CM

## 2016-05-01 DIAGNOSIS — G4700 Insomnia, unspecified: Secondary | ICD-10-CM | POA: Diagnosis present

## 2016-05-01 DIAGNOSIS — Z888 Allergy status to other drugs, medicaments and biological substances status: Secondary | ICD-10-CM

## 2016-05-01 DIAGNOSIS — Z91199 Patient's noncompliance with other medical treatment and regimen due to unspecified reason: Secondary | ICD-10-CM

## 2016-05-01 DIAGNOSIS — Z915 Personal history of self-harm: Secondary | ICD-10-CM | POA: Diagnosis not present

## 2016-05-01 DIAGNOSIS — Z87891 Personal history of nicotine dependence: Secondary | ICD-10-CM

## 2016-05-01 DIAGNOSIS — F1994 Other psychoactive substance use, unspecified with psychoactive substance-induced mood disorder: Secondary | ICD-10-CM | POA: Diagnosis present

## 2016-05-01 DIAGNOSIS — F429 Obsessive-compulsive disorder, unspecified: Secondary | ICD-10-CM | POA: Diagnosis present

## 2016-05-01 DIAGNOSIS — Z79899 Other long term (current) drug therapy: Secondary | ICD-10-CM | POA: Diagnosis not present

## 2016-05-01 DIAGNOSIS — R45851 Suicidal ideations: Secondary | ICD-10-CM | POA: Diagnosis present

## 2016-05-01 MED ORDER — ALUM & MAG HYDROXIDE-SIMETH 200-200-20 MG/5ML PO SUSP: 30.0000 mL | ORAL | Status: DC | PRN

## 2016-05-01 MED ORDER — ACETAMINOPHEN 325 MG PO TABS: 650.0000 mg | ORAL_TABLET | Freq: Four times a day (QID) | ORAL | Status: DC | PRN

## 2016-05-01 MED ORDER — TRAZODONE HCL 100 MG PO TABS: 100.0000 mg | ORAL_TABLET | Freq: Every evening | ORAL | Status: DC | PRN

## 2016-05-01 MED ORDER — HYDROXYZINE HCL 25 MG PO TABS
25.0000 mg | ORAL_TABLET | Freq: Three times a day (TID) | ORAL | Status: DC | PRN
  Administered 2016-05-01: 25 mg via ORAL
  Filled 2016-05-01: qty 1

## 2016-05-01 MED ORDER — MAGNESIUM HYDROXIDE 400 MG/5ML PO SUSP: 30.0000 mL | Freq: Every day | ORAL | Status: DC | PRN

## 2016-05-01 MED ORDER — LITHIUM CARBONATE 300 MG PO CAPS: 600.0000 mg | ORAL_CAPSULE | Freq: Two times a day (BID) | ORAL | Status: DC

## 2016-05-01 MED ORDER — OLANZAPINE 10 MG PO TABS
10.0000 mg | ORAL_TABLET | Freq: Every day | ORAL | Status: DC
  Administered 2016-05-02 – 2016-05-05 (×4): 10 mg via ORAL
  Filled 2016-05-01 (×5): qty 1

## 2016-05-01 MED ORDER — IBUPROFEN 800 MG PO TABS
800.0000 mg | ORAL_TABLET | Freq: Four times a day (QID) | ORAL | Status: DC | PRN
  Administered 2016-05-01 – 2016-05-09 (×16): 800 mg via ORAL
  Filled 2016-05-01 (×16): qty 1

## 2016-05-01 NOTE — Progress Notes (Signed)
Patient calm and cooperative during interview and reports feeling overwhelmed with "being alive". He reports worsening auditory hallucinations telling him to harm himself. Pt's plan is to "get help at rehab and get stabilized" before moving back in with his sister. VS monitored and recorded. Skin check performed with Dedra SkeensGwen, RN  and revealed several old scars on arms and legs from cutting. Contraband was not found. Patient was oriented to unit and schedule. PO fluids provided. Safety maintained with 15 minute checks. Patient ID: [name], male   DOB: 10/12/1960, 55 y.o.   MRN: [id number]

## 2016-05-01 NOTE — BH Assessment (Signed)
[facility] Dr. Toni Amend[facility]acs.  Attending Physician will be Dr. Jennet MaduroPucilowska.   Patient [facility] RavendenPhyllis.   Intake Paper Work has been signed and placed on patient chart.

## 2016-05-01 NOTE — Tx Team (Signed)
Initial Treatment Plan 05/01/2016 4:16 PM Perry DollyVincent Y Angus ZOX:[id number]    PATIENT STRESSORS: Educational concerns Medication change or noncompliance Substance abuse   PATIENT STRENGTHS: Ability for insight Average or above average intelligence Communication skills Motivation for treatment/growth Religious Affiliation   PATIENT IDENTIFIED PROBLEMS: Auditory hallucinations 05/01/16  Suicidal ideations 05/01/16  Substance abuse 05/01/16                 DISCHARGE CRITERIA:  Ability to meet basic life and health needs Improved stabilization in mood, thinking, and/or behavior Motivation to continue treatment in a less acute level of care  PRELIMINARY DISCHARGE PLAN: Outpatient therapy Return to previous living arrangement  PATIENT/FAMILY INVOLVEMENT: This treatment plan has been presented to and reviewed with the patient, [name], and/or family memberThe patient and family have been given the opportunity to ask questions and make suggestions.  Shela NevinValerie S Naftoli Penny, RN 05/01/2016, 4:16 PM

## 2016-05-01 NOTE — Plan of Care (Signed)
Problem: Education: Goal: Knowledge of [facility] Education information/materials will improve Outcome: Progressing Information given to pt . He is aware of information and materials given

## 2016-05-01 NOTE — Consult Note (Signed)
[facility] Face-to-Face Psychiatry Consult   Reason for Consult:  Consult for 55 year old man with a history of mental illness who presents requesting to be in the hospital Referring Physician:  McShane Patient Identification: ASHAUN GAUGHAN MRN:  [id number] Principal Diagnosis: Schizoaffective disorder, bipolar type [facility]) Diagnosis:   Patient Active Problem List   Diagnosis Date Noted  . Noncompliance [Z91.19] 04/30/2016  . Schizoaffective disorder, bipolar type ([place]) [F25.0]   . Balanitis [N48.1] 03/26/2015  . Anxiety [F41.9]   . Substance induced mood disorder ([place]) [F19.94] 12/08/2013  . Cocaine use disorder, moderate, dependence ([place]) [F14.20] 06/29/2012  . Alcohol use disorder, moderate, dependence ([place]) [F10.20] 06/29/2012  . Bipolar I disorder, most recent episode depressed, severe without psychotic features ([place]) [F31.4] 04/22/2012    Class: Acute    Total Time spent with patient: 20 minutes  Subjective:   MISSAEL FERRARI is a 55 y.o. male patient admitted with "I just need help I just need help".  Follow-up for this 55 year old man with bipolar disorder or schizoaffective disorder. Patient has not had any behavior problems but continues to be distressed agitated anxious and dysphoric. Passive suicidal thoughts. Disorganized thinking. He has been compliant with medicine. Physically appears to be stable. Patient has been awaiting admission and we now have a bed will be available this afternoon.  HPI:  55 year old man with a history of chronic mental illness. Patient presents as very agitated although not at all hostile. He tells me several times up front that he desperately needs help because his mood is so bad. He says recently his mood has been feeling depressed and down. He hasn't been able to sleep for a couple weeks. He feels negative all the time. He is having auditory hallucinations that occur almost constantly. He has had suicidal thoughts and claims that yesterday he was trying  to find his sister's gun in order to kill himself. Patient says that he is not using drugs and minimizes his alcohol use although his drug screen is positive for cocaine. He says that he stopped taking all of his psychiatric medicines a couple weeks ago. His excuse is that they weren't working although it sounds like he is doing much worse since being off of them. Patient asked me several times if he could have ECT.  Social history: Most recently has been staying with his sister here in town. Prior to that he was in [place] for a little while and before that was with his brother in [place]. Last hospitalization was at [facility] during the summer. Remarkably the patient does not get disability and says that he does not think he has Medicare or Medicaid. Seems to rely on assistance from his family.  Medical history: Relatively good health. Has chronic arthritis in his knees. Also has herpes infection that is chronic but only occasionally flares up and needs medicine.  Substance abuse history: Patient has an established history of alcohol and cocaine abuse which usually is involved it seems in triggering the really bad exacerbations of his symptoms.  Past Psychiatric History: Patient carries diagnoses of schizophrenia schizoaffective disorder bipolar disorder and PTSD. Apparently the PTSD is related to some kind of traumatic incident that happened to him when he was about 55 years old. The overall pattern sounds like schizoaffective disorder with substance abuse issues though. He says he's been on a large number of medicines. The most recent combination was Lamictal and Latuda but he stopped taking those a couple weeks ago. He was also taking Zyprexa. Patient claims  that medicines have not been helpful although the chart would suggest that he does much better when he is on his medicine. He tells me the most helpful thing is ever taken in his life was lithium. He says that he has tried to kill himself multiple  times in the course of his life. Denies any history of violence to others.  Risk to Self: Suicidal Ideation: Yes-Currently Present Suicidal Intent: Yes-Currently Present Is patient at risk for suicide?: Yes Suicidal Plan?: Yes-Currently Present Specify Current Suicidal Plan: voices telling me to get a gun, knife, cut self Access to Means: Yes Specify Access to Suicidal Means: access to gun and other sharpe items ("I can get a gun if I want") What has been your use of drugs/alcohol within the last 12 months?: alcohol, cocaine How many times?: 4 Other Self Harm Risks: cutting Triggers for Past Attempts: Unpredictable, Hallucinations (voices telling to cut self, get a gun, ) Intentional Self Injurious Behavior: Cutting Comment - Self Injurious Behavior: hx cutting face, and legs Risk to Others: Homicidal Ideation: No-Not Currently/Within Last 6 Months Thoughts of Harm to Others: No-Not Currently Present/Within Last 6 Months Current Homicidal Intent: No-Not Currently/Within Last 6 Months Current Homicidal Plan: No-Not Currently/Within Last 6 Months Access to Homicidal Means: No Identified Victim: none reported History of harm to others?: No Assessment of Violence: None Noted Violent Behavior Description: none reported Does patient have access to weapons?: Yes (Comment) Criminal Charges Pending?: No Does patient have a court date: No Prior Inpatient Therapy: Prior Inpatient Therapy: Yes Prior Therapy Dates: 2016,2017 Prior Therapy Facilty/Provider(s): [facility], [facility], [facility] Reason for Treatment: Schizophrenia, PTSD Prior Outpatient Therapy: Prior Outpatient Therapy: No Prior Therapy Dates: none Prior Therapy Facilty/Provider(s): none Does patient have an ACCT team?: No Does patient have Intensive In-House Services?  : No Does patient have [facility] services? : No Does patient have P4CC services?: No  Past Medical History:  Past Medical History:  Diagnosis Date  . Arthritis   .  Balanitis   . Bipolar 1 disorder ([place])   . Depression   . PTSD (post-traumatic stress disorder)   . Schizophrenia ([place])    History reviewed. No pertinent surgical history. Family History: No family history on file. Family Psychiatric  History: Patient does not know of any family history of mental illness Social History:  History  Alcohol Use  . 0.0 oz/week     History  Drug Use  . Types: Marijuana, Cocaine    Social History   Social History  . Marital status: Single    Spouse name: N/A  . Number of children: N/A  . Years of education: N/A   Social History Main Topics  . Smoking status: Former Smoker    Packs/day: 0.00    Types: Cigarettes    Quit date: 05/02/2014  . Smokeless tobacco: Never Used  . Alcohol use 0.0 oz/week  . Drug use:     Types: Marijuana, Cocaine  . Sexual activity: Not Asked   Other Topics Concern  . None   Social History Narrative  . None   Additional Social History:    Allergies:   Allergies  Allergen Reactions  . Lithium Other (See Comments)    caused muscle twitching episodes  . Hydrocodone-Acetaminophen Rash    Pt states he has no allergy to this med    Labs:  Results for orders placed or performed during the hospital encounter of 04/29/16 (from the past 48 hour(s))  Comprehensive metabolic panel     Status:  Abnormal   Collection Time: 04/29/16  6:47 PM  Result Value Ref Range   Sodium 140 135 - 145 mmol/L   Potassium 4.0 3.5 - 5.1 mmol/L   Chloride 102 101 - 111 mmol/L   CO2 31 22 - 32 mmol/L   Glucose, Bld 101 (H) 65 - 99 mg/dL   BUN 22 (H) 6 - 20 mg/dL   Creatinine, Ser 1.09 0.61 - 1.24 mg/dL   Calcium 9.7 8.9 - 10.3 mg/dL   Total Protein 8.5 (H) 6.5 - 8.1 g/dL   Albumin 4.6 3.5 - 5.0 g/dL   AST 24 15 - 41 U/L   ALT 18 17 - 63 U/L   Alkaline Phosphatase 74 38 - 126 U/L   Total Bilirubin 0.6 0.3 - 1.2 mg/dL   GFR calc non Af Amer >60 >60 mL/min   GFR calc Af Amer >60 >60 mL/min    Comment: (NOTE) The eGFR has been  calculated using the CKD EPI equation. This calculation has not been validated in all clinical situations. eGFR's persistently <60 mL/min signify possible Chronic Kidney Disease.    Anion gap 7 5 - 15  Ethanol     Status: None   Collection Time: 04/29/16  6:47 PM  Result Value Ref Range   Alcohol, Ethyl (B) <5 <5 mg/dL    Comment:        LOWEST DETECTABLE LIMIT FOR SERUM ALCOHOL IS 5 mg/dL FOR MEDICAL PURPOSES ONLY   Salicylate level     Status: None   Collection Time: 04/29/16  6:47 PM  Result Value Ref Range   Salicylate Lvl <4.0 2.8 - 30.0 mg/dL  Acetaminophen level     Status: Abnormal   Collection Time: 04/29/16  6:47 PM  Result Value Ref Range   Acetaminophen (Tylenol), Serum <10 (L) 10 - 30 ug/mL    Comment:        THERAPEUTIC CONCENTRATIONS VARY SIGNIFICANTLY. A RANGE OF 10-30 ug/mL MAY BE AN EFFECTIVE CONCENTRATION FOR MANY PATIENTS. HOWEVER, SOME ARE BEST TREATED AT CONCENTRATIONS OUTSIDE THIS RANGE. ACETAMINOPHEN CONCENTRATIONS >150 ug/mL AT 4 HOURS AFTER INGESTION AND >50 ug/mL AT 12 HOURS AFTER INGESTION ARE OFTEN ASSOCIATED WITH TOXIC REACTIONS.   cbc     Status: None   Collection Time: 04/29/16  6:47 PM  Result Value Ref Range   WBC 7.1 3.8 - 10.6 K/uL   RBC 5.24 4.40 - 5.90 MIL/uL   Hemoglobin 16.0 13.0 - 18.0 g/dL   HCT 48.1 40.0 - 52.0 %   MCV 91.8 80.0 - 100.0 fL   MCH 30.5 26.0 - 34.0 pg   MCHC 33.2 32.0 - 36.0 g/dL   RDW 12.5 11.5 - 14.5 %   Platelets 215 150 - 440 K/uL  Urine Drug Screen, Qualitative     Status: Abnormal   Collection Time: 04/29/16  6:47 PM  Result Value Ref Range   Tricyclic, Ur Screen NONE DETECTED NONE DETECTED   Amphetamines, Ur Screen NONE DETECTED NONE DETECTED   MDMA (Ecstasy)Ur Screen NONE DETECTED NONE DETECTED   Cocaine Metabolite,Ur [place] POSITIVE (A) NONE DETECTED   Opiate, Ur Screen NONE DETECTED NONE DETECTED   Phencyclidine (PCP) Ur S NONE DETECTED NONE DETECTED   Cannabinoid 50 Ng, Ur [place] NONE DETECTED NONE  DETECTED   Barbiturates, Ur Screen NONE DETECTED NONE DETECTED   Benzodiazepine, Ur Scrn NONE DETECTED NONE DETECTED   Methadone Scn, Ur NONE DETECTED NONE DETECTED    Comment: (NOTE) 981  Tricyclics, urine  Cutoff 1000 ng/mL 200  Amphetamines, urine             Cutoff 1000 ng/mL 300  MDMA (Ecstasy), urine           Cutoff 500 ng/mL 400  Cocaine Metabolite, urine       Cutoff 300 ng/mL 500  Opiate, urine                   Cutoff 300 ng/mL 600  Phencyclidine (PCP), urine      Cutoff 25 ng/mL 700  Cannabinoid, urine              Cutoff 50 ng/mL 800  Barbiturates, urine             Cutoff 200 ng/mL 900  Benzodiazepine, urine           Cutoff 200 ng/mL 1000 Methadone, urine                Cutoff 300 ng/mL 1100 1200 The urine drug screen provides only a preliminary, unconfirmed 1300 analytical test result and should not be used for non-medical 1400 purposes. Clinical consideration and professional judgment should 1500 be applied to any positive drug screen result due to possible 1600 interfering substances. A more specific alternate chemical method 1700 must be used in order to obtain a confirmed analytical result.  1800 Gas chromato graphy / mass spectrometry (GC/MS) is the preferred 1900 confirmatory method.     Current Facility-Administered Medications  Medication Dose Route Frequency Provider Last Rate Last Dose  . hydrOXYzine (ATARAX/VISTARIL) tablet 50 mg  50 mg Oral Q6H PRN Gonzella Lex, MD   50 mg at 04/30/16 2147  . lithium carbonate capsule 600 mg  600 mg Oral BID WC Gonzella Lex, MD   600 mg at 05/01/16 0943  . OLANZapine (ZYPREXA) tablet 10 mg  10 mg Oral QHS Gonzella Lex, MD   10 mg at 04/30/16 2147   Current Outpatient Prescriptions  Medication Sig Dispense Refill  . FLUoxetine (PROZAC) 20 MG capsule Take 1 capsule (20 mg total) by mouth daily. (Patient not taking: Reported on 04/29/2016) 30 capsule 0  . hydrOXYzine (ATARAX/VISTARIL) 50 MG tablet Take 1  tablet (50 mg total) by mouth at bedtime. (Patient not taking: Reported on 04/29/2016) 30 tablet 0  . hydrOXYzine (ATARAX/VISTARIL) 50 MG tablet Take 1 tablet (50 mg total) by mouth at bedtime. (Patient not taking: Reported on 04/29/2016) 30 tablet 0  . lurasidone (LATUDA) 80 MG TABS tablet Take 1 tablet (80 mg total) by mouth every evening. (Patient not taking: Reported on 04/29/2016) 30 tablet 0  . valACYclovir (VALTREX) 500 MG tablet Take 1 tablet (500 mg total) by mouth 2 (two) times daily. 60 tablet 0    Musculoskeletal: Strength & Muscle Tone: within normal limits Gait & Station: normal Patient leans: N/A  Psychiatric Specialty Exam: Physical Exam  Nursing note and vitals reviewed. Constitutional: He appears well-developed and well-nourished.  HENT:  Head: Normocephalic and atraumatic.  Eyes: Conjunctivae are normal. Pupils are equal, round, and reactive to light.  Neck: Normal range of motion.  Cardiovascular: Regular rhythm and normal heart sounds.   Respiratory: Effort normal. No respiratory distress.  GI: Soft.  Musculoskeletal: Normal range of motion.  Neurological: He is alert.  Skin: Skin is warm and dry.  Psychiatric: His mood appears anxious. His affect is labile. His speech is rapid and/or pressured. He is agitated. He is not aggressive. He expresses impulsivity. He expresses suicidal  ideation. He expresses no homicidal ideation. He exhibits abnormal recent memory and abnormal remote memory.    Review of Systems  Constitutional: Negative.   HENT: Negative.   Eyes: Negative.   Respiratory: Negative.   Cardiovascular: Negative.   Gastrointestinal: Negative.   Musculoskeletal: Negative.   Skin: Negative.   Neurological: Negative.   Psychiatric/Behavioral: Positive for depression, hallucinations, substance abuse and suicidal ideas. Negative for memory loss. The patient is nervous/anxious and has insomnia.     Blood pressure (!) 96/54, pulse 73, temperature 98.4 F  (36.9 C), temperature source Oral, resp. rate 18, height _0  (1.905 m), weight 99.8 kg (220 lb), SpO2 96 %.Body mass index is 27.5 kg/m.  General Appearance: Disheveled  Eye Contact:  Fair  Speech:  Pressured  Volume:  Increased  Mood:  Anxious, Depressed and Worthless  Affect:  Depressed and Labile  Thought Process:  Disorganized  Orientation:  Full (Time, Place, and Person)  Thought Content:  Tangential  Suicidal Thoughts:  Yes.  with intent/plan  Homicidal Thoughts:  No  Memory:  Immediate;   Good Recent;   Poor Remote;   Fair  Judgement:  Impaired  Insight:  Shallow  Psychomotor Activity:  Decreased  Concentration:  Concentration: Fair  Recall:  [employer] of Knowledge:  Fair  Language:  Fair  Akathisia:  No  Handed:  Right  AIMS (if indicated):     Assets:  Communication Skills Desire for Improvement Physical Health  ADL's:  Intact  Cognition:  Impaired,  Mild  Sleep:        Treatment Plan Summary: Daily contact with patient to assess and evaluate symptoms and progress in treatment, Medication management and Plan Continue current medicine. No change as of today. Reassure patient that we are going to have a bed available for them. He will be admitted to psychiatry ward this afternoon with current orders intact.  Disposition: Recommend psychiatric Inpatient admission when medically cleared.  Alethia Berthold, MD 05/01/2016 12:01 PM

## 2016-05-01 NOTE — [facility] Group Notes (Signed)
[facility] Group Notes:  (Nursing/MHT/Case Management/Adjunct)  Date:  05/01/2016  Time:  11:12 PM  Type of Therapy:  Psychoeducational Skills  Participation Level:  Minimal  Participation Quality:  Appropriate  Affect:  Appropriate  Cognitive:  Alert  Insight:  Good  Engagement in Group:  Engaged  Modes of Intervention:  Activity  Summary of Progress/Problems:  Mayra NeerJackie L Britten Seyfried 05/01/2016, 11:12 PM

## 2016-05-01 NOTE — Plan of Care (Signed)
Problem: Coping: Goal: Ability to verbalize frustrations and anger appropriately will improve Outcome: Progressing Pt has already begun working on coping skills.

## 2016-05-01 NOTE — ED Provider Notes (Signed)
-----------------------------------------   7:01 AM on 05/01/2016 -----------------------------------------   Blood pressure (!) 96/54, pulse 73, temperature 98.4 F (36.9 C), temperature source Oral, resp. rate 18, height 6\' 3"  (1.905 m), weight 220 lb (99.8 kg), SpO2 96 %.  The patient had no acute events since last update.  Calm and cooperative at this time.  Disposition is pending Psychiatry/Behavioral Medicine team recommendations.     Willy EddyPatrick Xylon Croom, MD 05/01/16 [phone]

## 2016-05-02 DIAGNOSIS — F250 Schizoaffective disorder, bipolar type: Principal | ICD-10-CM

## 2016-05-02 LAB — URINALYSIS, COMPLETE (UACMP) WITH MICROSCOPIC
BACTERIA UA: NONE SEEN
BILIRUBIN URINE: NEGATIVE
GLUCOSE, UA: NEGATIVE mg/dL
Ketones, ur: NEGATIVE mg/dL
NITRITE: NEGATIVE
PROTEIN: 30 mg/dL — AB
Specific Gravity, Urine: 1.03 (ref 1.005–1.030)
pH: 5 (ref 5.0–8.0)

## 2016-05-02 MED ORDER — FLUVOXAMINE MALEATE 50 MG PO TABS
50.0000 mg | ORAL_TABLET | Freq: Every day | ORAL | Status: DC
  Administered 2016-05-02 – 2016-05-05 (×4): 50 mg via ORAL
  Filled 2016-05-02 (×5): qty 1

## 2016-05-02 MED ORDER — LAMOTRIGINE 25 MG PO TABS
50.0000 mg | ORAL_TABLET | Freq: Every day | ORAL | Status: DC
  Administered 2016-05-02 – 2016-05-07 (×6): 50 mg via ORAL
  Filled 2016-05-02 (×7): qty 2

## 2016-05-02 MED ORDER — ACETAMINOPHEN 325 MG PO TABS
650.0000 mg | ORAL_TABLET | Freq: Four times a day (QID) | ORAL | Status: DC | PRN
  Administered 2016-05-02 – 2016-05-08 (×9): 650 mg via ORAL
  Filled 2016-05-02 (×11): qty 2

## 2016-05-02 MED ORDER — GUAIFENESIN ER 600 MG PO TB12: 600.0000 mg | ORAL_TABLET | Freq: Two times a day (BID) | ORAL | Status: DC

## 2016-05-02 MED ORDER — GUAIFENESIN ER 600 MG PO TB12
600.0000 mg | ORAL_TABLET | Freq: Two times a day (BID) | ORAL | Status: DC
  Administered 2016-05-02 – 2016-05-09 (×14): 600 mg via ORAL
  Filled 2016-05-02 (×15): qty 1

## 2016-05-02 MED ORDER — HYDROXYZINE HCL 50 MG PO TABS
100.0000 mg | ORAL_TABLET | Freq: Every day | ORAL | Status: DC
  Administered 2016-05-02 – 2016-05-08 (×7): 100 mg via ORAL
  Filled 2016-05-02 (×7): qty 2

## 2016-05-02 MED ORDER — FLUPHENAZINE HCL 5 MG PO TABS
5.0000 mg | ORAL_TABLET | Freq: Three times a day (TID) | ORAL | Status: DC
  Administered 2016-05-02 – 2016-05-07 (×15): 5 mg via ORAL
  Filled 2016-05-02 (×17): qty 1
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
## 2016-05-02 NOTE — Plan of Care (Signed)
Problem: Education: Goal: Knowledge of the prescribed therapeutic regimen will improve Outcome: Progressing Pt refused medications yesterday/last night, but after making requests with doctor and medication order changes, pt is compliant and knowledgeable regarding his medications.
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
## 2016-05-02 NOTE — Progress Notes (Signed)
Recreation Therapy Notes  Date: 12.08.17 Time: 9:30 am Location: Craft Room  Group Topic: Coping Skills  Goal Area(s) Addresses:  Patient will participate in healthy coping skill. Patient will verbalize benefit of using art as a coping skill.  Behavioral Response: Attentive  Intervention: Coloring  Activity: Patients were given coloring sheets to color and were instructed to think about what emotions they were feeling and what their minds were focused on.  Education: LRT educated patients on healthy coping skills.  Education Outcome: In group clarification offered   Clinical Observations/Feedback: Patient colored coloring sheet. Patient did not contribute to group discussion.  Jacquelynn CreeGreene,Adarian Bur M, LRT/CTRS 05/02/2016 10:16 AM

## 2016-05-02 NOTE — Progress Notes (Signed)
Pt requesting "tylenol or motrin for pain." Informed pt that his allergies state he is allergic to tylenol and lithium. Pt states "I am not allergic to tylenol or lithium. Those are just meds that didn't work for me before." Will inform Md. IBU given as ordered PRN for generalized pain 10/10. Pt also requesting medication for congestion/drainage. States "im coughing up yellow stuff." Safety maintained. Will continue to monitor.

## 2016-05-02 NOTE — [facility] Group Notes (Signed)
[facility] Group Notes:  (Nursing/MHT/Case Management/Adjunct)  Date:  05/02/2016  Time:  5:07 PM  Type of Therapy:  Group Therapy  Participation Level:  Did Not Attend  [name] 05/02/2016, 5:07 PM
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
## 2016-05-02 NOTE — [facility] Group Notes (Signed)
[facility] LCSW Group Therapy   05/02/2016 1 PM   Type of Therapy: Group Therapy   Participation Level: Active   Participation Quality: Attentive, Sharing and Supportive   Affect: Appropriate   Cognitive: Alert and Oriented   Insight: Developing/Improving and Engaged   Engagement in Therapy: Developing/Improving and Engaged   Modes of Intervention: Clarification, Confrontation, Discussion, Education, Exploration, Limit-setting, Orientation, Problem-solving, Rapport Building, Dance movement psychotherapisteality Testing, Socialization and Support   Summary of Progress/Problems: The topic for today was feelings about relapse. Pt discussed what relapse prevention is to them and identified triggers that they are on the path to relapse. Pt processed their feeling towards relapse and was able to relate to peers. Pt discussed coping skills that can be used for relapse prevention. Pt defined relapse as "backtracking." He identified symptoms such as stress, hostility, and engaging in risky behaviors as warning signs of a potential relapse. Pt stated to reduce exposure to relapse, he will go to inpatient treatment, start medications and take them as prescribed.  Hampton AbbotKadijah Mohmed Farver, MSW, LCSWA 05/02/2016, 1:37PM

## 2016-05-02 NOTE — Progress Notes (Addendum)
Angry, pacing, hostile, irritable and about to "burst into anger" just at the start of the shift, "I want to shave, shower, do laundry, and change into a new set of shirt and pants ..." [name] is a big man; encouraged to work with us until 9 pm to accommodate his needs; he quickly calmed down and said "yes"; supervision provided to ensure safety during shaving, patient was also noted trying to hide a spoon under his shirt after snack time; patient requested for a single room, "I don't want to share a room with anyone, I have PTSD, Paranoid, I am depressed, bipolar and resistant to all medications, that is why I am here for ECT, the Dr. will fry my brain..." Education about ECT provided, patient is restless and needs reinforcements about his understanding of ECT. Endorses passive SI, verbally contracted for safety.  Refused Zyprexa 10 mg as scheduled, "I told the Dr. Claiborne Billingshat I am not taking medications, I want ECT, the only medication that I am taking is Vistaril ..." 25 mg of Vistaril PO PRN given at bedtime.

## 2016-05-02 NOTE — Progress Notes (Signed)
Pt awake, alert, oriented and up on unit today. Pt is very assertive with interaction, irritable, makes multiple requests, but is cooperative. Seems somewhat paranoid. Is noted to be out of his room with minimal interaction with peers, but does attend group. Pt fixated on going to residential treatment for drug abuse and requests numbers to every treatment center in the state, stating "I want to make my own follow up." SW informed of pt's request. Medication compliant today. Noted to be pacing the hallway this afternoon.   Support and encouragement provided with use of therapeutic communication. Medications administered as ordered with education. Safety maintained with every 15 minute checks. Will continue to monitor.

## 2016-05-03 NOTE — Progress Notes (Addendum)
Patient with blunted affect, cooperative behavior with meals, meds and plan of care. No SI/HI at this time. Verbalizes needs appropriately with staff. Quiet with peers. Guarded during assessment. MD into see. Watches tv and rests in bed during free time. Safety maintained. Alternates Tylenol and Ibuprofen for c/o chronic arthritis discomfort with good effect.

## 2016-05-03 NOTE — Progress Notes (Signed)
Patient ID: [name], male   DOB: 08/04/1960, 55 y.o.   MRN: [id number]  CSW faxed referral for Patient to [facility] in [place]. CSW called [facility] and [facility] in [place], neither facility has available beds. Patient states he does not want to go to [name] [place].  Railey Glad [name] MSW, LCSWA 05/03/2016 11:09 AM
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
## 2016-05-03 NOTE — Progress Notes (Signed)
Pt endorses Passive SI, verbally contracts for safety. Affect apprehensive. Pt slightly suspicious during med pass but medication compliant. Visible in milieu with minimal interaction. Denies pain. Voices no additional concerns at this time. Safety maintained. Will continue to monitor.

## 2016-05-03 NOTE — BHH Group Notes (Signed)
BHH LCSW Group Therapy  05/03/2016 2:20 PM  Type of Therapy:  Group Therapy  Participation Level:  Active  Participation Quality:  Appropriate and Sharing  Affect:  Appropriate  Cognitive:  Alert  Insight:  Engaged  Engagement in Therapy:  Engaged  Modes of Intervention:  Activity, Discussion, Education, Problem-solving, Dance movement psychotherapisteality Testing, Socialization and Support  Summary of Progress/Problems:Stress management: Patients defined and discussed the topic of stress and the related symptoms and triggers for stress. Patients identified healthy coping skills they would like to try during hospitalization and after discharge to manage stress in a healthy way. CSW offered insight to varying stress management techniques.   [name] [name] MSW, LCSWA 05/03/2016, 2:24 PM

## 2016-05-04 MED ORDER — SULFAMETHOXAZOLE-TRIMETHOPRIM 800-160 MG PO TABS
1.0000 | ORAL_TABLET | Freq: Two times a day (BID) | ORAL | Status: DC
  Administered 2016-05-04 – 2016-05-09 (×11): 1 via ORAL
  Filled 2016-05-04 (×11): qty 1

## 2016-05-04 MED ORDER — SULFAMETHOXAZOLE-TRIMETHOPRIM 800-160 MG PO TABS: 1.0000 | ORAL_TABLET | Freq: Two times a day (BID) | ORAL | Status: DC

## 2016-05-04 NOTE — Progress Notes (Signed)
Patient with sad affect, cooperative behavior with meals, meds and plan of care. No SI/HI at this time. Verbalizes needs appropriately. Takes prn pain med for chronic discomfort from arthritis with good effect. MD taking Mucinex for s/s of head cold and po fluids encouraged. MD orders antibiotic and med initiated rt results or urine testing. Safety maintained.
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
## 2016-05-04 NOTE — BHH Group Notes (Signed)
BHH LCSW Group Therapy  05/04/2016 2:09 PM  Type of Therapy:  Group Therapy  Participation Level:  Minimal  Participation Quality:  Attentive and Sharing  Affect:  Appropriate  Cognitive:  Alert  Insight:  Limited  Engagement in Therapy:  Engaged  Modes of Intervention:  Activity, Discussion, Education, Socialization and Support  Summary of Progress/Problems:Coping Skills: Patients defined and discussed healthy coping skills. Patients identified healthy coping skills they would like to try during hospitalization and after discharge. CSW offered insight to varying coping skills that may have been new to patients such as practicing mindfulness.   Cali Cuartas [name] MSW, LCSWA 05/04/2016, 2:11 PM

## 2016-05-04 NOTE — BHH Suicide Risk Assessment (Signed)
BHH INPATIENT:  Family/Significant Other Suicide Prevention Education  Suicide Prevention Education:  Contact Attempts:[name](sister [phone]), has been identified by the patient as the family member/significant other with whom the patient will be residing, and identified as the person(s) who will aid the patient in the event of a mental health crisis.  With written consent from the patient, two attempts were made to provide suicide prevention education, prior to and/or following the patient's discharge.  We were unsuccessful in providing suicide prevention education.  A suicide education pamphlet was given to the patient to share with family/significant other.  Date and time of first attempt: 05/04/2016 / 10:59am ; unable to leave voicemail Date and time of second attempt: 05/04/2016 / 4:05pm; unable to leave voicemail.  [name] MSW, LCSWA 05/04/2016, 4:05 PM

## 2016-05-04 NOTE — Plan of Care (Signed)
Problem: Health Behavior/Discharge Planning: Goal: Compliance with prescribed medication regimen will improve Outcome: Progressing Pt has been compliant with prescribed medications.   

## 2016-05-05 LAB — URINE CULTURE
CULTURE: NO GROWTH
SPECIAL REQUESTS: NORMAL

## 2016-05-05 NOTE — Plan of Care (Signed)
Problem: Education: Goal: Mental status will improve Outcome: Progressing Denies SI

## 2016-05-05 NOTE — BHH Group Notes (Signed)
B[facility] LCSW Group Therapy   05/05/2016 9:30 am Type of Therapy: Group Therapy   Participation Level: Pt invited but did not attend.  Participation Quality: Pt invited but did not attend.   Hampton AbbotKadijah Tessi Eustache, MSW, LCSWA 05/05/2016, 10:32AM

## 2016-05-05 NOTE — Progress Notes (Signed)
Patient ID: [name], male   DOB: 01/15/1961, 55 y.o.   MRN: [id number] [name] should be doing phone or in face interview with Pt this evening at 5:30pm.  Pt will need to call [phone] and speak with [name].  Jake SharkSara Lynlee Stratton, LCSW 05/05/2016

## 2016-05-05 NOTE — Progress Notes (Signed)
Patient pleasant and cooperative with mild anxiety. Denies SI, HI, AVH at present. Medication and group compliant. Complaints of arthritis requested pain meds. Prn given with partial relief Encouragement and support offered. Medications given as prescribed. Encouraged group participation. Safety checks maintained. Pt receptive and remains safe on unit with q 15 min checks. Will continue to assess and monitor for safety.

## 2016-05-05 NOTE — Progress Notes (Signed)
Pt endorses Passive SI, verbally contracts for safety. Affect apprehensive. Pt slightly suspicious during med pass but medication compliant. Anxious when there are new admissions, worried about getting a roommate.  Visible in milieu with minimal interaction. Motrin given for generalized pain. Voices no additional concerns at this time. Safety maintained. Will continue to monitor

## 2016-05-05 NOTE — Progress Notes (Signed)
Recreation Therapy Notes  Date: 12.11.17 Time: 9:30 am Location: Craft Room  Group Topic: Self-expression  Goal Area(s) Addresses:  Patient will effectively use art as a means of self-expression. Patient will recognize positive benefit of self-expression. Patient will be able to identify one emotion experienced during group session. Patient will identify use of art as a coping skill.  Behavioral Response: Attentive, Interactive  Intervention: Two Faces of Me  Activity: Patients were given a blank face worksheet and were instructed to draw a line down the middle. On one side, they were instructed to draw or write how they felt when they were admitted to the hospital. On the other side, they were instructed to draw or write how they want to feel when they are d/c.  Education: LRT educated patients on other forms of self-expression.  Education Outcome: Acknowledges education/In group clarification offered   Clinical Observations/Feedback: Patient wrote how he felt when he was admitted to the hospital and how he wanted to feel when he was d/c. Patient contributed to group discussion by stating how his faces were different, what he has learned to help with positive change, how he will maintain the positive change, what emotions he felt during group, and what his mind was focused on during group.  Jacquelynn CreeGreene,Khushboo Chuck M, LRT/CTRS 05/05/2016 10:16 AM
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
## 2016-05-05 NOTE — [facility] Group Notes (Signed)
[facility] LCSW Group Therapy   05/05/2016  1 PM  Type of Therapy: Group Therapy   Participation Level: Did Not Attend. Patient invited to participate but declined.    Sumiya Mamaril F. Irvine Glorioso, MSW, LCSWA, LCAS     

## 2016-05-06 MED ORDER — NYSTATIN 100000 UNIT/GM EX POWD
Freq: Two times a day (BID) | CUTANEOUS | Status: DC
  Administered 2016-05-06: 1 g via TOPICAL
  Administered 2016-05-07 – 2016-05-08 (×2): via TOPICAL
  Administered 2016-05-08: 1 g via TOPICAL
  Filled 2016-05-06: qty 15

## 2016-05-06 MED ORDER — OLANZAPINE 7.5 MG PO TABS
15.0000 mg | ORAL_TABLET | Freq: Every day | ORAL | Status: DC
  Administered 2016-05-06 – 2016-05-07 (×2): 15 mg via ORAL
  Filled 2016-05-06 (×3): qty 2

## 2016-05-06 MED ORDER — FLUVOXAMINE MALEATE 50 MG PO TABS
100.0000 mg | ORAL_TABLET | Freq: Every day | ORAL | Status: DC
  Administered 2016-05-06: 100 mg via ORAL
  Filled 2016-05-06: qty 2

## 2016-05-06 NOTE — [facility] Group Notes (Signed)
[facility] Group Notes:  (Nursing/MHT/Case Management/Adjunct)  Date:  05/06/2016  Time:  1:59 PM  Type of Therapy:  Group Therapy  Participation Level:  Active  Participation Quality:  Drowsy and Sharing  Affect:  Appropriate  Cognitive:  Appropriate  Insight:  Appropriate  Engagement in Group:  Engaged  Modes of Intervention:  Support  Summary of Progress/Problems:  [name] 05/06/2016, 1:59 PM

## 2016-05-06 NOTE — Progress Notes (Signed)
D: Patient appears sad and depressed. States his mood is depressed and rates it at an 8. C/o passive SI but contracts for safety. Denies HI. Endorses AH saying that it's going to be the end of the world. States he also feels agitated which stems from his family giving up on him. Patient c/o generalized pain. A: Medication given with education. Encouragement provided.  R: Patient was compliant of medication. He has remained calm and cooperative. Safety maintained with 15 min checks.

## 2016-05-06 NOTE — [facility] Group Notes (Signed)
[facility] LCSW Group Therapy   05/06/2016 9:30 am   Type of Therapy: Group Therapy   Participation Level: Active   Participation Quality: Attentive, Sharing and Supportive   Affect: Appropriate  Cognitive: Alert and Oriented   Insight: Developing/Improving and Engaged   Engagement in Therapy: Developing/Improving and Engaged   Modes of Intervention: Clarification, Confrontation, Discussion, Education, Exploration,  Limit-setting, Orientation, Problem-solving, Rapport Building, Dance movement psychotherapisteality Testing, Socialization and Support  Summary of Progress/Problems: The topic for group therapy was feelings about diagnosis. Pt actively participated in group discussion on their past and current diagnosis and how they feel towards this. Pt also identified how society and family members judge them, based on their diagnosis as well as stereotypes and stigmas. Pt stated that his diagnosis is major depressive disorder. He identified feelings of anger, depression, loneliness, and resentful as how he feels about his diagnosis. He stated that he feels judged by society because of having a mental illness. He stated that his spirituality and prayer are ways that he copes with his feelings.     Perry AbbotKadijah Judye [name], MSW, LCSWA 05/06/2016, 11:20AM

## 2016-05-06 NOTE — Progress Notes (Signed)
Recreation Therapy Notes  Date: 12.12.17 Time: 3:00 pm Location: Craft Room  Group Topic: Self-expression.  Goal Area(s) Addresses:  Patient will draw bottle. Patient will write at least one emotion he/she is feeling.  Behavioral Response: Left early  Intervention: Bottled Up  Activity: Patients were instructed to draw a bottle the way they see themselves. Patients were then instructed to write the emotions they were feeling inside the bottle.  Education: LRT educated patients on other forms of self-expression.  Education Outcome: Patient left before LRT educated group.  Clinical Observations/Feedback: Patient left group at approximately 3:17 pm to see his nurse. Patient did not return to group.  Jacquelynn CreeGreene,Jorgen Wolfinger M, LRT/CTRS 05/06/2016 4:06 PM

## 2016-05-06 NOTE — Plan of Care (Signed)
Problem: Safety: Goal: Ability to remain free from injury will improve Outcome: Progressing Patient has remained free from injury during this shift.   

## 2016-05-06 NOTE — Progress Notes (Signed)
D: Patient stated slept poor last night .Stated appetite is fair and energy level  Poor l. Stated concentration fair   . Stated on Depression scale 10 , hopeless 10  and anxiety 10 .( low 0-10 high) Denies suicidal  homicidal ideations  .Voice of   auditory hallucinations voice  Tell him  " I call them demons  They are  All attacking me "  Some religiosity  Having   pain concerns  With  Arthritis of the knees . Appropriate ADL'S. Interacting with peers and staff.  Voice of his goal being  stablized on the right medication .  A: Encourage patient participation with unit programming . Instruction  Given on  Medication , verbalize understanding. R: Voice no other concerns. Staff continue to monitor

## 2016-05-06 NOTE — Tx Team (Signed)
Interdisciplinary Treatment and Diagnostic Plan Update  05/06/2016 Time of Session: 10:30am [name] MRN: [id number]  Principal Diagnosis: Schizoaffective disorder, bipolar type [facility](HCC)  Secondary Diagnoses: Principal Problem:   Schizoaffective disorder, bipolar type (HCC) Active Problems:   Cocaine use disorder, moderate, dependence (HCC)   Substance induced mood disorder (HCC)   Noncompliance   Current Medications:  Current Facility-Administered Medications  Medication Dose Route Frequency Provider Last Rate Last Dose  . acetaminophen (TYLENOL) tablet 650 mg  650 mg Oral Q6H PRN Shari ProwsJolanta B Pucilowska, MD   650 mg at 05/05/16 2000  . alum & mag hydroxide-simeth (MAALOX/MYLANTA) 200-200-20 MG/5ML suspension 30 mL  30 mL Oral Q4H PRN Audery AmelJohn T Clapacs, MD      . fluPHENAZine (PROLIXIN) tablet 5 mg  5 mg Oral Q8H Jolanta B Pucilowska, MD   5 mg at 05/06/16 1524  . fluvoxaMINE (LUVOX) tablet 50 mg  50 mg Oral QHS Shari ProwsJolanta B Pucilowska, MD   50 mg at 05/05/16 2128  . guaiFENesin (MUCINEX) 12 hr tablet 600 mg  600 mg Oral BID Shari ProwsJolanta B Pucilowska, MD   600 mg at 05/06/16 1703  . hydrOXYzine (ATARAX/VISTARIL) tablet 100 mg  100 mg Oral QHS Shari ProwsJolanta B Pucilowska, MD   100 mg at 05/05/16 2127  . ibuprofen (ADVIL,MOTRIN) tablet 800 mg  800 mg Oral Q6H PRN Jolanta B Pucilowska, MD   800 mg at 05/06/16 1524  . lamoTRIgine (LAMICTAL) tablet 50 mg  50 mg Oral QHS Shari ProwsJolanta B Pucilowska, MD   50 mg at 05/05/16 2127  . magnesium hydroxide (MILK OF MAGNESIA) suspension 30 mL  30 mL Oral Daily PRN Audery AmelJohn T Clapacs, MD      . nystatin (MYCOSTATIN/NYSTOP) topical powder   Topical BID Shari ProwsJolanta B Pucilowska, MD   1 g at 05/06/16 1701  . OLANZapine (ZYPREXA) tablet 10 mg  10 mg Oral QHS Audery AmelJohn T Clapacs, MD   10 mg at 05/05/16 2128  . sulfamethoxazole-trimethoprim (BACTRIM DS,SEPTRA DS) 800-160 MG per tablet 1 tablet  1 tablet Oral Q12H Jimmy FootmanAndrea Hernandez-Gonzalez, MD   1 tablet at 05/06/16 0731   PTA  Medications: Prescriptions Prior to Admission  Medication Sig Dispense Refill Last Dose  . FLUoxetine (PROZAC) 20 MG capsule Take 1 capsule (20 mg total) by mouth daily. (Patient not taking: Reported on 04/29/2016) 30 capsule 0 Not Taking at Unknown time  . hydrOXYzine (ATARAX/VISTARIL) 50 MG tablet Take 1 tablet (50 mg total) by mouth at bedtime. (Patient not taking: Reported on 04/29/2016) 30 tablet 0 Not Taking at Unknown time  . hydrOXYzine (ATARAX/VISTARIL) 50 MG tablet Take 1 tablet (50 mg total) by mouth at bedtime. (Patient not taking: Reported on 04/29/2016) 30 tablet 0 Not Taking at Unknown time  . lurasidone (LATUDA) 80 MG TABS tablet Take 1 tablet (80 mg total) by mouth every evening. (Patient not taking: Reported on 04/29/2016) 30 tablet 0 Not Taking at Unknown time  . valACYclovir (VALTREX) 500 MG tablet Take 1 tablet (500 mg total) by mouth 2 (two) times daily. 60 tablet 0 prn at prn    Patient Stressors: Educational concerns Medication change or noncompliance Substance abuse  Patient Strengths: Ability for insight Average or above average Radio producerintelligence Communication skills Motivation for treatment/growth Religious Affiliation  Treatment Modalities: Medication Management, Group therapy, Case management,  1 to 1 session with clinician, Psychoeducation, Recreational therapy.   Physician Treatment Plan for Primary Diagnosis: Schizoaffective disorder, bipolar type (HCC) Long Term Goal(s): Improvement in symptoms so as ready for discharge  Improvement in symptoms so as ready for discharge   Short Term Goals: Ability to identify changes in lifestyle to reduce recurrence of condition will improve Ability to verbalize feelings will improve Ability to disclose and discuss suicidal ideas Ability to demonstrate self-control will improve Ability to identify and develop effective coping behaviors will improve Ability to maintain clinical measurements within normal limits will  improve Compliance with prescribed medications will improve Ability to identify changes in lifestyle to reduce recurrence of condition will improve Ability to demonstrate self-control will improve Ability to identify triggers associated with substance abuse/mental health issues will improve  Medication Management: Evaluate patient's response, side effects, and tolerance of medication regimen.  Therapeutic Interventions: 1 to 1 sessions, Unit Group sessions and Medication administration.  Evaluation of Outcomes: Progressing  Physician Treatment Plan for Secondary Diagnosis: Principal Problem:   Schizoaffective disorder, bipolar type (HCC) Active Problems:   Cocaine use disorder, moderate, dependence (HCC)   Substance induced mood disorder (HCC)   Noncompliance  Long Term Goal(s): Improvement in symptoms so as ready for discharge Improvement in symptoms so as ready for discharge   Short Term Goals: Ability to identify changes in lifestyle to reduce recurrence of condition will improve Ability to verbalize feelings will improve Ability to disclose and discuss suicidal ideas Ability to demonstrate self-control will improve Ability to identify and develop effective coping behaviors will improve Ability to maintain clinical measurements within normal limits will improve Compliance with prescribed medications will improve Ability to identify changes in lifestyle to reduce recurrence of condition will improve Ability to demonstrate self-control will improve Ability to identify triggers associated with substance abuse/mental health issues will improve     Medication Management: Evaluate patient's response, side effects, and tolerance of medication regimen.  Therapeutic Interventions: 1 to 1 sessions, Unit Group sessions and Medication administration.  Evaluation of Outcomes: Progressing   RN Treatment Plan for Primary Diagnosis: Schizoaffective disorder, bipolar type (HCC) Long Term  Goal(s): Knowledge of disease and therapeutic regimen to maintain health will improve  Short Term Goals: Ability to demonstrate self-control, Ability to participate in decision making will improve, Ability to verbalize feelings will improve, Ability to identify and develop effective coping behaviors will improve and Compliance with prescribed medications will improve  Medication Management: RN will administer medications as ordered by provider, will assess and evaluate patient's response and provide education to patient for prescribed medication. RN will report any adverse and/or side effects to prescribing provider.  Therapeutic Interventions: 1 on 1 counseling sessions, Psychoeducation, Medication administration, Evaluate responses to treatment, Monitor vital signs and CBGs as ordered, Perform/monitor CIWA, COWS, AIMS and Fall Risk screenings as ordered, Perform wound care treatments as ordered.  Evaluation of Outcomes: Progressing   LCSW Treatment Plan for Primary Diagnosis: Schizoaffective disorder, bipolar type (HCC) Long Term Goal(s): Safe transition to appropriate next level of care at discharge, Engage patient in therapeutic group addressing interpersonal concerns.  Short Term Goals: Engage patient in aftercare planning with referrals and resources, Facilitate acceptance of mental health diagnosis and concerns, Facilitate patient progression through stages of change regarding substance use diagnoses and concerns and Increase skills for wellness and recovery  Therapeutic Interventions: Assess for all discharge needs, 1 to 1 time with Social worker, Explore available resources and support systems, Assess for adequacy in community support network, Educate family and significant other(s) on suicide prevention, Complete Psychosocial Assessment, Interpersonal group therapy.  Evaluation of Outcomes: Progressing   Progress in Treatment: Attending groups: Yes. Participating in groups:  Yes. Taking  medication as prescribed: Yes. Toleration medication: Yes. Family/Significant other contact made: No, will contact:    Patient understands diagnosis: Yes. Discussing patient identified problems/goals with staff: Yes. Medical problems stabilized or resolved: Yes. Denies suicidal/homicidal ideation: Yes. Issues/concerns per patient self-inventory: No. Other:    New problem(s) identified: No, Describe:     New Short Term/Long Term Goal(s):  Discharge Plan or Barriers: TBD, referral made to [facility]. Potential openings this week  Reason for Continuation of Hospitalization: Hallucinations Medication stabilization  Estimated Length of Stay:3-4 days  Attendees: Patient:[name] 05/06/2016 5:30 PM  Physician: Braulio Conte Pucilowska 05/06/2016 5:30 PM  Nursing: Hulan Amato, RN 05/06/2016 5:30 PM  RN Care Manager: 05/06/2016 5:30 PM  Social Worker: Huntley Dec Chazlyn Cude, LCSW 05/06/2016 5:30 PM  Recreational Therapist: Hershal Coria,  LRT 05/06/2016 5:30 PM  Other:  05/06/2016 5:30 PM  Other:  05/06/2016 5:30 PM  Other: 05/06/2016 5:30 PM    Scribe for Treatment Team: Glennon Mac, LCSW 05/06/2016 5:30 PM
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
## 2016-05-07 MED ORDER — FLUVOXAMINE MALEATE 50 MG PO TABS
150.0000 mg | ORAL_TABLET | Freq: Every day | ORAL | Status: DC
  Administered 2016-05-07: 150 mg via ORAL
  Filled 2016-05-07 (×2): qty 3

## 2016-05-07 MED ORDER — FLUPHENAZINE HCL 5 MG PO TABS
10.0000 mg | ORAL_TABLET | Freq: Two times a day (BID) | ORAL | Status: DC
  Administered 2016-05-07 – 2016-05-09 (×4): 10 mg via ORAL
  Filled 2016-05-07 (×4): qty 2

## 2016-05-07 NOTE — Plan of Care (Signed)
Problem: Medication: Goal: Compliance with prescribed medication regimen will improve Outcome: Progressing Patient has been compliant with medication.

## 2016-05-07 NOTE — Progress Notes (Signed)
Recreation Therapy Notes  Date: 12.13.17 Time: 1:00 pm Location: Craft Room  Group Topic: Self-esteem  Goal Area(s) Addresses:  Patient will write at least one positive trait about self. Patient will verbalize benefit of having healthy self-esteem.  Behavioral Response: Intermittently Attentive  Intervention: I Am  Activity: Patients were given a worksheet with the letter I on it and were instructed to write as many positive traits inside the letter.  Education: LRT educated patients on ways they can increase their self-esteem.  Education Outcome: n group clarification offered  Clinical Observations/Feedback: Patient wrote positive traits about self. Patient closed his eyes for most of group. Patient did not contribute to group discussion.  Jacquelynn CreeGreene,Menashe Kafer M, LRT/CTRS 05/07/2016 2:40 PM

## 2016-05-07 NOTE — Progress Notes (Signed)
Pt awake, alert, up on unit today. Appropriately interacts with staff/peers. Much less intrusive and demanding that last week. Continues to report being depressed and anxious regarding substance abuse treatment. Endorses hearing auditory hallucinations and reports passive SI, but verbally contracts for safety. Attends group. Pt is medication compliant. Calm and cooperative.   Support and encouragement provided with use of therapeutic communication. Medications administered as ordered with education. Safety maintained with every 15 minute checks. Will continue to monitor.

## 2016-05-07 NOTE — Progress Notes (Signed)
Patient ID: [name], male   DOB: 03-20-1961, 55 y.o.   MRN: [id number]   CSW met with patient to update him on the status of referral that was sent to [facility]. Staff member Juliann Pulse in Admissions at [facility] told CSW that their is still no empty male beds and have CSW call tomorrow to find out for sure.   CSW discussed other options with patient such as [facility] or transitional housing. Patient stated he does not want to live in environment such as an [facility]. Their is currently no beds at [facility], [facility], [facility]. Patient did not want to be referred to [facility], or [facility].  Patient then stated to CSW, "I'm sorry to give you the impression that I was homeless. Really I just said I wanted inpatient treatment so I wouldn't have to go back to my sister's house". Patient stated "I refuse to go to shelter or anywhere like that, my Lord and [place] did not put on this earth to live in a shelter".   CSW will inform physician of update information.  Helio Lack G. Claybon Jabs MSW, [place] 05/07/2016 4:11 PM

## 2016-05-07 NOTE — Plan of Care (Signed)
Problem: Coping: Goal: Ability to cope will improve Outcome: Not Progressing Pt not able to cope at this time due to lack of knowledge of coping mechanisms [place] RN

## 2016-05-07 NOTE — Progress Notes (Signed)
Pt refused lab drawing his blood this morning. Lab called to request to come back and try again. This nurse approached pt regarding lab orders, explained what labs are to be drawn and why. Pt continues to refuse stating, "I am a Jehovah's Witness. I do not give blood. I do not receive blood." Lab tech informed. Will inform Md. Safety maintained. Will continue to monitor.

## 2016-05-07 NOTE — Plan of Care (Signed)
Problem: Education: Goal: Will be free of psychotic symptoms Outcome: Not Progressing Pt continues to endorse SI with auditory hallucinations to harm self. Verbally consents for safety.

## 2016-05-07 NOTE — Progress Notes (Signed)
D: Patient appears sad and depressed. C/o passive SI but contracts for safety. Denies HI. Endorses AH. Patient c/o generalized pain. He has been visible in the milieu. Minimal interaction with staff.  A: Medication given with education. Encouragement provided.  R: Patient was compliant of medication. He has remained calm and cooperative. Safety maintained with 15 min checks.

## 2016-05-07 NOTE — [facility] Group Notes (Addendum)
[facility] LCSW Group Therapy   05/07/2016  9:30 am   Type of Therapy: Group Therapy   Participation Level: Active   Participation Quality: Attentive, Sharing and Supportive   Affect: Appropriate   Cognitive: Alert and Oriented   Insight: Developing/Improving and Engaged   Engagement in Therapy: Developing/Improving and Engaged   Modes of Intervention: Clarification, Confrontation, Discussion, Education, Exploration, Limit-setting, Orientation, Problem-solving, Rapport Building, Dance movement psychotherapisteality Testing, Socialization and Support   Summary of Progress/Problems: The topic for group today was emotional regulation. This group focused on both positive and negative emotion identification and allowed  group members to process ways to identify feelings, regulate negative emotions, and find healthy ways to manage internal/external emotions. Group members were asked to reflect on a time when their reaction to an emotion led to a negative outcome and explored how alternative responses using emotion regulation would have benefited them. Group members were also asked to discuss a time when emotion regulation was utilized when a negative emotion was experienced. Pt defined emotion regulation as "a way to channel emotions." He stated that his negative emotion is usually anger and the action that he associates with that negative emotion is withdrawal or isolation. Pt identified prayer, listening to music, and being active as ways to regulate those negative emotions.    Hampton AbbotKadijah Noma Quijas, MSW, LCSWA 05/07/2016, 1:50PM
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
## 2016-05-07 NOTE — Progress Notes (Signed)
D: Patient is alert and oriented on the unit this shift. Patient not attended and actively participated in groups today. Patient denies suicidal ideation, homicidal ideation, auditory or visual hallucinations at the present time.  A: Scheduled medications are administered to patient as per MD orders. Emotional support and encouragement are provided. Patient is maintained on q.15 minute safety checks. Patient is informed to notify staff with questions or concerns. R: No adverse medication reactions are noted. Patient is cooperative with medication administration . Patient is receptive, anxious ,depressed but   cooperative on the unit at this time. Patient does not  Interact with others on the unit this shift. Patient contracts for safety at this time. Patient remains safe at this time. Anxiety 10/10 Depression 10/10

## 2016-05-08 MED ORDER — OLANZAPINE 15 MG PO TABS: 15.0000 mg | ORAL_TABLET | Freq: Every day | ORAL | 1 refills | Status: DC

## 2016-05-08 MED ORDER — LAMOTRIGINE 25 MG PO TABS: 50.0000 mg | ORAL_TABLET | Freq: Every day | ORAL | 1 refills | Status: DC

## 2016-05-08 MED ORDER — SULFAMETHOXAZOLE-TRIMETHOPRIM 800-160 MG PO TABS: 1.0000 | ORAL_TABLET | Freq: Two times a day (BID) | ORAL | 0 refills | Status: AC

## 2016-05-08 MED ORDER — FLUPHENAZINE HCL 10 MG PO TABS: 10.0000 mg | ORAL_TABLET | Freq: Two times a day (BID) | ORAL | 1 refills | Status: DC

## 2016-05-08 MED ORDER — FLUVOXAMINE MALEATE 50 MG PO TABS: 150.0000 mg | ORAL_TABLET | Freq: Every day | ORAL | 1 refills | Status: AC

## 2016-05-08 MED ORDER — FLUVOXAMINE MALEATE 50 MG PO TABS: 150.0000 mg | ORAL_TABLET | Freq: Every day | ORAL | 1 refills | Status: DC

## 2016-05-08 MED ORDER — OLANZAPINE 15 MG PO TABS: 15.0000 mg | ORAL_TABLET | Freq: Every day | ORAL | 1 refills | Status: AC

## 2016-05-08 MED ORDER — LAMOTRIGINE 25 MG PO TABS: 50.0000 mg | ORAL_TABLET | Freq: Every day | ORAL | 1 refills | Status: AC

## 2016-05-08 MED ORDER — FLUPHENAZINE HCL 10 MG PO TABS: 10.0000 mg | ORAL_TABLET | Freq: Two times a day (BID) | ORAL | 1 refills | Status: AC

## 2016-05-08 NOTE — [facility] Suicide Risk Assessment (Signed)
[facility] INPATIENT:  Family/Significant Other Suicide Prevention Education  Suicide Prevention Education:  Contact Attempts:Eurshla Steadman(sister [phone]),  has been identified by the patient as the family member/significant other with whom the patient will be residing, and identified as the person(s) who will aid the patient in the event of a mental health crisis.  With written consent from the patient, two attempts were made to provide suicide prevention education, prior to and/or following the patient's discharge.  We were unsuccessful in providing suicide prevention education.  A suicide education pamphlet was given to the patient to share with family/significant other.  Date and time of first attempt: 05/08/2016 / 11:55am; unable to leave voicemail Date and time of second attempt: 05/08/2016 / 3:58pm; unable to leave voicemail  Omari Koslosky [name] MSW, LCSWA 05/08/2016, 4:00 PM
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
## 2016-05-08 NOTE — Plan of Care (Signed)
Problem: Coping: Goal: Ability to cope will improve Outcome: Not Progressing Noted paranoid  With staff  And  Peers .

## 2016-05-08 NOTE — Progress Notes (Signed)
D: Angry at staff this am . Stated he feels they have disrespected him. Voice of feeling like a slave and does not want to continue to  Stay here. Patient stated he was going home to stay with his sister . Informed of discharge tomorrow am via [employer]. Patient continually at nursing station  About his  Discharge  .Stated appetite is good and energy level  Is normal. Stated concentration is good .  A: Encourage patient participation with unit programming . Instruction  Given on  Medication , verbalize understanding. R: Voice no other concerns. Staff continue to monitor

## 2016-05-08 NOTE — Progress Notes (Signed)
  [facility] Discharge [facility] :  Will you be returning to the same living situation after discharge:  Yes,  with sister At discharge, do you have transportation home?: No. Do you have the ability to pay for your medications: Yes,  patient has insurance  Release of information consent forms completed and in the chart;  Patient's signature needed at discharge.  Patient to Follow up at: Follow-up Information    [facility]. Go in 7 day(s).   Specialty:  Behavioral Health Why:  [facility] services including outpatient therapy and medication management within 7 days of discharge. Please bring insurance info, current medications, and discharge summary. Walk-in hours for new patients is M-F 8a-4p Contact information: [address] [place] [address] [phone]           Next level of care provider has access to [facility] Link:no  Safety Planning and Suicide Prevention discussed: Yes,  with patient  Have you used any form of tobacco in the last 30 days? (Cigarettes, Smokeless Tobacco, Cigars, and/or Pipes): No  Has patient been referred to the Quitline?: N/A patient is not a smoker  Patient has been referred for addiction treatment: Yes  Wellington Winegarden [name] MSW, LCSWA 05/08/2016, 4:19 PM

## 2016-05-08 NOTE — Progress Notes (Deleted)
Patient ID: Paulla DollyVincent Y Ortega, male   DOB: 06/02/1960, 55 y.o.   MRN: [id number]   Patient was irritable this morning stating he was ready to discharge and that he found a program in [place], [facility], that he wants to go to. CSW discussed transportation with patient. Patient began yelling stating "My sister can't pick me up, I don't have anyone who can help with that". CSW provided support to patient and tried to discuss discharge plans with patient. Patient stated "I don't need any help, I just need to get out of here. I am ready to go"!  Patient refused to attend treatment team to discuss resource options in preparation for his discharge.   CSW began process to request taxi voucher from [employer]. When CSW returned to unit patient stated  "I don't want to go there anymore, I am just going to go back to my sister's". Patient was very insistent that he needed to go on a certain bus at a certain time. CSW informed patient that we can assist with PART Bus ticket if [place].  Patient refused to sign ROI for [facility] [place].  Courtnie Brenes [name] MSW, LCSWA 05/08/2016 2:18 PM

## 2016-05-08 NOTE — [facility] Counselor (Signed)
Goals Group  Date/Time: 05/08/2016 9am  Type of Therapy and Topic: Group Therapy: Goals Group: SMART Goals   Pt was called, but did not attend   [place] F. Tiffanyann Deroo, LCSWA, LCAS  

## 2016-05-08 NOTE — Tx Team (Signed)
Interdisciplinary Treatment and Diagnostic Plan Update  05/08/2016 Time of Session: 10:30am [name] MRN: [id number]  Principal Diagnosis: Schizoaffective disorder, bipolar type [facility])  Secondary Diagnoses: Principal Problem:   Schizoaffective disorder, bipolar type (HCC) Active Problems:   Cocaine use disorder, moderate, dependence (HCC)   Substance induced mood disorder (HCC)   Noncompliance   Current Medications:  Current Facility-Administered Medications  Medication Dose Route Frequency Provider Last Rate Last Dose  . acetaminophen (TYLENOL) tablet 650 mg  650 mg Oral Q6H PRN Shari Prows, MD   650 mg at 05/07/16 1257  . alum & mag hydroxide-simeth (MAALOX/MYLANTA) 200-200-20 MG/5ML suspension 30 mL  30 mL Oral Q4H PRN Audery Amel, MD      . fluPHENAZine (PROLIXIN) tablet 10 mg  10 mg Oral BID AC Jolanta B Pucilowska, MD   10 mg at 05/08/16 0617  . fluvoxaMINE (LUVOX) tablet 150 mg  150 mg Oral QHS Shari Prows, MD   150 mg at 05/07/16 2125  . guaiFENesin (MUCINEX) 12 hr tablet 600 mg  600 mg Oral BID Jolanta B Pucilowska, MD   600 mg at 05/08/16 0617  . hydrOXYzine (ATARAX/VISTARIL) tablet 100 mg  100 mg Oral QHS Shari Prows, MD   100 mg at 05/07/16 2126  . ibuprofen (ADVIL,MOTRIN) tablet 800 mg  800 mg Oral Q6H PRN Jolanta B Pucilowska, MD   800 mg at 05/08/16 1300  . lamoTRIgine (LAMICTAL) tablet 50 mg  50 mg Oral QHS Shari Prows, MD   50 mg at 05/07/16 2126  . magnesium hydroxide (MILK OF MAGNESIA) suspension 30 mL  30 mL Oral Daily PRN Audery Amel, MD      . nystatin (MYCOSTATIN/NYSTOP) topical powder   Topical BID Shari Prows, MD   1 g at 05/08/16 0843  . OLANZapine (ZYPREXA) tablet 15 mg  15 mg Oral QHS Shari Prows, MD   15 mg at 05/07/16 2126  . sulfamethoxazole-trimethoprim (BACTRIM DS,SEPTRA DS) 800-160 MG per tablet 1 tablet  1 tablet Oral Q12H Jimmy Footman, MD   1 tablet at 05/08/16 4098   PTA  Medications: Prescriptions Prior to Admission  Medication Sig Dispense Refill Last Dose  . FLUoxetine (PROZAC) 20 MG capsule Take 1 capsule (20 mg total) by mouth daily. (Patient not taking: Reported on 04/29/2016) 30 capsule 0 Not Taking at Unknown time  . hydrOXYzine (ATARAX/VISTARIL) 50 MG tablet Take 1 tablet (50 mg total) by mouth at bedtime. (Patient not taking: Reported on 04/29/2016) 30 tablet 0 Not Taking at Unknown time  . hydrOXYzine (ATARAX/VISTARIL) 50 MG tablet Take 1 tablet (50 mg total) by mouth at bedtime. (Patient not taking: Reported on 04/29/2016) 30 tablet 0 Not Taking at Unknown time  . lurasidone (LATUDA) 80 MG TABS tablet Take 1 tablet (80 mg total) by mouth every evening. (Patient not taking: Reported on 04/29/2016) 30 tablet 0 Not Taking at Unknown time  . valACYclovir (VALTREX) 500 MG tablet Take 1 tablet (500 mg total) by mouth 2 (two) times daily. 60 tablet 0 prn at prn    Patient Stressors: Educational concerns Medication change or noncompliance Substance abuse  Patient Strengths: Ability for insight Average or above average Radio producer for treatment/growth Religious Affiliation  Treatment Modalities: Medication Management, Group therapy, Case management,  1 to 1 session with clinician, Psychoeducation, Recreational therapy.   Physician Treatment Plan for Primary Diagnosis: Schizoaffective disorder, bipolar type (HCC) Long Term Goal(s): Improvement in symptoms so as ready for  discharge Improvement in symptoms so as ready for discharge   Short Term Goals: Ability to identify changes in lifestyle to reduce recurrence of condition will improve Ability to verbalize feelings will improve Ability to disclose and discuss suicidal ideas Ability to demonstrate self-control will improve Ability to identify and develop effective coping behaviors will improve Ability to maintain clinical measurements within normal limits will  improve Compliance with prescribed medications will improve Ability to identify changes in lifestyle to reduce recurrence of condition will improve Ability to demonstrate self-control will improve Ability to identify triggers associated with substance abuse/mental health issues will improve  Medication Management: Evaluate patient's response, side effects, and tolerance of medication regimen.  Therapeutic Interventions: 1 to 1 sessions, Unit Group sessions and Medication administration.  Evaluation of Outcomes: Adequate for Discharge  Physician Treatment Plan for Secondary Diagnosis: Principal Problem:   Schizoaffective disorder, bipolar type (HCC) Active Problems:   Cocaine use disorder, moderate, dependence (HCC)   Substance induced mood disorder (HCC)   Noncompliance  Long Term Goal(s): Improvement in symptoms so as ready for discharge Improvement in symptoms so as ready for discharge   Short Term Goals: Ability to identify changes in lifestyle to reduce recurrence of condition will improve Ability to verbalize feelings will improve Ability to disclose and discuss suicidal ideas Ability to demonstrate self-control will improve Ability to identify and develop effective coping behaviors will improve Ability to maintain clinical measurements within normal limits will improve Compliance with prescribed medications will improve Ability to identify changes in lifestyle to reduce recurrence of condition will improve Ability to demonstrate self-control will improve Ability to identify triggers associated with substance abuse/mental health issues will improve     Medication Management: Evaluate patient's response, side effects, and tolerance of medication regimen.  Therapeutic Interventions: 1 to 1 sessions, Unit Group sessions and Medication administration.  Evaluation of Outcomes: Adequate for Discharge   RN Treatment Plan for Primary Diagnosis: Schizoaffective disorder, bipolar  type (HCC) Long Term Goal(s): Knowledge of disease and therapeutic regimen to maintain health will improve  Short Term Goals: Ability to demonstrate self-control, Ability to participate in decision making will improve, Ability to verbalize feelings will improve, Ability to identify and develop effective coping behaviors will improve and Compliance with prescribed medications will improve  Medication Management: RN will administer medications as ordered by provider, will assess and evaluate patient's response and provide education to patient for prescribed medication. RN will report any adverse and/or side effects to prescribing provider.  Therapeutic Interventions: 1 on 1 counseling sessions, Psychoeducation, Medication administration, Evaluate responses to treatment, Monitor vital signs and CBGs as ordered, Perform/monitor CIWA, COWS, AIMS and Fall Risk screenings as ordered, Perform wound care treatments as ordered.  Evaluation of Outcomes: Adequate for Discharge   LCSW Treatment Plan for Primary Diagnosis: Schizoaffective disorder, bipolar type (HCC) Long Term Goal(s): Safe transition to appropriate next level of care at discharge, Engage patient in therapeutic group addressing interpersonal concerns.  Short Term Goals: Engage patient in aftercare planning with referrals and resources, Facilitate acceptance of mental health diagnosis and concerns, Facilitate patient progression through stages of change regarding substance use diagnoses and concerns and Increase skills for wellness and recovery  Therapeutic Interventions: Assess for all discharge needs, 1 to 1 time with Social worker, Explore available resources and support systems, Assess for adequacy in community support network, Educate family and significant other(s) on suicide prevention, Complete Psychosocial Assessment, Interpersonal group therapy.  Evaluation of Outcomes: Adequate for Discharge   Progress in Treatment:  Attending  groups: Yes. Participating in groups: Yes. Taking medication as prescribed: Yes. Toleration medication: Yes. Family/Significant other contact made: CSW attempted contact with Sister, [name] and documented.  Patient understands diagnosis: Yes. Discussing patient identified problems/goals with staff: Yes. Medical problems stabilized or resolved: Yes. Denies suicidal/homicidal ideation: Yes. Issues/concerns per patient self-inventory: No. Other: n/a  New problem(s) identified: None identified at this time.  New Short Term/Long Term Goal(s): None identified at this time.   Discharge Plan or Barriers: Patient will D/C to [place] to live with sister and follow-up with outpatient services.   Reason for Continuation of Hospitalization: Medication stabilization  Estimated Length of Stay: Anticipated D/C 05/09/2016  Attendees: Patient:[name] 05/08/2016 4:01 PM  Physician: Dr. Jennet MaduroPucilowska, MD 05/08/2016 4:01 PM  Nursing: Hulan AmatoGwen Farrish, RN 05/08/2016 4:01 PM  RN Care Manager: 05/08/2016 4:01 PM  Social Worker: Fredrich BirksAmaris [name] MSW, LCSWA 05/08/2016 4:01 PM  Recreational Therapist:  05/08/2016 4:01 PM  Other:  05/08/2016 4:01 PM  Other:  05/08/2016 4:01 PM  Other: 05/08/2016 4:01 PM    Scribe for Treatment Team: Arelia LongestAmaris G Renu Asby, LCSWA 05/08/2016 4:05 PM

## 2016-05-08 NOTE — [facility] Group Notes (Signed)
[facility] LCSW Group Therapy   05/08/2016 9:30am   Type of Therapy: Group Therapy   Participation Level: Pt invited but did not attend.  Participation Quality: Pt invited but did not attend.    Hampton AbbotKadijah Marsella Suman, MSW, LCSWA 05/08/2016, 10:45AM

## 2016-05-09 NOTE — [facility] Suicide Risk Assessment (Signed)
[facility]   Principal Problem: Schizoaffective disorder, bipolar type [facility](HCC) Discharge Diagnoses:  Patient Active Problem List   Diagnosis Date Noted  . Noncompliance [Z91.19] 04/30/2016  . Schizoaffective disorder, bipolar type (HCC) [F25.0]   . Balanitis [N48.1] 03/26/2015  . Anxiety [F41.9]   . Substance induced mood disorder (HCC) [F19.94] 12/08/2013  . Cocaine use disorder, moderate, dependence (HCC) [F14.20] 06/29/2012  . Alcohol use disorder, moderate, dependence (HCC) [F10.20] 06/29/2012  . Bipolar I disorder, most recent episode depressed, severe without psychotic features (HCC) [F31.4] 04/22/2012    Class: Acute    Total Time spent with patient: 30 minutes  Musculoskeletal: Strength & Muscle Tone: within normal limits Gait & Station: normal Patient leans: N/A  Psychiatric Specialty Exam: Review of Systems  Psychiatric/Behavioral: Positive for substance abuse.  All other systems reviewed and are negative.   Blood pressure (!) 107/55, pulse 75, temperature 98.1 F (36.7 C), temperature source Oral, resp. rate 18, height 6\' 3"  (1.905 m), weight 99.8 kg (220 lb), SpO2 100 %.Body mass index is 27.5 kg/m.  General Appearance: Casual  Eye Contact::  Good  Speech:  Clear and Coherent409  Volume:  Normal  Mood:  Anxious  Affect:  Appropriate  Thought Process:  Goal Directed and Descriptions of Associations: Intact  Orientation:  Full (Time, Place, and Person)  Thought Content:  WDL  Suicidal Thoughts:  No  Homicidal Thoughts:  No  Memory:  Immediate;   Fair Recent;   Fair Remote;   Fair  Judgement:  Impaired  Insight:  Shallow  Psychomotor Activity:  Normal  Concentration:  Fair  Recall:  [name]  Fund of Knowledge:Fair  Language: Fair  Akathisia:  No  Handed:  Right  AIMS (if indicated):     Assets:  Communication Skills Desire for Improvement Financial Resources/Insurance Physical Health Resilience Social Support  Sleep:   Number of Hours: 7.5  Cognition: WNL  ADL's:  Intact   Mental Status Per Nursing Assessment::   On Admission:     Demographic Factors:  Male and Low socioeconomic status  Loss Factors: NA  Historical Factors: Impulsivity  Risk Reduction Factors:   Sense of responsibility to family and Positive social support  Continued Clinical Symptoms:  Bipolar Disorder:   Depressive phase Alcohol/Substance Abuse/Dependencies  Cognitive Features That Contribute To Risk:  None    Suicide Risk:  Minimal: No identifiable suicidal ideation.  Patients presenting with no risk factors but with morbid ruminations; may be classified as minimal risk based on the severity of the depressive symptoms  Follow-up Information    [facility]   Specialty:  Behavioral Health Why:  [facility] including outpatient therapy and medication management within 7 days of discharge. Please bring insurance info, current medications, and discharge summary. Walk-in hours for new patients is M-F 8a-4p Contact information: [address] [place] [address] [phone]           Plan Of Care/Follow-up recommendations:  Activity:  as tolerated Diet:  low sodium heart healthy Other:  keep follow up appointments  Kristine LineaJolanta Teiara Baria, MD 05/09/2016, 8:27 AM

## 2016-05-09 NOTE — Progress Notes (Signed)
Patient discharged home. DC instructions provided and explained. Medications reviewed. Rx given. All questions answered. Pt denies SI, HI, AVH. 7 day supply given. All questions answered. Pt stable at discharge.

## 2016-05-09 NOTE — [facility] Group Notes (Signed)
[facility] Group Notes:  (Nursing/MHT/Case Management/Adjunct)  Date:  05/09/2016  Time:  6:00 AM  Type of Therapy:  Psychoeducational Skills  Participation Level:  Did Not Attend   Summary of Progress/Problems:  Perry MilroyLaquanda Y Roise [name] 05/09/2016, 6:00 AM
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
## 2016-05-09 NOTE — Progress Notes (Signed)
D: Patient appears flat and depressed. Denies SI/HI/AVH. States he's ready to discharge. Refused Luvox, Zyprexa, and Lamictal. Stated, "she's got me on too many drugs anyway." Also stated he doesn't know what medications he's going to stay on after discharge. Patient c/o pain  A: Medication given with education. Encouragement provided.  R: Patient was compliant with some medications. Safety maintained with 15 min checks.

## 2017-04-13 DIAGNOSIS — B349 Viral infection, unspecified: Principal | ICD-10-CM

## 2017-04-13 NOTE — ED Triage Notes (Signed)
Pt left [facility] 2 days ago and is no c/o fever and "uti", generalized achiness. States he drank and used cocaine 2 days ago.

## 2017-04-14 ENCOUNTER — Inpatient Hospital Stay
Admit: 2017-04-14 | Discharge: 2017-04-16 | Disposition: A | Discharge status: Home / Self Care | Payer: MEDICAID | Attending: Internal Medicine | Admitting: Internal Medicine

## 2017-04-14 ENCOUNTER — Inpatient Hospital Stay: Admit: 2017-04-14 | Payer: MEDICAID

## 2017-04-14 LAB — URINALYSIS W/ RFLX MICROSCOPIC
Bilirubin: NEGATIVE
Glucose: NEGATIVE mg/dL
Ketone: NEGATIVE mg/dL
Nitrites: NEGATIVE
Protein: NEGATIVE mg/dL
Specific gravity: 1.01 (ref 1.003–1.030)
Urobilinogen: 0.2 EU/dL (ref 0.2–1.0)
pH (UA): 6 (ref 5.0–7.0)

## 2017-04-14 LAB — METABOLIC PANEL, COMPREHENSIVE
A-G Ratio: 0.9 — ABNORMAL LOW (ref 1.0–3.1)
ALT (SGPT): 30 U/L (ref 12–78)
AST (SGOT): 22 U/L (ref 15–37)
Albumin: 3.1 g/dL — ABNORMAL LOW (ref 3.4–5.0)
Alk. phosphatase: 84 U/L (ref 46–116)
Anion gap: 12 mmol/L (ref 10–17)
BUN/Creatinine ratio: 14 (ref 6.0–20.0)
BUN: 18 MG/DL (ref 7–18)
Bilirubin, total: 0.7 MG/DL (ref 0.2–1.0)
CO2: 29 mmol/L (ref 21–32)
Calcium: 8.5 MG/DL (ref 8.5–10.1)
Chloride: 100 mmol/L (ref 98–107)
Creatinine: 1.25 MG/DL (ref 0.6–1.3)
GFR est AA: >60 mL/min/{1.73_m2} (ref >60)
GFR est non-AA: >60 mL/min/{1.73_m2} (ref >60)
Globulin: 3.4 g/dL
Glucose: 111 mg/dL — ABNORMAL HIGH (ref 74–106)
Potassium: 3.9 mmol/L (ref 3.5–5.1)
Protein, total: 6.5 g/dL (ref 6.4–8.2)
Sodium: 137 mmol/L (ref 136–145)

## 2017-04-14 LAB — INFLUENZA A & B AG (RAPID TEST)
Influenza A Ag: NEGATIVE
Influenza B Ag: NEGATIVE

## 2017-04-14 LAB — DRUG SCREEN, URINE
AMPHETAMINES: NEGATIVE
BARBITURATES: NEGATIVE
BENZODIAZEPINES: POSITIVE — AB
Buprenorphine screen, urine: NEGATIVE
COCAINE: POSITIVE — AB
METHADONE: NEGATIVE
Methamphetamines: NEGATIVE
OPIATES: NEGATIVE
OXYCODONE SCREEN: NEGATIVE
PCP(PHENCYCLIDINE): NEGATIVE
PROPOXYPHENE: NEGATIVE
THC (TH-CANNABINOL): NEGATIVE
TRICYCLICS: NEGATIVE

## 2017-04-14 LAB — CBC WITH AUTOMATED DIFF
ABS. BASOPHILS: 0 10*3/uL (ref 0.0–0.2)
ABS. EOSINOPHILS: 0.3 10*3/uL (ref 0.0–0.7)
ABS. LYMPHOCYTES: 0.8 10*3/uL — ABNORMAL LOW (ref 1.2–3.4)
ABS. MONOCYTES: 0.5 10*3/uL — ABNORMAL LOW (ref 1.1–3.2)
ABS. NEUTROPHILS: 8.3 10*3/uL — ABNORMAL HIGH (ref 1.4–6.5)
BASOPHILS: 0 % (ref 0–2)
EOSINOPHILS: 3 % (ref 0–5)
HCT: 42 % (ref 36.8–45.2)
HGB: 13.2 g/dL (ref 12.8–15.0)
IMMATURE GRANULOCYTES: 0 % (ref 0.0–5.0)
LYMPHOCYTES: 8 % — ABNORMAL LOW (ref 16–40)
MCH: 29.7 PG (ref 27–31)
MCHC: 31.4 g/dL — ABNORMAL LOW (ref 32–36)
MCV: 94.6 FL (ref 81–99)
MONOCYTES: 5 % (ref 0–12)
MPV: 9.7 FL (ref 7.4–10.4)
NEUTROPHILS: 84 % — ABNORMAL HIGH (ref 40–70)
PLATELET: 160 10*3/uL (ref 140–450)
RBC: 4.44 M/uL (ref 4.0–5.2)
RDW: 13.2 % (ref 11.5–14.5)
WBC: 9.9 10*3/uL (ref 4.8–10.8)

## 2017-04-14 LAB — URINE MICROSCOPIC
Casts: NONE SEEN /lpf
Crystals, urine: NONE SEEN /LPF
RBC: 0 /hpf
WBC: 0 /hpf

## 2017-04-14 LAB — LACTIC ACID: Lactic acid: 0.9 MMOL/L (ref 0.4–2.0)

## 2017-04-14 MED ORDER — SODIUM CHLORIDE 0.9% BOLUS IV
0.9 % | Freq: Once | INTRAVENOUS | Status: AC
Start: 2017-04-14 — End: 2017-04-14
  Administered 2017-04-14: 06:00:00 via INTRAVENOUS

## 2017-04-14 MED ORDER — ARIPIPRAZOLE 5 MG TAB
5 mg | Freq: Every day | ORAL | Status: DC
Start: 2017-04-14 — End: 2017-04-16
  Administered 2017-04-14 – 2017-04-16 (×3): via ORAL

## 2017-04-14 MED ORDER — FOLIC ACID 1 MG TAB
1 mg | Freq: Every day | ORAL | Status: DC
Start: 2017-04-14 — End: 2017-04-16
  Administered 2017-04-14 – 2017-04-16 (×3): via ORAL

## 2017-04-14 MED ORDER — CIPROFLOXACIN IN D5W 400 MG/200 ML IV PIGGY BACK
400 mg/200 mL | Freq: Two times a day (BID) | INTRAVENOUS | Status: DC
Start: 2017-04-14 — End: 2017-04-16
  Administered 2017-04-14 – 2017-04-16 (×4): via INTRAVENOUS

## 2017-04-14 MED ORDER — CHLORDIAZEPOXIDE 25 MG CAP
25 mg | ORAL | Status: DC
Start: 2017-04-14 — End: 2017-04-14
  Administered 2017-04-14: 19:00:00 via ORAL

## 2017-04-14 MED ORDER — NALOXONE 0.4 MG/ML INJECTION
0.4 mg/mL | INTRAMUSCULAR | Status: DC | PRN
Start: 2017-04-14 — End: 2017-04-16

## 2017-04-14 MED ORDER — ACETAMINOPHEN 500 MG TAB
500 mg | ORAL | Status: AC
Start: 2017-04-14 — End: 2017-04-14
  Administered 2017-04-14: 08:00:00 via ORAL

## 2017-04-14 MED ORDER — CIPROFLOXACIN IN D5W 400 MG/200 ML IV PIGGY BACK
400 mg/200 mL | INTRAVENOUS | Status: AC
Start: 2017-04-14 — End: 2017-04-14
  Administered 2017-04-14: 10:00:00 via INTRAVENOUS

## 2017-04-14 MED ORDER — ONDANSETRON (PF) 4 MG/2 ML INJECTION
4 mg/2 mL | Freq: Four times a day (QID) | INTRAMUSCULAR | Status: DC | PRN
Start: 2017-04-14 — End: 2017-04-16

## 2017-04-14 MED ORDER — TRIMETHOPRIM-SULFAMETHOXAZOLE 160 MG-800 MG TAB
160-800 mg | ORAL | Status: DC
Start: 2017-04-14 — End: 2017-04-14

## 2017-04-14 MED ORDER — KETOROLAC TROMETHAMINE 30 MG/ML INJECTION
30 mg/mL (1 mL) | INTRAMUSCULAR | Status: AC
Start: 2017-04-14 — End: 2017-04-14
  Administered 2017-04-14: 07:00:00 via INTRAVENOUS

## 2017-04-14 MED ORDER — DICYCLOMINE 10 MG CAP
10 mg | Freq: Four times a day (QID) | ORAL | Status: DC | PRN
Start: 2017-04-14 — End: 2017-04-16

## 2017-04-14 MED ORDER — SODIUM CHLORIDE 0.9 % IV
INTRAVENOUS | Status: DC
Start: 2017-04-14 — End: 2017-04-16
  Administered 2017-04-14 – 2017-04-16 (×4): via INTRAVENOUS

## 2017-04-14 MED ORDER — THIAMINE 100 MG/ML INJECTION
100 mg/mL | Freq: Every day | INTRAMUSCULAR | Status: DC
Start: 2017-04-14 — End: 2017-04-16
  Administered 2017-04-14: 15:00:00 via INTRAMUSCULAR

## 2017-04-14 MED ORDER — KETOROLAC TROMETHAMINE 30 MG/ML INJECTION
30 mg/mL (1 mL) | INTRAMUSCULAR | Status: AC
Start: 2017-04-14 — End: 2017-04-14
  Administered 2017-04-14: 06:00:00 via INTRAVENOUS

## 2017-04-14 MED ORDER — CIPROFLOXACIN IN D5W 400 MG/200 ML IV PIGGY BACK
400 mg/200 mL | Freq: Two times a day (BID) | INTRAVENOUS | Status: DC
Start: 2017-04-14 — End: 2017-04-14

## 2017-04-14 MED ORDER — METOPROLOL TARTRATE 5 MG/5 ML IV SOLN
5 mg/ mL | INTRAVENOUS | Status: DC | PRN
Start: 2017-04-14 — End: 2017-04-16

## 2017-04-14 MED ORDER — DIPHENHYDRAMINE HCL 50 MG/ML IJ SOLN
50 mg/mL | INTRAMUSCULAR | Status: DC | PRN
Start: 2017-04-14 — End: 2017-04-16

## 2017-04-14 MED ORDER — HEPARIN (PORCINE) 5,000 UNIT/ML IJ SOLN
5000 unit/mL | Freq: Three times a day (TID) | INTRAMUSCULAR | Status: DC
Start: 2017-04-14 — End: 2017-04-16
  Administered 2017-04-15 (×2): via SUBCUTANEOUS

## 2017-04-14 MED ORDER — DIAZEPAM 5 MG/ML SYRINGE
5 mg/mL | INTRAMUSCULAR | Status: DC | PRN
Start: 2017-04-14 — End: 2017-04-15

## 2017-04-14 MED ORDER — DOCUSATE SODIUM 100 MG CAP
100 mg | Freq: Two times a day (BID) | ORAL | Status: DC
Start: 2017-04-14 — End: 2017-04-16
  Administered 2017-04-14 – 2017-04-16 (×5): via ORAL

## 2017-04-14 MED ORDER — ACETAMINOPHEN 325 MG TABLET
325 mg | ORAL | Status: DC | PRN
Start: 2017-04-14 — End: 2017-04-16
  Administered 2017-04-14 (×2): via ORAL

## 2017-04-14 MED ORDER — IBUPROFEN 400 MG TAB
400 mg | ORAL | Status: DC | PRN
Start: 2017-04-14 — End: 2017-04-16
  Administered 2017-04-15 – 2017-04-16 (×4): via ORAL

## 2017-04-14 MED ORDER — CHLORDIAZEPOXIDE 25 MG CAP
25 mg | ORAL | Status: DC
Start: 2017-04-14 — End: 2017-04-14

## 2017-04-14 MED ORDER — SODIUM CHLORIDE 0.9% BOLUS IV
0.9 % | Freq: Once | INTRAVENOUS | Status: AC
Start: 2017-04-14 — End: 2017-04-14
  Administered 2017-04-14: 08:00:00 via INTRAVENOUS

## 2017-04-14 MED FILL — ARIPIPRAZOLE 5 MG TAB: 5 mg | ORAL | Qty: 1

## 2017-04-14 MED FILL — THIAMINE 100 MG/ML INJECTION: 100 mg/mL | INTRAMUSCULAR | Qty: 2

## 2017-04-14 MED FILL — TYLENOL 325 MG TABLET: 325 mg | ORAL | Qty: 2

## 2017-04-14 MED FILL — SODIUM CHLORIDE 0.9 % IV: INTRAVENOUS | Qty: 1000

## 2017-04-14 MED FILL — FOLIC ACID 1 MG TAB: 1 mg | ORAL | Qty: 1

## 2017-04-14 MED FILL — CIPROFLOXACIN IN D5W 400 MG/200 ML IV PIGGY BACK: 400 mg/200 mL | INTRAVENOUS | Qty: 200

## 2017-04-14 MED FILL — CHLORDIAZEPOXIDE 25 MG CAP: 25 mg | ORAL | Qty: 2

## 2017-04-14 MED FILL — DOCUSATE SODIUM 100 MG CAP: 100 mg | ORAL | Qty: 1

## 2017-04-14 MED FILL — MAPAP EXTRA STRENGTH 500 MG TABLET: 500 mg | ORAL | Qty: 2

## 2017-04-14 MED FILL — KETOROLAC TROMETHAMINE 30 MG/ML INJECTION: 30 mg/mL (1 mL) | INTRAMUSCULAR | Qty: 1

## 2017-04-14 NOTE — Progress Notes (Signed)
Care Management Interventions  PCP Verified by CM: Yes  Palliative Care Criteria Met (RRAT>21 & CHF Dx)?: No  Mode of Transport at Discharge: Self  Transition of Care Consult (CM Consult): Discharge Planning  MyChart Signup: No  Discharge Durable Medical Equipment: No  Physical Therapy Consult: No  Occupational Therapy Consult: No  Speech Therapy Consult: No  Current Support Network: Shelter  Confirm Follow Up Transport: Self  Plan discussed with Pt/Family/Caregiver: Counselling psychologist Information Provided?: No  Discharge Location  Discharge Placement: Shelter    Pt's assessment completed at bedside, pt is alert and oriented x 3 , pt is from [facility] and would be transfering to the shelter upon discharge.Pt stated that he has no PCP and has no pharmacy in use. Pt is ambulatory, has no issues, he stated. Pt will need help with prescription upon discharge and will need bus token also.Pt's listed address is his mother's address and will not go there when discharge.

## 2017-04-14 NOTE — Progress Notes (Addendum)
Pt received from ED Dx UTI with Fever  With order for IV Cipro  alert oriented x3,ambulatory,on CIWA Protocol Score 1.      Visit Vitals  BP 109/66 (BP 1 Location: Left arm, BP Patient Position: Lying right side)   Pulse 84   Temp 99 ??F (37.2 ??C)   Resp 20   Ht 6\' 3"  (1.905 m)   Wt 110.2 kg (242 lb 15.2 oz)   SpO2 97%   BMI 30.37 kg/m??

## 2017-04-14 NOTE — Progress Notes (Signed)
Hospitalist Progress Note           Daily Progress Note: 04/14/2017      Nursing notes reviewed.    Admitted for fevers/body aches. Recent UTI.  C/o fevers/chills, diffuse body aches. Denies dysuria/diarrhea      Review of Systems:   All other systems negative on 10 systems reviewed except for as stated in subjective.    Objective:     Medications reviewed:  Current Facility-Administered Medications   Medication Dose Route Frequency Provider Last Rate Last Dose   ??? acetaminophen (TYLENOL) tablet 650 mg  650 mg Oral Q4H PRN Britt Bolognese, MD   650 mg at 04/14/17 1014   ??? ibuprofen (MOTRIN) tablet 400 mg  400 mg Oral Q4H PRN Britt Bolognese, MD       ??? dicyclomine (BENTYL) capsule 10 mg  10 mg Oral Q6H PRN Britt Bolognese, MD       ??? naloxone [facility]) injection 0.4 mg  0.4 mg IntraVENous PRN Britt Bolognese, MD       ??? diphenhydrAMINE (BENADRYL) injection 12.5 mg  12.5 mg IntraVENous Q4H PRN Britt Bolognese, MD       ??? docusate sodium (COLACE) capsule 100 mg  100 mg Oral BID Britt Bolognese, MD   100 mg at 04/14/17 1005   ??? thiamine (B-1) injection 100 mg  100 mg IntraMUSCular DAILY Britt Bolognese, MD   100 mg at 04/14/17 1005   ??? chlordiazePOXIDE (LIBRIUM) capsule 50 mg  50 mg Oral Q4H Britt Bolognese, MD       ??? [START ON 04/15/2017] chlordiazePOXIDE (LIBRIUM) capsule 25 mg  25 mg Oral Q4H Britt Bolognese, MD       ??? diazePAM (VALIUM) injection 10 mg  10 mg IntraVENous Q5MIN PRN Britt Bolognese, MD       ??? metoprolol (LOPRESSOR) injection 5 mg  5 mg IntraVENous Q4H PRN Britt Bolognese, MD       ??? ondansetron [facility]) injection 4 mg  4 mg IntraVENous Q6H PRN Britt Bolognese, MD       ??? ARIPiprazole (ABILIFY) tablet 5 mg  5 mg Oral DAILY Britt Bolognese, MD   5 mg at 04/14/17 1014   ??? 0.9% sodium chloride infusion  150 mL/hr IntraVENous CONTINUOUS Britt Bolognese, MD 150 mL/hr at 04/14/17 1006 150 mL/hr at 04/14/17 1006   ??? ciprofloxacin (CIPRO) 400 mg IVPB (premix)  400 mg IntraVENous Q12H  Britt Bolognese, MD             Physical Exam:     Visit Vitals  BP 109/66 (BP 1 Location: Left arm, BP Patient Position: Lying right side)   Pulse 84   Temp 99 ??F (37.2 ??C)   Resp 20   Ht 6\' 3"  (1.905 m)   Wt 110.2 kg (242 lb 15.2 oz)   SpO2 97%   BMI 30.37 kg/m??      O2 Device: Room air    Temp (24hrs), Avg:99.7 ??F (37.6 ??C), Min:98.6 ??F (37 ??C), Max:100.9 ??F (38.3 ??C)    No intake/output data recorded.   11/18 1901 - 11/20 0700  In: 2200 [I.V.:2200]  Out: -     General:   HEENT:  No acute distress. Appears stated age.   NC/AT. Neck supple. Trachea midline. PEERLA, EOMI, no icterus.   Lungs:    Clear to auscultation bilaterally. No wheezing/rales/rhonchi. No accessory muscle use. No tachypnea.   Chest wall:   No tenderness or deformity.   Heart:  Regular rate and rhythm,  S1, S2 normal, no murmurs, clicks, rubs or gallops. No JVD. No carotid bruits.    Abdomen:   Soft, non-tender/non-distended. Bowel sounds normal. No masses,  No organomegaly. No guarding or rigidity   Extremities: Extremities normal, atraumatic, no cyanosis or edema.   Pulses: 2+ and symmetric all extremities.   Skin: Skin color, texture, turgor normal. No rashes or lesions   Neurologic:     CNII-XII intact. No focal motor or sensory deficits appreciated. AAOx3.       Lines, Drains, and Airways:    Peripheral IV 04/13/17 Left Antecubital (Active)   Site Assessment Clean, dry, & intact 04/14/2017  9:15 AM   Phlebitis Assessment 0 04/14/2017  9:15 AM   Infiltration Assessment 0 04/14/2017  9:15 AM   Dressing Status Clean, dry, & intact 04/14/2017  9:15 AM   Dressing Type Tape;Transparent 04/14/2017  9:15 AM   Hub Color/Line Status Pink 04/14/2017  9:15 AM   Action Taken Dressing reinforced 04/14/2017  9:15 AM   Alcohol Cap Used Yes 04/14/2017  9:15 AM       Data Review:       Recent Days:  Recent Labs     04/14/17  0045   WBC 9.9   HGB 13.2   HCT 42.0   PLT 160     Recent Labs     04/14/17  0045   NA 137   K 3.9   CL 100   CO2 29   GLU 111*    BUN 18   CREA 1.25   CA 8.5   ALB 3.1*   SGOT 22   ALT 30     No results for input(s): PH, PCO2, PO2, HCO3, FIO2 in the last 72 hours.    Xr Chest Port    Result Date: 04/14/2017  EXAM:  XR CHEST PORT dated 04/14/2017 10:36 AM INDICATION:  fever COMPARISON: None. FINDINGS: A portable AP semierect radiograph of the chest was obtained at 0955 hours. The lungs are moderately well expanded and clear of infiltrate. No evidence of failure or effusion. Heart size normal. No adenopathy. Old healed left rib fracture deformities.     IMPRESSION:  No evidence of active disease.       Problem List:  Hospital Problems  Never Reviewed          Codes Class Noted POA    Alcohol withdrawal (HCC) ICD-10-CM: V78.469F10.239  ICD-9-CM: 291.81  04/14/2017 Unknown        Fever ICD-10-CM: R50.9  ICD-9-CM: 780.60  04/14/2017 Unknown        Complicated UTI (urinary tract infection) ICD-10-CM: N39.0  ICD-9-CM: 599.0  04/14/2017 Unknown        UTI (urinary tract infection) ICD-10-CM: N39.0  ICD-9-CM: 599.0  04/14/2017 Unknown              All of the above diagnoses were present on admission.    Assessment/Plan         #Fever: will send pancultures. CXR . Rapid flu negative. Conitnue ciproflxacin for recent UTI. Contuinue IVF    #Alcohol abuse: watch for withdrawa, thiaminem folic acid supplementation, IVF    #BIpolar disorder: continue current meds    #DVT ppx: heparin          Care Plan discussed with: Patient/Family, Nurse and Case Manager in detail. Expressed understanding and is in agreement.      Condition:  fair    Prognosis: fair    Disposition: floor  Disposition:   Expected length of stay: 2-3 day(s)      Total time spent with patient: 35 minutes.    Nat MathMoon Yakir Wenke, [place]  04/14/2017

## 2017-04-14 NOTE — ED Provider Notes (Signed)
[name] [name] is a 56 y.o. male with hx of ETOH dependence, bipolar disorder and PTSD who presents to the ED with complaint of fever, generalized body aches and UTI. Pt [facility] and was being treated for a UTI. Pt was being treated with keflex (is on day 3 of medication). [facility], pt developed flu like sx including fever/chills and generalized body aches so he came here. [id number]([phone]) stating pt had temp of 102.1, pulse of 114, respiratory rate of 102 despite several doses of tylenol and motrin given approximately 1 hour ago. Pt is on MV, thiamine 100 mg po every day, folate every day, keflex 500 mg BID x 10 days, Librium 50 mg QHS, benadryl 100 mg QHS, Abilify 5 mg every day, Librium 25 mg TID 11/20, and librium 25 mg every day on 11/21. Pt notes he is going through alcohol withdrawal. His last alcohol use was 2 days ago and he was brought to a program last night. No rhinorrhea, sore throat, cough, HA, sob, n/v/d, or cp.         The history is provided by the patient.   Fever    This is a new problem. The current episode started 1 to 2 hours ago. The problem occurs constantly. The problem has not changed since onset.The maximum temperature noted was 100 - 100.9 F. Associated symptoms include muscle aches. Pertinent negatives include no chest pain, no diarrhea, no vomiting, no congestion, no headaches, no sore throat, no cough, no shortness of breath and no rash.        Past Medical History:   Diagnosis Date   ??? Bipolar 1 disorder (HCC)    ??? Bipolar affective (HCC)    ??? Drug abuse (HCC)    ??? ETOH abuse    ??? PTSD (post-traumatic stress disorder)        No past surgical history on file.      No family history on file.    Social History     Socioeconomic History   ??? Marital status: SINGLE     Spouse name: Not on file   ??? Number of children: Not on file   ??? Years of education: Not on file   ??? Highest education level: Not on file   Social Needs    ??? Financial resource strain: Not on file   ??? Food insecurity - worry: Not on file   ??? Food insecurity - inability: Not on file   ??? Transportation needs - medical: Not on file   ??? Transportation needs - non-medical: Not on file   Occupational History   ??? Not on file   Tobacco Use   ??? Smoking status: Not on file   Substance and Sexual Activity   ??? Alcohol use: Not on file   ??? Drug use: Not on file   ??? Sexual activity: Not on file   Other Topics Concern   ??? Not on file   Social History Narrative   ??? Not on file         ALLERGIES: Lamictal [lamotrigine] and No known allergies    Review of Systems   Constitutional: Negative.  Negative for chills and fever.   HENT: Negative.  Negative for congestion and sore throat.    Eyes: Negative.  Negative for visual disturbance.   Respiratory: Negative.  Negative for cough and shortness of breath.    Cardiovascular: Negative.  Negative for chest  pain.   Gastrointestinal: Negative.  Negative for abdominal pain, blood in stool, diarrhea, nausea and vomiting.   Genitourinary: Negative.  Negative for dysuria, frequency, hematuria and urgency.   Musculoskeletal: Negative.  Negative for joint swelling.   Skin: Negative.  Negative for rash.   Neurological: Negative.  Negative for dizziness, light-headedness and headaches.   All other systems reviewed and are negative.      Vitals:    04/13/17 2343 04/14/17 0015 04/14/17 0100 04/14/17 0302   BP: 108/66   95/58   Pulse: (!) 101   96   Resp: 16   18   Temp: 100.3 ??F (37.9 ??C)   (!) 100.9 ??F (38.3 ??C)   SpO2: 96% 98% 98% 98%   Weight: 104.3 kg (230 lb)      Height: 6\' 3"  (1.905 m)               Physical Exam   Constitutional: He is oriented to person, place, and time. He appears well-developed and well-nourished.   HENT:   Head: Normocephalic and atraumatic.   Eyes: Conjunctivae are normal. Pupils are equal, round, and reactive to light.   Neck: Normal range of motion. Neck supple.    Cardiovascular: Regular rhythm, normal heart sounds and intact distal pulses. Tachycardia present. Exam reveals no gallop.   No murmur heard.  Pulmonary/Chest: Effort normal and breath sounds normal. No respiratory distress. He has no wheezes. He has no rales. He exhibits no tenderness.   Abdominal: Soft. Bowel sounds are normal. He exhibits no distension. There is no tenderness. There is no rebound and no guarding.   Musculoskeletal: Normal range of motion. He exhibits no edema or tenderness.   Neurological: He is alert and oriented to person, place, and time.   Skin: Skin is warm and dry. No rash noted. No erythema.   Psychiatric: He has a normal mood and affect. His behavior is normal. Judgment and thought content normal.   Nursing note and vitals reviewed.       MDM  Number of Diagnoses or Management Options  Alcohol withdrawal syndrome with complication (HCC): established and worsening  Complicated urinary tract infection: established and worsening  Fever, unspecified fever cause: established and worsening  Diagnosis management comments: 56 y.o. male with fever and myalgia, possible for viral infection. Will work up for labs and reassess.       Plan:   Orders Placed This Encounter      INFLUENZA A & B AG (RAPID TEST)      CBC WITH AUTOMATED DIFF      METABOLIC PANEL, COMPREHENSIVE      sodium chloride 0.9 % bolus infusion 1,000 mL      IP CONSULT TO CHEMICAL DEPENDENCY  -----  12:20 AM  Documented by Katrinka Blazing Lux, acting as a scribe for Dr. Armida Sans, MD      PROVIDER ATTESTATION:  4:50 AM    The entirety of this note, signed by me, accurately reflects all works, treatments, procedures, and medical decision making performed by me, Armida Sans, MD.            Patient Progress  Patient progress: stable         Procedures      Diagnostic Studies:      Lab Data:  Labs Reviewed   CBC WITH AUTOMATED DIFF - Abnormal; Notable for the following components:       Result Value    MCHC 31.4 (*)  NEUTROPHILS 84 (*)     LYMPHOCYTES 8 (*)     ABS. NEUTROPHILS 8.3 (*)     ABS. LYMPHOCYTES 0.8 (*)     ABS. MONOCYTES 0.5 (*)     All other components within normal limits   METABOLIC PANEL, COMPREHENSIVE - Abnormal; Notable for the following components:    Glucose 111 (*)     Albumin 3.1 (*)     A-G Ratio 0.9 (*)     All other components within normal limits   INFLUENZA A & B AG (RAPID TEST)   CULTURE, BLOOD   CULTURE, BLOOD   LACTIC ACID         ED Course:   12:39 AM  Upon reviewing pt's ER, [facility].     2:34 AM  Pt is requesting medication for his fever and flu sx.      discussed case with Medical Director of [facility],   States pt will continue to have bed, however needs to be medically cleared. Once cleared, [facility]      4:51 AM  Pt continues to have fever. Labs reviewed. Not sepsis, however concern for fever from multiple cause  1. Viral or non identifible influenza strain given myalgias and fever  2. Complicated UTI, given positive UA with 200 UWBC seen, [facility]. Last cultures in 2013 did not grow bacteria    3. Alcohol withdrawal, last drink 2 days ago. Pt has documented hx of drinking 2- 6 packs and a pint per day no withdrawals. CIWA scale ordered.       Accepted to medicine by Dr. Barth KirksMichaels  Medicine Obs    1. Complicated urinary tract infection    2. Alcohol withdrawal syndrome with complication (HCC)    3. Fever, unspecified fever cause

## 2017-04-14 NOTE — ED Notes (Signed)
AUDIT Screen Score:  7      Document Brief Intervention (corresponds directly with the 5 A's, Ask, Advise, Assess, Assist, and Arrange):  56 y.o.  patient referred to [facility] via in basket due to screening score of 10 for suspected/identified drug overdose or drug use. Patient will not actively engage with [facility] at this time evident by his report of already being in treatment. [facility] will follow-up with patient and provide supportive/motivating reinforcements by encouraging reflection, discussing short term and long term health risks of using illicit drugs/consuming alcohol, identifying barriers of change and reaffirming willingness to help along with provide educational materials.          At- Risk Patients (Score 7-15 for women; 8-15 for men)  Discuss concern patient is drinking at unhealthy levels known to increase risk of alcohol-related health problems.    Is Patient ready to commit to change? Pt stated [facility]    If No:  ? Encourage reflection  ? Discuss short term and long term health risks of consuming alcohol  ? Barriers to change  ? Reaffirm willingness to help / Educational materials provided  If Yes:  ? Set goal  ? Plan  ? Educational materials provided    Harmful use or Dependence (Score 16 or greater)  ? Discuss short term and long term health risks of consuming alcohol  ? Recommendations  ? Negotiate drinking goal  ? Recommend addiction specialist/center  ? Arrange follow-up appointments.

## 2017-04-14 NOTE — H&P (Signed)
[facility]  HISTORY AND PHYSICAL      Name:[name], [name]  MR#: [id number]  DOB: 18-Dec-1960  ACCOUNT #: [id number]  ADMIT DATE: 04/14/2017    PRIMARY CARE PHYSICIAN:  Not identified.    CHIEF COMPLAINT:  Fever.    HISTORY OF PRESENT ILLNESS:  Patient is a 56 year old man with a past medical history of longstanding alcohol dependence, bipolar disorder and PTSD, who comes in complaining of fever.  [facility], where he was found to have a UTI and treated with antibiotics for infection.  He [facility], where he started having fever, chills and myalgias and arthralgias.  He was found to have a temperature of 102 at the program and [facility].  Patient states that he does feel tired and fatigued with myalgias and arthralgias.  He states that that started about 4-5 hours before coming into the hospital.  Denies any headaches, photophobia.  No neck or back stiffness.  Denies any shortness of breath.  Does have a dry cough, but no production.  No chest pain or chest tightness.  Denies any nausea, vomiting or diarrhea, and does have urinary symptoms that had started 3 days ago and this feels similar to his UTIs which he has been having for about 5 or 6 years.  He states he has been [facility], but he is not convinced that he wants to get the procedure, including a circumcision that the urologists have recommended.  He denies any dizziness, syncope.  No nausea or vomiting.  Denies any pain, just some dull discomfort in his suprapubic area as well as myalgias and arthralgias.  He does not have any sore throat or rhinorrhea.  He does feel that he also is going through some alcohol withdrawal, which he has had in the past, with his last drink being over 48 hours ago.    PAST MEDICAL HISTORY:  Significant for alcohol abuse, bipolar disorder, drug abuse and dependence.    PAST SURGICAL HISTORY:  None.     SOCIAL HISTORY:  He smokes 1/4 pack a day for the past 20 years for about a 5-pack-year history.  He drinks alcohol on a regular basis, last drink was 48 hours ago, and uses both cocaine and heroin.    FAMILY HISTORY:  Patient is not aware of chronic illnesses that run in his family.    ALLERGIES:  HE IS ALLERGIC TO LAMICTAL.    MEDICATIONS ON ADMISSION:  He takes thiamine 100 mg daily, folate daily, started on Keflex today, Librium started today, Benadryl 100 mg at night as well as Abilify 5 mg daily.    REVIEW OF SYSTEMS:  A 10-point review of system was used to assess the patient.  All pertinent positives in the HPI, all others are essentially negative.    PHYSICAL EXAMINATION:  GENERAL:  Well-developed, well-nourished man lying in bed comfortably, answering questions.  NEUROLOGIC:  Alert and oriented x3.  No focal deficit.  PSYCHIATRIC:  Flat affect with anxiety, repeating the same questions and answers in rapid succession over and over again, difficult to maintain on topic, but able to give good history and has intact judgment.  VITAL SIGNS:  T-max is 100.9, pulse is 101, respirations 16, blood pressure is 108/78.  O2 sat is 98% on room air.  He weighs 104 kilos, 230 pounds on a 6 foot 3 inch frame.  HEENT:  Head:  Normocephalic, atraumatic.  Eyes:  Extraocular muscles are intact.  Pupils are equal, reactive.  Mouth:  Without any lesion.  Dry mucous membranes.  NECK:  Supple.  No JVD.  LUNGS:  Clear bilaterally.  No rales, rhonchi or wheezes.  CARDIOVASCULAR:  Regular rate and rhythm without a rub, murmur or gallop.  ABDOMEN:  Soft, normal bowel sounds.  No guarding or rebound.  GENITOURINARY:  No CVA tenderness or suprapubic fullness.  Some discomfort in his suprapubic area to deep palpation, but no guarding.  EXTREMITIES:  No edema, clubbing or cyanosis.  SKIN:  Intact without a rash or breakdown.    LABORATORY EVALUATION:  Shows white count of 9.9, H and H of 13.2 and  42.0, platelets of 160.  Sodium is 137, potassium 3.9, chloride 100, CO2 of 29, BUN is 18, creatinine 1.25, glucose of 109, calcium is 8.5, albumin is 3.1, lactic acid 0.9.  Influenza A and B negative.  Blood cultures were done.    ASSESSMENT AND PLAN:  1.  This patient comes in with a known urinary tract infection.  Will continue on antibiotics with Cipro.  Blood cultures were done and will follow and change antibiotic regimen accordingly.  2.  Although the patient has been negative for influenza, appears almost to have a viral-like syndrome, will continue to monitor and treat with Tylenol  3.  Renal insufficiency.  This is most likely due to dehydration.  Will give IV fluids.  Shows other signs of volume depletion with dry mucous membranes and low blood pressure.  4.  Bipolar disease.  Will continue on his medication.  5.  History of alcohol withdrawal.  The patient states that he is feeling slightly tremulous.  Will continue on Librium and add CIWA monitoring.    6.  Venous thromboembolism prophylaxis.  Due to the patient's risk of falls, will use sequential compression devices and avoid anticoagulation at this time.    All of the above conditions were present on admission.      Britt BologneseMIRIAM Laquenta Whitsell, MD       MM/DN  D: 04/14/2017 07:39     T: 04/14/2017 10:04  JOB #: [id number]

## 2017-04-14 NOTE — Other (Signed)
Reason for Admission: Complicated UTI (urinary tract infection)  Alcohol withdrawal (HCC)  Fever  UTI (urinary tract infection)      Problem List:   Hospital Problems  Never Reviewed          Codes Class Noted POA    Alcohol withdrawal (HCC) ICD-10-CM: X91.478F10.239  ICD-9-CM: 291.81  04/14/2017 Unknown        Fever ICD-10-CM: R50.9  ICD-9-CM: 780.60  04/14/2017 Unknown        Complicated UTI (urinary tract infection) ICD-10-CM: N39.0  ICD-9-CM: 599.0  04/14/2017 Unknown        UTI (urinary tract infection) ICD-10-CM: N39.0  ICD-9-CM: 599.0  04/14/2017 Unknown              Background:  Past Medical History:   Past Medical History:   Diagnosis Date   ??? Bipolar 1 disorder (HCC)    ??? Bipolar affective (HCC)    ??? Drug abuse (HCC)    ??? ETOH abuse    ??? PTSD (post-traumatic stress disorder)                    Telemetry: n/a                                  Pain Assessment:  Pain Intensity 1: 0 (04/14/17 1132)           Patient Stated Pain Goal: 0            Time of last Intervention: n/a  Effectiveness of intervention: n/a   Other Actions to relieve pain: n/a             Summary of Shift :    Pt' sBP on the low side SBP on low 100 and DBP  Low 60's    With episode fever Dr.Moon made aware  Tylenol given,will continue to monitor,on IV Cipro,on .            Bedside and Verbal shift change report given to Maureen RalphsVivian (Cabin crewoncoming nurse) by Barbette HairMaureen Dumaplin, RN   (offgoing nurse). Report included the following information SBAR, Kardex, ED Summary, Procedure Summary, Intake/Output, MAR and Recent Results.

## 2017-04-14 NOTE — Progress Notes (Signed)
Readmission Risk Assessment     Name: [name] [name]    Date:04/14/2017   Room #:       Column 1 Column 2 Column 3    ???Do/Have/Are you??? Low Risk Moderate Risk High Risk       Row 1   Living Situation have a live-in caregiver to assist you  OR  live alone with community support  []                                     live alone with limited community support  []                                   live alone with no community supportOR(Hotel)  have live-in family who aren't actively involved in your care  [x]                                     Row 2 Physician have a  Primary Care  doctor []                                   N/A []                                   NOT have a Primary Care doctor (4pts) []                                     Row 3 Insurance insured []                                   N/A []                                   uninsured (4pts) [x]                                     Row 4 Activities of Daily Living (ADL/IADL) (ADls): groom, feed, dress, and get to the bathroom independently  (IADls): shop, budget, keep house, travel, manage medications independently  [x]                                     require temporary or regular assistance with ADls or IADls  []                                     require considerable assistance with ADls or IADls  []                                     Row 5 Disease Mgmt feel capable to manage your chronic illness and follow  your  doctor's orders independently  []                                   N/A []                                   require considerable assistance managing your chronic illness and other medical needs (clinically complex) []                                     Row 6 Meds Mgmt feel capable to manage your medications independently []                                   require some assistance managing your medication []                                   require considerable assistance managing your medications []                                      Row 7 Polypharmacy N/A []              take> 7 different medications at home []              N/A []              Row 8 Cognitive Ability feel capable to make your own  health care decisions [x]                                   sometimes have difficulty making your own health care decisions []                                   feel incapable to make your own health care decisions (cognitive impairment) []                                     Row 9 CHF N/A []                                   N/A []                                   have Congestive Heart Failure (4pts)  []                                     Row 10 COPD N/A []                                   N/A []   have COPD (4pts) '[]'$                                     Row 11 Diabetes N/A '[]'$                                   N/A '[]'$                                   have Diabetes (4pts)  '[]'$                                     Row 12 HIV/AIDS N/A '[]'$                                   N/A '[]'$                                   have HIVor AIDS (4pts) '[]'$                                     Row 13 Fall Risk N/A '[]'$                                   N/A '[]'$                                   have a history of falls (4pts) '[]'$                                     Row 14 Wound Care N/A '[]'$                                   N/A '[]'$                                   wounds or pressure ulcers (4pts) '[]'$                                     Row 15 Alcohol N/A '[]'$                                   N/A '[]'$                                   had> 4 alcoholic drinks/day during last 12 months (4pts)  '[x]'$   Row 16 Drugs N/A []              N/A []              used marijuana, cocaine, heroin, or anything else to get "high" during last 12 months (4pts) []              Row 17 Mental Health N/A []                                   have a history of mental illness []                                   have a  history of mental illness (4pts) [x]                                     Row 18 Readmission? N/A []                                   N/A []                                   been hospitalized in the last 30 days (4pts)  []                                     Row 19 ACO/MSSP N/A []                                   N/A []                                   enrolled in the Medicare Shared Savings Program(4pts) []                                      Totals:15 2     (Low Risk)                                 0           (Moderate Risk)                            13                (?4 =High Risk)                          Assessment completed by: Shari ProwsLinda T Dara Camargo, 04/14/2017

## 2017-04-14 NOTE — Other (Signed)
04/14/2017      GLOS    Discharge Date: 04/16/17    RRAT score :7    Plan of care: Pt maintained on ivf/iv cipro. On ciwa protocol for alcohol withdrawal, last ciwa is 1, documented at 0900. Pt to continue on psych meds  And [facility], will be discharged to the shelter if no destination provided.

## 2017-04-14 NOTE — ED Notes (Signed)
PT remains very anxious, concerned that he will lose his rehab bed. Reassurance provided.

## 2017-04-14 NOTE — ED Notes (Signed)
PT provided with sandwich and juice.

## 2017-04-14 NOTE — Progress Notes (Signed)
Pt's MEWS Score 4 @ 1243 noon febrile with temp of  102.8 F noted to Dr.Moon  To give tylenol  Since blood cultures already done in ED.

## 2017-04-14 NOTE — ED Notes (Signed)
REport to [employer]Maureen RN.    Verbal shift change report given to Marisue HumbleMaureen (Cabin crewoncoming nurse) by Corrie DandyMary (offgoing nurse). Report included the following information SBAR, Kardex, ED Summary and MAR.

## 2017-04-15 LAB — CBC WITH AUTOMATED DIFF
ABS. BASOPHILS: 0.1 10*3/uL (ref 0.0–0.2)
ABS. EOSINOPHILS: 0.4 10*3/uL (ref 0.0–0.7)
ABS. LYMPHOCYTES: 1 10*3/uL — ABNORMAL LOW (ref 1.2–3.4)
ABS. MONOCYTES: 0.3 10*3/uL — ABNORMAL LOW (ref 1.1–3.2)
ABS. NEUTROPHILS: 2.7 10*3/uL (ref 1.4–6.5)
BASOPHILS: 1 % (ref 0–2)
EOSINOPHILS: 8 % — ABNORMAL HIGH (ref 0–5)
HCT: 43.9 % (ref 36.8–45.2)
HGB: 13.7 g/dL (ref 12.8–15.0)
IMMATURE GRANULOCYTES: 0 % (ref 0.0–5.0)
LYMPHOCYTES: 23 % (ref 16–40)
MCH: 29.5 PG (ref 27–31)
MCHC: 31.2 g/dL — ABNORMAL LOW (ref 32–36)
MCV: 94.6 FL (ref 81–99)
MONOCYTES: 6 % (ref 0–12)
MPV: 9.7 FL (ref 7.4–10.4)
NEUTROPHILS: 62 % (ref 40–70)
PLATELET: 143 10*3/uL (ref 140–450)
RBC: 4.64 M/uL (ref 4.0–5.2)
RDW: 13.5 % (ref 11.5–14.5)
WBC: 4.4 10*3/uL — ABNORMAL LOW (ref 4.8–10.8)

## 2017-04-15 LAB — PROTHROMBIN TIME + INR
INR: 1.2 (ref 0.9–1.2)
Prothrombin time: 12.3 s — ABNORMAL HIGH (ref 9.8–12.0)

## 2017-04-15 LAB — METABOLIC PANEL, COMPREHENSIVE
A-G Ratio: 0.8 — ABNORMAL LOW (ref 1.0–3.1)
ALT (SGPT): 28 U/L (ref 12–78)
AST (SGOT): 24 U/L (ref 15–37)
Albumin: 2.9 g/dL — ABNORMAL LOW (ref 3.4–5.0)
Alk. phosphatase: 83 U/L (ref 46–116)
Anion gap: 14 mmol/L (ref 10–17)
BUN/Creatinine ratio: 8 (ref 6–20)
BUN: 7 MG/DL (ref 7–18)
Bilirubin, total: 0.7 MG/DL (ref 0.2–1.0)
CO2: 25 mmol/L (ref 21–32)
Calcium: 8.4 MG/DL — ABNORMAL LOW (ref 8.5–10.1)
Chloride: 105 mmol/L (ref 98–107)
Creatinine: 0.89 MG/DL (ref 0.6–1.3)
GFR est AA: >60 mL/min/{1.73_m2} (ref >60)
GFR est non-AA: >60 mL/min/{1.73_m2} (ref >60)
Globulin: 3.7 g/dL
Glucose: 98 mg/dL (ref 74–106)
Potassium: 4 mmol/L (ref 3.5–5.1)
Protein, total: 6.6 g/dL (ref 6.4–8.2)
Sodium: 140 mmol/L (ref 136–145)

## 2017-04-15 MED ORDER — DIAZEPAM 5 MG/ML SYRINGE
5 mg/mL | Freq: Four times a day (QID) | INTRAMUSCULAR | Status: DC | PRN
Start: 2017-04-15 — End: 2017-04-16

## 2017-04-15 MED FILL — IBUPROFEN 400 MG TAB: 400 mg | ORAL | Qty: 1

## 2017-04-15 MED FILL — CIPROFLOXACIN IN D5W 400 MG/200 ML IV PIGGY BACK: 400 mg/200 mL | INTRAVENOUS | Qty: 200

## 2017-04-15 MED FILL — DOCUSATE SODIUM 100 MG CAP: 100 mg | ORAL | Qty: 1

## 2017-04-15 MED FILL — HEPARIN (PORCINE) 5,000 UNIT/ML IJ SOLN: 5000 unit/mL | INTRAMUSCULAR | Qty: 1

## 2017-04-15 MED FILL — ARIPIPRAZOLE 5 MG TAB: 5 mg | ORAL | Qty: 1

## 2017-04-15 MED FILL — SODIUM CHLORIDE 0.9 % IV: INTRAVENOUS | Qty: 1000

## 2017-04-15 MED FILL — FOLIC ACID 1 MG TAB: 1 mg | ORAL | Qty: 1

## 2017-04-15 MED FILL — THIAMINE 100 MG/ML INJECTION: 100 mg/mL | INTRAMUSCULAR | Qty: 2

## 2017-04-15 NOTE — Progress Notes (Signed)
NUTRITION initial assessment    PLAN:   1. Continue Regular Diet  2. Monitor daily intakes  3. RD to follow per protocol     GOALS:   1. Meal intakes 75-100% average in next 5 days    SUBJECTIVE/OBJECTIVE:   11/21:  Patient seen today due to MST Risk Screen Triggered for unsure weight loss.  Upon interview patient states that he has not noticed any change in his weight recently and states normal weight to be ~235#.  Current weight 242# is within obese range. Patient has good appetite with 75-100% of meals eaten on Regular Diet.      Dx: UTI; Fever; Alcohol Withdrawl  PMH: Bipolar Disorder  Diet: Regular  Medications: Folvite daily;  Thiamine Daily; Colace BID; NS @ 19350ml/hr   [x]  Reviewed    Allergies:   [x]  Reviewed     LBM: 11/21   Physical Assessment: patient appears well nourished with no skin breakdown        Labs:    Lab Results   Component Value Date/Time    Sodium 140 04/15/2017 04:00 AM    Potassium 4.0 04/15/2017 04:00 AM    Chloride 105 04/15/2017 04:00 AM    CO2 25 04/15/2017 04:00 AM    Anion gap 14 04/15/2017 04:00 AM    Glucose 98 04/15/2017 04:00 AM    Glucose 109 (H) 11/30/2010 06:10 AM    BUN 7 04/15/2017 04:00 AM    Creatinine 0.89 04/15/2017 04:00 AM    Calcium 8.4 (L) 04/15/2017 04:00 AM    Albumin 2.9 (L) 04/15/2017 04:00 AM         Intake/Output Summary (Last 24 hours) at 04/15/2017 1823  Last data filed at 04/15/2017 1804  Gross per 24 hour   Intake 1860 ml   Output 1500 ml   Net 360 ml     Anthropometrics:  ,  , BMI (calculated): 30.4  Last 3 Recorded Weights in this Encounter    04/13/17 2343 04/14/17 0913   Weight: 104.3 kg (230 lb) 110.2 kg (242 lb 15.2 oz)      Ht Readings from Last 1 Encounters:   04/13/17 6\' 3"  (1.905 m)       Estimated Nutrition Needs: [x]  MSJ []  [address] 2003b []  Other ________    (2022 (MSJ x 1 for obesity)), Protein (g): (88 - 110 (0.8-1g/kg)) Fluid (ml): (2022 (251ml/kcal))  Based on:   [x]   Actual BW    []   IBW   []  Adjusted BW       Wt Status:  []  Underweight (<18.5) []  Normal Weight (18.5 - 24.9) []  Overweight (25 - 29.9) [x]  Mild Obesity (30 - 34.9)  []  Moderate Obesity (35 - 39.9) []  Morbid Obesity (40+)       ASSESSMENT:     Nutrition Diagnoses:   No nutrition diagnosis at this time.     Nutrition Interventions:  Goal: Meal intakes average 75-100% in next 4 days  Recommended Diet/Supplements: Continue current diet    Education needs:   [x]   No educational needs identified    []   The following education needs were addressed: _____________      [x]   No cultural, religious, or ethnic dietary needs identified    []   Cultural, religious and ethnic food preferences identified and addressed      []   Participated in care plan, discharge planning/Interdisciplinary rounds. Discharge plan: _Recommend general healthy diet at discharge    Nutrition Risk:  []  High  [  x] Moderate []   Low    Concha PyoKelsey M Garmon, RD, LDN  Ext: [phone]

## 2017-04-15 NOTE — Progress Notes (Signed)
Problem: Falls - Risk of  Goal: *Absence of Falls  Document Schmid Fall Risk and appropriate interventions in the flowsheet.  Outcome: Progressing Towards Goal  Fall Risk Interventions:

## 2017-04-15 NOTE — Progress Notes (Signed)
Pt wants the SW/CM to call [facility] at # [phone]. Copy of his info on his chart.

## 2017-04-15 NOTE — Progress Notes (Signed)
CIWA score is 1. Continued on IV fluids & IV Cipro. Ibuprofen for arthritic pain.                  Reason for Admission: Complicated UTI (urinary tract infection)  Alcohol withdrawal (HCC)  Fever  UTI (urinary tract infection)      Problem List:   Hospital Problems  Never Reviewed          Codes Class Noted POA    Alcohol withdrawal (HCC) ICD-10-CM: Z61.096F10.239  ICD-9-CM: 291.81  04/14/2017 Unknown        Fever ICD-10-CM: R50.9  ICD-9-CM: 780.60  04/14/2017 Unknown        Complicated UTI (urinary tract infection) ICD-10-CM: N39.0  ICD-9-CM: 599.0  04/14/2017 Unknown        UTI (urinary tract infection) ICD-10-CM: N39.0  ICD-9-CM: 599.0  04/14/2017 Unknown              Background:  Past Medical History:   Past Medical History:   Diagnosis Date   ??? Bipolar 1 disorder (HCC)    ??? Bipolar affective (HCC)    ??? Drug abuse (HCC)    ??? ETOH abuse    ??? PTSD (post-traumatic stress disorder)                    Telemetry: medical                                  Pain Assessment:  Pain Intensity 1: 5 (04/15/17 1715)  Pain Location 1: Generalized   Pain Intervention(s) 1: Medication (see MAR)    Patient Stated Pain Goal: 0            Time of last Intervention: 35 minutes  Effectiveness of intervention: some relief   Other Actions to relieve pain: Ibuprofen             Summary of Shift :    Bedside and Verbal shift change report given to Roxanne RN (oncoming nurse) by Mariea ClontsAngela M Sandil, RN   (offgoing nurse). Report included the following information SBAR, Kardex and MAR.

## 2017-04-15 NOTE — Progress Notes (Signed)
[facility] do have [facility] bed on hold for this pt. He may return when medically stable even if it is tomorrow (Thanksgiving holiday)  Prior to discharge, need to call [phone] for their psychiatrist to confirm pt is medically stable. [address] then be [address]ft [address]  [address].

## 2017-04-15 NOTE — Progress Notes (Signed)
Care Transition Note- Plan of care was discuss by Dr. Lyman BishopLawrence Social Workers and Case Managers . Mr. [name] [name] is not schedule to discharge today.  Follow up appointment will not be scheduled at this time. Patient discharge status is currently to be determined. Writer will attempt to assist the patient with an appointment at a later date.  Gwinda MaineLinda Ambrie Carte- Care Transition Liaison.

## 2017-04-15 NOTE — Other (Signed)
Reason for Admission: Complicated UTI (urinary tract infection)  Alcohol withdrawal (HCC)  Fever  UTI (urinary tract infection)      Problem List:   Hospital Problems  Never Reviewed          Codes Class Noted POA    Alcohol withdrawal (HCC) ICD-10-CM: Q46.962F10.239  ICD-9-CM: 291.81  04/14/2017 Unknown        Fever ICD-10-CM: R50.9  ICD-9-CM: 780.60  04/14/2017 Unknown        Complicated UTI (urinary tract infection) ICD-10-CM: N39.0  ICD-9-CM: 599.0  04/14/2017 Unknown        UTI (urinary tract infection) ICD-10-CM: N39.0  ICD-9-CM: 599.0  04/14/2017 Unknown              Background:  Past Medical History:   Past Medical History:   Diagnosis Date   ??? Bipolar 1 disorder (HCC)    ??? Bipolar affective (HCC)    ??? Drug abuse (HCC)    ??? ETOH abuse    ??? PTSD (post-traumatic stress disorder)                    Telemetry: normal sinus rhythm                                  Pain Assessment:  Pain Intensity 1: 0 (04/14/17 2049)           Patient Stated Pain Goal: 0            Time of last Intervention: 1.5 hour  Effectiveness of intervention: complete resolution   Other Actions to relieve pain: comfort             Summary of Shift :    Bedside and Verbal shift change report given to Marylene LandAngela (Cabin crewoncoming nurse) by vivian (offgoing nurse). Report included the following information SBAR, Kardex and MAR.

## 2017-04-15 NOTE — Progress Notes (Signed)
Hospitalist Progress Note           Daily Progress Note: 04/15/2017      Subjective/issues/events:   Patient admitted for Complicated UTI (urinary tract infection)  Alcohol withdrawal (HCC)  Fever  UTI (urinary tract infection)  Nursing notes reviewed.    Denies dysuria, hematuria or frequency.  No myalgia, cough, rhinorrhea, wheezing, sob or v/d.        Review of Systems:   All other systems negative on 10 systems reviewed except for as stated in subjective.    Objective:     Medications reviewed:  Current Facility-Administered Medications   Medication Dose Route Frequency   ??? docusate sodium (COLACE) capsule 100 mg  100 mg Oral BID   ??? thiamine (B-1) injection 100 mg  100 mg IntraMUSCular DAILY   ??? ARIPiprazole (ABILIFY) tablet 5 mg  5 mg Oral DAILY   ??? 0.9% sodium chloride infusion  150 mL/hr IntraVENous CONTINUOUS   ??? ciprofloxacin (CIPRO) 400 mg IVPB (premix)  400 mg IntraVENous Q12H   ??? heparin (porcine) injection 5,000 Units  5,000 Units SubCUTAneous Q8H   ??? folic acid (FOLVITE) tablet 1 mg  1 mg Oral DAILY         Physical Exam:     Visit Vitals  BP 137/83 (BP 1 Location: Left arm, BP Patient Position: Sitting)   Pulse 80   Temp 97.9 ??F (36.6 ??C)   Resp 20   Ht 6\' 3"  (1.905 m)   Wt 110.2 kg (242 lb 15.2 oz)   SpO2 98%   BMI 30.37 kg/m??      O2 Device: Room air    Temp (24hrs), Avg:99.8 ??F (37.7 ??C), Min:97.9 ??F (36.6 ??C), Max:102.8 ??F (39.3 ??C)    No intake/output data recorded.   11/19 1901 - 11/21 0700  In: 4465 [P.O.:880; I.V.:3585]  Out: 1600 [Urine:1600]    General:   HEENT:  No acute distress. Appears stated age.   NC/AT. Neck supple. Trachea midline. PEERLA, EOMI, no icterus.   Lungs:    Clear to auscultation bilaterally. No wheezing/rales/rhonchi. No accessory muscle use. No tachypnea.   Chest wall:   No tenderness or deformity.   Heart:  Regular rate and rhythm, S1, S2 normal, no murmurs, clicks, rubs or gallops. No JVD. No carotid bruits.     Abdomen:   Soft, non-tender/non-distended. Bowel sounds normal. No masses,  No organomegaly. No guarding or rigidity   Extremities: Extremities normal, atraumatic, no cyanosis or edema.   Pulses: 2+ and symmetric all extremities.   Skin: Skin color, texture, turgor normal. No rashes or lesions   Neurologic:     No focal motor or sensory deficits appreciated.        Lines, Drains, and Airways:    Peripheral IV 04/13/17 Left Antecubital (Active)   Site Assessment Clean, dry, & intact 04/14/2017  8:49 PM   Phlebitis Assessment 0 04/14/2017  8:49 PM   Infiltration Assessment 0 04/14/2017  8:49 PM   Dressing Status Clean, dry, & intact 04/14/2017  8:49 PM   Dressing Type Tape;Transparent 04/14/2017  9:15 AM   Hub Color/Line Status Pink 04/14/2017  9:15 AM   Action Taken Dressing reinforced 04/14/2017  9:15 AM   Alcohol Cap Used Yes 04/14/2017  9:15 AM       Data Review:       Recent Days:  Recent Labs     04/15/17  0400 04/14/17  0045   WBC 4.4* 9.9   HGB 13.7  13.2   HCT 43.9 42.0   PLT 143 160     Recent Labs     04/15/17  0400 04/14/17  0045   NA 140 137   K 4.0 3.9   CL 105 100   CO2 25 29   GLU 98 111*   BUN 7 18   CREA 0.89 1.25   CA 8.4* 8.5   ALB 2.9* 3.1*   SGOT 24 22   ALT 28 30   INR 1.2  --      No results for input(s): PH, PCO2, PO2, HCO3, FIO2 in the last 72 hours.    Xr Chest Port    Result Date: 04/14/2017  EXAM:  XR CHEST PORT dated 04/14/2017 10:36 AM INDICATION:  fever COMPARISON: None. FINDINGS: A portable AP semierect radiograph of the chest was obtained at 0955 hours. The lungs are moderately well expanded and clear of infiltrate. No evidence of failure or effusion. Heart size normal. No adenopathy. Old healed left rib fracture deformities.     IMPRESSION:  No evidence of active disease.       Problem List:  Hospital Problems  Never Reviewed          Codes Class Noted POA    Alcohol withdrawal (HCC) ICD-10-CM: Z61.096F10.239  ICD-9-CM: 291.81  04/14/2017 Unknown        Fever ICD-10-CM: R50.9   ICD-9-CM: 780.60  04/14/2017 Unknown        Complicated UTI (urinary tract infection) ICD-10-CM: N39.0  ICD-9-CM: 599.0  04/14/2017 Unknown        UTI (urinary tract infection) ICD-10-CM: N39.0  ICD-9-CM: 599.0  04/14/2017 Unknown              All of the above diagnoses were present on admission.    Plan     1. Uti:  contiue cipro iv.  uc and bc are pending.    2. Myalgia:  Rapid flu negative  3. etoh abuse:  Ciwa.  Valium prn.  Thiamine and folic acid  4. Disposition:  Pt to return to inpatient etoh program when stable.         Care Plan discussed with: patient in detail. Expressed understanding and is in agreement.           Disposition:   Expected length of stay: 2 day(s)      Total time spent with patient: 30 minutes.    Judene CompanionMichael T Vandella Ord, MD  04/15/2017  11:05 AM

## 2017-04-15 NOTE — Progress Notes (Signed)
04/15/2017      GLOS    Discharge Date     1 day    RRAT score     7    Plan of care     Patient had temp of 101 overnight. On IV Cipro for UTI. Urine culture done; no growth x 24 hrs. [facility]. CM Chip Boer(Vicki) called and confirmed he can return on 11/22 if their psychiatrist verifies he is medically stable. Contact # and instructions provided to PRN colleagues. SW informed patient it was verified he can return. SW contacted [facility] re: patient's health insurance; was informed [place].    Blanche EastLeslie Dunham, LCSW-C

## 2017-04-16 LAB — CULTURE, URINE
Culture result:: NO GROWTH
Culture: NO GROWTH

## 2017-04-16 LAB — CBC WITH AUTOMATED DIFF
ABS. BASOPHILS: 0.1 10*3/uL (ref 0.0–0.2)
ABS. EOSINOPHILS: 0.8 10*3/uL — ABNORMAL HIGH (ref 0.0–0.7)
ABS. LYMPHOCYTES: 0.9 10*3/uL — ABNORMAL LOW (ref 1.2–3.4)
ABS. MONOCYTES: 0.4 10*3/uL — ABNORMAL LOW (ref 1.1–3.2)
ABS. NEUTROPHILS: 1.4 10*3/uL (ref 1.4–6.5)
BASOPHILS: 1 % (ref 0–2)
EOSINOPHILS: 21 % — ABNORMAL HIGH (ref 0–5)
HCT: 43.3 % (ref 36.8–45.2)
HGB: 13.6 g/dL (ref 12.8–15.0)
IMMATURE GRANULOCYTES: 0 % (ref 0.0–5.0)
LYMPHOCYTES: 25 % (ref 16–40)
MCH: 29.8 PG (ref 27–31)
MCHC: 31.4 g/dL — ABNORMAL LOW (ref 32–36)
MCV: 94.7 FL (ref 81–99)
MONOCYTES: 11 % (ref 0–12)
MPV: 10.2 FL (ref 7.4–10.4)
NEUTROPHILS: 42 % (ref 40–70)
PLATELET: 148 10*3/uL (ref 140–450)
RBC: 4.57 M/uL (ref 4.0–5.2)
RDW: 13.4 % (ref 11.5–14.5)
WBC: 3.5 10*3/uL — ABNORMAL LOW (ref 4.8–10.8)

## 2017-04-16 LAB — METABOLIC PANEL, COMPREHENSIVE
A-G Ratio: 0.8 — ABNORMAL LOW (ref 1.0–3.1)
ALT (SGPT): 30 U/L (ref 12–78)
AST (SGOT): 24 U/L (ref 15–37)
Albumin: 2.6 g/dL — ABNORMAL LOW (ref 3.4–5.0)
Alk. phosphatase: 89 U/L (ref 46–116)
Anion gap: 14 mmol/L (ref 10–17)
BUN/Creatinine ratio: 11 (ref 6.0–20.0)
BUN: 10 MG/DL (ref 7–18)
Bilirubin, total: 0.5 MG/DL (ref 0.2–1.0)
CO2: 25 mmol/L (ref 21–32)
Calcium: 8 MG/DL — ABNORMAL LOW (ref 8.5–10.1)
Chloride: 106 mmol/L (ref 98–107)
Creatinine: 0.93 MG/DL (ref 0.6–1.3)
GFR est AA: >60 mL/min/{1.73_m2} (ref >60)
GFR est non-AA: >60 mL/min/{1.73_m2} (ref >60)
Globulin: 3.3 g/dL
Glucose: 137 mg/dL — ABNORMAL HIGH (ref 74–106)
Potassium: 4 mmol/L (ref 3.5–5.1)
Protein, total: 5.9 g/dL — ABNORMAL LOW (ref 6.4–8.2)
Sodium: 141 mmol/L (ref 136–145)

## 2017-04-16 MED ORDER — CLONAZEPAM 1 MG TAB
1 mg | ORAL_TABLET | Freq: Three times a day (TID) | ORAL | 0 refills | Status: AC
Start: 2017-04-16 — End: ?

## 2017-04-16 MED ORDER — CIPROFLOXACIN 500 MG TAB
500 mg | ORAL_TABLET | Freq: Two times a day (BID) | ORAL | 0 refills | Status: DC
Start: 2017-04-16 — End: 2017-04-16

## 2017-04-16 MED ORDER — HYDROXYZINE PAMOATE 50 MG CAP
50 mg | ORAL_CAPSULE | Freq: Every evening | ORAL | 0 refills | Status: AC | PRN
Start: 2017-04-16 — End: 2017-04-30

## 2017-04-16 MED ORDER — CIPROFLOXACIN 500 MG TAB
500 mg | ORAL_TABLET | Freq: Two times a day (BID) | ORAL | 0 refills | Status: AC
Start: 2017-04-16 — End: 2017-04-21

## 2017-04-16 MED ORDER — HYDROXYZINE PAMOATE 25 MG CAP
25 mg | Freq: Every evening | ORAL | Status: DC | PRN
Start: 2017-04-16 — End: 2017-04-16
  Administered 2017-04-16: 07:00:00 via ORAL

## 2017-04-16 MED FILL — CIPROFLOXACIN IN D5W 400 MG/200 ML IV PIGGY BACK: 400 mg/200 mL | INTRAVENOUS | Qty: 200

## 2017-04-16 MED FILL — DOCUSATE SODIUM 100 MG CAP: 100 mg | ORAL | Qty: 1

## 2017-04-16 MED FILL — SODIUM CHLORIDE 0.9 % IV: INTRAVENOUS | Qty: 1000

## 2017-04-16 MED FILL — HYDROXYZINE PAMOATE 25 MG CAP: 25 mg | ORAL | Qty: 4

## 2017-04-16 MED FILL — FOLIC ACID 1 MG TAB: 1 mg | ORAL | Qty: 1

## 2017-04-16 MED FILL — ARIPIPRAZOLE 5 MG TAB: 5 mg | ORAL | Qty: 1

## 2017-04-16 MED FILL — IBUPROFEN 400 MG TAB: 400 mg | ORAL | Qty: 1

## 2017-04-16 MED FILL — HEPARIN (PORCINE) 5,000 UNIT/ML IJ SOLN: 5000 unit/mL | INTRAMUSCULAR | Qty: 1

## 2017-04-16 MED FILL — THIAMINE 100 MG/ML INJECTION: 100 mg/mL | INTRAMUSCULAR | Qty: 2

## 2017-04-16 NOTE — Progress Notes (Signed)
Problem: Falls - Risk of  Goal: *Absence of Falls  Document Schmid Fall Risk and appropriate interventions in the flowsheet.  Outcome: Progressing Towards Goal  Fall Risk Interventions:     Bed alarm on , pt ambulates to the bathroom, has steady gait, but staff as standby while pt in the bathroom, for safety.

## 2017-04-16 NOTE — Discharge Summary (Signed)
[facility] Hospitalist Discharge Summary:    Name:  [name] [name]      DOB:  04-14-1961        MRN:  [id number]      Date/Time: 04/16/2017  10:30 AM  Admit Date: 04/14/2017 12:08 AM   Discharge Date:  04/16/2017      ALLERGIES: Lamictal [Lamotrigine], No Known Allergies    CODE STATUS: FULL    Primary Care Provider:  None    Patient's Primary Contact Number: Extended Emergency Contact Information  Primary Emergency Contact: [name] [name]  Address: [address] [place] [address] [place] Phone: [phone]  Relation: Mother       Discharge Diagnoses:    Hospital Problems as of 04/16/2017 Never Reviewed          Codes Class Noted - Resolved POA    Alcohol withdrawal ([place]) ICD-10-CM: B28.413  ICD-9-CM: 291.81  04/14/2017 - Present Unknown        Fever ICD-10-CM: R50.9  ICD-9-CM: 780.60  04/14/2017 - Present Unknown        Complicated UTI (urinary tract infection) ICD-10-CM: N39.0  ICD-9-CM: 599.0  04/14/2017 - Present Unknown        UTI (urinary tract infection) ICD-10-CM: N39.0  ICD-9-CM: 599.0  04/14/2017 - Present Unknown                         Hospital Course:    [name] [name] is a 56 y.o. male who presented with hx of longstanding alcohol dependence, bipolar disorder and PTSD, who comes in complaining of fever with diffuse body aches. Recently diagnosed with [facility], discharged on oral cephalexin.  Urine analysis clean here, blood culutres, urine culture remaiend negative, rapid flu negative. Likely fever from viral infection/flu. Will be discharged on oral ciprofloxacin to complete course for UTI. All medications for psychiatry illness resumed. Need to f/u PCP in 3-5 days.            Patient has received maximum hospital benefit.  Hospital course explained and questions answered to patients satisfaction.      Consultants:  None          Procedures: No admission procedures for hospital encounter.    Past Medical History:  Past Medical History:    Diagnosis Date   ??? Bipolar 1 disorder ([place])    ??? Bipolar affective ([place])    ??? Drug abuse ([place])    ??? ETOH abuse    ??? PTSD (post-traumatic stress disorder)         Vitals:     Last 24hrs vital signs reviewed since prior progress note. Most recent are:    Visit Vitals  BP 109/74 (BP 1 Location: Right arm, BP Patient Position: Head of bed elevated (Comment degrees))   Pulse 75   Temp 98.9 ??F (37.2 ??C)   Resp 20   Ht 6' 3"  (1.905 m)   Wt 110.2 kg (242 lb 15.2 oz)   SpO2 99%   BMI 30.37 kg/m??        Body mass index is 30.37 kg/m??.    GENERAL: In no acute distress   HEENT: EOEMI, PERLA, Normocephalic, Atraumatic, Anicteric no oral lesions, neck supple, moist oral mucosa, No JVD, No carotid bruit, Trachea is central, no cervical , axillary or inguinal Lymphadenopathy  LUNGS:  Clear To Auscultation bilaterally - no wheezes, rales,or rhonchi  CARDIAC: +S1 and S2  normal,  Regular Rate and Rhythm.  ABDOMEN:  Soft, non-tender, non-distended, positive bowel sounds, no guarding, rigidity or rebound tenderness, no hepatosplenomegaly, does not have PEG Tube  EXTREMITIES:  No edema, clubbing, or cyanosis. Pulses intact. Full range of motion.   SKIN: Warm, moist.  Normal skin turgor.  No obvious rash or lesions. no ulcers  NEUROLOGIC:  No obvious focal deficits. Sensation and strength grossly intact.   AAOX 3, no motor or sensory deficit, Cranial Nerves II-XII intact  Exam otherwise negative       _____________________________________________________________________      Lab Review:     Recent Results (from the past 24 hour(s))   CBC WITH AUTOMATED DIFF    Collection Time: 04/16/17  4:00 AM   Result Value Ref Range    WBC 3.5 (L) 4.8 - 10.8 K/uL    RBC 4.57 4.0 - 5.2 M/uL    HGB 13.6 12.8 - 15.0 g/dL    HCT 43.3 36.8 - 45.2 %    MCV 94.7 81 - 99 FL    MCH 29.8 27 - 31 PG    MCHC 31.4 (L) 32 - 36 g/dL    RDW 13.4 11.5 - 14.5 %    PLATELET 148 140 - 450 K/uL    MPV 10.2 7.4 - 10.4 FL    NEUTROPHILS 42 40 - 70 %     LYMPHOCYTES 25 16 - 40 %    MONOCYTES 11 0 - 12 %    EOSINOPHILS 21 (H) 0 - 5 %    BASOPHILS 1 0 - 2 %    ABS. NEUTROPHILS 1.4 1.4 - 6.5 K/UL    ABS. LYMPHOCYTES 0.9 (L) 1.2 - 3.4 K/UL    ABS. MONOCYTES 0.4 (L) 1.1 - 3.2 K/UL    ABS. EOSINOPHILS 0.8 (H) 0.0 - 0.7 K/UL    ABS. BASOPHILS 0.1 0.0 - 0.2 K/UL    DF AUTOMATED      IMMATURE GRANULOCYTES 0 0.0 - 5.0 %   METABOLIC PANEL, COMPREHENSIVE    Collection Time: 04/16/17  4:00 AM   Result Value Ref Range    Sodium 141 136 - 145 mmol/L    Potassium 4.0 3.5 - 5.1 mmol/L    Chloride 106 98 - 107 mmol/L    CO2 25 21 - 32 mmol/L    Anion gap 14 10 - 17 mmol/L    Glucose 137 (H) 74 - 106 mg/dL    BUN 10 7 - 18 MG/DL    Creatinine 0.93 0.6 - 1.3 MG/DL    BUN/Creatinine ratio 11 6.0 - 20.0      GFR est AA >60 >60 ml/min/1.65m    GFR est non-AA >60 >60 ml/min/1.723m   Calcium 8.0 (L) 8.5 - 10.1 MG/DL    Bilirubin, total 0.5 0.2 - 1.0 MG/DL    ALT (SGPT) 30 12 - 78 U/L    AST (SGOT) 24 15 - 37 U/L    Alk. phosphatase 89 46 - 116 U/L    Protein, total 5.9 (L) 6.4 - 8.2 g/dL    Albumin 2.6 (L) 3.4 - 5.0 g/dL    Globulin 3.3 g/dL    A-G Ratio 0.8 (L) 1.0 - 3.1            Imaging:     Xr Chest Port    Result Date: 04/14/2017  EXAM:  XR CHEST PORT dated 04/14/2017 10:36 AM INDICATION:  fever COMPARISON: None. FINDINGS: A portable AP semierect radiograph of  the chest was obtained at 0955 hours. The lungs are moderately well expanded and clear of infiltrate. No evidence of failure or effusion. Heart size normal. No adenopathy. Old healed left rib fracture deformities.     IMPRESSION:  No evidence of active disease.         Discharge Medication List:    Current Discharge Medication List      START taking these medications    Details   ciprofloxacin HCl (CIPRO) 500 mg tablet Take 1 Tab by mouth two (2) times a day for 5 days.  Qty: 10 Tab, Refills: 0         CONTINUE these medications which have NOT CHANGED    Details    clonazePAM (KLONOPIN) 1 mg tablet Take 1 Tab by mouth three (3) times daily.  Qty: 30 Tab, Refills: 2      HYDROXYZINE PAMOATE (VISTARIL PO) Take  by mouth.        ZOLPIDEM TARTRATE (AMBIEN PO) Take  by mouth.                 Discharge Status:    Condition:  good and improved    Prognosis: good        DISCHARGE PLAN AND RECOMMENDATIONS:    ?? Discharge to Rehab  Follow-up Information     Follow up With Specialties Details Why Contact Info    None    None (395) Patient stated that they have no PCP        ??       >30 ([id number])  35  minutes spent in the process of discharge    Elisabeth Most, MD  04/16/2017

## 2017-04-16 NOTE — Progress Notes (Signed)
[facility] staff Burna MortimerWanda with another male person came to the floor to fetch the pt. She requested copies of lab results & discharge summary to justify [facility] the [facility]. Writer also reviewed with her the 3 paper prescriptions inside the discharge packet, namely Cipro, Klonazepam, & Vistaryl. Pt discharged from the unit, ambulatory with steady gait, in good condition, accomapanied by [facility] staff Wanda .

## 2017-04-16 NOTE — Progress Notes (Signed)
Waiting for [facility] transport to pick the pt up.

## 2017-04-16 NOTE — Progress Notes (Signed)
Pt worried that [facility] may [facility] if he will not be discharged today. RN Burn[facility]Wanda [facility] & asking when pt will be discharged . Made her speak to case manager Angelisa. Paged Dr Waynetta SandyMoon as well.

## 2017-04-16 NOTE — Progress Notes (Signed)
Pt verbalized understanding of his discharge instructions. 3 paper prescriptions provided by Dr Waynetta SandyMoon ( Cipro, Klonopin, & Vistaril). Per CM Angelisa, she already contacted & [facility] Burna MortimerWanda, their own trasnport coming up in a few minutes to fetch the pt.

## 2017-04-16 NOTE — Progress Notes (Incomplete)
Case Management/Social Work Disposition Note    Name:  Garry HeaterVincent Gaby      DOB:  11/01/1960        MRN:  [id number]      Discharge Date:  04/16/2017      Discharged to: {[facility] BAL PLACEMENT:22310}    Care giver Name and contact information : [facility]     [facility]    Follow-up appointment with: [facility]    Discharge information provided to patient/family: {yes/ no default yes:19425::"yes"}    Seward MethAngelesa D Blackwell, MSW  04/16/2017

## 2017-04-16 NOTE — Other (Signed)
Reason for Admission: Complicated UTI (urinary tract infection)  Alcohol withdrawal (HCC)  Fever  UTI (urinary tract infection)      Problem List:   Hospital Problems  Never Reviewed          Codes Class Noted POA    Alcohol withdrawal (HCC) ICD-10-CM: R60.454F10.239  ICD-9-CM: 291.81  04/14/2017 Unknown        Fever ICD-10-CM: R50.9  ICD-9-CM: 780.60  04/14/2017 Unknown        Complicated UTI (urinary tract infection) ICD-10-CM: N39.0  ICD-9-CM: 599.0  04/14/2017 Unknown        UTI (urinary tract infection) ICD-10-CM: N39.0  ICD-9-CM: 599.0  04/14/2017 Unknown              Background:  Past Medical History:   Past Medical History:   Diagnosis Date   ??? Bipolar 1 disorder (HCC)    ??? Bipolar affective (HCC)    ??? Drug abuse (HCC)    ??? ETOH abuse    ??? PTSD (post-traumatic stress disorder)                    Telemetry: normal sinus rhythm                                  Pain Assessment:  Pain Intensity 1: 3 (04/15/17 2112)  Pain Location 1: Generalized   Pain Intervention(s) 1: Medication (see MAR)    Patient Stated Pain Goal: 0            Time of last Intervention: n/a  Effectiveness of intervention: n/a   Other Actions to relieve pain: rest             Summary of Shift :   Bedside and Verbal shift change report given to Marylene LandAngela, Charity fundraiserN  (oncoming nurse) by  Leandro Reasoneroxanne, RN (offgoing nurse). Report included the following information SBAR and Kardex.     New medication Vistaril added for sleep upon request.

## 2017-04-16 NOTE — Discharge Summary (Signed)
Discharge Summary by Elisabeth Most, MD at 04/16/17 1030                Author: Elisabeth Most, MD  Service: Internal Medicine  Author Type: Physician       Filed: 04/16/17 1034  Date of Service: 04/16/17 1030  Status: Signed          Editor: Elisabeth Most, MD (Physician)                                     [facility] Hospitalist Discharge Summary:         Name:   [name] [name]       DOB:  Apr 11, 1961         MRN:  [id number]       Date/Time: 04/16/2017  10:30 AM   Admit Date: 04/14/2017 12:08 AM    Discharge Date:   04/16/2017         ALLERGIES: Lamictal [Lamotrigine], No Known Allergies      CODE STATUS: FULL      Primary Care Provider:   None      Patient's Primary Contact Number: Extended Emergency Contact Information   Primary Emergency Contact: Fede,Dovie   Address: [address] [place] [address] [place] Phone: [phone]   Relation: Mother          Discharge Diagnoses:         Hospital Problems as of  04/16/2017  Never Reviewed                         Codes  Class  Noted - Resolved  POA              Alcohol withdrawal ([place])  ICD-10-CM: C37.628   ICD-9-CM: 291.81    04/14/2017 - Present  Unknown                        Fever  ICD-10-CM: R50.9   ICD-9-CM: 780.60    04/14/2017 - Present  Unknown                        Complicated UTI (urinary tract infection)  ICD-10-CM: N39.0   ICD-9-CM: 599.0    04/14/2017 - Present  Unknown                        UTI (urinary tract infection)  ICD-10-CM: N39.0   ICD-9-CM: 599.0    04/14/2017 - Present  Unknown                                            Hospital Course:      [name] [name] is a 56 y.o.  male who presented with hx of longstanding alcohol dependence, bipolar disorder and PTSD, who comes in complaining of fever with diffuse body  aches. Recently diagnosed with UTI at [facility], discharged on oral cephalexin.  Urine analysis clean here, blood culutres, urine culture remaiend negative, rapid flu  negative. Likely fever from viral infection/flu. Will be discharged on oral ciprofloxacin  to complete course for UTI. All medications for psychiatry illness  resumed. Need to f/u PCP in 3-5 days.                 Patient has received maximum hospital benefit.  Hospital course explained and questions answered to patients satisfaction.         Consultants:   None               Procedures: No admission procedures for hospital encounter.      Past Medical History:     Past Medical History:        Diagnosis  Date         ?  Bipolar 1 disorder ([place])       ?  Bipolar affective ([place])       ?  Drug abuse ([place])       ?  ETOH abuse           ?  PTSD (post-traumatic stress disorder)                Vitals:        Last 24hrs vital signs reviewed since prior  progress note. Most recent are:      Visit Vitals      BP  109/74 (BP 1 Location: Right arm, BP Patient Position: Head of bed elevated (Comment degrees))     Pulse  75     Temp  98.9 ??F (37.2 ??C)     Resp  20     Ht  '6\' 3"'  (1.905 m)     Wt  110.2 kg (242 lb 15.2 oz)     SpO2  99%        BMI  30.37 kg/m??            Body mass index is 30.37 kg/m??.      GENERAL: In no acute distress    HEENT: EOEMI, PERLA, Normocephalic, Atraumatic, Anicteric no oral lesions, neck supple, moist oral mucosa, No JVD, No carotid bruit, Trachea  is central, no cervical , axillary or inguinal Lymphadenopathy   LUNGS:  Clear To Auscultation bilaterally - no wheezes, rales,or rhonchi   CARDIAC: +S1 and S2 normal,  Regular Rate and Rhythm.   ABDOMEN:  Soft, non-tender, non-distended, positive bowel sounds, no guarding, rigidity or rebound tenderness, no hepatosplenomegaly,  does not have PEG Tube   EXTREMITIES:  No edema, clubbing, or cyanosis. Pulses intact. Full range of motion.    SKIN: Warm, moist.  Normal skin turgor.  No obvious rash or lesions. no ulcers   NEUROLOGIC:  No obvious focal deficits. Sensation and strength grossly intact.    AAOX 3, no motor or sensory deficit, Cranial Nerves II-XII  intact   Exam otherwise negative          _____________________________________________________________________           Lab Review:          Recent Results (from the past 24 hour(s))     CBC WITH AUTOMATED DIFF          Collection Time: 04/16/17  4:00 AM         Result  Value  Ref Range            WBC  3.5 (L)  4.8 - 10.8 K/uL       RBC  4.57  4.0 - 5.2 M/uL       HGB  13.6  12.8 - 15.0 g/dL       HCT  43.3  36.8 - 45.2 %  MCV  94.7  81 - 99 FL       MCH  29.8  27 - 31 PG       MCHC  31.4 (L)  32 - 36 g/dL       RDW  13.4  11.5 - 14.5 %       PLATELET  148  140 - 450 K/uL       MPV  10.2  7.4 - 10.4 FL       NEUTROPHILS  42  40 - 70 %       LYMPHOCYTES  25  16 - 40 %       MONOCYTES  11  0 - 12 %       EOSINOPHILS  21 (H)  0 - 5 %       BASOPHILS  1  0 - 2 %       ABS. NEUTROPHILS  1.4  1.4 - 6.5 K/UL            ABS. LYMPHOCYTES  0.9 (L)  1.2 - 3.4 K/UL            ABS. MONOCYTES  0.4 (L)  1.1 - 3.2 K/UL       ABS. EOSINOPHILS  0.8 (H)  0.0 - 0.7 K/UL       ABS. BASOPHILS  0.1  0.0 - 0.2 K/UL       DF  AUTOMATED          IMMATURE GRANULOCYTES  0  0.0 - 5.0 %       METABOLIC PANEL, COMPREHENSIVE          Collection Time: 04/16/17  4:00 AM         Result  Value  Ref Range            Sodium  141  136 - 145 mmol/L       Potassium  4.0  3.5 - 5.1 mmol/L       Chloride  106  98 - 107 mmol/L       CO2  25  21 - 32 mmol/L       Anion gap  14  10 - 17 mmol/L       Glucose  137 (H)  74 - 106 mg/dL       BUN  10  7 - 18 MG/DL       Creatinine  0.93  0.6 - 1.3 MG/DL       BUN/Creatinine ratio  11  6.0 - 20.0         GFR est AA  >60  >60 ml/min/1.57m       GFR est non-AA  >60  >60 ml/min/1.777m      Calcium  8.0 (L)  8.5 - 10.1 MG/DL       Bilirubin, total  0.5  0.2 - 1.0 MG/DL       ALT (SGPT)  30  12 - 78 U/L       AST (SGOT)  24  15 - 37 U/L       Alk. phosphatase  89  46 - 116 U/L       Protein, total  5.9 (L)  6.4 - 8.2 g/dL       Albumin  2.6 (L)  3.4 - 5.0 g/dL       Globulin  3.3  g/dL            A-G Ratio  0.8  (L)  1.0 - 3.1                   Imaging:        Xr Chest Port      Result Date: 04/14/2017   EXAM:  XR CHEST PORT dated 04/14/2017 10:36 AM INDICATION:  fever COMPARISON: None. FINDINGS: A portable AP semierect radiograph of the chest was obtained at 0955 hours. The lungs are moderately well expanded and clear of infiltrate. No evidence of failure  or effusion. Heart size normal. No adenopathy. Old healed left rib fracture deformities.       IMPRESSION:  No evidence of active disease.             Discharge Medication List:        Current Discharge Medication List              START taking these medications          Details        ciprofloxacin HCl (CIPRO) 500 mg tablet  Take 1 Tab by mouth two (2) times a day for 5 days.   Qty: 10 Tab, Refills:  0                     CONTINUE these medications which have NOT CHANGED          Details        clonazePAM (KLONOPIN) 1 mg tablet  Take 1 Tab by mouth three (3) times daily.   Qty: 30 Tab, Refills:  2               HYDROXYZINE PAMOATE (VISTARIL PO)  Take  by mouth.                 ZOLPIDEM TARTRATE (AMBIEN PO)  Take  by mouth.                              Discharge Status:      Condition:  good and improved      Prognosis: good           DISCHARGE PLAN AND RECOMMENDATIONS:      ??  Discharge to Rehab     Follow-up Information               Follow up With  Specialties  Details  Why  Contact Info              None        None ([phone]) Patient stated that they have no PCP             ??            >30 ([id number])  35  minutes spent in the process of discharge      Elisabeth Most, MD   04/16/2017

## 2017-04-19 LAB — CULTURE, BLOOD
Culture result:: NO GROWTH
Culture result:: NO GROWTH

## 2017-11-13 ENCOUNTER — Other Ambulatory Visit: Payer: Self-pay

## 2017-11-13 ENCOUNTER — Encounter (HOSPITAL_COMMUNITY): Payer: Self-pay

## 2017-11-13 ENCOUNTER — Emergency Department (HOSPITAL_COMMUNITY)
Admission: EM | Admit: 2017-11-13 | Discharge: 2017-11-13 | Disposition: A | Discharge status: Home / Self Care | Payer: Medicaid Other | Attending: Emergency Medicine | Admitting: Emergency Medicine

## 2017-11-13 DIAGNOSIS — Z87891 Personal history of nicotine dependence: Secondary | ICD-10-CM | POA: Diagnosis not present

## 2017-11-13 DIAGNOSIS — K0889 Other specified disorders of teeth and supporting structures: Secondary | ICD-10-CM | POA: Diagnosis present

## 2017-11-13 DIAGNOSIS — Z79899 Other long term (current) drug therapy: Secondary | ICD-10-CM | POA: Insufficient documentation

## 2017-11-13 MED ORDER — PENICILLIN V POTASSIUM 500 MG PO TABS: 500.0000 mg | ORAL_TABLET | Freq: Four times a day (QID) | ORAL | 0 refills | Status: AC

## 2017-11-13 MED ORDER — MELOXICAM 15 MG PO TABS: 15.0000 mg | ORAL_TABLET | Freq: Every day | ORAL | 0 refills | Status: AC

## 2017-11-13 MED ORDER — LIDOCAINE VISCOUS HCL 2 % MT SOLN: 15.0000 mL | OROMUCOSAL | 0 refills | Status: AC | PRN

## 2017-11-13 NOTE — Discharge Instructions (Signed)
Please read and follow all provided instructions.  Your diagnoses today include:  1. Pain, dental     The exam and treatment you received today has been provided on an emergency basis only. This is not a substitute for complete medical or dental care. This problem will not resolve on its own without the care of a dentist.  Tests performed today include: Vital signs. See below for your results today.   Medications prescribed:   Take any prescribed medications only as directed. Use ibuprofen or naproxen for pain. Use the viscous lidocaine for mouth pain. Swish with the lidocaine and spit it out. Do not swallow it.  Please take all of your antibiotics until finished!   You may develop abdominal discomfort or diarrhea from the antibiotic.  You may help offset this with probiotics which you can buy or get in yogurt. Do not eat or take the probiotics until 2 hours after your antibiotic. Do not take your medicine if develop an itchy rash, swelling in your mouth or lips, or difficulty breathing.   Home care instructions:  Follow any educational materials contained in this packet.  Follow-up instructions: Please follow-up with your dentist for further evaluation of your symptoms.   Dental Assistance: See handout for dental referrals  Return instructions:  Please return to the Emergency Department if you experience worsening symptoms. Please return if you develop a fever, you develop more swelling in your face or neck, you have trouble breathing or swallowing food. Please return if you have any other emergent concerns.  Additional Information:  Your vital signs today were: BP (!) 142/88 (BP Location: Right Arm)    Pulse 87    Temp 98.4 F (36.9 C) (Oral)    Resp 16    Ht 6\' 3"  (1.905 m)    Wt 104.3 kg (230 lb)    SpO2 100%    BMI 28.75 kg/m  If your blood pressure (BP) was elevated above 135/85 this visit, please have this repeated by your doctor within one month. --------------

## 2017-11-13 NOTE — ED Notes (Signed)
Patient verbalized understanding of discharge instructions and denies any further needs or questions at this time. VS stable. Patient ambulatory with steady gait.  

## 2017-11-13 NOTE — ED Provider Notes (Signed)
[facility] EMERGENCY DEPARTMENT Provider Note   CSN: [id number] Arrival date & time: 11/13/17  1435     History   Chief Complaint Chief Complaint  Patient presents with  . Dental Pain    HPI [name] is a 57 y.o. male who presents emergency department today for right lower dental pain x2 days.  Patient reports that he has poor dentition.  He reports that part of his tooth is broken off and he thinks he may begin to be having an infection.  He reports he has not followed up with a dentist in several years.  His sister made a dental appointment for him next week.  He is unsure who this is with.  He is requesting dental resources to see if he can make an appointment sooner.  He is taken Aleve for his symptoms with mild relief. Denies fever, chills, voice change, inability to control secretions, nausea/vomiting, facial swelling, dysphagia, odynophagia, drainage or trauma  HPI  Past Medical History:  Diagnosis Date  . Arthritis   . Balanitis   . Bipolar 1 disorder ([facility])   . Depression   . PTSD (post-traumatic stress disorder)   . Schizophrenia [facility])     Patient Active Problem List   Diagnosis Date Noted  . Noncompliance 04/30/2016  . Schizoaffective disorder, bipolar type ([facility])   . Balanitis 03/26/2015  . Anxiety   . Substance induced mood disorder ([facility]) 12/08/2013  . Cocaine use disorder, moderate, dependence ([facility]) 06/29/2012  . Alcohol use disorder, moderate, dependence ([facility]) 06/29/2012  . Bipolar I disorder, most recent episode depressed, severe without psychotic features ([facility]) 04/22/2012    Class: Acute    History reviewed. No pertinent surgical history.      Home Medications    Prior to Admission medications   Medication Sig Start Date End Date Taking? Authorizing Provider  fluPHENAZine (PROLIXIN) 10 MG tablet Take 1 tablet (10 mg total) by mouth 2 (two) times daily before a meal. 05/08/16   Pucilowska, Jolanta B, MD  fluvoxaMINE (LUVOX)  50 MG tablet Take 3 tablets (150 mg total) by mouth at bedtime. 05/08/16   Pucilowska, Braulio Conte B, MD  lamoTRIgine (LAMICTAL) 25 MG tablet Take 2 tablets (50 mg total) by mouth at bedtime. Take 2 tab by mouth at night for 7 days, 4 tabs for 2 weeks, 8 tabs or 200 mg after. 05/08/16   Pucilowska, Jolanta B, MD  OLANZapine (ZYPREXA) 15 MG tablet Take 1 tablet (15 mg total) by mouth at bedtime. 05/08/16   Pucilowska, Braulio Conte B, MD  sulfamethoxazole-trimethoprim (BACTRIM DS,SEPTRA DS) 800-160 MG tablet Take 1 tablet by mouth every 12 (twelve) hours. 05/08/16   Shari Prows, MD    Family History History reviewed. No pertinent family history.  Social History Social History   Tobacco Use  . Smoking status: Former Smoker    Packs/day: 0.00    Types: Cigarettes    Last attempt to quit: 05/02/2014    Years since quitting: 3.5  . Smokeless tobacco: Never Used  Substance Use Topics  . Alcohol use: Not Currently    Frequency: Never  . Drug use: Never    Types: Marijuana, Cocaine    Comment: hx of cocaine     Allergies   Lithium; Hydrocodone-acetaminophen; and Trazodone   Review of Systems Review of Systems  All other systems reviewed and are negative.    Physical Exam Updated Vital Signs BP (!) 142/88 (BP Location: Right Arm)   Pulse 87  Temp 98.4 F (36.9 C) (Oral)   Resp 16   Ht 6\' 3"  (1.905 m)   Wt 104.3 kg (230 lb)   SpO2 100%   BMI 28.75 kg/m   Physical Exam  Constitutional: He appears well-developed and well-nourished.  HENT:  Head: Normocephalic and atraumatic.  Right Ear: External ear normal.  Left Ear: External ear normal.  The patient has normal phonation and is in control of secretions. No stridor.  Midline uvula without edema. Soft palate rises symmetrically.  No tonsillar erythema or exudates. No PTA. Tongue protrusion is normal. No trismus. No creptius on neck palpation. Patient has very poor dentition.  He has multiple missing teeth as well as  several cavities noted.  There is noted broken teeth of the right lower jaw.  No floor edema. no gingival erythema or fluctuance noted. Mucus membranes moist. No facial swelling or neck swelling.   Eyes: Conjunctivae are normal. Right eye exhibits no discharge. Left eye exhibits no discharge. No scleral icterus.  Neck:  No nuchal rigidity or meningismus  Pulmonary/Chest: Effort normal. No respiratory distress.  Neurological: He is alert.  Skin: No pallor.  Psychiatric: He has a normal mood and affect.  Nursing note and vitals reviewed.    ED Treatments / Results  Labs (all labs ordered are listed, but only abnormal results are displayed) Labs Reviewed - No data to display  EKG None  Radiology No results found.  Procedures Procedures (including critical care time)  Medications Ordered in ED Medications - No data to display   Initial Impression / Assessment and Plan / ED Course  I have reviewed the triage vital signs and the nursing notes.  Pertinent labs & imaging results that were available during my care of the patient were reviewed by me and considered in my medical decision making (see chart for details).     57 y.o. male Patient with toothache.  No gross abscess.  Exam unconcerning for Ludwig's angina or spread of infection.  Will treat with penicillin and anti-inflammatories medicine.  Urged patient to follow-up with dentist. Specific return precautions discussed. Time was given for all questions to be answered. The patient verbalized understanding and agreement with plan. The patient appears safe for discharge home.  Final Clinical Impressions(s) / ED Diagnoses   Final diagnoses:  Pain, dental    ED Discharge Orders        Ordered    penicillin v potassium (VEETID) 500 MG tablet  4 times daily     11/13/17 1520    meloxicam (MOBIC) 15 MG tablet  Daily     11/13/17 1520    lidocaine (XYLOCAINE) 2 % solution  As needed     11/13/17 1520       Princella PellegriniMaczis,  Jadan Hinojos M, PA-C 11/13/17 1642    Donnetta Hutchingook, Brian, MD 11/16/17 2050

## 2017-11-13 NOTE — ED Triage Notes (Signed)
Pt endorses right lower dental pain x 1 month from broken teeth. Does not have a dentist. VSS.

## 2017-12-29 ENCOUNTER — Emergency Department
Admission: EM | Admit: 2017-12-29 | Discharge: 2017-12-29 | Disposition: A | Discharge status: Other Acute Inpatient Hospital | Payer: Medicaid HMO | Attending: Emergency Medicine | Admitting: Emergency Medicine

## 2017-12-29 ENCOUNTER — Telehealth (HOSPITAL_BASED_OUTPATIENT_CLINIC_OR_DEPARTMENT_OTHER): Payer: Self-pay

## 2017-12-29 ENCOUNTER — Inpatient Hospital Stay
Admission: AD | Admit: 2017-12-29 | Discharge: 2018-01-05 | DRG: 753 | Disposition: A | Discharge status: Home / Self Care | Payer: Medicaid HMO | Source: Other Acute Inpatient Hospital | Attending: Psychiatry | Admitting: Psychiatry

## 2017-12-29 DIAGNOSIS — F319 Bipolar disorder, unspecified: Secondary | ICD-10-CM | POA: Insufficient documentation

## 2017-12-29 DIAGNOSIS — N3000 Acute cystitis without hematuria: Secondary | ICD-10-CM | POA: Diagnosis present

## 2017-12-29 DIAGNOSIS — F4310 Post-traumatic stress disorder, unspecified: Secondary | ICD-10-CM

## 2017-12-29 DIAGNOSIS — F1490 Cocaine use, unspecified, uncomplicated: Secondary | ICD-10-CM

## 2017-12-29 DIAGNOSIS — F1410 Cocaine abuse, uncomplicated: Secondary | ICD-10-CM | POA: Diagnosis present

## 2017-12-29 DIAGNOSIS — Z79899 Other long term (current) drug therapy: Secondary | ICD-10-CM | POA: Insufficient documentation

## 2017-12-29 DIAGNOSIS — M1990 Unspecified osteoarthritis, unspecified site: Secondary | ICD-10-CM | POA: Diagnosis present

## 2017-12-29 DIAGNOSIS — F315 Bipolar disorder, current episode depressed, severe, with psychotic features: Principal | ICD-10-CM | POA: Diagnosis present

## 2017-12-29 DIAGNOSIS — N481 Balanitis: Secondary | ICD-10-CM | POA: Diagnosis not present

## 2017-12-29 DIAGNOSIS — G4700 Insomnia, unspecified: Secondary | ICD-10-CM | POA: Diagnosis present

## 2017-12-29 DIAGNOSIS — F1010 Alcohol abuse, uncomplicated: Secondary | ICD-10-CM | POA: Diagnosis present

## 2017-12-29 DIAGNOSIS — F3160 Bipolar disorder, current episode mixed, unspecified: Secondary | ICD-10-CM

## 2017-12-29 DIAGNOSIS — R45851 Suicidal ideations: Secondary | ICD-10-CM | POA: Diagnosis present

## 2017-12-29 DIAGNOSIS — Z9114 Patient's other noncompliance with medication regimen: Secondary | ICD-10-CM

## 2017-12-29 HISTORY — DX: Post-traumatic stress disorder, unspecified: F43.10

## 2017-12-29 HISTORY — DX: Unspecified osteoarthritis, unspecified site: M19.90

## 2017-12-29 HISTORY — DX: Bipolar disorder, unspecified: F31.9

## 2017-12-29 LAB — RAPID DRUG SCREEN, URINE
Barbiturate Screen, UR: NEGATIVE
Benzodiazepine Screen, UR: NEGATIVE
Cannabinoid Screen, UR: NEGATIVE
Cocaine, UR: POSITIVE — AB
Opiate Screen, UR: NEGATIVE
PCP Screen, UR: NEGATIVE
Urine Amphetamine Screen: NEGATIVE

## 2017-12-29 LAB — CBC AND DIFFERENTIAL
Absolute NRBC: 0 10*3/uL (ref 0.00–0.00)
Basophils Absolute Automated: 0.06 10*3/uL (ref 0.00–0.08)
Basophils Automated: 1.2 %
Eosinophils Absolute Automated: 0.7 10*3/uL — ABNORMAL HIGH (ref 0.00–0.44)
Eosinophils Automated: 13.5 %
Hematocrit: 43.7 % (ref 37.6–49.6)
Hgb: 13.8 g/dL (ref 12.5–17.1)
Immature Granulocytes Absolute: 0.01 10*3/uL (ref 0.00–0.07)
Immature Granulocytes: 0.2 %
Lymphocytes Absolute Automated: 1.23 10*3/uL (ref 0.42–3.22)
Lymphocytes Automated: 23.7 %
MCH: 28.5 pg (ref 25.1–33.5)
MCHC: 31.6 g/dL (ref 31.5–35.8)
MCV: 90.1 fL (ref 78.0–96.0)
MPV: 9.5 fL (ref 8.9–12.5)
Monocytes Absolute Automated: 0.46 10*3/uL (ref 0.21–0.85)
Monocytes: 8.9 %
Neutrophils Absolute: 2.73 10*3/uL (ref 1.10–6.33)
Neutrophils: 52.5 %
Nucleated RBC: 0 /100 WBC (ref 0.0–0.0)
Platelets: 208 10*3/uL (ref 142–346)
RBC: 4.85 10*6/uL (ref 4.20–5.90)
RDW: 12 % (ref 11–15)
WBC: 5.19 10*3/uL (ref 3.10–9.50)

## 2017-12-29 LAB — URINALYSIS, REFLEX TO MICROSCOPIC EXAM IF INDICATED
Bilirubin, UA: NEGATIVE
Glucose, UA: NEGATIVE
Nitrite, UA: NEGATIVE
Protein, UR: 30 — AB
Specific Gravity UA: 1.026 (ref 1.001–1.035)
Urine pH: 6 (ref 5.0–8.0)
Urobilinogen, UA: 4 mg/dL — AB

## 2017-12-29 LAB — COMPREHENSIVE METABOLIC PANEL
ALT: 53 U/L (ref 0–55)
AST (SGOT): 70 U/L — ABNORMAL HIGH (ref 5–34)
Albumin/Globulin Ratio: 1.2 (ref 0.9–2.2)
Albumin: 4.3 g/dL (ref 3.5–5.0)
Alkaline Phosphatase: 115 U/L — ABNORMAL HIGH (ref 38–106)
Anion Gap: 11 (ref 5.0–15.0)
BUN: 19 mg/dL (ref 9.0–28.0)
Bilirubin, Total: 0.8 mg/dL (ref 0.2–1.2)
CO2: 26 mEq/L (ref 22–29)
Calcium: 9.2 mg/dL (ref 8.5–10.5)
Chloride: 102 mEq/L (ref 100–111)
Creatinine: 0.9 mg/dL (ref 0.7–1.3)
Globulin: 3.5 g/dL (ref 2.0–3.6)
Glucose: 103 mg/dL — ABNORMAL HIGH (ref 70–100)
Potassium: 3.7 mEq/L (ref 3.5–5.1)
Protein, Total: 7.8 g/dL (ref 6.0–8.3)
Sodium: 139 mEq/L (ref 136–145)

## 2017-12-29 LAB — HEMOLYSIS INDEX: Hemolysis Index: 13 (ref 0–18)

## 2017-12-29 LAB — ETHANOL: Alcohol: NOT DETECTED mg/dL

## 2017-12-29 LAB — GFR: EGFR: >60

## 2017-12-29 LAB — SALICYLATE LEVEL: Salicylate Level: <5 mg/dL — ABNORMAL LOW (ref 15.0–30.0)

## 2017-12-29 LAB — ACETAMINOPHEN LEVEL: Acetaminophen Level: <7 ug/mL — ABNORMAL LOW (ref 10–30)

## 2017-12-29 MED ORDER — QUETIAPINE FUMARATE 25 MG PO TABS
50.00 mg | ORAL_TABLET | Freq: Every evening | ORAL | Status: DC | PRN
Start: 2017-12-29 — End: 2018-01-01
  Administered 2017-12-29: 50 mg via ORAL
  Filled 2017-12-29: qty 2

## 2017-12-29 MED ORDER — CETIRIZINE HCL 10 MG PO TABS
10.00 mg | ORAL_TABLET | Freq: Every day | ORAL | Status: DC | PRN
Start: 2017-12-29 — End: 2018-01-05

## 2017-12-29 MED ORDER — TAB-A-VITE/BETA CAROTENE PO TABS
1.00 | ORAL_TABLET | Freq: Every day | ORAL | Status: DC
Start: 2017-12-30 — End: 2018-01-05
  Administered 2017-12-30 – 2018-01-05 (×7): 1 via ORAL
  Filled 2017-12-29 (×7): qty 1

## 2017-12-29 MED ORDER — LORAZEPAM 1 MG PO TABS
2.00 mg | ORAL_TABLET | Freq: Once | ORAL | Status: AC
Start: 2017-12-29 — End: 2017-12-29
  Administered 2017-12-29: 16:00:00 2 mg via ORAL
  Filled 2017-12-29: qty 2

## 2017-12-29 MED ORDER — NITROFURANTOIN MONOHYD MACRO 100 MG PO CAPS
100.00 mg | ORAL_CAPSULE | Freq: Two times a day (BID) | ORAL | 0 refills | Status: DC
Start: 2017-12-29 — End: 2018-01-05

## 2017-12-29 MED ORDER — FOLIC ACID 1 MG PO TABS
1.00 mg | ORAL_TABLET | Freq: Every day | ORAL | Status: DC
Start: 2017-12-30 — End: 2018-01-05
  Administered 2017-12-30 – 2018-01-05 (×7): 1 mg via ORAL
  Filled 2017-12-29 (×7): qty 1

## 2017-12-29 MED ORDER — THIAMINE (VITAMIN B1) 100 MG PO TABS (WRAP)
100.00 mg | ORAL_TABLET | Freq: Every day | ORAL | Status: DC
Start: 2017-12-30 — End: 2018-01-05
  Administered 2017-12-30 – 2018-01-05 (×7): 100 mg via ORAL
  Filled 2017-12-29 (×7): qty 1

## 2017-12-29 MED ORDER — NITROFURANTOIN MONOHYD MACRO 100 MG PO CAPS
100.00 mg | ORAL_CAPSULE | Freq: Once | ORAL | Status: AC
Start: 2017-12-29 — End: 2017-12-29
  Administered 2017-12-29: 21:00:00 100 mg via ORAL
  Filled 2017-12-29: qty 1

## 2017-12-29 MED ORDER — GABAPENTIN 300 MG PO CAPS
300.00 mg | ORAL_CAPSULE | Freq: Four times a day (QID) | ORAL | Status: DC
Start: 2017-12-29 — End: 2018-01-04
  Administered 2017-12-29 – 2018-01-02 (×17): 300 mg via ORAL
  Filled 2017-12-29 (×21): qty 1

## 2017-12-29 MED ORDER — LORAZEPAM 1 MG PO TABS
1.00 mg | ORAL_TABLET | ORAL | Status: DC | PRN
Start: 2017-12-29 — End: 2018-01-05
  Administered 2017-12-29 – 2017-12-30 (×4): 1 mg via ORAL
  Filled 2017-12-29 (×4): qty 1

## 2017-12-29 MED ORDER — QUETIAPINE FUMARATE 100 MG PO TABS
100.00 mg | ORAL_TABLET | Freq: Every evening | ORAL | Status: DC | PRN
Start: 2017-12-29 — End: 2018-01-01

## 2017-12-29 MED ORDER — BISACODYL 5 MG PO TBEC
5.00 mg | DELAYED_RELEASE_TABLET | Freq: Every day | ORAL | Status: DC | PRN
Start: 2017-12-29 — End: 2018-01-05

## 2017-12-29 MED ORDER — MAGNESIUM HYDROXIDE 400 MG/5ML PO SUSP
30.00 mL | Freq: Every day | ORAL | Status: DC | PRN
Start: 2017-12-29 — End: 2018-01-05

## 2017-12-29 MED ORDER — FAMOTIDINE 20 MG PO TABS
20.00 mg | ORAL_TABLET | Freq: Two times a day (BID) | ORAL | Status: DC | PRN
Start: 2017-12-29 — End: 2018-01-05

## 2017-12-29 MED ORDER — ACETAMINOPHEN 325 MG PO TABS
650.0000 mg | ORAL_TABLET | ORAL | Status: DC | PRN
Start: 2017-12-29 — End: 2018-01-05
  Administered 2017-12-29: 650 mg via ORAL
  Filled 2017-12-29: qty 2

## 2017-12-29 NOTE — ED Notes (Signed)
Security was called

## 2017-12-29 NOTE — ED Triage Notes (Signed)
Patient reports he has PTSD and is bipolar, ran out of his medications 1 week ago.  Was drinking last night, snorted some coccaine and began having suicidal thoughts about shooting himself, states he also cuts himself and was feeling th urge to cut himself.
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
## 2017-12-30 DIAGNOSIS — R45851 Suicidal ideations: Secondary | ICD-10-CM

## 2017-12-30 DIAGNOSIS — F1410 Cocaine abuse, uncomplicated: Secondary | ICD-10-CM

## 2017-12-30 DIAGNOSIS — F4310 Post-traumatic stress disorder, unspecified: Secondary | ICD-10-CM

## 2017-12-30 DIAGNOSIS — N3000 Acute cystitis without hematuria: Secondary | ICD-10-CM | POA: Diagnosis present

## 2017-12-30 DIAGNOSIS — F333 Major depressive disorder, recurrent, severe with psychotic symptoms: Secondary | ICD-10-CM

## 2017-12-30 DIAGNOSIS — F1490 Cocaine use, unspecified, uncomplicated: Secondary | ICD-10-CM | POA: Diagnosis present

## 2017-12-30 LAB — URINALYSIS REFLEX TO MICROSCOPIC EXAM - REFLEX TO CULTURE
Bilirubin, UA: NEGATIVE
Blood, UA: NEGATIVE
Glucose, UA: NEGATIVE
Ketones UA: NEGATIVE
Nitrite, UA: NEGATIVE
Protein, UR: NEGATIVE
Specific Gravity UA: 1.014 (ref 1.001–1.035)
Urine pH: 7 (ref 5.0–8.0)
Urobilinogen, UA: NEGATIVE mg/dL

## 2017-12-30 LAB — ECG 12-LEAD
Atrial Rate: 80 {beats}/min
P Axis: 60 degrees
P-R Interval: 134 ms
Q-T Interval: 392 ms
QRS Duration: 88 ms
QTC Calculation (Bezet): 452 ms
R Axis: 63 degrees
T Axis: 45 degrees
Ventricular Rate: 80 {beats}/min

## 2017-12-30 LAB — HEMOGLOBIN A1C
Average Estimated Glucose: 91.1 mg/dL
Hemoglobin A1C: 4.8 % (ref 4.6–5.9)

## 2017-12-30 LAB — LIPID PANEL
Cholesterol / HDL Ratio: 2.7
Cholesterol: 157 mg/dL (ref 0–199)
HDL: 58 mg/dL (ref 40–9999)
LDL Calculated: 81 mg/dL (ref 0–99)
Triglycerides: 88 mg/dL (ref 34–149)
VLDL Calculated: 18 mg/dL (ref 10–40)

## 2017-12-30 LAB — HEMOLYSIS INDEX: Hemolysis Index: 4 (ref 0–18)

## 2017-12-30 LAB — TSH: TSH: 1.38 u[IU]/mL (ref 0.35–4.94)

## 2017-12-30 MED ORDER — RISAQUAD PO CAPS
1.00 | ORAL_CAPSULE | Freq: Every day | ORAL | Status: AC
Start: 2017-12-30 — End: 2018-01-03
  Administered 2017-12-30 – 2018-01-03 (×5): 1 via ORAL
  Filled 2017-12-30 (×5): qty 1

## 2017-12-30 MED ORDER — CLONIDINE HCL 0.1 MG PO TABS
0.10 mg | ORAL_TABLET | Freq: Four times a day (QID) | ORAL | Status: DC | PRN
Start: 2017-12-30 — End: 2018-01-05

## 2017-12-30 MED ORDER — NITROFURANTOIN MONOHYD MACRO 100 MG PO CAPS
100.00 mg | ORAL_CAPSULE | Freq: Two times a day (BID) | ORAL | Status: AC
Start: 2017-12-30 — End: 2018-01-03
  Administered 2017-12-30 – 2018-01-03 (×9): 100 mg via ORAL
  Filled 2017-12-30 (×9): qty 1

## 2017-12-30 MED ORDER — ARIPIPRAZOLE 10 MG PO TABS
20.00 mg | ORAL_TABLET | Freq: Every evening | ORAL | Status: DC
Start: 2017-12-30 — End: 2018-01-01
  Administered 2017-12-30 – 2017-12-31 (×2): 20 mg via ORAL
  Filled 2017-12-30 (×2): qty 2

## 2017-12-30 MED ORDER — HYDROXYZINE PAMOATE 25 MG PO CAPS
50.00 mg | ORAL_CAPSULE | Freq: Every evening | ORAL | Status: DC
Start: 2017-12-30 — End: 2018-01-05
  Administered 2017-12-30 – 2018-01-04 (×6): 50 mg via ORAL
  Filled 2017-12-30 (×6): qty 2

## 2017-12-30 MED ORDER — NALTREXONE HCL 50 MG PO TABS
50.00 mg | ORAL_TABLET | Freq: Every day | ORAL | Status: DC
Start: 2017-12-30 — End: 2018-01-05
  Administered 2017-12-30 – 2018-01-03 (×5): 50 mg via ORAL
  Filled 2017-12-30 (×7): qty 1

## 2017-12-30 MED ORDER — HYDROXYZINE PAMOATE 25 MG PO CAPS
25.00 mg | ORAL_CAPSULE | Freq: Four times a day (QID) | ORAL | Status: DC | PRN
Start: 2017-12-30 — End: 2018-01-05

## 2017-12-30 NOTE — Plan of Care (Signed)
Problem: At Risk for Suicide AS EVIDENCED BY...  Goal: Attends a minimum number of therapies daily  Outcome: Not Progressing  Patient has not attended any groups thus far today and has rarely been seen outside the patient's room. Patient has been encouraged to go to more groups and use coping skills as needed.

## 2017-12-30 NOTE — Discharge Instr - AVS First Page (Addendum)
Post Hospital Continuing Care Plan, including all 11 elements of the After Visit Summary (AVS)  were faxed, mailed, routed electronically - or can be viewed by all [facility] providers who have access to Epic-  on 01/05/18; emailed to [email or web address]; faxed to [facility] at [phone].    RECOMMENDED THAT YOU COMPLY WITH THE FOLLOWING PLAN:   Follow up appointment with:  [facility]                                              Date: 01/05/18      Time: 3pm  Location: [address], [place] [address]   P: [phone]   Email: [email or web address]    Outpatient Mental Health Services with: Walk in 8:30am for intake  [facility]  [address] [place], [place]. [address] [place], [place] [address]  Phone: [phone]  Email: [email or web address] and [facility]  [address], [place]  [place], [place] [address]  Phone: [phone]  Email: [email or web address]       1. An appointment is provided at [facility] for alcohol or drug use disorder counseling at: [phone]          FOR EMERGENCY MENTAL HEALTH, CONTACT: ___911_____    National Suicide Prevention Lifeline :  6574891931      The following were reviewed with patient/family by ___Ellen A-K___________ RN.     1. Reason for IP admission:   Diagnosis: ___Alcohol abuse, Cocaine abuse, Severe recurrent major depressive disorder with psychotic features and Post traumatic stress disorder (PTSD)______________________________     Precipitating event:___Addiction____________________________________________    2. Major procedures and tests, including summary of results--- discussed   3. Diagnosis at discharge--- Alcohol abuse, Cocaine abuse, Severe recurrent major depressive disorder with psychotic features and Post traumatic stress disorder (PTSD)  4. Current medication list-- discussed   5. Were studies pending at discharge?           No        6.  Patient instructions;  Adult  Wellness, Recovery, and Safety Plan--- completed   7. Emergency contact information--- [phone]  8. Plan for follow up care  -- discussed   9. Name of provider(s), appointment(s), and location(s) of follow up care.-- discussed                                               Advance Care Plan  10. Patient had a medical and mental health Advance Directive at admission.                                                                            No          11. If patient did not have a medical and mental health Advance Directive at admission:  information about completing Advance Directives or designating a surrogate decision maker, and a form, was provided.  After receiving  information- Did patient create a medical and mental health Advance Directive or appoint a surrogate decision maker?                                                                                                       No        If No:   12. If no, what is the reason?   ( Check reason)                 _____  Prefer to wait until I feel better        ___x__  Prefer to discuss with family or significant other        _____  Prefer to discuss with continuing care provider        _____  Do not believe I will need a mental health Advance Directive        _____  Other_____________________________________________

## 2017-12-30 NOTE — Plan of Care (Signed)
Problem: At Risk for Suicide AS EVIDENCED BY...  Goal: Patient will remain safe during hospitalization  Outcome: Progressing   12/30/17 0624   Goal/Interventions addressed this shift   Patient will remain safe during hospitalization Complete inpatient safety plan;Assess suicide risk using Tool for Assessment of Suicide Risk (TASR) (admission, prn, discharge);Assess Suicide Risk each shift using Suicide Assessment Tool (SAT);Initiate Suicide Alert Level and appropriate interventions based on SAT       Comments: Pt maintains safety. Currently sleeping. Q15 safety checks initiated.

## 2017-12-30 NOTE — Progress Notes (Signed)
Patient given urine sample cup and informed patient to notify staff with sample.

## 2017-12-30 NOTE — Treatment Plan (Signed)
Interdisciplinary Treatment Plan Update Meeting    12/30/2017  [name]    Participants:  Patient:  [name]  Attending Physician:  Napoleon Form NP  RN: Sharion Settler RN  Mental Health Therapist: Riki Sheer  Social Worker: Abe People    Objective:  Review response to treatment, reassess needs/goals, update plan as indicated incorporating patient's strengths and stated needs, goals, and preferences.    1. Summary of Patient Progress on Treatment Plan Goals:    Patient presented as with a typical affect and seemed to have racing thoughts. It was often difficult for the writer to understand the patient's quick and somewhat tangential reporting do to the speed he spoke and some mumbling.  Patient have a history of his mental health that was hard to follow and clarification was needed. Patient reported that he now lives in [place] but recently lived in [place]. He has recently relapsed in his alcohol use. RN reported that he was anxious and nauseated this morning.  Patient clarified his medications with the NP .  Treatment plan was explained to the patient as follows:    Plan is for the patient to coordinate with the MD regarding medication and to attend groups as he is able.  Social worker will coordinate with the patient to arrange aftercare when he is ready to discharge.     PHQ9: 27 completed on 12/29/2017    2. Level of Patient Involvement:  Actively engaged/contributing    3. Patient Understanding of Plan of Care:  Concrete understanding of primary goal/interventions    4. Level of Agreement/Commitment to Plan of Care:  Agrees with and is committed to plan of care          Contributor Signatures:      MD_________________________________ Date___________________    SW_________________________________Date ___________________    RN _________________________________Date____________________    Other________________________________Date ___________________    (This document is signed  electronically by Clinical research associate and electronic co-signer.  Other participants sign a printed copy which is scanned into the EMR)
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
## 2017-12-30 NOTE — Progress Notes (Signed)
[name] MRN: [id number]  57 y.o.  male    NUTRITION:  Reason for assessment: MST=2; unsure recent weight loss  100% po intake    Assessment   Past Medical History:   Diagnosis Date   . Arthritis    . Bipolar disorder    . PTSD (post-traumatic stress disorder)      Wt Readings from Last 30 Encounters:   12/29/17 107.7 kg (237 lb 8 oz)   12/29/17 104.3 kg (230 lb)     Social History     Social History   . Marital status: Single     Spouse name: N/A   . Number of children: N/A   . Years of education: N/A     Occupational History   . Not on file.     Social History Main Topics   . Smoking status: Never Smoker   . Smokeless tobacco: Never Used      Comment: Pt denies tobacco use in the last 30 days   . Alcohol use 8.4 oz/week     6 Cans of beer, 6 Glasses of wine, 2 Shots of liquor per week      Comment: Pt admits to drinking alcohol in the last 12 months.   . Drug use: Yes     Types: Cocaine      Comment: Pt admits to cocaine use in the last 12 months   . Sexual activity: Not Currently     Other Topics Concern   . Not on file     Social History Narrative   . No narrative on file       Active Hospital Problems    Diagnosis   . Alcohol abuse     Allergies   Allergen Reactions   . Lamictal [Lamotrigine]      GI Symptoms: none  Skin: Intact    Orders Placed This Encounter   Procedures   . Diet regular                 Current Meds:    folic acid 1 mg Daily   gabapentin 300 mg QID   multivitamin 1 tablet Daily   thiamine 100 mg Daily         Recent Labs:    Recent Labs  Lab 12/29/17  1453   WBC 5.19   Hgb 13.8   Hematocrit 43.7   MCV 90.1   Platelets 208       Recent Labs  Lab 12/29/17  1453   Sodium 139   Potassium 3.7   Chloride 102   CO2 26   BUN 19.0   Creatinine 0.9   Glucose 103*   Calcium 9.2   EGFR >60.0       Recent Labs  Lab 12/29/17  1453   Albumin 4.3     No intake or output data in the 24 hours ending 12/30/17 1158    Current Diet Order      Diet regular    Anthropometrics  Height: 190.5 cm (6'  3")  Weight: 107.7 kg (237 lb 8 oz)  Weight Change: 0  IBW/kg (Calculated) Male: 89.13 kg  IBW/kg (Calculated) Male: 79.51 kg  BMI (calculated): 29.7    Estimated Nutrition Needs:  Estimated Energy Needs  Total Energy Estimated Needs: 2300-2700 cal  Method for Estimating Needs: IBW x (25-300 cal    Estimated Protein Needs  Total Protein Estimated Needs: 91-1.2)g  Method for Estimating Needs: IBW x (1-1.2)g  Estimated Carbohydrate Needs  Total Carbohydrate Estimated Needs: 288-337g  Method for Estimating Needs: 50% of caloric needs    Fluid Needs  Total Fluid Estimated Needs: 2730 ml  Method for Estimating Needs: IBW x 30 ml    Learning & Discharge Planning Needs: None  Religious/Cultural Food Practices: None    Nutrition Diagnosis:   No nutrition diagnosis    Intervention:  Continue nutrition plan    Goals:  Pt maintains 75% or more po intake during hospital stay    M/E:  Monitor: po intake and labs  Follow up 01/08/18    [name] [id number]  12/30/17 @ 1205

## 2017-12-30 NOTE — Progress Notes (Signed)
12/30/17 1335   Healthcare Decisions   Interviewed: Patient   Orientation/Decision Making Abilities of Patient Alert and Oriented x3, able to make decisions   Advance Directive Patient does not have advance directive   Healthcare Agent Appointed No   Prior to admission   Prior level of function Independent with ADLs;Ambulates independently   Type of Residence Private residence   Living Arrangements Family members   Discharge Planning   Support Systems Family members   Anticipated [place] plan discussed with: Patient   Consults/Providers   PT Evaluation Needed 2   OT Evalulation Needed 2   SLP Evaluation Needed 2   Important Message from Medicare Notice   Patient received 1st IMM Letter? n/a

## 2017-12-30 NOTE — Plan of Care (Signed)
Problem: Addiction to alcohol or opioids or other substances AS EVIDENCED BY:  Goal: Patient achieves a safe detoxification and management of withdrawal symptoms  Outcome: Progressing   12/30/17 1141   Goal/Interventions addressed this shift   Patient achieves a safe detoxification and management of withdrawal symptoms Assess withdrawal signs/symptoms according to identified protocol (e.g. CIWA/COWS);Provide medication teaching including name, dosage, benefits, action, effect and side effects;Assess effectiveness (relief of withdrawal symptoms) and side effects of medication;Educate about health risks associated with withdrawal (e.g. seizures, DTs)     Goal: Patient educated about the disease of addiction and recovery and identifies long-term goal  Outcome: Progressing   12/30/17 1141   Goal/Interventions addressed this shift   Patient educated about the disease of addiction and recovery and identifies long-term goal Educate about the characteristics of alcohol/substance abuse/dependence;Assist patient to identify coping strategies to use as alternatives to alcohol/substance abuse/dependency;Encourage and monitor participation in all unit activities;Encourage participation in groups and 12 step meetings while on the unit       Problem: Mood Disorder  Goal: Reports improved mood  Outcome: Progressing   12/30/17 1141   Goal/Interventions addressed this shift   Reports improved mood  Encourage the patient to verbalize feelings of anxiety, anger, and fears;Frequent brief 1:1 conversations with the patient to assess mood /coping response       Comments: Patient met in room during 1:1. CIWA score 12, prn ativan given. Continue to monitor CIWA. Patient reports drinking 6 pack of beer and hard liquor daily. Patient presents with flat affect, slowed behavior, depressed mood. Patient cooperative with medications and aware of reason for hospitalization. Patient stated goal "to get through these withdrawals" Patient thus far  has not been to group, isolated to room. Hydrating and eating well.

## 2017-12-30 NOTE — Progress Notes (Signed)
CIWA score at 0800: 12, 1300: 11, and 1630: 1. Reassessed at 1800: 8. PRN ativan given based off CIWA scores.
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
## 2017-12-30 NOTE — Consults (Signed)
Consults   MEDICINE NEW CONSULT  [facility], DIVISION OF HOSPITALIST MEDICINE   [facility]   [place] Pager: [phone]      Date Time: 12/30/17 2:57 PM  Patient Name: [name] [name]  Requesting Physician: Berneta Levins, MD  Consulting Physician: Randal Buba, MD    Primary Care Physician: Marisa Sprinkles, MD    Reason for Consultation: Evaluation and management of any medical problems for patient admitted to inpatient psychiatric unit      Assessment:     Active Hospital Problems    Diagnosis   . Acute cystitis without hematuria   . Cocaine use   . Alcohol abuse       Recommendations:   Suspected UTI with abnormal urinalysis and symptoms of dysuria on admission--patient already received a dose of Macrobid in the emergency room on 8/6  -We will continue Macrobid twice daily for now with plan total of 5 days of antibiotics  -Repeat urinalysis and urine culture (appears urine culture not sent from emergency room on 8/6)    Alcohol abuse--management as per primary service/psychiatry, noted patient already on folic acid, thiamine, and PRN Ativan    Illicit drug use including with recent use of cocaine prior to admission  -Patient counseled at length on refraining from illicit drug use and risks of illicit drug use including with cocaine risk of heart attack, stroke, and even death    Pt may participate fully with psychiatric treatment, no contraindications identified.     Berneta Levins, MD, thank you for this consultation.  We will follow the patient with you during this hospitalization.  Please contact me with any questions or new issues.    History of Presenting Illness:   [name] is a 57 y.o. male with a past medical history including bipolar disorder, PTSD, and arthritis who is admitted to the psychiatric unit for further care including with suicidal ideation.  The patient does report some alcohol abuse including heavy alcohol use of roughly 3 sixpacks a day for the  last several days prior to admission.  The patient additionally reports illicit drug use with cocaine.  He states that he does not regularly use such.  The patient states that he is feeling somewhat "lousy" due to his recent alcohol use.  Patient denies any history of high blood pressure, diabetes, heart or lung disease.  The patient does report that he had symptoms of burning on urination when he first arrived to the emergency room.  He does think it is improving following a dose of Macrobid in the emergency room on 8/6.    Past Medical History:     Past Medical History:   Diagnosis Date   . Arthritis    . Bipolar disorder    . PTSD (post-traumatic stress disorder)        Available old records reviewed, including: EPIC      Past Surgical History:   History reviewed. No pertinent surgical history.    Family History:   History reviewed. No pertinent family history.--Patient denies any history of diabetes or hypertension family    Social History:     History   Smoking Status   . Never Smoker   Smokeless Tobacco   . Never Used     Comment: Pt denies tobacco use in the last 30 days     History   Alcohol Use   . 8.4 oz/week   . 6 Cans of beer, 6 Glasses of wine, 2  Shots of liquor per week     Comment: Pt admits to drinking alcohol in the last 12 months.     History   Drug Use   . Types: Cocaine     Comment: Pt admits to cocaine use in the last 12 months       Allergies:     Allergies   Allergen Reactions   . Lamictal [Lamotrigine]        Medications:     Prescriptions Prior to Admission   Medication Sig   . ARIPiprazole (ABILIFY PO) Take by mouth   . hydrOXYzine Pamoate (VISTARIL PO) Take by mouth   . nitrofurantoin, macrocrystal-monohydrate, (MACROBID) 100 MG capsule Take 1 capsule (100 mg total) by mouth 2 (two) times daily for 7 days       Current Facility-Administered Medications   Medication Dose Route Frequency   . ARIPiprazole  20 mg Oral QHS   . folic acid  1 mg Oral Daily   . gabapentin  300 mg Oral QID   .  hydrOXYzine  50 mg Oral QHS   . lactobacillus/streptococcus  1 capsule Oral Daily   . multivitamin  1 tablet Oral Daily   . naltrexone  50 mg Oral Daily   . nitrofurantoin (macrocrystal-monohydrate)  100 mg Oral Q12H SCH   . thiamine  100 mg Oral Daily            Review of Systems:   All other systems were reviewed and are negative except as per the HPI    Physical Exam:   Patient Vitals for the past 24 hrs:   BP Temp Temp src Pulse Resp SpO2 Height Weight   12/30/17 1300 110/73 97 F (36.1 C) - 89 16 - - -   12/30/17 0753 111/69 98.1 F (36.7 C) - 69 18 98 % - -   12/29/17 2236 125/81 97.9 F (36.6 C) Oral 80 16 97 % 1.905 m (6\' 3" ) 107.7 kg (237 lb 8 oz)     Body mass index is 29.69 kg/m.  No intake or output data in the 24 hours ending 12/30/17 1457    General: awake, alert, no acute distress.  HEENT: anicteric, oropharynx clear without lesions, mucous membranes moist  Neck: supple, no JVD  Cardiovascular: Normal S1 and S2, no gallop or rub  Lungs: clear to auscultation bilaterally without wheezing, rhonchi, or rales; breathing comfortably with no use of accessory muscles  Abdomen: soft, non-tender, non-distended, positive bowel sounds  Extremities: no cyanosis or edema  Neuro: Alert and awake, cranial nerves II through XII appear grossly intact, moving all extremities with strength appearing 5 out of 5  Skin: no rashes or lesions noted    Labs:     Recent Labs      12/29/17   1453   WBC  5.19   Hgb  13.8   Hematocrit  43.7   Platelets  208       Recent Labs      12/29/17   1453   Sodium  139   Potassium  3.7   Chloride  102   CO2  26   BUN  19.0   Creatinine  0.9   Glucose  103*   Calcium  9.2       Recent Labs      12/29/17   1453   AST (SGOT)  70*   ALT  53   Alkaline Phosphatase  115*   Protein, Total  7.8  Albumin  4.3   Bilirubin, Total  0.8       No results for input(s): PTT, PT, INR in the last 72 hours.            Signed by: Randal Buba, MD    cc: Berneta Levins, MD  Pcp, None, MD

## 2017-12-31 DIAGNOSIS — N3000 Acute cystitis without hematuria: Secondary | ICD-10-CM

## 2017-12-31 DIAGNOSIS — F1490 Cocaine use, unspecified, uncomplicated: Secondary | ICD-10-CM

## 2017-12-31 DIAGNOSIS — F1010 Alcohol abuse, uncomplicated: Secondary | ICD-10-CM

## 2017-12-31 MED ORDER — IBUPROFEN 400 MG PO TABS
400.00 mg | ORAL_TABLET | Freq: Four times a day (QID) | ORAL | Status: DC | PRN
Start: 2017-12-31 — End: 2018-01-05
  Administered 2017-12-31 – 2018-01-05 (×10): 400 mg via ORAL
  Filled 2017-12-31 (×10): qty 1

## 2017-12-31 NOTE — Plan of Care (Signed)
Problem: At Risk for Suicide AS EVIDENCED BY...  Goal: Attends a minimum number of therapies daily  Outcome: Not Progressing  Patient has not attended any groups thus far today and has rarely been seen outside the patient's room. Patient has been encouraged to go to more groups and use coping skills as needed.

## 2017-12-31 NOTE — Progress Notes (Signed)
SW Ardyth Harps contacted [facility] and [facility] for possible referral to inpatient treatment. Pt still has [place] Medicaid, programs only accept [place] Medicaid. [place] was contacted  It was reported pt needs to complete an in person intake at [address] on [address] from 7am-3:30pm.

## 2017-12-31 NOTE — Plan of Care (Signed)
Problem: Addiction to alcohol or opioids or other substances AS EVIDENCED BY:  Goal: Patient achieves a safe detoxification and management of withdrawal symptoms  Outcome: Progressing   12/31/17 0141   Goal/Interventions addressed this shift   Patient achieves a safe detoxification and management of withdrawal symptoms Assess withdrawal signs/symptoms according to identified protocol (e.g. CIWA/COWS);Provide medication teaching including name, dosage, benefits, action, effect and side effects;Assess effectiveness (relief of withdrawal symptoms) and side effects of medication;Educate about health risks associated with withdrawal (e.g. seizures, DTs);Ensure laboratory results are reviewed with the LIP to increase understanding of the medical consequences of addiction;Ensure adequate hydration and nutrition during withdrawal;Educate about the importance of reporting dizziness or unsteadiness while ambulating to care providers (e.g. RN, LIP)       Problem: Severe Anxiety  Goal: Verbalizes reduced anxiety  Outcome: Progressing   12/31/17 0141   Goal/Interventions addressed this shift   Verbalizes reduced anxiety  Assist to identify signs/symptoms of anxiety;Educate about stress reduction techniques and healthy coping responses;Facilitate expression of feelings, fears, concerns, anxiety       Comments: Received pt in bed and refused to come out even with several prompting. Pt presented depressed mood,labile affect, fair insight/judgement. Receptive on 1:1 with Clinical research associate during assessment but did not open his eyes.  Pt denied si/hi, but endorsed a/h. Pt stated "I hear voices all the time but does not understand what the voices are telling me." According to pt, he did not attend groups because he was tired and sick. CIWA was 3 at 2000. Night meds taken without any difficulty. No goal stated tonight.

## 2017-12-31 NOTE — Plan of Care (Signed)
Problem: Addiction to alcohol or opioids or other substances AS EVIDENCED BY:  Goal: Patient achieves a safe detoxification and management of withdrawal symptoms  Outcome: Progressing   12/31/17 2320   Goal/Interventions addressed this shift   Patient achieves a safe detoxification and management of withdrawal symptoms Assess withdrawal signs/symptoms according to identified protocol (e.g. CIWA/COWS);Provide medication teaching including name, dosage, benefits, action, effect and side effects;Assess effectiveness (relief of withdrawal symptoms) and side effects of medication;Educate about health risks associated with withdrawal (e.g. seizures, DTs);Ensure laboratory results are reviewed with the LIP to increase understanding of the medical consequences of addiction;Ensure adequate hydration and nutrition during withdrawal;Educate about the importance of reporting dizziness or unsteadiness while ambulating to care providers (e.g. RN, LIP)       Problem: Severe Anxiety  Goal: Verbalizes reduced anxiety  Outcome: Progressing   12/31/17 2320   Goal/Interventions addressed this shift   Verbalizes reduced anxiety  Assist to identify signs/symptoms of anxiety;Educate about stress reduction techniques and healthy coping responses;Facilitate expression of feelings, fears, concerns, anxiety       Comments: Visible in the hall this evening, pt not interacting with peers. Pt presented depressed mood, labile affect with  cooperative attitude, fair insight and judgement. Denied hi and stated" even if I want to harm my self I don't have a gun" but endorsed a/h. At 2000, CIWA was 2. Night meds tolerated well. [name] stated goal "is to go to sleep."

## 2017-12-31 NOTE — Plan of Care (Addendum)
Problem: At Risk for Suicide AS EVIDENCED BY...  Goal: Patient's recovery goal in his/her own words:  Outcome: Progressing   12/31/17 1348   Goal/Interventions addressed this shift   Patient's recovery goal in his/her own words:  Assist to identify goals for treatment based on individual needs and strengths;Assist patient to define criteria for discharge;Assist patient to sign acknowledgement of treatment plan participation   Patient found on bed lying down satted that his goal for today is," try to go to groups and walk around. Even I am very weak"    Problem: At risk for harm to others AS EVIDENCED BY...  Goal: Will report reduction of thoughts to harm others and no attempts  Outcome: Progressing   12/31/17 1348   Goal/Interventions addressed this shift   Will report reduction of thoughts to harm others and no attempts  Complete inpatient safety plan;Monitor for signs of escalating behavior;Limit participation in groups as indicated;Set simple direct limits on unsafe or aggressive behavior;Redirect and engage in activities;Reinforce pro social, non-violent conversation   patient is alert and oriented x 4. patient stated that has an infection, his urine culture is pending but in po antibiotics with probiotics. He endorses auditory hallucinations, stated" I heard voices that tell my find a way to hurt myself" I drink al lot of alcohol and I think this is a side effect of too much alcohol" CIWA protocol every 4 hours. Total is 4. Mild anxiety and agitation with still visible tremors on both hands. Depressed mood and labile affect. Restless.    Problem: Mood Disorder  Goal: Reports improved mood  Outcome: Progressing   12/31/17 1348   Goal/Interventions addressed this shift   Reports improved mood  Encourage the patient to verbalize feelings of anxiety, anger, and fears;Frequent brief 1:1 conversations with the patient to assess mood /coping response   patient is lying on bed the most of the time, only came out to  have meals. Made patient come to have med's to nurse station. Patient is isolated to his room. Reports some arthritic pain on both knees an requested ibuprofen po. Eating and drinking properly. Steady gait.
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
## 2017-12-31 NOTE — Progress Notes (Signed)
MEDICINE PROGRESS NOTE  [facility], DIVISION OF HOSPITALIST MEDICINE   [facility]   [place] Pager: [phone]      Date Time: 12/31/17 3:42 PM  Patient Name: [name]  Attending Physician: Berneta Levins, MD  Hospital Day: 3  Assessment:     Active Hospital Problems    Diagnosis   . Acute cystitis without hematuria   . Cocaine use   . Alcohol abuse       Plan:   Suspected UTI with abnormal urinalysis and symptoms of dysuria on admission--patient already received a dose of Macrobid in the emergency room on 8/6  -Continue Macrobid twice daily with 5-day treatment planned  (Urine culture from 8/8 may be unrevealing as patient had already been started on Macrobid prior to sending culture; noted urinalysis on 8/7 already improving as compared to urinalysis on 8/6)    Alcohol abuse--management as per primary service/psychiatry, noted patient already on folic acid, thiamine, and PRN Ativan    Illicit drug use including with recent use of cocaine prior to admission  -Patient counseled at length on 8/7 on refraining from illicit drug use and risks of illicit drug use including with cocaine risk of heart attack, stroke, and even death    Will sign off at this time.  Please call back with any questions.  Thank you for the consultation    Subjective     CC: UTI  HPI/Subjective: Patient seen and examined.  Patient reports that his dysuria has improved from presentation in the ER.  Patient denies any pain or shortness of breath.    Review of Systems:   Review of Systems - Negative except as above in HPI    Physical Exam:     Temp:  [96.9 F (36.1 C)-99 F (37.2 C)] 97.5 F (36.4 C)  Heart Rate:  [76-94] 76  Resp Rate:  [16-18] 16  BP: (105-119)/(72-76) 119/76    Intake/Output Summary (Last 24 hours) at 12/31/17 1542  Last data filed at 12/31/17 1100   Gross per 24 hour   Intake              400 ml   Output                0 ml   Net              400 ml    General: alert and awake, no  distress  HEENT: anicteric, moist mucous membranes  CVS: nl S1, S2; no gallop or rub  Lungs: clear to auscultation b/l, no wheezing or rhonchi; breathing comfortably with no use of accessory muscles  Abd: soft, non-tender, non-distended  Ext: no pedal edema, no cyanosis       Meds:   Medications were reviewed:  Scheduled Meds:  Current Facility-Administered Medications   Medication Dose Route Frequency   . ARIPiprazole  20 mg Oral QHS   . folic acid  1 mg Oral Daily   . gabapentin  300 mg Oral QID   . hydrOXYzine  50 mg Oral QHS   . lactobacillus/streptococcus  1 capsule Oral Daily   . multivitamin  1 tablet Oral Daily   . naltrexone  50 mg Oral Daily   . nitrofurantoin (macrocrystal-monohydrate)  100 mg Oral Q12H SCH   . thiamine  100 mg Oral Daily     Continuous Infusions:  PRN Meds:.acetaminophen, bisacodyl, cetirizine, cloNIDine, famotidine, hydrOXYzine, ibuprofen, LORazepam, magnesium hydroxide, QUEtiapine, QUEtiapine  Labs/Radiology:   Imaging personally  reviewed, including: all available   No results found.  No results for input(s): GLUCOSEWB in the last 24 hours.    Recent Labs  Lab 12/29/17  1453   Sodium 139   Potassium 3.7   Chloride 102   BUN 19.0   Creatinine 0.9   EGFR >60.0   Glucose 103*   Calcium 9.2       Recent Labs  Lab 12/29/17  1453   WBC 5.19   Hgb 13.8   Hematocrit 43.7   Platelets 208           Recent Labs  Lab 12/29/17  1453   Alkaline Phosphatase 115*   Bilirubin, Total 0.8   ALT 53   AST (SGOT) 70*       This note was generated by the Epic EMR system/ Dragon speech recognition and may contain inherent errors or omissions not intended by the user. Grammatical errors, random word insertions, deletions and pronoun errors  are occasional consequences of this technology due to software limitations. Not all errors are caught or corrected. If there are questions or concerns about the content of this note or information contained within the body of this dictation they should be addressed directly  with the author for clarification.    Signed by: Randal Buba, MD

## 2018-01-01 LAB — HEPATIC FUNCTION PANEL
ALT: 34 U/L (ref 0–55)
AST (SGOT): 22 U/L (ref 5–34)
Albumin/Globulin Ratio: 1.1 (ref 0.9–2.2)
Albumin: 3.7 g/dL (ref 3.5–5.0)
Alkaline Phosphatase: 88 U/L (ref 38–106)
Bilirubin Direct: 0.2 mg/dL (ref 0.0–0.5)
Bilirubin Indirect: 0.1 mg/dL — ABNORMAL LOW (ref 0.2–1.0)
Bilirubin, Total: 0.3 mg/dL (ref 0.2–1.2)
Globulin: 3.5 g/dL (ref 2.0–3.6)
Protein, Total: 7.2 g/dL (ref 6.0–8.3)

## 2018-01-01 MED ORDER — BUSPIRONE HCL 5 MG PO TABS
10.00 mg | ORAL_TABLET | Freq: Two times a day (BID) | ORAL | Status: DC
Start: 2018-01-01 — End: 2018-01-04
  Administered 2018-01-01 – 2018-01-02 (×4): 10 mg via ORAL
  Filled 2018-01-01 (×7): qty 2

## 2018-01-01 MED ORDER — ARIPIPRAZOLE 10 MG PO TABS
30.00 mg | ORAL_TABLET | Freq: Every evening | ORAL | Status: DC
Start: 2018-01-01 — End: 2018-01-05
  Administered 2018-01-01 – 2018-01-04 (×4): 30 mg via ORAL
  Filled 2018-01-01 (×4): qty 3

## 2018-01-01 MED ORDER — AMITRIPTYLINE HCL 25 MG PO TABS
25.00 mg | ORAL_TABLET | Freq: Every evening | ORAL | Status: DC | PRN
Start: 2018-01-01 — End: 2018-01-05
  Filled 2018-01-01: qty 1
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
## 2018-01-01 NOTE — UM Notes (Signed)
IP: UR Review:  DOA:12/29/17 VOL.  IP: Mcg guidelines met:: Depression/ SI  W.plan.  -------------------------------------    Per ER/MD: note: (12/29/17)   57 y.o. male h/o Bipolar disorder, PTSD, presenting to the ED with worsening SI since yesterday. States he was feeling depressed after being ignored by a woman he had a romantic interest in and he was drinking and using cocaine yesterday.   He then had thoughts of suicide, planning to take his friends gun and kill himself. Before pt acted out on any ideas his friend brought him to the hospital. States he ran out of his Abilify and Vistaril a week ago while at a family reunion and hasn't gotten a new Rx yet. Denies HI or any c/o pain.  -------------------------------------  ADMIT: 3A/ IP Psychiatric Unit,  15 checks  For  Safety  1:1 , groups  Medication Management.  -------------------------------------------  Per MD/H/P: DX/ Medication TRT Plan:  Reason for Admission:   Suicidal ideations with a plan, alcohol and cocaine abuse   Diagnosis:      Axis I: Alcohol abuse, Cocaine abuse, Severe recurrent major depressive disorder with psychotic features and Post traumatic stress disorder (PTSD)   Axis XB:JYNWG   Axis III: See problem list in the medical record   Axis IV: Other psychosocial or environmental problems   Problems with access to health care services   Axis V:21-30: behavior considerably influenced by delusions or hallucinations OR serious impairment in judgment, communication OR inability to function in almost all areas.: Highest in the last year 60.    Patient admitted to unit 3A, the following medications are being prescribed: Abilify, vistaril, naltrexone, and gabapentin. A comprehensive treatment plan will be developed, side effects of medications have been reviewed with the patient. Risks and benefits of medications was explained with informed consent obtained from patient. CIWA protocol initiated to monitor  withdrawals.    -----------------------------------------    Insurance:  Medicaid/ [place]  [employer]

## 2018-01-01 NOTE — Plan of Care (Signed)
Problem: At Risk for Suicide AS EVIDENCED BY...  Goal: Attends a minimum number of therapies daily  Outcome: Not Progressing  Patient attended groups this morning but none in the afternoon. He did come out to ask for help finding treatment after hospitalization and was referred to his case Production designer, theatre/television/film. Patient has been encouraged to go to more groups and use coping skills as needed.

## 2018-01-01 NOTE — Plan of Care (Addendum)
Problem: At Risk for Suicide AS EVIDENCED BY...  Goal: Patient's recovery goal in his/her own words:  Outcome: Progressing   01/01/18 1042   Goal/Interventions addressed this shift   Patient's recovery goal in his/her own words:  Assist to identify goals for treatment based on individual needs and strengths;Assist patient to define criteria for discharge;Assist patient to sign acknowledgement of treatment plan participation   Patient found on bed this am stated that his goal for today is to go to groups and walk around. I need help with some support groups outside" case manger referral done. Pt asking for phone numbers for possible referrals, were provided.    Problem: Addiction to alcohol or opioids or other substances AS EVIDENCED BY:  Goal: Patient achieves a safe detoxification and management of withdrawal symptoms  Outcome: Progressing   01/01/18 1042   Goal/Interventions addressed this shift   Patient achieves a safe detoxification and management of withdrawal symptoms Assess withdrawal signs/symptoms according to identified protocol (e.g. CIWA/COWS);Provide medication teaching including name, dosage, benefits, action, effect and side effects;Educate about health risks associated with withdrawal (e.g. seizures, DTs);Ensure adequate hydration and nutrition during withdrawal   Patient is sleeping better but still endorses suicidal ideation and has a plan when he is being discharge, if I have a gum or a knife will used against me. Positive for auditory hallucinations, hearing voices that command him to kill himself. You do not have anything to do here " patient CIWA is 2. Agitated and aggressive around times. Restless with depressed mood and labile affect.     Problem: Severe Anxiety  Goal: Verbalizes reduced anxiety  Outcome: Progressing   01/01/18 1042   Goal/Interventions addressed this shift   Verbalizes reduced anxiety  Assist to identify signs/symptoms of anxiety   patient getting anxious today about being  discharge and what to do when he is out. Buspar 10 mg po given. Vital signs stable. Both knee have arthritis and complains about aching pain. On ibuprofen prn and gabapentin. Denies chest pain or shortness of breath. Had bowel movement yesterday. urine culture shows streptococcus agalactiae,  patient is being treated with macrobid  po antibiotics .

## 2018-01-02 DIAGNOSIS — N481 Balanitis: Secondary | ICD-10-CM | POA: Diagnosis not present

## 2018-01-02 MED ORDER — NYSTATIN 100000 UNIT/GM EX POWD
Freq: Two times a day (BID) | CUTANEOUS | Status: DC
Start: 2018-01-02 — End: 2018-01-05
  Filled 2018-01-02: qty 15

## 2018-01-02 MED ORDER — HYDROCORTISONE 1 % EX CREA
TOPICAL_CREAM | Freq: Two times a day (BID) | CUTANEOUS | Status: DC
Start: 2018-01-02 — End: 2018-01-05
  Filled 2018-01-02: qty 28.4

## 2018-01-02 NOTE — Progress Notes (Signed)
MEDICINE PROGRESS NOTE  [facility], DIVISION OF HOSPITALIST MEDICINE   [facility]   [place] Pager: [phone]      Date Time: 01/02/18 3:59 PM  Patient Name: [name]  Attending Physician: Berneta Levins, MD  Hospital Day: 5  Assessment:     Active Hospital Problems    Diagnosis   . Balanitis   . Acute cystitis without hematuria   . Cocaine use   . Alcohol abuse       Plan:   UTI with abnormal urinalysis and symptoms of dysuria on admission--patient already received a dose of Macrobid in the emergency room on 8/6  -Completing 5-day course of Macrobid    Balanitis  -Nystatin powder twice daily and hydrocortisone 1% cream twice daily for 7 days    Alcohol abuse--management as per primary service/psychiatry    Will sign off at this time.  Please call back with any questions.  Thank you for the consultation    Subjective     CC: UTI  HPI/Subjective: Patient seen and examined.  Patient reports history of balanitis and that he follows with urologist.  He has been told repeated bouts of this unless he has a circumcision but has not wanted to do such yet.  He states that he typically has good response with nystatin and hydrocortisone cream twice a day.  Patient reports he is currently having some irritation and symptoms consistent with his typical bouts of balanitis.    Review of Systems:   Review of Systems - Negative except as above in HPI    Physical Exam:     Temp:  [97.7 F (36.5 C)] 97.7 F (36.5 C)  Heart Rate:  [68-78] 68  Resp Rate:  [16-18] 16  BP: (108-112)/(66-72) 108/66    Intake/Output Summary (Last 24 hours) at 01/02/18 1559  Last data filed at 01/02/18 1000   Gross per 24 hour   Intake              600 ml   Output                0 ml   Net              600 ml    General: alert and awake, no distress  HEENT: anicteric, moist mucous membranes  CVS: nl S1, S2; no gallop or rub  Lungs: clear to auscultation b/l, no wheezing or rhonchi; breathing comfortably with no use of  accessory muscles  Ext: no pedal edema, no cyanosis  GU: Mild hypopigmented area on tip of foreskin penis       Meds:   Medications were reviewed:  Scheduled Meds:  Current Facility-Administered Medications   Medication Dose Route Frequency   . ARIPiprazole  30 mg Oral QHS   . busPIRone  10 mg Oral BID   . folic acid  1 mg Oral Daily   . gabapentin  300 mg Oral QID   . hydrocortisone   Topical BID   . hydrOXYzine  50 mg Oral QHS   . lactobacillus/streptococcus  1 capsule Oral Daily   . multivitamin  1 tablet Oral Daily   . naltrexone  50 mg Oral Daily   . nitrofurantoin (macrocrystal-monohydrate)  100 mg Oral Q12H SCH   . nystatin   Topical BID   . thiamine  100 mg Oral Daily     Continuous Infusions:  PRN Meds:.acetaminophen, amitriptyline, bisacodyl, cetirizine, cloNIDine, famotidine, hydrOXYzine, ibuprofen, LORazepam, magnesium hydroxide  Labs/Radiology:  Imaging personally reviewed, including: all available   No results found.  No results for input(s): GLUCOSEWB in the last 24 hours.    Recent Labs  Lab 12/29/17  1453   Sodium 139   Potassium 3.7   Chloride 102   BUN 19.0   Creatinine 0.9   EGFR >60.0   Glucose 103*   Calcium 9.2       Recent Labs  Lab 12/29/17  1453   WBC 5.19   Hgb 13.8   Hematocrit 43.7   Platelets 208           Recent Labs  Lab 01/01/18  0642 12/29/17  1453   Alkaline Phosphatase 88 115*   Bilirubin, Total 0.3 0.8   Bilirubin, Direct 0.2  --    ALT 34 53   AST (SGOT) 22 70*       This note was generated by the Epic EMR system/ Dragon speech recognition and may contain inherent errors or omissions not intended by the user. Grammatical errors, random word insertions, deletions and pronoun errors  are occasional consequences of this technology due to software limitations. Not all errors are caught or corrected. If there are questions or concerns about the content of this note or information contained within the body of this dictation they should be addressed directly with the author for  clarification.    Signed by: Randal Buba, MD

## 2018-01-02 NOTE — Plan of Care (Signed)
Problem: At Risk for Suicide AS EVIDENCED BY...  Goal: Attends a minimum number of therapies daily  Outcome: Not Progressing  Patient has not attended any groups thus far today.   Will continue to encourage group attendance and participation in order to increase positive coping skills and express feelings regarding current situation.

## 2018-01-02 NOTE — Plan of Care (Addendum)
Problem: At Risk for Suicide AS EVIDENCED BY...  Goal: Identifies stressors, protective factors, and coping skills  Outcome: Progressing   01/02/18 0115   Goal/Interventions addressed this shift   Identifies stressors, protective factors, and coping skills Assist patient to identify stressors, protective factors, and develop coping skills       Problem: At risk for harm to others AS EVIDENCED BY...  Goal: Will report reduction of thoughts to harm others and no attempts  Outcome: Progressing   01/02/18 0115   Goal/Interventions addressed this shift   Will report reduction of thoughts to harm others and no attempts  Complete inpatient safety plan;Monitor for signs of escalating behavior;Offer PRN medication as needed to help patient de-escalate;Limit participation in groups as indicated;Set simple direct limits on unsafe or aggressive behavior;Redirect and engage in activities;Assess patient for aggressive thoughts;Reinforce pro social, non-violent conversation       Comments: Abu dismissed suicidal/homicidal thoughts, AH/AVH, pain and anxiety. Pt's mood is depressed, affect is labile and cooperative, able to make his needs known. Pt reported attending groups and using coping skills. Pt was encouraged to voice his concerns to staff and verbalized understanding. Goal is "to stay safe".  C/o of generalized body pain rated 5/10. Medications compliant. CIWA was 2 at 2000. Will continue to monitor.    Requested for and received Ibuprofen 400mg  with a positive outcome.
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
## 2018-01-02 NOTE — Plan of Care (Addendum)
Problem: At Risk for Suicide AS EVIDENCED BY...  Goal: Patient's recovery goal in his/her own words:  Outcome: Progressing   01/02/18 1026   Goal/Interventions addressed this shift   Patient's recovery goal in his/her own words:  Assist to identify goals for treatment based on individual needs and strengths;Assist patient to define criteria for discharge;Assist patient to sign acknowledgement of treatment plan participation   Patient alert and oriented x 4. Stated that his goal for today is," feeling better. I have some rash and redness on my penis, and I need some meds." q 15 minutes checks for safety. Intentional hourly round done.    Problem: At risk for harm to others AS EVIDENCED BY...  Goal: Will report reduction of thoughts to harm others and no attempts  Outcome: Progressing   01/02/18 1026   Goal/Interventions addressed this shift   Will report reduction of thoughts to harm others and no attempts  Complete inpatient safety plan;Monitor for signs of escalating behavior;Limit participation in groups as indicated;Redirect and engage in activities;Reinforce pro social, non-violent conversation   patient is sleeping better, eating well.  Anxious about being discharge. Denies chest pain, shortness of breath. Still has minimal discomfort on both knees, but gabapentin po is helping. Eating and drinking well.    Problem: Addiction to alcohol or opioids or other substances AS EVIDENCED BY:  Goal: Patient achieves a safe detoxification and management of withdrawal symptoms  Outcome: Progressing   01/02/18 1026   Goal/Interventions addressed this shift   Patient achieves a safe detoxification and management of withdrawal symptoms Assess withdrawal signs/symptoms according to identified protocol (e.g. CIWA/COWS);Provide medication teaching including name, dosage, benefits, action, effect and side effects   ciwa is 2 this am, a little anxious. Not agitation noted. No nausea or vomiting. No headache. No  tremors.    Problem: Mood Disorder  Goal: Reports improved mood  Outcome: Progressing   01/02/18 1026   Goal/Interventions addressed this shift   Reports improved mood  Encourage the patient to verbalize feelings of anxiety, anger, and fears;Frequent brief 1:1 conversations with the patient to assess mood /coping response   Patient denies depression, endorses SI but not active. I feel that I am improving",denies HI, but endorses auditory hallucinations but unable to heard what the voices tell him. Patient going to groups and ambulating around the unit but not visible interacting with peers. Usually spend the time on his bed alone or on the phone. On po antibiotics until tomorrow 8/11 for urinary tract infection.

## 2018-01-02 NOTE — Progress Notes (Signed)
Patient found this am on bed. Stated," when I have this urinary infections, after some days I have rash, redness and sometimes discharge. I need some medication for that" MD notified chlamydia, gonorrhea and urinalysis sent. awaiting results. patient will be in Macrobid po until 01/03/2018.

## 2018-01-03 NOTE — Progress Notes (Signed)
Medicated twice this shift with Ibuprofen 400 mg for bilateral knee pain with some positive effect on reassessment.

## 2018-01-03 NOTE — Plan of Care (Signed)
[name] declined most of his scheduled medications with the exception of his vitamins. He stated " these medications are making me sick and I do not need them, I only came here for Vistaril at night so I can sleep"; patient reported nausea and was educated of withdrawal signs but was not receptive and kept blaming the psychiatrist for putting him on medications he believes he does not need.; denies withdrawal symptoms, declined Neurontin, Buspar, because " all these medications are making me sick". Unstable mood, poor insight and judgement; no interactions with peers. Non compliant for treatment.
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
## 2018-01-03 NOTE — Plan of Care (Addendum)
Problem: At Risk for Suicide AS EVIDENCED BY...  Goal: Verbalizes understanding of medication, benefits, and side effects  Outcome: Progressing   01/03/18 0218   Goal/Interventions addressed this shift   Verbalizes understanding of medication, benefits, and side effects Ensure Informed consent for all psychotropic medications;Provide medication teaching including name, dosage, benefits, action, effect and side effects;Encourage to report response to medications including side effects       Problem: Mood Disorder  Goal: Reports improved mood  Outcome: Progressing   01/03/18 0218   Goal/Interventions addressed this shift   Reports improved mood  Encourage the patient to verbalize feelings of anxiety, anger, and fears;Frequent brief 1:1 conversations with the patient to assess mood /coping response       Comments: [name] was received in the hallway isolated to self, pt rated depression 5/10, denying si/hi/avh. Pt denies itching on his private part. Pt was compliant with all medications. Rated b/l knee and shoulder pain 6/10. Group attendance and participation encouraged, receptive. Goal this shift is "to rest". CIWA at 1954 and 0300 was 1. No distress noted, pt maintains safety.

## 2018-01-03 NOTE — Plan of Care (Signed)
Problem: At Risk for Suicide AS EVIDENCED BY...  Goal: Attends a minimum number of therapies daily  Outcome: Progressing  [name] attended process group today. He said he is feeling better and plans to go to the [facility] for a rehab program.  He said he will spend more time on the unit today.  Will continue to encourage group attendance.

## 2018-01-04 LAB — URINE CHLAMYDIA/NEISSERIA BY PCR
Chlamydia DNA by PCR: NEGATIVE
Neisseria gonorrhoeae by PCR: NEGATIVE

## 2018-01-04 NOTE — Progress Notes (Signed)
Chaplain Service      Background:  Visit Type: Initial was made by Chaplain with patient, [name], based on Source: Patient Request.  Present at Visit: patient.  Spiritual Care Provided to: patient only.  Length of Visit: 0-15 minutes .    Summary:  Spiritual Care Interventions: Explored belief and/or faith issues, Provided emotional support, Provided comfort, encouragement,affirmation, Provided spiritual encouragement, Provided prayer, Provided spiritual resources, Made a positive connection with patient  Reason for Request: Spiritual Assessment, Spiritual Support   Spiritual Care Outcomes: Increased sense of hope, Patient expressed appreciation of visit
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
## 2018-01-04 NOTE — Plan of Care (Signed)
Problem: At Risk for Suicide AS EVIDENCED BY...  Goal: Attends a minimum number of therapies daily  Outcome: Not Progressing  Patient has not attended any groups thus far today.   Will continue to encourage group attendance and participation in order to increase positive coping skills and express feelings regarding current situation.

## 2018-01-04 NOTE — Plan of Care (Signed)
Problem: Addiction to alcohol or opioids or other substances AS EVIDENCED BY:  Goal: Patient achieves a safe detoxification and management of withdrawal symptoms  Outcome: Progressing   01/04/18 0316   Goal/Interventions addressed this shift   Patient achieves a safe detoxification and management of withdrawal symptoms Assess withdrawal signs/symptoms according to identified protocol (e.g. CIWA/COWS);Provide medication teaching including name, dosage, benefits, action, effect and side effects;Assess effectiveness (relief of withdrawal symptoms) and side effects of medication;Educate about health risks associated with withdrawal (e.g. seizures, DTs);Ensure laboratory results are reviewed with the LIP to increase understanding of the medical consequences of addiction;Ensure adequate hydration and nutrition during withdrawal;Educate about the importance of reporting dizziness or unsteadiness while ambulating to care providers (e.g. RN, LIP)   Pt visible in the milieu interacting with a selected peer. Presents with an unstable mood, flat affect and a blaming attitude. Denies SI/HI/AVH, depression, anxiety and pain. Declined to take his scheduled Neurontin 300mg  stating "I'm not going to allow anyone to play with my body. I know what makes me sick, and what makes me feel better. Tell the doctor to stop playing Dr. Marquita Palms with me". Requested and received Ibuprofen 400mg  for bilateral knee pain of 3/10 with  effective results. Slept throughout the night with no distress noted. Will continue to monitor for safety and needs.

## 2018-01-04 NOTE — Plan of Care (Signed)
[name] presented very defensive, irritable, blaming and intimidating upon greeting this morning; he first remained silent for few seconds, then replied " I do not want any medications that I was not on prior to my admission, I know my body better than any of you and I do not understand why I am on hundreds of pills that my body does not need"; patient was encouraged to discuss his medications with his treatment team.  He declined Revia, Buspar, hydrocortisone cream, nystatin powder and accepted only his vitamins in addition of PRN Ibuprofen for his bilateral knee pain. Ritik's affect is flat/blunted with uncooperative attitude and poor insight/judgement. Declined to state a daily goal.

## 2018-01-05 MED ORDER — FOLIC ACID 1 MG PO TABS
1.00 mg | ORAL_TABLET | Freq: Every day | ORAL | 0 refills | Status: DC
Start: 2018-01-06 — End: 2021-02-08

## 2018-01-05 MED ORDER — NALTREXONE HCL 50 MG PO TABS
50.00 mg | ORAL_TABLET | Freq: Every day | ORAL | 0 refills | Status: DC
Start: 2018-01-06 — End: 2021-02-08

## 2018-01-05 MED ORDER — IBUPROFEN 400 MG PO TABS
800.00 mg | ORAL_TABLET | Freq: Two times a day (BID) | ORAL | 0 refills | Status: DC | PRN
Start: 2018-01-05 — End: 2021-02-08

## 2018-01-05 MED ORDER — ARIPIPRAZOLE 30 MG PO TABS
30.00 mg | ORAL_TABLET | Freq: Every evening | ORAL | 0 refills | Status: DC
Start: 2018-01-05 — End: 2019-03-21

## 2018-01-05 MED ORDER — HYDROCORTISONE 1 % EX CREA
TOPICAL_CREAM | Freq: Two times a day (BID) | CUTANEOUS | 0 refills | Status: AC
Start: 2018-01-05 — End: ?

## 2018-01-05 MED ORDER — HYDROXYZINE PAMOATE 25 MG PO CAPS
25.00 mg | ORAL_CAPSULE | Freq: Two times a day (BID) | ORAL | 0 refills | Status: DC | PRN
Start: 2018-01-05 — End: 2021-02-08

## 2018-01-05 MED ORDER — THIAMINE HCL 100 MG PO TABS
100.00 mg | ORAL_TABLET | Freq: Every day | ORAL | 0 refills | Status: DC
Start: 2018-01-06 — End: 2021-02-08

## 2018-01-05 MED ORDER — TAB-A-VITE/BETA CAROTENE PO TABS
1.00 | ORAL_TABLET | Freq: Every day | ORAL | 0 refills | Status: DC
Start: 2018-01-06 — End: 2021-02-08

## 2018-01-05 MED ORDER — NYSTATIN 100000 UNIT/GM EX POWD
Freq: Two times a day (BID) | CUTANEOUS | 0 refills | Status: DC
Start: 2018-01-05 — End: 2021-02-13

## 2018-01-05 NOTE — Discharge Summary (Signed)
PSYCHIATRY DISCHARGE SUMMARY  AND POST DISCHARGE CONTINUING CARE PLAN    Date/Time: 01/05/2018 9:46 AM  Patient Name: [name], [name]  MRN#: [id number]  Age: 57 y.o.   Date of Birth: Oct 09, 1960    Date of Admission:   12/29/2017  Date of Discharge:     01/06/2018  Admitting Physician:    London Pepper, MD  Discharge Physician:    Napoleon Form, NP     Event leading to hospitalization / Reason for Hospitalization   Chief Complaint or Reason for Admission  Suicidal thoughts with a plan to kill himself with his friend's gun.     Legal Status on admission was voluntary.    Discharge Diagnoses:       Principal Discharge Diagnosis:  Bipolar disorder, current episode depressed, severe, with psychotic features      Other  Psychiatric Diagnoses   Axis I: Alcohol abuse, Cocaine abuse, Bipolar disorder, current episode depressed, severe, with psychotic features  and Post traumatic stress disorder (PTSD) by history   Axis VW:UJWJX   Axis III: See problem list in the medical record   Axis IV: Other psychosocial or environmental problems   Problems with access to health care services   Axis V:  GAF at  Admission: 21-30: behavior considerably influenced by delusions or hallucinations OR serious impairment in judgment, communication OR inability to function in almost all areas.          GAF at Discharge:71-80: transient mild symptoms or brief and mild impairment.    Comorbid Conditions: Alcohol use Disorder - current or past, Housing -unstable or none and Medication noncompliance    Labs/Scans/EKG     Results     Procedure Component Value Units Date/Time    Urine Chlamydia/GC by PCR [[id number]] Collected:  01/02/18 1021     Updated:  01/04/18 1152     Chlamydia DNA by PCR Negative     Neisseria gonorrhoeae by PCR Negative    Urinalysis with microscopic - No Culture [[id number]] Collected:  01/02/18 1021    Specimen:  Urine Updated:  01/02/18 1021    Hepatic function panel (LFT) [[id number]]  (Abnormal) Collected:  01/01/18 0642     Specimen:  Blood Updated:  01/01/18 0751     Bilirubin, Total 0.3 mg/dL      Bilirubin, Direct 0.2 mg/dL      Bilirubin, Indirect 0.1 (L) mg/dL      AST (SGOT) 22 U/L      ALT 34 U/L      Alkaline Phosphatase 88 U/L      Protein, Total 7.2 g/dL      Albumin 3.7 g/dL      Globulin 3.5 g/dL      Albumin/Globulin Ratio 1.1    Adult Urine culture [[id number]] Collected:  12/30/17 1628    Specimen:  Urine from Urine, Clean Catch Updated:  12/31/17 1526    Narrative:       Replace urinary catheter prior to obtaining the urine culture  if it has been in place for greater than or equal to 14  days:->N/A No Foley  Indications for Urine Culture:->Suprapubic Pain/Tenderness or  Dysuria  ORDER#: [id number]                                    ORDERED BY: Cheree Ditto  SOURCE: Urine, Clean Catch  COLLECTED:  12/30/17 16:28  ANTIBIOTICS AT COLL.:                                RECEIVED :  12/30/17 18:30  Culture Urine                              FINAL       12/31/17 15:26   +  12/31/17   1,000 - 9,000 CFU/ML Streptococcus agalactiae - (Group B)               Penicillin and ampicillin are the drugs of choice for             treatment of B-hemolytic Streptococcal infections.             Non-susceptible isolates are extremely rare.             Susceptibility testing is only performed upon request.             CLSI M100-S24 2014        TSH [[id number]] Collected:  12/30/17 0726     Updated:  12/30/17 1807     TSH 1.38 uIU/mL     ADULT Urinalysis Reflex to Microscopic Exam - Reflex to Culture [[id number]]  (Abnormal) Collected:  12/30/17 1628     Updated:  12/30/17 1658     Urine Type Urine, Clean Ca     Color, UA Yellow     Clarity, UA Sl Cloudy (A)     Specific Gravity UA 1.014     Urine pH 7.0     Leukocyte Esterase, UA Small (A)     Nitrite, UA Negative     Protein, UR Negative     Glucose, UA Negative     Ketones UA Negative     Urobilinogen, UA Negative mg/dL      Bilirubin, UA Negative     Blood, UA  Negative     WBC, UA 11 - 25 (A) /hpf      Squamous Epithelial Cells, Urine 0 - 5 /hpf     Narrative:       Replace urinary catheter prior to obtaining the urine culture  if it has been in place for greater than or equal to 14  days:->N/A No Foley  Indications for U/A Reflex to Micro - Reflex to  Culture:->Suprapubic Pain/Tenderness or Dysuria    Hemoglobin A1C [[id number]] Collected:  12/30/17 0726    Specimen:  Blood Updated:  12/30/17 1555     Hemoglobin A1C 4.8 %      Average Estimated Glucose 91.1 mg/dL     Lipid panel [[id number]] Collected:  12/30/17 0726    Specimen:  Blood Updated:  12/30/17 1554     Cholesterol 157 mg/dL      Triglycerides 88 mg/dL      HDL 58 mg/dL      LDL Calculated 81 mg/dL      VLDL Cholesterol Cal 18 mg/dL      CHOL/HDL Ratio 2.7    Hemolysis index [[id number]] Collected:  12/30/17 0726     Updated:  12/30/17 1554     Hemolysis Index 4        No results found for any visits on 12/29/17.  Cardiology Results     None          Consults:     Consult Orders  None        None.    Discharge Day Evaluation     Blood pressure 106/71, pulse 83, temperature 97.7 F (36.5 C), temperature source Oral, resp. rate 16, height 1.905 m (6\' 3" ), weight 109 kg (240 lb 4.8 oz), SpO2 97 %.    Mental Status Exam    General appearance: Appears chronological age, Good hygiene and Good grooming  Attitude/Behavior: Calm and Cooperative  Motor: No abnormalities noted  Gait: No obvious abnormalities  Muscle strength and tone: Grossly intact  Speech:   Spontaneous: Yes  Volume: Normal  Mood: euthymic  Affect:   Stability: Stable  Appropriateness to thought content: Yes  Thought Process:   Coherent: Yes  Associations: Goal-directed  Thought Content:   Suicidal:  No suicidal thoughts  Homicidal:  No homicidal thoughts  Perceptions:   Hallucinations: No  Insight: Fair  Judgment: Fair  Cognition:       Orientation: Intact to self, place and time    Review of Systems  A complete 14 point ROS was done and was negative  except for  Psychiatric: Depression  Constitutional: No complaints    Hospital Course:   Patient is a 57 year old African American male with history of Bipolar disorder admitted for suicidal thoughts with a plan to kill himself with his friend's gun. Patient ran out of his Abilify and vistaril medications and started binge drinking alcohol for couple of days prior to his admission. Patient has a history of multiple psychiatric admissions. At the time of admission, patient presented with hopelessness stating he had nothing to live for and was actively having withdrawal symptoms secondary to his alcohol use. He also endorsed auditory hallucinations telling him 'I have nothing to live for". His LFTs were elevated. Patient had an abnormal UA, which was indicative for UTI. He was placed on Macrobid and efficacy was acheived.   Patient was started back on his outpatient medication - Abilify 20 mg po daily and vistaril 50 mg HS. He was also started on naltrexone 50 mg daily, gabapentin 300mg  po TID and placed on CIWA protocol with ativan.  Patient reported improvement with his sleep and withdrawal symptoms. Auditory hallucinations was diminished in intensity, but still voiced feeling depressed and anxious. Abilify was titrated up to 30 mg to target depressive and psychotic symptoms. Discussed adding lithium, Lamictal or SSRI  with patient to target depressive symptoms, but patient reported a history of manic episode while started on prozac. He also reported overdosing on lithium in the past and developing rash while on Lamictal. Buspar 10mg  po BID was attempted as an adjunct. Patient was initially compliant with all treatment recommendations, but he later refused buspar and gabapentin stating he does not feel like he needs them. Patient had minimal attendance in group, but was observed interacting with a few selected peers. A day leading up to his discharge, patient's affect was much brighter with improved mood. He was  more hopeful and goal directed expressing interest in going to a rehab program in [place] to prevent relapse in alcohol use.  His LFTs was within normal limit and he was withdrawal free. Patient was discharged to [facility] with follow up appointment at [facility] in [place] for outpatient services.     Suicidal and Homicidal Status on Discharge:   Patient denies suicidal and homicidal ideation, intent and plan.    Discharge Instructions:   Discharge Disposition: [facility] Outpatient psychiatry  Discharge Instructions given to: Patient    Questions that may arise between hospital discharge and your first follow-up appointment should be directed to [facility]: [phone]; 911 or the closest emergency department    Case discussion was held between inpatient treatment team and outpatient provider(s).  Discharge Plan:     Follow-up Information     Pcp, None, MD .                 Attestation:   The patient has been seen and evaluated by me,  Napoleon Form, NP    Patient discharged on more than 1 antipsychotic medication? No  On discharge, was the patient on any psychotropic medication off label? No  I spent more than 30 minutes coordinating the discharge and reviewing the discharge plan.    Discharge Medications:     Tobacco Cessation Discharge Prescription for Cessation Medication  Patient was assessed on admission and found not to be a tobacco user.         Discharge Medication List      Taking    ARIPiprazole 30 MG tablet  Dose:  30 mg  What changed:   medication strength   how much to take   when to take this  Commonly known as:  ABILIFY  For:  Bipolar disorder  Take 1 tablet (30 mg total) by mouth nightly     folic acid 1 MG tablet  Dose:  1 mg  Commonly known as:  FOLVITE  For:  Anemia From Inadequate Folic Acid  Start taking on:  01/06/2018  Take 1 tablet (1 mg total) by mouth daily     hydrocortisone 1 % cream  Commonly  known as:  CORTAID  For:  Inflammation  Apply topically 2 (two) times daily     hydrOXYzine 25 MG capsule  Dose:  25 mg  What changed:   medication strength   how much to take   when to take this   reasons to take this  Commonly known as:  VISTARIL  For:  Feeling Anxious  Take 1 capsule (25 mg total) by mouth 2 (two) times daily as needed for Itching or Anxiety     ibuprofen 400 MG tablet  Dose:  800 mg  Commonly known as:  ADVIL,MOTRIN  For:  Rheumatoid Arthritis  Take 2 tablets (800 mg total) by mouth 2 (two) times daily as needed for Pain     multivitamin Tabs  Dose:  1 tablet  For:  supplements  Start taking on:  01/06/2018  Take 1 tablet by mouth daily     naltrexone 50 MG tablet  Dose:  50 mg  Commonly known as:  REVIA  For:  Excessive Use of Alcohol  Start taking on:  01/06/2018  Take 1 tablet (50 mg total) by mouth daily     nystatin powder  Commonly known as:  NYSTOP  For:  Skin Infection due to Candida Yeast  Apply topically 2 (two) times daily     thiamine 100 MG tablet  Dose:  100 mg  Commonly known as:  B-1  For:  Deficiency of Vitamin B1  Start taking on:  01/06/2018  Take 1 tablet (100 mg total) by mouth daily        STOP taking these medications    nitrofurantoin (macrocrystal-monohydrate) 100 MG capsule  Commonly known as:  MACROBID              Signed by: Napoleon Form,  NP   01/05/2018  9:46 AM

## 2018-01-05 NOTE — Progress Notes (Signed)
[name] met w. Pt who will be d/c today. Pt will be provided with 2 weeks of psych meds.    ARIPiprazole 30 mg: $29.56  Naltraxone: $39.96    Total: $69.52    Pt will be cabbed to [facility] station.

## 2018-01-05 NOTE — Plan of Care (Signed)
Problem: Mood Disorder  Goal: Reports improved mood  Outcome: Not Progressing   01/05/18 0143   Goal/Interventions addressed this shift   Reports improved mood  Encourage the patient to verbalize feelings of anxiety, anger, and fears;Frequent brief 1:1 conversations with the patient to assess mood /coping response   Pt visible in the milieu interacting with peers. Denies thoughts of harming others and self, depression, anxiety, AVH and pain. Mood unstable, affect flat with blaming, yet cooperative attitude. Requested and received Ibuprofen 400mg  for bilateral knee pain of 4/10 with good effect. Took his night meds with no issues "Now someone has listened to me. I'm taking the right medication". Eating and hydrating well. Will continue to monitor for safety and needs.

## 2018-01-05 NOTE — Discharge Summary -  Nursing (Signed)
[name] had a regular discharge today at 1345  to his private residence. He was picked up by a cab. He received discharge instructions on his medications, upcoming appointments, and educational resources on addiction and depression. He verbalized understanding of discharge instruction. He denied SI/HI and AV/AH. He was clinically stable and no distress noted at time of discharge. Belongings were returned including nystatin powder.

## 2018-01-05 NOTE — Progress Notes (Signed)
Chaplain Service      Background:  Visit Type: Follow-up was made by Chaplain with patient, [name], based on Source: Chaplain Initiated.  Present at Visit: patient.  Spiritual Care Provided to: patient only.  Length of Visit: 16-30 minutes .    Summary:  Spiritual Care Interventions: Provided emotional support, Explored belief and/or faith issues, Provided reflective and compassionate listening, Provided comfort, encouragement,affirmation, Provided spiritual encouragement, Provided prayer, Proveded relaxation techniques, Made a positive connection with patient  Reason for Request: Spiritual Support, Emotional Support   Spiritual Care Outcomes: Increased sense of hope, Patient expressed appreciation of visit

## 2019-03-11 ENCOUNTER — Emergency Department
Admission: EM | Admit: 2019-03-11 | Discharge: 2019-03-11 | Disposition: A | Discharge status: Other Acute Inpatient Hospital | Payer: Medicaid HMO | Attending: Emergency Medicine | Admitting: Emergency Medicine

## 2019-03-11 ENCOUNTER — Telehealth (HOSPITAL_BASED_OUTPATIENT_CLINIC_OR_DEPARTMENT_OTHER): Payer: Self-pay

## 2019-03-11 ENCOUNTER — Emergency Department: Payer: Medicaid HMO

## 2019-03-11 DIAGNOSIS — Z20828 Contact with and (suspected) exposure to other viral communicable diseases: Secondary | ICD-10-CM | POA: Insufficient documentation

## 2019-03-11 DIAGNOSIS — Z008 Encounter for other general examination: Secondary | ICD-10-CM

## 2019-03-11 DIAGNOSIS — F3110 Bipolar disorder, current episode manic without psychotic features, unspecified: Secondary | ICD-10-CM

## 2019-03-11 DIAGNOSIS — N390 Urinary tract infection, site not specified: Secondary | ICD-10-CM | POA: Insufficient documentation

## 2019-03-11 DIAGNOSIS — Z79899 Other long term (current) drug therapy: Secondary | ICD-10-CM | POA: Insufficient documentation

## 2019-03-11 DIAGNOSIS — F29 Unspecified psychosis not due to a substance or known physiological condition: Secondary | ICD-10-CM | POA: Insufficient documentation

## 2019-03-11 DIAGNOSIS — F23 Brief psychotic disorder: Secondary | ICD-10-CM

## 2019-03-11 LAB — ACETAMINOPHEN LEVEL: Acetaminophen Level: <7 ug/mL — ABNORMAL LOW (ref 10–30)

## 2019-03-11 LAB — RAPID DRUG SCREEN, URINE
Barbiturate Screen, UR: NEGATIVE
Benzodiazepine Screen, UR: NEGATIVE
Cannabinoid Screen, UR: NEGATIVE
Cocaine, UR: NEGATIVE
Opiate Screen, UR: NEGATIVE
PCP Screen, UR: NEGATIVE
Urine Amphetamine Screen: NEGATIVE

## 2019-03-11 LAB — CBC AND DIFFERENTIAL
Absolute NRBC: 0 10*3/uL (ref 0.00–0.00)
Basophils Absolute Automated: 0.03 10*3/uL (ref 0.00–0.08)
Basophils Automated: 0.7 %
Eosinophils Absolute Automated: 0.51 10*3/uL — ABNORMAL HIGH (ref 0.00–0.44)
Eosinophils Automated: 12 %
Hematocrit: 43.5 % (ref 37.6–49.6)
Hgb: 13.7 g/dL (ref 12.5–17.1)
Immature Granulocytes Absolute: 0.01 10*3/uL (ref 0.00–0.07)
Immature Granulocytes: 0.2 %
Lymphocytes Absolute Automated: 1.02 10*3/uL (ref 0.42–3.22)
Lymphocytes Automated: 24.1 %
MCH: 28.9 pg (ref 25.1–33.5)
MCHC: 31.5 g/dL (ref 31.5–35.8)
MCV: 91.8 fL (ref 78.0–96.0)
MPV: 9.5 fL (ref 8.9–12.5)
Monocytes Absolute Automated: 0.38 10*3/uL (ref 0.21–0.85)
Monocytes: 9 %
Neutrophils Absolute: 2.29 10*3/uL (ref 1.10–6.33)
Neutrophils: 54 %
Nucleated RBC: 0 /100 WBC (ref 0.0–0.0)
Platelets: 205 10*3/uL (ref 142–346)
RBC: 4.74 10*6/uL (ref 4.20–5.90)
RDW: 13 % (ref 11–15)
WBC: 4.24 10*3/uL (ref 3.10–9.50)

## 2019-03-11 LAB — URINALYSIS REFLEX TO MICROSCOPIC EXAM - REFLEX TO CULTURE
Bilirubin, UA: NEGATIVE
Glucose, UA: NEGATIVE
Ketones UA: NEGATIVE
Nitrite, UA: NEGATIVE
Protein, UR: 100 — AB
Specific Gravity UA: 1.035 (ref 1.001–1.035)
Urine pH: 5 (ref 5.0–8.0)
Urobilinogen, UA: NEGATIVE mg/dL (ref 0.2–2.0)

## 2019-03-11 LAB — COMPREHENSIVE METABOLIC PANEL
ALT: 21 U/L (ref 0–55)
AST (SGOT): 24 U/L (ref 5–34)
Albumin/Globulin Ratio: 1.2 (ref 0.9–2.2)
Albumin: 3.9 g/dL (ref 3.5–5.0)
Alkaline Phosphatase: 63 U/L (ref 38–106)
Anion Gap: 10 (ref 5.0–15.0)
BUN: 15 mg/dL (ref 9.0–28.0)
Bilirubin, Total: 0.7 mg/dL (ref 0.2–1.2)
CO2: 21 mEq/L — ABNORMAL LOW (ref 22–29)
Calcium: 8.7 mg/dL (ref 8.5–10.5)
Chloride: 108 mEq/L (ref 100–111)
Creatinine: 1.1 mg/dL (ref 0.7–1.3)
Globulin: 3.2 g/dL (ref 2.0–3.6)
Glucose: 115 mg/dL — ABNORMAL HIGH (ref 70–100)
Potassium: 3.8 mEq/L (ref 3.5–5.1)
Protein, Total: 7.1 g/dL (ref 6.0–8.3)
Sodium: 139 mEq/L (ref 136–145)

## 2019-03-11 LAB — ECG 12-LEAD
Atrial Rate: 61 {beats}/min
P Axis: 55 degrees
P-R Interval: 152 ms
Q-T Interval: 418 ms
QRS Duration: 90 ms
QTC Calculation (Bezet): 420 ms
R Axis: 53 degrees
T Axis: 52 degrees
Ventricular Rate: 61 {beats}/min

## 2019-03-11 LAB — GFR: EGFR: >60

## 2019-03-11 LAB — ETHANOL: Alcohol: NOT DETECTED mg/dL

## 2019-03-11 LAB — HEMOLYSIS INDEX: Hemolysis Index: 23 — ABNORMAL HIGH (ref 0–18)

## 2019-03-11 LAB — SALICYLATE LEVEL: Salicylate Level: <5 mg/dL — ABNORMAL LOW (ref 15.0–30.0)

## 2019-03-11 LAB — TSH: TSH: 0.67 u[IU]/mL (ref 0.35–4.94)

## 2019-03-11 LAB — COVID-19 (SARS-COV-2): SARS CoV 2 Overall Result: NEGATIVE

## 2019-03-11 MED ORDER — ARIPIPRAZOLE 15 MG PO TABS
30.0000 mg | ORAL_TABLET | Freq: Once | ORAL | Status: DC
Start: 2019-03-11 — End: 2019-03-11
  Filled 2019-03-11: qty 2

## 2019-03-11 MED ORDER — IBUPROFEN 600 MG PO TABS
600.0000 mg | ORAL_TABLET | Freq: Once | ORAL | Status: AC
Start: 2019-03-11 — End: 2019-03-11
  Administered 2019-03-11: 10:00:00 600 mg via ORAL
  Filled 2019-03-11: qty 1

## 2019-03-11 MED ORDER — HYDROXYZINE HCL 10 MG PO TABS
25.00 mg | ORAL_TABLET | Freq: Once | ORAL | Status: AC
Start: 2019-03-11 — End: 2019-03-11
  Administered 2019-03-11: 10:00:00 25 mg via ORAL
  Filled 2019-03-11: qty 3

## 2019-03-11 MED ORDER — CIPROFLOXACIN HCL 500 MG PO TABS
500.00 mg | ORAL_TABLET | Freq: Once | ORAL | Status: AC
Start: 2019-03-11 — End: 2019-03-11
  Administered 2019-03-11: 16:00:00 500 mg via ORAL
  Filled 2019-03-11: qty 1

## 2019-03-11 NOTE — Progress Notes (Signed)
Report received from Conception D.

## 2019-03-11 NOTE — ED Provider Notes (Signed)
EMERGENCY DEPARTMENT HISTORY AND PHYSICAL EXAM     None        Date: 03/11/2019  Patient Name: [name]  Attending Physician: Dereck Leep, MD  Advanced Practice Provider: Volanda Napoleon    History of Presenting Illness       History Provided By: Patient    Chief Complaint:  Chief Complaint   Patient presents with    Mental Health Problem         Additional History: [name] is a 58 y.o. male with hx of depression and SI presenting to the ED for SI. Pt reports he has not taken his medications for the past few weeks because he is staying in [place] and he ran out of his meds. He reports he feels like cutting himself and he is hearing voices telling him that he should die. He does not have any specific plan. He denies any HI or visual hallucinations. Pt also reports hx of balanitis and c/o irritation on his foreskin currently with mild dysuria. He would like his urine checked. He is also c/o nasal congestion and dry cough. He denies any fever, chills, SOB, or COVID contacts. Pt is also requesting Ibuprofen for arthritis pain, otherwise he is asymptomatic. He is normally on Latuda and Hydroxyzine.       PCP: Pcp, None, MD  SPECIALISTS:    No current facility-administered medications for this encounter.      Current Outpatient Medications   Medication Sig Dispense Refill    LURASIDONE HCL PO Take by mouth      ARIPiprazole (ABILIFY) 30 MG tablet Take 1 tablet (30 mg total) by mouth nightly 14 tablet 0    folic acid (FOLVITE) 1 MG tablet Take 1 tablet (1 mg total) by mouth daily 14 tablet 0    hydrocortisone (CORTAID) 1 % cream Apply topically 2 (two) times daily 28 g 0    hydrOXYzine (ATARAX) 25 MG tablet TAKE ONE(1) Tablet BY MOUTH 3 TIMES DAILY  1    hydrOXYzine (VISTARIL) 25 MG capsule Take 1 capsule (25 mg total) by mouth 2 (two) times daily as needed for Itching or Anxiety 28 capsule 0    ibuprofen (ADVIL,MOTRIN) 400 MG tablet Take 2 tablets (800 mg total) by mouth 2 (two)  times daily as needed for Pain 28 tablet 0    multivitamin (MULTIVITAMIN) Tab Take 1 tablet by mouth daily 14 tablet 0    naltrexone (REVIA) 50 MG tablet Take 1 tablet (50 mg total) by mouth daily 14 tablet 0    nystatin (NYSTOP) powder Apply topically 2 (two) times daily 28 g 0    thiamine (B-1) 100 MG tablet Take 1 tablet (100 mg total) by mouth daily 14 tablet 0       At the time of evaluation in the ED, the patient does not meet criteria for COVID 19 testing.     PPE: goggles, N95 or respirator, surgical cap, gloves  Past History     Past Medical History:  Past Medical History:   Diagnosis Date    Arthritis     Bipolar disorder     PTSD (post-traumatic stress disorder)        Past Surgical History:  History reviewed. No pertinent surgical history.    Family History:  History reviewed. No pertinent family history.    Social History:  Social History     Tobacco Use    Smoking status: Never Smoker    Smokeless tobacco:  Never Used    Tobacco comment: Pt denies tobacco use in the last 30 days   Substance Use Topics    Alcohol use: Yes     Alcohol/week: 14.0 standard drinks     Types: 6 Cans of beer, 6 Glasses of wine, 2 Shots of liquor per week     Comment: Pt admits to drinking alcohol in the last 12 months.    Drug use: Yes     Types: Cocaine     Comment: Pt admits to cocaine use in the last 12 months       Allergies:  Allergies   Allergen Reactions    Invega [Paliperidone]     Lamictal [Lamotrigine]     Penicillins        Review of Systems     Review of Systems   Constitutional: Negative for chills and fever.   HENT: Positive for congestion.    Respiratory: Positive for cough. Negative for shortness of breath.    Cardiovascular: Negative for chest pain.   Gastrointestinal: Negative for abdominal pain, diarrhea, nausea and vomiting.   Genitourinary: Positive for dysuria and penile pain.   Musculoskeletal: Negative for back pain.   Skin: Negative for rash.   Neurological: Negative for syncope.    Psychiatric/Behavioral: Positive for hallucinations (auditory) and suicidal ideas.       Physical Exam     Vitals:    03/11/19 0814 03/11/19 1209 03/11/19 1710 03/11/19 1859   BP:  112/70 115/70 109/69   Pulse: 76 64 75 69   Resp:  16 15 18    Temp:  97.9 F (36.6 C) 98.4 F (36.9 C) 98.6 F (37 C)   TempSrc:  Oral Oral Oral   SpO2:  100% 98% 98%   Weight:       Height:           Physical Exam   Constitutional: He is oriented to person, place, and time. He appears well-developed and well-nourished. No distress.   HENT:   Head: Normocephalic and atraumatic.   Mouth/Throat: Oropharynx is clear and moist.   Nasal congestion   Eyes: EOM are normal.   Neck: Normal range of motion. Neck supple.   Cardiovascular: Normal rate, regular rhythm and intact distal pulses.   2+ radial and DP pulses   Pulmonary/Chest: Effort normal and breath sounds normal. No respiratory distress. He has no wheezes. He has no rales.   Abdominal: Soft. He exhibits no distension. There is no abdominal tenderness.   Genitourinary:    Genitourinary Comments: Slight irritation around foreskin, easily retracted, no discharge, no other lesions, normal testicles     Musculoskeletal: Normal range of motion.         General: No deformity.   Neurological: He is alert and oriented to person, place, and time. Coordination normal.   Sensation intact to light touch   Skin: Skin is warm and dry. No rash noted.   Psychiatric: He has a normal mood and affect.       Diagnostic Study Results     Labs -     Results     Procedure Component Value Units Date/Time    Urine culture [[id number]] Collected: 03/11/19 0857    Specimen: Urine, Clean Catch Updated: 03/11/19 1642    Narrative:      Replace urinary catheter prior to obtaining the urine culture  if it has been in place for greater than or equal to 14  days:->No  Indications for Urine Culture:->Suprapubic Pain/Tenderness  or  Dysuria    UA Reflex to Micro - Reflex to Culture [[id number]]  (Abnormal) Collected:  03/11/19 0857    Specimen: Urine, Clean Catch Updated: 03/11/19 1012     Urine Type Urine, Clean Ca     Color, UA Yellow     Clarity, UA Sl Cloudy     Specific Gravity UA 1.035     Urine pH 5.0     Leukocyte Esterase, UA Moderate     Nitrite, UA Negative     Protein, UR 100     Glucose, UA Negative     Ketones UA Negative     Urobilinogen, UA Negative mg/dL      Bilirubin, UA Negative     Blood, UA Small     RBC, UA 11 - 25 /hpf      WBC, UA 6 - 10 /hpf      Squamous Epithelial Cells, Urine 0 - 5 /hpf      Urine Mucus Present    Narrative:      Replace urinary catheter prior to obtaining the urine culture  if it has been in place for greater than or equal to 14  days:->N/A No Foley  Indications for U/A Reflex to Micro - Reflex to  Culture:->Suprapubic Pain/Tenderness or Dysuria    COVID-19 (SARS-COV-2)  ([place] Rapid)- Medically necessary procedure/surgery within 24 hours (no isolation) [[id number]] Collected: 03/11/19 0948    Specimen: Nasopharyngeal Swab from Nasopharynx Updated: 03/11/19 1012     Purpose of COVID testing Screening     SARS-CoV-2 Specimen Source Nasopharyngeal     SARS CoV 2 Overall Result Negative    Narrative:      o Collect and clearly label specimen type:  o Upper respiratory specimen: One Nasopharyngeal Dry Swab NO  Transport Media.  o Hand deliver to laboratory ASAP  Indication for testing->Medically necessary procedure/surgery  within 24 hours    TSH [[id number]] Collected: 03/11/19 0857    Specimen: Blood Updated: 03/11/19 0946     TSH 0.67 uIU/mL     Urine Rapid Drug Screen [[id number]] Collected: 03/11/19 0857    Specimen: Urine Updated: 03/11/19 3500     Urine Amphetamine Screen Negative     Barbiturate Screen, UR Negative     Benzodiazepine Screen, UR Negative     Cannabinoid Screen, UR Negative     Cocaine, UR Negative     Opiate Screen, UR Negative     PCP Screen, UR Negative    Hemolysis index [[id number]]  (Abnormal) Collected: 03/11/19 0857     Updated: 03/11/19 0927     Hemolysis  Index 23    GFR [[id number]] Collected: 03/11/19 0857     Updated: 03/11/19 0927     EGFR >60.0    Comprehensive metabolic panel [[id number]]  (Abnormal) Collected: 03/11/19 0857    Specimen: Blood Updated: 03/11/19 0927     Glucose 115 mg/dL      BUN 58.5 mg/dL      Creatinine 1.1 mg/dL      Sodium 277 mEq/L      Potassium 3.8 mEq/L      Chloride 108 mEq/L      CO2 21 mEq/L      Calcium 8.7 mg/dL      Protein, Total 7.1 g/dL      Albumin 3.9 g/dL      AST (SGOT) 24 U/L      ALT 21 U/L      Alkaline Phosphatase 63 U/L  Bilirubin, Total 0.7 mg/dL      Globulin 3.2 g/dL      Albumin/Globulin Ratio 1.2     Anion Gap 10.0    Ethanol (Alcohol) Level [[id number]] Collected: 03/11/19 0857    Specimen: Blood Updated: 03/11/19 0927     Alcohol None Detected mg/dL     Acetaminophen level [[id number]]  (Abnormal) Collected: 03/11/19 0857    Specimen: Blood Updated: 03/11/19 0927     Acetaminophen Level <7 ug/mL     Salicylate level [[id number]]  (Abnormal) Collected: 03/11/19 0857    Specimen: Blood Updated: 03/11/19 0927     Salicylate Level <5.0 mg/dL     CBC and differential [[id number]]  (Abnormal) Collected: 03/11/19 0857    Specimen: Blood Updated: 03/11/19 0910     WBC 4.24 x10 3/uL      Hgb 13.7 g/dL      Hematocrit 62.9 %      Platelets 205 x10 3/uL      RBC 4.74 x10 6/uL      MCV 91.8 fL      MCH 28.9 pg      MCHC 31.5 g/dL      RDW 13 %      MPV 9.5 fL      Neutrophils 54.0 %      Lymphocytes Automated 24.1 %      Monocytes 9.0 %      Eosinophils Automated 12.0 %      Basophils Automated 0.7 %      Immature Granulocytes 0.2 %      Nucleated RBC 0.0 /100 WBC      Neutrophils Absolute 2.29 x10 3/uL      Lymphocytes Absolute Automated 1.02 x10 3/uL      Monocytes Absolute Automated 0.38 x10 3/uL      Eosinophils Absolute Automated 0.51 x10 3/uL      Basophils Absolute Automated 0.03 x10 3/uL      Immature Granulocytes Absolute 0.01 x10 3/uL      Absolute NRBC 0.00 x10 3/uL           Radiologic Studies -   Radiology  Results (24 Hour)     Procedure Component Value Units Date/Time    XR Chest  AP Portable [[id number]] Collected: 03/11/19 0946    Order Status: Completed Updated: 03/11/19 0949    Narrative:      CLINICAL INDICATION: Cough    COMPARISON: None available    INTERPRETATION: Portable frontal views of the chest obtained.  Cardiomediastinal contour within normal limits for technique. Clear  lungs and sharp sulci.      Impression:       No acute cardiopulmonary process.    Filbert Schilder, MD   03/11/2019 9:47 AM      .    Medical Decision Making   I am the first provider for this patient.    I reviewed the vital signs, available nursing notes, past medical history, past surgical history, family history and social history.    Vital Signs-Reviewed the patient's vital signs.     Patient Vitals for the past 12 hrs:   BP Temp Pulse Resp   03/11/19 1859 109/69 98.6 F (37 C) 69 18   03/11/19 1710 115/70 98.4 F (36.9 C) 75 15   03/11/19 1209 112/70 97.9 F (36.6 C) 64 16   03/11/19 0814   76    03/11/19 0806 106/75 98.1 F (36.7 C)  20       Pulse Oximetry Analysis -  Normal SpO2: 98 % on RA    Procedures:         Old Medical Records: Old medical records.       ED Medications  Medications   hydrOXYzine (ATARAX) tablet 25 mg (25 mg Oral Given 03/11/19 0944)   ibuprofen (ADVIL) tablet 600 mg (600 mg Oral Given 03/11/19 0944)   ciprofloxacin (CIPRO) tablet 500 mg (500 mg Oral Given 03/11/19 1619)       ED Course:   ED Course as of Mar 11 1999   Fri Mar 11, 2019   1034 Called psych liaison, no answer, left message.    [SS]   1200 Psych liaison called, unable to answer due to being in procedure, was told we can call them back.    [SS]   1227 Called psych liaison again for update, no answer.    [SS]   1236 Discussed with psych liaison, Osborne Casco, who agrees to consult.     [SS]   1301 Pt sleeping.    [SS]      ED Course User Index  [SS] Renda Rolls     Patient evaluated by central access, will admit to [facility].     Patient was tested  for COVID 19. (if tested, D-dimer, ferritin, CRP, EKG, contact/droplet isolation orders).    Provider Notes:     UTI on UA, patient reports he has used cipro before without issue and requests the same. Slight irritation around foreskin but with otherwise normal exam. Nasal congestion- neg covid and cxr clear. Patient is medically cleared and will be admitted to [facility].      D/w Dereck Leep, MD      Diagnosis     Clinical Impression:   1. Psychotic episode    2. Acute UTI        Treatment Plan:   ED Disposition     ED Disposition Condition Date/Time Comment    Transfer to Another Facility  Fri Mar 11, 2019  5:20 PM [name] should be transferred out to [facility].            _______________________________    CHART OWNERSHIPAnastasia Pall, PA-C, am the primary clinician of record.  _______________________________       Volanda Napoleon, PA  03/11/19 2001       Dereck Leep, MD  03/13/19 [phone]

## 2019-03-11 NOTE — ED Notes (Signed)
[facility] for report was told to wait and call back in 10 min

## 2019-03-11 NOTE — ED Triage Notes (Signed)
Patient presents to the ED with SI. Patient states he is from [place], [place] but working in [place]  and ran out of his Mental Health medications. No meds x 2 weeks, feeling SI and manic. Hearing voices that are saying negative things such as the eorld is coming to end and he should just end it . Denies any HI.
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
## 2019-03-11 NOTE — ED Notes (Signed)
Called [facility] x 3 informed Diplomatic Services operational officer that I was off going nurse unable to wait longer to give report and I had already called three times for report. Told that they would return the call when able no ETA given

## 2019-03-11 NOTE — ED Notes (Signed)
Called [facility] once again to give report was told Charge RN was currently busy and was unable to receive report, that they would call back when ready

## 2019-03-12 ENCOUNTER — Inpatient Hospital Stay
Admission: AD | Admit: 2019-03-12 | Discharge: 2019-03-21 | DRG: 750 | Disposition: A | Discharge status: Home / Self Care | Payer: Medicaid Managed Care Other | Source: Other Acute Inpatient Hospital | Attending: Student in an Organized Health Care Education/Training Program | Admitting: Student in an Organized Health Care Education/Training Program

## 2019-03-12 DIAGNOSIS — R8281 Pyuria: Secondary | ICD-10-CM | POA: Diagnosis present

## 2019-03-12 DIAGNOSIS — F1010 Alcohol abuse, uncomplicated: Secondary | ICD-10-CM | POA: Diagnosis present

## 2019-03-12 DIAGNOSIS — F251 Schizoaffective disorder, depressive type: Principal | ICD-10-CM | POA: Diagnosis present

## 2019-03-12 DIAGNOSIS — N481 Balanitis: Secondary | ICD-10-CM | POA: Diagnosis present

## 2019-03-12 DIAGNOSIS — Z9114 Patient's other noncompliance with medication regimen: Secondary | ICD-10-CM

## 2019-03-12 DIAGNOSIS — Z599 Problem related to housing and economic circumstances, unspecified: Secondary | ICD-10-CM

## 2019-03-12 DIAGNOSIS — Z20828 Contact with and (suspected) exposure to other viral communicable diseases: Secondary | ICD-10-CM | POA: Diagnosis present

## 2019-03-12 DIAGNOSIS — Z79899 Other long term (current) drug therapy: Secondary | ICD-10-CM

## 2019-03-12 DIAGNOSIS — F4310 Post-traumatic stress disorder, unspecified: Secondary | ICD-10-CM | POA: Diagnosis present

## 2019-03-12 DIAGNOSIS — R45851 Suicidal ideations: Secondary | ICD-10-CM

## 2019-03-12 LAB — CBC AND DIFFERENTIAL
Absolute NRBC: 0 10*3/uL (ref 0.00–0.00)
Basophils Absolute Automated: 0.04 10*3/uL (ref 0.00–0.08)
Basophils Automated: 0.9 %
Eosinophils Absolute Automated: 0.6 10*3/uL — ABNORMAL HIGH (ref 0.00–0.44)
Eosinophils Automated: 13.1 %
Hematocrit: 44.3 % (ref 37.6–49.6)
Hgb: 14 g/dL (ref 12.5–17.1)
Immature Granulocytes Absolute: 0.01 10*3/uL (ref 0.00–0.07)
Immature Granulocytes: 0.2 %
Lymphocytes Absolute Automated: 1.45 10*3/uL (ref 0.42–3.22)
Lymphocytes Automated: 31.7 %
MCH: 28.8 pg (ref 25.1–33.5)
MCHC: 31.6 g/dL (ref 31.5–35.8)
MCV: 91.2 fL (ref 78.0–96.0)
MPV: 9.8 fL (ref 8.9–12.5)
Monocytes Absolute Automated: 0.4 10*3/uL (ref 0.21–0.85)
Monocytes: 8.7 %
Neutrophils Absolute: 2.08 10*3/uL (ref 1.10–6.33)
Neutrophils: 45.4 %
Nucleated RBC: 0 /100 WBC (ref 0.0–0.0)
Platelets: 204 10*3/uL (ref 142–346)
RBC: 4.86 10*6/uL (ref 4.20–5.90)
RDW: 13 % (ref 11–15)
WBC: 4.58 10*3/uL (ref 3.10–9.50)

## 2019-03-12 LAB — LIPID PANEL
Cholesterol / HDL Ratio: 3.2
Cholesterol: 152 mg/dL (ref 0–199)
HDL: 47 mg/dL (ref 40–9999)
LDL Calculated: 79 mg/dL (ref 0–99)
Triglycerides: 131 mg/dL (ref 34–149)
VLDL Calculated: 26 mg/dL (ref 10–40)

## 2019-03-12 LAB — HEMOLYSIS INDEX: Hemolysis Index: 4 (ref 0–18)

## 2019-03-12 MED ORDER — CIPROFLOXACIN HCL 250 MG PO TABS
250.0000 mg | ORAL_TABLET | Freq: Two times a day (BID) | ORAL | Status: DC
Start: 2019-03-12 — End: 2019-03-13
  Administered 2019-03-12 (×2): 250 mg via ORAL
  Filled 2019-03-12 (×4): qty 1

## 2019-03-12 MED ORDER — ALUM & MAG HYDROXIDE-SIMETH 200-200-20 MG/5ML PO SUSP
30.00 mL | Freq: Four times a day (QID) | ORAL | Status: DC | PRN
Start: 2019-03-12 — End: 2019-03-21

## 2019-03-12 MED ORDER — NYSTATIN 100000 UNIT/GM EX CREA
TOPICAL_CREAM | Freq: Two times a day (BID) | CUTANEOUS | Status: DC
Start: 2019-03-12 — End: 2019-03-21
  Filled 2019-03-12: qty 15

## 2019-03-12 MED ORDER — HYDROXYZINE PAMOATE 25 MG PO CAPS
50.00 mg | ORAL_CAPSULE | Freq: Every evening | ORAL | Status: DC
Start: 2019-03-12 — End: 2019-03-21
  Administered 2019-03-12 – 2019-03-20 (×9): 50 mg via ORAL
  Filled 2019-03-12 (×9): qty 2

## 2019-03-12 MED ORDER — DOCUSATE SODIUM 100 MG PO CAPS
100.00 mg | ORAL_CAPSULE | Freq: Two times a day (BID) | ORAL | Status: DC | PRN
Start: 2019-03-12 — End: 2019-03-21

## 2019-03-12 MED ORDER — DIPHENHYDRAMINE HCL 25 MG PO CAPS
50.00 mg | ORAL_CAPSULE | Freq: Once | ORAL | Status: DC | PRN
Start: 2019-03-12 — End: 2019-03-21

## 2019-03-12 MED ORDER — DIPHENHYDRAMINE HCL 50 MG/ML IJ SOLN
50.00 mg | Freq: Once | INTRAMUSCULAR | Status: DC | PRN
Start: 2019-03-12 — End: 2019-03-21

## 2019-03-12 MED ORDER — HYDROXYZINE PAMOATE 25 MG PO CAPS
25.00 mg | ORAL_CAPSULE | ORAL | Status: DC | PRN
Start: 2019-03-12 — End: 2019-03-21
  Administered 2019-03-12 – 2019-03-17 (×3): 25 mg via ORAL
  Filled 2019-03-12 (×3): qty 1

## 2019-03-12 MED ORDER — HYDROCORTISONE 1 % EX CREA
TOPICAL_CREAM | Freq: Two times a day (BID) | CUTANEOUS | Status: AC
Start: 2019-03-12 — End: 2019-03-19
  Filled 2019-03-12: qty 28.4

## 2019-03-12 MED ORDER — QUETIAPINE FUMARATE 25 MG PO TABS
50.0000 mg | ORAL_TABLET | Freq: Every evening | ORAL | Status: DC | PRN
Start: 2019-03-12 — End: 2019-03-21
  Filled 2019-03-12: qty 2

## 2019-03-12 MED ORDER — IBUPROFEN 400 MG PO TABS
400.0000 mg | ORAL_TABLET | Freq: Four times a day (QID) | ORAL | Status: DC | PRN
Start: 2019-03-12 — End: 2019-03-12
  Administered 2019-03-12: 02:00:00 400 mg via ORAL
  Filled 2019-03-12: qty 1

## 2019-03-12 MED ORDER — ARIPIPRAZOLE 5 MG PO TABS
5.0000 mg | ORAL_TABLET | Freq: Every day | ORAL | Status: DC
Start: 2019-03-12 — End: 2019-03-14
  Administered 2019-03-12 – 2019-03-14 (×3): 5 mg via ORAL
  Filled 2019-03-12 (×3): qty 1

## 2019-03-12 MED ORDER — RISAQUAD PO CAPS
1.00 | ORAL_CAPSULE | Freq: Every day | ORAL | Status: DC
Start: 2019-03-12 — End: 2019-03-21
  Administered 2019-03-12 – 2019-03-17 (×3): 1 via ORAL
  Filled 2019-03-12 (×11): qty 1

## 2019-03-12 MED ORDER — ONDANSETRON HCL 8 MG PO TABS
4.0000 mg | ORAL_TABLET | Freq: Three times a day (TID) | ORAL | Status: DC | PRN
Start: 2019-03-12 — End: 2019-03-21

## 2019-03-12 MED ORDER — CLOTRIMAZOLE 1 % EX CREA
TOPICAL_CREAM | Freq: Two times a day (BID) | CUTANEOUS | Status: DC
Start: 2019-03-12 — End: 2019-03-21
  Filled 2019-03-12 (×2): qty 14.17

## 2019-03-12 MED ORDER — LOPERAMIDE HCL 2 MG PO CAPS
2.00 mg | ORAL_CAPSULE | ORAL | Status: DC | PRN
Start: 2019-03-12 — End: 2019-03-21

## 2019-03-12 MED ORDER — IBUPROFEN 400 MG PO TABS
400.0000 mg | ORAL_TABLET | Freq: Four times a day (QID) | ORAL | Status: DC | PRN
Start: 2019-03-12 — End: 2019-03-21
  Administered 2019-03-12 – 2019-03-21 (×20): 400 mg via ORAL
  Filled 2019-03-12 (×20): qty 1

## 2019-03-12 MED ORDER — OLANZAPINE 10 MG PO TABS
10.0000 mg | ORAL_TABLET | Freq: Two times a day (BID) | ORAL | Status: DC | PRN
Start: 2019-03-12 — End: 2019-03-21

## 2019-03-12 NOTE — Plan of Care (Signed)
Problem: Mood Disorder  Goal: Reports improved mood  Outcome: Not Progressing  Assumed patient's care at 0730, he was taking a shower.  Patient's affect is blunted, his mood is depressed with a positive attitude. Patient denied SI/HI/AH/VH. He reported "generalized joint pain" at  1242. Patient requested and was given motrin 400 mg po prn with report of positive effect at 1350. Patient eats, hydrates well, he is med compliant, he attended groups, he is visible in the milieu interacting with selected peers and he periodically watched TV. Patient maintains safety and no distress noted. Staff will continue to offer support as needed.
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
## 2019-03-12 NOTE — Consults (Signed)
Consults   MEDICINE NEW CONSULT  [facility], DIVISION OF HOSPITALIST MEDICINE   [place]   [place] Pager: [phone]      Date Time: 03/12/19 4:46 PM  Patient Name: Kerlin,Jarod Roxy Manns  Requesting Physician: Jyl Heinz, MD  Consulting Physician: Randal Buba, MD    Primary Care Physician: Pcp, None, MD    Reason for Consultation: Evaluation and management of any medical problems for patient admitted to inpatient psychiatric unit      Assessment:     Active Hospital Problems    Diagnosis    Suicidal ideation    Pyuria    Balanitis     Recommendations:   Pyuria--rule out UTI  -Continue ciprofloxacin twice daily for now    Balanitis--patient does report recurrent problem and has seen urologist previously for such but did not want to pursue recommended circumcision; patient reports that he has had good response with steroid cream and antifungal previously  -Clotrimazole cream twice a day for 14 days and hydrocortisone 1% cream twice a day for 1 week    Bipolar disorder--management as per primary service/psychiatry    Pt may participate fully with psychiatric treatment, no contraindications identified.     Jyl Heinz, MD, thank you for this consultation.  We will follow the patient with you during this hospitalization.  Please contact me with any questions or new issues.      History of Presenting Illness:   Broxton Broady is a 58 y.o. male with past medical history including PTSD and bipolar disorder who is admitted to the inpatient psychiatric unit for further care.  The patient does report a history of balanitis and reports symptoms of such currently.  The patient states that he has seen a urologist previously outpatient and was recommended for circumcision but declined such.  The patient reports he has had good response previously with antifungal cream and steroid cream for his balanitis.  The patient additionally is concerned about urinary tract infection  reports some dysuria.  The patient denies any sexual relations for over 6 months.  The patient denies any history of diabetes, hypertension, heart or lung disease.  Patient denies any chest pain or shortness of breath.  Patient patient reports fungal infection of his right foot for which she has been using a nystatin cream and has noted improving results.    Past Medical History:     Past Medical History:   Diagnosis Date    Arthritis     Bipolar disorder     PTSD (post-traumatic stress disorder)        Available old records reviewed, including: EPIC      Past Surgical History:   History reviewed. No pertinent surgical history.    Family History:     Family History   Problem Relation Age of Onset    Diabetes Neg Hx     Hypertension Neg Hx        Social History:    reports that he has never smoked. He has never used smokeless tobacco. He reports that he does not drink alcohol or use drugs.    Allergies:     Allergies   Allergen Reactions    Invega [Paliperidone]     Lamictal [Lamotrigine]     Penicillins        Medications:     Medications Prior to Admission   Medication Sig    ARIPiprazole (ABILIFY) 30 MG tablet Take 1 tablet (30 mg total)  by mouth nightly    folic acid (FOLVITE) 1 MG tablet Take 1 tablet (1 mg total) by mouth daily    hydrocortisone (CORTAID) 1 % cream Apply topically 2 (two) times daily    hydrOXYzine (ATARAX) 25 MG tablet TAKE ONE(1) Tablet BY MOUTH 3 TIMES DAILY    hydrOXYzine (VISTARIL) 25 MG capsule Take 1 capsule (25 mg total) by mouth 2 (two) times daily as needed for Itching or Anxiety    ibuprofen (ADVIL,MOTRIN) 400 MG tablet Take 2 tablets (800 mg total) by mouth 2 (two) times daily as needed for Pain    LURASIDONE HCL PO Take by mouth    multivitamin (MULTIVITAMIN) Tab Take 1 tablet by mouth daily    naltrexone (REVIA) 50 MG tablet Take 1 tablet (50 mg total) by mouth daily    nystatin (NYSTOP) powder Apply topically 2 (two) times daily    thiamine (B-1) 100 MG tablet  Take 1 tablet (100 mg total) by mouth daily       Current Facility-Administered Medications   Medication Dose Route Frequency    ARIPiprazole  5 mg Oral Daily    ciprofloxacin  250 mg Oral Q12H SCH    hydrOXYzine  50 mg Oral QHS    lactobacillus/streptococcus  1 capsule Oral Daily            Review of Systems:   All other systems were reviewed and are negative except as per the HPI    Physical Exam:     Patient Vitals for the past 24 hrs:   BP Temp Temp src Pulse Resp SpO2 Height Weight   03/12/19 0731 116/73 97.3 F (36.3 C) Oral 66 16 99 % -- --   03/12/19 0119 105/71 98.2 F (36.8 C) Oral 75 18 98 % 1.905 m (6\' 3" ) 113.4 kg (249 lb 14.4 oz)     Body mass index is 31.24 kg/m.  No intake or output data in the 24 hours ending 03/12/19 1646    General: awake, alert, no acute distress.  HEENT: eomi, sclera anicteric, oropharynx clear without lesions, mucous membranes moist  Neck: supple, no JVD  Cardiovascular: Normal S1 and S2, no gallop or rub  Lungs: clear to auscultation bilaterally without wheezing, rhonchi, or rales,: Breathing comfortably with no use of accessory muscles  Abdomen: soft, non-tender, non-distended, positive bowel sounds  GU: Uncircumcised penis with very minimal redness of glands, no discharge appreciated  Extremities: no cyanosis, no pedal edema, mild area of discoloration of medial right foot with some hyperkeratosis  Neuro: A+O x 3, cranial nerves 2 through 12 grossly intact, strength 5/5 in upper and lower extremities  Skin: Mild area of discoloration on medial right foot with some hyperkeratosis      Labs:     Recent Labs     03/12/19  0713 03/11/19  0857   WBC 4.58 4.24   Hgb 14.0 13.7   Hematocrit 44.3 43.5   Platelets 204 205       Recent Labs     03/11/19  0857   Sodium 139   Potassium 3.8   Chloride 108   CO2 21*   BUN 15.0   Creatinine 1.1   Glucose 115*   Calcium 8.7       Recent Labs     03/11/19  0857   AST (SGOT) 24   ALT 21   Alkaline Phosphatase 63   Protein, Total 7.1    Albumin 3.9   Bilirubin, Total 0.7  No results for input(s): PTT, PT, INR in the last 72 hours.    Recent Labs   Lab 03/11/19  0857   TSH 0.67           Signed by: Randal Buba, MD    cc: Jyl Heinz, MD  Pcp, None, MD

## 2019-03-12 NOTE — Progress Notes (Signed)
Writer was informed by automated message that their office is currently closed and instructed to call back during regular business hours.    Information relayed to [facility] email.
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
## 2019-03-12 NOTE — Plan of Care (Signed)
Problem: Loss of functioning (Thought Disorder, Mood Disturbance and/or Severe Anxiety) AS EVIDENCED BY...  Goal: Identify and participate in supportive program therapies  Outcome: Progressing  Flowsheets (Taken 03/12/2019 1844)  Identify and participate in supportive program therapies:   Identify target number of therapies patient will attend daily as appropriate to functioning level   Encourage attendance and reinforce small successes in participation  Note: Therapist checked in with the patient 1:1 regarding patient's mood, goals for the day, and to educate the patent on different aspects of the day's routine and unit rules.  Patient was told the groups for the day, encouraged to reach out for 1:1's as needed, and was given unit rules, specifically emphasizing ones pertaining to the corona virus. Patient attended and participated in most groups thus far today.

## 2019-03-13 DIAGNOSIS — R8281 Pyuria: Secondary | ICD-10-CM

## 2019-03-13 DIAGNOSIS — N481 Balanitis: Secondary | ICD-10-CM

## 2019-03-13 NOTE — Plan of Care (Signed)
Problem: Pain  Goal: Pain at adequate level as identified by patient  Outcome: Progressing     Problem: Psychosocial and Spiritual Needs  Goal: Demonstrates ability to cope with hospitalization/illness  Outcome: Progressing     Problem: Loss of functioning (Thought Disorder, Mood Disturbance and/or Severe Anxiety) AS EVIDENCED BY...  Goal: Will identify long and short term goals based on individual needs and strengths  Outcome: Progressing  Goal: Will remain safe during hospitalization  Outcome: Progressing  Goal: Completes discharge safety and recovery plan  Outcome: Progressing     Problem: Thought Disorder  Goal: Verbalizes reduction in hallucinations/delusions  Outcome: Progressing     Problem: Mood Disorder  Goal: Reports improved mood  Outcome: Progressing     Problem: Severe Anxiety  Goal: Verbalizes reduced anxiety  Outcome: Progressing     Problem: Loss of functioning (Thought Disorder, Mood Disturbance and/or Severe Anxiety) AS EVIDENCED BY...  Goal: Identify and participate in supportive program therapies  Outcome: Progressing  Assumed patient's care at 0730, he was in his room resting. Patient denied SI/HI,AH/VH. He is treatment focused. Patient maintains safety and no distress noted. Staff will continue to offer support as needed.

## 2019-03-13 NOTE — Plan of Care (Signed)
Problem: Pain  Goal: Pain at adequate level as identified by patient  Outcome: Not Progressing  Flowsheets (Taken 03/13/2019 0104)  Pain at adequate level as identified by patient:   Assess pain on admission, during daily assessment and/or before any "as needed" intervention(s)   Identify patient comfort function goal   Evaluate if patient comfort function goal is met   Reassess pain within 30-60 minutes of any procedure/intervention, per Pain Assessment, Intervention, Reassessment (AIR) Cycle   Offer non-pharmacological pain management interventions  Note: [name] complained of pain to bilateral knee; requested and received Advil 400 mg prn for pain with effective result.      Problem: Thought Disorder  Goal: Verbalizes reduction in hallucinations/delusions  Outcome: Not Progressing  Flowsheets (Taken 03/13/2019 0104)  Verbalizes reduction in hallucinations/delusions:   Monitor the presence of hallucinations/delusions every shift   Reality test with patient if patient able to tolerate  Note: [name] endorsed to auditory hallucination but did not elaborate on what the voices are saying stating "they are all demons. I'm Jehovah Witness and I pray".     Problem: Loss of functioning (Thought Disorder, Mood Disturbance and/or Severe Anxiety) AS EVIDENCED BY...  Goal: Will remain safe during hospitalization  Outcome: Progressing     Problem: Mood Disorder  Goal: Reports improved mood  Outcome: Progressing  Flowsheets (Taken 03/13/2019 0104)  Reports improved mood:   Encourage the patient to verbalize feelings of anxiety, anger, and fears   Frequent brief 1:1 conversations with the patient to assess mood /coping response  Note: Currently denies depression, anxiety.      Problem: Severe Anxiety  Goal: Verbalizes reduced anxiety  Outcome: Progressing  Flowsheets (Taken 03/13/2019 0104)  Verbalizes reduced anxiety:   Assist to identify signs/symptoms of anxiety   Educate about stress reduction techniques and  healthy coping responses   Facilitate expression of feelings, fears, concerns, anxiety  Note: [name] currently denies anxiety.      Received patient in the milieu beginning of shift. Patient reported doing "well" during 1:1 interaction. [name] stated he came to the hospital because he was out of his medications. Currently denies SI/HI, VH. Visible in the milieu isolative to self. Mood is depressed, blunted affect, cooperative attitude. No behavioral issue noted at this time.  Encouraged to come to staff with any concern including safety, verbalized understanding.  Continues on q15 minutes checks for safety and needs.

## 2019-03-13 NOTE — Progress Notes (Signed)
MEDICINE PROGRESS NOTE  [facility], DIVISION OF HOSPITALIST MEDICINE    [facility]   [place] Pager: [phone]      Date Time: 03/13/19 2:38 PM  Patient Name: [name],Travis [name]  Attending Physician: Jyl Heinz, MD  Hospital Day: 2  Assessment:     Active Hospital Problems    Diagnosis    Suicidal ideation    Pyuria    Balanitis       Plan:   Balanitis--patient does report recurrent problem and has seen urologist previously for such but did not want to pursue recommended circumcision; patient reports that he has had good response with steroid cream and antifungal previously  -cont Clotrimazole cream twice a day for 14 days and hydrocortisone 1% cream twice a day for 1 week    Pyuria--suspect contaminated specimen and no infection/UTI including with urine culture with 1,000 - 9,000 CFU/ML of normal urogenital or skin microbiota  -d/c antibiotics/cipro    Suspected fungal foot rash  -cont nystatin    Bipolar disorder--management as per primary service/psychiatry    Will sign off at this time.  Please call with any questions or concerns    Subjective     CC: Suicidal ideation  HPI/Subjective: Patient seen and examined.  Patient feeling okay currently, denies any pain or shortness of breath.    Review of Systems:   Review of Systems - Negative except as above in HPI    Physical Exam:     Temp:  [97.4 F (36.3 C)-97.7 F (36.5 C)] 97.4 F (36.3 C)  Heart Rate:  [73-80] 80  Resp Rate:  [16-18] 18  BP: (118-125)/(77-78) 125/77  No intake or output data in the 24 hours ending 03/13/19 1438 General: alert and awake, no distress  HEENT: anicteric, moist mucous membranes  Neck: supple, no JVD  CVS: nl S1, S2; no gallop or rub  Lungs: clear to auscultation b/l, no wheezing or rhonchi; normal resp effort  Abd: soft, non-tender, non-distended, positive bowel sounds  Ext: no pedal edema, no cyanosis       Meds:   Medications were reviewed:  Scheduled Meds:  Current Facility-Administered  Medications   Medication Dose Route Frequency    ARIPiprazole  5 mg Oral Daily    clotrimazole   Topical BID    hydrocortisone   Topical BID    hydrOXYzine  50 mg Oral QHS    lactobacillus/streptococcus  1 capsule Oral Daily    nystatin   Topical BID     Continuous Infusions:  PRN Meds:.alum & mag hydroxide-simethicone, diphenhydrAMINE **OR** diphenhydrAMINE, docusate sodium, hydrOXYzine, ibuprofen, loperamide, OLANZapine, ondansetron, QUEtiapine  Labs/Radiology:   Imaging personally reviewed, including: all available   No results found.  No results for input(s): GLUCOSEWB in the last 24 hours.  Recent Labs   Lab 03/11/19  0857   Sodium 139   Potassium 3.8   Chloride 108   BUN 15.0   Creatinine 1.1   EGFR >60.0   Glucose 115*   Calcium 8.7     Recent Labs   Lab 03/12/19  0713 03/11/19  0857   WBC 4.58 4.24   Hgb 14.0 13.7   Hematocrit 44.3 43.5   Platelets 204 205         Recent Labs   Lab 03/11/19  0857   Alkaline Phosphatase 63   Bilirubin, Total 0.7   ALT 21   AST (SGOT) 24       This note was generated by  the Epic EMR system/ Dragon speech recognition and may contain inherent errors or omissions not intended by the user. Grammatical errors, random word insertions, deletions and pronoun errors  are occasional consequences of this technology due to software limitations. Not all errors are caught or corrected. If there are questions or concerns about the content of this note or information contained within the body of this dictation they should be addressed directly with the author for clarification.    Signed by: Demaris Callander, MD

## 2019-03-13 NOTE — Progress Notes (Signed)
PSYCHIATRY INPATIENT PROGRESS NOTE    Date/Time: 03/13/19 10:11 AM  Patient's Name: [name], [name]  MR#:: [id number]  DOB: 19-Oct-1960      Medications:   Current medications and doses:    Current Facility-Administered Medications   Medication Dose Route Frequency Provider Last Rate Last Dose    alum & mag hydroxide-simethicone (MAALOX PLUS) 200-200-20 mg/5 mL suspension 30 mL  30 mL Oral Q6H PRN Berneta Levins, MD        ARIPiprazole (ABILIFY) tablet 5 mg  5 mg Oral Daily Jyl Heinz, MD   5 mg at 03/13/19 0932    clotrimazole (LOTRIMIN) 1 % cream   Topical BID Randal Buba, MD        diphenhydrAMINE (BENADRYL) capsule 50 mg  50 mg Oral Once PRN Berneta Levins, MD        Or    diphenhydrAMINE (BENADRYL) injection 50 mg  50 mg Intramuscular Once PRN Berneta Levins, MD        docusate sodium (COLACE) capsule 100 mg  100 mg Oral BID PRN Berneta Levins, MD        hydrocortisone (CORTAID) 1 % cream   Topical BID Randal Buba, MD        hydrOXYzine (VISTARIL) capsule 25 mg  25 mg Oral Q4H PRN Festus Aloe, MD   25 mg at 03/13/19 0932    hydrOXYzine (VISTARIL) capsule 50 mg  50 mg Oral QHS Jyl Heinz, MD   50 mg at 03/12/19 2128    ibuprofen (ADVIL) tablet 400 mg  400 mg Oral Q6H PRN Berneta Levins, MD   400 mg at 03/13/19 5409    lactobacillus/streptococcus (RISAQUAD) capsule 1 capsule  1 capsule Oral Daily Randal Buba, MD   1 capsule at 03/13/19 0932    loperamide (IMODIUM) capsule 2 mg  2 mg Oral Q3H PRN Berneta Levins, MD        nystatin (MYCOSTATIN) cream   Topical BID Randal Buba, MD        OLANZapine (ZyPREXA) tablet 10 mg  10 mg Oral BID PRN Berneta Levins, MD        ondansetron [facility]) tablet 4 mg  4 mg Oral Q8H PRN Berneta Levins, MD        QUEtiapine (SEROquel) tablet 50 mg  50 mg Oral QHS PRN Berneta Levins, MD           Legal Status:   Voluntary    Subjective Findings:      Symptoms:  Low mood, report of racing thought and  self-harm   Medication Side Effects:  none   Collateral History:  record   Family Involvement: none   Psychosocial Functioning: poor   Attending Groups: yes    Objective Findings:    Slept: 7 hours     Took all medications as scheduled.   PRNs: Vistaril 25 mg, Ibuprofen 400 mg       Mental Status Evaluation:     Appearance:  age appropriate and casually dressed   Behavior:  normal   Speech:  normal volume   Mood:  dysthymic   Affect:  flat   Thought Process:  normal   Thought Content:  normal   Sensorium:  person, place and time/date   Cognition:  grossly intact   Insight:  limited   Judgment:  limited       Neuro Vegetative Functions:      Energy: Energy level is good.  The patient is able  to perform most usual functions.   Sleep: Sleep pattern was reported as intact and regular with no initial, middle or late insomnia   Appetite: Appetite is reported to be intact, with no significant changes in weight.    Vital Signs:     Vitals:    03/13/19 0740   BP: 125/77   Pulse: 80   Resp: 18   Temp: 97.4 F (36.3 C)   SpO2: 98%        Laboratory Assessment:     Results     Procedure Component Value Units Date/Time    Lipid panel [[id number]] Collected: 03/12/19 0713    Specimen: Blood Updated: 03/12/19 1500     Cholesterol 152 mg/dL      Triglycerides 102 mg/dL      HDL 47 mg/dL      LDL Calculated 79 mg/dL      VLDL Calculated 26 mg/dL      Cholesterol / HDL Ratio 3.2    Hemolysis index [[id number]] Collected: 03/12/19 0713     Updated: 03/12/19 1500     Hemolysis Index 4           Assessment:   Clinical formulation:      Patient likely has adjustment disorder, depressed type due to stressors with recent loss of work. He reports history of BPAD2 that stabilizing with medications. Serial observations with treatment will help to best determine if there is underlying illness.     Today he reports he slept well last night, but then endorses constant racing thoughts and self-harm ideation while listing a series of  options (cutting, traffic, jumping).     Diagnoses:      Axis I: Adjustment Disorder, reported h/o BPAD    Axis [id number]   Axis III: See problem list in the medical record   Axis IV: Economic problems   Housing problems   Occupational problems   Axis V:51-60: moderate symptoms.: Highest in the last year unknown.    Plan:     Medication Plan:    #Adjustment Disorder  #BPAD2, per history  - Continue to evaluate  - Initiate Abilify 5 mg tonight  - Vistaril 50 mg nightly     Medication Education: provided    Medical work-up plan/testing:  per record    Plan for Family Involvement:  none    Aftercare Plan: to be determined    Ongoing hospitalization required for medication re-initiation and stabilization     Other Providers Contact Information and Dates Contacted: Per record    On this admission patient educated about and provided input into their treatment plan.  Patient understands potential risks and benefits of proposed treatment plan. -    Expected length of stay: 3-5 days  Total floor time: 15 minutes  At least 50% in coordination of care and counseling:Yes        Signed by: Jyl Heinz, MD   03/13/19 10:11 AM

## 2019-03-14 DIAGNOSIS — F3162 Bipolar disorder, current episode mixed, moderate: Secondary | ICD-10-CM

## 2019-03-14 LAB — HEMOGLOBIN A1C
Average Estimated Glucose: 82.5 mg/dL
Hemoglobin A1C: 4.5 % — ABNORMAL LOW (ref 4.6–5.9)

## 2019-03-14 LAB — URINE CHLAMYDIA/NEISSERIA BY PCR
Chlamydia DNA by PCR: NEGATIVE
Neisseria gonorrhoeae by PCR: NEGATIVE

## 2019-03-14 MED ORDER — ARIPIPRAZOLE 10 MG PO TABS
10.0000 mg | ORAL_TABLET | Freq: Every day | ORAL | Status: DC
Start: 2019-03-15 — End: 2019-03-17
  Administered 2019-03-15 – 2019-03-17 (×3): 10 mg via ORAL
  Filled 2019-03-14 (×4): qty 1

## 2019-03-14 NOTE — Plan of Care (Signed)
Problem: Pain  Goal: Pain at adequate level as identified by patient  Outcome: Not Progressing  Flowsheets (Taken 03/14/2019 0218)  Pain at adequate level as identified by patient: Assess pain on admission, during daily assessment and/or before any "as needed" intervention(s)  Note: Complained of bilateral knee pain stating "I have arthritis", requested and received Advil 400 mg prn for pain at 2131 with effective result.     Problem: Thought Disorder  Goal: Verbalizes reduction in hallucinations/delusions  Outcome: Not Progressing  Flowsheets (Taken 03/14/2019 0218)  Verbalizes reduction in hallucinations/delusions:   Monitor the presence of hallucinations/delusions every shift   Reality test with patient if patient able to tolerate  Note: Von endorsed to auditory hallucination; stated the voices are telling him "the world is coming to an end-it's just biblical stuff".     Patient currently denies SI/HI. Isolative to self in his room stating "I have PTSD, I hate crowds that's why I stay in my room reading the Bible". Mood is depressed, flat affect, cooperative attitude.  Continues on q15 minutes checks for safety and needs.

## 2019-03-14 NOTE — Plan of Care (Signed)
Problem: Thought Disorder  Goal: Verbalizes reduction in hallucinations/delusions  Outcome: Not Progressing  Flowsheets (Taken 03/14/2019 2227)  Verbalizes reduction in hallucinations/delusions:   Monitor the presence of hallucinations/delusions every shift   Reality test with patient if patient able to tolerate     Problem: Mood Disorder  Goal: Reports improved mood  Outcome: Not Progressing  Flowsheets (Taken 03/14/2019 2227)  Reports improved mood:   Frequent brief 1:1 conversations with the patient to assess mood /coping response   Encourage the patient to verbalize feelings of anxiety, anger, and fears     Problem: Severe Anxiety  Goal: Verbalizes reduced anxiety  Outcome: Not Progressing  Flowsheets (Taken 03/14/2019 2227)  Verbalizes reduced anxiety:   Facilitate expression of feelings, fears, concerns, anxiety   Educate about stress reduction techniques and healthy coping responses   Assist to identify signs/symptoms of anxiety     Problem: Loss of functioning (Thought Disorder, Mood Disturbance and/or Severe Anxiety) AS EVIDENCED BY...  Goal: Identify and participate in supportive program therapies  Outcome: Not Progressing  Flowsheets (Taken 03/14/2019 2227)  Identify and participate in supportive program therapies:   Encourage attendance and reinforce small successes in participation   Identify target number of therapies patient will attend daily as appropriate to functioning level     Problem: Pain  Goal: Pain at adequate level as identified by patient  Outcome: Progressing  Flowsheets (Taken 03/14/2019 2227)  Pain at adequate level as identified by patient:   Identify patient comfort function goal   Assess pain on admission, during daily assessment and/or before any "as needed" intervention(s)   Reassess pain within 30-60 minutes of any procedure/intervention, per Pain Assessment, Intervention, Reassessment (AIR) Cycle     Problem: Loss of functioning (Thought Disorder, Mood Disturbance and/or Severe  Anxiety) AS EVIDENCED BY...  Goal: Will remain safe during hospitalization  Outcome: Progressing  Flowsheets (Taken 03/14/2019 2227)  Will remain safe during hospitalization:   Provide medication teaching including name, dosage, benefits, action, effect and side effects   Ensure Informed consent for all psychotropic medications   Encourage to report response to medications including side effects   Verbalizes understanding of medication, benefits, and side effects     Pt stayed in bed the entire night except for snacks. Endorsed to 6/10 anxiety, scheduled Vistaril 50 mg given with good effect. Denies SI/HI/AVH and pain. Mood depressed, blunted affect with a cooperative attitude. Med compliant. Maintains safety. Will continue to monitor for safety and needs.

## 2019-03-14 NOTE — Plan of Care (Signed)
Problem: Psychosocial and Spiritual Needs  Goal: Demonstrates ability to cope with hospitalization/illness  Outcome: Progressing     Problem: Loss of functioning (Thought Disorder, Mood Disturbance and/or Severe Anxiety) AS EVIDENCED BY...  Goal: Will identify long and short term goals based on individual needs and strengths  Outcome: Progressing     Problem: Thought Disorder  Goal: Verbalizes reduction in hallucinations/delusions  Outcome: Progressing     Problem: Mood Disorder  Goal: Reports improved mood  Outcome: Progressing           Patient presents with a blunted affect and euthymic mood. Behavior is appropriate, isolative to self. Patient comes out to make his needs know & met. Patient denied SI/HI but endorsed chronic AH. Patient is compliant with medications and meals, and prefers 1:1 with the therapist. Patient was medicated with Ibuprofen  400 mg x2 for arthritis pain with good effect.

## 2019-03-14 NOTE — Progress Notes (Signed)
Temporary Detainment Order Information  Status: Voluntary  LIPOS eligible?: No    Healthcare Decisions  Interviewed:: Patient  Orientation/Decision Making Abilities of Patient: Alert and Oriented x3, able to make decisions  Advance Directive: Patient does not have advance directive  Healthcare Agent Appointed: No    Prior to Admission/Psychosocial  Prior level of function: Independent with ADLs, Ambulates independently  Prior level of emotional / behavioral functioning: Pt has a history of inpatient hospitilizations with his last being last year.  Type of Residence: Private residence  Home Layout: One level  Have running water, electricity, heat, etc?: Yes  Living Arrangements: Alone  Prior psychiatric admission? (Detail): PT reports his last hospitilization was a year ago.  Prior Crisis Care/Residential Tx - Past 30 days? : none  Legal concerns?: Not applicable  Legal / Contacts: n/a  Psychiatrist information: Pt reports he gets his medication from [address]  Date psychiatrist last seen?: n/a  Therapist / community case manager information: none  Date therapist / community case Production designer, theatre/television/film last seen?: n/a  Runner, broadcasting/film/video (CSB) patient?: No  Indicate location of CS[facility]:: [facility]  Other outpatient mental health/substance abuse services:: none    Substance Abuse Treatment History  Previous Substance Abuse Treatment?: No    Psych Discharge Planning  Support Systems: Other (Comment)(Pt reports he gets support from the clinic.)  PT Evaluation Needed: No  OT Evalulation Needed: No  SLP Evaluation Needed: No  Patient expects to be discharged to:: home  Mode of transportation:: Medicaid transport         Important Message from Medicare Notice  Patient received 1st IMM Letter?: n/a    Delman Cheadle  [facility]

## 2019-03-14 NOTE — Progress Notes (Addendum)
PSYCHIATRY INPATIENT PROGRESS NOTE    Date/Time: 03/14/19 12:08 PM  Patient's Name: QUINTRELL, BAZE  MR#:: [id number]  DOB: 04-Aug-1960    Patient is a 58 y.o. African American male presents with for medication management for his self-reported bipolar and PTSD illnesses.     Mr. 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 He became so overwhelmed at work and his coworkers brought him to the hospital because he was threatening to hurt himself.       Medications:   Current medications and doses:    Current Facility-Administered Medications   Medication Dose Route Frequency Provider Last Rate Last Dose    alum & mag hydroxide-simethicone (MAALOX PLUS) 200-200-20 mg/5 mL suspension 30 mL  30 mL Oral Q6H PRN Berneta Levins, MD        ARIPiprazole (ABILIFY) tablet 5 mg  5 mg Oral Daily Jyl Heinz, MD   5 mg at 03/14/19 5409    clotrimazole (LOTRIMIN) 1 % cream   Topical BID Randal Buba, MD        diphenhydrAMINE (BENADRYL) capsule 50 mg  50 mg Oral Once PRN Berneta Levins, MD        Or    diphenhydrAMINE (BENADRYL) injection 50 mg  50 mg Intramuscular Once PRN Berneta Levins, MD        docusate sodium (COLACE) capsule 100 mg  100 mg Oral BID PRN Berneta Levins, MD        hydrocortisone (CORTAID) 1 % cream   Topical BID Elise Benne, San Jetty, MD        hydrOXYzine (VISTARIL) capsule 25 mg  25 mg Oral Q4H PRN Festus Aloe, MD   25 mg at 03/13/19 0932    hydrOXYzine (VISTARIL) capsule 50 mg  50 mg Oral QHS Jyl Heinz, MD   50 mg at 03/13/19 2125    ibuprofen (ADVIL) tablet 400 mg  400 mg Oral Q6H PRN Berneta Levins, MD   400 mg at 03/14/19 8119    lactobacillus/streptococcus (RISAQUAD) capsule 1 capsule  1 capsule Oral Daily Elise Benne San Jetty, MD   1 capsule at 03/13/19 0932    loperamide (IMODIUM) capsule 2 mg  2 mg Oral Q3H PRN Berneta Levins, MD        nystatin  (MYCOSTATIN) cream   Topical BID Elise Benne, San Jetty, MD        OLANZapine (ZyPREXA) tablet 10 mg  10 mg Oral BID PRN Berneta Levins, MD        ondansetron [facility]) tablet 4 mg  4 mg Oral Q8H PRN Berneta Levins, MD        QUEtiapine (SEROquel) tablet 50 mg  50 mg Oral QHS PRN Berneta Levins, MD           Legal Status:    Voluntarily     Subjective Findings:      Symptoms:  Pt endorses depressive symptoms and feeling overwhelmed, endorses on/off suicidal thoughts no specific plan or intent, states that he still feel the same and didn't see that much improvement in his symptoms except being able to sleep well at night, was sleeping 2-3 hrs/night prior to his presentation, states that Hydroxyzine helps hi sleep, doesn't want to be on Trazodone or Seroquel; for sleep due to adverse effect of being drowsy and groggy, states that he was [place] prior to him coming here, but ok  to take Abilify. Pt was unable to explained how he run out of his meds since he usually goes to [facility] for his meds refill and that he was still here in the area (was working in [place]), states that he is off his medication for few days and became very anxious and then suicidal, here he contacted for safety. Pt states that he wants to continue to follow up with [facility] and not interested in getting connected to other programs. Pt denies use of substance, but per chart review pt has h/o cocaine, alcohol use. Pt denies HI/AVH     Medication Side Effects:  Denies    Collateral History:   Medical chart   Family Involvement: not yet   Psychosocial Functioning: regressed   Attending Groups: not yet, interested in individual therapy, doesn't want to be in group due to his PTSD    Objective Findings:    depressive symptoms, passive suicidal ideations    Mental Status Evaluation:     Appearance:  age appropriate and well dressed   Behavior:  normal and calm and cooperative    Speech:  normal pitch and  normal volume   Mood:  euthymic   Affect:  mood-congruent   Thought Process:  goal directed   Thought Content:  suicidal   Sensorium:  person, place, situation, month of year and year   Cognition:  grossly intact   Insight:  fair   Judgment:  fair       Neuro Vegetative Functions:      Energy: Energy level is good.  The patient is able to perform most usual functions.   Sleep: Sleep pattern was reported as intact and regular with no initial, middle or late insomnia   Appetite: Appetite is reported to be intact, with no significant changes in weight.    Vital Signs:     Vitals:    03/13/19 2035   BP: 99/67   Pulse: 74   Resp: 16   Temp: 97.7 F (36.5 C)   SpO2: 96%        Laboratory Assessment:     Results     Procedure Component Value Units Date/Time    Hemoglobin A1C [[id number]]  (Abnormal) Collected: 03/14/19 0709    Specimen: Blood Updated: 03/14/19 0951     Hemoglobin A1C 4.5 %      Average Estimated Glucose 82.5 mg/dL     Narrative:      This is NOT the correct Test for Patients with  Hemoglobinopathy.           Assessment:   Clinical formulation:  Patient is a 58 y.o. African American male presents with for medication management for his self-reported bipolar and PTSD illnesses.     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 He became so overwhelmed at work and his coworkers brought him to the hospital because he was threatening to hurt himself. pt is poor compliant with his meds and doesn't regularly follow up with outpatient psychiatrist, pt has multiple hospitalization for similar penetrations, pt was made aware that best course of treatment for him id to continue taking his prescribed meds and regularly follow up with his outpatient psychiatrest    Diagnoses:      Axis I: Hypomanic bipolar II disorder r/o schizoaffective d/o    Axis [id number]  Axis III: See problem list in the medical  record   Axis IV: Occupational problems   Other psychosocial or environmental problems   Problems with primary support group   Axis V:21-30: behavior considerably influenced by delusions or hallucinations OR serious impairment in judgment, communication OR inability to function in almost all areas.    Plan:     Medication Plan:   -increase Abilify to 10mg  po daily   -Vistaril 50mg  po daily at bed time     Medication Education: provided     Medical work-up plan/testing:  completed     Plan for Family Involvement:  not yet    Aftercare Plan: discharge home with plan to follow up with [facility]    Ongoing hospitalization required for safety and stabilization     Other Providers Contact Information and Dates Contacted: TBD    On this admission patient educated about and provided input into their treatment plan.  Patient understands potential risks and benefits of proposed treatment plan.     Expected length of stay: 3-5 days   Total floor time: 35 min  At least 50% in coordination of care and counseling:Yes        Signed by: Melynda Keller, MD   03/14/19 12:08 PM

## 2019-03-14 NOTE — Treatment Plan (Cosign Needed)
Interdisciplinary Treatment Plan Update Meeting    03/14/2019  Jeannette [place]    Participants:  Patient:  [name]  Attending Physician:  Melynda Keller, MD  RN: Gaetano Net  Mental Health Therapist: Kyra Leyland  Social Worker: Delman Cheadle      Objective:  Review response to treatment, reassess needs/goals, update plan as indicated incorporating patient's strengths and stated needs, goals, and preferences.    1. Summary of Patient Progress on Treatment Plan Goals:  Patient was actively engaged in the tx team process and offered appropriate insight and feedback. Pt appeared grandiose and delusional at times. The plan is to take new medications and monitor his progress.    2. Level of Patient Involvement:  Actively engaged/contributing    3. Patient Understanding of Plan of Care:  Able to verbalize goals and interventions, Able to verbalize discharge criteria    4. Level of Agreement/Commitment to Plan of Care:  Agrees with and is committed to plan of care          Contributor Signatures:      MD_________________________________ Date___________________    SW_________________________________Date ___________________    RN _________________________________Date____________________    Patient_______________________________Date____________________    Other________________________________Date ___________________    Other_____[name]___________________________Date __10/19/20_________________    (This document is signed electronically by Clinical research associate and electronic co-signer.  Other participants sign a printed copy which is scanned into the EMR)

## 2019-03-14 NOTE — Plan of Care (Signed)
Patient has not attended any groups thus far today.  He has been isolative to his room.  Will continue to encourage group attendance and participation in order to increase positive coping skills and express feelings regarding current situation.

## 2019-03-15 NOTE — Plan of Care (Signed)
Problem: Loss of functioning (Thought Disorder, Mood Disturbance and/or Severe Anxiety) AS EVIDENCED BY...  Goal: Identify and participate in supportive program therapies  Outcome: Not Progressing  Flowsheets (Taken 03/15/2019 1736)  Identify and participate in supportive program therapies:   Identify target number of therapies patient will attend daily as appropriate to functioning level   Encourage attendance and reinforce small successes in participation  Note: Therapist checked in with the patient 1:1 regarding patient's mood, goals for the day, and to educate the patent on different aspects of the day's routine and unit rules.  Patient was told the groups for the day, encouraged to reach out for 1:1's as needed, and was given unit rules, specifically emphasizing ones pertaining to the corona virus. Patient has not attended any groups thus far today.

## 2019-03-15 NOTE — Plan of Care (Signed)
Problem: Pain  Goal: Pain at adequate level as identified by patient  Outcome: Not Progressing  Flowsheets (Taken 03/15/2019 1639)  Pain at adequate level as identified by patient:   Reassess pain within 30-60 minutes of any procedure/intervention, per Pain Assessment, Intervention, Reassessment (AIR) Cycle   Assess pain on admission, during daily assessment and/or before any "as needed" intervention(s)     Problem: Thought Disorder  Goal: Verbalizes reduction in hallucinations/delusions  Outcome: Progressing  Flowsheets (Taken 03/14/2019 2227 by Sander Nephew, RN)  Verbalizes reduction in hallucinations/delusions:   Monitor the presence of hallucinations/delusions every shift   Reality test with patient if patient able to tolerate     Problem: Mood Disorder  Goal: Reports improved mood  Outcome: Progressing  Flowsheets (Taken 03/14/2019 2227 by Sander Nephew, RN)  Reports improved mood:   Frequent brief 1:1 conversations with the patient to assess mood /coping response   Encourage the patient to verbalize feelings of anxiety, anger, and fears     Problem: Severe Anxiety  Goal: Verbalizes reduced anxiety  Outcome: Progressing  Flowsheets (Taken 03/14/2019 2227 by Sander Nephew, RN)  Verbalizes reduced anxiety:   Facilitate expression of feelings, fears, concerns, anxiety   Educate about stress reduction techniques and healthy coping responses   Assist to identify signs/symptoms of anxiety    Rogerio is A/Ox4, presented with depressed mood, blunted affect, seemed to have fair insight, concentration, and judgment. Pt's behaviors are calm and cooperative. Pt stated he his mood has improved since the admission. However, he endorsed fleeting thought of suicide, denies intention, and plan. He said, "I wouldn't do it here." He also reported having arthritis pain "10/10", PRN ibuprofen given with moderate effect. Pt denied HI/ AVH.  Pt complied with all scheduled medication, ate, and hydrated well. Pt is  continued with Q15 min observation.

## 2019-03-15 NOTE — Progress Notes (Signed)
PSYCHIATRY INPATIENT PROGRESS NOTE    Date/Time: 03/15/19 1:38 PM  Patient's Name: [name], [name]  MR#:: [id number]  DOB: Jul 27, 1960    Events leading to hospitalization:    Patient is a 58 y.o. African American male presents with for medication management for his self-reported bipolar and PTSD illnesses.     Mr. 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 He became so overwhelmed at work and his coworkers brought him to the hospital because he was threatening to hurt himself.       Medications:   Current medications and doses:    Current Facility-Administered Medications   Medication Dose Route Frequency Provider Last Rate Last Dose    alum & mag hydroxide-simethicone (MAALOX PLUS) 200-200-20 mg/5 mL suspension 30 mL  30 mL Oral Q6H PRN Berneta Levins, MD        ARIPiprazole (ABILIFY) tablet 10 mg  10 mg Oral Daily Melynda Keller, MD   10 mg at 03/15/19 4782    clotrimazole (LOTRIMIN) 1 % cream   Topical BID Randal Buba, MD        diphenhydrAMINE (BENADRYL) capsule 50 mg  50 mg Oral Once PRN Berneta Levins, MD        Or    diphenhydrAMINE (BENADRYL) injection 50 mg  50 mg Intramuscular Once PRN Berneta Levins, MD        docusate sodium (COLACE) capsule 100 mg  100 mg Oral BID PRN Berneta Levins, MD        hydrocortisone (CORTAID) 1 % cream   Topical BID Elise Benne, San Jetty, MD        hydrOXYzine (VISTARIL) capsule 25 mg  25 mg Oral Q4H PRN Festus Aloe, MD   25 mg at 03/13/19 0932    hydrOXYzine (VISTARIL) capsule 50 mg  50 mg Oral QHS Jyl Heinz, MD   50 mg at 03/14/19 2123    ibuprofen (ADVIL) tablet 400 mg  400 mg Oral Q6H PRN Berneta Levins, MD   400 mg at 03/15/19 9562    lactobacillus/streptococcus (RISAQUAD) capsule 1 capsule  1 capsule Oral Daily Elise Benne, San Jetty, MD   1 capsule at 03/13/19 0932    loperamide (IMODIUM) capsule 2 mg  2 mg Oral Q3H  PRN Berneta Levins, MD        nystatin (MYCOSTATIN) cream   Topical BID Elise Benne, San Jetty, MD        OLANZapine (ZyPREXA) tablet 10 mg  10 mg Oral BID PRN Berneta Levins, MD        ondansetron [facility]) tablet 4 mg  4 mg Oral Q8H PRN Berneta Levins, MD        QUEtiapine (SEROquel) tablet 50 mg  50 mg Oral QHS PRN Berneta Levins, MD           Legal Status:    Voluntarily     Subjective Findings:      Symptoms:  Pt reports that he feels the same, reports depressive symptoms and suicidal ideation, no intent or plan, reports he took his medication, but doesn't feel that much of a different, pt contracted for safety and he feels safe here, pt reports that he doesn't want to be outside with other pts due to his PTSD symptoms and prefers to stay in his room, writer offered group or individual therapy, but pt declined. Pt reports eating better and sleeping very  well, denies anya cute issues beside his mood symptoms. Pt denies HI/AVH     Medication Side Effects:  Denies    Collateral History:   Medical chart   Family Involvement: not yet   Psychosocial Functioning: regressed   Attending Groups: not yet, interested in individual therapy, doesn't want to be in group due to his PTSD    Objective Findings:    depressive symptoms, passive suicidal ideations    Mental Status Evaluation:     Appearance:  age appropriate and well dressed   Behavior:  normal and calm and cooperative    Speech:  normal pitch and normal volume   Mood:  depressed   Affect:  mood-congruent   Thought Process:  goal directed   Thought Content:  suicidal passive   Sensorium:  person, place, situation, month of year and year   Cognition:  grossly intact   Insight:  fair   Judgment:  fair       Neuro Vegetative Functions:      Energy: Energy level is good.  The patient is able to perform most usual functions.   Sleep: Sleep pattern was reported as intact and regular with no initial, middle or late insomnia   Appetite: Appetite is reported  to be intact, with no significant changes in weight.    Vital Signs:     Vitals:    03/15/19 0722   BP: 100/65   Pulse: 71   Resp: 17   Temp: 98.1 F (36.7 C)   SpO2: 98%        Laboratory Assessment:     Results     ** No results found for the last 24 hours. **           Assessment:   Clinical formulation:  Patient is a 58 y.o. African American male presents with for medication management for his self-reported bipolar and PTSD illnesses.     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 He became so overwhelmed at work and his coworkers brought him to the hospital because he was threatening to hurt himself. pt is poor compliant with his meds and doesn't regularly follow up with outpatient psychiatrist, pt has multiple hospitalization for similar penetrations, pt was made aware that best course of treatment for him id to continue taking his prescribed meds and regularly follow up with his outpatient psychiatrest    Diagnoses:      Axis I: Hypomanic bipolar II disorder r/o schizoaffective d/o    Axis WG:[id number]   Axis III: See problem list in the medical record   Axis IV: Occupational problems   Other psychosocial or environmental problems   Problems with primary support group   Axis V:21-30: behavior considerably influenced by delusions or hallucinations OR serious impairment in judgment, communication OR inability to function in almost all areas.    Plan:     Medication Plan:   -Abilify to 10mg  po daily   -Vistaril 50mg  po daily at bed time     Medication Education: provided     Medical work-up plan/testing:  completed     Plan for Family Involvement:  not yet    Aftercare Plan: discharge home with plan to follow up with [facility]    Ongoing hospitalization required for safety and stabilization     Other Providers Contact Information and Dates  Contacted: TBD    On this admission  patient educated about and provided input into their treatment plan.  Patient understands potential risks and benefits of proposed treatment plan. -    Expected length of stay: 3-5 days   Total floor time: 35 min  At least 50% in coordination of care and counseling:Yes        Signed by: Melynda Keller, MD   03/15/19 1:38 PM

## 2019-03-16 NOTE — Plan of Care (Signed)
Problem: Pain  Goal: Pain at adequate level as identified by patient  Outcome: Not Progressing  Flowsheets (Taken 03/16/2019 1645)  Pain at adequate level as identified by patient:   Reassess pain within 30-60 minutes of any procedure/intervention, per Pain Assessment, Intervention, Reassessment (AIR) Cycle   Assess pain on admission, during daily assessment and/or before any "as needed" intervention(s)   Identify patient comfort function goal     Problem: Thought Disorder  Goal: Verbalizes reduction in hallucinations/delusions  Outcome: Not Progressing  Flowsheets (Taken 03/16/2019 0009 by Sander Nephew, RN)  Verbalizes reduction in hallucinations/delusions:   Monitor the presence of hallucinations/delusions every shift   Reality test with patient if patient able to tolerate     Problem: Mood Disorder  Goal: Reports improved mood  Outcome: Progressing  Flowsheets (Taken 03/16/2019 0009 by Sander Nephew, RN)  Reports improved mood:   Frequent brief 1:1 conversations with the patient to assess mood /coping response   Encourage the patient to verbalize feelings of anxiety, anger, and fears     Problem: Severe Anxiety  Goal: Verbalizes reduced anxiety  Outcome: Progressing  Flowsheets (Taken 03/16/2019 0009 by Sander Nephew, RN)  Verbalizes reduced anxiety:   Assist to identify signs/symptoms of anxiety   Educate about stress reduction techniques and healthy coping responses   Facilitate expression of feelings, fears, concerns, anxiety     Problem: Loss of functioning (Thought Disorder, Mood Disturbance and/or Severe Anxiety) AS EVIDENCED BY...  Goal: Identify and participate in supportive program therapies  Outcome: Not Progressing  Flowsheets (Taken 03/16/2019 1645)  Identify and participate in supportive program therapies:   Identify target number of therapies patient will attend daily as appropriate to functioning level   Encourage attendance and reinforce small successes in participation      [name] reported he is not ready to leave, continued to hear demon voices, paranoid, and endorsed fleeting thought of suicide.    [name] is A/Ox4, presented with depressed mood, blunted affect, paranoid about strangers talking about me.  His behavior is calm, isolated in the room, and cooperative. He reported pain 10/10, chronic pain, I live with it. Ibuprofen 400mg  PO PRN is given with moderate affect. Pt continued to endorsed fleeting thought of suicide, no intention, no plan to act on here in the unit, and said hearing demon telling me to do things I don't want to do. That's why I am reading the bible. No reported of homicidal ideation.  Pt came out in the hall in the afternoon, occasionally observed in the hall walking back and fort or to let his needs know.   No safety issues were observed or reported. Pt complied with scheduled medication, medication education proper use of prescribed cream provided, pt verbalized understanding. Continue with Q15 min observation.

## 2019-03-16 NOTE — Plan of Care (Signed)
Problem: Pain  Goal: Pain at adequate level as identified by patient  Outcome: Not Progressing  Flowsheets (Taken 03/16/2019 0009)  Pain at adequate level as identified by patient:   Reassess pain within 30-60 minutes of any procedure/intervention, per Pain Assessment, Intervention, Reassessment (AIR) Cycle   Assess pain on admission, during daily assessment and/or before any "as needed" intervention(s)   Assess for risk of opioid induced respiratory depression, including snoring/sleep apnea. Alert healthcare team of risk factors identified.     Problem: Mood Disorder  Goal: Reports improved mood  Outcome: Not Progressing  Flowsheets (Taken 03/16/2019 0009)  Reports improved mood:   Frequent brief 1:1 conversations with the patient to assess mood /coping response   Encourage the patient to verbalize feelings of anxiety, anger, and fears     Problem: Severe Anxiety  Goal: Verbalizes reduced anxiety  Outcome: Not Progressing  Flowsheets (Taken 03/16/2019 0009)  Verbalizes reduced anxiety:   Assist to identify signs/symptoms of anxiety   Educate about stress reduction techniques and healthy coping responses   Facilitate expression of feelings, fears, concerns, anxiety     Problem: Loss of functioning (Thought Disorder, Mood Disturbance and/or Severe Anxiety) AS EVIDENCED BY...  Goal: Identify and participate in supportive program therapies  Outcome: Not Progressing  Flowsheets (Taken 03/16/2019 0009)  Identify and participate in supportive program therapies:   Encourage attendance and reinforce small successes in participation   Identify target number of therapies patient will attend daily as appropriate to functioning level     Problem: Psychosocial and Spiritual Needs  Goal: Demonstrates ability to cope with hospitalization/illness  Outcome: Progressing  Flowsheets (Taken 03/16/2019 0009)  Demonstrates ability to cope with hospitalizations/illness:   Include patient/ patient care companion in  decisions   Encourage participation in diversional activity   Assist patient to identify own strengths and abilities   Encourage patient to set small goals for self   Communicate referral to spiritual care as appropriate   Reinforce positive adaptation of new coping behaviors     Problem: Loss of functioning (Thought Disorder, Mood Disturbance and/or Severe Anxiety) AS EVIDENCED BY...  Goal: Will remain safe during hospitalization  Outcome: Progressing  Flowsheets (Taken 03/16/2019 0009)  Will remain safe during hospitalization:   Encourage to report response to medications including side effects   Provide medication teaching including name, dosage, benefits, action, effect and side effects   Ensure Informed consent for all psychotropic medications     Problem: Thought Disorder  Goal: Verbalizes reduction in hallucinations/delusions  Outcome: Progressing  Flowsheets (Taken 03/16/2019 0009)  Verbalizes reduction in hallucinations/delusions:   Monitor the presence of hallucinations/delusions every shift   Reality test with patient if patient able to tolerate     Pt was intermittently in the hallway with no peer interactions. Seen reading a book in his room in a depressed mood, and blunted affect. Med compliant and cooperative. Reported 5/10 bilateral lower extremities arthritic pain. PRN Ibuprofen 400 mg given with good effect. Denies thoughts of self-harm, harm to others, and AVH. Maintains safety. Will continue to monitor for for progress.
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
## 2019-03-17 MED ORDER — SERTRALINE HCL 50 MG PO TABS
50.0000 mg | ORAL_TABLET | Freq: Every day | ORAL | Status: DC
Start: 2019-03-17 — End: 2019-03-21
  Administered 2019-03-17 – 2019-03-21 (×5): 50 mg via ORAL
  Filled 2019-03-17 (×5): qty 1

## 2019-03-17 MED ORDER — ARIPIPRAZOLE 5 MG PO TABS
15.0000 mg | ORAL_TABLET | Freq: Every day | ORAL | Status: DC
Start: 2019-03-18 — End: 2019-03-21
  Administered 2019-03-18 – 2019-03-21 (×4): 15 mg via ORAL
  Filled 2019-03-17 (×5): qty 1

## 2019-03-17 NOTE — Plan of Care (Signed)
Problem: Pain  Goal: Pain at adequate level as identified by patient  Outcome: Not Progressing     Problem: Thought Disorder  Goal: Verbalizes reduction in hallucinations/delusions  Outcome: Not Progressing     Problem: Loss of functioning (Thought Disorder, Mood Disturbance and/or Severe Anxiety) AS EVIDENCED BY...  Goal: Will remain safe during hospitalization  Outcome: Progressing     Problem: Mood Disorder  Goal: Reports improved mood  Outcome: Progressing     Problem: Severe Anxiety  Goal: Verbalizes reduced anxiety  Outcome: Progressing     Problem: At Risk for Suicide AS EVIDENCED BY...  Goal: Patient will remain safe during hospitalization  Outcome: Progressing  Goal: Suicide Alert Level Moderate  Outcome: Progressing  Goal: Verbalizes understanding of medication, benefits, and side effects  Outcome: Progressing     [name] is mostly isolative to self in his room, reading a Bible. Denied SI/HI stating "currently not, but I was earlier today", anxiety, depression, VH but endorsed to auditory hallucination and pain to bilateral knee. Patient stated the voices are telling him "to kill myself, they are also saying the world is coming to an end. All these voices are evil".  Per [name], reading the Bible "helps me with the voices".  Encouraged patient to come to writer/staff if the voices increases or becomes uncontrollable, verbalized understanding. Requested and received Ibuprofen 400 mg prn at 2111 with effective result.

## 2019-03-17 NOTE — Plan of Care (Signed)
Patient has not attended any groups thus far today.   Will continue to encourage group attendance and participation in order to increase positive coping skills and express feelings regarding current situation.
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
## 2019-03-17 NOTE — Plan of Care (Signed)
Problem: Loss of functioning (Thought Disorder, Mood Disturbance and/or Severe Anxiety) AS EVIDENCED BY...  Goal: Will identify long and short term goals based on individual needs and strengths  Outcome: Progressing  [name] reports that the goal for today 03/17/2019 is to meet with my physician, ask questions about my medications and call a family member . Goal achieved.    Goal: Will remain safe during hospitalization  Outcome: Progressing  Pt slept well and maintained safety.  Goal: Completes discharge safety and recovery plan  Outcome: Progressing  Ongoing treatment and medication adjustment is been made.     Problem: Thought Disorder  Goal: Verbalizes reduction in hallucinations/delusions  Outcome: Progressing  Denies pain,anxiety,depression and SI/HI/AVH.     Problem: Mood Disorder  Goal: Reports improved mood  Outcome: Not Progressing  Mood is depressed,labile affect and a cooperative attitude      Problem: Loss of functioning (Thought Disorder, Mood Disturbance and/or Severe Anxiety) AS EVIDENCED BY...  Goal: Identify and participate in supportive program therapies  Outcome: Not Progressing  Pt encouraged to attend and participate in therapy groups.  Problem: At Risk for Suicide AS EVIDENCED BY...  Goal: Suicide Alert Level Moderate  Outcome: Progressing  Denies any SI/HI/AVH  Goal: Verbalizes understanding of medication, benefits, and side effects  Outcome: Progressing  Received Ibuprofen 400 mg po prn/pain with positive effects.    Received on the milieu,pt remained isolative to self ,watch TV and retired to room.A0X4 with appropriate behavior.Mood is depressed,labile affect and a cooperative attitude.Denies anxiety,depression and SI/HI/AVH.Received Ibuprofen 400 mg po prn/pain with positive effects.Pt slept well and maintained safety.

## 2019-03-17 NOTE — Plan of Care (Addendum)
Problem: Pain  Goal: Pain at adequate level as identified by patient  03/17/2019 1157 by Jamse Arn, RN  Flowsheets (Taken 03/17/2019 1157)  Pain at adequate level as identified by patient:   Identify patient comfort function goal   Assess for risk of opioid induced respiratory depression, including snoring/sleep apnea. Alert healthcare team of risk factors identified.   Assess pain on admission, during daily assessment and/or before any "as needed" intervention(s)   Evaluate patient's satisfaction with pain management progress   Evaluate if patient comfort function goal is met  Note: Patient complained of generalized pain. He stated, "I have Arthritis all over my bones." He pointed out with his fingers all over his body. He requested Motrin as ordered with effective results.   03/17/2019 1155 by Jamse Arn, RN  Outcome: Progressing     Problem: Psychosocial and Spiritual Needs  Goal: Demonstrates ability to cope with hospitalization/illness  03/17/2019 1157 by Jamse Arn, RN  Flowsheets (Taken 03/17/2019 1157)  Demonstrates ability to cope with hospitalizations/illness:   Encourage verbalization of feelings/concerns/expectations   Provide quiet environment   Assist patient to identify own strengths and abilities   Reinforce positive adaptation of new coping behaviors   Communicate referral to spiritual care as appropriate   Encourage patient to set small goals for self   Encourage participation in diversional activity  Note: He said, "I have PTSD and I don't like going  to the meetings or being in public." He showered in the morning, picked up his breakfast and took it to his room. He  ate 100 % of breakfast. Later on. He  requested the creams for his pennis head. He said, "I am not circumcised, if I don't put the cream on my pennis I will get a rash." Pennis was not assessed today. Patient declined. Patient was calm and cooperative today, however, he remained guarded. Will continue to  monitor safety.    03/17/2019 1155 by Jamse Arn, RN  Outcome: Progressing     Problem: Loss of functioning (Thought Disorder, Mood Disturbance and/or Severe Anxiety) AS EVIDENCED BY...  Goal: Will identify long and short term goals based on individual needs and strengths  03/17/2019 1157 by Jamse Arn, RN  Flowsheets (Taken 03/17/2019 1157)  Pt will identify long and short term treatment goals based on individual needs and strengths:   Assist to identify goals for treatment based on individual needs and strengths   Assist patient to sign acknowledgement of treatment plan participation   Assist patient to define criteria for discharge  03/17/2019 1155 by Jamse Arn, RN  Outcome: Progressing  Goal: Will remain safe during hospitalization  03/17/2019 1157 by Jamse Arn, RN  Flowsheets (Taken 03/17/2019 1157)  Will remain safe during hospitalization:   Provide medication teaching including name, dosage, benefits, action, effect and side effects   Verbalizes understanding of medication, benefits, and side effects   Encourage to report response to medications including side effects  Note: Patient commits to safety as inpatient.   03/17/2019 1155 by Jamse Arn, RN  Outcome: Progressing  Goal: Completes discharge safety and recovery plan  03/17/2019 1157 by Jamse Arn, RN  Flowsheets (Taken 03/17/2019 1157)  Completes discharge safety and recovery plan:   Facilitate completion of discharge safety plan   Initial evaluation of discharge needs   Establish transitional plan to next level of care with follow up appointment date   Identify community resources to support discharge planning  Address availability of firearms in home environment with family and patient  Note: Patient reported that he lives in an apartment and he will return there on discharge.   03/17/2019 1155 by Jamse Arn, RN  Outcome: Progressing     Problem: Thought Disorder  Goal: Verbalizes reduction in  hallucinations/delusions  03/17/2019 1157 by Jamse Arn, RN  Flowsheets (Taken 03/17/2019 1157)  Verbalizes reduction in hallucinations/delusions:   Reality test with patient if patient able to tolerate   Monitor the presence of hallucinations/delusions every shift  Note: Patient was noted responding to the unseen in his room.   03/17/2019 1155 by Jamse Arn, RN  Outcome: Progressing     Problem: Mood Disorder  Goal: Reports improved mood  03/17/2019 1157 by Jamse Arn, RN  Flowsheets (Taken 03/17/2019 1157)  Reports improved mood:   Encourage the patient to verbalize feelings of anxiety, anger, and fears   Frequent brief 1:1 conversations with the patient to assess mood /coping response  Note: Patient was calm and cooperative this am with his routine  medications.  03/17/2019 1155 by Jamse Arn, RN  Outcome: Progressing     Problem: Severe Anxiety  Goal: Verbalizes reduced anxiety  03/17/2019 1157 by Jamse Arn, RN  Flowsheets (Taken 03/17/2019 1157)  Verbalizes reduced anxiety:   Facilitate expression of feelings, fears, concerns, anxiety   Assist to identify signs/symptoms of anxiety   Educate about stress reduction techniques and healthy coping responses  03/17/2019 1155 by Jamse Arn, RN  Outcome: Progressing     Problem: Loss of functioning (Thought Disorder, Mood Disturbance and/or Severe Anxiety) AS EVIDENCED BY...  Goal: Identify and participate in supportive program therapies  03/17/2019 1157 by Jamse Arn, RN  Flowsheets (Taken 03/17/2019 1157)  Identify and participate in supportive program therapies:   Identify target number of therapies patient will attend daily as appropriate to functioning level   Encourage attendance and reinforce small successes in participation     Problem: At Risk for Suicide AS EVIDENCED BY...  Goal: Will identify long and short term goals based on individual needs and strengths  03/17/2019 1157 by Jamse Arn, RN  Flowsheets (Taken  03/17/2019 1157)  Pt will identify long and short term treatment goals based on individual needs and strengths:   Assist to identify goals for treatment based on individual needs and strengths   Assist patient to sign acknowledgement of treatment plan participation   Assist patient to define criteria for discharge  03/17/2019 1155 by Jamse Arn, RN  Outcome: Progressing  Goal: Patient will remain safe during hospitalization  03/17/2019 1157 by Jamse Arn, RN  Flowsheets (Taken 03/17/2019 1157)  Patient will remain safe during hospitalization: Complete inpatient safety plan  03/17/2019 1155 by Jamse Arn, RN  Outcome: Progressing  Goal: Suicide Alert Level Moderate  03/17/2019 1157 by Jamse Arn, RN  Flowsheets (Taken 03/17/2019 1157)  Suicide Alert Level Moderate:   Every 15 minute checks   Educate patient to ask for help  03/17/2019 1155 by Jamse Arn, RN  Outcome: Progressing  Goal: Identifies triggers and protective strategies  03/17/2019 1157 by Jamse Arn, RN  Flowsheets (Taken 03/17/2019 1157)  Identifies triggers and protecive strategies:   Reinforce patient's efforts to manage anger and aggression   Assist to identify triggers to aggressive/violent thoughts   Assist to identify safe, positive strategies to cope  03/17/2019 1155 by Jamse Arn, RN  Outcome: Progressing  Goal: Verbalizes understanding of medication, benefits, and side effects  03/17/2019 1157 by Jamse Arn, RN  Flowsheets (Taken 03/17/2019 1157)  Verbalizes understanding of medication, benefits, and side effects:   Ensure Informed consent for all psychotropic medications   Provide medication teaching including name, dosage, benefits, action, effect and side effects   Encourage to report response to medications including side effects  03/17/2019 1155 by Jamse Arn, RN  Outcome: Progressing  Goal: Identify and participate in supportive program therapies  03/17/2019 1157 by Jamse Arn,  RN  Flowsheets (Taken 03/17/2019 1157)  Identify and participate in supportive program therapies:   Identify target number of therapies patient will attend daily as appropriate to functioning level   Encourage attendance and reinforce small successes in participation  Patient was encouraged to attend groups today.   03/17/2019 1155 by Jamse Arn, RN  Outcome: Progressing

## 2019-03-18 NOTE — Plan of Care (Signed)
Problem: Loss of functioning (Thought Disorder, Mood Disturbance and/or Severe Anxiety) AS EVIDENCED BY...  Goal: Will identify long and short term goals based on individual needs and strengths  Outcome: Progressing  [name] reports that the goal for today 03/18/2019 is to meet with my physician and ask questions about my medications . Goal achieved.    Goal: Will remain safe during hospitalization  Outcome: Progressing  Pt slept well and maintained safety.  Goal: Completes discharge safety and recovery plan  Outcome: Progressing  Ongoing treatment and medication adjustment is been made.     Problem: Thought Disorder  Goal: Verbalizes reduction in hallucinations/delusions  Outcome: Progressing  Denies pain,anxiety,depression and SI/HI/VH. Endorsed AH but did not elaborate.     Problem: Mood Disorder  Goal: Reports improved mood  Outcome: Progressing  Mood is depressed,labile affect and a cooperative attitude     Problem: Loss of functioning (Thought Disorder, Mood Disturbance and/or Severe Anxiety) AS EVIDENCED BY...  Goal: Identify and participate in supportive program therapies  Outcome: Progressing  Pt encouraged to attend and participate in therapy groups.     Problem: At Risk for Suicide AS EVIDENCED BY...  Goal: Verbalizes understanding of medication, benefits, and side effects  Outcome: Progressing  Compliant with scheduled hs meds. Received Ibuprofen 400 mg po prn/pain with positive effects.      Received on the milieu, and pt remained recluse to self.A0X4 with appropriate behavior.Mood is depressed,labile affect and a cooperative attitude.Denies anxiety,depression and SI/HI/AVH.Received Ibuprofen 400 mg po prn/pain with positive effects.Pt slept well and maintained safety.

## 2019-03-18 NOTE — Plan of Care (Signed)
Problem: Thought Disorder  Goal: Verbalizes reduction in hallucinations/delusions  Outcome: Not Progressing     Problem: Severe Anxiety  Goal: Verbalizes reduced anxiety  Outcome: Not Progressing     Problem: At Risk for Suicide AS EVIDENCED BY...  Goal: Identify and participate in supportive program therapies  Outcome: Not Progressing     Problem: Pain  Goal: Pain at adequate level as identified by patient  Outcome: Progressing     Problem: Psychosocial and Spiritual Needs  Goal: Demonstrates ability to cope with hospitalization/illness  Outcome: Progressing     Problem: Loss of functioning (Thought Disorder, Mood Disturbance and/or Severe Anxiety) AS EVIDENCED BY...  Goal: Will identify long and short term goals based on individual needs and strengths  Outcome: Progressing  Goal: Will remain safe during hospitalization  Outcome: Progressing  Goal: Completes discharge safety and recovery plan  Outcome: Progressing     Problem: Mood Disorder  Goal: Reports improved mood  Outcome: Progressing     Problem: At Risk for Suicide AS EVIDENCED BY...  Goal: Will identify long and short term goals based on individual needs and strengths  Outcome: Progressing    Haizen was visible in the milieu talking on the phone without socializing with peers; mood was anxious/depressed, affect blunted and cooperative attitude. Compliant with his scheduled meds, goal for today is pray to Jesus and hope everything is ok. Pt denies SI/HI but endorses AH they telling me to do bad stuff like hurting myself. Encouraged coming to staff if feeling unsafe and pt. agreed, will continue to monitor for needs.

## 2019-03-18 NOTE — Plan of Care (Signed)
Problem: At Risk for Suicide AS EVIDENCED BY...  Goal: Identify and participate in supportive program therapies  Outcome: Not Progressing  Flowsheets (Taken 03/18/2019 1807)  Identify and participate in supportive program therapies:   Identify target number of therapies patient will attend daily as appropriate to functioning level   Encourage attendance and reinforce small successes in participation  Note: Therapist checked in with the patient 1:1 regarding patient's mood, goals for the day, and to educate the patent on different aspects of the day's routine and unit rules.  Patient was told the groups for the day, encouraged to reach out for 1:1's as needed, and was given unit rules, specifically emphasizing ones pertaining to the corona virus. Patient has not attended any groups thus far today and has rarely been seen outside the patient's room.

## 2019-03-18 NOTE — Progress Notes (Signed)
[name] MRN: [id number]  58 y.o.  male    NUTRITION:  Reason for assessment: Length of stay  100% po intake    Assessment   Past Medical History:   Diagnosis Date   . Arthritis    . Bipolar disorder    . PTSD (post-traumatic stress disorder)      Wt Readings from Last 30 Encounters:   03/15/19 113.1 kg (249 lb 4.8 oz)   03/11/19 104.5 kg (230 lb 6.1 oz)   01/05/18 109 kg (240 lb 4.8 oz)   12/29/17 104.3 kg (230 lb)     Social History     Socioeconomic History   . Marital status: Single     Spouse name: Not on file   . Number of children: Not on file   . Years of education: Not on file   . Highest education level: Not on file   Occupational History   . Not on file   Social Needs   . Financial resource strain: Not on file   . Food insecurity     Worry: Not on file     Inability: Not on file   . Transportation needs     Medical: Not on file     Non-medical: Not on file   Tobacco Use   . Smoking status: Never Smoker   . Smokeless tobacco: Never Used   . Tobacco comment: Pt denies tobacco use in the last 30 days   Substance and Sexual Activity   . Alcohol use: Never     Alcohol/week: 14.0 standard drinks     Types: 6 Glasses of wine, 6 Cans of beer, 2 Shots of liquor per week     Frequency: Never     Comment: No hx of alcohol use within the past 12 months   . Drug use: Never     Comment: No hx of drug use within tha past 12 months   . Sexual activity: Not Currently   Lifestyle   . Physical activity     Days per week: Not on file     Minutes per session: Not on file   . Stress: Not on file   Relationships   . Social Wellsite geologist on phone: Not on file     Gets together: Not on file     Attends religious service: Not on file     Active member of club or organization: Not on file     Attends meetings of clubs or organizations: Not on file     Relationship status: Not on file   . Intimate partner violence     Fear of current or ex partner: Not on file     Emotionally abused: Not on file     Physically  abused: Not on file     Forced sexual activity: Not on file   Other Topics Concern   . Not on file   Social History Narrative   . Not on file       Active Hospital Problems    Diagnosis   . Suicidal ideation   . Pyuria   . Balanitis     Allergies   Allergen Reactions   . Invega [Paliperidone]    . Lamictal [Lamotrigine]    . Penicillins      GI Symptoms: None  Skin: mild area of discoloration on medial right foot    Orders Placed This Encounter   Procedures   . Diet  regular     Current Meds:  ARIPiprazole, 15 mg, Daily  clotrimazole, , BID  hydrocortisone, , BID  hydrOXYzine, 50 mg, QHS  lactobacillus/streptococcus, 1 capsule, Daily  nystatin, , BID  sertraline, 50 mg, Daily        Recent Labs:  Recent Labs   Lab 03/12/19  0713   WBC 4.58   Hgb 14.0   Hematocrit 44.3   MCV 91.2   Platelets 204               Invalid input(s):  PREALB    Intake/Output Summary (Last 24 hours) at 03/18/2019 1306  Last data filed at 03/17/2019 1743  Gross per 24 hour   Intake 2000 ml   Output --   Net 2000 ml         Anthropometrics  Height: 190.5 cm (6\' 3" )  Weight: 113.1 kg (249 lb 4.8 oz)  Weight Change: -0.24  IBW/kg (Calculated) Male: 89.13 kg  IBW/kg (Calculated) Male: 79.51 kg  BMI (calculated): 31.3    Estimated Nutrition Needs:  Estimated Energy Needs  Total Energy Estimated Needs: 2300-2700 cal  Method for Estimating Needs: IBW x (25-30) cal    Estimated Protein Needs  Total Protein Estimated Needs: 91-109g  Method for Estimating Needs: IBW x (1-1.2)g    Estimated Carbohydrate Needs  Total Carbohydrate Estimated Needs: 288-337g  Method for Estimating Needs: 50% of caloric needs    Fluid Needs  Total Fluid Estimated Needs: 2730 ml  Method for Estimating Needs: IBW x 30 ml    Learning & Discharge Planning Needs: None  Religious/Cultural Food Practices: None    Nutrition Diagnosis:   No nutrition diagnosis    Intervention:  Continue nutrition plan    Goals:  Pt maintains 75% or more during LOS    M/E:  Monitor: po intake and  labs  Follow up 03/28/19    [name] [address]  03/18/19 @ 1309

## 2019-03-18 NOTE — Progress Notes (Signed)
PSYCHIATRY INPATIENT PROGRESS NOTE    Date/Time: 03/18/19 11:01 AM  Patient's Name: [name], [name]  MR#:: [id number]  DOB: 01-21-1961    Events leading to hospitalization:    Patient is a 58 y.o. African American male presents with for medication management for his self-reported bipolar and PTSD illnesses.     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 He became so overwhelmed at work and his coworkers brought him to the hospital because he was threatening to hurt himself.       Medications:   Current medications and doses:    Current Facility-Administered Medications   Medication Dose Route Frequency Provider Last Rate Last Dose    alum & mag hydroxide-simethicone (MAALOX PLUS) 200-200-20 mg/5 mL suspension 30 mL  30 mL Oral Q6H PRN Berneta Levins, MD        ARIPiprazole (ABILIFY) tablet 15 mg  15 mg Oral Daily Melynda Keller, MD   15 mg at 03/18/19 5409    clotrimazole (LOTRIMIN) 1 % cream   Topical BID Randal Buba, MD        diphenhydrAMINE (BENADRYL) capsule 50 mg  50 mg Oral Once PRN Berneta Levins, MD        Or    diphenhydrAMINE (BENADRYL) injection 50 mg  50 mg Intramuscular Once PRN Berneta Levins, MD        docusate sodium (COLACE) capsule 100 mg  100 mg Oral BID PRN Berneta Levins, MD        hydrocortisone (CORTAID) 1 % cream   Topical BID Elise Benne, San Jetty, MD        hydrOXYzine (VISTARIL) capsule 25 mg  25 mg Oral Q4H PRN Festus Aloe, MD   25 mg at 03/17/19 0911    hydrOXYzine (VISTARIL) capsule 50 mg  50 mg Oral QHS Jyl Heinz, MD   50 mg at 03/17/19 2150    ibuprofen (ADVIL) tablet 400 mg  400 mg Oral Q6H PRN Berneta Levins, MD   400 mg at 03/18/19 8119    lactobacillus/streptococcus (RISAQUAD) capsule 1 capsule  1 capsule Oral Daily Elise Benne San Jetty, MD   1 capsule at 03/17/19 1478    loperamide (IMODIUM) capsule 2 mg  2 mg Oral  Q3H PRN Berneta Levins, MD        nystatin (MYCOSTATIN) cream   Topical BID Elise Benne, San Jetty, MD        OLANZapine (ZyPREXA) tablet 10 mg  10 mg Oral BID PRN Berneta Levins, MD        ondansetron [facility]) tablet 4 mg  4 mg Oral Q8H PRN Berneta Levins, MD        QUEtiapine (SEROquel) tablet 50 mg  50 mg Oral QHS PRN Berneta Levins, MD        sertraline (ZOLOFT) tablet 50 mg  50 mg Oral Daily Melynda Keller, MD   50 mg at 03/18/19 2956       Legal Status:    Voluntarily     Subjective Findings:      Symptoms: Pt seen and evaluated in his room, reports that he still feels depressed with a little improvement, endorses SI, no specific plan and didn't contract for safety at home, takes his medications and denies any side effect, reports medications help with sleep and his ffels his mood is improving, was seen briefly on the  Unit watching  TV, pt reports that he likes to stay in his room to avoid people and trigger of his PTSD, states that he will attend the art session today. Continue to  endorse AH, but less severity and frequencity, denies HI/VH     Medication Side Effects:  Denies    Collateral History:   Medical chart   Family Involvement: not yet   Psychosocial Functioning: regressed   Attending Groups: not yet, interested in individual therapy, doesn't want to be in group due to his PTSD    Objective Findings:    depressive symptoms, suicidal ideations, isolative and avoidance behavioral     Mental Status Evaluation:     Appearance:  age appropriate and well dressed   Behavior:  normal and calm and cooperative    Speech:  normal pitch and normal volume   Mood:  depressed   Affect:  mood-congruent   Thought Process:  goal directed   Thought Content:  suicidal passive   Sensorium:  person, place, situation, month of year and year   Cognition:  grossly intact   Insight:  fair   Judgment:  fair       Neuro Vegetative Functions:      Energy: Energy level is good.  The patient is able to perform most  usual functions.   Sleep: Sleep pattern was reported as intact and regular with no initial, middle or late insomnia   Appetite: Appetite is reported to be intact, with no significant changes in weight.    Vital Signs:     Vitals:    03/18/19 0802   BP: 109/71   Pulse: 76   Resp: 16   Temp: 97.5 F (36.4 C)   SpO2: 97%        Laboratory Assessment:     Results     ** No results found for the last 24 hours. **           Assessment:   Clinical formulation:  Patient is a 58 y.o. African American male presents with for medication management for his self-reported bipolar and PTSD illnesses.     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 He became so overwhelmed at work and his coworkers brought him to the hospital because he was threatening to hurt himself. pt is poor compliant with his meds and doesn't regularly follow up with outpatient psychiatrist, pt has multiple hospitalization for similar penetrations, pt was made aware that best course of treatment for him id to continue taking his prescribed meds and regularly follow up with his outpatient psychiatrest    Diagnoses:      Axis I: Hypomanic bipolar II disorder r/o schizoaffective d/o    Axis [id number]   Axis III: See problem list in the medical record   Axis IV: Occupational problems   Other psychosocial or environmental problems   Problems with primary support group   Axis V:21-30: behavior considerably influenced by delusions or hallucinations OR serious impairment in judgment, communication OR inability to function in almost all areas.    Plan:     Medication Plan:   -Abilify 15mg  po daily for mood and psychosis   -Zoloft 50mg  po daily for mood and PTSD   -Vistaril 50mg  po daily at bed time     Medication Education: provided     Medical work-up plan/testing:  completed  Plan for Family Involvement:  not yet    Aftercare Plan: discharge  home with plan to follow up [facility]    Ongoing hospitalization required for safety and stabilization     Other Providers Contact Information and Dates Contacted: TBD    On this admission patient educated about and provided input into their treatment plan.  Patient understands potential risks and benefits of proposed treatment plan. -    Expected length of stay: 3-5 days   Total floor time: 35 min  At least 50% in coordination of care and counseling:Yes        Signed by: Melynda Keller, MD   03/18/19 11:01 AM

## 2019-03-19 NOTE — Plan of Care (Signed)
Problem: Pain  Goal: Pain at adequate level as identified by patient  Outcome: Not Progressing     Problem: Thought Disorder  Goal: Verbalizes reduction in hallucinations/delusions  Outcome: Not Progressing     Problem: Loss of functioning (Thought Disorder, Mood Disturbance and/or Severe Anxiety) AS EVIDENCED BY...  Goal: Identify and participate in supportive program therapies  Outcome: Not Progressing     Problem: Psychosocial and Spiritual Needs  Goal: Demonstrates ability to cope with hospitalization/illness  Outcome: Progressing     Problem: Loss of functioning (Thought Disorder, Mood Disturbance and/or Severe Anxiety) AS EVIDENCED BY...  Goal: Will identify long and short term goals based on individual needs and strengths  Outcome: Progressing  Goal: Will remain safe during hospitalization  Outcome: Progressing  Goal: Completes discharge safety and recovery plan  Outcome: Progressing     Problem: Mood Disorder  Goal: Reports improved mood  Outcome: Progressing     Problem: Severe Anxiety  Goal: Verbalizes reduced anxiety  Outcome: Progressing    [name] was received in his room reading the Bible, mood was depressed, affect blunted and calm cooperative attitude. Compliant with his scheduled meds, his goal for today is stay in peace, pray to God, so that I can get out of here. Pt. denies SI/HI but endorses AH they are slowing down, better than before, encouraged to let staff know when feeling unsafe. Safety maintains.

## 2019-03-19 NOTE — Plan of Care (Signed)
Patient has not attended any groups thus far today.   Will continue to encourage group attendance and participation in order to increase positive coping skills and express feelings regarding current situation.

## 2019-03-19 NOTE — Progress Notes (Signed)
PSYCHIATRY INPATIENT PROGRESS NOTE    Date/Time: 03/19/19 12:34 PM  Patient's Name: [name], [name]  MR#:: [id number]  DOB: Dec 24, 1960    Events leading to hospitalization:    Patient is a 58 y.o. African American male presents with for medication management for his self-reported bipolar and PTSD illnesses.     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 He became so overwhelmed at work and his coworkers brought him to the hospital because he was threatening to hurt himself.       Medications:   Current medications and doses:    Current Facility-Administered Medications   Medication Dose Route Frequency Provider Last Rate Last Dose    alum & mag hydroxide-simethicone (MAALOX PLUS) 200-200-20 mg/5 mL suspension 30 mL  30 mL Oral Q6H PRN Berneta Levins, MD        ARIPiprazole (ABILIFY) tablet 15 mg  15 mg Oral Daily Melynda Keller, MD   15 mg at 03/19/19 7829    clotrimazole (LOTRIMIN) 1 % cream   Topical BID Randal Buba, MD        diphenhydrAMINE (BENADRYL) capsule 50 mg  50 mg Oral Once PRN Berneta Levins, MD        Or    diphenhydrAMINE (BENADRYL) injection 50 mg  50 mg Intramuscular Once PRN Berneta Levins, MD        docusate sodium (COLACE) capsule 100 mg  100 mg Oral BID PRN Berneta Levins, MD        hydrocortisone (CORTAID) 1 % cream   Topical BID Elise Benne, San Jetty, MD        hydrOXYzine (VISTARIL) capsule 25 mg  25 mg Oral Q4H PRN Festus Aloe, MD   25 mg at 03/17/19 0911    hydrOXYzine (VISTARIL) capsule 50 mg  50 mg Oral QHS Jyl Heinz, MD   50 mg at 03/18/19 2125    ibuprofen (ADVIL) tablet 400 mg  400 mg Oral Q6H PRN Berneta Levins, MD   400 mg at 03/19/19 5621    lactobacillus/streptococcus (RISAQUAD) capsule 1 capsule  1 capsule Oral Daily Elise Benne San Jetty, MD   1 capsule at 03/17/19 3086    loperamide (IMODIUM) capsule 2 mg  2 mg Oral  Q3H PRN Berneta Levins, MD        nystatin (MYCOSTATIN) cream   Topical BID Elise Benne, San Jetty, MD        OLANZapine (ZyPREXA) tablet 10 mg  10 mg Oral BID PRN Berneta Levins, MD        ondansetron [facility]) tablet 4 mg  4 mg Oral Q8H PRN Berneta Levins, MD        QUEtiapine (SEROquel) tablet 50 mg  50 mg Oral QHS PRN Berneta Levins, MD        sertraline (ZOLOFT) tablet 50 mg  50 mg Oral Daily Melynda Keller, MD   50 mg at 03/19/19 5784       Legal Status:    Voluntarily     Subjective Findings:      Symptoms: Pt seen and evaluated in his room, reports that he feels much better and his mood is improving, believe medications are working well, also reports that his AH is better and less distressing,  Endorses very seldom SI no plan or intent, pt believes that he is ready to be discharged on Monday and back to his  work, feels that this hospitalization helped him organized and gathered his thoughts and mood,  denies HI/VH     Medication Side Effects:  Denies    Collateral History:   Medical chart   Family Involvement: not yet   Psychosocial Functioning: regressed   Attending Groups: not yet, interested in individual therapy, doesn't want to be in group due to his PTSD    Objective Findings:    depressive symptoms, suicidal ideations, all improved, pt presenetd with bright affect and future reiented    Mental Status Evaluation:     Appearance:  age appropriate and well dressed   Behavior:  normal and calm and cooperative    Speech:  normal pitch and normal volume   Mood:  "much better"   Affect:  mood-congruent and euthymic    Thought Process:  goal directed   Thought Content:  suicidal passive, no intent or plan   Sensorium:  person, place, situation, month of year and year   Cognition:  grossly intact   Insight:  fair   Judgment:  fair       Neuro Vegetative Functions:      Energy: Energy level is good.  The patient is able to perform most usual functions.   Sleep: Sleep pattern was reported as  intact and regular with no initial, middle or late insomnia   Appetite: Appetite is reported to be intact, with no significant changes in weight.    Vital Signs:     Vitals:    03/19/19 0720   BP: 107/67   Pulse: 87   Resp: 16   Temp: 97.9 F (36.6 C)   SpO2: 99%        Laboratory Assessment:     Results     ** No results found for the last 24 hours. **           Assessment:   Clinical formulation:  Patient is a 58 y.o. African American male presents with for medication management for his self-reported bipolar and PTSD illnesses.     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 He became so overwhelmed at work and his coworkers brought him to the hospital because he was threatening to hurt himself. pt is poor compliant with his meds and doesn't regularly follow up with outpatient psychiatrist, pt has multiple hospitalization for similar penetrations, pt was made aware that best course of treatment for him id to continue taking his prescribed meds and regularly follow up with his outpatient psychiatrest    Diagnoses:      Axis I: Hypomanic bipolar II disorder r/o schizoaffective d/o    Axis ZO:[id number]   Axis III: See problem list in the medical record   Axis IV: Occupational problems   Other psychosocial or environmental problems   Problems with primary support group   Axis V:21-30: behavior considerably influenced by delusions or hallucinations OR serious impairment in judgment, communication OR inability to function in almost all areas.    Plan:     Medication Plan:   -Abilify 15mg  po daily for mood and psychosis   -Zoloft 50mg  po daily for mood and PTSD   -Vistaril 50mg  po daily at bed time     Medication Education: provided     Medical work-up plan/testing:  completed     Plan for Family Involvement:  not yet  Aftercare Plan: discharge home with plan to [facility]    Ongoing hospitalization required for safety and stabilization     Other Providers Contact Information and Dates Contacted: TBD    On this admission patient educated about and provided input into their treatment plan.  Patient understands potential risks and benefits of proposed treatment plan. -    Expected length of stay: 3-5 days   Total floor time: 35 min  At least 50% in coordination of care and counseling:Yes        Signed by: Melynda Keller, MD   03/19/19 12:34 PM

## 2019-03-20 NOTE — Plan of Care (Signed)
Problem: At Risk for Suicide AS EVIDENCED BY...  Goal: Identify and participate in supportive program therapies  Outcome: Not Progressing  Flowsheets (Taken 03/20/2019 1901)  Identify and participate in supportive program therapies:   Identify target number of therapies patient will attend daily as appropriate to functioning level   Encourage attendance and reinforce small successes in participation  Note: Therapist checked in with the patient 1:1 regarding patient's mood, goals for the day, and to educate the patent on different aspects of the day's routine and unit rules.  Patient was told the groups for the day, encouraged to reach out for 1:1's as needed, and was given unit rules, specifically emphasizing ones pertaining to the corona virus. Patient has not attended groups thus far today, however the patient was observed in the milieu.

## 2019-03-20 NOTE — Progress Notes (Signed)
PSYCHIATRY INPATIENT PROGRESS NOTE    Date/Time: 03/20/19 12:22 PM  Patient's Name: [name], [name]  MR#:: [id number]  DOB: 02/28/1961    Events leading to hospitalization:    Patient is a 58 y.o. African American male presents with for medication management for his self-reported bipolar and PTSD illnesses.     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 He became so overwhelmed at work and his coworkers brought him to the hospital because he was threatening to hurt himself.       Medications:   Current medications and doses:    Current Facility-Administered Medications   Medication Dose Route Frequency Provider Last Rate Last Dose    alum & mag hydroxide-simethicone (MAALOX PLUS) 200-200-20 mg/5 mL suspension 30 mL  30 mL Oral Q6H PRN Berneta Levins, MD        ARIPiprazole (ABILIFY) tablet 15 mg  15 mg Oral Daily Melynda Keller, MD   15 mg at 03/20/19 1000    clotrimazole (LOTRIMIN) 1 % cream   Topical BID Randal Buba, MD        diphenhydrAMINE (BENADRYL) capsule 50 mg  50 mg Oral Once PRN Berneta Levins, MD        Or    diphenhydrAMINE (BENADRYL) injection 50 mg  50 mg Intramuscular Once PRN Berneta Levins, MD        docusate sodium (COLACE) capsule 100 mg  100 mg Oral BID PRN Berneta Levins, MD        hydrOXYzine (VISTARIL) capsule 25 mg  25 mg Oral Q4H PRN Festus Aloe, MD   25 mg at 03/17/19 5409    hydrOXYzine (VISTARIL) capsule 50 mg  50 mg Oral QHS Jyl Heinz, MD   50 mg at 03/19/19 2108    ibuprofen (ADVIL) tablet 400 mg  400 mg Oral Q6H PRN Berneta Levins, MD   400 mg at 03/20/19 8119    lactobacillus/streptococcus (RISAQUAD) capsule 1 capsule  1 capsule Oral Daily Randal Buba, MD   1 capsule at 03/17/19 1478    loperamide (IMODIUM) capsule 2 mg  2 mg Oral Q3H PRN Berneta Levins, MD        nystatin (MYCOSTATIN) cream   Topical BID Elise Benne,  San Jetty, MD        OLANZapine (ZyPREXA) tablet 10 mg  10 mg Oral BID PRN Berneta Levins, MD        ondansetron [facility]) tablet 4 mg  4 mg Oral Q8H PRN Berneta Levins, MD        QUEtiapine (SEROquel) tablet 50 mg  50 mg Oral QHS PRN Berneta Levins, MD        sertraline (ZOLOFT) tablet 50 mg  50 mg Oral Daily Melynda Keller, MD   50 mg at 03/20/19 0907       Legal Status:    Voluntarily     Subjective Findings:      Symptoms: Pt seen and evaluated in his room, reports that he feelsbetter and his mood is improving, reports that his symptoms well controlled with current believe medications, reports that he is ready to be discharged tomorrow, wants take his current medications Abilify and Zoloft, reports that his AH is better and less distressing, denies SI,HI/VH     Medication Side Effects:  Denies    Collateral History:   Medical chart   Family Involvement: not  yet   Psychosocial Functioning: regressed   Attending Groups: not yet, interested in individual therapy, doesn't want to be in group due to his PTSD    Objective Findings:    depressive symptoms, suicidal ideations, all improved, pt presenetd with bright affect and future reiented    Mental Status Evaluation:     Appearance:  age appropriate and well dressed   Behavior:  normal and calm and cooperative    Speech:  normal pitch and normal volume   Mood:  "good"   Affect:  mood-congruent and euthymic    Thought Process:  goal directed   Thought Content:  suicidal passive, no intent or plan   Sensorium:  person, place, situation, month of year and year   Cognition:  grossly intact   Insight:  fair   Judgment:  fair       Neuro Vegetative Functions:      Energy: Energy level is good.  The patient is able to perform most usual functions.   Sleep: Sleep pattern was reported as intact and regular with no initial, middle or late insomnia   Appetite: Appetite is reported to be intact, with no significant changes in weight.    Vital Signs:      Vitals:    03/20/19 0743   BP: 115/74   Pulse: 70   Resp: 16   Temp: 98.1 F (36.7 C)   SpO2: 97%        Laboratory Assessment:     Results     ** No results found for the last 24 hours. **           Assessment:   Clinical formulation:  Patient is a 58 y.o. African American male presents with for medication management for his self-reported bipolar and PTSD illnesses.     Mr. 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 He became so overwhelmed at work and his coworkers brought him to the hospital because he was threatening to hurt himself. pt is poor compliant with his meds and doesn't regularly follow up with outpatient psychiatrist, pt has multiple hospitalization for similar penetrations, pt was made aware that best course of treatment for him id to continue taking his prescribed meds and regularly follow up with his outpatient psychiatrest    Diagnoses:      Axis I: Hypomanic bipolar II disorder r/o schizoaffective d/o    Axis PI:RJJOA   Axis III: See problem list in the medical record   Axis IV: Occupational problems   Other psychosocial or environmental problems   Problems with primary support group   Axis V:21-30: behavior considerably influenced by delusions or hallucinations OR serious impairment in judgment, communication OR inability to function in almost all areas.    Plan:     Medication Plan:   -Abilify 15mg  po daily for mood and psychosis   -Zoloft 50mg  po daily for mood and PTSD   -Vistaril 50mg  po daily at bed time     Plan for discharge for Monday 03/21/2019    Medication Education: provided     Medical work-up plan/testing:  completed     Plan for Family Involvement:  not yet    Aftercare Plan: discharge home with plan to follow up [facility]    Ongoing hospitalization required for safety and stabilization     Other Providers Contact  Information and Dates  Contacted: TBD    On this admission patient educated about and provided input into their treatment plan.  Patient understands potential risks and benefits of proposed treatment plan. -    Expected length of stay: 3-5 days   Total floor time: 35 min  At least 50% in coordination of care and counseling:Yes        Signed by: Melynda Keller, MD   03/20/19 12:22 PM

## 2019-03-20 NOTE — Plan of Care (Addendum)
Problem: Pain  Goal: Pain at adequate level as identified by patient  Outcome: Progressing     Problem: Psychosocial and Spiritual Needs  Goal: Demonstrates ability to cope with hospitalization/illness  Outcome: Progressing     Problem: Loss of functioning (Thought Disorder, Mood Disturbance and/or Severe Anxiety) AS EVIDENCED BY...  Goal: Will identify long and short term goals based on individual needs and strengths  Outcome: Progressing  Goal: Will remain safe during hospitalization  Outcome: Progressing  Goal: Completes discharge safety and recovery plan  Outcome: Progressing     Problem: Thought Disorder  Goal: Verbalizes reduction in hallucinations/delusions  Outcome: Progressing     Problem: Mood Disorder  Goal: Reports improved mood  Outcome: Progressing     Problem: Severe Anxiety  Goal: Verbalizes reduced anxiety  Outcome: Progressing     Problem: Loss of functioning (Thought Disorder, Mood Disturbance and/or Severe Anxiety) AS EVIDENCED BY...  Goal: Identify and participate in supportive program therapies  Outcome: Progressing     Problem: At Risk for Suicide AS EVIDENCED BY...  Goal: Will identify long and short term goals based on individual needs and strengths  Outcome: Progressing  Goal: Patient will remain safe during hospitalization  Outcome: Progressing  Goal: Suicide Alert Level Moderate  Outcome: Progressing  Goal: Identifies triggers and protective strategies  Outcome: Progressing  Goal: Verbalizes understanding of medication, benefits, and side effects  Outcome: Progressing  Goal: Identify and participate in supportive program therapies  Outcome: Progressing  Goal: Completes discharge safety and recovery plan  Outcome: Progressing    Received [name] in the milieu isolated to self and watching TV with peers, pt. is alert and oriented X4. Mood was depressed, affect blunted and calm cooperative attitude, requested and received ibuprofen for pain 8/10 with positive effect. Goal for today is  pray to Jesus to go home safety tomorrow. Currently denies SI/HI but endorses AH, pt. is med compliant, eating and hydrating well. I will continue to monitor for needs and safety.

## 2019-03-20 NOTE — Plan of Care (Signed)
Problem: Loss of functioning (Thought Disorder, Mood Disturbance and/or Severe Anxiety) AS EVIDENCED BY...  Goal: Will identify long and short term goals based on individual needs and strengths  Outcome: Progressing  Dianne reports that the goal for today 03/19/2019 is to meet with my physician and ask questions about my medications .Goal achieved.    Goal: Will remain safe during hospitalization  Outcome: Progressing  Pt slept well and maintained safety.  Goal: Completes discharge safety and recovery plan  Outcome: Progressing  Ongoing treatment and medication adjustment is been made.     Problem: Thought Disorder  Goal: Verbalizes reduction in hallucinations/delusions  Outcome: Progressing  Denies,anxiety,depression and SI/HI/AVH.     Problem: Mood Disorder  Goal: Reports improved mood  Outcome: Progressing  Mood is depressed,blunted affect and a cooperative attitude     Problem: Loss of functioning (Thought Disorder, Mood Disturbance and/or Severe Anxiety) AS EVIDENCED BY...  Goal: Identify and participate in supportive program therapies  Outcome: Not Progressing  Pt encouraged to attend and participate in therapy groups.     Problem: At Risk for Suicide AS EVIDENCED BY...  Goal: Verbalizes understanding of medication, benefits, and side effects  Outcome: Progressing  Compliant with scheduled meds and received ibuprofen 400 mg po prn/pain with positive effects.    Received on the milieu,pt remained isolative to self ,watching TV with other peers. A0X4 with appropriate behavior.Mood is depressed,labile affect and a cooperative attitude.Denies anxiety,depression and SI/HI/AVH.Received Ibuprofen 400 mg po prn/pain with positive effects.Pt slept well and maintained safety.

## 2019-03-21 DIAGNOSIS — F251 Schizoaffective disorder, depressive type: Principal | ICD-10-CM

## 2019-03-21 DIAGNOSIS — F4310 Post-traumatic stress disorder, unspecified: Secondary | ICD-10-CM

## 2019-03-21 MED ORDER — SERTRALINE HCL 50 MG PO TABS
50.00 mg | ORAL_TABLET | Freq: Every day | ORAL | 1 refills | Status: AC
Start: 2019-03-22 — End: 2019-04-21

## 2019-03-21 MED ORDER — RISAQUAD PO CAPS
1.00 | ORAL_CAPSULE | Freq: Every day | ORAL | 1 refills | Status: AC
Start: 2019-03-22 — End: 2019-04-21

## 2019-03-21 MED ORDER — ARIPIPRAZOLE 15 MG PO TABS
15.00 mg | ORAL_TABLET | Freq: Every day | ORAL | 1 refills | Status: AC
Start: 2019-03-22 — End: 2019-04-21

## 2019-03-21 MED ORDER — CLOTRIMAZOLE 1 % EX CREA
TOPICAL_CREAM | Freq: Two times a day (BID) | CUTANEOUS | 1 refills | Status: DC
Start: 2019-03-21 — End: 2021-02-08

## 2019-03-21 NOTE — Discharge Summary (Signed)
PSYCHIATRY DISCHARGE SUMMARY  AND POST DISCHARGE CONTINUING CARE PLAN    Date/Time: 03/21/2019 1:15 PM  Patient Name: [name], [name]  MRN#: [id number]  Age: 58 y.o.   Date of Birth: 1961/02/28    Date of Admission:   03/12/2019  Date of Discharge:     03/21/2019  Admitting Physician:    Festus Aloe, MD  Discharge Physician:    Melynda Keller, MD     Event leading to hospitalization / Reason for Hospitalization   Chief Complaint or Reason for Admission     Legal Status on admission was voluntary.    Discharge Diagnoses:       Principal Discharge Diagnosis: Schizoaffective Type Depressive Type without Catatonia       Other  Psychiatric Diagnoses   Axis I    Post Traumatic Stress Disorder without Dissociative Symptoms   Axis II    Deferred   Axis III  History reviewed. No pertinent surgical history.   Axis IV   Problems related to the social environment   Housing problems   Problems with access to health care services   Axis V:  GAF at  Admission: 21-30: behavior considerably influenced by delusions or hallucinations OR serious impairment in judgment, communication OR inability to function in almost all areas.          GAF at Discharge:81-90: absent or minimal symptoms.    Comorbid Conditions: Alcohol use Disorder - current or past    Labs/Scans/EKG     Results     Procedure Component Value Units Date/Time    Hemoglobin A1C [[id number]]  (Abnormal) Collected: 03/14/19 0709    Specimen: Blood Updated: 03/14/19 0951     Hemoglobin A1C 4.5 %      Average Estimated Glucose 82.5 mg/dL     Narrative:      This is NOT the correct Test for Patients with  Hemoglobinopathy.    Lipid panel [[id number]] Collected: 03/12/19 0713    Specimen: Blood Updated: 03/12/19 1500     Cholesterol 152 mg/dL      Triglycerides 308 mg/dL      HDL 47 mg/dL      LDL Calculated 79 mg/dL      VLDL Calculated 26 mg/dL      Cholesterol / HDL Ratio 3.2    Hemolysis index [[id number]] Collected: 03/12/19 0713     Updated: 03/12/19 1500      Hemolysis Index 4    CBC and differential [[id number]]  (Abnormal) Collected: 03/12/19 0713    Specimen: Blood Updated: 03/12/19 0821     WBC 4.58 x10 3/uL      Hgb 14.0 g/dL      Hematocrit 40.1 %      Platelets 204 x10 3/uL      RBC 4.86 x10 6/uL      MCV 91.2 fL      MCH 28.8 pg      MCHC 31.6 g/dL      RDW 13 %      MPV 9.8 fL      Neutrophils 45.4 %      Lymphocytes Automated 31.7 %      Monocytes 8.7 %      Eosinophils Automated 13.1 %      Basophils Automated 0.9 %      Immature Granulocytes 0.2 %      Nucleated RBC 0.0 /100 WBC      Neutrophils Absolute 2.08 x10 3/uL      Lymphocytes Absolute Automated 1.45  x10 3/uL      Monocytes Absolute Automated 0.40 x10 3/uL      Eosinophils Absolute Automated 0.60 x10 3/uL      Basophils Absolute Automated 0.04 x10 3/uL      Immature Granulocytes Absolute 0.01 x10 3/uL      Absolute NRBC 0.00 x10 3/uL         No results found for any visits on 03/12/19.  Cardiology Results     None          Consults:     Consult Orders (From admission, onward)    None        None.    Discharge Day Evaluation     Blood pressure 109/69, pulse 85, temperature 97.9 F (36.6 C), temperature source Oral, resp. rate 16, height 1.905 m (6\' 3" ), weight 113.1 kg (249 lb 4.8 oz), SpO2 94 %.    Mental Status Exam    General appearance: Appears chronological age, Good hygiene and Good grooming  Attitude/Behavior: Calm and Cooperative  Motor: No abnormalities noted  Gait: No obvious abnormalities  Muscle strength and tone: Grossly intact  Speech:   Spontaneous: Yes  Rate and Rhythm: Normal  Volume: Normal  Tone: Normal  Clarity: Yes  Mood: "I feel well and ready to go home and back to work"  Affect:   Range: Full  Stability: Stable  Appropriateness to thought content: Yes  Intensity: Normal  Thought Process:   Coherent: Yes  Logical:  Yes  Associations: Goal-directed  Thought Content:   Delusions:  Not elicited  Depressive Cognitions:  None  Suicidal:  No suicidal thoughts  Homicidal:  No  homicidal thoughts  Violent Thoughts:  No  Perceptions:   Dissociative Phenomena:  No  Hallucinations: Yes, auditory  Illusions:  No  Insight: Fair  Judgment: Fair  Cognition:       Level of Consciousness: Intact  Orientation: Intact to self, place and time  Recent Memory: Intact  Remote Memory: Intact  Attention and Concentration: Intact  Language: Repetition: Intact  Recognition: Intact  Fund of Knowledge: Good    Review of Systems  A complete 14 point ROS was done and was negative except for  Psychiatric: chrinic AH, not distressing    Hospital Course:   Events leading to hospitalization:    Patient is a58 y.o.African Americanmalepresents with for medication management for his self-reported bipolar and PTSD illnesses.     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 He became so overwhelmed at work and his coworkers brought him to the hospital because he was threatening to hurt himself.    On the unit, pt stayed in his room most of the time due to PTSD symptoms, reported he cannot be in a large gathering.  On presentation patient was depressed, endorsed commanding distressing auditory hallucinations in context of not being on his medication, patient did not contract for safety and endorsed suicidal ideation with plan.  Patient was started on Abilify 5 mg that was creased to 10 mg for mood and psychosis, then Zoloft 50 mg were added for mood, patient reported that his mood improved and his auditory hallucination was less in terms of severity, frequency, and was not distressing anymore, but continue to endorses passive suicidal ideation.  Patient was able to attend group session and was visible on the unit watching TV in few occasions.  On discharge day patient was seen in his room with bright affect, calm and cooperative, reported that his is mood much better, denies feeling depressed, denies  suicidal ideation, reports that his auditory hallucination is better and manageable, was happy with the current combination of Abilify and Zoloft and he stated that he would like to continue to take after discharge.  Patient will [facility] in [place] and declined to be connected to another program.    Suicidal and Homicidal Status on Discharge:   Patient denies suicidal and homicidal ideation, intent and plan.    Discharge Instructions:   Discharge Disposition: Home - Self Care    Discharge Instructions given to: Patient    Questions that may arise between hospital discharge and your first follow-up appointment should be directed to [facility] in [place]     Discharge Plan:     Follow-up Information     Pcp, None, MD .                 Attestation:   The patient has been seen and evaluated by me,  Melynda Keller, MD    Patient discharged on more than 1 antipsychotic medication? No  On discharge, was the patient on any psychotropic medication off label? No  I spent more than 30 minutes coordinating the discharge and reviewing the discharge plan.    Discharge Medications:     Tobacco Cessation Discharge Prescription for Cessation Medication  Patient was assessed on admission and found not to be a tobacco user       Discharge Medication List      Taking    ARIPiprazole 15 MG tablet  Dose: 15 mg  What changed:    medication strength   how much to take   when to take this   additional instructions  Commonly known as: ABILIFY  Start taking on: March 22, 2019  Take 1 tablet (15 mg total) by mouth daily For mood/psychosis     clotrimazole 1 % cream  Commonly known as: LOTRIMIN  Apply topically 2 (two) times daily     folic acid 1 MG tablet  Dose: 1 mg  Commonly known as: FOLVITE  For: Anemia From Inadequate Folic Acid  Take 1 tablet (1 mg total) by mouth daily     hydrocortisone 1 % cream  Commonly known as: CORTAID  For: Inflammation  Apply topically 2 (two) times  daily     hydrOXYzine 25 MG capsule  Dose: 25 mg  Commonly known as: VISTARIL  For: Feeling Anxious  Take 1 capsule (25 mg total) by mouth 2 (two) times daily as needed for Itching or Anxiety     hydrOXYzine 25 MG tablet  Commonly known as: ATARAX  TAKE ONE(1) Tablet BY MOUTH 3 TIMES DAILY     ibuprofen 400 MG tablet  Dose: 800 mg  Commonly known as: ADVIL  For: Rheumatoid Arthritis  Take 2 tablets (800 mg total) by mouth 2 (two) times daily as needed for Pain     lactobacillus/streptococcus Caps  Dose: 1 capsule  Start taking on: March 22, 2019  Take 1 capsule by mouth daily Supplement     multivitamin Tabs  Dose: 1 tablet  For: supplements  Take 1 tablet by mouth daily     naltrexone 50 MG tablet  Dose: 50 mg  Commonly known as: REVIA  For: Excessive Use of Alcohol  Take 1 tablet (50  mg total) by mouth daily     nystatin powder  Commonly known as: NYSTOP  For: Skin Infection due to Candida Yeast  Apply topically 2 (two) times daily     sertraline 50 MG tablet  Dose: 50 mg  Commonly known as: ZOLOFT  Start taking on: March 22, 2019  Take 1 tablet (50 mg total) by mouth daily For mood/deprssion     thiamine 100 MG tablet  Dose: 100 mg  Commonly known as: B-1  For: Deficiency of Vitamin B1  Take 1 tablet (100 mg total) by mouth daily        STOP taking these medications    LURASIDONE HCL PO              Signed by: Melynda Keller, MD   03/21/2019  1:15 PM

## 2019-03-21 NOTE — Plan of Care (Signed)
Problem: Pain  Goal: Pain at adequate level as identified by patient  Outcome: Progressing  Flowsheets (Taken 03/21/2019 0509)  Pain at adequate level as identified by patient:   Consult/collaborate with Physical Therapy, Occupational Therapy, and/or Speech Therapy   Evaluate patient's satisfaction with pain management progress   Evaluate if patient comfort function goal is met   Offer non-pharmacological pain management interventions   Consult/collaborate with Pain Service   Include patient/patient care companion in decisions related to pain management as needed     Problem: Severe Anxiety  Goal: Verbalizes reduced anxiety  Outcome: Progressing  Flowsheets (Taken 03/21/2019 0509)  Verbalizes reduced anxiety:   Assist to identify signs/symptoms of anxiety   Educate about stress reduction techniques and healthy coping responses   Facilitate expression of feelings, fears, concerns, anxiety     Problem: At Risk for Suicide AS EVIDENCED BY...  Goal: Will identify long and short term goals based on individual needs and strengths  Outcome: Progressing  Flowsheets (Taken 03/21/2019 0509)  Pt will identify long and short term treatment goals based on individual needs and strengths:   Assist to identify goals for treatment based on individual needs and strengths   Assist patient to define criteria for discharge   Assist patient to sign acknowledgement of treatment plan participation   [name] calm, cooperative and denies anxiety, depression  and SI/HI. A/Ox4, depressed mood, blunted affect, and a cooperative attitude. appropriate, isolative to self. PRN : Received Ibuprofen 400 mg po prn/pain with good effects . Will continue to monitor for safety and needs.

## 2019-03-21 NOTE — Progress Notes (Addendum)
Discharge _10/26__/2020__: Discharge order given. No visible distress or psychotic episode noted prior to discharge. Denied any SI/HI/AVH, discomfort including pain. Instructions provided along with appointments and medications explained to pt. Belongings and medications returned and completed inpatient survey and discharge safety plan. Pt refused to take his nystatin cream on discharge.  Pt d/c to home accompanied by staff to taxi around 1255.

## 2019-03-21 NOTE — Plan of Care (Addendum)
Problem: Pain  Goal: Pain at adequate level as identified by patient  Outcome: Completed     Problem: Psychosocial and Spiritual Needs  Goal: Demonstrates ability to cope with hospitalization/illness  Outcome: Completed     Problem: Loss of functioning (Thought Disorder, Mood Disturbance and/or Severe Anxiety) AS EVIDENCED BY...  Goal: Will identify long and short term goals based on individual needs and strengths  Outcome: Completed  Goal: Will remain safe during hospitalization  Outcome: Completed  Goal: Completes discharge safety and recovery plan  Outcome: Completed     Problem: Thought Disorder  Goal: Verbalizes reduction in hallucinations/delusions  Outcome: Completed     Problem: Mood Disorder  Goal: Reports improved mood  Outcome: Completed     Problem: Severe Anxiety  Goal: Verbalizes reduced anxiety  Outcome: Completed     Problem: Loss of functioning (Thought Disorder, Mood Disturbance and/or Severe Anxiety) AS EVIDENCED BY...  Goal: Identify and participate in supportive program therapies  Outcome: Completed     Problem: At Risk for Suicide AS EVIDENCED BY...  Goal: Will identify long and short term goals based on individual needs and strengths  Outcome: Completed  Goal: Patient will remain safe during hospitalization  Outcome: Completed  Goal: Suicide Alert Level Moderate  Outcome: Completed  Goal: Identifies triggers and protective strategies  Outcome: Completed  Goal: Verbalizes understanding of medication, benefits, and side effects  Outcome: Completed  Goal: Identify and participate in supportive program therapies  Outcome: Completed  Goal: Completes discharge safety and recovery plan  Outcome: Completed      [name] was received in the milieu in a calm and cooperative attitude. A/O x 4, with depressed and appropriate behavior. Denied and SI/HI/AVH, pain and anxiety. Patient is discharged today to home . Medication compliant and maintained safety.

## 2019-03-21 NOTE — Discharge Instr - AVS First Page (Addendum)
Instructions for after your discharge:      [facility] Continuing Care Plan, including all 11 elements of the After Visit Summary (AVS)  were  faxed, mailed, routed electronically - or can be viewed by all [facility] providers  who have access to Epic-  on  Today, your discharge date, March 21, 2019   To_ [facility] @ fax # [phone].    RECOMMENDED THAT YOU COMPLY WITH THE FOLLOWING PLAN: Discharge today, March 21, 2019   Follow up appointment with:     [facility]     , You will call to schedule an appointment at the number listed , [phone]  Location:   [facility]                    [address] [place], [place]  [address]  P: [phone]  F: [phone]              2. See "What's Next" below for tobacco cessation appointment with [facility].           FOR EMERGENCY MENTAL HEALTH, CONTACT: _911_    National Suicide Prevention Lifeline :  [phone]      The following were reviewed with patient/family by _Nonye_RN.    1. Reason for IP admission:   Diagnosis: __Hypomanic bipolar II disorder r/o schizoaffective d/o     Precipitating event:_Non compliant with medications and feelings of self harm    2. Major procedures and tests, including summary of results : None  3. Diagnosis at discharge : Hypomanic bipolar II disorder r/o schizoaffective d/o  4. Current medication list : See AVS  5. Were studies pending at discharge?           No                                                                                                                                                      6. Patient instructions;  Adult Wellness, Recovery, and Safety Plan : Revised with patient  7. Emergency contact information  8. Plan for follow up care : See AVS  9. Name of provider(s), appointment(s), and location(s) of follow up care : See AVS                                              Advance Care  Plan  10. Patient had a medical and mental health Advance Directive at admission.  No         11. If patient did not have a medical and mental health Advance Directive at admission:  information about completing Advance Directives or designating a surrogate decision maker, and a form, was provided.  After receiving information- Did patient create a medical and mental health Advance Directive or appoint a surrogate decision maker?                                                                                                       No      If No:   12. If no, what is the reason?   ( Check reason)                 _____  Prefer to wait until I feel better        _____  Prefer to discuss with family or significant other        __X___  Prefer to discuss with continuing care provider        _____  Do not believe I will need a mental health Advance Directive        _____  Other_____________________________________________

## 2019-12-17 ENCOUNTER — Inpatient Hospital Stay (HOSPITAL_BASED_OUTPATIENT_CLINIC_OR_DEPARTMENT_OTHER): Payer: Self-pay

## 2019-12-17 ENCOUNTER — Emergency Department
Admission: EM | Admit: 2019-12-17 | Discharge: 2019-12-18 | Disposition: A | Discharge status: Other Acute Inpatient Hospital | Payer: Medicaid HMO | Attending: Emergency Medicine | Admitting: Emergency Medicine

## 2019-12-17 DIAGNOSIS — F319 Bipolar disorder, unspecified: Secondary | ICD-10-CM | POA: Insufficient documentation

## 2019-12-17 DIAGNOSIS — Z008 Encounter for other general examination: Secondary | ICD-10-CM

## 2019-12-17 DIAGNOSIS — F4310 Post-traumatic stress disorder, unspecified: Secondary | ICD-10-CM | POA: Insufficient documentation

## 2019-12-17 DIAGNOSIS — T50996A Underdosing of other drugs, medicaments and biological substances, initial encounter: Secondary | ICD-10-CM | POA: Insufficient documentation

## 2019-12-17 DIAGNOSIS — Z91148 Patient's other noncompliance with medication regimen for other reason: Secondary | ICD-10-CM

## 2019-12-17 DIAGNOSIS — Z91128 Patient's intentional underdosing of medication regimen for other reason: Secondary | ICD-10-CM | POA: Insufficient documentation

## 2019-12-17 DIAGNOSIS — Z20822 Contact with and (suspected) exposure to covid-19: Secondary | ICD-10-CM | POA: Insufficient documentation

## 2019-12-17 DIAGNOSIS — Z7289 Other problems related to lifestyle: Secondary | ICD-10-CM

## 2019-12-17 DIAGNOSIS — Z915 Personal history of self-harm: Secondary | ICD-10-CM | POA: Insufficient documentation

## 2019-12-17 DIAGNOSIS — R45851 Suicidal ideations: Secondary | ICD-10-CM | POA: Insufficient documentation

## 2019-12-17 DIAGNOSIS — F315 Bipolar disorder, current episode depressed, severe, with psychotic features: Secondary | ICD-10-CM

## 2019-12-17 DIAGNOSIS — F411 Generalized anxiety disorder: Secondary | ICD-10-CM

## 2019-12-17 DIAGNOSIS — Z8659 Personal history of other mental and behavioral disorders: Secondary | ICD-10-CM

## 2019-12-17 LAB — CBC AND DIFFERENTIAL
Absolute NRBC: 0 10*3/uL (ref 0.00–0.00)
Basophils Absolute Automated: 0.04 10*3/uL (ref 0.00–0.08)
Basophils Automated: 0.9 %
Eosinophils Absolute Automated: 0.49 10*3/uL — ABNORMAL HIGH (ref 0.00–0.44)
Eosinophils Automated: 11.5 %
Hematocrit: 43.5 % (ref 37.6–49.6)
Hgb: 14 g/dL (ref 12.5–17.1)
Immature Granulocytes Absolute: 0.01 10*3/uL (ref 0.00–0.07)
Immature Granulocytes: 0.2 %
Lymphocytes Absolute Automated: 1.5 10*3/uL (ref 0.42–3.22)
Lymphocytes Automated: 35.1 %
MCH: 29.1 pg (ref 25.1–33.5)
MCHC: 32.2 g/dL (ref 31.5–35.8)
MCV: 90.4 fL (ref 78.0–96.0)
MPV: 9.3 fL (ref 8.9–12.5)
Monocytes Absolute Automated: 0.4 10*3/uL (ref 0.21–0.85)
Monocytes: 9.4 %
Neutrophils Absolute: 1.83 10*3/uL (ref 1.10–6.33)
Neutrophils: 42.9 %
Nucleated RBC: 0 /100 WBC (ref 0.0–0.0)
Platelets: 217 10*3/uL (ref 142–346)
RBC: 4.81 10*6/uL (ref 4.20–5.90)
RDW: 12 % (ref 11–15)
WBC: 4.27 10*3/uL (ref 3.10–9.50)

## 2019-12-17 LAB — COMPREHENSIVE METABOLIC PANEL
ALT: 14 U/L (ref 0–55)
AST (SGOT): 16 U/L (ref 5–34)
Albumin/Globulin Ratio: 1 (ref 0.9–2.2)
Albumin: 3.9 g/dL (ref 3.5–5.0)
Alkaline Phosphatase: 73 U/L (ref 38–106)
Anion Gap: 8 (ref 5.0–15.0)
BUN: 16 mg/dL (ref 9.0–28.0)
Bilirubin, Total: 0.6 mg/dL (ref 0.2–1.2)
CO2: 26 mEq/L (ref 22–29)
Calcium: 9.2 mg/dL (ref 8.5–10.5)
Chloride: 105 mEq/L (ref 100–111)
Creatinine: 1.2 mg/dL (ref 0.7–1.3)
Globulin: 3.8 g/dL — ABNORMAL HIGH (ref 2.0–3.6)
Glucose: 88 mg/dL (ref 70–100)
Potassium: 4.1 mEq/L (ref 3.5–5.1)
Protein, Total: 7.7 g/dL (ref 6.0–8.3)
Sodium: 139 mEq/L (ref 136–145)

## 2019-12-17 LAB — URINALYSIS REFLEX TO MICROSCOPIC EXAM - REFLEX TO CULTURE
Bilirubin, UA: NEGATIVE
Glucose, UA: NEGATIVE
Ketones UA: NEGATIVE
Nitrite, UA: NEGATIVE
Protein, UR: NEGATIVE
Specific Gravity UA: 1.015 (ref 1.001–1.035)
Urine pH: 6 (ref 5.0–8.0)
Urobilinogen, UA: NEGATIVE mg/dL (ref 0.2–2.0)

## 2019-12-17 LAB — ACETAMINOPHEN LEVEL: Acetaminophen Level: <7 ug/mL — ABNORMAL LOW (ref 10–30)

## 2019-12-17 LAB — COVID-19 (SARS-COV-2): SARS CoV 2 Overall Result: NEGATIVE

## 2019-12-17 LAB — RAPID DRUG SCREEN, URINE
Barbiturate Screen, UR: NEGATIVE
Benzodiazepine Screen, UR: NEGATIVE
Cannabinoid Screen, UR: NEGATIVE
Cocaine, UR: NEGATIVE
Opiate Screen, UR: NEGATIVE
PCP Screen, UR: NEGATIVE
Urine Amphetamine Screen: NEGATIVE

## 2019-12-17 LAB — ETHANOL: Alcohol: NOT DETECTED mg/dL

## 2019-12-17 LAB — GFR: EGFR: >60

## 2019-12-17 LAB — SALICYLATE LEVEL: Salicylate Level: <5 mg/dL — ABNORMAL LOW (ref 15.0–30.0)

## 2019-12-17 LAB — HEMOLYSIS INDEX: Hemolysis Index: 5 (ref 0–18)

## 2019-12-17 MED ORDER — IBUPROFEN 600 MG PO TABS
600.0000 mg | ORAL_TABLET | Freq: Once | ORAL | Status: AC
Start: 2019-12-17 — End: 2019-12-17
  Administered 2019-12-17: 20:00:00 600 mg via ORAL
  Filled 2019-12-17: qty 1

## 2019-12-17 NOTE — ED Triage Notes (Signed)
EMERGENCY DEPARTMENT PIT NOTE    Patient Name: [name] has had a rapid medical screening evaluation initiated by myself for the chief complaint of racing thoughts and feeling manic. He has history of Bipolar and PTSD. He is in from out of town and has ran out of his medication. He states he fees like cutting himself and so instead of doing that he came to the ER for help.    Vitals: BP 126/80    Pulse 85    Temp 98.4 F (36.9 C) (Oral)    Resp 16    Ht 6\' 3"  (1.905 m)    Wt 108.1 kg    SpO2 98%    BMI 29.79 kg/m   Pertinent brief exam: well appearing, alert and oriented.   Prelim orders: psych evaluation orders    Patient advised to remain in the ED until further evaluation can be performed. Patient instructed to notify staff of any changes in condition while waiting.    I am not the sole provider and this assessment is only an initial evaluation prior to full evaluation to expedite care.

## 2019-12-17 NOTE — ED Triage Notes (Signed)
[name] is a 59 y.o. male presents to the ED for psychiatric evaluation. Pt. Is seeking help due to his suicidal thoughts. Pt. Reports that his psych medication has stopped working and his last doses were last Tuesday. Pt. Reports having a plan to "cut his throat". Pt. Didn't act on anything .  BP 126/80    Pulse 85    Temp 98.4 F (36.9 C) (Oral)    Resp 16    Ht 6\' 3"  (1.905 m)    Wt 108.1 kg    SpO2 98%    BMI 29.79 kg/m

## 2019-12-17 NOTE — ED Notes (Signed)
Sitter request form faxed to [employer].
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
## 2019-12-18 MED ORDER — IBUPROFEN 600 MG PO TABS
600.0000 mg | ORAL_TABLET | Freq: Once | ORAL | Status: AC
Start: 2019-12-18 — End: 2019-12-18
  Administered 2019-12-18: 10:00:00 600 mg via ORAL
  Filled 2019-12-18: qty 1

## 2019-12-18 NOTE — ED Notes (Signed)
47: TW Spoke with Sadeja at [facility]. The pt has been accepted to [facility] by Dr. Park Breed. Pt can come after 9 am and nurse to nurse number is [phone]    [place] Medicaid office does not open until 8:00 am, PTS and RN2RN can be completed after the prior auth is complete.

## 2019-12-18 NOTE — ED Notes (Signed)
Report given to Riley Lam, RN at [facility]. Pt will be going to unit 6.

## 2019-12-18 NOTE — ED Notes (Signed)
Attempted to call report to the number provided, [phone]. Person who answered told me to call front desk #[phone].

## 2019-12-18 NOTE — ED Notes (Addendum)
Sent a message to Ms. Calandria regarding pt transport. Was told to wait due to Medicaid office opening at 8am.

## 2019-12-18 NOTE — ED Notes (Signed)
Patient had [place] Medicaid so bed placement is limited to [place] facilities. Writer had conducted bed search in the [place] area and unable to confirm bed placement at this time.    [facility] [phone]):   Contact Person: n/a            Time Contacted:  0110     Status: Left a voicemail    [facility] [phone]):   Contact Person: [place] Citizen            Time Contacted:  0112     Status: Patient's clinicals faxed but may not be reviewed not until tomorrow after 6:00 am    [facility] [phone]):   Contact Person: n/a         Time Contacted:  0114     Status: Left a voicemail    [facility] [phone]):   Contact Person: n/a            Time Contacted:  0116     Status: Left a voicemail      [facility] [phone]):   Contact Person: Kimra            Time Contacted:  0120     Status: Patient's clinicals faxed for review but may not be reviewed not until tomorrow morning.

## 2019-12-19 LAB — ECG 12-LEAD
Atrial Rate: 74 {beats}/min
P Axis: 49 degrees
P-R Interval: 148 ms
Q-T Interval: 392 ms
QRS Duration: 86 ms
QTC Calculation (Bezet): 435 ms
R Axis: 50 degrees
T Axis: 56 degrees
Ventricular Rate: 74 {beats}/min

## 2021-01-28 ENCOUNTER — Emergency Department
Admission: EM | Admit: 2021-01-28 | Discharge: 2021-01-30 | Disposition: A | Discharge status: Other Acute Inpatient Hospital | Payer: Medicaid Managed Care Other | Attending: Emergency Medical Services | Admitting: Emergency Medical Services

## 2021-01-28 DIAGNOSIS — Z20822 Contact with and (suspected) exposure to covid-19: Secondary | ICD-10-CM | POA: Insufficient documentation

## 2021-01-28 DIAGNOSIS — N390 Urinary tract infection, site not specified: Secondary | ICD-10-CM | POA: Insufficient documentation

## 2021-01-28 DIAGNOSIS — F319 Bipolar disorder, unspecified: Secondary | ICD-10-CM | POA: Insufficient documentation

## 2021-01-28 LAB — CBC AND DIFFERENTIAL
Absolute NRBC: 0 10*3/uL (ref 0.00–0.00)
Basophils Absolute Automated: 0.03 10*3/uL (ref 0.00–0.08)
Basophils Automated: 0.4 %
Eosinophils Absolute Automated: 0.53 10*3/uL — ABNORMAL HIGH (ref 0.00–0.44)
Eosinophils Automated: 6.7 %
Hematocrit: 43.4 % (ref 37.6–49.6)
Hgb: 14 g/dL (ref 12.5–17.1)
Immature Granulocytes Absolute: 0.02 10*3/uL (ref 0.00–0.07)
Immature Granulocytes: 0.3 %
Lymphocytes Absolute Automated: 1.3 10*3/uL (ref 0.42–3.22)
Lymphocytes Automated: 16.5 %
MCH: 29 pg (ref 25.1–33.5)
MCHC: 32.3 g/dL (ref 31.5–35.8)
MCV: 90 fL (ref 78.0–96.0)
MPV: 9.5 fL (ref 8.9–12.5)
Monocytes Absolute Automated: 0.71 10*3/uL (ref 0.21–0.85)
Monocytes: 9 %
Neutrophils Absolute: 5.29 10*3/uL (ref 1.10–6.33)
Neutrophils: 67.1 %
Nucleated RBC: 0 /100 WBC (ref 0.0–0.0)
Platelets: 171 10*3/uL (ref 142–346)
RBC: 4.82 10*6/uL (ref 4.20–5.90)
RDW: 13 % (ref 11–15)
WBC: 7.88 10*3/uL (ref 3.10–9.50)

## 2021-01-28 LAB — GFR: EGFR: >60

## 2021-01-28 LAB — ACETAMINOPHEN LEVEL: Acetaminophen Level: <7 ug/mL — ABNORMAL LOW (ref 10–30)

## 2021-01-28 LAB — COMPREHENSIVE METABOLIC PANEL
ALT: 22 U/L (ref 0–55)
AST (SGOT): 26 U/L (ref 5–34)
Albumin/Globulin Ratio: 0.9 (ref 0.9–2.2)
Albumin: 3.5 g/dL (ref 3.5–5.0)
Alkaline Phosphatase: 85 U/L (ref 37–117)
Anion Gap: 9 (ref 5.0–15.0)
BUN: 17 mg/dL (ref 9.0–28.0)
Bilirubin, Total: 0.9 mg/dL (ref 0.2–1.2)
CO2: 27 mEq/L (ref 22–29)
Calcium: 9.1 mg/dL (ref 8.5–10.5)
Chloride: 105 mEq/L (ref 100–111)
Creatinine: 1 mg/dL (ref 0.7–1.3)
Globulin: 3.8 g/dL — ABNORMAL HIGH (ref 2.0–3.6)
Glucose: 104 mg/dL — ABNORMAL HIGH (ref 70–100)
Potassium: 3.9 mEq/L (ref 3.5–5.1)
Protein, Total: 7.3 g/dL (ref 6.0–8.3)
Sodium: 141 mEq/L (ref 136–145)

## 2021-01-28 LAB — COVID-19 (SARS-COV-2): SARS CoV 2 Overall Result: NOT DETECTED

## 2021-01-28 LAB — MAGNESIUM: Magnesium: 2.2 mg/dL (ref 1.6–2.6)

## 2021-01-28 LAB — ETHANOL: Alcohol: NOT DETECTED mg/dL

## 2021-01-28 LAB — SALICYLATE LEVEL: Salicylate Level: <5 mg/dL — ABNORMAL LOW (ref 15.0–30.0)

## 2021-01-28 NOTE — EDIE (Signed)
COLLECTIVE?NOTIFICATION?01/28/2021 20:09?[name], [name]?MRN: [id number]    Criteria Met      History of Suicide Ideation or Self-Harm (12 mo.)    Security and Safety  No Security Events were found.  ED Care Guidelines  There are currently no ED Care Guidelines for this patient. Please check your facility's medical records system.        Prescription Monitoring Program  060??- Narcotic Use Score  150??- Sedative Use Score  000??- Stimulant Use Score  150??- Overdose Risk Score  - All Scores range from 000-999 with 75% of the population scoring < 200 and on 1% scoring above 650  - The last digit of the narcotic, sedative, and stimulant score indicates the number of active prescriptions of that type  - Higher Use scores correlate with increased prescribers, pharmacies, mg equiv, and overlapping prescriptions  - Higher Overdose Risk Scores correlate with increased risk of unintentional overdose death   Concerning or unexpectedly high scores should prompt a review of the PMP record; this does not constitute checking PMP for prescribing purposes.    E.D. Visit Count (12 mo.)  Facility Visits   [facility] 3   [facility] 1   Total 4   Note: Visits indicate total known visits.     Recent Emergency Department Visit Summary  Date Facility [facility] Type Diagnoses or Chief Complaint    Jan 28, 2021  [facility]  [place].  [place]  Emergency      Mental Illness- Suicidal      Jan 27, 2021  [facility]  [place].  [place]  Emergency      Psych Eval      Depression      Suicidal ideations      Urinary tract infection, site not specified      Sep 13, 2020  [facility]  [place].  [place]  Emergency      Eval      Psychiatric Evaluation      Suicidal ideations      Cocaine abuse, uncomplicated      Bipolar disorder, current episode manic without psychotic features, moderate      Jun 08, 2020  [facility]  [place].  [place]  Emergency      SI; Eval       Suicidal        Recent Inpatient Visit Summary  No Recent Inpatient Visits were found.  Care Team  No Care Team was found.  Collective Portal  This patient has registered at the [facility] Emergency Department   For more information visit: [email or web address]     PLEASE NOTE:     1.   Any care recommendations and other clinical information are provided as guidelines or for historical purposes only, and providers should exercise their own clinical judgment when providing care.    2.   You may only use this information for purposes of treatment, payment or health care operations activities, and subject to the limitations of applicable Collective Policies.    3.   You should consult directly with the organization that provided a care guideline or other clinical history with any questions about additional information or accuracy or completeness of information provided.    ? 2022 [place] - [email or web address]

## 2021-01-28 NOTE — ED Notes (Signed)
Belonging collected and secured with security

## 2021-01-28 NOTE — ED Provider Notes (Signed)
EMERGENCY DEPARTMENT HISTORY AND PHYSICAL EXAM        Date: 01/28/2021  Patient Name: [name]    History of Presenting Illness     Chief Complaint   Patient presents with    Suicidal    Sore Throat    UTI       History Provided By: pt    Chief Complaint: feeling suicidal and like cutting   Onset: acute on chronic  Timing: lost his latuda two weeks ago  Location: psychiatric   Quality: suicidal, depressed  Severity: moderate  Modifying Factors: none  Associated Symptoms: nasal congestion, chills    Additional History: [name] is a 60 y.o. male with a h/o bipolar disorder, ptsd, cutting presents with suicidal ideation, depression, anxiety. Pt says that he lost his latuda prescription two weeks ago. Last night he was with friends and he was smoking and is not sure if cocaine was involved as well as drinking alcohol. He has been thinking about cutting himself and jumping in front of a car. He says that he has been depressed because he was going out with a woman but then someone told her he had mental health issues and she left him.     PCP: Pcp, None, MD      No current facility-administered medications for this encounter.     Current Outpatient Medications   Medication Sig Dispense Refill    clotrimazole (LOTRIMIN) 1 % cream Apply topically 2 (two) times daily 1 g 1    folic acid (FOLVITE) 1 MG tablet Take 1 tablet (1 mg total) by mouth daily 14 tablet 0    hydrocortisone (CORTAID) 1 % cream Apply topically 2 (two) times daily 28 g 0    hydrOXYzine (ATARAX) 25 MG tablet TAKE ONE(1) Tablet BY MOUTH 3 TIMES DAILY  1    hydrOXYzine (VISTARIL) 25 MG capsule Take 1 capsule (25 mg total) by mouth 2 (two) times daily as needed for Itching or Anxiety 28 capsule 0    ibuprofen (ADVIL,MOTRIN) 400 MG tablet Take 2 tablets (800 mg total) by mouth 2 (two) times daily as needed for Pain 28 tablet 0    multivitamin (MULTIVITAMIN) Tab Take 1 tablet by mouth daily 14 tablet 0    naltrexone (REVIA) 50 MG tablet  Take 1 tablet (50 mg total) by mouth daily 14 tablet 0    nystatin (NYSTOP) powder Apply topically 2 (two) times daily 28 g 0    thiamine (B-1) 100 MG tablet Take 1 tablet (100 mg total) by mouth daily 14 tablet 0       Past History     Past Medical History:  Past Medical History:   Diagnosis Date    Arthritis     Bipolar disorder     PTSD (post-traumatic stress disorder)        Past Surgical History:  History reviewed. No pertinent surgical history.    Family History:  Family History   Problem Relation Age of Onset    Diabetes Neg Hx     Hypertension Neg Hx        Social History:  Social History     Tobacco Use    Smoking status: Never    Smokeless tobacco: Never    Tobacco comments:     Pt denies tobacco use in the last 30 days   Vaping Use    Vaping Use: Never used   Substance Use Topics    Alcohol use: Yes  Alcohol/week: 14.0 standard drinks     Types: 6 Glasses of wine, 6 Cans of beer, 2 Shots of liquor per week    Drug use: Never     Comment: No hx of drug use within tha past 12 months       Allergies:  Allergies   Allergen Reactions    Invega [Paliperidone]     Lamictal [Lamotrigine]     Penicillins        Review of Systems   Constitutional:  Positive for chills. Negative for fever.   HENT:  Positive for congestion.    Eyes:  Negative for discharge and redness.   Respiratory:  Negative for cough and shortness of breath.    Cardiovascular:  Negative for chest pain.   Gastrointestinal:  Negative for abdominal pain.   Musculoskeletal:  Negative for neck pain.   Skin:  Negative for itching.   Neurological:  Negative for speech change.   Endo/Heme/Allergies:  Does not bruise/bleed easily.   Psychiatric/Behavioral:  Positive for depression, hallucinations, substance abuse and suicidal ideas. The patient is nervous/anxious.       Physical Exam   BP 101/62   Pulse 76   Temp 97.9 F (36.6 C)   Resp 18   Ht 6\' 3"  (1.905 m)   Wt 104.3 kg   SpO2 99%   BMI 28.75 kg/m   Physical Exam  Vitals reviewed.    Constitutional:       General: He is not in acute distress.     Appearance: He is well-developed. He is not ill-appearing, toxic-appearing or diaphoretic.   HENT:      Head: Normocephalic and atraumatic.   Eyes:      Conjunctiva/sclera: Conjunctivae normal.   Cardiovascular:      Rate and Rhythm: Normal rate and regular rhythm.   Pulmonary:      Effort: Pulmonary effort is normal.      Breath sounds: Normal breath sounds.   Abdominal:      General: Bowel sounds are normal.      Palpations: Abdomen is soft.   Musculoskeletal:      Cervical back: Normal range of motion and neck supple.   Skin:     General: Skin is warm and dry.   Neurological:      Mental Status: He is alert and oriented to person, place, and time.   Psychiatric:         Attention and Perception: Attention normal.         Mood and Affect: Affect is flat.         Speech: Speech normal.         Behavior: Behavior normal.         Cognition and Memory: Cognition normal.       Diagnostic Study Results     Labs -     Results       Procedure Component Value Units Date/Time    Rapid drug screen, urine [[id number]]  (Abnormal) Collected: 01/29/21 0500    Specimen: Urine Updated: 01/29/21 0522     Urine Amphetamine Screen Negative     Barbiturate Screen, UR Negative     Benzodiazepine Screen, UR Negative     Cannabinoid Screen, UR Negative     Cocaine, UR Positive     Opiate Screen, UR Negative     PCP Screen, UR Negative    Urinalysis Reflex to Microscopic Exam- Reflex to Culture [[id number]]  (Abnormal) Collected: 01/29/21 0500    Specimen:  Urine, Clean Catch Updated: 01/29/21 0519     Urine Type Urine, Clean Ca     Color, UA Yellow     Clarity, UA Clear     Specific Gravity UA >=1.030     Urine pH 6.5     Leukocyte Esterase, UA Small     Nitrite, UA Negative     Protein, UR 30     Glucose, UA Negative     Ketones UA Trace     Urobilinogen, UA 4.0 mg/dL      Bilirubin, UA Small     Blood, UA Trace     RBC, UA 11 - 25 /hpf      WBC, UA 11 - 25 /hpf       Squamous Epithelial Cells, Urine 0 - 5 /hpf      Urine Mucus Present            Radiologic Studies -   Radiology Results (24 Hour)       ** No results found for the last 24 hours. **        .      Medical Decision Making   I am the first provider for this patient.    Vital Signs-Reviewed the patient's vital signs.   Patient Vitals for the past 12 hrs:   BP Temp Pulse Resp   01/29/21 2123 101/62 97.9 F (36.6 C) 76 18   01/29/21 1721 101/62 98.4 F (36.9 C) 80 18   01/29/21 1350 106/75 97.8 F (36.6 C) 67 18       Pulse Oximetry Analysis - Normal 98% on ra      EKG:  Interpreted by the EP.   Time Interpreted:    Rate: 75   Rhythm: Normal Sinus Rhythm    Interpretation: no acute changes        ED Course: I am the primary ED pt for this pt . Pt is signed out pending psych placement.                Diagnosis     Clinical Impression:   1. Bipolar affective disorder, remission status unspecified    2. Acute UTI        _______________________________    Attestations:  This note is prepared by Avanell Shackleton, MD.     Avanell Shackleton, MD.  I confirm that the note above accurately reflects all work, treatment, procedures, and medical decision making performed by me.    _______________________________         Azzie Glatter, MD  01/29/21 2350

## 2021-01-28 NOTE — ED Triage Notes (Signed)
Pt reports he has not had his latuda for 2 weeks. Pt also reports recent drinking binge and having thoughts of cutting himself and having suicidal thoughts. Reports multiple social stressors that are making him feel overwhelmed. Hx of PTSD and bipolar. No open wounds reported by pt, just thoughts.

## 2021-01-29 ENCOUNTER — Telehealth (INDEPENDENT_AMBULATORY_CARE_PROVIDER_SITE_OTHER): Payer: No Typology Code available for payment source

## 2021-01-29 DIAGNOSIS — F319 Bipolar disorder, unspecified: Secondary | ICD-10-CM

## 2021-01-29 LAB — RAPID DRUG SCREEN, URINE
Barbiturate Screen, UR: NEGATIVE
Benzodiazepine Screen, UR: NEGATIVE
Cannabinoid Screen, UR: NEGATIVE
Cocaine, UR: POSITIVE — AB
Opiate Screen, UR: NEGATIVE
PCP Screen, UR: NEGATIVE
Urine Amphetamine Screen: NEGATIVE

## 2021-01-29 LAB — URINALYSIS REFLEX TO MICROSCOPIC EXAM - REFLEX TO CULTURE
Glucose, UA: NEGATIVE
Nitrite, UA: NEGATIVE
Protein, UR: 30 — AB
Specific Gravity UA: >=1.03 (ref 1.001–1.035)
Urine pH: 6.5 (ref 5.0–8.0)
Urobilinogen, UA: 4 mg/dL — AB (ref 0.2–2.0)

## 2021-01-29 MED ORDER — LORAZEPAM 1 MG PO TABS
1.0000 mg | ORAL_TABLET | Freq: Once | ORAL | Status: AC
Start: 2021-01-29 — End: 2021-01-29
  Administered 2021-01-29: 1 mg via ORAL
  Filled 2021-01-29: qty 1

## 2021-01-29 MED ORDER — CIPROFLOXACIN HCL 500 MG PO TABS
500.0000 mg | ORAL_TABLET | Freq: Once | ORAL | Status: AC
Start: 2021-01-29 — End: 2021-01-29
  Administered 2021-01-29: 500 mg via ORAL
  Filled 2021-01-29: qty 1

## 2021-01-29 MED ORDER — IBUPROFEN 600 MG PO TABS
600.0000 mg | ORAL_TABLET | Freq: Once | ORAL | Status: AC
Start: 2021-01-29 — End: 2021-01-29
  Administered 2021-01-29: 600 mg via ORAL
  Filled 2021-01-29: qty 1

## 2021-01-29 NOTE — ED Notes (Signed)
Patient given food. Denies complaints. Continues to endorse SI. Sleeping at this time. Will continue to monitor.
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
## 2021-01-29 NOTE — ED Notes (Signed)
Patient reports some anxiety, given ativan per order. Patient fed. Continues to endorse SI. Request for sitter faxed. Patient denies other complaints at this time. Will continue to monitor.

## 2021-01-30 ENCOUNTER — Inpatient Hospital Stay
Admission: RE | Admit: 2021-01-30 | Discharge: 2021-02-09 | DRG: 750 | Disposition: A | Discharge status: Other Acute Inpatient Hospital | Payer: Medicaid Managed Care Other | Source: Other Acute Inpatient Hospital | Attending: Internal Medicine | Admitting: Internal Medicine

## 2021-01-30 ENCOUNTER — Encounter: Payer: Self-pay | Admitting: Psychiatry

## 2021-01-30 DIAGNOSIS — F1910 Other psychoactive substance abuse, uncomplicated: Secondary | ICD-10-CM | POA: Diagnosis present

## 2021-01-30 DIAGNOSIS — F1010 Alcohol abuse, uncomplicated: Secondary | ICD-10-CM | POA: Diagnosis present

## 2021-01-30 DIAGNOSIS — F4310 Post-traumatic stress disorder, unspecified: Secondary | ICD-10-CM | POA: Diagnosis present

## 2021-01-30 DIAGNOSIS — F39 Unspecified mood [affective] disorder: Secondary | ICD-10-CM | POA: Diagnosis present

## 2021-01-30 DIAGNOSIS — F1414 Cocaine abuse with cocaine-induced mood disorder: Secondary | ICD-10-CM | POA: Diagnosis present

## 2021-01-30 DIAGNOSIS — M1710 Unilateral primary osteoarthritis, unspecified knee: Secondary | ICD-10-CM | POA: Diagnosis present

## 2021-01-30 DIAGNOSIS — F250 Schizoaffective disorder, bipolar type: Principal | ICD-10-CM | POA: Diagnosis present

## 2021-01-30 DIAGNOSIS — R45851 Suicidal ideations: Secondary | ICD-10-CM | POA: Diagnosis present

## 2021-01-30 LAB — POCT RAPID STREP A: Rapid Strep A Screen POCT: NEGATIVE

## 2021-01-30 LAB — COVID-19 (SARS-COV-2): SARS CoV 2 Overall Result: NOT DETECTED

## 2021-01-30 MED ORDER — LOPERAMIDE HCL 2 MG PO CAPS
2.0000 mg | ORAL_CAPSULE | ORAL | Status: DC | PRN
Start: 2021-01-30 — End: 2021-02-09

## 2021-01-30 MED ORDER — FLUTICASONE PROPIONATE 50 MCG/ACT NA SUSP
1.0000 | Freq: Every day | NASAL | Status: DC
Start: 2021-01-30 — End: 2021-02-09
  Administered 2021-01-30 – 2021-02-09 (×8): 1 via NASAL
  Filled 2021-01-30: qty 16

## 2021-01-30 MED ORDER — DOCUSATE SODIUM 100 MG PO CAPS
100.0000 mg | ORAL_CAPSULE | Freq: Two times a day (BID) | ORAL | Status: DC | PRN
Start: 2021-01-30 — End: 2021-02-09

## 2021-01-30 MED ORDER — OLANZAPINE 10 MG PO TABS
10.0000 mg | ORAL_TABLET | Freq: Two times a day (BID) | ORAL | Status: DC | PRN
Start: 2021-01-30 — End: 2021-02-09
  Filled 2021-01-30: qty 1

## 2021-01-30 MED ORDER — DIPHENHYDRAMINE HCL 50 MG/ML IJ SOLN
50.0000 mg | Freq: Three times a day (TID) | INTRAMUSCULAR | Status: DC | PRN
Start: 2021-01-30 — End: 2021-02-09

## 2021-01-30 MED ORDER — DIPHENHYDRAMINE HCL 50 MG/ML IJ SOLN
50.0000 mg | Freq: Once | INTRAMUSCULAR | Status: DC | PRN
Start: 2021-01-30 — End: 2021-01-30

## 2021-01-30 MED ORDER — DIPHENHYDRAMINE HCL 25 MG PO CAPS
25.0000 mg | ORAL_CAPSULE | Freq: Once | ORAL | Status: AC
Start: 2021-01-30 — End: 2021-01-30
  Administered 2021-01-30: 20:00:00 25 mg via ORAL
  Filled 2021-01-30: qty 1

## 2021-01-30 MED ORDER — HYDROXYZINE HCL 10 MG PO TABS
100.0000 mg | ORAL_TABLET | Freq: Every evening | ORAL | Status: DC
Start: 2021-01-30 — End: 2021-01-31
  Administered 2021-01-30: 21:00:00 100 mg via ORAL
  Filled 2021-01-30: qty 10

## 2021-01-30 MED ORDER — IBUPROFEN 400 MG PO TABS
800.0000 mg | ORAL_TABLET | Freq: Three times a day (TID) | ORAL | Status: DC | PRN
Start: 2021-01-30 — End: 2021-02-09
  Administered 2021-01-30 – 2021-02-09 (×13): 800 mg via ORAL
  Filled 2021-01-30 (×14): qty 2

## 2021-01-30 MED ORDER — HYDROXYZINE PAMOATE 25 MG PO CAPS
25.0000 mg | ORAL_CAPSULE | ORAL | Status: DC | PRN
Start: 2021-01-30 — End: 2021-02-09

## 2021-01-30 MED ORDER — DIPHENHYDRAMINE HCL 25 MG PO CAPS
50.0000 mg | ORAL_CAPSULE | Freq: Three times a day (TID) | ORAL | Status: DC | PRN
Start: 2021-01-30 — End: 2021-02-09

## 2021-01-30 MED ORDER — DIPHENHYDRAMINE HCL 25 MG PO CAPS
50.0000 mg | ORAL_CAPSULE | Freq: Once | ORAL | Status: DC | PRN
Start: 2021-01-30 — End: 2021-01-30

## 2021-01-30 MED ORDER — QUETIAPINE FUMARATE 25 MG PO TABS
50.0000 mg | ORAL_TABLET | Freq: Every evening | ORAL | Status: DC | PRN
Start: 2021-01-30 — End: 2021-02-09

## 2021-01-30 MED ORDER — IBUPROFEN 400 MG PO TABS
400.0000 mg | ORAL_TABLET | Freq: Four times a day (QID) | ORAL | Status: DC | PRN
Start: 2021-01-30 — End: 2021-01-30

## 2021-01-30 MED ORDER — BENZOCAINE-MENTHOL MT LOZG (WRAP)
1.0000 | LOZENGE | OROMUCOSAL | Status: DC | PRN
Start: 2021-01-30 — End: 2021-02-09
  Administered 2021-01-30 – 2021-02-09 (×4): 1 via BUCCAL
  Filled 2021-01-30 (×4): qty 1

## 2021-01-30 MED ORDER — ALUM & MAG HYDROXIDE-SIMETH 200-200-20 MG/5ML PO SUSP
30.0000 mL | Freq: Four times a day (QID) | ORAL | Status: DC | PRN
Start: 2021-01-30 — End: 2021-02-09

## 2021-01-30 MED ORDER — ONDANSETRON HCL 8 MG PO TABS
4.0000 mg | ORAL_TABLET | Freq: Three times a day (TID) | ORAL | Status: DC | PRN
Start: 2021-01-30 — End: 2021-02-09

## 2021-01-30 MED ORDER — ACETAMINOPHEN 500 MG PO TABS
1000.0000 mg | ORAL_TABLET | Freq: Once | ORAL | Status: AC
Start: 2021-01-30 — End: 2021-01-30
  Administered 2021-01-30: 11:00:00 1000 mg via ORAL
  Filled 2021-01-30: qty 2

## 2021-01-30 MED ORDER — CIPROFLOXACIN HCL 500 MG PO TABS
500.0000 mg | ORAL_TABLET | Freq: Two times a day (BID) | ORAL | Status: DC
Start: 2021-01-30 — End: 2021-01-30

## 2021-01-30 NOTE — ED Notes (Signed)
1. Bipolar affective disorder, remission status unspecified        ED Disposition     ED Disposition   No Disposition Selected    Condition   --    Date/Time   Wed Jan 30, 2021 12:09 PM    Comment   [name] admitted to [facility] 3A by Dr. Darliss Ridgel. Could you please have pt sign the consent form and fax it to [phone]. Nurse report is [phone]                Ashira Kirsten, Zadie Rhine, MD PhD  01/30/21 [id number]

## 2021-01-30 NOTE — Progress Notes (Signed)
Checklist instructions: Score the patient once a shift and as needed. Absence of a behavior results in a score of 0. Presence or increase in the display of a behavior results in a score of 1. Maximum score (SUM) possible is 6. If a behavior is at baseline for a patient, e.g. the patient is normally confused, this will result in a score of 0. An increase in confusion will result in a score of 1.      [name] is being assessed under the [place] Violence Checklist for any escalations in potentially violent or harmful behavior.     Date of Admission:      01/30/2021                                    Date of Assessment: 01/30/21   Assessment Time 2000      Confused 0      Irritable 0      Boisterous 0      Verbal Threats 0      Physical Threats 0      Attacking Objects 0      SUM 0          TW attempted the following interventions based on the patient's sum total score assessed on the BVC: 1= Monitoring, 2= Verbal De-escalation, and 3= Decrease Stimulation.    Should the patient require multiple interventions or fails to engage in de-escalation with staff, please review the plan for managing the care of an escalated patient.  Please do the following:  1.  Immediately notify Security and Nursing Administrative Supervisor/Director to respond  2.  Conduct team huddle to rule out clinical causes for escalated behavior  3.  Review the note authored by the Associate Professor for additional information  4.  Document specific behaviors observed  5.  Consider SAFE Team call for additional resources  The [place] Violence Checklist (BVC) is a 6 item inventory that was designed to assist in the prediction of imminent violent behavior (24 hours perspective) in healthcare and other sectors where workplace violence is acknowledged as a serious problem.   The BVC does not give instructions on what to do or what interventions to provide when a violent episode occurs; it is a took meant to aid in the risk assessment process.  Interventions are based on the individual, the environment, and cultural sensitivity indicators.

## 2021-01-30 NOTE — ED Notes (Signed)
Pt accepted to [facility] 3A by Dr. Darliss Ridgel. Wt reviewed pt with [facility] 3A Information systems manager. Wt notified RN Lafonda Mosses and MD He of the acceptance and requested voluntary consent be obtained.

## 2021-01-30 NOTE — ED Notes (Signed)
Pt sleeping and calm. Sitter at bedside. Will continue to monitor.

## 2021-01-30 NOTE — ED Notes (Signed)
PT has [place] [employer], bed search is limited to [facility] [phone]):              Contact Person: Candise Bowens              Time Contacted: [phone]              Status: At capacity     [facility] [phone]):              Contact Person: Delton See              Time Contacted: [phone]              Status: At capacity     [facility] ([facility]) [phone]               Contact Person: No Answer              Time Contacted: [phone]              Status: LVM     [facility] [phone]):              Contact Person: Aimee              Time Contacted: [phone]              Status: At capacity

## 2021-01-30 NOTE — ED Notes (Signed)
Pt still c/o sore throat. MD He notified and ordered strep test.

## 2021-01-30 NOTE — Psych Admission Note (Signed)
Nurse SBAR Admission Note:    Situation:  (Reason for Admission, legal status, Patient's goals for inpatient treatment)   [name] is a 60 year old male received on this shift as voluntarily, admission according to the report patient's coworker after he found patient looking for a knife to harm himself while at work.      Patient's expectations / goals for inpatient treatment:  Long term goal: "get stabilized"  Short term goal(s):  "stop the thought and leave a better life."    Background:  (Presentation, appearance, symptoms, mental status, behavior, pertinent historical data including BH treatment, substance use, functional status)       Past Hx of PTSD, Bipolar disorder, schizoaffective disorder, and poly substance abuse.  Current V/S:    Vitals:    01/30/21 1944   BP: 104/68   Pulse: 91   Resp: 18   Temp: 99.1 F (37.3 C)   SpO2: 97%     Current known Allergies:  Invega [paliperidone], Lamictal [lamotrigine], and Penicillins    Assessment: (Immediate Safety Concerns; SI, HI, medical issues)   [name] is a 60 year old male received on this shift as voluntarily, admission according to the report patient's coworker after he found patient looking for a knife to harm himself while at work. Past Hx of PTSD, Bipolar disorder, schizoaffective disorder, and poly substance abuse. Patient is AOX4, calm, cooperative, and denied SI, HI, AVH and anxiety during the admission. The patient affect is labile, insight/judgment are fair. Skin assessment/contraband done previous shift. Pt oriented to unit, unit policies explained to pt and he verbalized understanding. Admission folder given to pt. V/S stable, pt denies use alcohol, drug, and tobacco. Patient request and received ibuprofen 800 mg, Lozenge, Benadryl 25 mg and Flonase nasal spray.  Will continue to monitor and reassess as needed.    Current suicide risk (admission C-SSR-S screen and SAFE-T assessment):      Have you wished you were dead or could go to sleep and not  wake up?  No   Have you had any thoughts of killing yourself?      No    Have you been thinking about how you might do this? No                            Have you had these thoughts and had some intention of acting on them? No   Have you started to work out or worked out the details of how to         kill yourself?No Do you intend to carry out this plan?  No                                                     Have you ever done anything , started to do anything, or prepared to do        anything to end you life? No  Patient placed on (L, M or H) Suicide Alert Level:Low    Rationale for suicide risk level : Recommendations: (Emergent Medical Issues, Lab Considerations, medications, tobacco cessation, observation status, immediate safety precautions.    Presenting suicide risk assessment results and risk level were discussed with: Dr. Darliss Ridgel (admitting physician)    Initial safety precautions : Q 15 min observations, 1:1 constant observation, shaving restriction, belongings search, develop in-hospital safety plan, educated to tell staff if any increase in suicidal ideation.     Tamera Reason, RN

## 2021-01-30 NOTE — ED Notes (Signed)
TW Was unable to obtain the pt's prior auth for their inpatient admission, Unit UR notified via email.

## 2021-01-30 NOTE — ED Notes (Signed)
Pt given breakfast tray. Pt denies distress or any concerns at this time. Will continue to monitor.

## 2021-01-30 NOTE — ED Notes (Signed)
Pt very calm and cooperative with nurse. VSS. Pt denies any needs at this time. PSA at bedside. RN will continue to monitor.

## 2021-01-30 NOTE — ED Notes (Signed)
Voluntary form signed and faxed to [phone]

## 2021-01-30 NOTE — Progress Notes (Addendum)
This is a voluntary patient received on 3A at approx 1650 from patient transport. Patient was wanded by security in [place] per unit protocol. His vital signs were done, 110/70, HR 83, RR 18, temp 98.3 C, 99% O2 on room air. Pt skin check done, skin intact. Pt given jug of ice water. Allergies verified, diet order placed, provider notified of pt's arrival. Patient requested to shower and is showering at this time.     1855 After shower patient's belongings cataloged by staff, pt signed belongings sheet, credit/debit card sent to security. Pt's home medications taken to pharmacy. Pt supervised by staff to shave face and rapid Covid swab done and sent to lab d/t pt complaints of sore throat, chills, and congestion and last covid swab done in the ED on 01/28/21. Pt tolerated well. Ate his dinner and is able to make his needs known.

## 2021-01-31 DIAGNOSIS — F1010 Alcohol abuse, uncomplicated: Secondary | ICD-10-CM

## 2021-01-31 DIAGNOSIS — F39 Unspecified mood [affective] disorder: Secondary | ICD-10-CM

## 2021-01-31 MED ORDER — ACETAMINOPHEN 325 MG PO TABS
650.0000 mg | ORAL_TABLET | Freq: Once | ORAL | Status: AC
Start: 2021-01-31 — End: 2021-01-31
  Administered 2021-01-31: 21:00:00 650 mg via ORAL
  Filled 2021-01-31: qty 2

## 2021-01-31 MED ORDER — ESCITALOPRAM OXALATE 10 MG PO TABS
10.0000 mg | ORAL_TABLET | Freq: Every day | ORAL | Status: DC
Start: 2021-01-31 — End: 2021-02-09
  Administered 2021-01-31 – 2021-02-09 (×10): 10 mg via ORAL
  Filled 2021-01-31 (×10): qty 1

## 2021-01-31 MED ORDER — HYDROXYZINE PAMOATE 25 MG PO CAPS
100.0000 mg | ORAL_CAPSULE | Freq: Every evening | ORAL | Status: DC
Start: 2021-01-31 — End: 2021-02-09
  Administered 2021-01-31 – 2021-02-08 (×9): 100 mg via ORAL
  Filled 2021-01-31 (×9): qty 4

## 2021-01-31 MED ORDER — HYDROCORTISONE 1 % EX CREA
TOPICAL_CREAM | Freq: Two times a day (BID) | CUTANEOUS | Status: DC
Start: 2021-01-31 — End: 2021-02-09
  Filled 2021-01-31: qty 28.4

## 2021-01-31 MED ORDER — ARIPIPRAZOLE 10 MG PO TABS
10.0000 mg | ORAL_TABLET | Freq: Every day | ORAL | Status: DC
Start: 2021-01-31 — End: 2021-02-04
  Administered 2021-01-31 – 2021-02-04 (×5): 10 mg via ORAL
  Filled 2021-01-31 (×5): qty 1

## 2021-01-31 MED ORDER — CIPROFLOXACIN HCL 250 MG PO TABS
500.0000 mg | ORAL_TABLET | Freq: Two times a day (BID) | ORAL | Status: DC
Start: 2021-01-31 — End: 2021-02-01
  Administered 2021-01-31 – 2021-02-01 (×3): 500 mg via ORAL
  Filled 2021-01-31 (×4): qty 2

## 2021-01-31 MED ORDER — ACETAMINOPHEN 325 MG PO TABS
650.0000 mg | ORAL_TABLET | Freq: Four times a day (QID) | ORAL | Status: DC | PRN
Start: 2021-01-31 — End: 2021-01-31

## 2021-01-31 NOTE — Progress Notes (Signed)
[name] was greeted in the hallway at the start of the shift. He denies SI/HI/AVH, and anxiety today. Endorsed pain in knees and shoulders today and was given 800mg  Ibuprofen PRN as ordered with good effect. He maintains eye contact, appropriate speech/behavior/mood. He was seen in the milieu throughout the day, did not attend groups but reported that his goal was "to stay out of my room". Pt has maintained safety today and remains on q 15 minute safety checks at this time.
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
## 2021-01-31 NOTE — Progress Notes (Signed)
Patient did not attend groups, but was visible in the milieu. He declined 1:1/recreational materials, and said he already has two books to read.  Will continue to encourage group attendance.

## 2021-01-31 NOTE — Progress Notes (Signed)
Checklist instructions: Score the patient once a shift and as needed. Absence of a behavior results in a score of 0. Presence or increase in the display of a behavior results in a score of 1. Maximum score (SUM) possible is 6. If a behavior is at baseline for a patient, e.g. the patient is normally confused, this will result in a score of 0. An increase in confusion will result in a score of 1.      [name] is being assessed under the Broset Violence Checklist for any escalations in potentially violent or harmful behavior.     Date of Admission:                                                                         Date of Assessment:    Assessment Time 1030      Confused 0      Irritable 0      Boisterous 0      Verbal Threats 0      Physical Threats 0      Attacking Objects 0      SUM 0          TW attempted the following interventions based on the patient's sum total score assessed on the BVC: 1= Monitoring.      Should the patient require multiple interventions or fails to engage in de-escalation with staff, please review the plan for managing the care of an escalated patient.  Please do the following:  1.  Immediately notify Security and Nursing Administrative Supervisor/Director to respond  2.  Conduct team huddle to rule out clinical causes for escalated behavior  3.  Review the note authored by the Associate Professor for additional information  4.  Document specific behaviors observed  5.  Consider SAFE Team call for additional resources              The Broset Violence Checklist (BVC) is a 6 item inventory that was designed to assist in the prediction of imminent violent behavior (24 hours perspective) in healthcare and other sectors where workplace violence is acknowledged as a serious problem.   The BVC does not give instructions on what to do or what interventions to provide when a violent episode occurs; it is a took meant to aid in the risk assessment process. Interventions are based on  the individual, the environment, and cultural sensitivity indicators.

## 2021-01-31 NOTE — Progress Notes (Signed)
Initial Case Management Assessment and Discharge Planning  [facility]   Patient Name: [name],[name]   Date of Birth Oct 17, 1960   Attending Physician: Elwyn Reach, MD   Primary Care Physician: Pcp, None, MD   Length of Stay 1   Reason for Consult / Chief Complaint Bipolar Disorder        Situation   Admission DX:   1. Mood disorder        A/O Status: X 3    LACE Score: see chart    Patient admitted from: voluntary  Admission Status: inpatient    Health Care Agent: Self  Name: same  Phone number: see chart     Background     Advanced directive:   Received    Code Status:   Full Code     Residence: One story home/apartment    PCP: PCP None, MD  Patient Contact:   [phone] (home)     [phone] (mobile)     Emergency contact:   Extended Emergency Contact Information  Primary Emergency Contact: Jannett Celestine [place]  Home Phone: [phone]  Mobile Phone: [phone]  Relation: Mother      ADL/IADL's: Independent  Previous Level of function: 7 Independent     DME: None  Wheelchair-electric    Pharmacy:   No Pharmacies Listed    Prescription Coverage: Yes    Home Health: The patient is not currently receiving home health services.    Previous SNF/AR: see chart    COVID Vaccine Status: see chart    Date First IMM given: see chart  UAI on file?: No  Transport for discharge? Mode of transportation: Sales executive - Family/Friend to drive patient  Agreeable to Home with family post-discharge:  Yes     Assessment   Assessment completed at bedside/over the phone with patient/spouse/daughter/son.    BARRIERS TO DISCHARGE: none     Recommendation   D/C Plan A: Home with family    D/C Plan B: Follow up care with outpatient psych post inpatient hospitalization.    D/C Plan C: IOP / PHP

## 2021-01-31 NOTE — Consults (Signed)
MEDICINE NEW CONSULT  [facility], DIVISION OF HOSPITALIST MEDICINE   [facility]   [place] Pager: [phone]      Date Time: 01/31/21 1:48 PM  Patient Name: [name],[name]  Requesting Physician: Elwyn Reach, MD  Consulting Physician: Saddie Benders, MD, MD    Primary Care Physician: Marisa Sprinkles, MD    Reason for Consultation: Internal medicine consultation      Assessment:     Active Hospital Problems    Diagnosis    Mood disorder   60 year old with no reported chronic medical comorbidities except for shoulder and knee arthritis for which she is reportedly on as needed ibuprofen, known psychiatric illness of PTSD, bipolar disorder, schizoaffective disorder, schizophrenia, alcohol abuse, cocaine abuse presented with suicidal ideations.    Labs and vitals reviewed:  -Hemodynamically stable.  -Urinalysis with small leukocyte esterase, trace blood.  -Urinary toxicology positive for cocaine.    Upon examination of the patient in the morning, he appears to be in no acute cardiopulmonary distress.  No complaints of cough, shortness of breath, nausea, vomiting, urinary symptoms.  Still feeling depressed and has active suicidal ideations.    Recommendations:   -Management of psychiatric illness per psychiatry.  -Repeat urinalysis after adequate hydration.  -AM CBC to evaluate for leukocytosis  -To continue medications for chronic medical comorbidities as before.    Pt may participate fully with psychiatric treatment, no contraindications identified.     Bronsther, Hyacinth Meeker, MD, thank you for this consultation.  We will follow the patient with you during this hospitalization.  Please contact me with any questions or new issues.      History of Presenting Illness:   [name] is a 60 y.o. male who presents to the hospital with     Past Medical History:     Past Medical History:   Diagnosis Date    Arthritis     Bipolar disorder     PTSD (post-traumatic stress disorder)         Available old records reviewed, including: Epic.  He had multiple recent inpatient psychiatric admissions, recent one prior to this admission he was at [facility]    Past Surgical History:   No past surgical history on file.    Family History:     Family History   Problem Relation Age of Onset    Diabetes Neg Hx     Hypertension Neg Hx        Social History:    reports that he has never smoked. He has never used smokeless tobacco. He reports current alcohol use of about 14.0 standard drinks per week. He reports that he does not use drugs.    Allergies:     Allergies   Allergen Reactions    Invega [Paliperidone]     Lamictal [Lamotrigine]     Penicillins        Medications:     Medications Prior to Admission   Medication Sig    clotrimazole (LOTRIMIN) 1 % cream Apply topically 2 (two) times daily    folic acid (FOLVITE) 1 MG tablet Take 1 tablet (1 mg total) by mouth daily    hydrocortisone (CORTAID) 1 % cream Apply topically 2 (two) times daily    hydrOXYzine (ATARAX) 25 MG tablet TAKE ONE(1) Tablet BY MOUTH 3 TIMES DAILY    hydrOXYzine (VISTARIL) 25 MG capsule Take 1 capsule (25 mg total) by mouth 2 (two) times daily as needed for Itching  or Anxiety    ibuprofen (ADVIL,MOTRIN) 400 MG tablet Take 2 tablets (800 mg total) by mouth 2 (two) times daily as needed for Pain    multivitamin (MULTIVITAMIN) Tab Take 1 tablet by mouth daily    naltrexone (REVIA) 50 MG tablet Take 1 tablet (50 mg total) by mouth daily    nystatin (NYSTOP) powder Apply topically 2 (two) times daily    thiamine (B-1) 100 MG tablet Take 1 tablet (100 mg total) by mouth daily       Current Facility-Administered Medications   Medication Dose Route Frequency    ARIPiprazole  10 mg Oral Daily    ciprofloxacin  500 mg Oral Q12H SCH    escitalopram  10 mg Oral Daily    fluticasone  1 spray Each Nare Daily    hydrocortisone   Topical BID    hydrOXYzine  100 mg Oral QHS            Review of Systems:   All other systems were  reviewed and are negative except as above in HPI     Physical Exam:   Patient Vitals for the past 24 hrs:   BP Temp Temp src Pulse Resp SpO2 Height Weight   01/31/21 0726 111/71 97.9 F (36.6 C) Oral 76 -- 96 % -- --   01/30/21 1944 104/68 99.1 F (37.3 C) Oral 91 18 97 % -- --   01/30/21 1702 110/70 98.3 F (36.8 C) Oral 83 18 99 % 1.905 m (6\' 3" ) 104.3 kg (230 lb)     Body mass index is 28.75 kg/m.  No intake or output data in the 24 hours ending 01/31/21 1348    Gen: NAD?Eyes: Pupils are reactive to light  Throat: Moist mucus membranes  Neck: supple  Chest: S1 S2 +?  Resp: CTA b/l?  Abd: Soft, non tender.   Neuro: Awake, alert. Appropriately conversive, following simple commands.?  Extremities: No pedal edema        Labs:     Recent Labs     01/28/21  2139   WBC 7.88   Hgb 14.0   Hematocrit 43.4   Platelets 171       Recent Labs     01/28/21  2139   Sodium 141   Potassium 3.9   Chloride 105   CO2 27   BUN 17.0   Creatinine 1.0   Glucose 104*   Calcium 9.1   Magnesium 2.2       Recent Labs     01/28/21  2139   AST (SGOT) 26   ALT 22   Alkaline Phosphatase 85   Protein, Total 7.3   Albumin 3.5   Bilirubin, Total 0.9       No results for input(s): PTT, PT, INR in the last 72 hours.          EKG:   Last EKG Result       None              Imaging personally reviewed, including: No results found.      Signed by: Saddie Benders, MD, MD    cc: Elwyn Reach, MD  Pcp, None, MD    This note was generated by the Epic EMR system/ Dragon speech recognition and may contain inherent errors or omissions not intended by the user. Grammatical errors, random word insertions, deletions and pronoun errors  are occasional consequences of this technology due to software limitations. Not all errors are  caught or corrected. If there are questions or concerns about the content of this note or information contained within the body of this dictation they should be addressed directly with the author for clarification.

## 2021-01-31 NOTE — Progress Notes (Signed)
Patient requested and received PRN Ibuprofen 800mg  PO given at 0848 for complaints of pain to bilateral knees and shoulders, attributing it to, "My arthritis.".

## 2021-01-31 NOTE — Progress Notes (Signed)

## 2021-02-01 DIAGNOSIS — N481 Balanitis: Secondary | ICD-10-CM

## 2021-02-01 DIAGNOSIS — R45851 Suicidal ideations: Secondary | ICD-10-CM

## 2021-02-01 LAB — URINALYSIS REFLEX TO MICROSCOPIC EXAM - REFLEX TO CULTURE
Bilirubin, UA: NEGATIVE
Blood, UA: NEGATIVE
Glucose, UA: NEGATIVE
Ketones UA: NEGATIVE
Nitrite, UA: NEGATIVE
Protein, UR: NEGATIVE
Specific Gravity UA: 1.014 (ref 1.001–1.035)
Urine pH: 6.5 (ref 5.0–8.0)
Urobilinogen, UA: NORMAL mg/dL

## 2021-02-01 MED ORDER — HYDROCORTISONE 1 % EX CREA
TOPICAL_CREAM | Freq: Two times a day (BID) | CUTANEOUS | Status: DC
Start: 2021-02-01 — End: 2021-02-01
  Filled 2021-02-01: qty 28.4

## 2021-02-01 MED ORDER — NITROFURANTOIN MONOHYD MACRO 100 MG PO CAPS
100.0000 mg | ORAL_CAPSULE | Freq: Two times a day (BID) | ORAL | Status: DC
Start: 2021-02-01 — End: 2021-02-06
  Administered 2021-02-01 – 2021-02-06 (×11): 100 mg via ORAL
  Filled 2021-02-01 (×14): qty 1

## 2021-02-01 MED ORDER — NYSTATIN 100000 UNIT/GM EX POWD
Freq: Two times a day (BID) | CUTANEOUS | Status: DC
Start: 2021-02-01 — End: 2021-02-09
  Filled 2021-02-01: qty 15

## 2021-02-01 NOTE — Progress Notes (Addendum)
Internal Medicine consultation follow up.   Vitals reviewed.  No fever.  No CBC for review.    Upon examination the patient unit, he appears to be in no acute cardiopulmonary distress.  Ambulating on the floor.  Today he complained of discomfort with urination, typical of balanitis.  He is still able to urinate.    Gen: NAD?  Eyes: Pupils are reactive to light  Throat: Moist mucus membranes  Neck: supple  Chest: S1 S2 +?  Resp: CTA b/l?  Abd: Soft, non tender.   Neuro: Awake, alert. Appropriately conversive, following simple commands.?  Extremities: No pedal edema  Foreskin with mild erythema and edema.    Plan:  -D/c Cipro (? Increased risk of Suicidal ideation)  -Nitrofurantoin 100 mg q12  x 7 days.   -Nystatin topical cream has been ordered.  -Short course of topical hydrocortisone.  -Local hygiene measures reinforced.    Please consult urology if no improvement/more difficulty with urination.

## 2021-02-01 NOTE — Progress Notes (Signed)
Checklist instructions: Score the patient once a shift and as needed. Absence of a behavior results in a score of 0. Presence or increase in the display of a behavior results in a score of 1. Maximum score (SUM) possible is 6. If a behavior is at baseline for a patient, e.g. the patient is normally confused, this will result in a score of 0. An increase in confusion will result in a score of 1.      [name] is being assessed under the Broset Violence Checklist for any escalations in potentially violent or harmful behavior.     Date of Admission:                     01/30/2021                                                    Date of Assessment:    Assessment Time 0900      Confused 0      Irritable 0      Boisterous 0      Verbal Threats 0      Physical Threats 0      Attacking Objects 0      SUM 0          TW attempted the following interventions based on the patient's sum total score assessed on the BVC: 0, monitering  Should the patient require multiple interventions or fails to engage in de-escalation with staff, please review the plan for managing the care of an escalated patient.  Please do the following:  1.  Immediately notify Security and Nursing Administrative Supervisor/Director to respond  2.  Conduct team huddle to rule out clinical causes for escalated behavior  3.  Review the note authored by the Associate Professor for additional information  4.  Document specific behaviors observed  5.  Consider SAFE Team call for additional resources              The Broset Violence Checklist (BVC) is a 6 item inventory that was designed to assist in the prediction of imminent violent behavior (24 hours perspective) in healthcare and other sectors where workplace violence is acknowledged as a serious problem.   The BVC does not give instructions on what to do or what interventions to provide when a violent episode occurs; it is a took meant to aid in the risk assessment process. Interventions are based on  the individual, the environment, and cultural sensitivity indicators.
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
## 2021-02-01 NOTE — Treatment Plan (Signed)
Interdisciplinary Treatment Plan Update Meeting    02/01/2021  Jeannette [place]    Participants:  Patient:  [name]  Attending Physician:  Elwyn Reach, MD  RN: Anell Barr.  Mental Health Therapist: Veryl Speak.  Social Worker: Ocie BobInetta Fermo (Pharmacist)    Short Term Goal: To get my mental status   Long Term Goal: To get into  a rehab for substance abuse.    Objective:  Review response to treatment, reassess needs/goals, update plan as indicated incorporating patient's strengths and stated needs, goals, and preferences.   Patient is a 60 year old man admitted for suicidal ideation.Patient has history of substance abuse.    1. Summary of Patient Progress on Treatment Plan Goals:   Patient reports good sleep from last night. He states"the medication given me helped a lot. Patient reports his goal is to stay in his room but he has been coming out of his room to watch tv in the milieu. Patient endorsed hearing voices which tell him to kill himself but he has no plan. Patient reports the ABT therapy is working and he also requested for nystatin powder for his balanitis.MD recommended a substance abuse rehab program for patient and patient has  agreed.          2. Level of Patient Involvement:  Actively engaged/contributing    3. Patient Understanding of Plan of Care:  Able to verbalize goals and interventions    4. Level of Agreement/Commitment to Plan of Care:  Agrees with and is committed to plan of care          Contributor Signatures:      MD_________________________________ Date___________________    SW_________________________________Date ___________________    RN _________________________________Date____________________    Patient_______________________________Date____________________    Other________________________________Date ___________________    Other________________________________Date ___________________    (This document is signed electronically by Clinical research associate and electronic co-signer.   Other participants sign a printed copy which is scanned into the EMR)

## 2021-02-01 NOTE — Plan of Care (Addendum)
Problem: At Risk for Suicide AS EVIDENCED BY...  Goal: Eugune reported his goal is to " find meds that work for him maybe try ECT"  Outcome: Progressing  Goal: Yeray will remain safe during hospitalization  Outcome: Progressing  Goal: Suicide Alert Level High  Outcome: Progressing     Problem: Safety  Goal: Abenezer will be free from infection during hospitalization  Outcome: Progressing     Problem: Mood Disorder  Goal: Bond will report  improved mood  Outcome: Progressing  Patient appeared mildly depressed with a labile affect. Reported good sleep and states the medication given him last night worked well. Patient endorsed hearing voices which the voices are telling him to kill himself but he has no plan.Patient  reported joint and knee pain, ibuprofen 800 mg po  given with much improvement reported.Patient is currently on ABT for UTI and banalities. The patient tolerated his scheduled medications. Patient's stated goal for today "to stay out of  my room". Patient attended unit activities ,treatment plan meeting and participated fairly. Patient has an order for ECG. Currently ECG machine is out of paper. All effort made but proved futile. MD aware.Oncoming  nurse notified.

## 2021-02-01 NOTE — Progress Notes (Signed)
Checklist instructions: Score the patient once a shift and as needed. Absence of a behavior results in a score of 0. Presence or increase in the display of a behavior results in a score of 1. Maximum score (SUM) possible is 6. If a behavior is at baseline for a patient, e.g. the patient is normally confused, this will result in a score of 0. An increase in confusion will result in a score of 1.      Fredie Majano is being assessed under the Broset Violence Checklist for any escalations in potentially violent or harmful behavior.     Date of Admission: 01/30/21                                                           Date of Assessment: 02/01/21   Assessment Time 2126      Confused 0      Irritable 0      Boisterous 0      Verbal Threats 0      Physical Threats 0      Attacking Objects 0      SUM 0          TW attempted the following interventions based on the patient's sum total score assessed on the BVC: 1= Monitoring.      Should the patient require multiple interventions or fails to engage in de-escalation with staff, please review the plan for managing the care of an escalated patient.  Please do the following:  1.  Immediately notify Security and Nursing Administrative Supervisor/Director to respond  2.  Conduct team huddle to rule out clinical causes for escalated behavior  3.  Review the note authored by the Associate Professor for additional information  4.  Document specific behaviors observed  5.  Consider SAFE Team call for additional resources              The Broset Violence Checklist (BVC) is a 6 item inventory that was designed to assist in the prediction of imminent violent behavior (24 hours perspective) in healthcare and other sectors where workplace violence is acknowledged as a serious problem.   The BVC does not give instructions on what to do or what interventions to provide when a violent episode occurs; it is a took meant to aid in the risk assessment process. Interventions are based on the  individual, the environment, and cultural sensitivity indicators.

## 2021-02-01 NOTE — Progress Notes (Signed)
Patient attended most groups today.   In community meeting, he reported feeling "tired" and set a goal to "stay out of my room."  In group therapy he worked on a discharge planning worksheet, and stressed the importance for him of not isolating once he goes home.  One activity he identified was going to church.  He has been visible in the milieu, watching tv with peers.  Will continue to encourage group attendance.

## 2021-02-01 NOTE — Progress Notes (Addendum)
Psychiatry Progress Note  (Level 1 = Problem Focused, Level 2 = Expanded Problem Focused, Level 3 = Detailed)    Patient Name: [name]            Current Date/Time:  9/9/202210:57 AM  MRN:  [id number]                            Attending Physician: Elwyn Reach, MD  DOB: Sep 02, 1960                                Gender: male                              I. History   Informants: pt  A. Chief Complaint or Reason for Admission       SI  B.History of Present Illness: Interval History     (Symptoms and qualifiers:1 for Level 1, 2 for Level 2 and 3+ for Level 3)  60 yo AAM w/ bipolar do and PTSD and polysubstance abuse admitted voluntarily for SI.  He has an extensive psych history and was previously followed by an ACT team.  He has had multiple suicide attempts.  He has been off meds for 2 weeks, lost them, but says that the latuda he was taking basically stopped working anyway      Pt says medications always take awhile to work for him, so he is not surprised that he is still having intermittent thoughts of suicide  He still feels a little generalized weakness and run down but no fevers  Attributes this to a UTI  He has set the goal for himself to be out of his room as much as possible because he knows he sometimes isolates.  Sleep was better last night w/ vistaril  Appetite is fine  No headache    Medications:   Current Medications       Scheduled       Medication Ordered Dose/Rate, Route, Frequency Last Action    ARIPiprazole (ABILIFY) tablet 10 mg 10 mg, PO, Daily Given, 10 mg at 09/09 0844    ciprofloxacin (CIPRO) tablet 500 mg 500 mg, PO, Q12H SCH Given, 500 mg at 09/09 0844    escitalopram (LEXAPRO) tablet 10 mg 10 mg, PO, Daily Given, 10 mg at 09/09 0844    fluticasone (FLONASE) 50 MCG/ACT nasal spray 1 spray 1 spray, EACH NARE, Daily Given, 1 spray at 09/09 0845    hydrocortisone (CORTAID) 1 % cream No Dose/Rate, TP, BID Given, No Dose/Rate at 09/09 0844    hydrOXYzine (VISTARIL) capsule 100 mg  100 mg, PO, QHS Given, 100 mg at 09/08 2110    nystatin (NYSTOP) powder No Dose/Rate, TP, BID Ordered              PRN       Medication Ordered Dose/Rate, Route, Frequency Last Action    alum & mag hydroxide-simethicone (MAALOX PLUS) 200-200-20 mg/5 mL suspension 30 mL 30 mL, PO, Q6H PRN Ordered    benzocaine-menthol (CEPACOL/CHLORASEPTIC) lozenge 1 lozenge 1 lozenge, BU, Q1H PRN Given, 1 lozenge at 09/07 1959    diphenhydrAMINE (BENADRYL) capsule 50 mg (Or Linked Group #1) 50 mg, PO, TID PRN Ordered    diphenhydrAMINE (BENADRYL) injection 50 mg (Or Linked Group #1) 50 mg, IM, TID PRN Ordered  docusate sodium (COLACE) capsule 100 mg 100 mg, PO, BID PRN Ordered    hydrOXYzine (VISTARIL) capsule 25 mg 25 mg, PO, Q4H PRN Ordered    ibuprofen (ADVIL) tablet 800 mg 800 mg, PO, TID PRN Given, 800 mg at 09/09 0844    loperamide (IMODIUM) capsule 2 mg 2 mg, PO, Q3H PRN Ordered    OLANZapine (ZyPREXA) tablet 10 mg 10 mg, PO, BID PRN Ordered    ondansetron (ZOFRAN) tablet 4 mg 4 mg, PO, Q8H PRN Ordered    QUEtiapine (SEROquel) tablet 50 mg 50 mg, PO, QHS PRN Ordered                    II. Examination   Vital signs reviewed:   Blood pressure 113/70, pulse 83, temperature 97.7 F (36.5 C), temperature source Oral, resp. rate 18, height 1.905 m (6\' 3" ), weight 104.3 kg (230 lb), SpO2 99 %.     Mental Status Exam  (Level 1 is 1-5, Level 2 is 6-8, Level 3 is 9+)  General appearance: Appears chronological age, Good hygiene and grooming is good, no body odor  Attitude/Behavior: Calm, Cooperative and Eye Contact is  Good  Motor: No abnormalities noted  Gait: No obvious abnormalities  Muscle strength and tone: Grossly intact  Speech:   Spontaneous: Yes  Rate, volume and tone are normal  No increased latency  Mood: depressed  Affect:  Mood congruent but w/ some range of affect  Thought Process:   Coherent: Yes  Logical:  Yes  Associations: Goal-directed  Thought Content:  Intermittent suicidal thoughts  Perceptions:  No AH/VH  Does  not seem to be responding to internal stimuli  Insight: excellent  Judgment: Fair  Cognition:   Level of Consciousness: Intact  Orientation: Intact to self, place and time  Recent Memory: Intact  Remote Memory: Intact  Attention and Concentration: Intact      Psychiatric / Cognitive Instruments: None    Pertinent Physical Exam: Not relevant to current chief complaint/reason for admission    Imaging / EKG / Labs: Labs in the last 24 hours  Results       Procedure Component Value Units Date/Time    Urinalysis Reflex to Microscopic Exam- Reflex to Culture [[id number]]  (Abnormal) Collected: 01/31/21 1815    Specimen: Urine, Clean Catch Updated: 02/01/21 0037     Urine Type Clean Catch     Color, UA Straw     Clarity, UA Clear     Specific Gravity UA 1.014     Urine pH 6.5     Leukocyte Esterase, UA Moderate     Nitrite, UA Negative     Protein, UR Negative     Glucose, UA Negative     Ketones UA Negative     Urobilinogen, UA Normal mg/dL      Bilirubin, UA Negative     Blood, UA Negative     RBC, UA 3 - 5 /hpf      WBC, UA 6 - 10 /hpf      Squamous Epithelial Cells, Urine 6-10 /hpf      Hyaline Casts, UA 0-2 /lpf      Urine Mucus Present    Narrative:      H/o recurrent utis w/ flu like symptoms            III. Assessment and Plan (Medical Decision Making)   1. I certify that this patient requires inpatient hospitalization due to acute risk to self  2. Psychiatric Diagnoses  Bipolar disorder, mixed episode  PTSD  Medical: balanitis           3.Labs reviewed and compared to prior labs in the system. Past medical records reviewed. Coordination of care was discussed with inpatient team and as available with the outpatient team.     4. Assessment / Impression  60 yo AAM w/ history of PTSD and bipolar do admitted w/ Si in the context of recent relapse on etoh and not taking his meds.  His insight is extremely good and he is motivated to get back on meds and to seek out formal tx for his etoh use.  He has a history of  multiple suicide attempts in the past.     Suicide Risk Assessment  Suicide Thoughts / Behaviors: Yes  Current Plan: Yes  Access to firearm: No  Past suicide attempts: Yes  Diagnosis of MDD, BPAD, Schizophrenia, Cluster B PD, Anorexia, Substance Use: Yes, bpad.  Emotional Trauma: Yes  Family history of suicide: No  Demographic Risk Factors: Yes, gender, singkle, etoh use.     Plan / Recommendations:  Patient admitted to inpatient psychiatric unit on voluntary status for further diagnostic and safety evaluations, clinical stabilization with psychotropic treatment and non-pharmacological interventions, and for discharge planning.     Biological Plan:  9/8: started lexapro 10mg  and abilify 10mg  for ptsd and bipolar   Vistaril for sleep and PRN for anxiety    Will keep meds as is at least through the weekend to assess response    Discussed case w/ medicine, they will manage  balanitis/possible UTI    Psychosocial Plan:  Individual therapy: Psycho-education and psychotherapy of the following modalities will be provided during daily visits:Supportive and Cognitive Behavioral Therapy  Group and milieu therapies: daily per unit's schedule.  Social Work intervention will be provided for discharge planning and will include assistance with follow-up psychiatric care.       Plan for Family Involvement:  mother is aware of the situation  Other Providers Contact Information and Dates Contacted: No Updates    Total Attending time spent 35 minutes (floor time) with more than 50 percent of time in direct patient contact, coordinating care and counseling.    Signed by: Elwyn Reach, MD  02/01/2021  10:57 AM    Pt has capacity to make decisions  Next of kin:  mother, Georgie Haque, [phone]

## 2021-02-01 NOTE — Progress Notes (Signed)
Pt  received in the TV room watching TV and interacting with selective peers. Pt Aox4, presenting with depressed mood, blunted affect, fair insight/judgment with guarded but cooperative attitude, pt currently denies any SI/HI/AVH and anxiety but pt endorsed Knee pain stating " it is arthritis pain and it is now 6/10", pt requested/received Tylenol 650 mg PO with positive effect. Pt was complaint with his scheduled medications.pt ate snacks and hydrated well . Pt stated his goal "to stay safe and not cut my self"; pt goal met. No injury noted safety maintained.

## 2021-02-02 DIAGNOSIS — F3160 Bipolar disorder, current episode mixed, unspecified: Secondary | ICD-10-CM

## 2021-02-02 NOTE — Plan of Care (Signed)
Problem: At Risk for Suicide AS EVIDENCED BY...  Goal: [name] reported his goal is to " find meds that work for him maybe try ECT"  Outcome: Progressing  Goal: [name] will remain safe during hospitalization  Outcome: Progressing  Goal: Suicide Alert Level High  Outcome: Progressing  Goal: [name] will Identify and participate in at least one supportive program therapies daily  Outcome: Not Progressing  Goal: [name] will complete discharge safety and recovery plan within his stay on the unit  Outcome: Not Progressing     Problem: Safety  Goal: [name] will be free from infection during hospitalization  Outcome: Progressing     Problem: Discharge Barriers  Goal: [name] will be discharged home or other facility with appropriate resources  Outcome: Not Progressing     Problem: Mood Disorder  Goal: [name] will report  improved mood  Outcome: Progressing  Goal: [name] will verbalize reduction in hallucinations during his hosptalization  Outcome: Progressing     Problem: Addiction to alcohol AS EVIDENCED BY:  Goal: [name] will  develop a discharge plan during his stay on the unit   Outcome: Not Progressing     Patient received in milieu, upbeat and reading Quran and reports feeling upbeat and better. He denies SI,HI,AVH but does endorse feeling up and down at times like at roller coaster.Patient is eating and hydrating well and interacting with peers and watching TV. His goal is to spend time out of his room and not isolate which he has done. Patient requested and received ibuprofen 800- mg po for bilateral knee pain(10) with good response he expected(5) X2.  Staff will continue to monitor for needs and safety and any change in mood or behavior.

## 2021-02-02 NOTE — Plan of Care (Signed)
Problem: At Risk for Suicide AS EVIDENCED BY...  Goal: Suicide Alert Level High  Outcome: Progressing   Pt stated " I don't have persistent thought of SI  as I use to"  Problem: Mood Disorder  Goal: [name] will report  improved mood  Outcome: Progressing  Pt stated " I feel much better today especially after taking the Macrobid I feel great and I fee like I am already feeling better"  Goal: [name] will verbalize reduction in hallucinations during his hosptalization  Outcome: Progressing   Pt denies any AVH    Pt received in the milieu watching TV and pleasantly interacting with selective peers. Pt is Aox4 presenting with pleasant mood/affect stating "I feel much better today especially after taking the Macrobid I feel great and I fee like I am already feeling better", playful behavior, fair insight/judgment with cooperative attitude. Pt currently denies any SI/HI/AVH, Anxiety and pain / stated " I don't have persistent thought of SI  as I use to". Pt complaint with his scheduled medications with good effect. Pt ate snacks and is hydrating well. Pt stated his goal for the night "eat pizza for snack and have a good night sleep"; pt goal met. Pt stated "the Macrobid is a miracle even my mental status is better after taking the first pill. I thought I had COVID. I feel great today". Pt currently sleeping. No injury noted. Safety maintained.

## 2021-02-02 NOTE — Progress Notes (Signed)
Checklist instructions: Score the patient once a shift and as needed. Absence of a behavior results in a score of 0. Presence or increase in the display of a behavior results in a score of 1. Maximum score (SUM) possible is 6. If a behavior is at baseline for a patient, e.g. the patient is normally confused, this will result in a score of 0. An increase in confusion will result in a score of 1.      [name] is being assessed under the Broset Violence Checklist for any escalations in potentially violent or harmful behavior.     Date of Admission:01/30/21                                                                    Date of Assessment: 02/02/21   Assessment Time 2158      Confused 0      Irritable 0      Boisterous 0      Verbal Threats 0      Physical Threats 0      Attacking Objects 0      SUM 0          TW attempted the following interventions based on the patient's sum total score assessed on the BVC: 1= Monitoring.      Should the patient require multiple interventions or fails to engage in de-escalation with staff, please review the plan for managing the care of an escalated patient.  Please do the following:  1.  Immediately notify Security and Nursing Administrative Supervisor/Director to respond  2.  Conduct team huddle to rule out clinical causes for escalated behavior  3.  Review the note authored by the Associate Professor for additional information  4.  Document specific behaviors observed  5.  Consider SAFE Team call for additional resources              The Broset Violence Checklist (BVC) is a 6 item inventory that was designed to assist in the prediction of imminent violent behavior (24 hours perspective) in healthcare and other sectors where workplace violence is acknowledged as a serious problem.   The BVC does not give instructions on what to do or what interventions to provide when a violent episode occurs; it is a took meant to aid in the risk assessment process. Interventions are  based on the individual, the environment, and cultural sensitivity indicators.

## 2021-02-02 NOTE — Progress Notes (Signed)
Checklist instructions: Score the patient once a shift and as needed. Absence of a behavior results in a score of 0. Presence or increase in the display of a behavior results in a score of 1. Maximum score (SUM) possible is 6. If a behavior is at baseline for a patient, e.g. the patient is normally confused, this will result in a score of 0. An increase in confusion will result in a score of 1.      Maijor Hornig is being assessed under the Broset Violence Checklist for any escalations in potentially violent or harmful behavior.     Date of Admission:                   01/30/2021                                                      Date of Assessment:    Assessment Time 1000      Confused 0      Irritable 0      Boisterous 0      Verbal Threats 0      Physical Threats 0      Attacking Objects 0      SUM 0          TW attempted the following interventions based on the patient's sum total score assessed on the BVC: 0.      Should the patient require multiple interventions or fails to engage in de-escalation with staff, please review the plan for managing the care of an escalated patient.  Please do the following:  1.  Immediately notify Security and Nursing Administrative Supervisor/Director to respond  2.  Conduct team huddle to rule out clinical causes for escalated behavior  3.  Review the note authored by the Associate Professor for additional information  4.  Document specific behaviors observed  5.  Consider SAFE Team call for additional resources              The Broset Violence Checklist (BVC) is a 6 item inventory that was designed to assist in the prediction of imminent violent behavior (24 hours perspective) in healthcare and other sectors where workplace violence is acknowledged as a serious problem.   The BVC does not give instructions on what to do or what interventions to provide when a violent episode occurs; it is a took meant to aid in the risk assessment process. Interventions are based on the  individual, the environment, and cultural sensitivity indicators.

## 2021-02-02 NOTE — Progress Notes (Signed)
PSYCHIATRY INPATIENT PROGRESS NOTE    Date/Time: 02/02/21 4:27 PM  Patient's Name: [name], [name]  MR#:: [id number]  DOB: 16-Apr-1961    Patient is a 60 y.o male with PPHx of PTSD, Bipolar disorder, Schizoaffective disorder and poly substance abuse, presenting to the ED for SI . PT BIB a co worker after he found pt looking for a knife to harm himself while at work)    Today, pt. Reported, he is still feeling the same, he continuously experiencing thoughts/urges to cut himself but he is able to control. He reported, he ran out of medications, he called his brother who is out of town, he then started experiencing suicidal thoughts with multiple plans.     He said, he met with his doctor, discussed the medications, started a new medication which has not helped yet but he said, he would like to wait and see. He said, he slept for 3 hours last night which is normal for him. Only medication that helps for sleep is Vistaryl 100 MG.     Medications:   Current medications and doses:    Current Facility-Administered Medications   Medication Dose Route Frequency Provider Last Rate Last Admin    alum & mag hydroxide-simethicone (MAALOX PLUS) 200-200-20 mg/5 mL suspension 30 mL  30 mL Oral Q6H PRN Berneta Levins, MD        ARIPiprazole (ABILIFY) tablet 10 mg  10 mg Oral Daily Dixon Boos B, MD   10 mg at 02/02/21 0903    benzocaine-menthol (CEPACOL/CHLORASEPTIC) lozenge 1 lozenge  1 lozenge Buccal Q1H PRN Berneta Levins, MD   1 lozenge at 01/30/21 1959    diphenhydrAMINE (BENADRYL) capsule 50 mg  50 mg Oral TID PRN Berneta Levins, MD        Or    diphenhydrAMINE (BENADRYL) injection 50 mg  50 mg Intramuscular TID PRN Berneta Levins, MD        docusate sodium (COLACE) capsule 100 mg  100 mg Oral BID PRN Berneta Levins, MD        escitalopram (LEXAPRO) tablet 10 mg  10 mg Oral Daily Dixon Boos B, MD   10 mg at 02/02/21 0903    fluticasone (FLONASE) 50 MCG/ACT nasal spray 1 spray  1 spray Each Nare Daily Berneta Levins, MD   1 spray at 02/02/21 0902    hydrocortisone (CORTAID) 1 % cream   Topical BID Elwyn Reach, MD   Given at 02/02/21 0902    hydrOXYzine (VISTARIL) capsule 100 mg  100 mg Oral QHS Dixon Boos B, MD   100 mg at 02/01/21 2126    hydrOXYzine (VISTARIL) capsule 25 mg  25 mg Oral Q4H PRN Berneta Levins, MD        ibuprofen (ADVIL) tablet 800 mg  800 mg Oral TID PRN Berneta Levins, MD   800 mg at 02/02/21 1557    loperamide (IMODIUM) capsule 2 mg  2 mg Oral Q3H PRN Berneta Levins, MD        nitrofurantoin (macrocrystal-monohydrate) (MACROBID) capsule 100 mg  100 mg Oral Q12H SCH Singasani, Tessie Fass, MD   100 mg at 02/02/21 0903    nystatin (NYSTOP) powder   Topical BID Saddie Benders, MD   Given at 02/02/21 0902    OLANZapine (ZyPREXA) tablet 10 mg  10 mg Oral BID PRN Berneta Levins, MD        ondansetron [facility]) tablet 4 mg  4 mg Oral Q8H PRN Berneta Levins, MD  QUEtiapine (SEROquel) tablet 50 mg  50 mg Oral QHS PRN Berneta Levins, MD           Legal Status:    voluntary    Subjective Findings:     Symptoms:  depression, suicidal thoughts, Cocaine abuse  Medication Side Effects:  None  Collateral History:   Provided  Family Involvement: Completed  Psychosocial Functioning: Improving  Attending Groups: yes    Objective Findings:    Alert, Well oriented AAM    Mental Status Evaluation:     Appearance:  age appropriate and overweight   Behavior:  normal   Speech:  normal pitch and normal volume   Mood:  normal   Affect:  mood-congruent   Thought Process:  normal   Thought Content:  normal   Sensorium:  person, place, time/date, situation, day of week, month of year, and year   Cognition:  grossly intact   Insight:  fair   Judgment:  fair       Neuro Vegetative Functions:     Energy: Energy level is good.  The patient is able to perform most usual functions.  Sleep: Sleep pattern was reported as intact and regular with no initial, middle or late insomnia  Appetite: Appetite is reported to  be intact, with no significant changes in weight.    Vital Signs:     Vitals:    02/02/21 0726   BP: 115/69   Pulse: 97   Resp: 17   Temp: 98.1 F (36.7 C)   SpO2: 96%        Laboratory Assessment:     Results       ** No results found for the last 24 hours. **             Assessment:   Clinical formulation:  60 years old AAM with Cocaine abuse was admitted for suicidal thoughts.     He has capacity to make medical decisions. He identified his mother Pavel Gadd [phone] as his next of kin    Diagnoses:     Axis I: Bipolar Disorder, depression, Cocaine abuse, Substance induced Mood Disorder  Axis UJ:WJXBJ  Axis III: See problem list in the medical record  Axis IV: Other psychosocial or environmental problems  Axis V:41-50: serious symptoms.    Plan:     Medication Plan: Continue  -Abilify 10 Mg Daily  -Lexapro 10 Mg Daily  -Vistaryl 100 Mg at bed time    Medication Education: Provided    Medical work-up plan/testing:  Completed    Plan for Family Involvement:  Yes    Aftercare Plan: Discharge to home    Ongoing hospitalization required for Danger to self    Other Providers Contact Information and Dates Contacted: Per primary team    On this admission patient educated about and provided input into their treatment plan.  Patient understands potential risks and benefits of proposed treatment plan. -    Expected length of stay: 3-5 days  Total floor time: 25 Minutes  At least 50% in coordination of care and counseling:Yes        Signed by: Berneta Levins, MD, MD   02/02/21 4:27 PM

## 2021-02-03 NOTE — Plan of Care (Signed)
Problem: At Risk for Suicide AS EVIDENCED BY...  Goal: Suicide Alert Level High  Outcome: Progressing   Pt currently denies any SI   Problem: Safety  Goal: [place] will be free from infection during hospitalization  Outcome: Progressing   No injury noted   Problem: Mood Disorder  Goal: [place] will report  improved mood  Outcome: Progressing   Pt stated " I am feeling good"    Pt received in the milieu watching TV and interacting pleasantly with selective peers. Pt is Aox4 presenting with pleasant mood/affect, playful behavior, fair insight/judgment with cooperative attitude. Pt currently denies any SI/HI/AVH, Anxiety and pain stating " my SI thought has been going up and down but I feel safe here and won't do it". Pt was complaint with his scheduled medications. Pt ate snacks and is hydrating well. Pt stated his goal for the night "to stay asleep" ; pt goal met. No injury noted. Safety maintained.

## 2021-02-03 NOTE — Progress Notes (Signed)
Checklist instructions: Score the patient once a shift and as needed. Absence of a behavior results in a score of 0. Presence or increase in the display of a behavior results in a score of 1. Maximum score (SUM) possible is 6. If a behavior is at baseline for a patient, e.g. the patient is normally confused, this will result in a score of 0. An increase in confusion will result in a score of 1.      [name] is being assessed under the Broset Violence Checklist for any escalations in potentially violent or harmful behavior.     Date of Admission:               01/30/2021          Date of Assessment: 02/03/2021   Assessment Time 2244      Confused 0      Irritable 0      Boisterous 0      Verbal Threats 0      Physical Threats 0      Attacking Objects 0      SUM 0          TW attempted the following interventions based on the patient's sum total score assessed on the BVC: 1= Monitoring.      Should the patient require multiple interventions or fails to engage in de-escalation with staff, please review the plan for managing the care of an escalated patient.  Please do the following:  1.  Immediately notify Security and Nursing Administrative Supervisor/Director to respond  2.  Conduct team huddle to rule out clinical causes for escalated behavior  3.  Review the note authored by the Associate Professor for additional information  4.  Document specific behaviors observed  5.  Consider SAFE Team call for additional resources              The Broset Violence Checklist (BVC) is a 6 item inventory that was designed to assist in the prediction of imminent violent behavior (24 hours perspective) in healthcare and other sectors where workplace violence is acknowledged as a serious problem.   The BVC does not give instructions on what to do or what interventions to provide when a violent episode occurs; it is a took meant to aid in the risk assessment process. Interventions are based on the individual, the  environment, and cultural sensitivity indicators.

## 2021-02-03 NOTE — Progress Notes (Signed)
Pt requested ibuprofen for arthritis pain. Patient was give 800 mg ibuprofen PRN for said pain.

## 2021-02-03 NOTE — Progress Notes (Signed)
PSYCHIATRY INPATIENT PROGRESS NOTE    Date/Time: 02/03/21 4:14 PM  Patient's Name: [name], [name]  MR#:: [id number]  DOB: 06-09-1960    Patient is a 60 y.o male with PPHx of PTSD, Bipolar disorder, Schizoaffective disorder and poly substance abuse, presenting to the ED for SI . PT BIB a co worker after he found pt looking for a knife to harm himself while at work)    Today, pt. Said, he is feeling the same, he is not expecting to get better in a day or two, he knows it takes time and he is willing to wait. He said, he slept very well last night which is a sign that he is getting better.     He continues to be isolative eventhough spends time in the milieu watching TV. No major concerns    Medications:   Current medications and doses:    Current Facility-Administered Medications   Medication Dose Route Frequency Provider Last Rate Last Admin    alum & mag hydroxide-simethicone (MAALOX PLUS) 200-200-20 mg/5 mL suspension 30 mL  30 mL Oral Q6H PRN Berneta Levins, MD        ARIPiprazole (ABILIFY) tablet 10 mg  10 mg Oral Daily Dixon Boos B, MD   10 mg at 02/03/21 0849    benzocaine-menthol (CEPACOL/CHLORASEPTIC) lozenge 1 lozenge  1 lozenge Buccal Q1H PRN Berneta Levins, MD   1 lozenge at 01/30/21 1959    diphenhydrAMINE (BENADRYL) capsule 50 mg  50 mg Oral TID PRN Berneta Levins, MD        Or    diphenhydrAMINE (BENADRYL) injection 50 mg  50 mg Intramuscular TID PRN Berneta Levins, MD        docusate sodium (COLACE) capsule 100 mg  100 mg Oral BID PRN Berneta Levins, MD        escitalopram (LEXAPRO) tablet 10 mg  10 mg Oral Daily Dixon Boos B, MD   10 mg at 02/03/21 0849    fluticasone (FLONASE) 50 MCG/ACT nasal spray 1 spray  1 spray Each Nare Daily Berneta Levins, MD   1 spray at 02/03/21 0853    hydrocortisone (CORTAID) 1 % cream   Topical BID Elwyn Reach, MD   Given at 02/02/21 1820    hydrOXYzine (VISTARIL) capsule 100 mg  100 mg Oral QHS Dixon Boos B, MD   100 mg at 02/02/21 2158     hydrOXYzine (VISTARIL) capsule 25 mg  25 mg Oral Q4H PRN Berneta Levins, MD        ibuprofen (ADVIL) tablet 800 mg  800 mg Oral TID PRN Berneta Levins, MD   800 mg at 02/03/21 1551    loperamide (IMODIUM) capsule 2 mg  2 mg Oral Q3H PRN Berneta Levins, MD        nitrofurantoin (macrocrystal-monohydrate) (MACROBID) capsule 100 mg  100 mg Oral Q12H SCH Singasani, Tessie Fass, MD   100 mg at 02/03/21 0853    nystatin (NYSTOP) powder   Topical BID Saddie Benders, MD   Given at 02/02/21 1820    OLANZapine (ZyPREXA) tablet 10 mg  10 mg Oral BID PRN Berneta Levins, MD        ondansetron [facility]) tablet 4 mg  4 mg Oral Q8H PRN Berneta Levins, MD        QUEtiapine (SEROquel) tablet 50 mg  50 mg Oral QHS PRN Berneta Levins, MD           Legal Status:    voluntary    Subjective  Findings:     Symptoms:  depression, suicidal thoughts, Cocaine abuse  Medication Side Effects:  None  Collateral History:   Provided  Family Involvement: Completed  Psychosocial Functioning: Improving  Attending Groups: yes    Objective Findings:    Alert, Well oriented AAM    Mental Status Evaluation:     Appearance:  age appropriate and overweight   Behavior:  normal   Speech:  normal pitch and normal volume   Mood:  normal   Affect:  mood-congruent   Thought Process:  normal   Thought Content:  normal   Sensorium:  person, place, time/date, situation, day of week, month of year, and year   Cognition:  grossly intact   Insight:  fair   Judgment:  fair       Neuro Vegetative Functions:     Energy: Energy level is good.  The patient is able to perform most usual functions.  Sleep: Sleep pattern was reported as intact and regular with no initial, middle or late insomnia  Appetite: Appetite is reported to be intact, with no significant changes in weight.    Vital Signs:     Vitals:    02/02/21 1952   BP: 96/62   Pulse: 86   Resp: 16   Temp: 98.1 F (36.7 C)   SpO2: 97%        Laboratory Assessment:     Results       ** No results found for the last 24  hours. **             Assessment:   Clinical formulation:  60 years old AAM with Cocaine abuse was admitted for suicidal thoughts.     He has capacity to make medical decisions. He identified his mother Kayshaun Polanco [phone] as his next of kin    Diagnoses:     Axis I: Bipolar Disorder, depression, Cocaine abuse, Substance induced Mood Disorder  Axis UJ:WJXBJ  Axis III: See problem list in the medical record  Axis IV: Other psychosocial or environmental problems  Axis V:41-50: serious symptoms.    Plan:     Medication Plan: Continue  -Abilify 10 Mg Daily  -Lexapro 10 Mg Daily  -Vistaryl 100 Mg at bed time    Medication Education: Provided    Medical work-up plan/testing:  Completed    Plan for Family Involvement:  Yes    Aftercare Plan: Discharge to home    Ongoing hospitalization required for Danger to self    Other Providers Contact Information and Dates Contacted: Per primary team    On this admission patient educated about and provided input into their treatment plan.  Patient understands potential risks and benefits of proposed treatment plan. -    Expected length of stay: 3-5 days  Total floor time: 25 Minutes  At least 50% in coordination of care and counseling:Yes        Signed by: Berneta Levins, MD, MD   02/03/21 4:14 PM

## 2021-02-03 NOTE — Plan of Care (Signed)
Problem: At Risk for Suicide AS EVIDENCED BY...  Goal: [name] reported his goal is to " find meds that work for him maybe try ECT"  Outcome: Progressing  Goal: [name] will remain safe during hospitalization  02/03/2021 1242 by Lenna Sciara, RN  Outcome: Progressing     Problem: Mood Disorder  Goal: [name] will report  improved mood  02/03/2021 1242 by Lenna Sciara, RN  Outcome: Progressing    Goal: [name] will verbalize reduction in hallucinations during his hosptalization  02/03/2021 1242 by Lenna Sciara, RN  Outcome: Progressing  Patient was found this morning in the milieu socializing with other patients and reading his bible. Patient reported that his mood was "improving" and that he slept well. Pt denied HI/VH but endorsed SI and auditory hallucinations. He says that he is a "cutter" and that is how he thinks about harming himself. Patient has not taken any action towards this and was encouraged to talk to staff when he feels these urges. Pt verbalized understanding. He did not specify what the voices were saying, only that they are "demons" that come from his childhood since his mother was a Jehovah's Witness and his father was a Programmer, multimedia. When asked if he felt the medications were working, pt stated that "I have been on medications since I was 13 so it takes a week or so for them to work and I have only been on these since Thursday." Pt endorsed pain from arthritis in his knees and was given 800 mg ibuprofen PRN along with his scheduled medications. Pt tolerated medications well and reported improvement in pain upon reassessment. Pt was seen socializing and watching movies in the milieu for the majority of the day.

## 2021-02-03 NOTE — Progress Notes (Signed)
Pt slept throughout the night. No issue noted. Safety maintained.

## 2021-02-03 NOTE — Progress Notes (Signed)
Checklist instructions: Score the patient once a shift and as needed. Absence of a behavior results in a score of 0. Presence or increase in the display of a behavior results in a score of 1. Maximum score (SUM) possible is 6. If a behavior is at baseline for a patient, e.g. the patient is normally confused, this will result in a score of 0. An increase in confusion will result in a score of 1.      [name] is being assessed under the Broset Violence Checklist for any escalations in potentially violent or harmful behavior.     Date of Admission:  01/30/2021                                                                         Date of Assessment: 02/03/2021   Assessment Time 0900      Confused 0      Irritable 0      Boisterous 0      Verbal Threats 0      Physical Threats 0      Attacking Objects 0      SUM 0          TW attempted the following interventions based on the patient's sum total score assessed on the BVC: 0, monitoring.    Should the patient require multiple interventions or fails to engage in de-escalation with staff, please review the plan for managing the care of an escalated patient.  Please do the following:  1.  Immediately notify Security and Nursing Administrative Supervisor/Director to respond  2.  Conduct team huddle to rule out clinical causes for escalated behavior  3.  Review the note authored by the Associate Professor for additional information  4.  Document specific behaviors observed  5.  Consider SAFE Team call for additional resources              The Broset Violence Checklist (BVC) is a 6 item inventory that was designed to assist in the prediction of imminent violent behavior (24 hours perspective) in healthcare and other sectors where workplace violence is acknowledged as a serious problem.   The BVC does not give instructions on what to do or what interventions to provide when a violent episode occurs; it is a took meant to aid in the risk assessment process.  Interventions are based on the individual, the environment, and cultural sensitivity indicators.

## 2021-02-04 MED ORDER — ACETAMINOPHEN 500 MG PO TABS
1000.0000 mg | ORAL_TABLET | Freq: Every evening | ORAL | Status: DC
Start: 2021-02-04 — End: 2021-02-09
  Administered 2021-02-04 – 2021-02-08 (×5): 1000 mg via ORAL
  Filled 2021-02-04 (×5): qty 2

## 2021-02-04 MED ORDER — ACETAMINOPHEN 500 MG PO TABS
1000.0000 mg | ORAL_TABLET | Freq: Three times a day (TID) | ORAL | Status: DC | PRN
Start: 2021-02-04 — End: 2021-02-04
  Administered 2021-02-04: 1000 mg via ORAL
  Filled 2021-02-04: qty 2

## 2021-02-04 MED ORDER — HYDROXYZINE PAMOATE 25 MG PO CAPS
25.0000 mg | ORAL_CAPSULE | Freq: Three times a day (TID) | ORAL | Status: DC
Start: 2021-02-04 — End: 2021-02-09
  Administered 2021-02-04 – 2021-02-09 (×16): 25 mg via ORAL
  Filled 2021-02-04 (×16): qty 1

## 2021-02-04 MED ORDER — ACETAMINOPHEN 325 MG PO TABS
650.0000 mg | ORAL_TABLET | Freq: Three times a day (TID) | ORAL | Status: DC | PRN
Start: 2021-02-04 — End: 2021-02-09
  Administered 2021-02-04 – 2021-02-09 (×11): 650 mg via ORAL
  Filled 2021-02-04 (×12): qty 2

## 2021-02-04 MED ORDER — ARIPIPRAZOLE 5 MG PO TABS
15.0000 mg | ORAL_TABLET | Freq: Every day | ORAL | Status: DC
Start: 2021-02-05 — End: 2021-02-06
  Administered 2021-02-05 – 2021-02-06 (×2): 15 mg via ORAL
  Filled 2021-02-04 (×3): qty 1

## 2021-02-04 NOTE — Progress Notes (Signed)
Checklist instructions: Score the patient once a shift and as needed. Absence of a behavior results in a score of 0. Presence or increase in the display of a behavior results in a score of 1. Maximum score (SUM) possible is 6. If a behavior is at baseline for a patient, e.g. the patient is normally confused, this will result in a score of 0. An increase in confusion will result in a score of 1.      [name] is being assessed under the Broset Violence Checklist for any escalations in potentially violent or harmful behavior.     Date of Admission:        01/30/21                Date of Assessment: 02/04/21   Assessment Time 2000      Confused 1      Irritable 0      Boisterous 0      Verbal Threats 0      Physical Threats 0      Attacking Objects 0      SUM 1          TW attempted the following interventions based on the patient's sum total score assessed on the BVC: 1= Monitoring, 2= Verbal De-escalation, and 3= Decrease Stimulation.    Should the patient require multiple interventions or fails to engage in de-escalation with staff, please review the plan for managing the care of an escalated patient.  Please do the following:  1.  Immediately notify Security and Nursing Administrative Supervisor/Director to respond  2.  Conduct team huddle to rule out clinical causes for escalated behavior  3.  Review the note authored by the Associate Professor for additional information  4.  Document specific behaviors observed  5.  Consider SAFE Team call for additional resources    The Broset Violence Checklist (BVC) is a 6 item inventory that was designed to assist in the prediction of imminent violent behavior (24 hours perspective) in healthcare and other sectors where workplace violence is acknowledged as a serious problem.   The BVC does not give instructions on what to do or what interventions to provide when a violent episode occurs; it is a took meant to aid in the risk assessment process. Interventions are  based on the individual, the environment, and cultural sensitivity indicators.

## 2021-02-04 NOTE — Plan of Care (Signed)
Problem: At Risk for Suicide AS EVIDENCED BY...  Goal: [name] will remain safe during hospitalization  Outcome: Progressing  Goal: Suicide Alert Level High  Outcome: Progressing     Problem: Safety  Goal: [name] will be free from infection during hospitalization  Outcome: Progressing     Problem: Mood Disorder  Goal: [name] will report  improved mood  Outcome: Progressing      Writer assumed care of pt in the milieu while he was watching TV and interacting with peers. He was AOX4, mood elevated with grandiose thought content. Pt kept repeating "I am [name], black power" and would raise his rt hand while saying these words. He however denied, anxiety, SI, SIB, HI and AVH. He was med compliant with his bed time med. VSS, denied pain. Q15 min safety rounds in place.

## 2021-02-04 NOTE — Nursing Progress Note (Signed)
Checklist instructions: Score the patient once a shift and as needed. Absence of a behavior results in a score of 0. Presence or increase in the display of a behavior results in a score of 1. Maximum score (SUM) possible is 6. If a behavior is at baseline for a patient, e.g. the patient is normally confused, this will result in a score of 0. An increase in confusion will result in a score of 1.      [name] is being assessed under the Broset Violence Checklist for any escalations in potentially violent or harmful behavior.     Date of Admission:                                                                         Date of Assessment:    Assessment Time 0800      Confused 0      Irritable 0      Boisterous 0      Verbal Threats 0      Physical Threats 0      Attacking Objects 0      SUM 0          TW attempted the following interventions based on the patient's sum total score assessed on the BVC: 7= Continuous Observation.       Should the patient require multiple interventions or fails to engage in de-escalation with staff, please review the plan for managing the care of an escalated patient.  Please do the following:  1.  Immediately notify Security and Nursing Administrative Supervisor/Director to respond  2.  Conduct team huddle to rule out clinical causes for escalated behavior  3.  Review the note authored by the Associate Professor for additional information  4.  Document specific behaviors observed  5.  Consider SAFE Team call for additional resources              The Broset Violence Checklist (BVC) is a 6 item inventory that was designed to assist in the prediction of imminent violent behavior (24 hours perspective) in healthcare and other sectors where workplace violence is acknowledged as a serious problem.   The BVC does not give instructions on what to do or what interventions to provide when a violent episode occurs; it is a took meant to aid in the risk assessment process. Interventions  are based on the individual, the environment, and cultural sensitivity indicators.

## 2021-02-04 NOTE — Plan of Care (Signed)
Problem: At Risk for Suicide AS EVIDENCED BY...  Goal: Zackarey reported his goal is to " find meds that work for him maybe try ECT"  Outcome: Progressing  Flowsheets (Taken 02/04/2021 2257)  Pt will identify long and short term treatment goals based on individual needs and strengths:   Assist to identify goals for treatment based on individual needs and strengths   Assist patient to define criteria for discharge   Assist patient to sign acknowledgement of treatment plan participation     Problem: Mood Disorder  Goal: Thurl will report  improved mood  Outcome: Progressing  Flowsheets (Taken 02/04/2021 2257)  Reports improved mood:   Encourage the patient to verbalize feelings of anxiety, anger, and fears   Frequent brief 1:1 conversations with the patient to assess mood /coping response    Received the patient in the milieu while he was watching TV, reading his bible, and interacting with peers. He was AOX4, his mood elevated with grandiose thought content. He denied, anxiety, SI, SIB, HI and AVH. He was meds compliant.  The patient is denied SI, HI, AVH and pain.  eating, hydrating, and slept well. will continue to monitor for safety and needs.
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
## 2021-02-04 NOTE — Progress Notes (Signed)
Psychiatry Progress Note  (Level 1 = Problem Focused, Level 2 = Expanded Problem Focused, Level 3 = Detailed)    Patient Name: [name]            Current Date/Time:  9/12/20223:41 PM  MRN:  [id number]                            Attending Physician: Elwyn Reach, MD  DOB: 07-16-1960                                Gender: male                              I. History   Informants: pt  A. Chief Complaint or Reason for Admission       SI  B.History of Present Illness: Interval History     (Symptoms and qualifiers:1 for Level 1, 2 for Level 2 and 3+ for Level 3)  60 yo AAM w/ bipolar do and PTSD and polysubstance abuse admitted voluntarily for SI.  He has an extensive psych history and was previously followed by an ACT team.  He has had multiple suicide attempts.  He has been off meds for 2 weeks, lost them, but says that the latuda he was taking basically stopped working anyway    Endorses thoughts of self harm and wishing he wouldn't wake up  He shared that he speaks publicly about his experiences and that in a way he feels like that is why is here on earth, to help other people liearn from his trauma    Sleeping 3-4 hrs a night  Mostly keeping to himself  Having bad anxiety and feeling irritable  Says he has a nice roommate but it is very hard for him to share his space like that because of his past trauma  His family wants him to come to [facility] but he says he can't.  It's too triggerting for him to be down there and while they love him they don't really understand what he has been through.  He says he is from a family of black professionals, his mother is a Clinical research associate and his aunt was a Warden/ranger and he never even graduated HS but says he is self taught on a lot of things but feels like in a way because he does not have the formal education they have, they see him differently and it makes him feel small    Medications:   Current Medications       Scheduled       Medication Ordered Dose/Rate, Route,  Frequency Last Action    acetaminophen (TYLENOL) tablet 1,000 mg 1,000 mg, PO, QHS Ordered    ARIPiprazole (ABILIFY) tablet 15 mg 15 mg, PO, Daily Ordered    escitalopram (LEXAPRO) tablet 10 mg 10 mg, PO, Daily Given, 10 mg at 09/12 0938    fluticasone (FLONASE) 50 MCG/ACT nasal spray 1 spray 1 spray, EACH NARE, Daily Given, 1 spray at 09/11 0853    hydrocortisone (CORTAID) 1 % cream No Dose/Rate, TP, BID Given, No Dose/Rate at 09/10 1820    hydrOXYzine (VISTARIL) capsule 100 mg 100 mg, PO, QHS Given, 100 mg at 09/11 2132    hydrOXYzine (VISTARIL) capsule 25 mg 25 mg, PO, TID MEALS Ordered    nitrofurantoin (macrocrystal-monohydrate) (MACROBID) capsule  100 mg 100 mg, PO, Q12H SCH Given, 100 mg at 09/12 0939    nystatin (NYSTOP) powder No Dose/Rate, TP, BID Given, No Dose/Rate at 09/10 1820              PRN       Medication Ordered Dose/Rate, Route, Frequency Last Action    acetaminophen (TYLENOL) tablet 650 mg 650 mg, PO, TID PRN Ordered    alum & mag hydroxide-simethicone (MAALOX PLUS) 200-200-20 mg/5 mL suspension 30 mL 30 mL, PO, Q6H PRN Ordered    benzocaine-menthol (CEPACOL/CHLORASEPTIC) lozenge 1 lozenge 1 lozenge, BU, Q1H PRN Given, 1 lozenge at 09/07 1959    diphenhydrAMINE (BENADRYL) capsule 50 mg (Or Linked Group #1) 50 mg, PO, TID PRN Ordered    diphenhydrAMINE (BENADRYL) injection 50 mg (Or Linked Group #1) 50 mg, IM, TID PRN Ordered    docusate sodium (COLACE) capsule 100 mg 100 mg, PO, BID PRN Ordered    hydrOXYzine (VISTARIL) capsule 25 mg 25 mg, PO, Q4H PRN Ordered    ibuprofen (ADVIL) tablet 800 mg 800 mg, PO, TID PRN Given, 800 mg at 09/12 0407    loperamide (IMODIUM) capsule 2 mg 2 mg, PO, Q3H PRN Ordered    OLANZapine (ZyPREXA) tablet 10 mg 10 mg, PO, BID PRN Ordered    ondansetron (ZOFRAN) tablet 4 mg 4 mg, PO, Q8H PRN Ordered    QUEtiapine (SEROquel) tablet 50 mg 50 mg, PO, QHS PRN Ordered                    II. Examination   Vital signs reviewed:   Blood pressure 111/71, pulse 89, temperature  98.1 F (36.7 C), temperature source Oral, resp. rate 16, height 1.905 m (6\' 3" ), weight 104.3 kg (230 lb), SpO2 95 %.     Mental Status Exam  (Level 1 is 1-5, Level 2 is 6-8, Level 3 is 9+)  General appearance: Appears chronological age, Good hygiene and grooming is good, no body odor  Attitude/Behavior: Calm, Cooperative and Eye Contact is  Good  Motor: No abnormalities noted  Gait: slight limp  Muscle strength and tone: Grossly intact  Speech:   Spontaneous: Yes  Rate, volume and tone are normal  No increased latency  Mood: depressed  Affect:  Mood congruent but w/ some range of affect  Thought Process:   Coherent: Yes  Logical:  Yes  Associations: Goal-directed  Thought Content:  Intermittent suicidal thoughts  Perceptions:  No AH/VH  Does not seem to be responding to internal stimuli  Insight: excellent  Judgment: Fair  Cognition:   Level of Consciousness: Intact  Orientation: Intact to self, place and time  Recent Memory: Intact  Remote Memory: Intact  Attention and Concentration: Intact      Psychiatric / Cognitive Instruments: None    Pertinent Physical Exam: Not relevant to current chief complaint/reason for admission    Imaging / EKG / Labs: Labs in the last 24 hours  Results       ** No results found for the last 24 hours. **            III. Assessment and Plan (Medical Decision Making)   1. I certify that this patient requires inpatient hospitalization due to acute risk to self     2. Psychiatric Diagnoses  Bipolar disorder, mixed episode  PTSD  Medical: balanitis           3.Labs reviewed and compared to prior labs in the system. Past  medical records reviewed. Coordination of care was discussed with inpatient team and as available with the outpatient team.     4. Assessment / Impression  60 yo AAM w/ history of PTSD and bipolar do admitted w/ Si in the context of recent relapse on etoh and not taking his meds.  His insight is extremely good and he is motivated to get back on meds and to seek out formal  tx for his etoh use.  He has a history of multiple suicide attempts in the past.     Suicide Risk Assessment  Suicide Thoughts / Behaviors: Yes  Current Plan: Yes  Access to firearm: No  Past suicide attempts: Yes  Diagnosis of MDD, BPAD, Schizophrenia, Cluster B PD, Anorexia, Substance Use: Yes, bpad.  Emotional Trauma: Yes  Family history of suicide: No  Demographic Risk Factors: Yes, gender, singkle, etoh use.     Plan / Recommendations:  Patient admitted to inpatient psychiatric unit on voluntary status for further diagnostic and safety evaluations, clinical stabilization with psychotropic treatment and non-pharmacological interventions, and for discharge planning.     Biological Plan:  9/8: started lexapro 10mg  and abilify 10mg  for ptsd and bipolar   Vistaril for sleep and PRN for anxiety   9/12: increase abilify to 15mg  and add scheduled vistaril through out the day    Discussed case w/ medicine, they will manage  balanitis/possible UTI    Psychosocial Plan:  Individual therapy: Psycho-education and psychotherapy of the following modalities will be provided during daily visits:Supportive and Cognitive Behavioral Therapy  Group and milieu therapies: daily per unit's schedule.  Social Work intervention will be provided for discharge planning and will include assistance with follow-up psychiatric care.       Plan for Family Involvement: will call his mother tomorrow [phone]  Other Providers Contact Information and Dates Contacted: No Updates    Total Attending time spent 35 minutes (floor time) with more than 50 percent of time in direct patient contact, coordinating care and counseling.    Signed by: Elwyn Reach, MD  02/04/2021  3:41 PM    Pt has capacity to make decisions  Next of kin:  mother, Larz Mark, [phone]

## 2021-02-04 NOTE — Plan of Care (Signed)
Problem: At Risk for Suicide AS EVIDENCED BY...  Goal: [name] reported his goal is to " find meds that work for him maybe try ECT"  Outcome: Progressing by patient denying suicidal ideation at the hospital.   Goal: [name] will remain safe during hospitalization  Outcome: Progressing as evidenced by patient having no injury or engaging in harmful conduct.   Goal: Suicide Alert Level High  Outcome: Progressing as evidenced by low indication for suicide or harmful self conduct - patient denied present suicidal ideation.   Goal: [name] will Identify and participate in at least one supportive program therapies daily  Outcome: Progressing as evidenced by patient's attendance at groups per therapist's notes.  Goal: [name] will complete discharge safety and recovery plan within his stay on the unit  Outcome: Progressing as evidenced by patient making progress towards discharge by taking medications, participating in groups and reporting improvements in symptoms.      Problem: Safety  Goal: [name] will be free from infection during hospitalization  Outcome: Progressing as evidenced by absence of hospital-acquired infection; but patient is recovering from penile infection unrelated to hospitalization.     Problem: Discharge Barriers  Goal: [name] will be discharged home or other facility with appropriate resources  Outcome: Progressing as evidenced by patient making progress on other fronts necessary for a successful discharge and a referral to a case worker who working on his discharge needs.      Problem: Mood Disorder  Goal: [name] will report  improved mood  Outcome: Progressing as evidenced by patient expressing a desire to get better and taking his medications.   Goal: [name] will verbalize reduction in hallucinations during his hosptalization  Outcome: Not progressing as evidenced by patient reporting auditory hallucination telling him "the same thing they tell the prophets - kill the demons who tell me to  hurt myself, kill myself and cut myself" Patient stated he would not act on the voices because they were the voices of demons. Patient referred to himself as  Jew, and expressed adherence to different faiths because "there only one god with different labels."       Problem: Addiction to alcohol AS EVIDENCED BY:  Goal: [name] will  develop a discharge plan during his stay on the unit   Outcome: Progressing as evidenced by patient signing treatment plan, working with the treatment team on his plan and a case worker on discharge plan including alcohol treatment referral.   Patient still hears voices, but he was able to tolerate the voices and not act on them. Patient took medications and interacted with staff and peers. He remains safe and did not engage in self-injurious behavior or harm towards others.

## 2021-02-05 NOTE — Progress Notes (Signed)
Checklist instructions: Score the patient once a shift and as needed. Absence of a behavior results in a score of 0. Presence or increase in the display of a behavior results in a score of 1. Maximum score (SUM) possible is 6. If a behavior is at baseline for a patient, e.g. the patient is normally confused, this will result in a score of 0. An increase in confusion will result in a score of 1.      [name] is being assessed under the Broset Violence Checklist for any escalations in potentially violent or harmful behavior.     Date of Admission:            01/30/21                          Date of Assessment: 02/05/21   Assessment Time 2000      Confused 0      Irritable 0      Boisterous 0      Verbal Threats 0      Physical Threats 0      Attacking Objects 0      SUM           TW attempted the following interventions based on the patient's sum total score assessed on the BVC: 1= Monitoring, 2= Verbal De-escalation, and 3= Decrease Stimulation.  Should the patient require multiple interventions or fails to engage in de-escalation with staff, please review the plan for managing the care of an escalated patient.  Please do the following:  1.  Immediately notify Security and Nursing Administrative Supervisor/Director to respond  2.  Conduct team huddle to rule out clinical causes for escalated behavior  3.  Review the note authored by the Associate Professor for additional information  4.  Document specific behaviors observed  5.  Consider SAFE Team call for additional resources    The Broset Violence Checklist (BVC) is a 6 item inventory that was designed to assist in the prediction of imminent violent behavior (24 hours perspective) in healthcare and other sectors where workplace violence is acknowledged as a serious problem.   The BVC does not give instructions on what to do or what interventions to provide when a violent episode occurs; it is a took meant to aid in the risk assessment process.  Interventions are based on the individual, the environment, and cultural sensitivity indicators.

## 2021-02-05 NOTE — Plan of Care (Signed)
[name]  Patient slept well no behavioral issue noted overnight. No PRN given during this shift. At [phone] Patient came at the nursing station and report headaches and request and received ibuprofen 650 mg back to sleep. Will continue to monitor for safety and needs.

## 2021-02-05 NOTE — Nursing Progress Note (Signed)
Checklist instructions: Score the patient once a shift and as needed. Absence of a behavior results in a score of 0. Presence or increase in the display of a behavior results in a score of 1. Maximum score (SUM) possible is 6. If a behavior is at baseline for a patient, e.g. the patient is normally confused, this will result in a score of 0. An increase in confusion will result in a score of 1.      [name] is being assessed under the Broset Violence Checklist for any escalations in potentially violent or harmful behavior.     Date of Admission:                                                                         Date of Assessment:    Assessment Time 0800      Confused 0      Irritable 0      Boisterous 0      Verbal Threats 0      Physical Threats 0      Attacking Objects 0      SUM 0          TW attempted the following interventions based on the patient's sum total score assessed on the BVC: 1= Monitoring.      Should the patient require multiple interventions or fails to engage in de-escalation with staff, please review the plan for managing the care of an escalated patient.  Please do the following:  1.  Immediately notify Security and Nursing Administrative Supervisor/Director to respond  2.  Conduct team huddle to rule out clinical causes for escalated behavior  3.  Review the note authored by the RN or Team Member for additional information  4.  Document specific behaviors observed  5.  Consider SAFE Team call for additional resources              The Broset Violence Checklist (BVC) is a 6 item inventory that was designed to assist in the prediction of imminent violent behavior (24 hours perspective) in healthcare and other sectors where workplace violence is acknowledged as a serious problem.   The BVC does not give instructions on what to do or what interventions to provide when a violent episode occurs; it is a took meant to aid in the risk assessment process. Interventions are based on  the individual, the environment, and cultural sensitivity indicators.

## 2021-02-05 NOTE — Progress Notes (Signed)
SW met with patient and discussed patient's discharge plan. Patient reported he has been residing in [place] but still has [place] Medicaid. Patient expressed he was interested in a step down program but is looking for a program that is approximately 1-2 weeks long. Patient expressed interest in [facility]. SW informed patient SW would follow up to inquire if patient could be considered although patient has [place] Medicaid.

## 2021-02-05 NOTE — Plan of Care (Signed)
Problem: At Risk for Suicide AS EVIDENCED BY...  Goal: [name] reported his goal is to " find meds that work for him maybe try ECT"  Outcome: Progressing  Goal: [name] will remain safe during hospitalization  Outcome: Progressing  Goal: Suicide Alert Level High  Outcome: Progressing  Goal: [name] will Identify and participate in at least one supportive program therapies daily  Outcome: Progressing  Goal: [name] will complete discharge safety and recovery plan within his stay on the unit  Outcome: Progressing     Problem: Safety  Goal: [name] will be free from infection during hospitalization  Outcome: Progressing     Problem: Discharge Barriers  Goal: [name] will be discharged home or other facility with appropriate resources  Outcome: Progressing   Assumed patient's care at 0730, he was sitting in the lobby watching TV with selected peers. He denied SI/HI,VH/AH  but c/o and requested Tylenol 650 mg po prn for c/o neck pain at 1221. Patient verbalized effect of med at 1321. He is med compliant, he attended groups, he eats, hydrates well, he makes his needs known to staff and he maintains safety, suicidal and fall precautions maintained. No distress noted and staff will continue to offer support as needed.

## 2021-02-05 NOTE — Progress Notes (Signed)
Psychiatry Progress Note  (Level 1 = Problem Focused, Level 2 = Expanded Problem Focused, Level 3 = Detailed)    Patient Name: [name]            Current Date/Time:  9/13/202210:26 AM  MRN:  [id number]                            Attending Physician: Elwyn Reach, MD  DOB: [birth year]                                Gender: male                              I. History   Informants: pt  A. Chief Complaint or Reason for Admission       SI  B.History of Present Illness: Interval History     (Symptoms and qualifiers:1 for Level 1, 2 for Level 2 and 3+ for Level 3)  60 yo AAM w/ bipolar do and PTSD and polysubstance abuse admitted voluntarily for SI.  He has an extensive psych history and was previously followed by an ACT team.  He has had multiple suicide attempts.  He has been off meds for 2 weeks, lost them, but says that the latuda he was taking basically stopped working anyway    Called his mother [phone]  Updated her on plan of care   She said her biggest issue previously is that doctors don't keep in the hospital long enough or don't understand what type of trauma or suffering he has been through.  She appreciated the call and was glad to hear he is in good hands.  I told her I would touch base with her every so often during his stay.    Pt actually made some jokes today and laughed for the first time, he admitted that today is the first day he really feels like he can do that.  He aprpeciated that I called his mom.  He told me that he really does not fit in with anyone and that loneliness is painful and drives him to abuse substances.    He thinks he is feeling better because he is starting to finally sleep a little better.  Spends a lot of time reading his bible and Azzie Roup, says that gives him purpose and when he strays from god is when he relapses.    Still having intermittent SI.  No AH/VH  Compliant w/ meds  No beh issues  Sore throat improved w/ tylenol    Medications:   Current Medications        Scheduled       Medication Ordered Dose/Rate, Route, Frequency Last Action    acetaminophen (TYLENOL) tablet 1,000 mg 1,000 mg, PO, QHS Given, 1,000 mg at 09/12 2135    ARIPiprazole (ABILIFY) tablet 15 mg 15 mg, PO, Daily Given, 15 mg at 09/13 0843    escitalopram (LEXAPRO) tablet 10 mg 10 mg, PO, Daily Given, 10 mg at 09/13 0846    fluticasone (FLONASE) 50 MCG/ACT nasal spray 1 spray 1 spray, EACH NARE, Daily Given, 1 spray at 09/13 0924    hydrocortisone (CORTAID) 1 % cream No Dose/Rate, TP, BID Given, No Dose/Rate at 09/13 0924    hydrOXYzine (VISTARIL) capsule 100 mg 100 mg, PO, QHS Given, 100 mg at  09/12 2134    hydrOXYzine (VISTARIL) capsule 25 mg 25 mg, PO, TID MEALS Given, 25 mg at 09/13 0843    nitrofurantoin (macrocrystal-monohydrate) (MACROBID) capsule 100 mg 100 mg, PO, Q12H SCH Given, 100 mg at 09/13 1025    nystatin (NYSTOP) powder No Dose/Rate, TP, BID Given, No Dose/Rate at 09/13 0924              PRN       Medication Ordered Dose/Rate, Route, Frequency Last Action    acetaminophen (TYLENOL) tablet 650 mg 650 mg, PO, TID PRN Given, 650 mg at 09/13 0427    alum & mag hydroxide-simethicone (MAALOX PLUS) 200-200-20 mg/5 mL suspension 30 mL 30 mL, PO, Q6H PRN Ordered    benzocaine-menthol (CEPACOL/CHLORASEPTIC) lozenge 1 lozenge 1 lozenge, BU, Q1H PRN Given, 1 lozenge at 09/07 1959    diphenhydrAMINE (BENADRYL) capsule 50 mg (Or Linked Group #1) 50 mg, PO, TID PRN Ordered    diphenhydrAMINE (BENADRYL) injection 50 mg (Or Linked Group #1) 50 mg, IM, TID PRN Ordered    docusate sodium (COLACE) capsule 100 mg 100 mg, PO, BID PRN Ordered    hydrOXYzine (VISTARIL) capsule 25 mg 25 mg, PO, Q4H PRN Ordered    ibuprofen (ADVIL) tablet 800 mg 800 mg, PO, TID PRN Given, 800 mg at 09/12 0407    loperamide (IMODIUM) capsule 2 mg 2 mg, PO, Q3H PRN Ordered    OLANZapine (ZyPREXA) tablet 10 mg 10 mg, PO, BID PRN Ordered    ondansetron (ZOFRAN) tablet 4 mg 4 mg, PO, Q8H PRN Ordered    QUEtiapine (SEROquel) tablet  50 mg 50 mg, PO, QHS PRN Ordered                    II. Examination   Vital signs reviewed:   Blood pressure 112/67, pulse 94, temperature 97.9 F (36.6 C), temperature source Oral, resp. rate 17, height 1.905 m (6\' 3" ), weight 109.1 kg (240 lb 9.6 oz), SpO2 99 %.     Mental Status Exam  (Level 1 is 1-5, Level 2 is 6-8, Level 3 is 9+)  General appearance: Appears chronological age, Good hygiene and grooming is good, no body odor  Attitude/Behavior: Calm, Cooperative and Eye Contact is  Good  Motor: No abnormalities noted  Gait: slight limp  Muscle strength and tone: Grossly intact  Speech:   Spontaneous: Yes  Rate, volume and tone are normal  No increased latency  Mood: "ok"  Affect:  Toni Arthurs today  Thought Process:   Coherent: Yes  Logical:  Yes  Associations: Goal-directed  Thought Content:  Intermittent suicidal thoughts  Perceptions:  No AH/VH  Does not seem to be responding to internal stimuli  Insight: excellent  Judgment: Fair  Cognition:   Level of Consciousness: Intact  Orientation: Intact to self, place and time  Recent Memory: Intact  Remote Memory: Intact  Attention and Concentration: Intact      Psychiatric / Cognitive Instruments: None    Pertinent Physical Exam: Not relevant to current chief complaint/reason for admission    Imaging / EKG / Labs: Labs in the last 24 hours  Results       ** No results found for the last 24 hours. **            III. Assessment and Plan (Medical Decision Making)   1. I certify that this patient requires inpatient hospitalization due to acute risk to self     2. Psychiatric Diagnoses  Bipolar  disorder, mixed episode  PTSD  Medical: balanitis           3.Labs reviewed and compared to prior labs in the system. Past medical records reviewed. Coordination of care was discussed with inpatient team and as available with the outpatient team.     4. Assessment / Impression  60 yo AAM w/ history of PTSD and bipolar do admitted w/ Si in the context of recent relapse on etoh and not  taking his meds.  His insight is extremely good and he is motivated to get back on meds and to seek out formal tx for his etoh use.  He has a history of multiple suicide attempts in the past.     Suicide Risk Assessment  Suicide Thoughts / Behaviors: Yes  Current Plan: Yes  Access to firearm: No  Past suicide attempts: Yes  Diagnosis of MDD, BPAD, Schizophrenia, Cluster B PD, Anorexia, Substance Use: Yes, bpad.  Emotional Trauma: Yes  Family history of suicide: No  Demographic Risk Factors: Yes, gender, singkle, etoh use.     Plan / Recommendations:  Patient admitted to inpatient psychiatric unit on voluntary status for further diagnostic and safety evaluations, clinical stabilization with psychotropic treatment and non-pharmacological interventions, and for discharge planning.     Biological Plan:  9/8: started lexapro 10mg  and abilify 10mg  for ptsd and bipolar   Vistaril for sleep and PRN for anxiety   9/12: increase abilify to 15mg  and add scheduled vistaril through out the day    He seems to have responded well to yesterday's med changes.  Will likely increase abilify further in a couple days.    Discussed case w/ medicine, they will manage  balanitis/possible UTI    Psychosocial Plan:  Individual therapy: Psycho-education and psychotherapy of the following modalities will be provided during daily visits:Supportive and Cognitive Behavioral Therapy  Group and milieu therapies: daily per unit's schedule.  Social Work intervention will be provided for discharge planning and will include assistance with follow-up psychiatric care.       Plan for Family Involvement: will call his mother again in a few days [phone]  Other Providers Contact Information and Dates Contacted: No Updates    Total Attending time spent 35 minutes (floor time) with more than 50 percent of time in direct patient contact, coordinating care and counseling.    Signed by: Elwyn Reach, MD  02/05/2021  10:26 AM    Pt has capacity to make  decisions  Next of kin:  mother, Alanzo Lamb, [phone]

## 2021-02-05 NOTE — Progress Notes (Signed)
Patient attended most groups today. His goal was to "stay out of my room and attend groups."  He was pleasant and task focused in creative therapy and visible and social in the milieu.  Will  continue to encourage group attendance.

## 2021-02-06 MED ORDER — NITROFURANTOIN MONOHYD MACRO 100 MG PO CAPS
100.0000 mg | ORAL_CAPSULE | Freq: Two times a day (BID) | ORAL | Status: DC
Start: 2021-02-06 — End: 2021-02-08
  Administered 2021-02-06 – 2021-02-08 (×4): 100 mg via ORAL
  Filled 2021-02-06 (×5): qty 1

## 2021-02-06 MED ORDER — ARIPIPRAZOLE 10 MG PO TABS
20.0000 mg | ORAL_TABLET | Freq: Every day | ORAL | Status: DC
Start: 2021-02-07 — End: 2021-02-09
  Administered 2021-02-07 – 2021-02-09 (×3): 20 mg via ORAL
  Filled 2021-02-06 (×3): qty 2

## 2021-02-06 NOTE — Plan of Care (Signed)
Problem: At Risk for Suicide AS EVIDENCED BY...  Goal: [name] reported his goal is to " find meds that work for him maybe try ECT"  02/06/2021 1646 by Teryl Lucy, RN  Outcome: Progressing  02/06/2021 1643 by Teryl Lucy, RN  Outcome: Progressing  Goal: [name] will remain safe during hospitalization  02/06/2021 1646 by Teryl Lucy, RN  Outcome: Progressing  02/06/2021 1643 by Teryl Lucy, RN  Outcome: Progressing  Goal: Suicide Alert Level High  02/06/2021 1646 by Teryl Lucy, RN  Outcome: Progressing  02/06/2021 1643 by Teryl Lucy, RN  Outcome: Progressing  Goal: [name] will Identify and participate in at least one supportive program therapies daily  02/06/2021 1646 by Teryl Lucy, RN  Outcome: Progressing  02/06/2021 1643 by Teryl Lucy, RN  Outcome: Progressing  Goal: [name] will complete discharge safety and recovery plan within his stay on the unit  02/06/2021 1646 by Teryl Lucy, RN  Outcome: Progressing  02/06/2021 1643 by Teryl Lucy, RN  Outcome: Progressing     Problem: Safety  Goal: [name] will be free from infection during hospitalization  02/06/2021 1646 by Teryl Lucy, RN  Outcome: Progressing  02/06/2021 1643 by Teryl Lucy, RN  Outcome: Progressing     Problem: Discharge Barriers  Goal: [name] will be discharged home or other facility with appropriate resources  02/06/2021 1646 by Teryl Lucy, RN  Outcome: Progressing  02/06/2021 1643 by Teryl Lucy, RN  Outcome: Progressing     Problem: Mood Disorder  Goal: [name] will report  improved mood  02/06/2021 1646 by Teryl Lucy, RN  Outcome: Progressing  02/06/2021 1643 by Teryl Lucy, RN  Outcome: Progressing  Goal: [name] will verbalize reduction in hallucinations during his hosptalization  02/06/2021 1646 by Teryl Lucy, RN  Outcome: Not Progressing  02/06/2021 1643 by Teryl Lucy, RN  Outcome: Progressing     Problem: Addiction to alcohol AS EVIDENCED BY:  Goal: [name]  will  develop a discharge plan during his stay on the unit   02/06/2021 1646 by Teryl Lucy, RN  Outcome: Progressing  02/06/2021 1643 by Teryl Lucy, RN  Outcome: Progressing  Patient was resting in bed when this writer assumed his care at approximately 0730. His affect is blunted, his mood is depressed with a cooperative attitude. He reported , "I always feel suicidal, but not right now and I hear voices all the time." Patient is visible in the milieu appropriately interacting with selected peers. However, as per report from staff, a male peer alleged that, [name] gesture about smoking substance which resulted into verbal altercation at approximately 1600  and both patients were redirectable. Patient denied the allegation, he is med compliant, he eats, hydrates well, he maintains safety, suicidal and fall precautions maintained, he attended groups and he makes his needs known to staff. No distress noted and staff will continue to offer support as needed.

## 2021-02-06 NOTE — Progress Notes (Signed)
Patient attended all groups today.  His goal was "to get closer to my God so I can fight demons."  He was actively engaged in group therapy, and social and task focused in creative therapy.  He has been visible and social in the milieu.  Will continue to encourage group attendance.

## 2021-02-06 NOTE — Nursing Progress Note (Signed)
Checklist instructions: Score the patient once a shift and as needed. Absence of a behavior results in a score of 0. Presence or increase in the display of a behavior results in a score of 1. Maximum score (SUM) possible is 6. If a behavior is at baseline for a patient, e.g. the patient is normally confused, this will result in a score of 0. An increase in confusion will result in a score of 1.      [name] is being assessed under the Broset Violence Checklist for any escalations in potentially violent or harmful behavior.     Date of Admission:                                                                         Date of Assessment:    Assessment Time 0800      Confused 0      Irritable 0      Boisterous 0      Verbal Threats 0      Physical Threats 0      Attacking Objects 0      SUM 0          TW attempted the following interventions based on the patient's sum total score assessed on the BVC: 1= Monitoring.      Should the patient require multiple interventions or fails to engage in de-escalation with staff, please review the plan for managing the care of an escalated patient.  Please do the following:  1.  Immediately notify Security and Nursing Administrative Supervisor/Director to respond  2.  Conduct team huddle to rule out clinical causes for escalated behavior  3.  Review the note authored by the Associate Professor for additional information  4.  Document specific behaviors observed  5.  Consider SAFE Team call for additional resources              The Broset Violence Checklist (BVC) is a 6 item inventory that was designed to assist in the prediction of imminent violent behavior (24 hours perspective) in healthcare and other sectors where workplace violence is acknowledged as a serious problem.   The BVC does not give instructions on what to do or what interventions to provide when a violent episode occurs; it is a took meant to aid in the risk assessment process. Interventions are based on  the individual, the environment, and cultural sensitivity indicators.

## 2021-02-06 NOTE — Progress Notes (Signed)
Checklist instructions: Score the patient once a shift and as needed. Absence of a behavior results in a score of 0. Presence or increase in the display of a behavior results in a score of 1. Maximum score (SUM) possible is 6. If a behavior is at baseline for a patient, e.g. the patient is normally confused, this will result in a score of 0. An increase in confusion will result in a score of 1.      [name] is being assessed under the Broset Violence Checklist for any escalations in potentially violent or harmful behavior.     Date of Admission:         01/30/21                           Date of Assessment: 02/06/21   Assessment Time 2000      Confused 0      Irritable 0      Boisterous 0      Verbal Threats 0      Physical Threats 0      Attacking Objects 0      SUM 0          TW attempted the following interventions based on the patient's sum total score assessed on the BVC: 1= Monitoring, 2= Verbal De-escalation, and 3= Decrease Stimulation.  Should the patient require multiple interventions or fails to engage in de-escalation with staff, please review the plan for managing the care of an escalated patient.  Please do the following:  1.  Immediately notify Security and Nursing Administrative Supervisor/Director to respond  2.  Conduct team huddle to rule out clinical causes for escalated behavior  3.  Review the note authored by the Associate Professor for additional information  4.  Document specific behaviors observed  5.  Consider SAFE Team call for additional resources  The Broset Violence Checklist (BVC) is a 6 item inventory that was designed to assist in the prediction of imminent violent behavior (24 hours perspective) in healthcare and other sectors where workplace violence is acknowledged as a serious problem.   The BVC does not give instructions on what to do or what interventions to provide when a violent episode occurs; it is a took meant to aid in the risk assessment process.  Interventions are based on the individual, the environment, and cultural sensitivity indicators.

## 2021-02-06 NOTE — Progress Notes (Signed)
Psychiatry Progress Note  (Level 1 = Problem Focused, Level 2 = Expanded Problem Focused, Level 3 = Detailed)    Patient Name: [name]            Current Date/Time:  9/14/20221:06 PM  MRN:  [id number]                            Attending Physician: Elwyn Reach, MD  DOB: 1961-04-07                                Gender: male                              I. History   Informants: pt  A. Chief Complaint or Reason for Admission       SI  B.History of Present Illness: Interval History     (Symptoms and qualifiers:1 for Level 1, 2 for Level 2 and 3+ for Level 3)  60 yo AAM w/ bipolar do and PTSD and polysubstance abuse admitted voluntarily for SI.  He has an extensive psych history and was previously followed by an ACT team.  He has had multiple suicide attempts.  He has been off meds for 2 weeks, lost them, but says that the latuda he was taking basically stopped working anyway    Pt says still feeling anxious and irritable but feeling a little better.  Still having some thoughts of self harm  Eating well  Sleep is poor but improving  Doing his best to not isolate  Expressed appreciation of the help he is getting    Medications:   Current Medications       Scheduled       Medication Ordered Dose/Rate, Route, Frequency Last Action    acetaminophen (TYLENOL) tablet 1,000 mg 1,000 mg, PO, QHS Given, 1,000 mg at 09/13 2100    ARIPiprazole (ABILIFY) tablet 20 mg 20 mg, PO, Daily Ordered    escitalopram (LEXAPRO) tablet 10 mg 10 mg, PO, Daily Given, 10 mg at 09/14 0848    fluticasone (FLONASE) 50 MCG/ACT nasal spray 1 spray 1 spray, EACH NARE, Daily Given, 1 spray at 09/13 0924    hydrocortisone (CORTAID) 1 % cream No Dose/Rate, TP, BID Given, No Dose/Rate at 09/14 1000    hydrOXYzine (VISTARIL) capsule 100 mg 100 mg, PO, QHS Given, 100 mg at 09/13 2100    hydrOXYzine (VISTARIL) capsule 25 mg 25 mg, PO, TID MEALS Given, 25 mg at 09/14 1235    nitrofurantoin (macrocrystal-monohydrate) (MACROBID) capsule 100 mg  100 mg, PO, Q12H SCH Ordered    nystatin (NYSTOP) powder No Dose/Rate, TP, BID Given, No Dose/Rate at 09/14 1000              PRN       Medication Ordered Dose/Rate, Route, Frequency Last Action    acetaminophen (TYLENOL) tablet 650 mg 650 mg, PO, TID PRN Given, 650 mg at 09/14 0450    alum & mag hydroxide-simethicone (MAALOX PLUS) 200-200-20 mg/5 mL suspension 30 mL 30 mL, PO, Q6H PRN Ordered    benzocaine-menthol (CEPACOL/CHLORASEPTIC) lozenge 1 lozenge 1 lozenge, BU, Q1H PRN Given, 1 lozenge at 09/07 1959    diphenhydrAMINE (BENADRYL) capsule 50 mg (Or Linked Group #1) 50 mg, PO, TID PRN Ordered    diphenhydrAMINE (BENADRYL) injection 50 mg (Or  Linked Group #1) 50 mg, IM, TID PRN Ordered    docusate sodium (COLACE) capsule 100 mg 100 mg, PO, BID PRN Ordered    hydrOXYzine (VISTARIL) capsule 25 mg 25 mg, PO, Q4H PRN Ordered    ibuprofen (ADVIL) tablet 800 mg 800 mg, PO, TID PRN Given, 800 mg at 09/12 0407    loperamide (IMODIUM) capsule 2 mg 2 mg, PO, Q3H PRN Ordered    OLANZapine (ZyPREXA) tablet 10 mg 10 mg, PO, BID PRN Ordered    ondansetron (ZOFRAN) tablet 4 mg 4 mg, PO, Q8H PRN Ordered    QUEtiapine (SEROquel) tablet 50 mg 50 mg, PO, QHS PRN Ordered                    II. Examination   Vital signs reviewed:   Blood pressure 114/74, pulse 88, temperature 97.5 F (36.4 C), temperature source Oral, resp. rate 14, height 1.905 m (6\' 3" ), weight 109.1 kg (240 lb 9.6 oz), SpO2 95 %.     Mental Status Exam  (Level 1 is 1-5, Level 2 is 6-8, Level 3 is 9+)  General appearance: Appears chronological age, Good hygiene and grooming is good, no body odor, wearing rasta-colored sun glasses today  Attitude/Behavior: Calm, Cooperative and Eye Contact is  Good  Motor: No abnormalities noted  Gait: slight limp  Muscle strength and tone: Grossly intact  Speech:   Spontaneous: Yes  Rate, volume and tone are normal  No increased latency  Mood: "a little better"  Affect:  Slightly elevated  Thought Process:   Coherent:  Yes  Logical:  Yes  Associations: Goal-directed  Thought Content:  Intermittent suicidal thoughts  Perceptions:  No AH/VH  Does not seem to be responding to internal stimuli  Insight: excellent  Judgment: Fair  Cognition:   Level of Consciousness: Intact  Orientation: Intact to self, place and time  Recent Memory: Intact  Remote Memory: Intact  Attention and Concentration: Intact      Psychiatric / Cognitive Instruments: None    Pertinent Physical Exam: Not relevant to current chief complaint/reason for admission    Imaging / EKG / Labs: Labs in the last 24 hours  Results       ** No results found for the last 24 hours. **            III. Assessment and Plan (Medical Decision Making)   1. I certify that this patient requires inpatient hospitalization due to acute risk to self     2. Psychiatric Diagnoses  Bipolar disorder, mixed episode  PTSD  Medical: balanitis           3.Labs reviewed and compared to prior labs in the system. Past medical records reviewed. Coordination of care was discussed with inpatient team and as available with the outpatient team.     4. Assessment / Impression  60 yo AAM w/ history of PTSD and bipolar do admitted w/ Si in the context of recent relapse on etoh and not taking his meds.  His insight is extremely good and he is motivated to get back on meds and to seek out formal tx for his etoh use.  He has a history of multiple suicide attempts in the past.     Suicide Risk Assessment  Suicide Thoughts / Behaviors: Yes  Current Plan: Yes  Access to firearm: No  Past suicide attempts: Yes  Diagnosis of MDD, BPAD, Schizophrenia, Cluster B PD, Anorexia, Substance Use: Yes, bpad.  Emotional Trauma:  Yes  Family history of suicide: No  Demographic Risk Factors: Yes, gender, singkle, etoh use.     Plan / Recommendations:  Patient admitted to inpatient psychiatric unit on voluntary status for further diagnostic and safety evaluations, clinical stabilization with psychotropic treatment and  non-pharmacological interventions, and for discharge planning.     Biological Plan:  9/8: started lexapro 10mg  and abilify 10mg  for ptsd and bipolar   Vistaril for sleep and PRN for anxiety   9/12: increase abilify to 15mg  and add scheduled vistaril through out the day    9/14: increase abilify to 20mg     Discussed case w/ medicine, they will manage  balanitis/possible UTI    Psychosocial Plan:  Individual therapy: Psycho-education and psychotherapy of the following modalities will be provided during daily visits:Supportive and Cognitive Behavioral Therapy  Group and milieu therapies: daily per unit's schedule.  Social Work intervention will be provided for discharge planning and will include assistance with follow-up psychiatric care.       Plan for Family Involvement: will call his mother again in a few days [phone]  Other Providers Contact Information and Dates Contacted: No Updates    Total Attending time spent 35 minutes (floor time) with more than 50 percent of time in direct patient contact, coordinating care and counseling.    Signed by: Elwyn Reach, MD  02/06/2021  1:06 PM    Pt has capacity to make decisions  Next of kin:  mother, Davidson Palmieri, [phone]

## 2021-02-07 NOTE — Progress Notes (Signed)
Checklist instructions: Score the patient once a shift and as needed. Absence of a behavior results in a score of 0. Presence or increase in the display of a behavior results in a score of 1. Maximum score (SUM) possible is 6. If a behavior is at baseline for a patient, e.g. the patient is normally confused, this will result in a score of 0. An increase in confusion will result in a score of 1.      [name] is being assessed under the Broset Violence Checklist for any escalations in potentially violent or harmful behavior.     Date of Admission:    01/30/2021                                             Date of Assessment: 02/07/2021   Assessment Time 2100      Confused 0      Irritable 0      Boisterous 0      Verbal Threats 0      Physical Threats 0      Attacking Objects 0      SUM 0          TW attempted the following interventions based on the patient's sum total score assessed on the BVC: 1= Monitoring.      Should the patient require multiple interventions or fails to engage in de-escalation with staff, please review the plan for managing the care of an escalated patient.  Please do the following:  1.  Immediately notify Security and Nursing Administrative Supervisor/Director to respond  2.  Conduct team huddle to rule out clinical causes for escalated behavior  3.  Review the note authored by the Associate Professor for additional information  4.  Document specific behaviors observed  5.  Consider SAFE Team call for additional resources              The Broset Violence Checklist (BVC) is a 6 item inventory that was designed to assist in the prediction of imminent violent behavior (24 hours perspective) in healthcare and other sectors where workplace violence is acknowledged as a serious problem.   The BVC does not give instructions on what to do or what interventions to provide when a violent episode occurs; it is a took meant to aid in the risk assessment process. Interventions are based on the  individual, the environment, and cultural sensitivity indicators.

## 2021-02-07 NOTE — Plan of Care (Signed)
Problem: At Risk for Suicide AS EVIDENCED BY...  Goal: Tarry reported his goal is to " find meds that work for him maybe try ECT"  Outcome: Progressing  Flowsheets (Taken 02/07/2021 0003)  Pt will identify long and short term treatment goals based on individual needs and strengths:   Assist to identify goals for treatment based on individual needs and strengths   Assist patient to define criteria for discharge   Assist patient to sign acknowledgement of treatment plan participation     Problem: Safety  Goal: Caige will be free from infection during hospitalization  Outcome: Progressing  Flowsheets (Taken 02/07/2021 0003)  Free from Infection during hospitalization:   Encourage patient and family to use good hand hygiene technique   Assess and monitor for signs and symptoms of infection   Monitor lab/diagnostic results   Monitor all insertion sites (i.e. indwelling lines, tubes, urinary catheters, and drains)   Received the [place] on the milieu watching TV and socialized with selective peers. During the 1:1 assessment report feeling much better. Patient is calm and cooperative denied any pain or discomfort. Medication compliant and maintained safety. Eating, hydrating, and slept well. No behavioral noted on this shift. Insight and judgment were fair. No prn given so far. Will continue to monitor for safety and needs.

## 2021-02-07 NOTE — Nursing Progress Note (Signed)
Checklist instructions: Score the patient once a shift and as needed. Absence of a behavior results in a score of 0. Presence or increase in the display of a behavior results in a score of 1. Maximum score (SUM) possible is 6. If a behavior is at baseline for a patient, e.g. the patient is normally confused, this will result in a score of 0. An increase in confusion will result in a score of 1.      Selig [place] is being assessed under the Broset Violence Checklist for any escalations in potentially violent or harmful behavior.     Date of Admission:                Date of Assessment: 02/07/2021   Assessment Time 0907      Confused 0      Irritable 0      Boisterous 0      Verbal Threats 0      Physical Threats 0      Attacking Objects 0      SUM 0          TW attempted the following interventions based on the patient's sum total score assessed on the BVC: 1= Monitoring.      Should the patient require multiple interventions or fails to engage in de-escalation with staff, please review the plan for managing the care of an escalated patient.  Please do the following:  1.  Immediately notify Security and Nursing Administrative Supervisor/Director to respond  2.  Conduct team huddle to rule out clinical causes for escalated behavior  3.  Review the note authored by the Associate Professor for additional information  4.  Document specific behaviors observed  5.  Consider SAFE Team call for additional resources      The Broset Violence Checklist (BVC) is a 6 item inventory that was designed to assist in the prediction of imminent violent behavior (24 hours perspective) in healthcare and other sectors where workplace violence is acknowledged as a serious problem.   The BVC does not give instructions on what to do or what interventions to provide when a violent episode occurs; it is a took meant to aid in the risk assessment process. Interventions are based on the individual, the environment, and cultural sensitivity  indicators.

## 2021-02-07 NOTE — Plan of Care (Signed)
Problem: At Risk for Suicide AS EVIDENCED BY...  Goal: [name] will Identify and participate in at least one supportive program therapies daily  Outcome: Progressing  Note:   [name]'s Interventions: 1. Assist to target therapy groups that support individual's treatment goals 2. Assist to identify personal skill development or activity goal 3. Encourage attendance and reinforce small successes in participation     Problem: Safety  Goal: [name] will be free from infection during hospitalization  Outcome: Progressing  Note:   [name]'s interventions: 1. Assess and monitor for signs and symptoms of infection per shift 2. Monitor lab/diagnostic results daily 3. Encourage patient to use good hand hygiene technique daily     [name] was  received in the milieu watching TV with peers and interacting in a loud voice dominating the conversation. [name] was pleasant on approach, denies SI/HI/AVH, with fair insight and judgement. [name] reports that the goal for today 02/07/2021 is to  "get my medication right". [name] had PM snack request for water refill, [name] encouraged to come to staff with all needs and concerns.

## 2021-02-07 NOTE — Progress Notes (Signed)
Patient did not attend groups today.  He has been visible in the milieu throughout the day, watching tv with peers and reading the Bible.  He explained to this Clinical research associate that he likes to come to groups, but is avoiding them today because he fears being triggered by some peers.  He declined Administrator.  Will continue to encourage group attendance.

## 2021-02-07 NOTE — Plan of Care (Signed)
Problem: At Risk for Suicide AS EVIDENCED BY...  Goal: [name] reported his goal is to " find meds that work for him maybe try ECT"  Outcome: Progressing  Flowsheets (Taken 02/07/2021 1836)  Pt will identify long and short term treatment goals based on individual needs and strengths: Assist to identify goals for treatment based on individual needs and strengths  Goal: Suicide Alert Level High  Outcome: Progressing  Flowsheets (Taken 02/07/2021 1836)  Suicide Alert Level High: Level Low and Moderate Interventions  Goal: [name] will Identify and participate in at least one supportive program therapies daily  Outcome: Progressing  Flowsheets (Taken 02/07/2021 1836)  Identify and participate in supportive program therapies: Identify target number of therapies patient will attend daily as appropriate to functioning level  Goal: [name] will complete discharge safety and recovery plan within his stay on the unit  Outcome: Progressing  Flowsheets (Taken 02/07/2021 1836)  Completes discharge safety and recovery plan: Initial evaluation of discharge needs     Problem: Safety  Goal: [name] will be free from infection during hospitalization  Outcome: Progressing  Flowsheets (Taken 02/07/2021 1836)  Free from Infection during hospitalization:   Assess and monitor for signs and symptoms of infection   Encourage patient and family to use good hand hygiene technique     Problem: Discharge Barriers  Goal: [name] will be discharged home or other facility with appropriate resources  Outcome: Progressing  Flowsheets (Taken 02/07/2021 1836)  Discharge to home or other facility with appropriate resources: Provide appropriate patient education     Problem: Addiction to alcohol AS EVIDENCED BY:  Goal: [name] will  develop a discharge plan during his stay on the unit   Outcome: Progressing  Flowsheets (Taken 02/07/2021 1836)  Patient develops a discharge plan: Review healthy diversion activities that promote abstinence from  alcohol/substances     Problem: At Risk for Suicide AS EVIDENCED BY...  Goal: [name] will remain safe during hospitalization  Outcome: Completed  Flowsheets (Taken 02/07/2021 1836)  Patient will remain safe during hospitalization:   Initiate Suicide Alert Level and appropriate interventions based on SAT   Assess Suicide Risk each shift using Suicide Assessment Tool (SAT)   Assess suicide risk using Tool for Assessment of Suicide Risk (TASR) (admission, prn, discharge)     Problem: Mood Disorder  Goal: [name] will report  improved mood  Outcome: Completed  Flowsheets (Taken 02/07/2021 1836)  Reports improved mood: Frequent brief 1:1 conversations with the patient to assess mood /coping response  Goal: [name] will verbalize reduction in hallucinations during his hosptalization  Outcome: Completed  Flowsheets (Taken 02/07/2021 1836)  Verbalizes reduction in hallucinations/delusions: Monitor the presence of hallucinations/delusions every shift     [name]'s care was assumed at 0730. He denied SI/HI/AVH as well as anxiety to this Clinical research associate. He was observed continuously on the unit watching TV and interacting with his peers. Was in good spirits this shift.   He c/o pain x2 this shift and was provided with ordered tylenol 650 mg po and ibuprofen 400 mg po.   He maintains safety.

## 2021-02-07 NOTE — Progress Notes (Signed)
Psychiatry Progress Note  (Level 1 = Problem Focused, Level 2 = Expanded Problem Focused, Level 3 = Detailed)    Patient Name: [name]            Current Date/Time:  9/15/202210:13 AM  MRN:  [id number]                            Attending Physician: Elwyn Reach, MD  DOB: [birth year]                                Gender: male                              I. History   Informants: pt  A. Chief Complaint or Reason for Admission       SI  B.History of Present Illness: Interval History     (Symptoms and qualifiers:1 for Level 1, 2 for Level 2 and 3+ for Level 3)  60 yo AAM w/ bipolar do and PTSD and polysubstance abuse admitted voluntarily for SI.  He has an extensive psych history and was previously followed by an ACT team.  He has had multiple suicide attempts.  He has been off meds for 2 weeks, lost them, but says that the latuda he was taking basically stopped working anyway    Compliant with meds, tolerating abilify dose adjustment  Still feels elevated, having some racing thughts and intermittent thoughts of suicide  Says he cares so much for his family and he just keeps thinking he needs to stay alive at least until his mother dies but after that is thinking of killing himself.  She is elderly  Sleep is improving -- did not wake up until 4 am today  Anxiety is present but modestly better    Medications:   Current Medications       Scheduled       Medication Ordered Dose/Rate, Route, Frequency Last Action    acetaminophen (TYLENOL) tablet 1,000 mg 1,000 mg, PO, QHS Given, 1,000 mg at 09/14 2104    ARIPiprazole (ABILIFY) tablet 20 mg 20 mg, PO, Daily Given, 20 mg at 09/15 0853    escitalopram (LEXAPRO) tablet 10 mg 10 mg, PO, Daily Given, 10 mg at 09/15 0852    fluticasone (FLONASE) 50 MCG/ACT nasal spray 1 spray 1 spray, EACH NARE, Daily Given, 1 spray at 09/15 0853    hydrocortisone (CORTAID) 1 % cream No Dose/Rate, TP, BID Given, No Dose/Rate at 09/15 0858    hydrOXYzine (VISTARIL) capsule 100 mg  100 mg, PO, QHS Given, 100 mg at 09/14 2104    hydrOXYzine (VISTARIL) capsule 25 mg 25 mg, PO, TID MEALS Given, 25 mg at 09/15 0852    nitrofurantoin (macrocrystal-monohydrate) (MACROBID) capsule 100 mg 100 mg, PO, Q12H SCH Given, 100 mg at 09/15 0853    nystatin (NYSTOP) powder No Dose/Rate, TP, BID Given, No Dose/Rate at 09/15 0859              PRN       Medication Ordered Dose/Rate, Route, Frequency Last Action    acetaminophen (TYLENOL) tablet 650 mg 650 mg, PO, TID PRN Given, 650 mg at 09/15 0401    alum & mag hydroxide-simethicone (MAALOX PLUS) 200-200-20 mg/5 mL suspension 30 mL 30 mL, PO, Q6H PRN Ordered    benzocaine-menthol (  CEPACOL/CHLORASEPTIC) lozenge 1 lozenge 1 lozenge, BU, Q1H PRN Given, 1 lozenge at 09/07 1959    diphenhydrAMINE (BENADRYL) capsule 50 mg (Or Linked Group #1) 50 mg, PO, TID PRN Ordered    diphenhydrAMINE (BENADRYL) injection 50 mg (Or Linked Group #1) 50 mg, IM, TID PRN Ordered    docusate sodium (COLACE) capsule 100 mg 100 mg, PO, BID PRN Ordered    hydrOXYzine (VISTARIL) capsule 25 mg 25 mg, PO, Q4H PRN Ordered    ibuprofen (ADVIL) tablet 800 mg 800 mg, PO, TID PRN Given, 800 mg at 09/15 0906    loperamide (IMODIUM) capsule 2 mg 2 mg, PO, Q3H PRN Ordered    OLANZapine (ZyPREXA) tablet 10 mg 10 mg, PO, BID PRN Ordered    ondansetron (ZOFRAN) tablet 4 mg 4 mg, PO, Q8H PRN Ordered    QUEtiapine (SEROquel) tablet 50 mg 50 mg, PO, QHS PRN Ordered                    II. Examination   Vital signs reviewed:   Blood pressure 143/90, pulse 79, temperature 98.6 F (37 C), temperature source Oral, resp. rate 18, height 1.905 m (6\' 3" ), weight 109.1 kg (240 lb 9.6 oz), SpO2 94 %.     Mental Status Exam  (Level 1 is 1-5, Level 2 is 6-8, Level 3 is 9+)  General appearance: Appears chronological age, Good hygiene and grooming is good, no body odor, wearing rasta-colored sun glasses today  Attitude/Behavior: Calm, Cooperative and Eye Contact is  Good  Motor: No abnormalities noted  Gait: slight  limp  Muscle strength and tone: Grossly intact  Speech:   Spontaneous: Yes  Increased Rate,   volume and tone are normal  No increased latency  Mood: "getting there"  Affect:  Slightly elevated  Thought Process:   Coherent: Yes  Logical:  Yes  Associations: Goal-directed  Thought Content:  Intermittent suicidal thoughts  Perceptions:  No AH/VH  Does not seem to be responding to internal stimuli  Insight: excellent  Judgment: Fair  Cognition:   Level of Consciousness: Intact  Orientation: Intact to self, place and time  Recent Memory: Intact  Remote Memory: Intact  Attention and Concentration: Intact      Psychiatric / Cognitive Instruments: None    Pertinent Physical Exam: Not relevant to current chief complaint/reason for admission    Imaging / EKG / Labs: Labs in the last 24 hours  Results       ** No results found for the last 24 hours. **            III. Assessment and Plan (Medical Decision Making)   1. I certify that this patient requires inpatient hospitalization due to acute risk to self     2. Psychiatric Diagnoses  Bipolar disorder, mixed episode  PTSD  Medical: balanitis           3.Labs reviewed and compared to prior labs in the system. Past medical records reviewed. Coordination of care was discussed with inpatient team and as available with the outpatient team.     4. Assessment / Impression  61 yo AAM w/ history of PTSD and bipolar do admitted w/ Si in the context of recent relapse on etoh and not taking his meds.  His insight is extremely good and he is motivated to get back on meds and to seek out formal tx for his etoh use.  He has a history of multiple suicide attempts in the past.  Suicide Risk Assessment  Suicide Thoughts / Behaviors: intermittent  Current Plan: Yes  Access to firearm: No  Past suicide attempts: Yes  Diagnosis of MDD, BPAD, Schizophrenia, Cluster B PD, Anorexia, Substance Use: Yes, bpad.  Emotional Trauma: Yes  Family history of suicide: No  Demographic Risk Factors: Yes,  gender, singkle, etoh use.     Plan / Recommendations:  Patient admitted to inpatient psychiatric unit on voluntary status for further diagnostic and safety evaluations, clinical stabilization with psychotropic treatment and non-pharmacological interventions, and for discharge planning.     Biological Plan:  9/8: started lexapro 10mg  and abilify 10mg  for ptsd and bipolar   Vistaril for sleep and PRN for anxiety   9/12: increase abilify to 15mg  and add scheduled vistaril through out the day    9/14: increased abilify to 20mg     [facility] when [facility] is more stable; concerned he will go back to drinking/drugs if he goes home straight from the unit      Psychosocial Plan:  Individual therapy: Psycho-education and psychotherapy of the following modalities will be provided during daily visits:Supportive and Cognitive Behavioral Therapy  Group and milieu therapies: daily per unit's schedule.  Social Work intervention will be provided for discharge planning and will include assistance with follow-up psychiatric care.       Plan for Family Involvement: will call his mother again [phone]  Other Providers Contact Information and Dates Contacted: No Updates    Total Attending time spent 35 minutes (floor time) with more than 50 percent of time in direct patient contact, coordinating care and counseling.    Signed by: Elwyn Reach, MD  02/07/2021  10:13 AM    Pt has capacity to make decisions  Next of kin:  mother, Amere Bricco, [phone]

## 2021-02-08 LAB — COVID-19 (SARS-COV-2): SARS CoV 2 Overall Result: DETECTED — AB

## 2021-02-08 MED ORDER — HYDROXYZINE PAMOATE 25 MG PO CAPS
25.0000 mg | ORAL_CAPSULE | Freq: Three times a day (TID) | ORAL | Status: DC
Start: 2021-02-09 — End: 2021-02-13

## 2021-02-08 MED ORDER — HYDROXYZINE PAMOATE 100 MG PO CAPS
100.0000 mg | ORAL_CAPSULE | Freq: Every evening | ORAL | Status: DC
Start: 2021-02-08 — End: 2021-02-13

## 2021-02-08 MED ORDER — ARIPIPRAZOLE 20 MG PO TABS
20.0000 mg | ORAL_TABLET | Freq: Every day | ORAL | Status: AC
Start: 2021-02-09 — End: ?

## 2021-02-08 MED ORDER — ESCITALOPRAM OXALATE 10 MG PO TABS
10.0000 mg | ORAL_TABLET | Freq: Every day | ORAL | Status: DC
Start: 2021-02-09 — End: 2021-02-13

## 2021-02-08 MED ORDER — ACETAMINOPHEN 500 MG PO TABS
1000.0000 mg | ORAL_TABLET | Freq: Every evening | ORAL | Status: DC
Start: 2021-02-08 — End: 2022-06-11

## 2021-02-08 MED ORDER — FLUTICASONE PROPIONATE 50 MCG/ACT NA SUSP
1.0000 | Freq: Every day | NASAL | Status: DC
Start: 2021-02-09 — End: 2022-06-11

## 2021-02-08 NOTE — Nursing Progress Note (Signed)
Checklist instructions: Score the patient once a shift and as needed. Absence of a behavior results in a score of 0. Presence or increase in the display of a behavior results in a score of 1. Maximum score (SUM) possible is 6. If a behavior is at baseline for a patient, e.g. the patient is normally confused, this will result in a score of 0. An increase in confusion will result in a score of 1.      [name] is being assessed under the Broset Violence Checklist for any escalations in potentially violent or harmful behavior.     Date of Admission:                     Date of Assessment: 02/08/2021   Assessment Time 0906      Confused 0      Irritable 0      Boisterous 0      Verbal Threats 0      Physical Threats 0      Attacking Objects 0      SUM 0          TW attempted the following interventions based on the patient's sum total score assessed on the BVC: 1= Monitoring.      Should the patient require multiple interventions or fails to engage in de-escalation with staff, please review the plan for managing the care of an escalated patient.  Please do the following:  1.  Immediately notify Security and Nursing Administrative Supervisor/Director to respond  2.  Conduct team huddle to rule out clinical causes for escalated behavior  3.  Review the note authored by the Associate Professor for additional information  4.  Document specific behaviors observed  5.  Consider SAFE Team call for additional resources      The Broset Violence Checklist (BVC) is a 6 item inventory that was designed to assist in the prediction of imminent violent behavior (24 hours perspective) in healthcare and other sectors where workplace violence is acknowledged as a serious problem.   The BVC does not give instructions on what to do or what interventions to provide when a violent episode occurs; it is a took meant to aid in the risk assessment process. Interventions are based on the individual, the environment, and cultural  sensitivity indicators.

## 2021-02-08 NOTE — Progress Notes (Signed)
[name] MRN: [id number]  60 y.o.  male    NUTRITION:  Reason for assessment: Length of stay  100% po intake    Assessment   Past Medical History:   Diagnosis Date    Arthritis     Bipolar disorder     PTSD (post-traumatic stress disorder)      Wt Readings from Last 30 Encounters:   02/05/21 109.1 kg (240 lb 9.6 oz)   01/28/21 104.3 kg (230 lb)   12/17/19 108.1 kg (238 lb 4.8 oz)   03/15/19 113.1 kg (249 lb 4.8 oz)   03/11/19 104.5 kg (230 lb 6.1 oz)   01/05/18 109 kg (240 lb 4.8 oz)   12/29/17 104.3 kg (230 lb)     Social History     Socioeconomic History    Marital status: Single   Tobacco Use    Smoking status: Never    Smokeless tobacco: Never    Tobacco comments:     Pt denies tobacco use in the last 30 days   Vaping Use    Vaping Use: Never used   Substance and Sexual Activity    Alcohol use: Yes     Alcohol/week: 14.0 standard drinks     Types: 6 Glasses of wine, 6 Cans of beer, 2 Shots of liquor per week     Comment: patient admitted used alcohol with inthe past 12 month    Drug use: Never     Comment: patient used  drug use within tha past 12 months    Sexual activity: Not Currently       Active Hospital Problems    Diagnosis    Mood disorder     Allergies   Allergen Reactions    Invega [Paliperidone]     Lamictal [Lamotrigine]     Penicillins      GI Symptoms: None  Skin: No wound documentation  NFPE: Not able to secondary to medical condition    Orders Placed This Encounter   Procedures    Diet regular     Current Meds:  acetaminophen, 1,000 mg, QHS  ARIPiprazole, 20 mg, Daily  escitalopram, 10 mg, Daily  fluticasone, 1 spray, Daily  hydrocortisone, , BID  hydrOXYzine, 100 mg, QHS  hydrOXYzine, 25 mg, TID MEALS  nystatin, , BID        Recent Labs:        Invalid input(s): ADIFF, REFLX, CANCL, BAND, ABAND            Invalid input(s):  PREALB  No intake or output data in the 24 hours ending 02/08/21 1346      Anthropometrics  Height: 190.5 cm (6\' 3" )  Weight: 109.1 kg (240 lb 9.6 oz)  Weight  Change: 0  IBW/kg (Calculated) Male: 89.13 kg  IBW/kg (Calculated) Male: 79.51 kg  BMI (calculated): 28.8    Estimated Nutrition Needs:  Estimated Energy Needs  Total Energy Estimated Needs: 2400-2900 cal  Method for Estimating Needs: IBW x (25-30) cal    Estimated Protein Needs  Total Protein Estimated Needs: 98-118g  Method for Estimating Needs: 50% of caloric needs         Fluid Needs  Total Fluid Estimated Needs: 2940 ml  Method for Estimating Needs: IBW x 30 ml      Learning & Discharge Planning Needs: None  Religious/Cultural Food Practices:None    Nutrition Diagnosis:   No nutrition diagnosis    Intervention:  Continue nutrition plan    Goals:  Pt maintains  7%% or more  po intake during LOS    M/E:  Monitor: po intake and labs  Follow up 02/18/21    [name] [id number]   02/08/21 @ 1350

## 2021-02-08 NOTE — Progress Notes (Signed)
SW spoke with Lurena Joiner at [facility] and was informed that patient could be considered for review despite having [place] [employer] due to patient being homeless and residing in [place].     SW met with patient and discussed patient's discharge plan. Patient reported he plans to follow up with [facility]. SW and patient completed the referral packet and patient signed. SW faxed referral packet to [facility].

## 2021-02-08 NOTE — Progress Notes (Addendum)
Checklist instructions: Score the patient once a shift and as needed. Absence of a behavior results in a score of 0. Presence or increase in the display of a behavior results in a score of 1. Maximum score (SUM) possible is 6. If a behavior is at baseline for a patient, e.g. the patient is normally confused, this will result in a score of 0. An increase in confusion will result in a score of 1.      [name] is being assessed under the Broset Violence Checklist for any escalations in potentially violent or harmful behavior.     Date of Admission:                                                                         Date of Assessment:    Assessment Time 2000      Confused 0      Irritable 1      Boisterous 0      Verbal Threats 0      Physical Threats 0      Attacking Objects 0      SUM 1          TW attempted the following interventions based on the patient's sum total score assessed on the BVC: 1= Monitoring.      Should the patient require multiple interventions or fails to engage in de-escalation with staff, please review the plan for managing the care of an escalated patient.  Please do the following:  1.  Immediately notify Security and Nursing Administrative Supervisor/Director to respond  2.  Conduct team huddle to rule out clinical causes for escalated behavior  3.  Review the note authored by the Associate Professor for additional information  4.  Document specific behaviors observed  5.  Consider SAFE Team call for additional resources              The Broset Violence Checklist (BVC) is a 6 item inventory that was designed to assist in the prediction of imminent violent behavior (24 hours perspective) in healthcare and other sectors where workplace violence is acknowledged as a serious problem.   The BVC does not give instructions on what to do or what interventions to provide when a violent episode occurs; it is a took meant to aid in the risk assessment process. Interventions are based on  the individual, the environment, and cultural sensitivity indicators.

## 2021-02-08 NOTE — Plan of Care (Signed)
Problem: At Risk for Suicide AS EVIDENCED BY...  Goal: Curlee reported his goal is to " find meds that work for him maybe try ECT"  Outcome: Progressing  Flowsheets (Taken 02/07/2021 1836)  Pt will identify long and short term treatment goals based on individual needs and strengths: Assist to identify goals for treatment based on individual needs and strengths  Goal: Suicide Alert Level High  Outcome: Progressing  Flowsheets (Taken 02/07/2021 1836)  Suicide Alert Level High: Level Low and Moderate Interventions  Goal: Elbie will Identify and participate in at least one supportive program therapies daily  Outcome: Progressing  Flowsheets (Taken 02/07/2021 1836)  Identify and participate in supportive program therapies: Identify target number of therapies patient will attend daily as appropriate to functioning level  Goal: Davyn will complete discharge safety and recovery plan within his stay on the unit  Outcome: Progressing  Flowsheets (Taken 02/07/2021 1836)  Completes discharge safety and recovery plan: Initial evaluation of discharge needs     Problem: Safety  Goal: Alex will be free from infection during hospitalization  Outcome: Progressing  Flowsheets (Taken 02/07/2021 1836)  Free from Infection during hospitalization:   Assess and monitor for signs and symptoms of infection   Encourage patient and family to use good hand hygiene technique     Problem: Discharge Barriers  Goal: Jaelon will be discharged home or other facility with appropriate resources  Outcome: Progressing  Flowsheets (Taken 02/08/2021 1628)  Discharge to home or other facility with appropriate resources: Provide appropriate patient education     Problem: Addiction to alcohol AS EVIDENCED BY:  Goal: Martese will  develop a discharge plan during his stay on the unit   Outcome: Progressing  Flowsheets (Taken 02/07/2021 1836)  Patient develops a discharge plan: Review healthy diversion activities that promote abstinence from  alcohol/substances     Adrien's care was assumed at 0730. He denied current SI/HI/AVH. He endorsed physical generalized pain this shift and reported "its like arthritis acting up", prn tylenol 650 mgp po and ibuprofen 400 mg po were given this shift. He reported "they work great for me."  He is observed watching TV and reading bible scriptures that he annotates.   No boisterous or disruptive behaviors noted this shift.   He was agreeable to scheduled medications and maintained safety.     COVID test ordered and specimen collected by this writer. Result is positive this shift. Pt informed. Precautions for isolation placed in orders.

## 2021-02-08 NOTE — Treatment Plan (Signed)
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    Rael is a 60 year old male on Voluntary status for suicidal ideations with a plan to use a knife on himself.    1. Summary of Patient Progress on Treatment Plan Goals:  [name] was seen by the treatment team this shift. He updated team that he is "feeling a lot better. 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 Case management will follow up with this request. No other concerns voiced at this time. No discharge date planned at this time.     2. Level of Patient Involvement:  Actively engaged/contributing    3. Patient Understanding of Plan of Care:  Able to verbalize goals and interventions, Able to verbalize discharge criteria, Concrete understanding of primary goal/interventions    4. Level of Agreement/Commitment to Plan of Care:  Agrees with and is committed to plan of care          Contributor Signatures:      MD_________________________________ Date___________________    SW_________________________________Date ___________________    RN ____Anyssa  Morales_____________Date__9/16/2022___________    Patient_______________________________Date____________________    Other________________________________Date ___________________    Other________________________________Date ___________________    (This document is signed electronically by Clinical research associate and electronic co-signer.  Other participants sign a printed copy which is scanned into the EMR)

## 2021-02-08 NOTE — Progress Notes (Signed)
Psychiatry Progress Note  (Level 1 = Problem Focused, Level 2 = Expanded Problem Focused, Level 3 = Detailed)    Patient Name: [name]            Current Date/Time:  9/16/202210:28 AM  MRN:  [id number]                            Attending Physician: Elwyn Reach, MD  DOB: 1961-01-09                                Gender: male                              I. History   Informants: pt  A. Chief Complaint or Reason for Admission       SI  B.History of Present Illness: Interval History     (Symptoms and qualifiers:1 for Level 1, 2 for Level 2 and 3+ for Level 3)  60 yo AAM w/ bipolar do and PTSD and polysubstance abuse admitted voluntarily for SI.  He has an extensive psych history and was previously followed by an ACT team.  He has had multiple suicide attempts.  He has been off meds for 2 weeks, lost them, but says that the latuda he was taking basically stopped working anyway    Compliant with meds  Says his sleep continues to get a little better each night  Did not wake up until 5am  He continues to have intermittent thoughts of self harm and suicide but can feel his mood lifting, feels like he is starting to respond to medication  He expressed appreciation of the help he is getting here  Has been exposed to covid, but denies fever, headache, sore throat, cough  Medications:   Current Medications       Scheduled       Medication Ordered Dose/Rate, Route, Frequency Last Action    acetaminophen (TYLENOL) tablet 1,000 mg 1,000 mg, PO, QHS Given, 1,000 mg at 09/15 2204    ARIPiprazole (ABILIFY) tablet 20 mg 20 mg, PO, Daily Given, 20 mg at 09/16 0847    escitalopram (LEXAPRO) tablet 10 mg 10 mg, PO, Daily Given, 10 mg at 09/16 0847    fluticasone (FLONASE) 50 MCG/ACT nasal spray 1 spray 1 spray, EACH NARE, Daily Given, 1 spray at 09/15 0853    hydrocortisone (CORTAID) 1 % cream No Dose/Rate, TP, BID Given, No Dose/Rate at 09/16 0847    hydrOXYzine (VISTARIL) capsule 100 mg 100 mg, PO, QHS Given, 100 mg at  09/15 2202    hydrOXYzine (VISTARIL) capsule 25 mg 25 mg, PO, TID MEALS Given, 25 mg at 09/16 0847    nitrofurantoin (macrocrystal-monohydrate) (MACROBID) capsule 100 mg 100 mg, PO, Q12H SCH Given, 100 mg at 09/16 0846    nystatin (NYSTOP) powder No Dose/Rate, TP, BID Given, No Dose/Rate at 09/16 0847              PRN       Medication Ordered Dose/Rate, Route, Frequency Last Action    acetaminophen (TYLENOL) tablet 650 mg 650 mg, PO, TID PRN Given, 650 mg at 09/16 0305    alum & mag hydroxide-simethicone (MAALOX PLUS) 200-200-20 mg/5 mL suspension 30 mL 30 mL, PO, Q6H PRN Ordered    benzocaine-menthol (CEPACOL/CHLORASEPTIC) lozenge 1 lozenge 1 lozenge,  BU, Q1H PRN Given, 1 lozenge at 09/07 1959    diphenhydrAMINE (BENADRYL) capsule 50 mg (Or Linked Group #1) 50 mg, PO, TID PRN Ordered    diphenhydrAMINE (BENADRYL) injection 50 mg (Or Linked Group #1) 50 mg, IM, TID PRN Ordered    docusate sodium (COLACE) capsule 100 mg 100 mg, PO, BID PRN Ordered    hydrOXYzine (VISTARIL) capsule 25 mg 25 mg, PO, Q4H PRN Ordered    ibuprofen (ADVIL) tablet 800 mg 800 mg, PO, TID PRN Given, 800 mg at 09/16 0853    loperamide (IMODIUM) capsule 2 mg 2 mg, PO, Q3H PRN Ordered    OLANZapine (ZyPREXA) tablet 10 mg 10 mg, PO, BID PRN Ordered    ondansetron (ZOFRAN) tablet 4 mg 4 mg, PO, Q8H PRN Ordered    QUEtiapine (SEROquel) tablet 50 mg 50 mg, PO, QHS PRN Ordered                    II. Examination   Vital signs reviewed:   Blood pressure 146/71, pulse 92, temperature 98.5 F (36.9 C), temperature source Oral, resp. rate 14, height 1.905 m (6\' 3" ), weight 109.1 kg (240 lb 9.6 oz), SpO2 96 %.     Mental Status Exam  (Level 1 is 1-5, Level 2 is 6-8, Level 3 is 9+)  General appearance: Appears chronological age, Good hygiene and grooming is good, no body odor, wearing rasta-colored sun glasses today  Attitude/Behavior: Calm, Cooperative and Eye Contact is  Good  Motor: No abnormalities noted  Gait: slight limp  Muscle strength and tone:  Grossly intact  Speech:   Spontaneous: Yes  Increased Rate,   volume and tone are normal  No increased latency  Mood: "a little better"  Affect:  Slightly elevated but less so than yesterday, full affect  Thought Process:   Coherent: Yes  Logical:  Yes  Associations: Goal-directed  Thought Content:  Intermittent suicidal thoughts  Perceptions:  No AH/VH  Does not seem to be responding to internal stimuli  Insight: excellent  Judgment: Fair  Cognition:   Level of Consciousness: Intact  Orientation: Intact to self, place and time  Recent Memory: Intact  Remote Memory: Intact  Attention and Concentration: Intact      Psychiatric / Cognitive Instruments: None    Pertinent Physical Exam: Not relevant to current chief complaint/reason for admission    Imaging / EKG / Labs: Labs in the last 24 hours  Results       ** No results found for the last 24 hours. **            III. Assessment and Plan (Medical Decision Making)   1. I certify that this patient requires inpatient hospitalization due to acute risk to self     2. Psychiatric Diagnoses  Bipolar disorder, mixed episode  PTSD  Medical: balanitis           3.Labs reviewed and compared to prior labs in the system. Past medical records reviewed. Coordination of care was discussed with inpatient team and as available with the outpatient team.     4. Assessment / Impression  60 yo AAM w/ history of PTSD and bipolar do admitted w/ Si in the context of recent relapse on etoh and not taking his meds.  His insight is extremely good and he is motivated to get back on meds and to seek out formal tx for his etoh use.  He has a history of multiple suicide attempts in  the past.     Suicide Risk Assessment  Suicide Thoughts / Behaviors: intermittent  Current Plan: Yes  Access to firearm: No  Past suicide attempts: Yes  Diagnosis of MDD, BPAD, Schizophrenia, Cluster B PD, Anorexia, Substance Use: Yes, bpad.  Emotional Trauma: Yes  Family history of suicide: No  Demographic Risk  Factors: Yes, gender, singkle, etoh use.     Plan / Recommendations:  Patient admitted to inpatient psychiatric unit on voluntary status for further diagnostic and safety evaluations, clinical stabilization with psychotropic treatment and non-pharmacological interventions, and for discharge planning.     Biological Plan:  9/8: started lexapro 10mg  and abilify 10mg  for ptsd and bipolar   Vistaril for sleep and PRN for anxiety   9/12: increase abilify to 15mg  and add scheduled vistaril through out the day    9/14: increased abilify to 20mg     Pt thinking about stepping down to [facility] when he is more stable; concerned he will go back to drinking/drugs if he goes home straight from the unit.  Still having thoughts of suicide and cutting.  Anticipate referral to [facility] at the beginning of next week.      Psychosocial Plan:  Individual therapy: Psycho-education and psychotherapy of the following modalities will be provided during daily visits:Supportive and Cognitive Behavioral Therapy  Group and milieu therapies: daily per unit's schedule.  Social Work intervention will be provided for discharge planning and will include assistance with follow-up psychiatric care.       Plan for Family Involvement: left a VM for his mother 9/16  Other Providers Contact Information and Dates Contacted: No Updates    Total Attending time spent 35 minutes (floor time) with more than 50 percent of time in direct patient contact, coordinating care and counseling.    Signed by: Elwyn Reach, MD  02/08/2021  10:28 AM    Pt has capacity to make decisions  Next of kin:  mother, Annie Roseboom, [phone], [phone]

## 2021-02-09 ENCOUNTER — Inpatient Hospital Stay
Admission: AD | Admit: 2021-02-09 | Discharge: 2021-02-13 | DRG: 753 | Disposition: A | Discharge status: Home / Self Care | Payer: No Typology Code available for payment source | Source: Other Acute Inpatient Hospital | Attending: Psychiatry | Admitting: Psychiatry

## 2021-02-09 DIAGNOSIS — F3160 Bipolar disorder, current episode mixed, unspecified: Principal | ICD-10-CM | POA: Diagnosis present

## 2021-02-09 DIAGNOSIS — Z79899 Other long term (current) drug therapy: Secondary | ICD-10-CM

## 2021-02-09 DIAGNOSIS — U071 COVID-19: Secondary | ICD-10-CM | POA: Diagnosis present

## 2021-02-09 DIAGNOSIS — M1990 Unspecified osteoarthritis, unspecified site: Secondary | ICD-10-CM | POA: Diagnosis present

## 2021-02-09 DIAGNOSIS — F4310 Post-traumatic stress disorder, unspecified: Secondary | ICD-10-CM | POA: Diagnosis present

## 2021-02-09 DIAGNOSIS — R45851 Suicidal ideations: Secondary | ICD-10-CM | POA: Diagnosis present

## 2021-02-09 DIAGNOSIS — F319 Bipolar disorder, unspecified: Secondary | ICD-10-CM | POA: Diagnosis present

## 2021-02-09 DIAGNOSIS — N481 Balanitis: Secondary | ICD-10-CM | POA: Diagnosis present

## 2021-02-09 MED ORDER — HYDROXYZINE PAMOATE 25 MG PO CAPS
25.0000 mg | ORAL_CAPSULE | ORAL | Status: DC | PRN
Start: 2021-02-09 — End: 2021-02-13

## 2021-02-09 MED ORDER — FLUTICASONE PROPIONATE 50 MCG/ACT NA SUSP
1.0000 | Freq: Every day | NASAL | Status: DC
Start: 2021-02-10 — End: 2021-02-13
  Administered 2021-02-10 – 2021-02-11 (×2): 1 via NASAL
  Filled 2021-02-09: qty 16

## 2021-02-09 MED ORDER — TRAZODONE HCL 50 MG PO TABS
50.0000 mg | ORAL_TABLET | Freq: Every evening | ORAL | Status: DC | PRN
Start: 2021-02-09 — End: 2021-02-13

## 2021-02-09 MED ORDER — NYSTATIN 100000 UNIT/GM EX POWD
Freq: Two times a day (BID) | CUTANEOUS | Status: DC
Start: 2021-02-10 — End: 2021-02-13
  Filled 2021-02-09: qty 15

## 2021-02-09 MED ORDER — ARIPIPRAZOLE 10 MG PO TABS
20.0000 mg | ORAL_TABLET | Freq: Every day | ORAL | Status: DC
Start: 2021-02-10 — End: 2021-02-10
  Filled 2021-02-09: qty 2

## 2021-02-09 MED ORDER — IBUPROFEN 400 MG PO TABS
800.0000 mg | ORAL_TABLET | Freq: Three times a day (TID) | ORAL | Status: DC | PRN
Start: 2021-02-09 — End: 2021-02-13
  Administered 2021-02-10 – 2021-02-11 (×2): 800 mg via ORAL
  Filled 2021-02-09 (×2): qty 2

## 2021-02-09 MED ORDER — OLANZAPINE 10 MG PO TABS
10.0000 mg | ORAL_TABLET | Freq: Two times a day (BID) | ORAL | Status: DC | PRN
Start: 2021-02-09 — End: 2021-02-13

## 2021-02-09 MED ORDER — DOCUSATE SODIUM 100 MG PO CAPS
100.0000 mg | ORAL_CAPSULE | Freq: Two times a day (BID) | ORAL | Status: DC | PRN
Start: 2021-02-09 — End: 2021-02-13

## 2021-02-09 MED ORDER — HYDROXYZINE PAMOATE 25 MG PO CAPS
25.0000 mg | ORAL_CAPSULE | Freq: Three times a day (TID) | ORAL | Status: DC
Start: 2021-02-10 — End: 2021-02-10
  Administered 2021-02-10 (×2): 25 mg via ORAL
  Filled 2021-02-09 (×2): qty 1

## 2021-02-09 MED ORDER — ALUM & MAG HYDROXIDE-SIMETH 200-200-20 MG/5ML PO SUSP
15.0000 mL | Freq: Four times a day (QID) | ORAL | Status: DC | PRN
Start: 2021-02-09 — End: 2021-02-13

## 2021-02-09 MED ORDER — ONDANSETRON HCL 4 MG PO TABS
4.0000 mg | ORAL_TABLET | Freq: Three times a day (TID) | ORAL | Status: DC | PRN
Start: 2021-02-09 — End: 2021-02-13

## 2021-02-09 MED ORDER — LOPERAMIDE HCL 2 MG PO CAPS
2.0000 mg | ORAL_CAPSULE | ORAL | Status: DC | PRN
Start: 2021-02-09 — End: 2021-02-13

## 2021-02-09 MED ORDER — ACETAMINOPHEN 325 MG PO TABS
650.0000 mg | ORAL_TABLET | Freq: Four times a day (QID) | ORAL | Status: DC | PRN
Start: 2021-02-09 — End: 2021-02-13
  Administered 2021-02-10 – 2021-02-13 (×6): 650 mg via ORAL
  Filled 2021-02-09 (×6): qty 2

## 2021-02-09 MED ORDER — HYDROCORTISONE 1 % EX CREA
TOPICAL_CREAM | Freq: Two times a day (BID) | CUTANEOUS | Status: DC
Start: 2021-02-10 — End: 2021-02-13
  Filled 2021-02-09: qty 28.4

## 2021-02-09 MED ORDER — ESCITALOPRAM OXALATE 10 MG PO TABS
10.0000 mg | ORAL_TABLET | Freq: Every day | ORAL | Status: DC
Start: 2021-02-10 — End: 2021-02-10
  Filled 2021-02-09: qty 1

## 2021-02-09 MED ORDER — BENZOCAINE-MENTHOL MT LOZG (WRAP)
1.0000 | LOZENGE | OROMUCOSAL | Status: DC | PRN
Start: 2021-02-09 — End: 2021-02-13
  Administered 2021-02-10 (×2): 1 via BUCCAL
  Filled 2021-02-09 (×2): qty 1

## 2021-02-09 MED ORDER — ACETAMINOPHEN 500 MG PO TABS
1000.0000 mg | ORAL_TABLET | Freq: Every evening | ORAL | Status: DC
Start: 2021-02-09 — End: 2021-02-13
  Administered 2021-02-09 – 2021-02-12 (×4): 1000 mg via ORAL
  Filled 2021-02-09 (×4): qty 2

## 2021-02-09 MED ORDER — HYDROXYZINE PAMOATE 25 MG PO CAPS
100.0000 mg | ORAL_CAPSULE | Freq: Every evening | ORAL | Status: DC
Start: 2021-02-09 — End: 2021-02-13
  Administered 2021-02-09 – 2021-02-12 (×4): 100 mg via ORAL
  Filled 2021-02-09 (×4): qty 4

## 2021-02-09 NOTE — Discharge Instr - AVS First Page (Addendum)
[facility] Continuing Care Plan, including the After Visit Summary (AVS), the Psychiatrist's Discharge Summary, and all 12 elements were made available  routed electronically or can be viewed by [facility] providers who have access to Epic) on 02/09/2021 via EPIC; [facility] providers have EPIC access at all times.       RECOMMENDED THAT YOU COMPLY WITH THE FOLLOWING PLAN:   Follow up appointment with: [facility]    Date: 02/09/21 Time: 3 PM  Location: [address], [place]        FOR EMERGENCY MENTAL HEALTH, CONTACT:    You can use the following numbers for emergencies:  For Psychiatric Emergencies: Call 911 or [facility] at [phone]  For Medical Emergencies: Call 911  National Suicide Prevention Lifeline: Call 1-800-273-TALK 5860150825) or text a message to [phone]      The following were reviewed with patient/family by Drusilla Kanner, RN.    Reason for IP admission:   Diagnosis: SI     Precipitating event: SI    Major procedures and tests, including summary of results: n/a  Diagnosis at discharge: Bipolar Disorder, PTSD.  Current medication list: See AVS  Were studies pending at discharge?     No.     Patient instructions:  See Adult Wellness, Recovery, and Safety Plan: yes  Emergency contact information: Rayford Halsted [phone], [phone]  or [phone]   Plan for follow up care: See AVS :   Name of provider(s), appointment(s), and location(s) of follow up care: See AVS  [facility]                                            Advance Care Plan  Patient had a medical and mental health Advance Directive at admission.                                                                            No    If patient did not have a medical and mental health Advance Directive at admission:  information about completing Advance Directives or designating a surrogate decision maker, and a form, was provided.  After receiving information- Did patient create a medical and mental health Advance Directive or  appoint a surrogate decision maker?                                                                                                       No  If No:   If no, what is the reason?   ( Check reason)                 _x_  Prefer to wait until I feel better  _x_  Prefer to discuss with family or significant other        ___ Prefer to discuss with continuing care provider        _x_  Do not believe I will need a mental health Advance Directive        ___ Other_________________________________________

## 2021-02-09 NOTE — Progress Notes (Signed)
Attempted to call report to [facility] 6th floor - Adult Psychiatry - RN stated she would not accept report until; she received the signed consent form - Consent form sent this morning to FAX number provided [phone], Piedad Climes RN stated FAX number incorrect, given: [phone], consent again faxed, receipt of successful transfer received x 2.  Again this RN stated transport was scheduled for 1400; [place] Staff terminated phonecall.

## 2021-02-09 NOTE — Progress Notes (Signed)
1258: This RN attempted to call report to RN for [facility] room 618 - RN Megan Salon (?) verbalized that she would ask the RN receiving the patient to call this RN on [phone].  This RN notified Keenan Bachelor that patient's transport was scheduled for pick up at [facility] at 1400.

## 2021-02-09 NOTE — Nursing Progress Note (Signed)
Pt arrived on the unit at 1845. A&O X4 pt was waned and brought to the room immediately because pt insisted he had "had the bathroom immediately, I don't not want to have an accident on myself" 2 RN skin check completed skin appears dry and ashy but intact. Pt refused to hand over his shirt and black soak and 2 boxer brief, and a pair of black  alethic shoes without lace " I have arthritis, I get too cold easily aint no way in hell am giving you this. I don't even want to be here. I was suppose to be leaving next week. How in the well are you going to go to the hapoital and get COVID there." Pt ranted. Pt noted irritable, angry, belligerent, and entitled. Unit policy regarding COVID/isolation explained. Pt oriented to room, remote control for TV and dinner  provided.

## 2021-02-09 NOTE — Progress Notes (Signed)
This RN attempted to call report for the 3rd time to [facility] Floor Adult Psychiatry - Call answered, by RN Dyllis, this RN confirmed that the consent form had been sent twice to 2 different fax numbers provided by [facility]. RN Dyllis verbalized she will be receiving the patient and needs to go and locate the faxed consent, she will call this RN on [phone] to receive report.

## 2021-02-09 NOTE — Plan of Care (Signed)
Problem: Airborne & Contact Special Isolation  Goal: Prevent transmission of other diagnosed infection while caring for patient in Airborne-Contact Special isolation  Outcome: Not Progressing     Problem: At Risk for Suicide AS EVIDENCED BY...  Goal: [name] reported his goal is to " find meds that work for him maybe try ECT"  Outcome: Progressing     Problem: Safety  Goal: [name] will be free from infection during hospitalization  Outcome: Progressing   Patient was met sitting in the hall way, denied SIHIAVH pain and depression.  He was met siting at a far distance watching TV. Intermittently compliant with wearing of mask stated "I came in negative, [place] gave me COVID, I have PTSD, I cannot stay in my room, if they want they can discharge me". He later retreated to his room and slept. Med complaint. Encouraged to keep his mask on at all times.

## 2021-02-09 NOTE — Progress Notes (Signed)
Checklist instructions: Score the patient once a shift and as needed. Absence of a behavior results in a score of 0. Presence or increase in the display of a behavior results in a score of 1. Maximum score (SUM) possible is 6. If a behavior is at baseline for a patient, e.g. the patient is normally confused, this will result in a score of 0. An increase in confusion will result in a score of 1.      [name] is being assessed under the Broset Violence Checklist for any escalations in potentially violent or harmful behavior.     Date of Admission:                                                                         Date of Assessment:    Assessment Time 0900      Confused 0      Irritable 0      Boisterous 0      Verbal Threats 0      Physical Threats 0      Attacking Objects 0      SUM 0          TW attempted the following interventions based on the patient's sum total score assessed on the BVC:   0  Should the patient require multiple interventions or fails to engage in de-escalation with staff, please review the plan for managing the care of an escalated patient.  Please do the following:  1.  Immediately notify Security and Nursing Administrative Supervisor/Director to respond  2.  Conduct team huddle to rule out clinical causes for escalated behavior  3.  Review the note authored by the Associate Professor for additional information  4.  Document specific behaviors observed  5.  Consider SAFE Team call for additional resources    The Broset Violence Checklist (BVC) is a 6 item inventory that was designed to assist in the prediction of imminent violent behavior (24 hours perspective) in healthcare and other sectors where workplace violence is acknowledged as a serious problem.   The BVC does not give instructions on what to do or what interventions to provide when a violent episode occurs; it is a took meant to aid in the risk assessment process. Interventions are based on the individual, the  environment, and cultural sensitivity indicators.

## 2021-02-09 NOTE — Progress Notes (Signed)
This RN gave report to RN Dyllis at [facility], 6th floor, Adult Psychiatry using SBAR. RN notified of patient's covid status resulting positive 02/08/2021. Apart from a headache patient has been asymptomatic to date - no coughing, no dyspnea, no shortness of breath. RN notified patient intermittently perseverating, angry, hyper-verbal regarding contracting covid while hospitalized as he was negative when he was admitted on 01/30/2021. Back ground history and presenting problem, compliance with medication and mask wearing provided. Both RN's discussed patient orders that were discontinued for oral antibiotics for medical balanitis including hydrocortisone cream. Hydrocortisone cream, Flonase sent with patient.  Patient did not receive his evening meal before transport arrived to collect him as the meal trays had not arrived. Patient observed to be wearing a mask when he left 3A with EMS transport.

## 2021-02-09 NOTE — Plan of Care (Signed)
Problem: Airborne & Contact Special Isolation  Goal: Prevent transmission of other diagnosed infection while caring for patient in Airborne-Contact Special isolation  Outcome: Progressing  Male 74  Patient Covid Positive - perseverating about receiving covid while in hospital. At times raises his vice, becoming hyperverbal about this. Patient's frustration validated. Encouraged to wear mask when out of his room. Patient requires reinforcement to wear his mask when outside of his room at times. Patient consented to being transferred to [facility] where covid +  are being transferred from this facility.  Patient able to make needs known, attending to ADL's independently. Appetite good, eating and drinking well.  Compliant with ordered medication. Mood - irritable (situational - related to contracting covid while hospitalized), Affect - intermittantly angry - situational related to contracting covid while hospitalized.  A&O x 4. Requests medication when required for generalized body aches related to chronic arthritis.  Denies SI/HI/AVH/SHB.

## 2021-02-09 NOTE — Progress Notes (Signed)
Patient requested and received tylenol 650 mg for headache, Cepacol for sore throat at 1407.

## 2021-02-09 NOTE — Progress Notes (Addendum)
Discharge  02/09/21: Discharge order given. No visible distress or psychotic episode noted prior to discharge. Denied any discomfort post using PRN Tylenol 650 mg along with Cepacol for sore throat. Instructions provided regarding transfer,along with unit policy regarding COVID guidelines. Personal belongings and home medications returned to patient; patient completed inpatient survey and discharge safety plan.  Pt d/c to [facility]  accompanied by 2 paramedics via a stretcher around 1740.  Patient had a mask on. 2 copies of signed voluntary consent  that were successfully faxed to [facility] sent with patient.

## 2021-02-09 NOTE — Progress Notes (Addendum)
PSYCHIATRY INPATIENT PROGRESS NOTE    Date/Time: 02/09/21 11:59 AM  Patient's Name: [name], [name]  MR#:: [id number]  DOB: 1960/07/21    Patient is a 60 y.o male with PPHx of PTSD, Bipolar disorder, Schizoaffective disorder and poly substance abuse, presenting to the ED for SI . PT BIB a co worker after he found pt looking for a knife to harm himself while at work)    Today, pt. Was seen pacing around the unit, talking loud, irritable and angry. He tested Positive for Covid, in the process of being transferred to [facility] today. Meanwhile, he is refusing to follow isolation precautions, getting angry with the staff for asking him to wear a mask and stay in his room.     He said, ' I got PTSD now. How did I get Covid? You all gave it to me. You are treating me like this because I am a black man. This is all money games. You want to make money on me while you shut me down in the room. You all running  a [employer] here. This is [employer]. I want to go home". However, he changed his mind when I informed him that [facility] has TV in the rooms. His behavior has changed right away. He readily agreed and asked multiple times how soon he will be transferred there.     Since the meeting, he has been quite cooperative, following the directions . In general, he is compliant with medications, attending groups. He denies any symptoms of Covid infection.     Medications:   Current medications and doses:    Current Facility-Administered Medications   Medication Dose Route Frequency Provider Last Rate Last Admin    acetaminophen (TYLENOL) tablet 1,000 mg  1,000 mg Oral QHS Bronsther, Rachel B, MD   1,000 mg at 02/08/21 2129    acetaminophen (TYLENOL) tablet 650 mg  650 mg Oral TID PRN Elwyn Reach, MD   650 mg at 02/09/21 0707    alum & mag hydroxide-simethicone (MAALOX PLUS) 200-200-20 mg/5 mL suspension 30 mL  30 mL Oral Q6H PRN Berneta Levins, MD        ARIPiprazole (ABILIFY) tablet 20 mg  20 mg Oral Daily  Dixon Boos B, MD   20 mg at 02/09/21 0831    benzocaine-menthol (CEPACOL/CHLORASEPTIC) lozenge 1 lozenge  1 lozenge Buccal Q1H PRN Berneta Levins, MD   1 lozenge at 02/09/21 0854    diphenhydrAMINE (BENADRYL) capsule 50 mg  50 mg Oral TID PRN Berneta Levins, MD        Or    diphenhydrAMINE (BENADRYL) injection 50 mg  50 mg Intramuscular TID PRN Berneta Levins, MD        docusate sodium (COLACE) capsule 100 mg  100 mg Oral BID PRN Berneta Levins, MD        escitalopram (LEXAPRO) tablet 10 mg  10 mg Oral Daily Dixon Boos B, MD   10 mg at 02/09/21 0831    fluticasone (FLONASE) 50 MCG/ACT nasal spray 1 spray  1 spray Each Nare Daily Berneta Levins, MD   1 spray at 02/09/21 0833    hydrocortisone (CORTAID) 1 % cream   Topical BID Elwyn Reach, MD   Given at 02/09/21 0834    hydrOXYzine (VISTARIL) capsule 100 mg  100 mg Oral QHS Elwyn Reach, MD   100 mg at 02/08/21 2129    hydrOXYzine (VISTARIL) capsule 25 mg  25 mg Oral Q4H PRN Berneta Levins, MD  hydrOXYzine (VISTARIL) capsule 25 mg  25 mg Oral TID MEALS Bronsther, Hyacinth Meeker, MD   25 mg at 02/09/21 1140    ibuprofen (ADVIL) tablet 800 mg  800 mg Oral TID PRN Berneta Levins, MD   800 mg at 02/09/21 0854    loperamide (IMODIUM) capsule 2 mg  2 mg Oral Q3H PRN Berneta Levins, MD        nystatin (NYSTOP) powder   Topical BID Saddie Benders, MD   Given at 02/09/21 0839    OLANZapine (ZyPREXA) tablet 10 mg  10 mg Oral BID PRN Berneta Levins, MD        ondansetron [facility]) tablet 4 mg  4 mg Oral Q8H PRN Berneta Levins, MD        QUEtiapine (SEROquel) tablet 50 mg  50 mg Oral QHS PRN Berneta Levins, MD           Legal Status:    voluntary    Subjective Findings:     Symptoms:  depression, suicidal thoughts, Cocaine abuse  Medication Side Effects:  None  Collateral History:   Provided  Family Involvement: Completed  Psychosocial Functioning: Improving  Attending Groups: yes    Objective Findings:    Alert, Well oriented AAM    Mental Status  Evaluation:     Appearance:  age appropriate and overweight   Behavior:  normal   Speech:  normal pitch and normal volume   Mood:  normal   Affect:  mood-congruent   Thought Process:  normal   Thought Content:  normal   Sensorium:  person, place, time/date, situation, day of week, month of year, and year   Cognition:  grossly intact   Insight:  fair   Judgment:  fair       Neuro Vegetative Functions:     Energy: Energy level is good.  The patient is able to perform most usual functions.  Sleep: Sleep pattern was reported as intact and regular with no initial, middle or late insomnia  Appetite: Appetite is reported to be intact, with no significant changes in weight.    Vital Signs:     Vitals:    02/09/21 0923   BP: 124/80   Pulse: 94   Resp: 16   Temp: 98.2 F (36.8 C)   SpO2: 99%        Laboratory Assessment:     Results       Procedure Component Value Units Date/Time    COVID-19 (SARS-CoV-2) only ([facility]) - Required by Extended care facility discharge within 24 hours [[id number]]  (Abnormal) Collected: 02/08/21 1800    Specimen: Nasopharyngeal Updated: 02/08/21 1922     Purpose of COVID testing Screening     SARS-CoV-2 Specimen Source Nasal Swab     SARS CoV 2 Overall Result Detected    Narrative:      o Collect and clearly label specimen type:  o PREFERRED-Upper respiratory specimen: One Nasal Swab in  [employer].  o Hand deliver to laboratory ASAP  Testing as required by extended care facility?->Yes  Screening             Assessment:   Clinical formulation:  60 years old AAM with Cocaine abuse was admitted for suicidal thoughts.     He has capacity to make medical decisions. He identified his mother Alexandru Moorer [phone] as his next of kin    Diagnoses:     Axis I: Bipolar Disorder, depression, Cocaine abuse, Substance induced Mood Disorder  Axis  XL:KGMWN  Axis III: See problem list in the medical record  Axis IV: Other psychosocial or environmental problems  Axis V:41-50: serious symptoms.    Plan:      Continue Contact and Airborne isolation for Covid    Medication Plan: Continue  -Abilify 20 Mg Daily  -Lexapro 10 Mg Daily  -Vistaryl 100 Mg at bed time and 25 mg TID    Medication Education: Provided    Medical work-up plan/testing:  Completed    Plan for Family Involvement:  Yes    Aftercare Plan: Discharge to home    Ongoing hospitalization required for Danger to self    Other Providers Contact Information and Dates Contacted: Per primary team    On this admission patient educated about and provided input into their treatment plan.  Patient understands potential risks and benefits of proposed treatment plan. -    Expected length of stay: Transfer to [facility] unit today  Total floor time: 25 Minutes  At least 50% in coordination of care and counseling:Yes        Signed by: Berneta Levins, MD, MD   02/09/21 11:59 AM

## 2021-02-09 NOTE — Progress Notes (Signed)
Patient retreated to his room after bed time. Received bed for patient in [place], going to 618 when transport in scheduled. Patient to be picked up at 1630. Transportation form initiated; report given to on coming nurse

## 2021-02-09 NOTE — Progress Notes (Signed)
Transport ETA changed from 1400 to 1630.

## 2021-02-10 DIAGNOSIS — U071 COVID-19: Secondary | ICD-10-CM

## 2021-02-10 DIAGNOSIS — F3163 Bipolar disorder, current episode mixed, severe, without psychotic features: Secondary | ICD-10-CM

## 2021-02-10 DIAGNOSIS — F4310 Post-traumatic stress disorder, unspecified: Secondary | ICD-10-CM

## 2021-02-10 MED ORDER — ESCITALOPRAM OXALATE 10 MG PO TABS
10.0000 mg | ORAL_TABLET | Freq: Every day | ORAL | Status: DC
Start: 2021-02-10 — End: 2021-02-10

## 2021-02-10 MED ORDER — ESCITALOPRAM OXALATE 10 MG PO TABS
10.0000 mg | ORAL_TABLET | Freq: Every day | ORAL | Status: DC
Start: 2021-02-10 — End: 2021-02-13
  Filled 2021-02-10 (×2): qty 1

## 2021-02-10 MED ORDER — ALBUTEROL SULFATE 1.25 MG/3ML IN NEBU
1.2500 mg | INHALATION_SOLUTION | Freq: Four times a day (QID) | RESPIRATORY_TRACT | Status: DC | PRN
Start: 2021-02-10 — End: 2021-02-13
  Filled 2021-02-10 (×4): qty 3

## 2021-02-10 MED ORDER — BENZONATATE 100 MG PO CAPS
100.0000 mg | ORAL_CAPSULE | Freq: Three times a day (TID) | ORAL | Status: DC | PRN
Start: 2021-02-10 — End: 2021-02-13
  Administered 2021-02-10 – 2021-02-13 (×7): 100 mg via ORAL
  Filled 2021-02-10 (×8): qty 1

## 2021-02-10 MED ORDER — ARIPIPRAZOLE 10 MG PO TABS
20.0000 mg | ORAL_TABLET | Freq: Every day | ORAL | Status: DC
Start: 2021-02-10 — End: 2021-02-13
  Filled 2021-02-10 (×2): qty 2

## 2021-02-10 MED ORDER — ARIPIPRAZOLE 10 MG PO TABS
20.0000 mg | ORAL_TABLET | Freq: Every day | ORAL | Status: DC
Start: 2021-02-10 — End: 2021-02-10

## 2021-02-10 NOTE — Plan of Care (Signed)
Problem: At Risk for Suicide AS EVIDENCED BY...  Goal: [name] will verbalize understanding of medication, benefits, and side effects  Outcome: Not Progressing     Problem: At Risk for Suicide AS EVIDENCED BY...  Goal: [name] will remain safe during hospitalization until [place]  Outcome: Progressing     [name] is a 60 y.o. M on day 1 of admission. He remains on airborne isolation for COVID 19. A/Ox4, cooperative with assessment. Denies SI/HI/AVH. States he is only in the hospital due to COVID diagnosis. He refused his Abilify  and Lexapro stating he is not psychotic and has not taken this medication in years. Education provided. He accepted his vistaril stating that it helps him with his PTSD. Speech is pressured and is focused on religion. Able to make his needs known to staff. PRN tylenol given for headache with good effect, ibuprofen 800 mg given for knee pain with good effect, tessalon 100 mg for cough with good effect. Maintained safety.

## 2021-02-10 NOTE — Progress Notes (Signed)
Daily Note:    Affect/Mood:   Unable to assess    Thought Process:  Unable to assess    Thought Content:  Unable to assess    Interpersonal:  Isolative    Treatment Plan Goal Addressed:     Learn positive coping skills and remain safe at the hospital.    Intervention Used and Patient Response:     Patient is on isolation due to COVID and not attending groups yet. This Therapist attempted twice today to speak with Pt and assist Pt in developing his inpatient safety plan. Therapist called Pt via the intercom; however, Pt did not respond. Therapist will assess and provide emotional support as needed.

## 2021-02-10 NOTE — Progress Notes (Signed)
Checklist instructions: Score the patient once a shift and as needed. Absence of a behavior results in a score of 0. Presence or increase in the display of a behavior results in a score of 1. Maximum score (SUM) possible is 6. If a behavior is at baseline for a patient, e.g. the patient is normally confused, this will result in a score of 0. An increase in confusion will result in a score of 1.      [name] is being assessed under the Broset Violence Checklist for any escalations in potentially violent or harmful behavior.     Date of Admission:                   02/09/21                                                      Date of Assessment: 02/10/21   Assessment Time 0800      Confused 0      Irritable 0      Boisterous 0      Verbal Threats 0      Physical Threats 0      Attacking Objects 0      SUM 0          TW attempted the following interventions based on the patient's sum total score assessed on the BVC: 1= Monitoring.      Should the patient require multiple interventions or fails to engage in de-escalation with staff, please review the plan for managing the care of an escalated patient.  Please do the following:  1.  Immediately notify Security and Nursing Administrative Supervisor/Director to respond  2.  Conduct team huddle to rule out clinical causes for escalated behavior  3.  Review the note authored by the Associate Professor for additional information  4.  Document specific behaviors observed  5.  Consider SAFE Team call for additional resources              The Broset Violence Checklist (BVC) is a 6 item inventory that was designed to assist in the prediction of imminent violent behavior (24 hours perspective) in healthcare and other sectors where workplace violence is acknowledged as a serious problem.   The BVC does not give instructions on what to do or what interventions to provide when a violent episode occurs; it is a took meant to aid in the risk assessment process. Interventions  are based on the individual, the environment, and cultural sensitivity indicators.

## 2021-02-10 NOTE — Progress Notes (Signed)
Checklist instructions: Score the patient once a shift and as needed. Absence of a behavior results in a score of 0. Presence or increase in the display of a behavior results in a score of 1. Maximum score (SUM) possible is 6. If a behavior is at baseline for a patient, e.g. the patient is normally confused, this will result in a score of 0. An increase in confusion will result in a score of 1.      [name] is being assessed under the Broset Violence Checklist for any escalations in potentially violent or harmful behavior.     Date of Admission:  02/09/21                                    Date of Assessment: 02/10/21   Assessment Time 2000      Confused 0      Irritable 0      Boisterous 0      Verbal Threats 0      Physical Threats 0      Attacking Objects 0      SUM 0          TW attempted the following interventions based on the patient's sum total score assessed on the BVC: 1= Monitoring.      Should the patient require multiple interventions or fails to engage in de-escalation with staff, please review the plan for managing the care of an escalated patient.  Please do the following:  1.  Immediately notify Security and Nursing Administrative Supervisor/Director to respond  2.  Conduct team huddle to rule out clinical causes for escalated behavior  3.  Review the note authored by the Associate Professor for additional information  4.  Document specific behaviors observed  5.  Consider SAFE Team call for additional resources              The Broset Violence Checklist (BVC) is a 6 item inventory that was designed to assist in the prediction of imminent violent behavior (24 hours perspective) in healthcare and other sectors where workplace violence is acknowledged as a serious problem.   The BVC does not give instructions on what to do or what interventions to provide when a violent episode occurs; it is a took meant to aid in the risk assessment process. Interventions are based on the individual, the  environment, and cultural sensitivity indicators.

## 2021-02-10 NOTE — H&P (Signed)
ADMISSION HISTORY AND PHYSICAL EXAM    Date Time: 02/10/21 3:01 PM  Patient Name: [name]  Attending Physician: Janace Aris, MD    Assessment:   VP is a 60 year old Black man with history of arthritis, balanitis, bipolar disorder and PTSD who initially was admitted to [facility] for suicidal thoughts. During the course of the patient's hospitalization, the patient tested positive for COVID and so was transferred to [facility] given the isolation requirements that our unit can adhere to. Regarding social history, the patient reports that he lives in [place]. alone in an apartment. States that he is employed while pointing to print outs he has of himself on paper clippings. To this Clinical research associate, he denied recreational drug, alcohol, and tobacco use despite him previously admitting to other providers that he used cocaine and having a positive urine drug screen testing positive for cocaine. Currently Jas denies SI/HI/AVH. He complains now of a productive cough and is asking for an XR and antibiotics for his cough. He otherwise denies fever, chills, sore throat, etc. He seeks care for this issue at this time.   Plan:   # SARS-CoV-2  - Afebrile. O2 SAT 98% RA. PE unremarkable. C/O of productive cough.  - Initially diagnosed 02/08/21. Per [facility] IP policy, consider D/C isolation 02/18/21.  - Continue acetaminophen 650 mg PO Q6H PRN headaches and fever.  - Continue albuterol 1.25 mg NEBS Q6H PRN wheezing and SOB.  - Continue benzonatate 100 mg PO TID PRN coughing.  - Flonase 50 MCG/ACT 1 spray each nare daily.   - Will XR & consult IM if SXS worsen or hypoxic.    # Bipolar disorder  # PTSD  - Otherwise PE unremarkable. VSS.  - Most recent diagnostic tests reviewed and unremarkable.   - Pending tests: ECG, HgbA1C, lipid panel and TSH.   - Continue plan as outlined by psychiatry.     Past Medical History:     Past Medical History:   Diagnosis Date    Arthritis     Bipolar disorder     PTSD  (post-traumatic stress disorder)      Past Surgical History:   No past surgical history on file.    Family History:     Family History   Problem Relation Age of Onset    Diabetes Neg Hx     Hypertension Neg Hx      Social History:     Social History     Socioeconomic History    Marital status: Single     Spouse name: Not on file    Number of children: Not on file    Years of education: Not on file    Highest education level: Not on file   Occupational History    Not on file   Tobacco Use    Smoking status: Never    Smokeless tobacco: Never    Tobacco comments:     Pt denies tobacco use in the last 30 days   Vaping Use    Vaping Use: Never used   Substance and Sexual Activity    Alcohol use: Yes     Alcohol/week: 14.0 standard drinks     Types: 6 Glasses of wine, 6 Cans of beer, 2 Shots of liquor per week     Comment: patient admitted used alcohol with inthe past 12 month    Drug use: Never     Comment: patient used  drug use within tha past 12  months    Sexual activity: Not Currently   Other Topics Concern    Not on file   Social History Narrative    Not on file     Social Determinants of Health     Financial Resource Strain: Not on file   Food Insecurity: Not on file   Transportation Needs: Not on file   Physical Activity: Not on file   Stress: Not on file   Social Connections: Not on file   Intimate Partner Violence: Not on file   Housing Stability: Not on file     Allergies:     Allergies   Allergen Reactions    Invega [Paliperidone]     Lamictal [Lamotrigine]     Penicillins      Medications:     Medications Prior to Admission   Medication Sig    acetaminophen (TYLENOL) 500 MG tablet Take 2 tablets (1,000 mg total) by mouth nightly    hydrOXYzine (VISTARIL) 100 MG capsule Take 1 capsule (100 mg total) by mouth nightly    ARIPiprazole (ABILIFY) 20 MG tablet Take 1 tablet (20 mg total) by mouth daily    escitalopram (LEXAPRO) 10 MG tablet Take 1 tablet (10 mg total) by mouth daily    fluticasone (FLONASE) 50  MCG/ACT nasal spray 1 spray by Nasal route daily    hydrocortisone (CORTAID) 1 % cream Apply topically 2 (two) times daily    hydrOXYzine (VISTARIL) 25 MG capsule Take 1 capsule (25 mg total) by mouth 3 (three) times daily with meals    nystatin (NYSTOP) powder Apply topically 2 (two) times daily     Review of Systems:   A comprehensive review of systems was:     Constitutional: Patient denies fever, chills, headaches, fatigue.   Neuro: Denies headaches, dizziness, visual disturbances, auditory disturbances, coordination difficulties, muscle weakness, paresthesias.  Psychiatric: Currently denies SI/HI/AVH.   Cardiovascular: Denies chest pain, extremity swelling, palpitations.  Pulmonary: Denies SOB, wheezing. Complaining of productive cough.   ENT: No ear pain, sinus pain/congestion, or sore throat.   Gastrointestinal: Denies N/V/D, constipation, or abdominal pain.  Genitourinary: Denies hematuria, pyuria, dysuria, urinary frequency, hesitancy.  Integumentary: Denies any rash or lesions. No wounds.   Musculoskeletal: Reports he has history of arthritis and has chronic joint pain.     Physical Exam:     Vitals:    02/10/21 0850   BP: 107/74   Pulse: 94   Resp:    Temp: 98.8 F (37.1 C)   SpO2: 98%     Intake and Output Summary (Last 24 hours) at Date Time  No intake or output data in the 24 hours ending 02/10/21 1501    General: Patient well nourished and of appropriate weight.   Head: No wounds. Normocephalic.   Eyes: PERRLA. Eyes are anicteric.   ENT: Mucous membranes are moist and intact.   Neck: Trachea midline.   Neuro: A&Ox4. CNll-CNXll examined & WNL. Patellar DTR 2+ bilaterally. Pt demonstrates normal strength. Pt ambulating well. No visible tremor.   Cardiac: Heart regular. Rate WNL. No murmur, rubs, or gallops. Peripheral pulses are full & intact. Cap refill < 3 sec. No LE swelling or redness.   Pulmonary: LCTAB. No signs of SOB or dyspnea.   GI: Normoactive bowel sounds. Abd is soft & non-tender to touch.  No distention.   GU: This portion of PE inappropriate as patient denies issues in this system.   SKIN: No rashes, lesions, or wounds noted.   MS:  FROM both passively & actively. Pt has 5+ strength. Ambulating well.  Psych: Denies SI/HI/AVH.      Labs:     Results       ** No results found for the last 24 hours. **          Signed by: Evorn Gong, FNP-BC
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
## 2021-02-10 NOTE — Progress Notes (Signed)
Checklist instructions: Score the patient once a shift and as needed. Absence of a behavior results in a score of 0. Presence or increase in the display of a behavior results in a score of 1. Maximum score (SUM) possible is 6. If a behavior is at baseline for a patient, e.g. the patient is normally confused, this will result in a score of 0. An increase in confusion will result in a score of 1.      [name] is being assessed under the Broset Violence Checklist for any escalations in potentially violent or harmful behavior.     Date of Admission:  02/09/21                                      Date of Assessment: 02/09/21   Assessment Time 2000      Confused 0      Irritable 0      Boisterous 0      Verbal Threats 0      Physical Threats 0      Attacking Objects 0      SUM 0          TW attempted the following interventions based on the patient's sum total score assessed on the BVC: 1= Monitoring.      Should the patient require multiple interventions or fails to engage in de-escalation with staff, please review the plan for managing the care of an escalated patient.  Please do the following:  1.  Immediately notify Security and Nursing Administrative Supervisor/Director to respond  2.  Conduct team huddle to rule out clinical causes for escalated behavior  3.  Review the note authored by the Associate Professor for additional information  4.  Document specific behaviors observed  5.  Consider SAFE Team call for additional resources              The Broset Violence Checklist (BVC) is a 6 item inventory that was designed to assist in the prediction of imminent violent behavior (24 hours perspective) in healthcare and other sectors where workplace violence is acknowledged as a serious problem.   The BVC does not give instructions on what to do or what interventions to provide when a violent episode occurs; it is a took meant to aid in the risk assessment process. Interventions are based on the individual, the  environment, and cultural sensitivity indicators.

## 2021-02-10 NOTE — Plan of Care (Addendum)
[name] is alert and oriented x 4, calm and cooperative. He's irritable at times. He denies SI/HI/AVH and pain. He only endorses cough and congestion but stated that the tessalon is helping to "cough less." He remains on airborne isolation for COVID. He reports his appetite and sleep are good. He took his medication as scheduled. He endorses having "some" anxiety but vistaril does help with it. His mood is good. He maintains a safe behavior, fair hygiene, and his ADLs independently. Ongoing q15 min checks, safety maintained. Will continue to monitor pt for safety.     The pt slept for approximately 7 hrs. PRNs: cepacol     Problem: At Risk for Suicide AS EVIDENCED BY...  Goal: [name] will identify long and short term goals based on individual needs and strengths  Outcome: Progressing  Goal: [name] will remain safe during hospitalization until [place]  Outcome: Progressing  Goal: Suicide Alert Level Low  Outcome: Progressing
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
## 2021-02-10 NOTE — Progress Notes (Addendum)
SW completed chart review for this pt. Pt was admitted voluntarily to [facility] unit on 9/7 for SI. Pts co-worker found him looking for a knife to harm himself. Pt has an extensive inpatient history and a history or prior SI/SA. Initial case management assessment completed by Case Manager, Trae Wood, on 9/8. Pt currently lives in [place] but still has [place] Medicaid. Pt interested in a step down crisis program. [facility] is willing to consider, even with [place] Medicaid. Referral was sent to [facility] on 9/16. Pt tested positive for COVID on 9/16. He was transferred to [facility] on 9/17 .     Discharge Plan- [facility] vs Home

## 2021-02-11 DIAGNOSIS — Z5189 Encounter for other specified aftercare: Secondary | ICD-10-CM

## 2021-02-11 DIAGNOSIS — Z008 Encounter for other general examination: Secondary | ICD-10-CM

## 2021-02-11 NOTE — Plan of Care (Signed)
Problem: At Risk for Suicide AS EVIDENCED BY...  Goal: [name] will remain safe during hospitalization until [place]  Outcome: Progressing  Goal: Suicide Alert Level Low  Outcome: Progressing  Goal: [name] will identify triggers and protective strategies  Outcome: Progressing  Goal: [name] will verbalize understanding of medication, benefits, and side effects  Outcome: Progressing   [name] was alert and oriented x 4 with grandiose affect and pressured speech. Denies SI/HI/AVH/SIB urges. BVC is 0. Pt states they are committed to maintaining their safety, encouraged to come to staff if thoughts or urges to harm self or others increases, he verbalizes understanding. [name] is calm and cooperative, able to answer questions and maintain appropriate eye contact. Pt describes their mood as "good". They describe their  sleep/energy/appetite as "Okay".  He denies depression and anxiety. [name] states their goal for today is to get better while I am here. He was compliant with medication administration. [name] was seen in the milieu throughout the shift, seen and participated in groups, and seen with peers and staff appropriately. Q15 min safety checks maintained. Will continue to monitor and ensure safety. PRN Tylenol 650 mg given for pain/discomfort with good effect.

## 2021-02-11 NOTE — Progress Notes (Signed)
Checklist instructions: Score the patient once a shift and as needed. Absence of a behavior results in a score of 0. Presence or increase in the display of a behavior results in a score of 1. Maximum score (SUM) possible is 6. If a behavior is at baseline for a patient, e.g. the patient is normally confused, this will result in a score of 0. An increase in confusion will result in a score of 1.      [name] is being assessed under the Broset Violence Checklist for any escalations in potentially violent or harmful behavior.     Date of Admission:  02/09/2021                                                             Date of Assessment: 02/11/2021   Assessment Time 0800      Confused 0      Irritable 0      Boisterous 0      Verbal Threats 0      Physical Threats 0      Attacking Objects 0      SUM 0          TW attempted the following interventions based on the patient's sum total score assessed on the BVC: 1= Monitoring.      Should the patient require multiple interventions or fails to engage in de-escalation with staff, please review the plan for managing the care of an escalated patient.  Please do the following:  1.  Immediately notify Security and Nursing Administrative Supervisor/Director to respond  2.  Conduct team huddle to rule out clinical causes for escalated behavior  3.  Review the note authored by the Associate Professor for additional information  4.  Document specific behaviors observed  5.  Consider SAFE Team call for additional resources              The Broset Violence Checklist (BVC) is a 6 item inventory that was designed to assist in the prediction of imminent violent behavior (24 hours perspective) in healthcare and other sectors where workplace violence is acknowledged as a serious problem.   The BVC does not give instructions on what to do or what interventions to provide when a violent episode occurs; it is a took meant to aid in the risk assessment process. Interventions are  based on the individual, the environment, and cultural sensitivity indicators.

## 2021-02-11 NOTE — Progress Notes (Addendum)
[name]  60 y.o.  P618/P618.01  male  Active Hospital Problems    Diagnosis    Bipolar disorder     Legal Status: Voluntary  Vitals:    02/10/21 1955   BP: 100/66   Pulse: 79   Resp: 18   Temp: 98.1 F (36.7 C)   SpO2: 98%     Blood Sugar Level: N/A  CIWA Score: NA  COWS Score: NA  Pain Score: 0/10  Pain Location: NA   Falls Risk Score: - LOW  Falls Risk Reason: None  Affect: Calm/Appropriate  Mood normal  Hallucinations: Denies   Suicidal: No suicidal thoughts  Suicide Alert Level: Routine monitoring  Homicidal: No homicidal thoughts  Depression Level: 0/10  Anxiety Level: 4/10  Sleep Quality: good  Sleep Hours: 7 hrs.   Energy: Energy level is good.  The patient is able to perform most usual functions.  Appetite: normal  NPO Status: NA   ADLs: Independent with all self care tasks.  Hygiene: adequately groomed  Date of Last Bowel Movement: 02/10/21  Number of Groups Attended: 0 due to covid isolation   Medication/Medication Compliance: accepted  Cheeking Medication: No    LAR/Meds Over Objection: No    Medication Side Effects: none  DVT Prophylaxis: None needed  Compliant with Blood draws/Labs: non-compliant  Compliant with Vitals: compliant  Elopement risk: No    Sexual aggression: No      Acute Events Overnight: None

## 2021-02-11 NOTE — Progress Notes (Signed)
Daily Note:     Affect/Mood:   Unable to assess     Thought Process:  Unable to assess     Thought Content:  Unable to assess     Interpersonal:  Isolative     Treatment Plan Goal Addressed:      Learn positive coping skills and express feelings regarding hospitalization.     Intervention Used and Patient Response:      Patient is on isolation for COVID and not attending groups yet.Therapist provided alternative treatment materials including group worksheets (values assessment, positive affirmations, benefits of gratitude). Therapist will also assess and provide emotional support as needed.
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
## 2021-02-11 NOTE — Discharge Instr - AVS First Page (Addendum)
Post Hospital Continuing Care Plan, including the After Visit Summary (AVS) and the Physician's Discharge Summary were faxed to [phone] Dr. Rosealee Albee on 02/13/21 at 5PM.    RECOMMENDED THAT YOU COMPLY WITH THE FOLLOWING PLAN:   Follow up appointment with: Dr. Rosealee Albee with [facility]                   Date:  9/28 via telehealth      Time: 1:30PM  Location:  [address] [place] [address]   P: [phone]    FOR EMERGENCY MENTAL HEALTH or Substance Abuse Appointment, CONTACT [facility] at [phone] or your local community services board emergency mental health/substance abuse.     If interested in substance abuse treatment please call for an intake appointment:  Thyra Breed : [facility]  P: [phone]  [address] [place], [place] [address]      [facility] [address]

## 2021-02-11 NOTE — Progress Notes (Signed)
Checklist instructions: Score the patient once a shift and as needed. Absence of a behavior results in a score of 0. Presence or increase in the display of a behavior results in a score of 1. Maximum score (SUM) possible is 6. If a behavior is at baseline for a patient, e.g. the patient is normally confused, this will result in a score of 0. An increase in confusion will result in a score of 1.      [name] is being assessed under the Broset Violence Checklist for any escalations in potentially violent or harmful behavior.     Date of Admission:                                                  Date of Assessment: 02/11/2021   Assessment Time 2000      Confused 0      Irritable 0      Boisterous 0      Verbal Threats 0      Physical Threats 0      Attacking Objects 0      SUM 0          TW attempted the following interventions based on the patient's sum total score assessed on the BVC: 1= Monitoring.      Should the patient require multiple interventions or fails to engage in de-escalation with staff, please review the plan for managing the care of an escalated patient.  Please do the following:  1.  Immediately notify Security and Nursing Administrative Supervisor/Director to respond  2.  Conduct team huddle to rule out clinical causes for escalated behavior  3.  Review the note authored by the Associate Professor for additional information  4.  Document specific behaviors observed  5.  Consider SAFE Team call for additional resources              The Broset Violence Checklist (BVC) is a 6 item inventory that was designed to assist in the prediction of imminent violent behavior (24 hours perspective) in healthcare and other sectors where workplace violence is acknowledged as a serious problem.   The BVC does not give instructions on what to do or what interventions to provide when a violent episode occurs; it is a took meant to aid in the risk assessment process. Interventions are based on the individual,  the environment, and cultural sensitivity indicators.

## 2021-02-11 NOTE — Progress Notes (Addendum)
SW called [facility] to confirm if they would accept patient even if he has [place] medicaid, [employer] [phone] did confirm they would accept as long as he is homeless.     *ADD: SW followed up with patient to discuss discharge plan. Per pt and treatment team, patient wants to discharge on Wednesday 9/21. SW asked if patient is interested in substance use treatment, he reports he is no longer interested. Pt reports that he does not have any outpatient providers. SW asked about [facility] services as it is listed as an outpatient provider that he has seen in the past. Patient provided verbal consent for SW to reach out to [employer]. SW called [facility] [phone] and arranged a telehealth appointment with Dr. Rosealee Albee on 02/20/21 at 1:30 via telehealth. Discharge information to be faxed to [phone]. SW also provide substance use treatment on AVS to [facility] located in [place], [place].     Pt reports that he will discharge back to [place] and wants transportation to [employer] upon discharge. SW will assist with transport.     AMcDougald, MSW

## 2021-02-12 LAB — ECG 12-LEAD
Atrial Rate: 75 {beats}/min
IHS MUSE NARRATIVE AND IMPRESSION: NORMAL
P Axis: 63 degrees
P-R Interval: 144 ms
Q-T Interval: 382 ms
QRS Duration: 86 ms
QTC Calculation (Bezet): 426 ms
R Axis: 53 degrees
T Axis: 51 degrees
Ventricular Rate: 75 {beats}/min

## 2021-02-12 NOTE — Plan of Care (Addendum)
[name] is alert and oriented x 3, calm and cooperative. He remains on airborne isolation for covid and he's complaint with it. He was slightly irritable for not getting the PRN medications he requested earlier in the day, such as tessalon. He also complained about nurses handing him his medications at the door and they're afraid to come to his room because he has covid, "you're the only nurse who has come into my room." He was given tessalon for cough. He took his scheduled bedtime medications. He denies SI/HI/AVH and pain. He stated "I'm going home tomorrow." He maintains a safe behavior, fair hygiene, and his ADLs independently. Ongoing q15 min checks, safety maintained. Will continue to monitor pt for safety.     The pt slept for approximately 6 hrs. PRNs: tessalon.     Problem: At Risk for Suicide AS EVIDENCED BY...  Goal: De will remain safe during hospitalization until [name]  Outcome: Progressing  Goal: Suicide Alert Level Low  Outcome: Progressing  Goal: [name] will identify triggers and protective strategies  Outcome: Progressing  Goal: [name] will verbalize understanding of medication, benefits, and side effects  Outcome: Progressing

## 2021-02-12 NOTE — Plan of Care (Signed)
Writer spoke to patient via telecom. Pt's mood is calm. Pt reports that he would like to discharge on 9/21 no later than 12:00 pm. Pt would need transportation assistance to [place] [employer]. Writer will continue to monitor and assist patient accordingly.

## 2021-02-12 NOTE — Treatment Plan (Signed)
Interdisciplinary Treatment Plan Update Meeting    02/12/2021  [name]    Participants:  Patient:  [name]  Attending Physician:  Lonzo Cloud, MD  RN: Dietrich Pates  NP: Eden Emms    Short Term Goal:Going home tomorrow.   Long Term Goal: "I am [name]"     Objective:  Review response to treatment, reassess needs/goals, update plan as indicated incorporating patient's strengths and stated needs, goals, and preferences.  Pt participated in the meeting.     1. Summary of Patient Progress on Treatment Plan Goals:  Pt was tested positive for covid19. Pt requests for symptom management medications, but he refuses psych medications. Pt denies SI/HI/AVH. Pt wants to go home tomorrow.     2. Level of Patient Involvement:  Actively engaged/contributing    3. Patient Understanding of Plan of Care:  Concrete understanding of primary goal/interventions    4. Level of Agreement/Commitment to Plan of Care:  Agrees with and is committed to plan of care          Contributor Signatures:      MD_________________________________ Date___________________    SW_________________________________Date ___________________    RN _________________________________Date____________________    Patient_______________________________Date____________________    Other________________________________Date ___________________    Other________________________________Date ___________________    (This document is signed electronically by Clinical research associate and electronic co-signer.  Other participants sign a printed copy which is scanned into the EMR)

## 2021-02-12 NOTE — Progress Notes (Signed)
Daily Note:    Affect/Mood:  Appropriate    Thought Process:  Logical    Thought Content:  Delusional    Interpersonal:  Discussed Issues    Treatment Plan Goal:  Patient will attend a minimum number of group therapies daily.     Intervention  Therapist will continue to encourage group attendance and participation in order to increase positive coping skills, and express feelings regarding hospitalization.       Patient Participation in Groups:  Patient has attended 0 groups out of 4 so far today due to patient being on isolation in room as a result of being COVID +. TW met with patient 1:1 bedside to check in. Patient reported that he was in a calm mood but anxious stating "I'm ready to go tomorrow. Being on isolation has me feeling like I am in prison or something." Patient reported that he has been informed that he is discharging tomorrow which is something he is looking forward to because he does not like being isolated to the room. Patient reported that he is discharging home and does not know what his plans are for outpatient treatment services. Patient stated "I will do something if I have time." Patient reported that his spirituality and belief in God is very important to him. Patient reported that this is his source of strength. 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 Patient reported that he travels to multiple places "to do the Lord's work." Patient reported being upset because he believed he obtained COVID due to his hospitalization but is ready to discharge. Patient completed a safety plan with this TW. TW provided patient with worksheets on Coping Skills  and Stress Exploration. Will continue to monitor, assess and encourage group attendance.

## 2021-02-12 NOTE — Progress Notes (Signed)
Checklist instructions: Score the patient once a shift and as needed. Absence of a behavior results in a score of 0. Presence or increase in the display of a behavior results in a score of 1. Maximum score (SUM) possible is 6. If a behavior is at baseline for a patient, e.g. the patient is normally confused, this will result in a score of 0. An increase in confusion will result in a score of 1.      [name] is being assessed under the Broset Violence Checklist for any escalations in potentially violent or harmful behavior.     Date of Admission:                                                                         Date of Assessment:    Assessment Time 0730      Confused 0      Irritable 0      Boisterous 0      Verbal Threats 0      Physical Threats 0      Attacking Objects 0      SUM 0          TW attempted the following interventions based on the patient's sum total score assessed on the BVC: 1= Monitoring.      Should the patient require multiple interventions or fails to engage in de-escalation with staff, please review the plan for managing the care of an escalated patient.  Please do the following:  1.  Immediately notify Security and Nursing Administrative Supervisor/Director to respond  2.  Conduct team huddle to rule out clinical causes for escalated behavior  3.  Review the note authored by the RN or Team Member for additional information  4.  Document specific behaviors observed  5.  Consider SAFE Team call for additional resources              The Broset Violence Checklist (BVC) is a 6 item inventory that was designed to assist in the prediction of imminent violent behavior (24 hours perspective) in healthcare and other sectors where workplace violence is acknowledged as a serious problem.   The BVC does not give instructions on what to do or what interventions to provide when a violent episode occurs; it is a took meant to aid in the risk assessment process. Interventions are based on  the individual, the environment, and cultural sensitivity indicators.

## 2021-02-12 NOTE — Plan of Care (Signed)
Problem: At Risk for Suicide AS EVIDENCED BY...  Goal: Coen will remain safe during hospitalization until Marlette  Outcome: Progressing  Goal: Suicide Alert Level Low  Outcome: Progressing  Goal: Neel will verbalize understanding of medication, benefits, and side effects  Outcome: Progressing  Flowsheets (Taken 02/12/2021 1229)  Verbalizes understanding of medication, benefits, and side effects: Provide medication teaching including name, dosage, benefits, action, effect and side effects     Bernerd was A&O x3, disoriented to situation, denied SI/HI/AVH, and pain. Pt denied any psych symptoms and stated "I am here because I have covid. That is all."  Pt requested for PRN cough medication. Writer did not noted any coughs or congestion. Pt describes his mood as good. Pt presents with unstable mood and grandiose affect. Terion denied depression and anxiety. Pt's speech is pressured and thought process is disorganized and flight of ideas. Pt is not compliant with medication administration. Marshell stayed in his room for covid 19 isolation protocol. Pt interacted with staff appropriately. Pt is maintaining his safety, encouraged to come to staff if thoughts or urges to harm self or others develop, Ridhaan verbalizes understanding. q15 min safety checks maintained. Pt's Broset score was 0 today. Will continue to monitor and ensure safety.     PRN tylenol, tessalon was requested by pt and given for covid19 symptoms.

## 2021-02-12 NOTE — Progress Notes (Signed)
Jeannette [place]  60 y.o.  P618/P618.01  male  Active Hospital Problems    Diagnosis    Bipolar disorder     Legal Status: Voluntary  Vitals:    02/11/21 1950   BP: 109/65   Pulse: 85   Resp:    Temp: 98.6 F (37 C)   SpO2: 98%     Blood Sugar Level: N/A  CIWA Score: 0  COWS Score:0  Pain Score: 0/10  Pain Location: N/A  Falls Risk Score: - LOW  Falls Risk Reason: None  Affect: Calm/Appropriate  Mood normal  Hallucinations:  DENIES  Suicidal: No suicidal thoughts  Suicide Alert Level: Routine monitoring  Homicidal: No homicidal thoughts  Depression Level: 0/10  Anxiety Level: 0/10  Sleep Quality: good  Sleep Hours:8  Energy: Energy level is good.  The patient is able to perform most usual functions.  Appetite: normal  NPO Status: no  ADLs: Independent with all self care tasks.  Hygiene: adequately groomed  Date of Last Bowel Movement: 02/11/2021  Number of Groups Attended: 0  Medication/Medication Compliance: accepted  Cheeking Medication: No    LAR/Meds Over Objection: No    Medication Side Effects: none  DVT Prophylaxis: None needed  Compliant with Blood draws/Labs: compliant  Compliant with Vitals: compliant  Elopement risk: No    Sexual aggression: No      Acute Events Overnight:

## 2021-02-12 NOTE — Progress Notes (Signed)
Psychiatry Progress Note  (Level 1 = Problem Focused, Level 2 = Expanded Problem Focused, Level 3 = Detailed)    Patient Name: [name]            Current Date/Time:  9/20/20226:06 PM  MRN:  [id number]                            Attending Physician: Lonzo Cloud, MD  DOB: [birth year]                                Gender: male                              I. History   Informants: The patient, Treatment team notes, Chart reviews, and staff of the unit.  A. Chief Complaint or Reason for Admission    " Admitted for acute psychosis with mania and suicidal ideations "  B.History of Present Illness:   Patient is a 60 y.o. male presenting with history of Bipolar disorder and PTSD who presented to the ED voluntarily with manic symptoms and suicidal ideations. He was initially admitted to [facility] on 01/31/21 and later transferred to [facility] on 02/09/21 for ongoing co-management of his COVID-19 symptoms and psychiatric symptoms.     Legal Status : Voluntary.    Covid Positive- on contact isolation.    Interval History : 02/12/2021  Patient is seen and evaluated today by this provider, the case discussed with the attending psychiatrist, interim charting ,nursing and therapy notes reviewed.   Patient was seen in his room with COVID precautions.  He was watching TV in the room.  Upon evaluation, patient is very irritable and angry being in the locked unit and in contact isolation.  He states that he was admitted negative in the hospital [facility] but when the discharge he was positive for COVID.  He does not believe that he has COVID and also denies that he has any mental illness and needs any antipsychotic medication.  He states that he has some cough and Tessalon is working for Cough.  He denies suicidal/homicidal thoughts, also denies auditory visual hallucination.  Patient is voluntary and is asking to be discharged tomorrow on 02/14/2021.  Per nursing/therapy reports, patient is irritable and angry.  He is  refusing all the psych medication except cough medication and Vistaril for anxiety.       C.Medical History  Review of Systems  (Level 1 is none, Level 2 is 1, and Level 3 is 2+)  A complete 14 point ROS was done and was negative except for  Psychiatric: denies psychiatric symptoms.  Constitutional: No complaints  Eyes: No changes  Ears/Nose/Mouth/Throat: No changes  Cardiovascular: No complaints  Respiratory: mild to moderate cough.  Gastrointestinal: No complaints  Genitourinary: No complaints  Muscular: No complaints  Integumentary: No complaints  Neurological: No complaints  Endocrine: No complaints  Allergy/Immumologic: No complaints  Hematologic/Lymphatic: No complaints    D.Additional Past Psychiatric, Substance Use, Medical, Family and Social History  Psychiatric: No new information available as compared to previous encounter(s)  Substance Use: No new information available as compared to previous encounter(s)  Medical: No new information available as compared to previous encounter(s)  Family: No new information available as compared to previous encounter(s)  Social: No new information available as compared to previous encounter(s)  Medications:   Current Medications       Scheduled       Medication Ordered Dose/Rate, Route, Frequency Last Action    acetaminophen (TYLENOL) tablet 1,000 mg 1,000 mg, PO, QHS Given, 1,000 mg at 09/19 2131    ARIPiprazole (ABILIFY) tablet 20 mg (And Linked Group #1) 20 mg, PO, Daily Ordered    escitalopram (LEXAPRO) tablet 10 mg (And Linked Group #1) 10 mg, PO, Daily Ordered    fluticasone (FLONASE) 50 MCG/ACT nasal spray 1 spray 1 spray, EACH NARE, Daily Given, 1 spray at 09/19 0916    hydrocortisone (CORTAID) 1 % cream No Dose/Rate, TP, BID Given, No Dose/Rate at 09/20 0856    hydrOXYzine (VISTARIL) capsule 100 mg 100 mg, PO, QHS Given, 100 mg at 09/19 2131    nystatin (NYSTOP) powder No Dose/Rate, TP, BID Given, No Dose/Rate at 09/20 0857              PRN       Medication  Ordered Dose/Rate, Route, Frequency Last Action    acetaminophen (TYLENOL) tablet 650 mg 650 mg, PO, Q6H PRN Given, 650 mg at 09/20 1622    albuterol (ACCUNEB) nebulizer solution 1.25 mg 1.25 mg, NEBULIZATION, Q6H PRN Ordered    alum & mag hydroxide-simethicone (MAALOX PLUS) 200-200-20 mg/5 mL suspension 15 mL 15 mL, PO, Q6H PRN Ordered    benzocaine-menthol (CEPACOL/CHLORASEPTIC) lozenge 1 lozenge 1 lozenge, BU, Q1H PRN Given, 1 lozenge at 09/18 2305    benzonatate (TESSALON) capsule 100 mg 100 mg, PO, TID PRN Given, 100 mg at 09/20 1100    docusate sodium (COLACE) capsule 100 mg 100 mg, PO, BID PRN Ordered    hydrOXYzine (VISTARIL) capsule 25 mg 25 mg, PO, Q4H PRN Ordered    ibuprofen (ADVIL) tablet 800 mg 800 mg, PO, TID PRN Given, 800 mg at 09/19 1438    loperamide (IMODIUM) capsule 2 mg 2 mg, PO, Q3H PRN Ordered    OLANZapine (ZyPREXA) tablet 10 mg 10 mg, PO, BID PRN Ordered    ondansetron (ZOFRAN) tablet 4 mg 4 mg, PO, Q8H PRN Ordered    traZODone (DESYREL) tablet 50 mg 50 mg, PO, QHS PRN Ordered                    II. Examination   Vital signs reviewed:   Blood pressure 97/61, pulse 89, temperature 97.9 F (36.6 C), temperature source Oral, resp. rate 18, height 1.905 m (6\' 3" ), weight 109.1 kg (240 lb 9.6 oz), SpO2 99 %.     Mental Status Exam  (Level 1 is 1-5, Level 2 is 6-8, Level 3 is 9+)  General appearance: Appears chronological age, Good hygiene, and Poor grooming  Attitude/Behavior: Irritable  Motor: Psychomotor agitation  Gait: No obvious abnormalities  Muscle strength and tone: Not tested  Speech:   Spontaneous: Yes  Rate and Rhythm: Pressured  Volume: Loud  Tone: Normal  Clarity: Yes  Mood: irritable.  Affect:   Range: Expansive  Stability: Labile  Appropriateness to thought content: Unable to obtain  Intensity: Intense  Thought Process:   Coherent: Yes  Logical:  Yes at times  Associations: Loose  Thought Content:   Delusions:  Not elicited  Depressive Cognitions:  Unable to obtain  Suicidal:  No  suicidal thoughts  Homicidal:  No homicidal thoughts  Violent Thoughts:  No  Perceptions:   Dissociative Phenomena:  No  Hallucinations: No  Illusions:  No  Insight: Poor  Judgment: Impaired  Cognition:   Level of Consciousness: Intact    Psychiatric / Cognitive Instruments: None    Pertinent Physical Exam: Not relevant to current chief complaint/reason for admission    Imaging / EKG / Labs: Labs in the last 24 hours  Results       ** No results found for the last 24 hours. **          EKG Results   Cardiology Results       Procedure Component Value Units Date/Time    ECG 12 lead [[id number]] Collected: 02/11/21 1650     Updated: 02/12/21 1859     Ventricular Rate 75 BPM      Atrial Rate 75 BPM      P-R Interval 144 ms      QRS Duration 86 ms      Q-T Interval 382 ms      QTC Calculation (Bezet) 426 ms      P Axis 63 degrees      R Axis 53 degrees      T Axis 51 degrees      IHS MUSE NARRATIVE AND IMPRESSION --     NORMAL SINUS RHYTHM  MINIMAL VOLTAGE CRITERIA FOR LVH, MAY BE NORMAL VARIANT  BORDERLINE NORMAL/ABNORMAL ELECTROCARDIOGRAM  Confirmed by Leeroy Bock MD, NICHOLAS ([id number]) on 02/12/2021 6:58:54 PM      Narrative:      NORMAL SINUS RHYTHM  MINIMAL VOLTAGE CRITERIA FOR LVH, MAY BE NORMAL VARIANT  BORDERLINE NORMAL/ABNORMAL ELECTROCARDIOGRAM  Confirmed by Leeroy Bock MD, NICHOLAS ([id number]) on 02/12/2021 6:58:54 PM            III. Assessment and Plan (Medical Decision Making)     1. I certify that this patient continues to require inpatient hospitalization due to unable to care for self in the community with insufficient support available    2. Psychiatric Diagnoses  Bipolar Affective Disorder, most recent mixed episode  Posttraumatic Stress Disorder     Medical Concerns  COVID+     3.All diagnostic procedures completed since admission were reviewed. Past medical records reviewed. Coordination of care was discussed with inpatient team and as available with the outpatient team.    4. On this admission patient educated about and  provided input into their treatment plan.  Patient understands potential risks and benefits of proposed treatment plan.     5.  Assessment / Impression  Patient is a 60 y.o. male presenting with history of Bipolar disorder and PTSD who presented to the ED voluntarily with manic symptoms and suicidal ideations. He was initially admitted to [facility] on 01/31/21 and later transferred to [facility] on 02/09/21 for ongoing co-management of his COVID-19 symptoms and psychiatric symptoms.      Patient appears to be in a hypomanic state as evidenced by his pressured speech, hyperverbal and irritable mood though he denies all psych symptoms.  He denies suicidal/homicidal thoughts, also denies auditory visual hallucinations.  Patient does not believe that he has any psychiatric problem and he also believes that he is not supposed to be in a locked unit.  He was admitted voluntary and wants to be discharged home.  Though patient demonstrate some symptoms of hypomania but he is not at imminent risk of hurting himself or others.He is able to care for self and able to make a safety plan after discharge.  He has capacity to refuse medications and does not meet criteria for TDO or LAR.  Patient was admitted voluntary and is now requesting to be discharged  on Wednesday 02/13/2021.  The case discussed with the attending psychiatrist Dr.Yoosefi,MD who is in agreement that patient has right to refuse the medication because he has full capacity.  Per psychiatrist, if patient does not want to stay voluntary, he does not meet criteria to be detained.He can be discharged on Wednesday 02/13/2021 on his request.     Suicide Risk Assessment   Suicide Risk Assessment performed? Yes: Suicide Thoughts / Behaviors: None  Current Plan: None    Plan / Recommendations:    Biological Plan:  Medications:   Abilify 20 mg  po hs.(Pt refusing)  Lexapro 10 mg po daily.(Refusing)  Vistaril 100 mg po hs.(Accepting )  Medical Work-up: None required at this time  Consults:  None required at this time    Psychosocial Plan:  Individual therapy: Psycho-education and psychotherapy of the following modalities will continue to be provided during daily visits:Supportive and Insight Oriented Psychotherapy  Group and milieu therapies: daily per unit's schedule.  Continue with Social Work intervention provided for discharge planning including assistance with follow-up psychiatric care.    Plan for Family Involvement:  no updates.  Other Providers Contact Information and Dates Contacted: No Updates    Total Attending time spent 35 minutes (floor time) with more than 50 percent of time in direct patient contact, coordinating care and counseling.    Signed by: Fabio Bering, NP  02/12/2021  6:06 PM

## 2021-02-12 NOTE — Plan of Care (Signed)
Problem: At Risk for Suicide AS EVIDENCED BY...  Goal: [name] will remain safe during hospitalization until [name]  Outcome: Progressing  Goal: Suicide Alert Level Low  Outcome: Progressing  Goal: [name] will verbalize understanding of medication, benefits, and side effects  Outcome: Progressing   [name] was seen relaxing in his room during the start of shift. Patient was alert and oriented x 4 with pressured speech and hyper religious. Patient denies SI/HI/AVH/SIB. Patient is calm and cooperative with scheduled medications. Pt describes his mood as "okay". Patient remains in his room due to Covid-19 positive status. Patient slept for 8 hours with no discomfort noted. Staff will continue to monitor and ensure safety on the unit.

## 2021-02-12 NOTE — Progress Notes (Signed)
Checklist instructions: Score the patient once a shift and as needed. Absence of a behavior results in a score of 0. Presence or increase in the display of a behavior results in a score of 1. Maximum score (SUM) possible is 6. If a behavior is at baseline for a patient, e.g. the patient is normally confused, this will result in a score of 0. An increase in confusion will result in a score of 1.      [name] is being assessed under the Broset Violence Checklist for any escalations in potentially violent or harmful behavior.     Date of Admission:  02/09/21                                    Date of Assessment: 02/12/21   Assessment Time 2000      Confused 0      Irritable 0      Boisterous 0      Verbal Threats 0      Physical Threats 0      Attacking Objects 0      SUM 0          TW attempted the following interventions based on the patient's sum total score assessed on the BVC: 1= Monitoring.      Should the patient require multiple interventions or fails to engage in de-escalation with staff, please review the plan for managing the care of an escalated patient.  Please do the following:  1.  Immediately notify Security and Nursing Administrative Supervisor/Director to respond  2.  Conduct team huddle to rule out clinical causes for escalated behavior  3.  Review the note authored by the Associate Professor for additional information  4.  Document specific behaviors observed  5.  Consider SAFE Team call for additional resources              The Broset Violence Checklist (BVC) is a 6 item inventory that was designed to assist in the prediction of imminent violent behavior (24 hours perspective) in healthcare and other sectors where workplace violence is acknowledged as a serious problem.   The BVC does not give instructions on what to do or what interventions to provide when a violent episode occurs; it is a took meant to aid in the risk assessment process. Interventions are based on the individual, the  environment, and cultural sensitivity indicators.

## 2021-02-13 DIAGNOSIS — F310 Bipolar disorder, current episode hypomanic: Secondary | ICD-10-CM

## 2021-02-13 MED ORDER — ALBUTEROL SULFATE 1.25 MG/3ML IN NEBU
1.2500 mg | INHALATION_SOLUTION | Freq: Four times a day (QID) | RESPIRATORY_TRACT | 0 refills | Status: DC | PRN
Start: 2021-02-13 — End: 2022-06-11

## 2021-02-13 MED ORDER — HYDROXYZINE PAMOATE 50 MG PO CAPS
100.0000 mg | ORAL_CAPSULE | Freq: Every evening | ORAL | 0 refills | Status: DC | PRN
Start: 2021-02-13 — End: 2022-06-11

## 2021-02-13 MED ORDER — HYDROXYZINE PAMOATE 50 MG PO CAPS
100.0000 mg | ORAL_CAPSULE | Freq: Every evening | ORAL | 0 refills | Status: DC
Start: 2021-02-13 — End: 2021-02-13

## 2021-02-13 NOTE — Plan of Care (Signed)
Problem: At Risk for Suicide AS EVIDENCED BY...  Goal: [name] will remain safe during hospitalization until [name]  Outcome: Progressing  Goal: [name] will identify triggers and protective strategies  Outcome: Progressing  Goal: [name] will verbalize understanding of medication, benefits, and side effects  Outcome: Progressing  Goal: [name] will complete discharge safety and recovery plan  Outcome: Progressing     [name] was A&O x3, denied SI/HI/AVH, and pain. Pt is calm and cooperative with [name] associate, able to answer questions and maintain appropriate eye contact. Pt describes his mood as good. Sleep/energy/appetite good. Pt presents with normal mood and normal affect.  Pt's speech is pressured and thought process is grandiose. Pt is not compliant with medication administration, refused to take scheduled medications. [name] was on covid19 isolation protocol. Pt is maintaining his safety, encouraged to come to staff if thoughts or urges to harm self or others develop, [name] verbalizes understanding. q15 min safety checks maintained. Pt's Broset score was 0 today. Will continue to monitor and ensure safety.     PRN tessalon and tylenol.
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
## 2021-02-13 NOTE — Progress Notes (Addendum)
Patient scheduled to discharge today around noon. Follow up appointment arranged with [facility] [phone] via telehealth arranged with Dr. Rosealee Albee on 02/20/21 at 1:30. Discharge information (h&p, d/c sum, psych eval, AVS) to be faxed to [phone]. SW will provide cab to [employer]. Meds sent to inpatient pharmacy.SW will pick up medications from pharmacy and deliver to the unit. Patient should not d/c until meds are delivered.     Floor RN will call cab to [facility] once ready for d/c.     Address: [address] [place], [place], [place] [address]    AMcDougald, MSW

## 2021-02-13 NOTE — Discharge Summary -  Nursing (Signed)
[name] received discharge order.  Status upon discharge (describe: mental status, safety, etc.): [name] was A&O x3, denied SI/HI/AVH, and pain. [name] is calm and cooperative with Travis research associate, able to answer questions and maintain appropriate eye contact.     Safety plan was  completed and a copy given to patient. (if not, explain reason)    After Visit Summary, AVS, including prescribed medications, were reviewed with patient and patient was able to verbalize understanding of follow-up plan.  Patient was given a copy of their AVS and Adult Wellness, Recovery & Safety Plan. [name] refused to go over AVS and sign. [name] stated, " I don't need it." Copy of AVS given.     [name] was discharged to home per [name]. [name] states that after [name] stops by [employer], he will go home.   Accompanied by alone     Mode of transportation: Taxi    Personal belongings were returned(if not, explain reason).

## 2021-02-13 NOTE — Progress Notes (Addendum)
[name]  60 y.o.  P618/P618.01  male  Active Hospital Problems    Diagnosis    Bipolar disorder     Legal Status: Voluntary  Vitals:    02/12/21 2015   BP: 102/62   Pulse: 84   Resp:    Temp: 97.9 F (36.6 C)   SpO2: 97%     Blood Sugar Level: N/A  CIWA Score: NA  COWS Score: NA   Pain Score: 0/10  Pain Location: NA   Falls Risk Score: - LOW  Falls Risk Reason: None  Affect: Calm/Appropriate  Mood normal  Hallucinations: Denies  Suicidal: No suicidal thoughts  Suicide Alert Level: Routine monitoring  Homicidal: No homicidal thoughts  Depression Level: 0/10  Anxiety Level: 0/10  Sleep Quality: good   Sleep Hours: 7 hrs.   Energy: Energy level is good.  The patient is able to perform most usual functions.  Appetite: normal  NPO Status: NA   ADLs: Independent with all self care tasks.  Hygiene: adequately groomed  Date of Last Bowel Movement: 02/12/21  Number of Groups Attended: 0 due to covid isolation   Medication/Medication Compliance: accepted  [place] Medication: No    LAR/Meds Over Objection: No    Medication Side Effects: none  DVT Prophylaxis: None needed  Compliant with Blood draws/Labs: non-compliant  Compliant with Vitals: compliant  Elopement risk: No    Sexual aggression: No      Acute Events Overnight: None

## 2021-02-13 NOTE — Progress Notes (Signed)
Checklist instructions: Score the patient once a shift and as needed. Absence of a behavior results in a score of 0. Presence or increase in the display of a behavior results in a score of 1. Maximum score (SUM) possible is 6. If a behavior is at baseline for a patient, e.g. the patient is normally confused, this will result in a score of 0. An increase in confusion will result in a score of 1.      [name] is being assessed under the Broset Violence Checklist for any escalations in potentially violent or harmful behavior.     Date of Admission:                                                                         Date of Assessment:    Assessment Time 0730      Confused 0      Irritable 0      Boisterous 0      Verbal Threats 0      Physical Threats 0      Attacking Objects 0      SUM 0          TW attempted the following interventions based on the patient's sum total score assessed on the BVC: 1= Monitoring.      Should the patient require multiple interventions or fails to engage in de-escalation with staff, please review the plan for managing the care of an escalated patient.  Please do the following:  1.  Immediately notify Security and Nursing Administrative Supervisor/Director to respond  2.  Conduct team huddle to rule out clinical causes for escalated behavior  3.  Review the note authored by the Associate Professor for additional information  4.  Document specific behaviors observed  5.  Consider SAFE Team call for additional resources              The Broset Violence Checklist (BVC) is a 6 item inventory that was designed to assist in the prediction of imminent violent behavior (24 hours perspective) in healthcare and other sectors where workplace violence is acknowledged as a serious problem.   The BVC does not give instructions on what to do or what interventions to provide when a violent episode occurs; it is a took meant to aid in the risk assessment process. Interventions are based on  the individual, the environment, and cultural sensitivity indicators.
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
## 2022-05-26 ENCOUNTER — Emergency Department
Admission: EM | Admit: 2022-05-26 | Discharge: 2022-05-27 | Disposition: A | Discharge status: Other Acute Inpatient Hospital | Payer: No Typology Code available for payment source | Attending: Internal Medicine | Admitting: Internal Medicine

## 2022-05-26 DIAGNOSIS — Z1152 Encounter for screening for COVID-19: Secondary | ICD-10-CM | POA: Insufficient documentation

## 2022-05-26 DIAGNOSIS — F319 Bipolar disorder, unspecified: Secondary | ICD-10-CM | POA: Insufficient documentation

## 2022-05-26 DIAGNOSIS — F1410 Cocaine abuse, uncomplicated: Secondary | ICD-10-CM | POA: Insufficient documentation

## 2022-05-26 DIAGNOSIS — F3164 Bipolar disorder, current episode mixed, severe, with psychotic features: Secondary | ICD-10-CM

## 2022-05-26 DIAGNOSIS — R079 Chest pain, unspecified: Secondary | ICD-10-CM

## 2022-05-26 DIAGNOSIS — N390 Urinary tract infection, site not specified: Secondary | ICD-10-CM | POA: Insufficient documentation

## 2022-05-26 LAB — COMPREHENSIVE METABOLIC PANEL
ALT: 20 U/L (ref 0–55)
AST (SGOT): 38 U/L (ref 5–41)
Albumin/Globulin Ratio: 1.1 (ref 0.9–2.2)
Albumin: 3.9 g/dL (ref 3.5–5.0)
Alkaline Phosphatase: 97 U/L (ref 37–117)
Anion Gap: 11 (ref 5.0–15.0)
BUN: 26 mg/dL (ref 9.0–28.0)
Bilirubin, Total: 0.7 mg/dL (ref 0.2–1.2)
CO2: 24 mEq/L (ref 17–29)
Calcium: 8.9 mg/dL (ref 8.5–10.5)
Chloride: 104 mEq/L (ref 99–111)
Creatinine: 1.1 mg/dL (ref 0.5–1.5)
Globulin: 3.6 g/dL (ref 2.0–3.6)
Glucose: 85 mg/dL (ref 70–100)
Potassium: 3.6 mEq/L (ref 3.5–5.3)
Protein, Total: 7.5 g/dL (ref 6.0–8.3)
Sodium: 139 mEq/L (ref 135–145)
eGFR: >60 mL/min/{1.73_m2} (ref >=60)

## 2022-05-26 LAB — URINALYSIS WITH REFLEX TO MICROSCOPIC EXAM - REFLEX TO CULTURE
Bilirubin, UA: NEGATIVE
Glucose, UA: NEGATIVE
Nitrite, UA: NEGATIVE
Specific Gravity UA: 1.028 (ref 1.001–1.035)
Urine pH: 6 (ref 5.0–8.0)
Urobilinogen, UA: 4 mg/dL — AB (ref 0.2–2.0)

## 2022-05-26 LAB — URINE DRUGS OF ABUSE SCREEN
Barbiturate Screen, UR: NEGATIVE
Benzodiazepine Screen, UR: POSITIVE — AB
Cannabinoid Screen, UR: NEGATIVE
Cocaine, UR: POSITIVE — AB
Opiate Screen, UR: NEGATIVE
PCP Screen, UR: NEGATIVE
Urine Amphetamine Screen: NEGATIVE
Urine Fentanyl: NEGATIVE

## 2022-05-26 LAB — CBC AND DIFFERENTIAL
Absolute NRBC: 0 10*3/uL (ref 0.00–0.00)
Basophils Absolute Automated: 0.02 10*3/uL (ref 0.00–0.08)
Basophils Automated: 0.3 %
Eosinophils Absolute Automated: 0.51 10*3/uL — ABNORMAL HIGH (ref 0.00–0.44)
Eosinophils Automated: 8.7 %
Hematocrit: 43.6 % (ref 37.6–49.6)
Hgb: 14 g/dL (ref 12.5–17.1)
Immature Granulocytes Absolute: 0.01 10*3/uL (ref 0.00–0.07)
Immature Granulocytes: 0.2 %
Instrument Absolute Neutrophil Count: 3.1 10*3/uL (ref 1.10–6.33)
Lymphocytes Absolute Automated: 1.67 10*3/uL (ref 0.42–3.22)
Lymphocytes Automated: 28.5 %
MCH: 28.5 pg (ref 25.1–33.5)
MCHC: 32.1 g/dL (ref 31.5–35.8)
MCV: 88.8 fL (ref 78.0–96.0)
MPV: 9.3 fL (ref 8.9–12.5)
Monocytes Absolute Automated: 0.55 10*3/uL (ref 0.21–0.85)
Monocytes: 9.4 %
Neutrophils Absolute: 3.1 10*3/uL (ref 1.10–6.33)
Neutrophils: 52.9 %
Nucleated RBC: 0 /100 WBC (ref 0.0–0.0)
Platelets: 213 10*3/uL (ref 142–346)
RBC: 4.91 10*6/uL (ref 4.20–5.90)
RDW: 13 % (ref 11–15)
WBC: 5.86 10*3/uL (ref 3.10–9.50)

## 2022-05-26 LAB — SARS-COV-2 (COVID-19) RNA, PCR (LIAT): SARS-CoV-2 Overall Result: NOT DETECTED

## 2022-05-26 LAB — SALICYLATE LEVEL: Salicylate Level: <5 mg/dL — ABNORMAL LOW (ref 15.0–30.0)

## 2022-05-26 LAB — ETHANOL (ALCOHOL) LEVEL: Alcohol: NOT DETECTED mg/dL

## 2022-05-26 LAB — LACTIC ACID: Lactic Acid: 1.9 mmol/L (ref 0.2–2.0)

## 2022-05-26 LAB — ACETAMINOPHEN LEVEL: Acetaminophen Level: <3 ug/mL — ABNORMAL LOW (ref 10–30)

## 2022-05-26 MED ORDER — STERILE WATER FOR INJECTION IJ/IV SOLN (WRAP)
1.0000 g | Freq: Once | INTRAMUSCULAR | Status: AC
Start: 2022-05-26 — End: 2022-05-26
  Administered 2022-05-26: 1 g via INTRAVENOUS
  Filled 2022-05-26: qty 1000

## 2022-05-26 NOTE — Progress Notes (Signed)
Patient accepted to [facility] inpatient psychiatric unit by Dr. Carrie Mew.

## 2022-05-26 NOTE — ED Provider Notes (Signed)
EMERGENCY DEPARTMENT HISTORY AND PHYSICAL EXAM      Date: 05/26/2022  Patient Name: [name]    History of Presenting Illness     Chief Complaint   Patient presents with   . Hallucinations   . Psychiatric Evaluation   . Suicidal       History Provided By: pt    Chief Complaint:   Onset:   Timing:   Location:   Quality:   Severity:   Modifying Factors:   Associated Symptoms:     Additional History: [name] is a 62 y.o. male who says he has bipolar disorder, ptsd, polysubstance abuse. 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 [place] He says he has not had his medicines zyprexa, vistaril for a couple weeks. Pt says that he has been feeling manic, not sleeping, racing thoughts, hearing voices. He says he is a "cutter" and has thoughts of slitting his throat or blowing his brains out though he does not own a gun. He has been binge drinking and says he last used cocaine a few weeks ago. He says he takes motrin for arthritis but denies other chronic medical problems.     PCP: Pcp, None, MD      No current facility-administered medications for this encounter.     Current Outpatient Medications   Medication Sig Dispense Refill   . acetaminophen (TYLENOL) 500 MG tablet Take 2 tablets (1,000 mg total) by mouth nightly (Patient not taking: Reported on 05/26/2022)     . albuterol (ACCUNEB) 1.25 MG/3ML nebulizer solution Take 3 mLs (1.25 mg total) by nebulization every 6 (six) hours as needed for Wheezing or Shortness of Breath (Patient not taking: Reported on 05/26/2022) 20 mL 0   . ARIPiprazole (ABILIFY) 20 MG tablet Take 1 tablet (20 mg total) by mouth daily (Patient not taking: Reported on 05/26/2022)     . fluticasone (FLONASE) 50 MCG/ACT nasal spray 1 spray by Nasal route daily (Patient not taking: Reported on 05/26/2022)     . hydrocortisone (CORTAID) 1 % cream Apply topically 2 (two) times daily (Patient not taking: Reported on 05/26/2022) 28 g 0   . hydrOXYzine (VISTARIL)  50 MG capsule Take 2 capsules (100 mg total) by mouth nightly as needed for Anxiety (Sleep.) 28 capsule 0       Past History     Past Medical History:  Past Medical History:   Diagnosis Date   . Arthritis    . Bipolar disorder    . PTSD (post-traumatic stress disorder)        Past Surgical History:  History reviewed. No pertinent surgical history.    Family History:  Family History   Problem Relation Age of Onset   . Diabetes Neg Hx    . Hypertension Neg Hx        Social History:  Social History     Tobacco Use   . Smoking status: Never   . Smokeless tobacco: Never   . Tobacco comments:     Pt denies tobacco use in the last 30 days   Vaping Use   . Vaping Use: Never used   Substance Use Topics   . Alcohol use: Yes     Alcohol/week: 14.0 standard drinks of alcohol     Types: 6 Glasses of wine, 6 Cans of beer, 2 Shots of liquor per week     Comment: pt stated "I drank 3 six packs  last night"   . Drug use: Yes     Comment: marijuana and cocaine       Allergies:  Allergies   Allergen Reactions   . Invega [Paliperidone]    . Lamictal [Lamotrigine]    . Penicillins        Review of Systems   Review of Systems     Physical Exam   BP 144/80   Pulse (!) 101   Temp 98.2 F (36.8 C) (Oral)   Resp 16   SpO2 96%   Physical Exam  Vitals reviewed.   Constitutional:       Appearance: He is not toxic-appearing or diaphoretic.   HENT:      Head: Normocephalic and atraumatic.      Nose: No congestion.   Eyes:      Conjunctiva/sclera: Conjunctivae normal.   Cardiovascular:      Rate and Rhythm: Normal rate and regular rhythm.   Pulmonary:      Effort: Pulmonary effort is normal.      Breath sounds: Normal breath sounds.   Abdominal:      General: Bowel sounds are normal.      Palpations: Abdomen is soft.      Tenderness: There is no abdominal tenderness.   Musculoskeletal:      Right lower leg: No edema.      Left lower leg: No edema.   Skin:     General: Skin is warm and dry.   Neurological:      Mental Status: He is alert.    Psychiatric:         Attention and Perception: Attention normal.         Mood and Affect: Mood is anxious.         Speech: Speech normal.         Behavior: Behavior normal.         Thought Content: Thought content is paranoid. Thought content does not include homicidal ideation.         Cognition and Memory: Cognition normal.         Diagnostic Study Results     Labs -     Results       ** No results found for the last 24 hours. **            Radiologic Studies -   Radiology Results (24 Hour)       ** No results found for the last 24 hours. **        .      Medical Decision Making   I am the first provider for this patient.    Vital Signs-Reviewed the patient's vital signs.   Patient Vitals for the past 12 hrs:   BP Temp Pulse Resp   05/26/22 1355 144/80 98.2 F (36.8 C) (!) 101 16       Pulse Oximetry Analysis - Normal 96 % on ra    EKG:  Interpreted by the EP.   Time Interpreted:    Rate: 77   Rhythm: Normal Sinus Rhythm    Interpretation: no acute st changes        ED Course:       ED Course as of 05/26/22 1727   Mon May 26, 2022   1716 Acetaminophen Level(!)  Negative,  [AN]   1717 Salicylate Level(!)  Negative  [AN]   1717 Comprehensive metabolic panel  normal [AN]   1610 COVID-19 (SARS-CoV-2) only ([facility]) - Behavioral  health admission (no isolation)  Negative, [AN]   1717 Ethanol (Alcohol) Level  none [AN]   1717 CBC and differential(!)  Normal  [AN]   1722 Pt accepted to [facility], psych unit pending Feliciana Rossetti [AN]      ED Course User Index  [AN] Herschel Senegal, MD     Medical Decision Making  Amount and/or Complexity of Data Reviewed  Labs: ordered. Decision-making details documented in ED Course.  ECG/medicine tests: ordered and independent interpretation performed. Decision-making details documented in ED Course.    Risk  OTC drugs.  Prescription drug management.  Decision regarding hospitalization.  Diagnosis or treatment significantly limited by social determinants of health.       Primary Admitting  Service:    I have discussed this case with Dr. Marland Kitchen {AdmittingService:53790} at *** (time), who accepts patient for admission and requests {Obs vs Inpatient:53791} {Dispo Unit:53792} bed.     For Surgical Admissions (or any patients with potential surgical or procedural intervention during admission): only if applicable, otherwise delete this section    Anticoagulated: {YES/NO:21936}. If yes, name of medication: ***  Last PO intake: *** (date/time)  Current NPO status: *** (e.g. NPO now, NPO after midnight, etc)    Consultant(s): (if applicable, otherwise delete this section)    I have discussed this case with consultant Dr. Marland Kitchen (name and specialty) at *** (time).   Recommendations were as follows: ***                  Provider Notes: ***    Procedures:    Core Measures:    Critical Care Time:     Diagnosis     Clinical Impression: No diagnosis found.    _______________________________    Attestations:  This note is prepared by Gay Filler, MD.     Gay Filler, MD.  I confirm that the note above accurately reflects all work, treatment, procedures, and medical decision making performed by me.    _______________________________

## 2022-05-26 NOTE — ED Triage Notes (Signed)
[facility] EMERGENCY DEPARTMENT  Provider in Triage Note        Patient Name: [name]    Chief Complaint: No chief complaint on file.      HPI: [name] is a 62 y.o. male, who has had a rapid medical screening evaluation initiated by myself.  Pt state that he lose this medication for 2 wks pt state that he had been hearing voices that have been telling him to hurt himself. Pt state that he does want to hurt himself. No homicidal thoughts     Medical/Surgical/Social history: as per HPI    Vitals: There were no vitals taken for this visit.    Pertinent brief exam:   Examination of area of concern:     Preliminary orders:  mental health eval     Symptom based preliminary diagnosis/MDM: SI    Patient advised to remain in the ED until further evaluation can be performed. Patient instructed to notify staff of any changes in condition while waiting.  This assessment is an initial evaluation to expedite care.

## 2022-05-26 NOTE — Consults (Signed)
PSYCHIATRIC EMERGENCY ROOM  ASSESSMENT     Date of Service: May 26, 2022  Attending Provider: Herschel [place], MD   Time of ED admission: 05/26/2022  1:56 PM   Time consult requested: 1537  Time consult began: 1600  Time consult ended: 1634      Summary of assessment:  Patient is a 62 y.o.  domiciled, employed [place] male with psychiatric history of PTSD, bipolar disorder, polysubstance abuse, presents to ED with worsening hallucination, "feeling manic, not taken medications in 2 weeks, binge drinking alcohol and seeking psychiatric evaluations.     Upon assessment he is alert and oriented cooperative, he is circumstantial and hyper-verbal about his current mental state. He endorses decreased need for sleep, racing mind, high energy and command auditory hallucinations as well as paranoia about demons attacking him. He also endorses hopelessness, helplessness and low mood with increased suicidal ideations. He has been non-compliant with his medications for past 2 weeks and using alcohol to numb his feelings. He has no outpatient providers. Recommendation is for inpatient admission for treatment and safe disposition and patient is agreeable to this. He is currently accepted to [facility] hospital inpatient psychiatric unit.       Psychiatric Diagnoses:   Bipolar disorder, current episode mixed, severe, with psychotic features     Medical Diagnoses:   No medical history       Suicide Risk Assessment:    (From the SAFE-T Protocol C-SSRS flow sheet)  Chronic risk: Moderate  Acute risk: Elevated, A  Interventions to lower risk: recommended inpatient admission     Safety plan completed on discharge: N/A    Plan/Disposition:   --Recommend inpatient admission, bed search initiated     --Meds: Defer to inpatient   --Follow-up: Defer to inpatient treatment team     Justification for disposition:   Pt with CAH with voices telling him to kill himself, also expressing frustrations with life and planning to cut  himself. Agreeable to inpatient admission for treatment and safe disposition    If patient is voluntarily admitted to an inpatient psychiatric unit and decides to leave AMA within the first 8 hours on the unit, is there an identified petitioner?N/A    Informants: Patient, medical record, ED staff    Chief Complaint:   Chief Complaint   Patient presents with    Hallucinations    Psychiatric Evaluation    Suicidal     [place] Suicide Severity Rating Scale:     05/26/22 Archer Suicide Severity Rating Scale (Short Version)   1. In the past month - Have you wished you were dead or wished you could go to sleep and not wake up? Yes   2. In the past month - Have you actually had any thoughts of killing yourself? Yes   3. In the past month - Have you been thinking about how you might kill yourself? Yes   4. In the past month - Have you had these thoughts and had some intention of acting on them? No   5. In the past month - Have you started to work out or worked out the details of how to kill yourself? Do you intend to carry out this plan? No   6. Have you ever done anything, started to do anything, or prepared to do anything to end your life No   CSSRS Risk Level  Moderate   Specific Questioning About Thoughts, Plans, and Suicidal Intent (SAFE-T)   How many  times have you had these thoughts? (Past Month) 4   When you have the thoughts how long do they last? (Past Month) 4   Could/can you stop thinking about killing yourself or wanting to die if you want to? (Past Month) 4   Are there things - anyone or anything (e.g. family, religion, pain of death) - that stopped you from wanting to die or acting on thoughts of suicide? (Past Month) 3   What sort of reasons did you have for thinking about wanting to die or killing yourself?  Was it to end the pain or stop the way you were feeling (in other words you couldn't go on living with this pain or how you were feeling) or was it to get attention 5   Total Score of  Intensity of Ideation 20   Step 2: Identify Risk Factors   Clinical Status (Current/Recent) Hopelessness or despair;Mixed affective episode (e.g. Bipolar);Command hallucinations to hurt self;Psychosis   Clinical Status (Lifetime) PTSD;History of prior suicide attempts, or self-injurious behavior   Precipitants/Stressors Inadequate social supports;Current or pending isolation or feeling alone;Triggering events leading to humiliation, shame, and/or despair (e.g. Loss of relationship, financial or health status) (real or anticipated)   Treatment History / Dx Previous psychiatric diagnoses and treatments;Non-adherent to treatment or not receiving treatment   Access to lethal methods No, does not have access to lethal methods   Step 3: Identify Protective Factors   Internal Patient denies   External Engaged in work or school   Step 4: Guidelines to Determine Level of Risk   Suicide Risk Level Moderate   Step 5: Possible Interventions to LOWER Risk Level   Behavioral Health Ambulatory, or Emergency Department Initiate inpatient admission process   Step 6: Documentation   Summary of Evaluation pt with CAH with voices telling him to kill himself, also expressing frustrations with life and planning to cut himself     HPI:  [name] is a 62 y.o.  domiciled, employed [place] American male with psychiatric history of PTSD, bipolar disorder, polysubstance abuse, presents to ED with worsening hallucination, "feeling manic, not taken medications in 2 weeks, binge drinking alcohol and seeking psychiatric evaluations.     TW evaluated pt in ED via Tele-video, verbal consent was sought. He is alert and oriented  x 4, circumstantial in his responses  and hyper-verbal. According to patient he has not slept in 3-4 days, he mind continue to race non-stop, he is feeling "too many emotions and energy", he continues to hear unidentified voices telling him to kill himself but he can't stop the voices. He states " I feel like my  mind is racing 100 mile and hour and hearing voices of demons out there that is attacking me". He tried to use alcohol to numb these feelings and voices since he lost his medications 2 weeks ago but that has not helped him. His BAL was non-detected. He also has history of marijuana and cocaine abuse. He has no outpatient psychiatrist or therapist and seeking inpatient admission for treatment.       Psychiatric Review of Systems (positives bolded)  Safety:  self-injurious behaviors, suicidal thoughts/plans/intent,   Depression: insomnia/hypersomnia, decrease in concentration, changes in appetite (increased/decreased), suicidal ideation   Mania: irritable/expansive mood> 7 days PLUS decreased need for sleep, more talkative  Anxiety:  irritability, sleep disturbance  Psychosis: Auditory hallucinations,   PTSD:  while endorsing trauma he denies current associated symptoms of flashbacks, nightmares, avoidant behavior, dissociation  History of Violence/Aggression:   Violence toward self:   Within the Last 6 Months: No  Greater than 6 Months Ago: No  Violence toward others  Within the Last 6 Months:: No  Greater than 6 Months Ago: No    Past Psychiatric History:   Previous diagnoses:  PTSD, Bipolar disorder, Schizoaffective disorder, Schizophrenia, Alcohol Abuse, Cocaine abuse     Previous psychiatric treatment and medication trials: Yes Zyprexa, lithium, Geodon,   Treatment and medication compliance:  no -   Previous psychiatric hospitalizations: Yes extensive inpatient hospitalizations with 8/22 at [facility], 7/22 [facility], 9/22 [facility], reports another admission in 2023 but unable to remember name of hospital   Previous suicide attempts: Yes attempted to cut himself and also engaged in SIB a year ago per his report  Currently in treatment with no outpatient providers currently   Case management services (name and contact number): None    Current Medications:  []  Unable to assess due to  psychiatric condition/mental status change  []  No currently prescribed psychotropic medications  []  Patient not taken prescribed psychotropic medications  List ALL medications with last know date taken:  --Abilify (    Substance Use History (pattern of use and last use)  [x]  Alcohol: last night 05/25/2022  [x]  Marijuana: its been 2-3 years now   []  Opioids:   []  Sedatives:   []  Stimulants:   []  Hallucinogens:   [x]  Other: Cocaine, last use 3 months     The patient had a history of delirium tremens: No  The patient has a history of withdrawal seizures: No    Social History:   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  Extended family are in [place]   Currently housed? Yes, Lives alone   Legal History:No     Contact Information (family, surrogate, DPOA, caretaker, healthcare providers):   Extended Emergency Contact Information  Primary Emergency Contact: Jannett Celestine [place]  Home Phone: [phone]  Mobile Phone: [phone]  Relation: Mother    Past Medical History:  Past Medical History:   Diagnosis Date    Arthritis     Bipolar disorder     PTSD (post-traumatic stress disorder)        Past Surgical History:  History reviewed. No pertinent surgical history.    Allergies:  Allergies   Allergen Reactions    Invega [Paliperidone]     Lamictal [Lamotrigine]     Penicillins        Family History:  Family History   Problem Relation Age of Onset    Diabetes Neg Hx     Hypertension Neg Hx         Review of Systems  A complete 14 point ROS was done and was negative except for  Psychiatric: Depression, Mania, Psychosis, and Suicidal    Current Evaluation:   Visit Vitals  BP 144/80   Pulse (!) 101   Temp 98.2 F (36.8 C) (Oral)   Resp 16   SpO2 96%          Mental Status Evaluation:  Appearance:  age appropriate   Behavior:  restless and fidgety Eye contact:  Poor   Speech:  pressured   Mood:  hopeless   Affect:  increased in intensity   Thought Process:  circumstantial   Thought  Content:  hallucinations and suicidal   Sensorium:  person, place, and time/date   Cognition:  grossly intact   Insight:  fair  Judgment:  fair       Current Laboratories/Other Tests:  Labs:   Results       Procedure Component Value Units Date/Time    CBC and differential [[id number]]  (Abnormal) Collected: 05/26/22 1547    Specimen: Blood Updated: 05/26/22 1557     WBC 5.86 x10 3/uL      Hgb 14.0 g/dL      Hematocrit 35.5 %      Platelets 213 x10 3/uL      RBC 4.91 x10 6/uL      MCV 88.8 fL      MCH 28.5 pg      MCHC 32.1 g/dL      RDW 13 %      MPV 9.3 fL      Instrument Absolute Neutrophil Count 3.10 x10 3/uL      Neutrophils 52.9 %      Lymphocytes Automated 28.5 %      Monocytes 9.4 %      Eosinophils Automated 8.7 %      Basophils Automated 0.3 %      Immature Granulocytes 0.2 %      Nucleated RBC 0.0 /100 WBC      Neutrophils Absolute 3.10 x10 3/uL      Lymphocytes Absolute Automated 1.67 x10 3/uL      Monocytes Absolute Automated 0.55 x10 3/uL      Eosinophils Absolute Automated 0.51 x10 3/uL      Basophils Absolute Automated 0.02 x10 3/uL      Immature Granulocytes Absolute 0.01 x10 3/uL      Absolute NRBC 0.00 x10 3/uL     COVID-19 (SARS-CoV-2) only ([facility]) - Behavioral health admission (no isolation) [[id number]] Collected: 05/26/22 1547    Specimen: Nasopharyngeal Updated: 05/26/22 1555     Purpose of COVID testing Screening     SARS-CoV-2 Specimen Source Nasal Swab    Narrative:      o Collect and clearly label specimen type:  o PREFERRED-Upper respiratory specimen: One Nasal Swab in  [employer].  o Hand deliver to laboratory ASAP  Indication for testing->Behavioral health admission  Screening    Comprehensive metabolic panel [[id number]] Collected: 05/26/22 1547    Specimen: Blood Updated: 05/26/22 1555    Ethanol (Alcohol) Level [[id number]] Collected: 05/26/22 1547    Specimen: Blood Updated: 05/26/22 1555    Narrative:      No alcohol for draw    Salicylate Level [[id number]] Collected:  05/26/22 1547    Specimen: Blood Updated: 05/26/22 1555    Acetaminophen Level [[id number]] Collected: 05/26/22 1547    Specimen: Blood Updated: 05/26/22 1548           EKG:   EKG Results       ** No results found for the last 48 hours. **           Imaging: No results found.     Thank you for allowing [place] to participate in the care of this patient.   These findings and recommendations were discussed with the ED team caring for this patient.    This note will be forwarded to an attending psychiatrist for review.        Signed: May 26, 2022   Hyacinth Meeker, DNP FNP  [facility]

## 2022-05-26 NOTE — ED Notes (Signed)
Bed: BL3  Expected date:   Expected time:   Means of arrival:   Comments:  MH

## 2022-05-26 NOTE — ED Triage Notes (Signed)
Patient presents to the ED ambulatory with reports of auditory hallucinations telling him to harm himself, hx of bipolar disorder and PTSD, has been off his meds x 2 weeks.

## 2022-05-26 NOTE — ED Notes (Signed)
TW passed off prior auth to the UR s. All UR contacts notified via e-mail.

## 2022-05-27 ENCOUNTER — Inpatient Hospital Stay
Admission: RE | Admit: 2022-05-27 | Discharge: 2022-06-11 | DRG: 753 | Disposition: A | Discharge status: Home / Self Care | Payer: Medicaid Managed Care Other | Source: Ambulatory Visit | Attending: Student in an Organized Health Care Education/Training Program | Admitting: Student in an Organized Health Care Education/Training Program

## 2022-05-27 DIAGNOSIS — F1410 Cocaine abuse, uncomplicated: Secondary | ICD-10-CM | POA: Diagnosis present

## 2022-05-27 DIAGNOSIS — Z79899 Other long term (current) drug therapy: Secondary | ICD-10-CM

## 2022-05-27 DIAGNOSIS — Z604 Social exclusion and rejection: Secondary | ICD-10-CM | POA: Diagnosis present

## 2022-05-27 DIAGNOSIS — F4310 Post-traumatic stress disorder, unspecified: Secondary | ICD-10-CM | POA: Diagnosis present

## 2022-05-27 DIAGNOSIS — Z91148 Patient's other noncompliance with medication regimen for other reason: Secondary | ICD-10-CM

## 2022-05-27 DIAGNOSIS — Z765 Malingerer [conscious simulation]: Secondary | ICD-10-CM

## 2022-05-27 DIAGNOSIS — Z9152 Personal history of nonsuicidal self-harm: Secondary | ICD-10-CM

## 2022-05-27 DIAGNOSIS — F1010 Alcohol abuse, uncomplicated: Secondary | ICD-10-CM | POA: Diagnosis present

## 2022-05-27 DIAGNOSIS — B353 Tinea pedis: Secondary | ICD-10-CM | POA: Diagnosis not present

## 2022-05-27 DIAGNOSIS — G4700 Insomnia, unspecified: Secondary | ICD-10-CM

## 2022-05-27 DIAGNOSIS — N481 Balanitis: Secondary | ICD-10-CM

## 2022-05-27 DIAGNOSIS — R45851 Suicidal ideations: Secondary | ICD-10-CM | POA: Diagnosis present

## 2022-05-27 DIAGNOSIS — Z555 Less than a high school diploma: Secondary | ICD-10-CM

## 2022-05-27 DIAGNOSIS — F319 Bipolar disorder, unspecified: Secondary | ICD-10-CM | POA: Diagnosis present

## 2022-05-27 DIAGNOSIS — F312 Bipolar disorder, current episode manic severe with psychotic features: Principal | ICD-10-CM

## 2022-05-27 DIAGNOSIS — R52 Pain, unspecified: Secondary | ICD-10-CM

## 2022-05-27 DIAGNOSIS — F419 Anxiety disorder, unspecified: Secondary | ICD-10-CM

## 2022-05-27 DIAGNOSIS — F309 Manic episode, unspecified: Secondary | ICD-10-CM | POA: Diagnosis present

## 2022-05-27 DIAGNOSIS — N390 Urinary tract infection, site not specified: Secondary | ICD-10-CM | POA: Diagnosis present

## 2022-05-27 LAB — ECG 12-LEAD
Atrial Rate: 77 {beats}/min
IHS MUSE NARRATIVE AND IMPRESSION: NORMAL
P Axis: 61 degrees
P-R Interval: 130 ms
Q-T Interval: 416 ms
QRS Duration: 88 ms
QTC Calculation (Bezet): 470 ms
R Axis: 65 degrees
T Axis: 61 degrees
Ventricular Rate: 77 {beats}/min

## 2022-05-27 MED ORDER — LOPERAMIDE HCL 2 MG PO CAPS
2.0000 mg | ORAL_CAPSULE | ORAL | Status: DC | PRN
Start: 2022-05-27 — End: 2022-06-11

## 2022-05-27 MED ORDER — OLANZAPINE 10 MG PO TABS
10.0000 mg | ORAL_TABLET | Freq: Two times a day (BID) | ORAL | Status: DC | PRN
Start: 2022-05-27 — End: 2022-06-11
  Administered 2022-05-27: 10 mg via ORAL
  Filled 2022-05-27: qty 1

## 2022-05-27 MED ORDER — LEVOFLOXACIN 750 MG PO TABS
750.0000 mg | ORAL_TABLET | ORAL | Status: DC
Start: 2022-05-27 — End: 2022-05-28
  Administered 2022-05-27: 750 mg via ORAL
  Filled 2022-05-27 (×2): qty 1

## 2022-05-27 MED ORDER — IBUPROFEN 400 MG PO TABS
800.0000 mg | ORAL_TABLET | Freq: Three times a day (TID) | ORAL | Status: DC | PRN
Start: 2022-05-27 — End: 2022-05-31
  Administered 2022-05-28 – 2022-05-31 (×7): 800 mg via ORAL
  Filled 2022-05-27 (×7): qty 2

## 2022-05-27 MED ORDER — LORAZEPAM 1 MG PO TABS
1.0000 mg | ORAL_TABLET | ORAL | Status: DC | PRN
Start: 2022-05-27 — End: 2022-05-28
  Administered 2022-05-27 – 2022-05-28 (×2): 1 mg via ORAL
  Filled 2022-05-27 (×2): qty 1

## 2022-05-27 MED ORDER — HYDROXYZINE PAMOATE 25 MG PO CAPS
100.0000 mg | ORAL_CAPSULE | Freq: Every evening | ORAL | Status: DC | PRN
Start: 2022-05-27 — End: 2022-06-10
  Administered 2022-05-27 – 2022-06-09 (×14): 100 mg via ORAL
  Filled 2022-05-27 (×16): qty 4

## 2022-05-27 MED ORDER — ACETAMINOPHEN 325 MG PO TABS
650.0000 mg | ORAL_TABLET | Freq: Four times a day (QID) | ORAL | Status: DC | PRN
Start: 2022-05-27 — End: 2022-05-27
  Administered 2022-05-27: 650 mg via ORAL
  Filled 2022-05-27: qty 2

## 2022-05-27 MED ORDER — TAB-A-VITE/BETA CAROTENE PO TABS
1.0000 | ORAL_TABLET | Freq: Every day | ORAL | Status: DC
Start: 2022-05-27 — End: 2022-06-11
  Administered 2022-05-27 – 2022-06-11 (×5): 1 via ORAL
  Filled 2022-05-27 (×16): qty 1

## 2022-05-27 MED ORDER — QUETIAPINE FUMARATE 25 MG PO TABS
50.0000 mg | ORAL_TABLET | Freq: Every evening | ORAL | Status: DC | PRN
Start: 2022-05-27 — End: 2022-05-27

## 2022-05-27 MED ORDER — DOCUSATE SODIUM 100 MG PO CAPS
100.0000 mg | ORAL_CAPSULE | Freq: Two times a day (BID) | ORAL | Status: DC | PRN
Start: 2022-05-27 — End: 2022-06-11

## 2022-05-27 MED ORDER — FOLIC ACID 1 MG PO TABS
1.0000 mg | ORAL_TABLET | Freq: Every day | ORAL | Status: DC
Start: 2022-05-27 — End: 2022-06-11
  Administered 2022-05-27 – 2022-06-11 (×5): 1 mg via ORAL
  Filled 2022-05-27 (×16): qty 1

## 2022-05-27 MED ORDER — OLANZAPINE 5 MG PO TABS
5.0000 mg | ORAL_TABLET | Freq: Every evening | ORAL | Status: DC
Start: 2022-05-27 — End: 2022-05-28
  Administered 2022-05-27: 5 mg via ORAL
  Filled 2022-05-27: qty 1

## 2022-05-27 MED ORDER — BENZTROPINE MESYLATE 1 MG PO TABS
1.0000 mg | ORAL_TABLET | Freq: Once | ORAL | Status: DC | PRN
Start: 2022-05-27 — End: 2022-06-11

## 2022-05-27 MED ORDER — BENZTROPINE MESYLATE 1 MG/ML IJ SOLN
2.0000 mg | Freq: Once | INTRAMUSCULAR | Status: DC | PRN
Start: 2022-05-27 — End: 2022-06-11

## 2022-05-27 MED ORDER — ONDANSETRON HCL 4 MG PO TABS
4.0000 mg | ORAL_TABLET | Freq: Three times a day (TID) | ORAL | Status: DC | PRN
Start: 2022-05-27 — End: 2022-06-11
  Administered 2022-05-31: 4 mg via ORAL
  Filled 2022-05-27: qty 1

## 2022-05-27 MED ORDER — HYDROXYZINE PAMOATE 25 MG PO CAPS
25.0000 mg | ORAL_CAPSULE | ORAL | Status: DC | PRN
Start: 2022-05-27 — End: 2022-05-28
  Administered 2022-05-27 – 2022-05-28 (×2): 25 mg via ORAL
  Filled 2022-05-27 (×2): qty 1

## 2022-05-27 MED ORDER — ALUM & MAG HYDROXIDE-SIMETH 200-200-20 MG/5ML PO SUSP
30.0000 mL | Freq: Four times a day (QID) | ORAL | Status: DC | PRN
Start: 2022-05-27 — End: 2022-06-11

## 2022-05-27 MED ORDER — THIAMINE (VITAMIN B1) 100 MG PO TABS (WRAP)
200.0000 mg | ORAL_TABLET | Freq: Every day | ORAL | Status: AC
Start: 2022-05-27 — End: 2022-06-03
  Administered 2022-05-27 – 2022-05-30 (×4): 200 mg via ORAL
  Filled 2022-05-27 (×7): qty 2
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
## 2022-05-27 NOTE — Plan of Care (Signed)
Problem: At Risk for Suicide AS EVIDENCED BY...  Goal: Patient will remain safe during hospitalization  Outcome: Progressing  Flowsheets (Taken 05/27/2022 1817)  Patient will remain safe during hospitalization:   Assess suicide risk using Tool for Assessment of Suicide Risk (TASR) (admission, prn, discharge)   Assess Suicide Risk each shift using Suicide Assessment Tool (SAT)  Note: [name] has been alert and oriented times 4 during the shift. Pt presented with flat affect and clear speech. Pt rates their anxiety a 10 on a scale of 1-10. Pt rates their mood a 1 on a scale of 1-10. Pt endorses auditory hallucinations. Pt reports hearing voices to kill and cut themselves. Pt reports feeling safe on the unit. Pt denies visual hallucinations. Tylenol given for body aches with good effect. Zyprexa and vistaril given in the morning for anxiety with minimal effect. PRN Ativan and CIWA assessment ordered by provider due to alcohol use. Pt received one dose of 1 mg of ativan at 1354. Pt compliant with scheduled medications. Pt isolative to room. Pt able to make needs known. Fluids encouraged.
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
## 2022-05-27 NOTE — H&P (Signed)
ADMISSION HISTORY AND PHYSICAL EXAM        Date Time: 05/27/22 12:16 PM  Patient Name: [name],[name]  Attending Physician: Farris Has, MD    Assessment:   [name] is a 62 y.o. male domiciled, employed [place] American male with a medical history of arthritis of the knee and shoulder, UTI and a psychiatric history of PTSD, bipolar disorder, polysubstance abuse, presents to ED with worsening hallucination, "feeling manic, not taken medications in 2 weeks, binge drinking alcohol and seeking psychiatric evaluations.     On assessment today, patient is found laying in his bed.  He is alert and oriented x 4.  He reported generalized fatigue and symptoms of withdrawal from alcohol.  He reported drinking up to 8 cans of beer a day and his last drink about 4 days ago.  Patient reported uncircumcised penis but he reprieved distally exposed to it UTI and reported 1 dose of antibiotics in the emergency room but he preferred to take p.o. medication to complete the course of treatment.  His UDS is positive for cocaine and benzos, patient denied cocaine use recently.  He denied SI/HI/AVH.        Plan:   #PTSD  #Bipolar 1 disorder   #polysubstance use disorder  Plan of care as outlined by psychiatric team's   #Alcohol use disorder  CIWA protocol in place    #UTI  Patient received 1 dose of ceftriaxone 1 g in the emergency room  He preferred p.o. medication  Complete by taking levofloxacin 75 mg daily x 5 days    Past Medical History:   Diagnosis Date    Arthritis     Bipolar disorder     PTSD (post-traumatic stress disorder)        Past Surgical History:   No past surgical history on file.    Family History:     Family History   Problem Relation Age of Onset    Diabetes Neg Hx     Hypertension Neg Hx        Social History:     Social History     Socioeconomic History    Marital status: Single     Spouse name: Not on file    Number of children: Not on file    Years of education: Not on file     Highest education level: Not on file   Occupational History    Not on file   Tobacco Use    Smoking status: Never    Smokeless tobacco: Never    Tobacco comments:     Pt denies tobacco use in the last 30 days   Vaping Use    Vaping Use: Never used   Substance and Sexual Activity    Alcohol use: Yes     Alcohol/week: 14.0 standard drinks of alcohol     Types: 6 Glasses of wine, 6 Cans of beer, 2 Shots of liquor per week     Comment: pt stated "I drank 3 six packs last night"    Drug use: Yes     Comment: marijuana and cocaine    Sexual activity: Not Currently   Other Topics Concern    Not on file   Social History Narrative    Not on file     Social Determinants of Health     Financial Resource Strain: Not on file   Food Insecurity: No Food Insecurity (05/27/2022)    Hunger Vital Sign  Worried About Programme researcher, broadcasting/film/video in the Last Year: Never true     Ran Out of Food in the Last Year: Never true   Transportation Needs: Not on file   Physical Activity: Not on file   Stress: Not on file   Social Connections: Not on file   Intimate Partner Violence: Not At Risk (05/27/2022)    Humiliation, Afraid, Rape, and Kick questionnaire     Fear of Current or Ex-Partner: No     Emotionally Abused: No     Physically Abused: No     Sexually Abused: No   Housing Stability: Unknown (05/27/2022)    Housing Stability Vital Sign     Unable to Pay for Housing in the Last Year: Not on file     Number of Places Lived in the Last Year: Not on file     Unstable Housing in the Last Year: Patient unable to answer       Allergies:     Allergies   Allergen Reactions    Invega [Paliperidone]     Lamictal [Lamotrigine]     Penicillins        Medications:     Medications Prior to Admission   Medication Sig    hydrOXYzine (VISTARIL) 50 MG capsule Take 2 capsules (100 mg total) by mouth nightly as needed for Anxiety (Sleep.)    acetaminophen (TYLENOL) 500 MG tablet Take 2 tablets (1,000 mg total) by mouth nightly (Patient not taking: Reported on 05/26/2022)     albuterol (ACCUNEB) 1.25 MG/3ML nebulizer solution Take 3 mLs (1.25 mg total) by nebulization every 6 (six) hours as needed for Wheezing or Shortness of Breath (Patient not taking: Reported on 05/26/2022)    ARIPiprazole (ABILIFY) 20 MG tablet Take 1 tablet (20 mg total) by mouth daily (Patient not taking: Reported on 05/26/2022)    fluticasone (FLONASE) 50 MCG/ACT nasal spray 1 spray by Nasal route daily (Patient not taking: Reported on 05/26/2022)    hydrocortisone (CORTAID) 1 % cream Apply topically 2 (two) times daily (Patient not taking: Reported on 05/26/2022)       Review of Systems:   A comprehensive review of systems was:   Constitutional: Patient denies fever, chills, headaches, fatigue.   Neuro: Denies headaches, dizziness, visual disturbances, auditory disturbances, coordination difficulties, muscle weakness, paresthesias.  Psychiatric: Currently denies SI/HI/AVH.   Cardiovascular: Denies chest pain, extremity swelling, palpitations.  Pulmonary: Denies SOB, wheezing, coughing.  ENT: No ear pain, sinus pain/congestion, or sore throat.   Gastrointestinal: Denies N/V/D, constipation, or abdominal pain.  Genitourinary: Denies hematuria, pyuria, dysuria, urinary frequency, hesitancy.  Integumentary: Denies any rash or lesions. No wounds.   Musculoskeletal: Denies any muscle pain. Denies any injury.    Physical Exam:     Vitals:    05/27/22 0642   BP: 108/66   Pulse: 66   Resp:    Temp: 99 F (37.2 C)   SpO2: 99%       Intake and Output Summary (Last 24 hours) at Date Time  No intake or output data in the 24 hours ending 05/27/22 1216    General: Patient appeared clean and well-nourished  Head: No wounds. Normocephalic.   Eyes: PERRLA. Eyes are anicteric.   ENT: Mucous membranes are moist and intact.   Neck: Supple. No lymphadenopathy. No goiter palpated. Trachea midline.   Neuro: A&Ox4. CNll-CNXll examined & WNL. Patellar DTR 2+ bilaterally. Pt demonstrates normal strength. Pt ambulating well. No visible tremor.    Cardiac: Heart  regular. Rate WNL. No murmur, rubs, or gallops. Peripheral pulses are full & intact. Cap refill < 3 sec. No LE swelling or redness.   Pulmonary: LCTAB. No signs of SOB or dyspnea.   GI: Normoactive bowel sounds. Abd is soft & non-tender to touch. No distention.   GU: This portion of PE inappropriate as patient denies issues in this system.   SKIN: No rashes, lesions, or wounds noted.   MS: FROM both passively & actively. Pt has 5+ strength. Ambulating well.  Psych: Denies SI/HI/AVH.     Labs:     Results       ** No results found for the last 24 hours. **            Rads:   Radiological Procedure reviewed.    I personally spent 30 minutes today, exclusive of procedures, providing care for this patient, including preparation, face to face time, EMR documentation and other services such as review of medical record, diagnostic results, patient education, counseling, and coordination of care as specified in the encounter.      Signed by: Sandie Ano, DNP FNP  Pronouns He/Him/His  Department of Psychiatry  [facility]    [place], [place] [address]   [email or web address] [phone]  [email or web address]            [facility] for

## 2022-05-27 NOTE — Progress Notes (Signed)
Per [employer] he  has Wellsite geologist  [facility]   [place]
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
## 2022-05-27 NOTE — Discharge Instr - AVS First Page (Addendum)
[place], including the After Visit Summary (AVS) and the Physician's Discharge Summary were faxed to [facility] fax [phone] 06/11/22  12 PM      RECOMMENDED THAT YOU COMPLY WITH THE FOLLOWING PLAN:   Follow up appointment with:     Admit to [facility] on Wednesday, 1/17  [place], [place] [address]   [place] or Substance Abuse Appointment, go to the nearest emergency room, call 911        [address] [place] on [place].  [address], [place] [address]  p: [phone]  f: [phone]  e: [email or web address]      Dep[facility] and [facility]ipolar Support [facility]      [facility] [place] Support [facility]  [place] - a support group of the [place]  Email: [email or web address]  Contact:  Dede  [phone]    [place]  Email: [email or web address]  Contact:  Jocelyn Lamer  [phone]
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
## 2022-05-28 MED ORDER — OLANZAPINE 10 MG PO TABS
10.0000 mg | ORAL_TABLET | Freq: Every evening | ORAL | Status: DC
Start: 2022-05-28 — End: 2022-05-29
  Administered 2022-05-28: 10 mg via ORAL
  Filled 2022-05-28: qty 1

## 2022-05-28 MED ORDER — CEPHALEXIN 500 MG PO CAPS
1000.0000 mg | ORAL_CAPSULE | Freq: Three times a day (TID) | ORAL | Status: AC
Start: 2022-05-28 — End: 2022-05-30
  Administered 2022-05-28 – 2022-05-30 (×7): 1000 mg via ORAL
  Filled 2022-05-28 (×7): qty 2

## 2022-05-28 MED ORDER — HYDROXYZINE PAMOATE 25 MG PO CAPS
50.0000 mg | ORAL_CAPSULE | ORAL | Status: DC | PRN
Start: 2022-05-28 — End: 2022-06-11
  Administered 2022-05-29 – 2022-06-10 (×12): 50 mg via ORAL
  Filled 2022-05-28 (×12): qty 2

## 2022-05-28 NOTE — Nursing Progress Note (Signed)
[name]  62 y.o.  P631/P631.01  male  Active Hospital Problems    Diagnosis    Mania     Legal Status: Voluntary  Vitals:    05/28/22 2000   BP: 122/75   Pulse: 86   Resp: 17   Temp: 97.9 F (36.6 C)   SpO2: 99%     Blood Sugar Level: N/A  CIWA Score: d/c  COWS Score: n/a  Pain Score: 0/10  Pain Location: denies at this time  Falls Risk Score: - LOW  Falls Risk Reason: None  Affect: flat  Mood anxious and depressed  Hallucinations: Auditory  Suicidal: Suicidal thoughts  Suicide Alert Level: Routine monitoring  Homicidal: No homicidal thoughts  Sleep Quality: Difficulty falling asleep and Difficulty maintaining sleep  Sleep Hours: 7  Energy: Poor, unable to carry out most of his/her usual activities.  Appetite: normal  NPO Status: n/a  ADLs: Independent with all self care tasks.  Hygiene: disheveled  Medication/Medication Compliance: accepted  Cheeking Medication: No    LAR/Meds Over Objection: No    Medication Side Effects: none  DVT Prophylaxis: None needed  Compliant with Blood draws/Labs: n/a  Compliant with Vitals: compliant  Elopement risk: No    Sexual aggression: No      Acute Events Overnight:    none

## 2022-05-28 NOTE — Progress Notes (Signed)
Daily Note: Patient did not attend any groups today despite encouragement. Patient has access to alternative treatment. Will continue to monitor, assess and encourage group attendance.

## 2022-05-28 NOTE — Nursing Progress Note (Signed)
[name]  62 y.o.  P631/P631.01  male  Active Hospital Problems    Diagnosis    Mania     Legal Status: Voluntary  Vitals:    05/27/22 2355   BP: 102/63   Pulse: 85   Resp: 17   Temp:    SpO2: 97%     Blood Sugar Level: N/A  CIWA Score: 5, 5, 3  COWS Score: n/a  Pain Score: 3/10  Pain Location: generalized  Falls Risk Score: - LOW  Falls Risk Reason: None  Affect: flat  Mood anxious and depressed  Hallucinations: Auditory  Suicidal: Suicidal thoughts  Suicide Alert Level: Routine monitoring  Homicidal: No homicidal thoughts  Sleep Quality: Difficulty falling asleep and Difficulty maintaining sleep  Sleep Hours: 6 hours, on and off  Energy: Poor, unable to carry out most of his/her usual activities.  Appetite: normal  NPO Status: n/a  ADLs: Independent with all self care tasks.  Hygiene: disheveled  Medication/Medication Compliance: accepted  Cheeking Medication: No    LAR/Meds Over Objection: No    Medication Side Effects: none  DVT Prophylaxis: None needed  Compliant with Blood draws/Labs: n/a  Compliant with Vitals: compliant  Elopement risk: No    Sexual aggression: No      Acute Events Overnight:    none

## 2022-05-28 NOTE — Progress Notes (Signed)
Patiently currently admitted to inpatient psychiatric unit, secure chat sent to current attending MD.

## 2022-05-28 NOTE — Plan of Care (Signed)
Problem: At Risk for Suicide AS EVIDENCED BY...  Goal: Patient will remain safe during hospitalization  Outcome: Progressing  Goal: Suicide Alert Level Moderate  Outcome: Progressing  Goal: Identifies triggers and protective strategies  Outcome: Progressing  Goal: Verbalizes understanding of medication, benefits, and side effects  Outcome: Progressing  Goal: Identify and participate in supportive program therapies  Outcome: Progressing     Problem: Thought Disorder  Goal: Demonstrates orientation to person place and time  Outcome: Progressing  Goal: Conducts goal directed, coherent conversation  Outcome: Progressing  Goal: Verbalizes reduction in hallucinations/delusions  Outcome: Progressing     Problem: Pain  Goal: Pain at adequate level as identified by patient  Outcome: Progressing     Problem: Pain interferes with ability to perform ADL  Goal: Pain at adequate level as identified by patient  Outcome: Progressing   [name] was found laying in bed mostly this shift, A+Ox4 with flat affect and normal speech. He reports of passive SI with no plan also AH "sometime telling me kill myself" but denies HI/VH/SIB urges. BVC is 0. Pt is restless at time, but cooperative with Probation officer, able to answer questions and maintain good eye contact. Pt describes his mood as "anxious" stated his goal for today as "be calm and get help for alcohol detoxification." He reports of adequate sleep/energy/appetite.  He rated his depression as 5/10 and anxiety as 7/10 (with 10 being the highest possible value). He reports of mild withdrawal symptoms. He was compliant with medication administration and mouth check completed. He was mostly isolative to self in the room throughout the shift, encouraged him to participate in groups, and socialize with peers. RN hourly rounding and Q15 min safety checks maintained. Able to make his needs known. Will continue to monitor and ensure safety.    PRNs- Advil given at 0820 for knees pain to good  effect. Vistaril given at 0820 for increasing anxiety. Also Ativan X 1 given at 1043 for CIWA score of 9.  Pt's CIWA has discontinued today.  CephALEXin (KEFLEX) capsule 1,000 mg, 3 times daily will start today for UTI .      "I, Simone Rodenbeck, hereby attest that I have been made aware of and reviewed the following for this patient:    - Biopsychosocial assessment;  - Diagnoses;  - Nursing needs and medical support, if applicable; and  - Treatment Plan Goals, Objectives and Strategies/Interventions.    I am involved in the care of this patient within my scope of practice and assigned tasks. I approve the treatment plan and this attestation is in lieu of a signature on the document."

## 2022-05-28 NOTE — Plan of Care (Signed)
Jalil was A+Ox4 with flat affect and normal speech, denied SI/HI/VH/SIB urges, but endorsed AH commanding to harm himself. Patient, however, feels safe on the unit, contracted for safety. BVC is 0. Pt was isolative in his room, requested snacks to be passed to room due to generalized pain, remained cooperative with Probation officer, able to answer questions and maintain good eye contact. Pt reported difficulties with sleeping, Hydroxyzine 100mg  was administered. Emerson was compliant with medication administration and mouth check completed. Patient continued to be on CIWA protocol with Ativan. Antibiotics, Levofloxacin, was initiated. Patient remained score 3~5 on CIWA during this shift. Patient denied any pain upon reassessment. Q15 min safety checks maintained. Will continue to monitor and ensure safety. Patient appeared resting comfortably in bed for 6 hours. Respiration even and unlabored.   PRNs? Why? Vistaril 100mg       "I, Dock Baccam, hereby attest that I have been made aware of and reviewed the following for this patient:    - Biopsychosocial assessment;  - Diagnoses;  - Nursing needs and medical support, if applicable; and  - Treatment Plan Goals, Objectives and Strategies/Interventions.    I am involved in the care of this patient within my scope of practice and assigned tasks. I approve the treatment plan and this attestation is in lieu of a signature on the document."    Problem: At Risk for Suicide AS EVIDENCED BY...  Goal: Patient will remain safe during hospitalization  Outcome: Progressing  Goal: Suicide Alert Level Moderate  Outcome: Progressing     Problem: Thought Disorder  Goal: Demonstrates orientation to person place and time  Outcome: Progressing  Goal: Conducts goal directed, coherent conversation  Outcome: Progressing     Problem: Mood Disorder  Goal: Reports improved mood  Outcome: Progressing     Problem: Safety  Goal: Patient will be free from injury during hospitalization  Outcome: Progressing      Problem: Pain  Goal: Pain at adequate level as identified by patient  Outcome: Progressing

## 2022-05-28 NOTE — Progress Notes (Signed)
Psychiatry Progress Note  (Level 1 = Problem Focused, Level 2 = Expanded Problem Focused, Level 3 = Detailed)    Patient Name: [name]            Current Date/Time:  1/3/20243:00 PM  MRN:  [id number]                            Attending Physician: Farris Has, MD  DOB: 1960-07-08                                Gender: male                              I. History   Informants: patient, EMR   A. Chief Complaint or Reason for Admission  "I have PTSD and Bipolar and I have been off of my medications for two weeks. I have a history of cutting myself and I have been having feeling hopeless and suicidal"   Admitted for SI and CAH.    B.History of Present Illness: Interval History     (Symptoms and qualifiers:1 for Level 1, 2 for Level 2 and 3+ for Level 3)  [name] is a 62 year old domiciled, employed, single man with past medical hx of arthritis and past psychiatric hx of PTSD, bipolar I disorder, and polysubstance use who presented with worsening depression, and SI in the context of medication nonadherence, recent alcohol and cocaine use, and social isolation.       On interview today patient asks for librium to treat "his shakes" and then appears to feign tremors.  Tremors disappear for remainder of interview and with distraction.  Patient does not spontaneous bring up depressed mood, SI, or AVH, however upon being asked he states that he is hearing CAH telling him to kill himself.  Reports some intent, due to "not being on the right meds."  Reports that it takes "a long time for him to start responding."  Reports sleeping well last night. Feels safe in the hospital.  Feels overwhelmed by life stressors, which he has trouble identifying.  He says "I am manic, and talking fast even now."        C.Medical History  Review of Systems  (Level 1 is none, Level 2 is 1, and Level 3 is 2+)  A complete 14 point ROS was done and was negative except for:   Psychiatric: see HPI   Constitutional: fatigue      D.Additional Past Psychiatric, Substance Use, Medical, Family and Social History  Psychiatric: No new information available as compared to previous encounter(s)  Substance Use: No new information available as compared to previous encounter(s)  Medical: No new information available as compared to previous encounter(s)  Family: No new information available as compared to previous encounter(s)  Social: No new information available as compared to previous encounter(s)    Medications:   Current Medications       Scheduled       Medication Ordered Dose/Rate, Route, Frequency Last Action    folic acid (FOLVITE) tablet 1 mg 1 mg, PO, Daily Given, 1 mg at 01/03 0820    levoFLOXacin (LEVAQUIN) tablet 750 mg 750 mg, PO, Q24H Given, 750 mg at 01/02 1954    multivitamin tablet 1 tablet 1 tablet, PO, Daily Given, 1 tablet at 01/03  0820    OLANZapine (ZyPREXA) tablet 10 mg 10 mg, PO, QHS Ordered    thiamine (VITAMIN B1) tablet 200 mg 200 mg, PO, Daily Given, 200 mg at 01/03 0820              PRN       Medication Ordered Dose/Rate, Route, Frequency Last Action    alum & mag hydroxide-simethicone (MAALOX PLUS) 200-200-20 mg/5 mL suspension 30 mL 30 mL, PO, Q6H PRN Ordered    benztropine (COGENTIN) tablet 1 mg (Or Linked Group #1) 1 mg, PO, Once PRN Ordered    benztropine mesylate (COGENTIN) injection 2 mg (Or Linked Group #1) 2 mg, IM, Once PRN Ordered    docusate sodium (COLACE) capsule 100 mg 100 mg, PO, BID PRN Ordered    hydrOXYzine (VISTARIL) capsule 100 mg 100 mg, PO, QHS PRN Given, 100 mg at 01/02 2258    hydrOXYzine (VISTARIL) capsule 50 mg 50 mg, PO, Q4H PRN Ordered    ibuprofen (ADVIL) tablet 800 mg 800 mg, PO, Q8H PRN Given, 800 mg at 01/03 0820    loperamide (IMODIUM) capsule 2 mg 2 mg, PO, Q3H PRN Ordered    OLANZapine (ZyPREXA) tablet 10 mg 10 mg, PO, BID PRN Given, 10 mg at 01/02 0840    ondansetron (ZOFRAN) tablet 4 mg 4 mg, PO, Q8H PRN Ordered                    II. Examination   Vital signs reviewed:   Blood  pressure 112/73, pulse 79, temperature 98.1 F (36.7 C), temperature source Oral, resp. rate 16, SpO2 98 %.     Mental Status Exam  (Level 1 is 1-5, Level 2 is 6-8, Level 3 is 9+)  General appearance: Appears chronological age, Poor hygiene, and Poor grooming  Attitude/Behavior: Calm, Cooperative, Eye Contact is  Good, and Guarded  Motor: No abnormalities noted  Gait: Not observed  Muscle strength and tone: Not tested  Speech:  Spontaneous: Yes  Rate and Rhythm: Normal  Volume: Normal  Tone: Normal  Clarity: Yes  Mood: "depressed"   Affect:  Euthymic   Range: Constricted  Stability: Stable  Appropriateness to thought content: Yes  Intensity: Normal  congruent  Thought Process:  Coherent: Yes  Logical:  Yes at times  Associations: Goal-directed  Thought Content:  Delusions:  Not elicited  Depressive Cognitions:  Hopeless, Helpless  Suicidal:  Suicidal thoughts with intent, but no clear plans   Homicidal:  No homicidal thoughts  Violent Thoughts:  No  Perceptions:  Dissociative Phenomena:  No  Hallucinations: Yes, command auditory telling him to kill himself   Insight: Fair  Judgment: Poor  Cognition:   Level of Consciousness: Intact  Orientation: Intact to self, place and time    Psychiatric / Cognitive Instruments: None    Pertinent Physical Exam: Not relevant to current chief complaint/reason for admission    Imaging / EKG / Labs: Labs in the last 24 hours  Results       ** No results found for the last 24 hours. **          EKG Results   Cardiology Results       None            III. Assessment and Plan (Medical Decision Making)     1. I certify that this patient continues to require inpatient hospitalization due to acute risk to self    2. Psychiatric Diagnoses  Bipolar 1 Disorder,  current episode depressed, severe, with psychotic features  Rule out schizoaffective disorder, bipolar type  Rule out substance-induced mood disorder   PTSD  Polysubstance use disorder (alcohol, cocaine, benzodiazepines (?))  Rule out  GAD  Rule out malingering    3.All diagnostic procedures completed since admission were reviewed. Past medical records reviewed. Coordination of care was discussed with inpatient team and as available with the outpatient team.    4. On this admission patient educated about and provided input into their treatment plan.  Patient understands potential risks and benefits of proposed treatment plan.     5.  Assessment / Impression  Patient is a 62 y.o. domiciled, employed, single male with PMH of arthritis psychiatric history of Bipolar I disorder, PTSD, and polysubstance use admitted for worsening depression and suicidal ideation in the context of medication non-adherence, social isolation, and inadequate access to care. Patient is admitted on a voluntary basis.     On evaluation today, patient endorses symptoms of bipolar depression including low mood for > 2 wks, insomnia (difficulty falling asleep and staying asleep; estimates that he sleeps 3-4 hours on average), no interest, feelings of guilt / worthlessness / hopeless / helplessness, poor energy, poor concentration, and recurrent suicidal ideation. He reports that he is "crashing from mania" and he endorses a historical pattern of grandiosity ("feeling like God"), increased goal-directed activity, decreased judgement, distractibility, need for less sleep, and pressured speech--with ongoing racing thoughts and anxiety. Furthermore, patient also endorses command auditory hallucinations of voices telling him "negative things" and instructing him to harm himself. Taken altogether, patient symptoms are consistent with a current episode of bipolar depression with mixed features and with psychotic features. However, patient also endorses a history of polysubstance use and his UDS was + for cocaine, which may also be contributing to his endorsed symptomatology.      Chart review indicates an extensive history of recent admissions and ED presentations for similar mental  health concerns, with records from the [facility] indicating that patient's most recent hospitalization was from 05/15/2022-05/23/2022, which is inconsistent with patient's narrative. Nevertheless, patient endorses depression with hopelessness and suicidal ideation, and further diagnostic clarification and evaluation is thus warranted at this time.      05/28/22:   Patient reports ongoing depressed mood, amotivation, and remains isolative to room.  He feels as though he is withdrawing from alcohol, but no objective evidence, and vital signs wnl.  He appears to embellish symptoms in order to obtain librium.  Patient continues to endorse CAH telling him to kill himself.  Plan to increase olanzapine to 10 mg qhs to target psychotic symptoms     Suicide Risk Assessment   Suicide Risk Assessment performed? Yes: Suicide Thoughts / Behaviors: Yes  Current Plan: None  Access to firearm: No  Past suicide attempts: Yes  Diagnosis of MDD, BPAD, Schizophrenia, Cluster B PD, Anorexia, Substance Use: Yes, Bipolar Depression versus substance-induced mood disorder.    Plan / Recommendations:    Biological Plan:  Medications:  Increase olanzapine to 10 mg qhs   Medical Work-up: None required at this time  Consults: None required at this time    Psychosocial Plan:  Individual therapy: Psycho-education and psychotherapy of the following modalities will continue to be provided during daily visits:Supportive  Group and milieu therapies: daily per unit's schedule.  Continue with Social Work intervention provided for discharge planning including assistance with placement.    Plan for Family Involvement: Patient Refusing  Other Providers Contact  Information and Dates Contacted: No Updates    Total Attending time spent 35 minutes (floor time) with more than 50 percent of time in direct patient contact, coordinating care and counseling.    Signed by: Farris Has, MD  05/28/2022  3:00 PM

## 2022-05-28 NOTE — Plan of Care (Addendum)
Problem: At Risk for Suicide AS EVIDENCED BY...  Goal: Patient will remain safe during hospitalization  Outcome: Progressing   PT was laying in bed this AM when SW met with him.   SW asked PT about the address that he provided to SW yesterday as the facesheet has [address] on it. PT stated that is an old address and he stated SW may update record. SW attempted to update on epic, but unable to find the address PT gave yesterday [address] [place]  (unable to find it on google as an address with a zip code)  SW shared with PT that SW was informed that he has [place]. SW shared with PT that if he lives in [place] and wants to change insurance to [place], he would need to cancel [facility]. PT stated that he doesn't want to do that. HE stated he will stay in [place] for about a week at [place] and then he will go to [employer] with his girlfriend. SW asked if PT can provide the whole address so that writer can assist him with an appt at [facility]. PT stated he will go to [address] (SW mentioned this address to him yesterday--[facility]) at [place] for meds. He declines any further assistance with an appt .  HE asked if he will leave the hospital with medications and SW shared that the DR can send scripts to [facility] to see if they are contracted with his Medicaid plan at discharge.   PT shared today that when discharge time comes, he would like to be transported to [facility] as he has a pkg to pick up, and then he will take the bus home to his apt.

## 2022-05-29 DIAGNOSIS — F319 Bipolar disorder, unspecified: Secondary | ICD-10-CM

## 2022-05-29 MED ORDER — FLUOXETINE HCL 20 MG PO CAPS
20.0000 mg | ORAL_CAPSULE | Freq: Every day | ORAL | Status: DC
Start: 2022-05-29 — End: 2022-05-30
  Administered 2022-05-29 – 2022-05-30 (×2): 20 mg via ORAL
  Filled 2022-05-29 (×2): qty 1

## 2022-05-29 MED ORDER — OLANZAPINE 5 MG PO TABS
15.0000 mg | ORAL_TABLET | Freq: Every evening | ORAL | Status: DC
Start: 2022-05-29 — End: 2022-05-31
  Administered 2022-05-29: 15 mg via ORAL
  Filled 2022-05-29: qty 1

## 2022-05-29 NOTE — Plan of Care (Addendum)
Problem: At Risk for Suicide AS EVIDENCED BY...  Goal: Identify and participate in supportive program therapies  Outcome: Not Progressing     Problem: At Risk for Suicide AS EVIDENCED BY...  Goal: Patient will remain safe during hospitalization  Outcome: Progressing  Goal: Verbalizes understanding of medication, benefits, and side effects  Outcome: Progressing     Problem: Thought Disorder  Goal: Demonstrates orientation to person place and time  Outcome: Progressing  Goal: Verbalizes reduction in hallucinations/delusions  Outcome: Progressing   [name] observed isolated to room resting in bed.  He was calm and cooperative upon approach.  Pt guarded with flat affect, anxious and depressed mood.  He maintained good eye contact during assessment.  Pt is A&Ox 4 with coherent thought process.  Pt stated that he is "not so good", "he is still detox ing"He endorses passive SI reporting thoughts of "using knifes to kill himself" but verbalizes that he is able to remain safe during hospital stay.  Pt denies HI, anxiety and depression but endorses AH reporting "negative voices telling him to kill himself" denies VH.  Pt did not attend groups today.  He is compliant with medication administration, mouth check completed.  Pt reports feeling pain to his knees and shoulders rating 10/10.  PRN Ibuprofen requested.  Pt observed resting in bed eating snacks and drinking juice.  He was able to answer assessment questions without difficulty.  Pt reports having a good appetite and bowel movement today.  [name] is able to make his needs known and encouraged to seek staff for support if needed.  Pt verbalized understanding.  Q 15 min observations and RN hourly rounding maintained for pt safety, will continue to monitor for changes in mood and behavior.  Support provided as needed.      PRN Ibuprofen 800mg  po given @  2208  for c/o pain to shoulders and knees 10/10.    PRN Vistaril 100mg  po given for sleep @ 2234    No acute events  overnight.  Pt observed resting quietly in bed with eyes closed for  7 hours @ 0600.    "I, [name], hereby attest that I have been made aware of and reviewed the following for this patient:     - Biopsychosocial assessment;  - Diagnoses;  - Nursing needs and medical support, if applicable; and  - Treatment Plan Goals, Objectives and Strategies/Interventions.     I am involved in the care of this patient within my scope of practice and assigned tasks. I approve the treatment plan and this attestation is in lieu of a signature on the document."

## 2022-05-29 NOTE — Plan of Care (Addendum)
Problem: At Risk for Suicide AS EVIDENCED BY...  Goal: Patient will remain safe during hospitalization  Description: [name] will remain safe during hospitalization    Interventions:  1. Complete suicide risk assessment at admission, PRN, and prior to discharge  2. Initiate Suicide Alert Level safety interventions as indicated  3. Reassess suicidal ideation every 12 hrs  Outcome: Progressing  Goal: Suicide Alert Level Moderate  Description: Interventions:  1. Every 15 minute in person checks  2. Educate [name] to report increase in suicidal ideation or concerns about safety  3. Reassess suicidal ideation every 12 hrs  4. Complete in-hospital safety plan  5. Order safety tray for meals as indicated   6. Room search for dangerous items per LIP order as indicated  7. Mouth checks following medication administration as indicated  8. Monitor use of bathroom as indicated  9. Assign room close to team station as indicated       Outcome: Progressing     Problem: Thought Disorder  Goal: Demonstrates orientation to person place and time  Description: Demonstrates orientation to person place and time each day during assessment and interaction    Interventions:  1. Monitor [name]'s orientation status every shift   2. Re-orient the  [name] to environment as needed  Outcome: Progressing  Goal: Conducts goal directed, coherent conversation  Description: [name] will Conducts goal directed, coherent conversation each day with treatment team by 05/30/22    Interventions:  1. Correct errors of perception in a matter of fact manner   2. Redirect conversations in a calm supportive manner   3. Assess [name]'s conversation every shift  Outcome: Progressing     Problem: Safety  Goal: Patient will be free from injury during hospitalization  Description: [name] will be free from injury during hospitalization      Interventions:  1. Assess [name]'s risk for falls and implement fall prevention plan of care per policy      2. Provide and  maintain safe environment   3. Use appropriate transfer methods   4. Ensure appropriate safety devices are available at the bedside   5. Include [name]/ family/ care giver in decisions related to safety   6. Hourly rounding   7. Assess for vincents risk for elopement and implement Elopement Risk Plan per policy   8. Provide alternative method of communication if needed (communication boards, writing)  Outcome: Progressing     [name] is alert and oriented x 4, calm, cooperative, but guarded, mood is "anxious and depressed", affect flat, constricted, speech is clear, thought process coherent, thought content- continues to endorse passive SI- no plan or intent, and he states "it's on/off", reports command AH "Demons' voices tell me to kill myself", denies SIB urges, HI, and VH, rates anxiety and depression 6/10, bilateral knee pain 8/10. Hygiene and grooming fair, reports appetite and sleep "are improving", writer was not able to assess gait, [name] remained in bed, denies elimination issues. He is isolative, complied with medications, he requested and received Vistaril 100 mg and Ibuprofen 800 mg. Encouraged to verbalize his feelings to staff at any time, encouraged to identify and use positive coping skills to help him stay grounded, and to make an effort to attend groups. [name] is able to make his needs known. Continue to monitor behvaior and safety q15 minutes, and provide support.    Sleep: 7.5 hrs, in no apparent distress.    "I, [name], hereby attest that I have been made aware of and reviewed the following  for this patient:    - Biopsychosocial assessment;  - Diagnoses;  - Nursing needs and medical support, if applicable; and  - Treatment Plan Goals, Objectives and Strategies/Interventions.    I am involved in the care of this patient within my scope of practice and assigned tasks. I approve the treatment plan and this attestation is in lieu of a signature on the document."

## 2022-05-29 NOTE — Treatment Plan (Cosign Needed)
Interdisciplinary Treatment Plan Update Meeting    05/29/2022  Karl Bales Kiedrowski    Participants:  Patient:  [name]  Attending Physician:  Jeri Cos, MD  RN: Lynnette Caffey    Short Term Goal: To work on going to groups, adjusting medication to target depression and anxiety.   Long Term Goal: To maintain sobriety and go to aftercare.     Objective:  Review response to treatment, reassess needs/goals, update plan as indicated incorporating patient's strengths and stated needs, goals, and preferences.      1. Summary of Patient Progress on Treatment Plan Goals:  Paton was present at his treatment plan meeting today. He denies HI/VH but continues to endorse having passive suicidal ideations and negative command auditory hallucinations. Patient endorses anxiety due to the "negative voices." Patient's goals include going to groups and optimizing his medications to treat his anxiety and depression. The treatment plan is to continue with scheduled medications, increase olanzapine to 15mg  at bedtime, encourage group attendance and work on finding an appropriate aftercare placement with the team.      2. Level of Patient Involvement:  Actively engaged/contributing    3. Patient Understanding of Plan of Care:  Able to verbalize goals and interventions    4. Level of Agreement/Commitment to Plan of Care:  Agrees with and is committed to plan of care          Contributor Signatures:      MD_________________________________ Date___________________    SW_________________________________Date ___________________    RN _________________________________Date____________________    Patient_______________________________Date____________________    Other________________________________Date ___________________    Other________________________________Date ___________________    (This document is signed electronically by Probation officer and electronic co-signer.  Other participants sign a printed copy which is scanned into  the EMR)

## 2022-05-29 NOTE — Plan of Care (Signed)
Problem: At Risk for Suicide AS EVIDENCED BY...  Goal: Will identify long and short term goals based on individual needs and strengths  Description: [name] will identify 2 long and 2 short term goals based on individual needs and strengths by 06/04/22    Interventions:  1. Assist [name] to identify long term recovery goal  2. Assist [name] to identify short term goal(s) for treatment   3. Assist  [name] to define criteria for discharge  4. Engage [name] in developing initial treatment plan and signing acknowledgement     Outcome: Progressing  Flowsheets (Taken 05/29/2022 1020)  Pt will identify long and short term treatment goals based on individual needs and strengths: Assist to identify goals for treatment based on individual needs and strengths  Goal: Identifies triggers and protective strategies  Description: [name] will Identify 3 triggers and 3 protective strategies by 06/04/22    Interventions:  1. Assist [name] to identify events preceding or prompting suicidal ideation  2. Assist [name] to identify distracting activities, thoughts, or calming techniques  3. Assist [name] to identify personal support network    4. Assist [name] to identify professionals, or organizations to contact for help     Outcome: Progressing  Flowsheets (Taken 05/29/2022 1020)  Identifies triggers and protecive strategies: Assist to identify triggers to aggressive/violent thoughts     Patient is alert and oriented X4, denies HI/A/VH at this time but continues to endorse passive SI with no plan. Patient contracts for safety on the unit and verbalized understanding to alert staff if thoughts of self-harm increases. He continues to endorse a depressed mood and presents with a constricted affect. Patient reported increased anxiety in the morning, PRN administered hydroxyzine 50mg  @0853  and @1322  for increased anxiety to good effect. He reports adequate sleep and appetite at this time. He is cooperative and compliant with  medications, mostly isolative to his room in the morning, observed laying in bed. Patient c/o bilateral knee pain, PRN administered ibuprofen 800mg  @0853  for increased bilateral knee pain to good effect. Patient states his goal for today is to attend groups. Encouraged patient to continue eating meals/drinking fluids, attend groups and report any concerns to staff.

## 2022-05-29 NOTE — Plan of Care (Signed)
Problem: At Risk for Suicide AS EVIDENCED BY...  Goal: Patient will remain safe during hospitalization  Outcome: Progressing   SW met with PT. SW shared with PT that SW understands from the DR that he may want to go to rehab. PT asked if SW can help him to get into rehab in [place]. SW shared that he will need to attend groups as a facility will want to see group attendance since it's expected in rehab. PT stated he's detoxing now and is unable to go to groups. HE asked if staff can send him to [place] if he goes to rehab and SW shared that he should have Medicaid transport, so yes. PT stated he will either go to [place] or to [place] at [place]. SW shared with PT that SW will need an address in [place] if he's going to a residence. PT stated he will think about what he will do.

## 2022-05-29 NOTE — Progress Notes (Signed)
Psychiatry Progress Note  (Level 1 = Problem Focused, Level 2 = Expanded Problem Focused, Level 3 = Detailed)    Patient Name: [name]            Current Date/Time:  1/4/202412:52 PM  MRN:  [id number]                            Attending Physician: Jeri Cos, MD  DOB: [birth year]                                Gender: male                              I. History   Informants: patient, EMR   A. Chief Complaint or Reason for Admission  "I have PTSD and Bipolar and I have been off of my medications for two weeks. I have a history of cutting myself and I have been having feeling hopeless and suicidal"   Admitted for SI and CAH.    B.History of Present Illness: Interval History     (Symptoms and qualifiers:1 for Level 1, 2 for Level 2 and 3+ for Level 3)  [name] is a 62 year old domiciled, employed, single man with past medical hx of arthritis and past psychiatric hx of PTSD, bipolar I disorder, and polysubstance use who presented with worsening depression, and SI in the context of medication nonadherence, recent alcohol and cocaine use, and social isolation.       On interview today patient reports that he is still nauseous and detoxing.  No evidence of alcohol withdrawal on observation and vital signs wnl, however holds out hands to show momentary tremor, which dissipates when no longer discussing tremor.  Patient reports that his sleep improved last night, but still having CAH telling him to kill himself.  He reports also wanting to kill himself.  Has minimal scarring on arms that shows ?cutting in past, however not extensive.  Patient when asked directly reports that he feels paranoid towards people outside of the hallway, and worried they may try to hurt him.  Reports that his thoughts are racing.      Patient does not appear to be in any distress when discussing his paranoid thoughts or CAH.  Not responding to internal stimuli.      Patient is interested in pursuing rehab upon discharge.       C.Medical History  Review of Systems  (Level 1 is none, Level 2 is 1, and Level 3 is 2+)  A complete 14 point ROS was done and was negative except for:   Psychiatric: see HPI   Constitutional: fatigue   GI: nausea     D.Additional Past Psychiatric, Substance Use, Medical, Family and Social History  Psychiatric: No new information available as compared to previous encounter(s)  Substance Use: No new information available as compared to previous encounter(s)  Medical: No new information available as compared to previous encounter(s)  Family: No new information available as compared to previous encounter(s)  Social: No new information available as compared to previous encounter(s)    Medications:   Current Medications       Scheduled       Medication Ordered Dose/Rate, Route, Frequency Last Action    cephALEXin (KEFLEX) capsule 1,000 mg 1,000 mg, PO, TID Given, 1,000  mg at 01/04 0630    folic acid (FOLVITE) tablet 1 mg 1 mg, PO, Daily Given, 1 mg at 01/04 0853    multivitamin tablet 1 tablet 1 tablet, PO, Daily Given, 1 tablet at 01/04 0853    OLANZapine (ZyPREXA) tablet 15 mg 15 mg, PO, QHS Ordered    thiamine (VITAMIN B1) tablet 200 mg 200 mg, PO, Daily Given, 200 mg at 01/04 0853              PRN       Medication Ordered Dose/Rate, Route, Frequency Last Action    alum & mag hydroxide-simethicone (MAALOX PLUS) 200-200-20 mg/5 mL suspension 30 mL 30 mL, PO, Q6H PRN Ordered    benztropine (COGENTIN) tablet 1 mg (Or Linked Group #1) 1 mg, PO, Once PRN Ordered    benztropine mesylate (COGENTIN) injection 2 mg (Or Linked Group #1) 2 mg, IM, Once PRN Ordered    docusate sodium (COLACE) capsule 100 mg 100 mg, PO, BID PRN Ordered    hydrOXYzine (VISTARIL) capsule 100 mg 100 mg, PO, QHS PRN Given, 100 mg at 01/03 2237    hydrOXYzine (VISTARIL) capsule 50 mg 50 mg, PO, Q4H PRN Given, 50 mg at 01/04 0853    ibuprofen (ADVIL) tablet 800 mg 800 mg, PO, Q8H PRN Given, 800 mg at 01/04 0853    loperamide (IMODIUM) capsule 2 mg 2  mg, PO, Q3H PRN Ordered    OLANZapine (ZyPREXA) tablet 10 mg 10 mg, PO, BID PRN Given, 10 mg at 01/02 0840    ondansetron (ZOFRAN) tablet 4 mg 4 mg, PO, Q8H PRN Ordered                    II. Examination   Vital signs reviewed:   Blood pressure 103/62, pulse 74, temperature 97.7 F (36.5 C), temperature source Oral, resp. rate 17, SpO2 96 %.     Mental Status Exam  (Level 1 is 1-5, Level 2 is 6-8, Level 3 is 9+)  General appearance: Appears chronological age, adequate hygiene and grooming   Attitude/Behavior: Calm, Cooperative, Eye Contact is  Good, and Guarded  Motor: No abnormalities noted  Gait: Not observed  Muscle strength and tone: Not tested  Speech:  Spontaneous: Yes  Rate and Rhythm: Normal  Volume: Normal  Tone: Normal  Clarity: Yes  Mood: "depressed"   Affect:  Euthymic   Range: full  Stability: Stable  Appropriateness to thought content: No   Intensity: Normal  congruent  Thought Process:  Coherent: Yes  Logical:  Yes at times  Associations: Goal-directed  Thought Content:  Delusions:  Not elicited, reports that he is paranoid that people in hallway might try to kill him, but states that he is "paranoid"    Depressive Cognitions:  Hopeless, Helpless  Suicidal:  Suicidal thoughts with intent, but no clear plans   Homicidal:  No homicidal thoughts  Violent Thoughts:  No  Perceptions:  Dissociative Phenomena:  No  Hallucinations: Yes, command auditory telling him to kill himself   Insight: Fair, knows that CAH are caused by brain, and that he is "paranoid" and that people aren't trying to kill him   Judgment: Poor  Cognition:   Level of Consciousness: Intact  Orientation: Intact to self, place and time    Psychiatric / Cognitive Instruments: None    Pertinent Physical Exam: Not relevant to current chief complaint/reason for admission    Imaging / EKG / Labs: Labs in the last 24 hours  Results       ** No results found for the last 24 hours. **          EKG Results   Cardiology Results       None             III. Assessment and Plan (Medical Decision Making)     1. I certify that this patient continues to require inpatient hospitalization due to acute risk to self    2. Psychiatric Diagnoses  Bipolar 1 Disorder, current episode depressed, severe, with psychotic features  Rule out substance-induced mood disorder   PTSD  Polysubstance use disorder (alcohol, cocaine, benzodiazepines (?))  Rule out malingering (increasingly likely)     3.All diagnostic procedures completed since admission were reviewed. Past medical records reviewed. Coordination of care was discussed with inpatient team and as available with the outpatient team.    4. On this admission patient educated about and provided input into their treatment plan.  Patient understands potential risks and benefits of proposed treatment plan.     5.  Assessment / Impression  Patient is a 62 y.o. domiciled, employed, single male with PMH of arthritis psychiatric history of Bipolar I disorder, PTSD, and polysubstance use admitted for worsening depression and suicidal ideation in the context of medication non-adherence, social isolation, and inadequate access to care. Patient is admitted on a voluntary basis.     On evaluation today, patient endorses symptoms of bipolar depression including low mood for > 2 wks, insomnia (difficulty falling asleep and staying asleep; estimates that he sleeps 3-4 hours on average), no interest, feelings of guilt / worthlessness / hopeless / helplessness, poor energy, poor concentration, and recurrent suicidal ideation. He reports that he is "crashing from mania" and he endorses a historical pattern of grandiosity ("feeling like God"), increased goal-directed activity, decreased judgement, distractibility, need for less sleep, and pressured speech--with ongoing racing thoughts and anxiety. Furthermore, patient also endorses command auditory hallucinations of voices telling him "negative things" and instructing him to harm himself.  Taken altogether, patient symptoms are consistent with a current episode of bipolar depression with mixed features and with psychotic features. However, patient also endorses a history of polysubstance use and his UDS was + for cocaine, which may also be contributing to his endorsed symptomatology.      Chart review indicates an extensive history of recent admissions and ED presentations for similar mental health concerns, with records from the [place] [facility] indicating that patient's most recent hospitalization was from 05/15/2022-05/23/2022, which is inconsistent with patient's narrative. Nevertheless, patient endorses depression with hopelessness and suicidal ideation, and further diagnostic clarification and evaluation is thus warranted at this time.      05/29/22:   Patient reports ongoing depressed mood, amotivation, and remains isolative to room.  He feels as though he is withdrawing from alcohol, but no objective evidence, and vital signs wnl.  He appears to embellish symptoms in order to obtain benzos. Patient continues to endorse CAH telling him to kill himself as well as paranoia, however not spontaneously, and only when directly asked.  He also endorses SI with intent.  Patient is open to pursuing rehab for his polysubstance use disorder.     Suicide Risk Assessment   Suicide Risk Assessment performed? Yes: Suicide Thoughts / Behaviors: Yes  Current Plan: None  Access to firearm: No  Past suicide attempts: Yes  Diagnosis of MDD, BPAD, Schizophrenia, Cluster B PD, Anorexia, Substance Use: Yes, Bipolar Depression versus substance-induced  mood disorder.    Plan / Recommendations:    Biological Plan:  Medications:  Increase olanzapine to 15 mg qhs   Medical Work-up: None required at this time  Consults: None required at this time    Psychosocial Plan:  Individual therapy: Psycho-education and psychotherapy of the following modalities will continue to be provided during daily  visits:Supportive  Group and milieu therapies: daily per unit's schedule.  Continue with Social Work intervention provided for discharge planning including assistance with placement.    Plan for Family Involvement: Patient Refusing  Other Providers Contact Information and Dates Contacted: No Updates    Total Attending time spent 35 minutes (floor time) with more than 50 percent of time in direct patient contact, coordinating care and counseling.    Signed by: Farris Has, MD  05/29/2022  12:52 PM

## 2022-05-30 MED ORDER — FLUOXETINE HCL 20 MG PO CAPS
30.0000 mg | ORAL_CAPSULE | Freq: Every day | ORAL | Status: DC
Start: 2022-05-31 — End: 2022-05-31
  Filled 2022-05-30: qty 1

## 2022-05-30 MED ORDER — FLUOXETINE HCL 20 MG PO CAPS
30.0000 mg | ORAL_CAPSULE | Freq: Every day | ORAL | Status: DC
Start: 2022-05-31 — End: 2022-05-30

## 2022-05-30 MED ORDER — FLUOXETINE HCL 20 MG PO CAPS
40.0000 mg | ORAL_CAPSULE | Freq: Every day | ORAL | Status: DC
Start: 2022-06-01 — End: 2022-05-31

## 2022-05-30 NOTE — Progress Notes (Signed)
Psychiatry Progress Note  (Level 1 = Problem Focused, Level 2 = Expanded Problem Focused, Level 3 = Detailed)    Patient Name: [name]            Current Date/Time:  1/5/20243:47 PM  MRN:  [id number]                            Attending Physician: Farris Has, MD  DOB: [birth year]                                Gender: male                              I. History   Informants: patient, EMR   A. Chief Complaint or Reason for Admission  "I have PTSD and Bipolar and I have been off of my medications for two weeks. I have a history of cutting myself and I have been having feeling hopeless and suicidal"   Admitted for SI and CAH.    B.History of Present Illness: Interval History     (Symptoms and qualifiers:1 for Level 1, 2 for Level 2 and 3+ for Level 3)  [name] is a 62 year old domiciled, employed, single man with past medical hx of arthritis and past psychiatric hx of PTSD, bipolar I disorder, and polysubstance use who presented with worsening depression, and SI in the context of medication nonadherence, recent alcohol and cocaine use, and social isolation.       Mood - "meh"  States he is existing; passive SI  States he will be going home when [name]'d  He is the oldest of 5 kids; not close to family; they don't know he is in hospital; he says he doesn't want to keep bugging them  Appetite/ sleep good  Says he's an introvert; can't go to group bc PTSD  States he stopped taking his meds for 2 weeks b/c he lost them, left them on the train  Denies HI, VH  STates he hears voices telling him negative things; denies CAH; states they are both inside his head and outside of his head  Vistaril Zyprexa working well; denies SE      Does not mention to this Clinical research associate today that he is interested in pursuing rehab upon discharge.      C.Medical History  Review of Systems  (Level 1 is none, Level 2 is 1, and Level 3 is 2+)  A complete 14 point ROS was done and was negative except for:   Psychiatric: see HPI    Constitutional: fatigue   GI: Denies     D.Additional Past Psychiatric, Substance Use, Medical, Family and Social History  Psychiatric: No new information available as compared to previous encounter(s)  Substance Use: No new information available as compared to previous encounter(s)  Medical: No new information available as compared to previous encounter(s)  Family: No new information available as compared to previous encounter(s)  Social: No new information available as compared to previous encounter(s)    Medications:   Current Medications       Scheduled       Medication Ordered Dose/Rate, Route, Frequency Last Action    cephALEXin (KEFLEX) capsule 1,000 mg 1,000 mg, PO, TID Given, 1,000 mg at 01/05 1330    FLUoxetine (PROzac) capsule 30 mg 30 mg,  PO, Daily Ordered    FLUoxetine (PROzac) capsule 40 mg 40 mg, PO, Daily Ordered    folic acid (FOLVITE) tablet 1 mg 1 mg, PO, Daily Given, 1 mg at 01/05 0839    multivitamin tablet 1 tablet 1 tablet, PO, Daily Given, 1 tablet at 01/05 0839    OLANZapine (ZyPREXA) tablet 15 mg 15 mg, PO, QHS Given, 15 mg at 01/04 2133    thiamine (VITAMIN B1) tablet 200 mg 200 mg, PO, Daily Given, 200 mg at 01/05 0839              PRN       Medication Ordered Dose/Rate, Route, Frequency Last Action    alum & mag hydroxide-simethicone (MAALOX PLUS) 200-200-20 mg/5 mL suspension 30 mL 30 mL, PO, Q6H PRN Ordered    benztropine (COGENTIN) tablet 1 mg (Or Linked Group #1) 1 mg, PO, Once PRN Ordered    benztropine mesylate (COGENTIN) injection 2 mg (Or Linked Group #1) 2 mg, IM, Once PRN Ordered    docusate sodium (COLACE) capsule 100 mg 100 mg, PO, BID PRN Ordered    hydrOXYzine (VISTARIL) capsule 100 mg 100 mg, PO, QHS PRN Given, 100 mg at 01/04 2133    hydrOXYzine (VISTARIL) capsule 50 mg 50 mg, PO, Q4H PRN Given, 50 mg at 01/05 0843    ibuprofen (ADVIL) tablet 800 mg 800 mg, PO, Q8H PRN Given, 800 mg at 01/05 0843    loperamide (IMODIUM) capsule 2 mg 2 mg, PO, Q3H PRN Ordered     OLANZapine (ZyPREXA) tablet 10 mg 10 mg, PO, BID PRN Given, 10 mg at 01/02 0840    ondansetron (ZOFRAN) tablet 4 mg 4 mg, PO, Q8H PRN Ordered                    II. Examination   Vital signs reviewed:   Blood pressure 111/66, pulse 77, temperature 97.9 F (36.6 C), temperature source Oral, resp. rate 16, SpO2 96 %.     Mental Status Exam  (Level 1 is 1-5, Level 2 is 6-8, Level 3 is 9+)  General appearance: Appears chronological age, adequate hygiene and grooming; laying on his side in bed; wearing hospital provided attire  Attitude/Behavior: Calm, Cooperative, Eye Contact is Fair, and Guarded  Motor: No abnormalities noted  Gait: Not observed  Muscle strength and tone: Not tested  Speech:  Spontaneous: Yes  Rate and Rhythm: Normal  Volume: Normal  Tone: Normal  Clarity: Yes at times  Mood: "Meh"   Affect:  Range: Constricted  Stability: Stable  Appropriateness to thought content: No   Intensity: Normal  congruent  Thought Process:  Coherent: Yes  Logical:  Yes at times  Associations: Goal-directed  Thought Content:  Delusions:  Not elicited, reports that he is paranoid that people in hallway might try to kill him, but states that he is "paranoid"    Depressive Cognitions:  Hopeless, Helpless  Suicidal:  Suicidal thoughts with intent, but no clear plans; Passive SI  Homicidal:  No homicidal thoughts  Violent Thoughts:  No  Perceptions:  Dissociative Phenomena:  No  Hallucinations: Yes, denies command auditory telling him to kill himself; states they are saying negative things about him   Insight: Fair, knows that CAH are caused by brain, and that he is "paranoid" and that people aren't trying to kill him   Judgment: Poor  Cognition:   Level of Consciousness: Intact  Orientation: Intact to self, place and time  Psychiatric / Cognitive Instruments: None    Pertinent Physical Exam: Not relevant to current chief complaint/reason for admission    Imaging / EKG / Labs: Labs in the last 24 hours  Results       ** No  results found for the last 24 hours. **          EKG Results   Cardiology Results       None            III. Assessment and Plan (Medical Decision Making)     1. I certify that this patient continues to require inpatient hospitalization due to acute risk to self    2. Psychiatric Diagnoses  Bipolar 1 Disorder, current episode depressed, severe, with psychotic features  Rule out substance-induced mood disorder   PTSD  Polysubstance use disorder (alcohol, cocaine, benzodiazepines (?))  Rule out malingering (increasingly likely)     3.All diagnostic procedures completed since admission were reviewed. Past medical records reviewed. Coordination of care was discussed with inpatient team and as available with the outpatient team.    4. On this admission patient educated about and provided input into their treatment plan.  Patient understands potential risks and benefits of proposed treatment plan.     5.  Assessment / Impression  Patient is a 62 y.o. domiciled, employed, single male with PMH of arthritis psychiatric history of Bipolar I disorder, PTSD, and polysubstance use admitted for worsening depression and suicidal ideation in the context of medication non-adherence, social isolation, and inadequate access to care. Patient is admitted on a voluntary basis.     On evaluation today, patient endorses symptoms of bipolar depression including low mood for > 2 wks, insomnia (difficulty falling asleep and staying asleep; estimates that he sleeps 3-4 hours on average), no interest, feelings of guilt / worthlessness / hopeless / helplessness, poor energy, poor concentration, and recurrent suicidal ideation. He reports that he is "crashing from mania" and he endorses a historical pattern of grandiosity ("feeling like God"), increased goal-directed activity, decreased judgement, distractibility, need for less sleep, and pressured speech--with ongoing racing thoughts and anxiety. Furthermore, patient also endorses command  auditory hallucinations of voices telling him "negative things" and instructing him to harm himself. Taken altogether, patient symptoms are consistent with a current episode of bipolar depression with mixed features and with psychotic features. However, patient also endorses a history of polysubstance use and his UDS was + for cocaine, which may also be contributing to his endorsed symptomatology.      Chart review indicates an extensive history of recent admissions and ED presentations for similar mental health concerns, with records from the [place] [facility] indicating that patient's most recent hospitalization was from 05/15/2022-05/23/2022, which is inconsistent with patient's narrative. Nevertheless, patient endorses depression with hopelessness and suicidal ideation, and further diagnostic clarification and evaluation is thus warranted at this time.     Prefers to stay in his room; does not wish to go to groups; at home enjoys watching [employer] channel and listening to [employer] music; states no reasons to live. Passive SI    Suicide Risk Assessment   Suicide Risk Assessment performed? Yes: Suicide Thoughts / Behaviors: Yes  Current Plan: None  Access to firearm: No  Past suicide attempts: Yes  Diagnosis of MDD, BPAD, Schizophrenia, Cluster B PD, Anorexia, Substance Use: Yes, Bipolar Depression versus substance-induced mood disorder.    Plan / Recommendations:    Biological Plan:  Medications:  Increase olanzapine to 15  mg qhs   Zyprexa 15 mg po QHS  Prozac 30 mg today; increase to 40 mg tomorrow; already scheduled  PRN Vistaril 50 mg po Q4H    Medical Work-up: None required at this time  Consults: None required at this time    Psychosocial Plan:  Individual therapy: Psycho-education and psychotherapy of the following modalities will continue to be provided during daily visits:Supportive and Cognitive Behavioral Therapy  Group and milieu therapies: daily per unit's schedule.  Continue  with Social Work intervention provided for discharge planning including assistance with placement, case management referral, and follow-up psychiatric care.    Plan for Family Involvement: Patient Refusing  Other Providers Contact Information and Dates Contacted: No Updates    I personally saw and examined the patient and spent 35 minutes. Patient was also seen by Dr. Carrie Mew. The assessment and plan were discussed and formulated together and has been reviewed and updated as needed.      Signed by: [place] [facility] June C Dajia Gunnels, NP  05/30/2022  3:57 PM

## 2022-05-30 NOTE — Plan of Care (Signed)
Problem: At Risk for Suicide AS EVIDENCED BY...  Goal: Patient will remain safe during hospitalization  Outcome: Progressing   SW met with PT. He was laying in bed. Malodorous. Pt stated that on Monday, he would like to be cabbed to [employer]. HE believes he will stay in [place] for a day and then he will go to stay in a hotel in [place]. He stated if he wants to go to rehab, he will find a place to go to.         Called Pt's insurance    [place] [phone]   PT needs to call insurance to update them  [phone]  [email or web address]

## 2022-05-30 NOTE — Nursing Progress Note (Addendum)
[name]  62 y.o.  P631/P631.01  male  Active Hospital Problems    Diagnosis    Mania     Legal Status: Voluntary  Vitals:    05/29/22 1933   BP: 110/68   Pulse: 73   Resp: 16   Temp: 98.6 F (37 C)   SpO2: 97%     Blood Sugar Level: N/A  CIWA Score: N/A  COWS Score: N/A  Pain Score: 8/10  Pain Location: Knees  Falls Risk Score: - LOW  Falls Risk Reason: None  Affect: flat  Mood anxious and depressed  Hallucinations: Command auditory "demons' voices tell me to kill myself"  Suicidal: Passive SI- no plan or intent "on/off"  Suicide Alert Level: Routine monitoring  Homicidal: No homicidal thoughts  Sleep Quality: Difficulty falling asleep and Difficulty maintaining sleep  Sleep Hours: 7.5 hrs  Energy: Poor, unable to carry out most of his/her usual activities.  Appetite: normal  NPO Status: n/a  ADLs: Independent with all self care tasks.  Hygiene: Fair  Medication/Medication Compliance: accepted  [place] Medication: No    LAR/Meds Over Objection: No    Medication Side Effects: none  DVT Prophylaxis: None needed  Compliant with Blood draws/Labs: n/a  Compliant with Vitals: compliant  Elopement risk: No    Sexual aggression: No      Acute Events Overnight:    none

## 2022-05-30 NOTE — UM Notes (Signed)
[name]  [address]  [place], [place] [address]  NPI: [id number]  Tax ID: [id number]     Attending:  Dr. Graceann Congress, MD / NPI: [id number]      Inpatient CSR for 05/27/2022 [facility] admission    Legal Status:  Voluntary     1/4 Per chart note of MD Dr. Graceann Congress:  On interview today patient reports that he is still nauseous and detoxing.  No evidence of alcohol withdrawal on observation and vital signs wnl, however holds out hands to show momentary tremor, which dissipates when no longer discussing tremor.  Patient reports that his sleep improved last night, but still having CAH telling him to kill himself.  He reports also wanting to kill himself.  Has minimal scarring on arms that shows ?cutting in past, however not extensive.  Patient when asked directly reports that he feels paranoid towards people outside of the hallway, and worried they may try to hurt him.  Reports that his thoughts are racing.     Patient does not appear to be in any distress when discussing his paranoid thoughts or CAH.  Not responding to internal stimuli.     Patient is interested in pursuing rehab upon discharge.       1/4 Per Nursing note:  Pt stated that he is "not so good", "he is still detox ing"He endorses passive SI reporting thoughts of "using knifes to kill himself" but verbalizes that he is able to remain safe during hospital stay.  Pt denies HI, anxiety and depression but endorses AH reporting "negative voices telling him to kill himself" denies VH.       1/4 Per Nursing note:  Patient is alert and oriented X4, denies HI/A/VH at this time but continues to endorse passive SI with no plan.   He continues to endorse a depressed mood and presents with a constricted affect. Patient reported increased anxiety in the morning, PRN administered hydroxyzine 50mg  @0853  and @1322  for increased anxiety to good effect. He reports adequate sleep and appetite at this time.     1/4 Per Nursing note:  [name] is alert and oriented x 4, calm, cooperative, but guarded, mood is "anxious and depressed", affect flat, constricted, speech is clear, thought process coherent, thought content- continues to endorse passive SI- no plan or intent, and he states "it's on/off", reports command AH "Demons' voices tell me to kill myself", denies SIB urges, HI, and VH, rates anxiety and depression 6/10, bilateral knee pain 8/10. Hygiene and grooming fair, reports appetite and sleep "are improving"     1/4 VS:  T 97.7, HR 74, RR 17, bp 103.62, Pox 96%    1/4 Psychiatric Diagnoses per MD Dr. Graceann Congress:  F31.64 Bipolar disorder, current episode mixed, severe, with psychotic features    Rule out substance-induced mood disorder   PTSD  Polysubstance use disorder (alcohol, cocaine, benzodiazepines (?)  Rule out malingering (increasingly likely)      1/4 Mental Status Exam per MD Dr. Graceann Congress:  General appearance: Appears chronological age, adequate hygiene and grooming   Attitude/Behavior: Calm, Cooperative, Eye Contact is  Good, and Guarded  Motor: No abnormalities noted  Gait: Not observed  Muscle strength and tone: Not tested  Speech:  Spontaneous: Yes  Rate and Rhythm: Normal  Volume: Normal  Tone: Normal  Clarity: Yes  Mood: "depressed"   Affect:  Euthymic   Range: full  Stability: Stable  Appropriateness to thought content: No  Intensity: Normal  congruent  Thought Process:  Coherent: Yes  Logical:  Yes at times  Associations: Goal-directed  Thought Content:  Delusions:  Not elicited, reports that he is paranoid that people in hallway might try to kill him, but states that he is "paranoid"    Depressive Cognitions:  Hopeless, Helpless  Suicidal:  Suicidal thoughts with intent, but no clear plans   Homicidal:  No homicidal thoughts  Violent Thoughts:  No  Perceptions:  Dissociative Phenomena:  No  Hallucinations: Yes, command auditory telling him to kill himself   Insight: Fair, knows that CAH are caused by brain,  and that he is "paranoid" and that people aren't trying to kill him   Judgment: Poor  Cognition:  Level of Consciousness: Intact  Orientation: Intact to self, place and time    1/4 Assessment / Plan per MD Dr. Arlyss Repress:  Patient reports ongoing depressed mood, amotivation, and remains isolative to room.  He feels as though he is withdrawing from alcohol, but no objective evidence, and vital signs wnl.  He appears to embellish symptoms in order to obtain benzos. Patient continues to endorse CAH telling him to kill himself as well as paranoia, however not spontaneously, and only when directly asked.  He also endorses SI with intent.  Patient is open to pursuing rehab for his polysubstance use disorder.   Biological Plan:  Medications:  Increase olanzapine to 15 mg qhs      1/5 Per Nursing note:  Pain Score: 8/10  Pain Location: Knees  Affect: flat  Mood anxious and depressed  Hallucinations: Command auditory "demons' voices tell me to kill myself"  Suicidal: Passive SI- no plan or intent "on/off"  Sleep Quality: Difficulty falling asleep and Difficulty maintaining sleep  Sleep Hours: 7.5 hrs  Energy: Poor, unable to carry out most of his/her usual activities.  Appetite: normal  ADLs: Independent with all self care tasks.  Hygiene: Fair    1/5 Presenting Mental Status per Nursing:  Behavior: restless  Perception: hallucinations  Hallucinations: auditory   Attitude: guarded   Mood: depressed, anxious   Affect: flat   Insight: fair   Judgment: fair   Appearance: unkempt   Appetite: increased  Energy: decreased    1/4-1/5 Scheduled and PRN Psych Meds:  Prozac 20mg  po daily, start 1/4-->30mg  po daily (1/6)-->40mg  po daily (1/7)  Zyprexa 5mg  po qHS-->10mg  po qHS (1/3)-->15mg  po qHS (1/4)  Vistaril 100mg  po qHS prn (1 dose on 1/3 & 1 dose on 1/4)  Vistaril 50mg  po q4hr prn (rec'd 2 doses on 1/4 & 1 dose on 1/5)  Ativan 1-4mg  po q1hr prn (rec'd 1mg  dose x1 on 1/3), [place]restline'd 1/3     1/4-1/5 Treatment plan:  Activity as  tolerated  Close monitoring q 78min checks  CIWA  VS bid  Medication management and stabilization  Group/individual therapy  Therapeutic Recreation eval and treat  D/c planning                  Joneen Caraway, BSN, RN, [employer], ACM-RN          Utilization Review   [facility]  [address]  [place], [place] [address]  Phone: [phone] (VM)  [place]: [phone]  Fax: [phone]

## 2022-05-30 NOTE — Plan of Care (Signed)
Problem: At Risk for Suicide AS EVIDENCED BY...  Goal: Identifies triggers and protective strategies  Description: [name] will Identify 3 triggers and 3 protective strategies by 06/04/22    Interventions:  1. Assist [name] to identify events preceding or prompting suicidal ideation  2. Assist [name] to identify distracting activities, thoughts, or calming techniques  3. Assist [name] to identify personal support network    4. Assist [name] to identify professionals, or organizations to contact for help     Outcome: Progressing  Flowsheets (Taken 05/30/2022 1453)  Identifies triggers and protecive strategies: Assist to identify triggers to aggressive/violent thoughts     Problem: Mood Disorder  Goal: Reports improved mood  Description: [name] reports improved mood by 06/04/22    Interventions:  1. Encourage the [name] to verbalize feelings of anxiety, anger, and fears   2. Frequent brief 1:1 conversations with the [name] to assess mood /coping response  Outcome: Progressing  Flowsheets (Taken 05/30/2022 1453)  Reports improved mood: Encourage the patient to verbalize feelings of anxiety, anger, and fears     Patient is alert and oriented X4, denies HI/VH at this time but continues to endorse passive SI with no plan. Patient contracts for safety on the unit and verbalized understanding to alert staff if thoughts of self-harm increases. Patient states that the voices are continuing to decrease at this time. He continues to endorse a depressed mood and presents with a constricted affect. Patient reported increased anxiety in the morning, PRN administered hydroxyzine 50mg  @0843  for increased anxiety to good effect. He reports adequate sleep and appetite at this time. He is cooperative and compliant with medications, mostly isolative to his room in the morning, observed laying in bed. Patient c/o bilateral knee pain, PRN administered ibuprofen 800mg  @0843  for increased bilateral knee pain to good effect. Patient  states his goal for today is to attend groups. Patient continued to be mostly isolative to himself in his room despite heavy encouragement by staff to attend groups. Encouraged patient to continue eating meals/drinking fluids, attend groups and report any concerns to staff.

## 2022-05-31 MED ORDER — IBUPROFEN 400 MG PO TABS
800.0000 mg | ORAL_TABLET | Freq: Three times a day (TID) | ORAL | Status: DC | PRN
Start: 2022-05-31 — End: 2022-06-10
  Administered 2022-05-31 – 2022-06-10 (×18): 800 mg via ORAL
  Filled 2022-05-31 (×18): qty 2

## 2022-05-31 MED ORDER — LURASIDONE HCL 20 MG PO TABS
20.0000 mg | ORAL_TABLET | Freq: Every day | ORAL | Status: DC
Start: 2022-05-31 — End: 2022-06-02
  Administered 2022-05-31 – 2022-06-01 (×2): 20 mg via ORAL
  Filled 2022-05-31 (×4): qty 1

## 2022-05-31 MED ORDER — ACETAMINOPHEN 325 MG PO TABS
650.0000 mg | ORAL_TABLET | Freq: Four times a day (QID) | ORAL | Status: DC | PRN
Start: 2022-05-31 — End: 2022-06-11
  Administered 2022-05-31 – 2022-06-10 (×16): 650 mg via ORAL
  Filled 2022-05-31 (×20): qty 2

## 2022-05-31 NOTE — Group Note (Signed)
in creative therapy, pt engaged in artwork at times and conversed often with peers.

## 2022-05-31 NOTE — Group Note (Signed)
In wellness group on WRAP packet, pt wrote some responses on pages of packet, sharing some of them in discussion, demonstrating fair understanding of his triggers and warning signs and how to intervene in these cases.

## 2022-05-31 NOTE — Plan of Care (Addendum)
Problem: At Risk for Suicide AS EVIDENCED BY...  Goal: Patient will remain safe during hospitalization  Description: Nathanael will remain safe during hospitalization    Interventions:  1. Complete suicide risk assessment at admission, PRN, and prior to discharge  2. Initiate Suicide Alert Level safety interventions as indicated  3. Reassess suicidal ideation every 12 hrs  Outcome: Progressing       Pt in bed when received, alert, oriented x 4, calm and cooperative, denies HI/AVH, endorses passive SI with no intent, presents with constricted affect, depressed and anxious mood, verbalizes needs to staff, isolative to room, adherent to scheduled medication except Olanzapine, he reports 6/10 anxiety and 6/10  depression, pt stated he does not want to take it because it makes him nauseous, pt requested Vistaril for anxiety and Ibuprofen for 6/10 knee pain. Pt had snacks in bedroom. Pt encouraged to seek assistance when needed. Safety maintained. Continue to monitor.     Pt observed in bed, eyes closed, no apparent distress noted, slept for 8 hours.        "I, Adryel Wortmann, hereby attest that I have been made aware of and reviewed the following for this patient:    - Biopsychosocial assessment;  - Diagnoses;  - Nursing needs and medical support, if applicable; and  - Treatment Plan Goals, Objectives and Strategies/Interventions.    I am involved in the care of this patient within my scope of practice and assigned tasks. I approve the treatment plan and this attestation is in lieu of a signature on the document."

## 2022-05-31 NOTE — Plan of Care (Addendum)
Problem: At Risk for Suicide AS EVIDENCED BY...  Goal: Identify and participate in supportive program therapies  Description: Tel will identify and participate in 3 supportive program therapies each day by 06/04/22  Interventions:  1. Assist Bronson to target therapy groups that support individual's treatment goals  2. Assist to [facility]  identify personal skill development or activity goal  3. Encourage Jerone attendance and reinforce small successes in participation  Outcome: Progressing  Flowsheets (Taken 05/31/2022 1458)  Identify and participate in supportive program therapies: Encourage attendance and reinforce small successes in participation     Problem: Thought Disorder  Goal: Conducts goal directed, coherent conversation  Description: Whitten will Conducts goal directed, coherent conversation each day with treatment team by 05/30/22  Interventions:  1. Correct errors of perception in a matter of fact manner   2. Redirect conversations in a calm supportive manner   3. Assess Zuri's conversation every shift  Outcome: Progressing  Flowsheets (Taken 05/31/2022 1458)  Conducts goal directed, coherent conversation: Correct errors of perception in a matter of fact manner     Renaldo was assessed in his room this shift. Patient is alert and oriented x 4. Patient presents guarded with a depressed mood and flat affect. Patient reports his sleep last night was "better." Patient reports his appetite is "up and down" and that he's "not used to 3 meals/day." Patient reports he had a bowel movement this morning. Patient reports a "low" energy level. Patient declined scheduled medications this morning. Patient reports the prescribed Zyprexa and Prozac are making him nauseous and would like to discuss his medication regimen with his provider. Patient endorsed "arthritic pain," rating the pain a 5/10, with a 10 indicating the most severe. Patient requested and received 50 mg Vistaril PRN and Advil 800 mg PRN. Patient  endorses passive suicidal ideations, reporting the thoughts are "off and on." Latrail reports no plan or intent to harm himself and agrees to come to staff if he has urges to. Patient reports he feels safe on the unit. Patient currently denies homicidal ideations. Patient denies auditory and visual hallucinations. Patient rates his anxiety as a 10/10, indicating the most severe. Patient reports he's worried and stressed about his impending discharge. Patient reports he "hasn't been out of his room yet" and is looking forward to attending groups today. Patient praised for attending groups and spending time outside of his room. Patient is able to make his needs known and maintains safe behavior on the unit. Will continue with plan of care.    Patient refused all blood work during this shift. 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    I am involved in the care of this patient within my scope of practice and assigned tasks. I approve the treatment plan and this attestation is in lieu of a signature on the document."

## 2022-05-31 NOTE — Progress Notes (Signed)
Psychiatry Progress Note  (Level 1 = Problem Focused, Level 2 = Expanded Problem Focused, Level 3 = Detailed)    Patient Name: [name]            Current Date/Time:  1/6/202411:24 AM  MRN:  [id number]                            Attending Physician: Jeri Cos, MD  DOB: 1960/11/30                                Gender: male                              I. History   Informants: patient, EMR   A. Chief Complaint or Reason for Admission  "I have PTSD and Bipolar and I have been off of my medications for two weeks. I have a history of cutting myself and I have been having feeling hopeless and suicidal"   Admitted for SI and CAH.    B.History of Present Illness: Interval History     (Symptoms and qualifiers:1 for Level 1, 2 for Level 2 and 3+ for Level 3)  Mr. Stillings is a 62 year old domiciled, employed, single man with past medical hx of arthritis and past psychiatric hx of PTSD, bipolar I disorder, and polysubstance use who presented with worsening depression, and SI in the context of medication nonadherence, recent alcohol and cocaine use, and social isolation.       Not really ready to go; if he wants me to go; I'll leave  Taking meds since 62 yo  Treatment Resistant depression  Prozac/ Zyprexa making me sick  All of a sudden getting nauseous  Can't take prozac/ zyprexa again  [place] works for me; gotta eat with latuda   Mood - depressed  SI  Depressed I have to go through all   I believe in Harrison; savior  I struggle with my faith  I believe in heaven and hell  This is hell on earth  My perspective is nothing dies  You can't kill myself; but can't die it's up to [place] is painful  Edmonia Lynch makes you want to kill yourself  Edmonia Lynch takes your health, your wealth, your wellness  Satan's playground   Always hearing voices; negative ruminations; sometimes inside/ outside head  Meds can only do Juron Vorhees much  I'm 62 yo; I didn't think I'd ever live this long  I beg to die sometimes  I don't believe in free will  and choice  Discusses black history       C.Medical History  Review of Systems  (Level 1 is none, Level 2 is 1, and Level 3 is 2+)  A complete 14 point ROS was done and was negative except for:   Psychiatric: see HPI   Constitutional: fatigue   GI: Denies     D.Additional Past Psychiatric, Substance Use, Medical, Family and Social History  Psychiatric: No new information available as compared to previous encounter(s)  Substance Use: No new information available as compared to previous encounter(s)  Medical: No new information available as compared to previous encounter(s)  Family: No new information available as compared to previous encounter(s)  Social: No new information available as compared to previous encounter(s)    Medications:   Current Medications  Scheduled       Medication Ordered Dose/Rate, Route, Frequency Last Action    FLUoxetine (PROzac) capsule 30 mg 30 mg, PO, Daily Ordered    FLUoxetine (PROzac) capsule 40 mg 40 mg, PO, Daily Ordered    folic acid (FOLVITE) tablet 1 mg 1 mg, PO, Daily Given, 1 mg at 01/05 0839    multivitamin tablet 1 tablet 1 tablet, PO, Daily Given, 1 tablet at 01/05 0839    OLANZapine (ZyPREXA) tablet 15 mg 15 mg, PO, QHS Given, 15 mg at 01/04 2133    thiamine (VITAMIN B1) tablet 200 mg 200 mg, PO, Daily Given, 200 mg at 01/05 0839              PRN       Medication Ordered Dose/Rate, Route, Frequency Last Action    alum & mag hydroxide-simethicone (MAALOX PLUS) 200-200-20 mg/5 mL suspension 30 mL 30 mL, PO, Q6H PRN Ordered    benztropine (COGENTIN) tablet 1 mg (Or Linked Group #1) 1 mg, PO, Once PRN Ordered    benztropine mesylate (COGENTIN) injection 2 mg (Or Linked Group #1) 2 mg, IM, Once PRN Ordered    docusate sodium (COLACE) capsule 100 mg 100 mg, PO, BID PRN Ordered    hydrOXYzine (VISTARIL) capsule 100 mg 100 mg, PO, QHS PRN Given, 100 mg at 01/05 2126    hydrOXYzine (VISTARIL) capsule 50 mg 50 mg, PO, Q4H PRN Given, 50 mg at 01/06 1003    ibuprofen (ADVIL) tablet  800 mg 800 mg, PO, Q8H PRN Given, 800 mg at 01/06 1004    loperamide (IMODIUM) capsule 2 mg 2 mg, PO, Q3H PRN Ordered    OLANZapine (ZyPREXA) tablet 10 mg 10 mg, PO, BID PRN Given, 10 mg at 01/02 0840    ondansetron (ZOFRAN) tablet 4 mg 4 mg, PO, Q8H PRN Ordered                    II. Examination   Vital signs reviewed:   Blood pressure 113/72, pulse 76, temperature 97.7 F (36.5 C), temperature source Oral, resp. rate 17, SpO2 99 %.     Mental Status Exam  (Level 1 is 1-5, Level 2 is 6-8, Level 3 is 9+)  General appearance: Appears chronological age, adequate hygiene and grooming; sitting up  Attitude/Behavior: Calm, Cooperative, Eye Contact is good; very talkative  Motor: No abnormalities noted  Gait: Not observed  Muscle strength and tone: Not tested  Speech:  Spontaneous: Yes  Rate and Rhythm: Normal  Volume: Normal  Tone: Normal  Clarity: Yes at times  Mood: "Depressed"   Affect:  Range: Constricted  Stability: Stable  Appropriateness to thought content: No   Intensity: Normal  congruent  Thought Process:  Coherent: Yes  Logical:  Yes at times  Associations: Goal-directed  Thought Content:  Delusions:  Not elicited, reports that he is paranoid that people in hallway might try to kill him, but states that he is "paranoid"    Depressive Cognitions:  Hopeless, Helpless  Suicidal:  Suicidal thoughts with intent, but no clear plans; Passive SI  Homicidal:  No homicidal thoughts  Violent Thoughts:  No  Perceptions:  Dissociative Phenomena:  No  Hallucinations: Yes, denies command auditory telling him to kill himself; states they are saying negative things about him   Insight: Fair, knows that CAH are caused by brain, and that he is "paranoid" and that people aren't trying to kill him   Judgment: Poor  Cognition:  Level of Consciousness: Intact  Orientation: Intact to self, place and time    Psychiatric / Cognitive Instruments: None    Pertinent Physical Exam: Not relevant to current chief complaint/reason for  admission    Imaging / EKG / Labs: Labs in the last 24 hours  Results       ** No results found for the last 24 hours. **          EKG Results   Cardiology Results       None            III. Assessment and Plan (Medical Decision Making)     1. I certify that this patient continues to require inpatient hospitalization due to acute risk to self    2. Psychiatric Diagnoses  Bipolar 1 Disorder, current episode depressed, severe, with psychotic features  Rule out substance-induced mood disorder   PTSD  Polysubstance use disorder (alcohol, cocaine, benzodiazepines (?))  Rule out malingering (increasingly likely)     3.All diagnostic procedures completed since admission were reviewed. Past medical records reviewed. Coordination of care was discussed with inpatient team and as available with the outpatient team.    4. On this admission patient educated about and provided input into their treatment plan.  Patient understands potential risks and benefits of proposed treatment plan.     5.  Assessment / Impression  Patient is a 62 y.o. domiciled, employed, single male with PMH of arthritis psychiatric history of Bipolar I disorder, PTSD, and polysubstance use admitted for worsening depression and suicidal ideation in the context of medication non-adherence, social isolation, and inadequate access to care. Patient is admitted on a voluntary basis.     On evaluation today, patient endorses symptoms of bipolar depression including low mood for > 2 wks, insomnia (difficulty falling asleep and staying asleep; estimates that he sleeps 3-4 hours on average), no interest, feelings of guilt / worthlessness / hopeless / helplessness, poor energy, poor concentration, and recurrent suicidal ideation. He reports that he is "crashing from mania" and he endorses a historical pattern of grandiosity ("feeling like God"), increased goal-directed activity, decreased judgement, distractibility, need for less sleep, and pressured speech--with  ongoing racing thoughts and anxiety. Furthermore, patient also endorses command auditory hallucinations of voices telling him "negative things" and instructing him to harm himself. Taken altogether, patient symptoms are consistent with a current episode of bipolar depression with mixed features and with psychotic features. However, patient also endorses a history of polysubstance use and his UDS was + for cocaine, which may also be contributing to his endorsed symptomatology.      Stopped Prozac/ Zyprexa due to SE; Started Latuda      Suicide Risk Assessment   Suicide Risk Assessment performed? Yes: Suicide Thoughts / Behaviors: Yes  Current Plan: None  Access to firearm: No  Past suicide attempts: Yes  Diagnosis of MDD, BPAD, Schizophrenia, Cluster B PD, Anorexia, Substance Use: Yes, Bipolar Depression versus substance-induced mood disorder.    Plan / Recommendations:    Biological Plan:  Medications:   Start Latuda 20 mg po with 350 cal at dinner; states he took it before; no SE; it worked  Stop Zyprexa 15 mg po QHS  Stop Prozac 30 mg today; increase to 40 mg tomorrow; already scheduled  PRN Vistaril 50 mg po Q4H; Zofran PRN    Medical Work-up: None required at this time  Consults: None required at this time    Psychosocial Plan:  Individual therapy: Psycho-education and psychotherapy  of the following modalities will continue to be provided during daily visits:Supportive and Cognitive Behavioral Therapy  Group and milieu therapies: daily per unit's schedule.  Continue with Social Work intervention provided for discharge planning including assistance with placement, case management referral, and follow-up psychiatric care.    Plan for Family Involvement: Patient Refusing  Other Providers Contact Information and Dates Contacted: No Updates    A total of 35 minutes was spent on this visit reviewing previous notes, counseling the patient and/or family members regarding the patient's condition(s), ordering of tests,  managing medications, and documenting the findings in the note.         Signed by: [facility] June C Deundre Thong, NP  05/31/2022  11:56 AM

## 2022-06-01 DIAGNOSIS — F3113 Bipolar disorder, current episode manic without psychotic features, severe: Secondary | ICD-10-CM

## 2022-06-01 DIAGNOSIS — F1990 Other psychoactive substance use, unspecified, uncomplicated: Secondary | ICD-10-CM

## 2022-06-01 DIAGNOSIS — Z765 Malingerer [conscious simulation]: Secondary | ICD-10-CM

## 2022-06-01 NOTE — Group Note (Signed)
In creative therapy, pt conversed with peers often and minimally engaged in artwork.

## 2022-06-01 NOTE — Group Note (Signed)
In goals/orientation group, pt reported feeling a sense of progress from tx at unit, stated daily goal to attend groups, and reported looking forward to discharging and engaging in a program in community. He participated a fairly high amount and appropriately in other activities of session.

## 2022-06-01 NOTE — Progress Notes (Signed)
Psychiatry Progress Note  (Level 1 = Problem Focused, Level 2 = Expanded Problem Focused, Level 3 = Detailed)    Patient Name: [name]            Current Date/Time:  1/7/20244:14 PM  MRN:  [id number]                            Attending Physician: Farris Has, MD  DOB: [birth year]                                Gender: male                              I. History   Informants: patient, chart review, clinical staff  A. Chief Complaint or Reason for Admission  "I have PTSD and Bipolar and I have been off of my medications for two weeks. I have a history of cutting myself and I have been having feeling hopeless and suicidal"   Admitted for SI and CAH.     B.History of Present Illness: Interval History     (Symptoms and qualifiers:1 for Level 1, 2 for Level 2 and 3+ for Level 3)  [name] is a 62 year old domiciled, employed, single man with past medical hx of arthritis and past psychiatric hx of PTSD, bipolar I disorder, and polysubstance use who presented with worsening depression, and SI in the context of medication nonadherence, recent alcohol and cocaine use, and social isolation.        INTERVAL HISTORY 06/01/22:  The patient, [name], reports a history of chronic depression and longstanding mental health issues. He denies any suicidal thoughts or self-harm. He expresses hope. [name] states that he finds group therapy helpful and is compliant with his medications, including Latuda, with no reported side effects. He reports good sleep and appetite, with no nausea today.    [name] tense and anxious during the visit, expressing feelings of safety in his current environment but uncertainty about the community. He is interested in the [facility] in [place] and has been speaking with social workers about his desire to be transferred there after discharge. The patient is encouraged to attend group therapy and request anxiety medication when needed.    C.Medical History  Review of  Systems  (Level 1 is none, Level 2 is 1, and Level 3 is 2+)  A complete 14 point ROS was done and was negative except for:   Psychiatric: see HPI   Constitutional: fatigue   GI: Denies      D.Additional Past Psychiatric, Substance Use, Medical, Family and Social History  Psychiatric: No new information available as compared to previous encounter(s)  Substance Use: No new information available as compared to previous encounter(s)  Medical: No new information available as compared to previous encounter(s)  Family: No new information available as compared to previous encounter(s)  Social: No new information available as compared to previous encounter(s)    Medications:   Current Medications       Scheduled       Medication Ordered Dose/Rate, Route, Frequency Last Action    folic acid (FOLVITE) tablet 1 mg 1 mg, PO, Daily Given, 1 mg at 01/05 0839    lurasidone (LATUDA) tablet 20 mg 20 mg, PO, Daily after dinner Given, 20 mg at 01/06 1755  multivitamin tablet 1 tablet 1 tablet, PO, Daily Given, 1 tablet at 01/05 0839    thiamine (VITAMIN B1) tablet 200 mg 200 mg, PO, Daily Given, 200 mg at 01/05 0839              PRN       Medication Ordered Dose/Rate, Route, Frequency Last Action    acetaminophen (TYLENOL) tablet 650 mg 650 mg, PO, Q6H PRN Given, 650 mg at 01/07 1446    alum & mag hydroxide-simethicone (MAALOX PLUS) 200-200-20 mg/5 mL suspension 30 mL 30 mL, PO, Q6H PRN Ordered    benztropine (COGENTIN) tablet 1 mg (Or Linked Group #1) 1 mg, PO, Once PRN Ordered    benztropine mesylate (COGENTIN) injection 2 mg (Or Linked Group #1) 2 mg, IM, Once PRN Ordered    docusate sodium (COLACE) capsule 100 mg 100 mg, PO, BID PRN Ordered    hydrOXYzine (VISTARIL) capsule 100 mg 100 mg, PO, QHS PRN Given, 100 mg at 01/06 2123    hydrOXYzine (VISTARIL) capsule 50 mg 50 mg, PO, Q4H PRN Given, 50 mg at 01/07 0858    ibuprofen (ADVIL) tablet 800 mg 800 mg, PO, Q8H PRN Given, 800 mg at 01/07 0907    loperamide (IMODIUM) capsule 2 mg  2 mg, PO, Q3H PRN Ordered    OLANZapine (ZyPREXA) tablet 10 mg 10 mg, PO, BID PRN Given, 10 mg at 01/02 0840    ondansetron (ZOFRAN) tablet 4 mg 4 mg, PO, Q8H PRN Given, 4 mg at 01/06 1150                    II. Examination   Vital signs reviewed:   Blood pressure 110/69, pulse 88, temperature 97.7 F (36.5 C), temperature source Oral, resp. rate 18, SpO2 98 %.     Mental Status Exam  (Level 1 is 1-5, Level 2 is 6-8, Level 3 is 9+)  General appearance: [name] chronological age, adequate hygiene and grooming; sitting up  Attitude/Behavior: Calm, Cooperative, Eye Contact is good; very talkative  Motor: No abnormalities noted  Gait: Not observed  Muscle strength and tone: Not tested  Speech:  Spontaneous: Yes  Rate and Rhythm: Normal  Volume: Normal  Tone: Normal  Clarity: Yes at times  Mood: "Depressed"   Affect:  Range: Constricted  Stability: Stable  Appropriateness to thought content: No   Intensity: Normal  congruent  Thought Process:  Coherent: Yes  Logical:  Yes at times  Associations: Goal-directed  Thought Content:  Delusions:  Not elicited, reports that he is paranoid that people in hallway might try to kill him, but states that he is "paranoid"    Depressive Cognitions:  Hopeless, Helpless  Suicidal:  Suicidal thoughts with intent, but no clear plans; Passive SI  Homicidal:  No homicidal thoughts  Violent Thoughts:  No  Perceptions:  Dissociative Phenomena:  No  Hallucinations: Yes, denies command auditory telling him to kill himself; states they are saying negative things about him   Insight: Fair, knows that CAH are caused by brain, and that he is "paranoid" and that people aren't trying to kill him   Judgment: Poor  Cognition:   Level of Consciousness: Intact  Orientation: Intact to self, place and time     Psychiatric / Cognitive Instruments: None     Pertinent Physical Exam: Not relevant to current chief complaint/reason for admission    Imaging / EKG / Labs: Labs in the last 24 hours  Results        **  No results found for the last 24 hours. **          III. Assessment and Plan (Medical Decision Making)     1. I certify that this patient continues to require inpatient hospitalization due to acute risk to self    2. Psychiatric Diagnoses  Bipolar 1 Disorder, current episode depressed, severe, with psychotic features  Rule out substance-induced mood disorder   PTSD  Polysubstance use disorder (alcohol, cocaine, benzodiazepines (?))  Rule out malingering (increasingly likely)      3.All diagnostic procedures completed since admission were reviewed. Past medical records reviewed. Coordination of care was discussed with inpatient team and as available with the outpatient team.     4. On this admission patient educated about and provided input into their treatment plan.  Patient understands potential risks and benefits of proposed treatment plan.      5.  Assessment / Impression  Patient is a 62 y.o. domiciled, employed, single male with PMH of arthritis psychiatric history of Bipolar I disorder, PTSD, and polysubstance use admitted for worsening depression and suicidal ideation in the context of medication non-adherence, social isolation, and inadequate access to care. Patient is admitted on a voluntary basis.     On evaluation today, patient endorses symptoms of bipolar depression including low mood for > 2 wks, insomnia (difficulty falling asleep and staying asleep; estimates that he sleeps 3-4 hours on average), no interest, feelings of guilt / worthlessness / hopeless / helplessness, poor energy, poor concentration, and recurrent suicidal ideation. He reports that he is "crashing from mania" and he endorses a historical pattern of grandiosity ("feeling like God"), increased goal-directed activity, decreased judgement, distractibility, need for less sleep, and pressured speech--with ongoing racing thoughts and anxiety. Furthermore, patient also endorses command auditory hallucinations of voices telling him  "negative things" and instructing him to harm himself. Taken altogether, patient symptoms are consistent with a current episode of bipolar depression with mixed features and with psychotic features. However, patient also endorses a history of polysubstance use and his UDS was + for cocaine, which may also be contributing to his endorsed symptomatology.      Stopped Prozac/ Zyprexa due to SE; Started Latuda.     06/01/22:  Patient is tolerating Latuda, he denies side effects. He expresses a desire to be referred to an outpatient program [facility] in [place]).        Suicide Risk Assessment   Suicide Risk Assessment performed? Yes: Suicide Thoughts / Behaviors: Yes  Current Plan: None  Access to firearm: No  Past suicide attempts: Yes  Diagnosis of MDD, BPAD, Schizophrenia, Cluster B PD, Anorexia, Substance Use: Yes, Bipolar Depression versus substance-induced mood disorder.     Plan / Recommendations:     Biological Plan:  Medications:   Continue Latuda 20 mg po with 350 cal at dinner; states he took it before; no SE; it worked  Stop Zyprexa 15 mg po QHS  Stop Prozac 30 mg today; increase to 40 mg tomorrow; already scheduled  PRN Vistaril 50 mg po Q4H; Zofran PRN     Medical Work-up: None required at this time  Consults: None required at this time     Psychosocial Plan:  Individual therapy: Psycho-education and psychotherapy of the following modalities will continue to be provided during daily visits:Supportive and Cognitive Behavioral Therapy  Group and milieu therapies: daily per unit's schedule.  Continue with Social Work intervention provided for discharge planning including assistance with placement, case management referral, and follow-up psychiatric  care.     Plan for Family Involvement: Patient Refusing  Other Providers Contact Information and Dates Contacted: No Updates    Total Attending time spent 35 minutes (floor time) with more than 50 percent of time in direct patient contact, coordinating care and  counseling.    Signed by: Rosine Beat, NP  06/01/2022  4:14 PM

## 2022-06-01 NOTE — Plan of Care (Deleted)
Pt was assessed in bed during the beginning of the shift. Pt is alert and oriented X4, Calm and cooperative, denies HI/AVH. Endorses passive SI with no intent, presents with a flat affect and reports being anxious to TW. Pt was visible in the milieu conversing with peers. Pt received Vistaril for anxiety and Ibuprofen and Tylenol for Knee pain. Pt was encouraged to ask for assistance whenever needed. Safety has been maintained. Will continue to monitor.

## 2022-06-01 NOTE — Plan of Care (Addendum)
Problem: At Risk for Suicide AS EVIDENCED BY...  Goal: Patient will remain safe during hospitalization  Description: Coe will remain safe during hospitalization    Interventions:  1. Complete suicide risk assessment at admission, PRN, and prior to discharge  2. Initiate Suicide Alert Level safety interventions as indicated  3. Reassess suicidal ideation every 12 hrs  Outcome: Progressing       Pt in bed when received, alert, oriented x 4, calm and cooperative, denies HI/AVH, endorses passive SI with no intent, presents with constricted affect, improved mood, verbalizes needs to staff, visible in the milieu, watching tv, pt stated he is less depressed and less anxious, pt requested Vistaril for anxiety and Ibuprofen for 6/10 knee pain.  Pt encouraged to seek assistance when needed. Safety maintained. Continue to monitor.    Pt observed in bed, eyes closed, no apparent distress noted, slept for 8 hours.

## 2022-06-01 NOTE — Plan of Care (Signed)
Problem: At Risk for Suicide AS EVIDENCED BY...  Goal: Will identify long and short term goals based on individual needs and strengths  06/01/2022 1816 by Ellina Sivertsen, Satira Anis, RN  Outcome: Progressing  Problem: At Risk for Suicide AS EVIDENCED BY...  Goal: Patient will remain safe during hospitalization  06/01/2022 1816 by Davis Vannatter, Satira Anis, RN  Outcome: Progressing     Pt was assessed in bed during the beginning of the shift. Pt is alert and oriented X4, Calm and cooperative, denies HI/AVH. Endorses passive SI with no intent, presents with a flat affect and reports being anxious to TW. Pt was visible in the milieu conversing with peers. Pt received Vistaril for anxiety and Ibuprofen and Tylenol for Knee pain. Pt was encouraged to ask for assistance whenever needed. Safety has been maintained. Will continue to monitor.

## 2022-06-01 NOTE — Group Note (Signed)
in wellness group on types of coping skills, depression psychoeducation, and coping skills for depression, pt made several comments in discussion that demonstrated fairly strong engagement with materials.

## 2022-06-01 NOTE — Group Note (Signed)
In group therapy, pt did not initiate discussions, but several times provided feedback on peers' comments. This feedback was usually supportive and positive in nature, at times referring to religious metaphors.

## 2022-06-02 MED ORDER — LURASIDONE HCL 40 MG PO TABS
40.0000 mg | ORAL_TABLET | Freq: Every day | ORAL | Status: DC
Start: 2022-06-02 — End: 2022-06-03
  Administered 2022-06-02: 40 mg via ORAL
  Filled 2022-06-02 (×2): qty 1

## 2022-06-02 MED ORDER — OLANZAPINE 5 MG PO TABS
5.0000 mg | ORAL_TABLET | Freq: Every evening | ORAL | Status: DC | PRN
Start: 2022-06-02 — End: 2022-06-10

## 2022-06-02 NOTE — UM Notes (Signed)
[facility]  [place], [place] [address]  NPI: [id number]  Tax ID: [id number]     Attending:  Dr. Arlyss Repress, MD / NPI: [id number]        Inpatient CSR for 05/27/2022 [facility] admission     Legal Status:  Voluntary     1/7 Per chart note of NP Neomia Glass:  The patient, [name], reports a history of chronic depression and longstanding mental health issues. He denies any suicidal thoughts or self-harm. He expresses hope. [name] states that he finds group therapy helpful and is compliant with his medications, including Latuda, with no reported side effects. He reports good sleep and appetite, with no nausea today.   [name] appears tense and anxious during the visit, expressing feelings of safety in his current environment but uncertainty about the community. He is interested in the [facility] in [place] and has been speaking with social workers about his desire to be transferred there after discharge. The patient is encouraged to attend group therapy and request anxiety medication when needed.     1/7 VS:  T 97.7, HR 88, RR 18, bp 110/69, POx 98%    1/7 Psychiatric Diagnoses per NP Neomia Glass:  F31.64 Bipolar disorder, current episode mixed, severe, with psychotic features    Rule out substance-induced mood disorder   PTSD  Polysubstance use disorder (alcohol, cocaine, benzodiazepines (?)  Rule out malingering (increasingly likely)      1/7 Mental Status Exam per NP Neomia Glass:  General appearance: Appears chronological age, adequate hygiene and grooming; sitting up  Attitude/Behavior: Calm, Cooperative, Eye Contact is good; very talkative  Motor: No abnormalities noted  Gait: Not observed  Muscle strength and tone: Not tested  Speech:  Spontaneous: Yes  Rate and Rhythm: Normal  Volume: Normal  Tone: Normal  Clarity: Yes at times  Mood: "Depressed"   Affect:  Range: Constricted  Stability: Stable  Appropriateness to thought content: No   Intensity:  Normal  congruent  Thought Process:  Coherent: Yes  Logical:  Yes at times  Associations: Goal-directed  Thought Content:  Delusions:  Not elicited, reports that he is paranoid that people in hallway might try to kill him, but states that he is "paranoid"    Depressive Cognitions:  Hopeless, Helpless  Suicidal:  Suicidal thoughts with intent, but no clear plans; Passive SI  Homicidal:  No homicidal thoughts  Violent Thoughts:  No  Perceptions:  Dissociative Phenomena:  No  Hallucinations: Yes, denies command auditory telling him to kill himself; states they are saying negative things about him   Insight: Fair, knows that CAH are caused by brain, and that he is "paranoid" and that people aren't trying to kill him   Judgment: Poor  Cognition:  Level of Consciousness: Intact  Orientation: Intact to self, place and time    1/7 Assessment / Plan per NP Neomia Glass:  Patient is tolerating Latuda, he denies side effects. He expresses a desire to be referred to an outpatient program Manufacturing [facility] in [place]).   Biological Plan:  Medications:   - Continue Latuda 20 mg po with 350 cal at dinner; states he took it before; no SE; it worked  - Stop Zyprexa 15 mg po QHS  - Stop Prozac 30 mg today; increase to 40 mg tomorrow; already scheduled  - PRN Vistaril 50 mg po Q4H; Zofran PRN      1/8 Per MD Dr. Arlyss Repress:  Pt is exhibiting manic symptoms, ongoing  SI, insomnia, pressured speech.  Adjusting medications.    1/8 Per Nursing note:  Pain Score: 3/10  Pain Location: left knee and right knee  Affect: Calm/Appropriate  Mood anxious  Hallucinations: Auditory, Visual  Depression Level: 0/10  Anxiety Level: 3/10  Sleep Quality:Difficulty falling asleep  Sleep Hours:9hrs  ADLs: Independent with all self care tasks.  Hygiene: disheveled    1/8 Per Nursing note:  He is alert and oriented x4. He endorsed auditory and visual hallucination. He denied SI/HI. He endorsed anxiety and depression. He endorsed knee pain and rated his  pain as 5/10( with 10 being the worse). Pt stated "I'm anxious of impending discharge which I feel its too early. I have couple of programs I need to attend before discharge".   Pt requested for PRN for pain and sleep aid . Tylenol and Vistaril was administered with effect.      1/8 Presenting Mental Status per Nursing:  Thought Content: WDL   Thought Process: WDL   Hallucinations: auditory   Attitude: guarded   Mood: depressed   Affect:  flat  Insight: fair   Judgment: fair   Appetite: increased   Energy: decreased    1/7-1/8 Scheduled and PRN Psych Meds:  Latuda 20mg  po daily after dinner, start 1/6-->40mg  po daily after dinner (1/8)  Prozac 40mg  po daily   Zyprexa 15mg  po qHS, Mercer'd 1/6  Vistaril 100mg  po qHS prn (rec'd 1 dose on 1/6 & 1 dose on 1/7)  Vistaril 50mg  po q4hr prn  (rec'd 1 dose on 1/6, 1 dose on 1/7 & 1 dose on 1/8)    1/7-1/8 Treatment plan:  Activity as tolerated  Close monitoring q checks  CIWA  VS bid  Medication management and stabilization  Group/individual therapy  Therapeutic Recreation eval and treat  D/c planning                    Rowan Blase, BSN, RN, [employer], ACM-RN          Utilization Review   [facility]  [address]  [address]  [place], [place] [address]  Phone: [phone] (VM)  [place]: [phone]  Fax: [phone]

## 2022-06-02 NOTE — Plan of Care (Signed)
Problem: At Risk for Suicide AS EVIDENCED BY...  Goal: Will identify long and short term goals based on individual needs and strengths  Outcome: Progressing  Goal: Patient will remain safe during hospitalization  Outcome: Progressing  Goal: Suicide Alert Level Moderate  Outcome: Progressing   Rollo was met in the milieu watching tv and socializing with peers. He is alert and oriented x4. He endorsed auditory and visual hallucination. He denied SI/HI. He endorsed anxiety and depression. He endorsed knee pain and rated his pain as 5/10( with 10 being the worse). Pt stated "I'm anxious of impending discharge which I feel its too early. I have couple of programs I need to attend before discharge". Pt was emotionally supported and encouraged to meet the social worker in the morning. He appears disorganized,disheveled,hyper verbal,pressured speech,anxious,depressed intact cognition. Pt requested for PRN for pain and sleep aid . Tylenol and Vistaril was administered with effect. Pt made no medication side effect report. He was encouraged to notify writer if he starts having thought of self harm or others. He agreed. He maintained safety behavior the rest of the shift. Will continue to monitor. He slept 9hrs.

## 2022-06-02 NOTE — Group Note (Signed)
Group: BH - Life Skills  Group Topics: WRAP  Group Date: 06/02/2022  Start Time: 1115  End Time: 1200  Number of Participants: 8  Facilitators: Franne Forts   Department: [facility] Professional Services Building [address]    Discipline Led: [place]  Number of Patient Participants: 8      Name: [name] Date of Birth: 03/02/1961   MR: [id number]      Level of Participation: active  Quality of Participation: engaged and offered feedback  Interactions with Others:  hyper verbal and gave feedback  Mood/Affect: bright and manic  Triggers (if applicable): N/A  Cognition: grandiose, pressured speech, religious preoccupation, and tangential  Organization: distractible  Progress: Moderate  Response: Patient described his mood as "grateful to be living". He set goal to go to groups. Patient worked on the written part of the WRAP and participated in the discussion. Patient was hyper verbal with rapid pressured speech. Thoughts are tangential at times.   Plan: patient will be encouraged to continue to attend groups.     Patients Problems:   Patient Active Problem List   Diagnosis    Alcohol abuse    Cocaine use    Balanitis    Suicidal ideation    Pyuria    Mood disorder    Bipolar disorder    Mania

## 2022-06-02 NOTE — Progress Notes (Signed)
[facility]   Recreational Therapy Assessment     Patient Name: Duncan Alejandro    MRN:  [id number]  Age: 62 y.o.  DOB: 08-28-1960   Unit: [place] [address]    Bed: P631/P631.01                 Assessment:   Order received for Brent Bulla for RT Assessment and Treatment. Assessment completed via chart review, team discussion and observations from participation in group. Patient's medical condition is appropriate for Recreation Therapy intervention at this time. Patient will benefit from Recreation Therapy services for improve coping behavior, improve general psychological health, improve self-control, improve social skills, socialization, cooperation and interpersonal interactions, increase self-concept and self-esteem, increase life and leisure satisfaction and perceived quality of life, and increase leisure awareness and knowledge of community resources. See assessment below for additional information.    Plan   Type of treatment: Group  RT frequency:  2-3x/ week    Educated the patient to role of recreation therapy, plan of care, goals of therapy and RT groups offered. The patient and Recreation Therapist engaged in plan of care development and goal setting. The patient agrees with both.     History of Present Illness:   Kolton Kienle is a 62 y.o. male admitted on 05/27/2022 with Bipolar 1 disorder [F31.9]  Mania [F30.9].     Per H&P--"Jacere Yobany Vroom is a 62 y.o. male domiciled, employed [place] American male with a medical history of arthritis of the knee and shoulder, UTI and a psychiatric history of PTSD, bipolar disorder, polysubstance abuse, presents to ED with worsening hallucination, "feeling manic, not taken medications in 2 weeks, binge drinking alcohol and seeking psychiatric evaluations.      On assessment today, patient is found laying in his bed.  He is alert and oriented x 4.  He reported generalized fatigue and symptoms of  withdrawal from alcohol.  He reported drinking up to 8 cans of beer a day and his last drink about 4 days ago.  Patient reported uncircumcised penis but he reprieved distally exposed to it UTI and reported 1 dose of antibiotics in the emergency room but he preferred to take p.o. medication to complete the course of treatment.  His UDS is positive for cocaine and benzos, patient denied cocaine use recently.  He denied SI/HI/AVH.".  Subjective   Pt is agreeable to participation in the therapy session.  "I can have very paranoid thoughts".  Objective   Malakie Balis is a 62 y.o. male admitted on 05/27/2022 with Bipolar 1 disorder [F31.9]  Mania [F30.9]. Patient's medical condition is appropriate for Recreational Therapy intervention at this time. Patient is in milieu with peers. Pt transition to RT session with independence.     Name of Group: Recreation Therapy  RT Intervention: Cognitive Distortions Team Activity  Purpose: Increase awareness related to cognitive distortions, improve social/communication skills and develop coping skills.  Patient progress towards goal:   Pt was agreeable to attend and transitioned to group independently. Pt was actively participating during review on topic of cognitive distortions. Group members were instructed to work together to complete a scenario-based task within specific time constraints identifying cognitive distortions.  Pt displayed high level of participation by working cooperatively with peers. Pt was able to identify cognitive distortions with peers and reframe the thoughts to more positive/neutral statements. Appropriate staff and peer interactions observed throughout session. Continued recreation therapy services are recommended to  improve social/communication skills, increase self-awareness and develop coping skills.    Group benefits: improve coping behavior, improve general psychological health, improve self-control, improve social skills, socialization,  cooperation and interpersonal interactions, increase self-concept and self-esteem, increase life and leisure satisfaction and perceived quality of life, and increase leisure awareness and knowledge of community resources    RT Group Participation:  attended entire session   independent  Appearance: age appropriate and in hospital gowns  Mood: euthymic and elevated  Affect: full range  Behavior: Cooperative, Distractable  Thought Content: appropriate for session  Thought Process: loose associations and tangential  Speech: normal volume and normal pitch  Attention Span: Attends to task with redirection  Memory: Appears intact  Following Commands: multi-step commands, independent, min verbal instruction  Problem solving and judgement: Good, Able to problem solve independently  Impulse control: Normal  Participation: excellent  Gait/movement: steady and coordinated  Goals:   RT goal:   Prior to d/c, pt will attend and participate in a min. of 75% of RT sessions.     Signed By: Nira Retort, Drue Flirt  [phone]

## 2022-06-02 NOTE — Nursing Progress Note (Signed)
[name] was A+Ox4 with flat affect and pressured speech. Denies SI/HI/AVH/SIB urges. BVC is 0. Pt states they are committed to maintaining their safety, encouraged to come to staff if thoughts or urges to harm self or others increases, [name] verbalizes understanding. Pt is cooperative with Probation officer, able to answer questions and maintain  eye contact. Pt describes their mood as anxious. Reported good sleep/energy/appetite.  [name] rated their depression as 5/10 and anxiety as 5/10 (with 10 being the highest possible value). [name] states their goal for today is to set up "out side program"prior to discharge.[name] is compliant with medication administration and mouth check completed. [name] was visible in the milieu throughout the shift, participated in groups with peers and staff appropriately. Q15 min safety checks maintained. Will continue to monitor and ensure safety.  PRN Motrin and Vistaril administered for pain/anxiety with relief.      "I, Janett Labella RN, hereby attest that I have been made aware of and reviewed the following for this patient:    - Biopsychosocial assessment;  - Diagnoses;  - Nursing needs and medical support, if applicable; and  - Treatment Plan Goals, Objectives and Strategies/Interventions.    I am involved in the care of this patient within my scope of practice and assigned tasks. I approve the treatment plan and this attestation is in lieu of a signature on the document."

## 2022-06-02 NOTE — Plan of Care (Addendum)
TW was in attendance during treatment team meeting on this date. Reportedly, patient is A&O x 4. Denies SI/HI/AVH. Pt's affect: blunt. Mood: anxious. Pt is compliant with medication regiment and group attendance. The discharge plan is scheduled for today. Pt will need Lyft transportation to [place]. Pt provided with [facility] resources in [place].
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
## 2022-06-02 NOTE — Progress Notes (Signed)
Karl Bales Mondo  62 y.o.  P631/P631.01  male  Active Hospital Problems    Diagnosis    Mania     Legal Status: Voluntary  Vitals:    06/01/22 1943   BP: 131/73   Pulse: 85   Resp: 18   Temp: 97.9 F (36.6 C)   SpO2: 97%     Blood Sugar Level: N/A  CIWA Score: N/A  COWS Score:N/A  Pain Score: 3/10  Pain Location: left knee and right knee  Falls Risk Score: - LOW  Falls Risk Reason: None  Affect: Calm/Appropriate  Mood anxious  Hallucinations: Auditory, Visual  Suicidal: No suicidal thoughts  Suicide Alert Level: Routine monitoring  Homicidal: No homicidal thoughts  Depression Level: 0/10  Anxiety Level: 3/10  Sleep Quality:Difficulty falling asleep  Sleep Hours:9hrs  Energy: Energy level is good.  The patient is able to perform most usual functions.  Appetite: normal  NPO Status: N/A  ADLs: Independent with all self care tasks.  Hygiene: disheveled  Date of Last Bowel Movement: today  Number of Groups Attended: 2  Medication/Medication Compliance: accepted  [place] Medication: No    LAR/Meds Over Objection: No    Medication Side Effects: none  DVT Prophylaxis: None needed  Compliant with Blood draws/Labs: compliant  Compliant with Vitals: compliant  Elopement risk: No    Sexual aggression: No      Acute Events Overnight:    NONE

## 2022-06-02 NOTE — Group Note (Signed)
BH - Art Therapy and Music Therapy collaboration group  Inpatient Behavioral Health Group Note    Patient Name:  [name]       Medical Record Number: [id number]   Date of Birth: May 11, 1961  Sex: Male          Rehab Diagnosis: Bipolar 1 disorder [F31.9];Mania [F30.9]     Today's Date: 06/02/2022   Group Start Time: 1300   Group End Time: 1400   Group Facilitators: Volney American  Group Topic: BH - Art Therapy  Group members were engaged with music and art therapy activity which aim to promote relaxation, creative exploration, mindfulness, problem solving and self-expression.     Patient's response to treatment:    Participation : Partial  Appearance: Neat  Mood: Euthymic   Affect: Full  Behavior: Cooperative, Attentive, and Sociable  Thought Content: Appropriate to the session  Thought Process: Linear/ Logical  Speech: Normal    [name] expressed interest in music. He was noticed to sing along, playing the music instrument and dance with the music. He also interact with peers in bright affect. By the end of the group, he was bright and joked that he needs "music pills."           Recommendation:  To continue art therapy engagement in order to promote  self- expression, stress relief, mindfulness, social skills, and establish positive coping skills for symptom management.      Signed by:   Audelia Acton, Shanoah Asbill Nickola Major , [place] Department

## 2022-06-02 NOTE — Group Note (Signed)
Group: [place] - Creative  Group Topics: Creative Therapy  Group Date: 06/02/2022  Start Time: 1600  End Time: 1700  Number of Participants: 10  Facilitators: Franne Forts   Department: [facility] Professional Services Building [address]    Discipline Led: [place]  Number of Patient Participants: 10          Name: [name] Date of Birth: 19-Apr-1961   MR: [id number]      Level of Participation: moderate  Quality of Participation: distracting to others, hyperactive, immature, pushed limits, and redirectable  Interactions with Others:  social  Mood/Affect: bright, manic, hyper verbal  Triggers (if applicable): N/A  Cognition: goal directed  Organization: distractible  Progress: Minimal  Response: Patient did not work on an Therapist, art. He danced and sang along with the music. He was hyper verbal and social with his peers. He was pulled out by a provider but returned to the group.   Plan: patient will be encouraged to continue to attend groups.    Patients Problems:   Patient Active Problem List   Diagnosis    Alcohol abuse    Cocaine use    Balanitis    Suicidal ideation    Pyuria    Mood disorder    Bipolar disorder    Mania
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
## 2022-06-03 MED ORDER — LORAZEPAM 1 MG PO TABS
1.0000 mg | ORAL_TABLET | Freq: Every evening | ORAL | Status: DC | PRN
Start: 2022-06-03 — End: 2022-06-03

## 2022-06-03 MED ORDER — LORAZEPAM 1 MG PO TABS
1.0000 mg | ORAL_TABLET | Freq: Every evening | ORAL | Status: DC
Start: 2022-06-03 — End: 2022-06-10
  Administered 2022-06-03 – 2022-06-09 (×7): 1 mg via ORAL
  Filled 2022-06-03 (×7): qty 1

## 2022-06-03 MED ORDER — NYSTATIN 100000 UNIT/GM EX POWD
Freq: Two times a day (BID) | CUTANEOUS | Status: DC
Start: 2022-06-03 — End: 2022-06-11
  Filled 2022-06-03: qty 15

## 2022-06-03 MED ORDER — LURASIDONE HCL 40 MG PO TABS
60.0000 mg | ORAL_TABLET | Freq: Every day | ORAL | Status: DC
Start: 2022-06-03 — End: 2022-06-04
  Administered 2022-06-03: 60 mg via ORAL
  Filled 2022-06-03 (×2): qty 1

## 2022-06-03 NOTE — Group Note (Signed)
Group: [facility] - Group Therapy   Group Topics: Patients were asked to come up with a topic to process with the group  Group Date: 06/03/2022  Start Time: 1330  End Time: 1430  Number of Participants: 9  Facilitators: Franne Forts   Department: [facility] [address] [place]    Discipline Led: [place]  Number of Patient Participants: 9          Name: [name] Date of Birth: Sep 28, 1960   MR: [id number]      Level of Participation:  left before talking about his topic  Quality of Participation: engaged, hyper verbal, hyper religious, manic but less so than yesterday and redirectable   Interactions with Others:  hyper verbal, , gave feedback, intrusive, and monopolizing  Mood/Affect: bright and manic  Triggers (if applicable): N/A  Cognition: goal directed, grandiose, and religious preoccupation  Organization: disorganized  Progress: Moderate, responding to firm limits and redirection   Response: Patient was going to talk about relationships but left before sharing.   Plan: patient will be encouraged to continue to attend groups    Patients Problems:   Patient Active Problem List   Diagnosis    Alcohol abuse    Cocaine use    Balanitis    Suicidal ideation    Pyuria    Mood disorder    Bipolar disorder    Mania

## 2022-06-03 NOTE — Progress Notes (Signed)
[facility]   Recreational Therapy Progress Note     Patient Name: [name]    MRN:  [id number]  Age: 62 y.o.  DOB: 16-Nov-1960   Unit: [place] [facility] [address]    Bed: P631/P631.01             Subjective   Pt is agreeable to participation in the therapy session.    "I want to connect with my peers".  Objective   [name] is a 62 y.o. male admitted on 05/27/2022 with Bipolar 1 disorder [F31.9]  Mania [F30.9].   Pt transition to RT session with independence.     Name of Group: Recreational Therapy   RT Intervention: Leisure Goals & Gardening   Purpose: Increase self-awareness, develop leisure skills and develop healthy coping mechanisms.   Patient progress towards goal:   Pt was agreeable to attending the group and transitioned independently to/from group session. Pt arrived to group late. Pt transitioned well to the leisure education discussion. TW demonstrated how to write a "SMART" goal and had group practice writing their own "SMART" goal related to leisure. When asked to ID a goal related to leisure that pt would like to accomplish, pt stated "go to group and connect with peers.". Pt transitioned to engage in leisure participation in the gardening task, decorating a cup, and planting a Marigold seed. RT asked pt's to visualize the seed as their intention and allow it to be a reminder every time they water the plant. Pt demonstrated understanding by establishing expectations and goals regarding leisure involvement. Pt left early to use restroom and did not return for remainder of group. Appropriate staff and peer interactions were observed throughout the session. Overall, pt participation was excellent.  Continued recreation therapy services are recommended to increase self-awareness, and leisure skills and develop healthy coping mechanisms.     Group benefits: improve coping behavior, improve general psychological health, improve self-control,  improve social skills, socialization, cooperation and interpersonal interactions, increase self-concept and self-esteem, increase life and leisure satisfaction and perceived quality of life, and increase leisure awareness and knowledge of community resources    RT Group Participation:  arrived late   independent  Appearance: age appropriate and in hospital gowns  Mood: euthymic and elevated  Affect: full range  Behavior: Cooperative, Distractable  Thought Content: appropriate for session  Thought Process: loose associations and tangential  Speech: normal volume and normal pitch  Attention Span: Attends to task with redirection  Memory: Appears intact  Following Commands: multi-step commands, independent, min verbal instruction  Problem solving and judgement: Good, Able to problem solve independently  Impulse control: Normal  Participation: excellent  Gait/movement: steady and coordinated  Goals:   RT goal:   Prior to d/c, pt will attend and participate in a min. of 75% of RT sessions.       Signed By: Selina Cooley, [place]

## 2022-06-03 NOTE — Progress Notes (Signed)
Psychiatry Progress Note  (Level 1 = Problem Focused, Level 2 = Expanded Problem Focused, Level 3 = Detailed)    Patient Name: [name]            Current Date/Time:  1/9/202410:09 AM  MRN:  [id number]                            Attending Physician: Farris Has, MD  DOB: 1960-09-20                                Gender: male                              I. History   Informants: patient, chart review, clinical staff  A. Chief Complaint or Reason for Admission  "I have PTSD and Bipolar and I have been off of my medications for two weeks. I have a history of cutting myself and I have been having feeling hopeless and suicidal"   Admitted for SI and CAH.     B.History of Present Illness: Interval History     (Symptoms and qualifiers:1 for Level 1, 2 for Level 2 and 3+ for Level 3)  [name] is a 62 year old domiciled, employed, single man with past medical hx of arthritis and past psychiatric hx of PTSD, bipolar I disorder, and polysubstance use who presented with worsening depression, and SI in the context of medication nonadherence, recent alcohol and cocaine use, and social isolation.        INTERVAL HISTORY 06/03/22:    Took Vistaril 3 tabs at 11 PM; slept until 4 AM; better than 2-3 AM  Reading Bible Koran; slowing down a little bit  Thoughts still racing  I catch myself in group talking too much  Mood - haven't cried this AM; see something on tV; something will say something  I'm 62 yo; do men get more feminine; women get more manly - mustache  [employer] channel; crying; catharsis  I wear dentures - I don't wear them; people fight in psych wards  Soul, spirit; kill the shell; you can't kill the soul  I just want to stop the pain; take drugs; escape the pain  Not want to sit there with the pain  Appetite - low; need to eat little stuff  Safe here; always thinking about suicide;  Always voices - oscillate and vacillate; saying same things; [name]; archetypes; I feel I'm really gifted and chosen;  Mom said he's [name] smart but he ain't got no common sense  Loneliness makes me want to die the most; no GF  I dont' have any friends;   NC - 3 sisters, brother and a mother  I've been told by [name] many doctors - I need to go home  I don't want my mom to control my money; she's a capitalist at heart  Can stay in a hotel first; family triggers me; they are toxic  I've been on every med; Risperdal - male growing tits; I'm concerned about my sexuality  Depakote - I can't take Lithium; can't stand blood levels; scare of needles  Lamictal - SJS; I got the rash;   Needle phobia; not tooting my own horn; I dropped out of school; I don't hate nobody  [employer]; my forefathers; Syphilis   I'm open, I'm  open;   Seroquel - I love it, it don't love me; vomitting  Abilify - I've done work with her; took it a long time ago; that one works for me  [name] - locked me up  Haldol - I'm open to them;             C.Medical History  Review of Systems  (Level 1 is none, Level 2 is 1, and Level 3 is 2+)  A complete 14 point ROS was done and was negative except for:   Psychiatric: see HPI   Constitutional: Decreased appetite; sleeping better   GI: Denies      D.Additional Past Psychiatric, Substance Use, Medical, Family and Social History  Psychiatric: No new information available as compared to previous encounter(s)  Substance Use: No new information available as compared to previous encounter(s)  Medical: No new information available as compared to previous encounter(s)  Family: No new information available as compared to previous encounter(s)  Social: No new information available as compared to previous encounter(s)    Medications:   Current Medications       Scheduled       Medication Ordered Dose/Rate, Route, Frequency Last Action    folic acid (FOLVITE) tablet 1 mg 1 mg, PO, Daily Given, 1 mg at 01/05 0839    lurasidone (LATUDA) tablet 40 mg 40 mg, PO, Daily after dinner Given, 40 mg at 01/08 1804    multivitamin tablet 1 tablet 1  tablet, PO, Daily Given, 1 tablet at 01/05 0839              PRN       Medication Ordered Dose/Rate, Route, Frequency Last Action    acetaminophen (TYLENOL) tablet 650 mg 650 mg, PO, Q6H PRN Given, 650 mg at 01/08 2314    alum & mag hydroxide-simethicone (MAALOX PLUS) 200-200-20 mg/5 mL suspension 30 mL 30 mL, PO, Q6H PRN Ordered    benztropine (COGENTIN) tablet 1 mg (Or Linked Group #1) 1 mg, PO, Once PRN Ordered    benztropine mesylate (COGENTIN) injection 2 mg (Or Linked Group #1) 2 mg, IM, Once PRN Ordered    docusate sodium (COLACE) capsule 100 mg 100 mg, PO, BID PRN Ordered    hydrOXYzine (VISTARIL) capsule 100 mg 100 mg, PO, QHS PRN Given, 100 mg at 01/08 2314    hydrOXYzine (VISTARIL) capsule 50 mg 50 mg, PO, Q4H PRN Given, 50 mg at 01/08 0943    ibuprofen (ADVIL) tablet 800 mg 800 mg, PO, Q8H PRN Given, 800 mg at 01/09 0936    loperamide (IMODIUM) capsule 2 mg 2 mg, PO, Q3H PRN Ordered    OLANZapine (ZyPREXA) tablet 10 mg 10 mg, PO, BID PRN Given, 10 mg at 01/02 0840    OLANZapine (ZyPREXA) tablet 5 mg 5 mg, PO, QHS PRN Ordered    ondansetron (ZOFRAN) tablet 4 mg 4 mg, PO, Q8H PRN Given, 4 mg at 01/06 1150                    II. Examination   Vital signs reviewed:   Blood pressure 118/67, pulse 82, temperature 98.2 F (36.8 C), temperature source Oral, resp. rate 18, SpO2 94 %.     Mental Status Exam  (Level 1 is 1-5, Level 2 is 6-8, Level 3 is 9+)  General appearance: Appears chronological age, adequate hygiene and grooming; wearing green paper pants; read scrub top; red/ yellow/ black cap  Attitude/Behavior: Manic, Cooperative, Eye Contact is good;  Motor: No abnormalities noted  Gait: WNL  Muscle strength and tone: Not tested  Speech:  Spontaneous: Yes; hyperverbal; pressured speech; extremely animated  Rate and Rhythm: Normal  Volume: Normal  Tone: Normal  Clarity: Yes at times  Mood: "Manic"  Affect:  Range: Expansive  Stability: Labile  Appropriateness to thought content: No   Intensity: High;  elevated  congruent  Thought Process:  Coherent: Yes  Logical:  Yes at times  Associations: Goal-directed;circumstantial; tangential;  Thought Content: religiously preoccupied; black history; black struggle; slavery; black rights   Delusions:  Not elicited,    Depressive Cognitions:  None  Suicidal:  Suicidal thoughts with intent, but no clear plans; Passive SI  Homicidal:  No homicidal thoughts  Violent Thoughts:  No  Perceptions:  Dissociative Phenomena:  No  Hallucinations: AH  Insight: Adequate - well aware he was depressed then flipped to mania; knows he should not be discharged without a good plan in place; does not want to end up back on the street using drugs; and sleeping w/ random "whores"; and going on a spending spree  Judgment: Fair - going to groups; taking medications  Cognition:   Level of Consciousness: Intact  Orientation: Intact to self, place and time     Psychiatric / Cognitive Instruments: None     Pertinent Physical Exam: Not relevant to current chief complaint/reason for admission    Imaging / EKG / Labs: Labs in the last 24 hours  Results       ** No results found for the last 24 hours. **          III. Assessment and Plan (Medical Decision Making)     1. I certify that this patient continues to require inpatient hospitalization due to acute risk to self    2. Psychiatric Diagnoses  Bipolar 1 Disorder, current episode depressed, severe, with psychotic features  Rule out substance-induced mood disorder   PTSD  Polysubstance use disorder (alcohol, cocaine, benzodiazepines (?))  Rule out malingering (increasingly likely)      3.All diagnostic procedures completed since admission were reviewed. Past medical records reviewed. Coordination of care was discussed with inpatient team and as available with the outpatient team.     4. On this admission patient educated about and provided input into their treatment plan.  Patient understands potential risks and benefits of proposed treatment plan.       5.  Assessment / Impression  Patient is a 62 y.o. domiciled, employed, single male with PMH of arthritis psychiatric history of Bipolar I disorder, PTSD, and polysubstance use admitted for worsening depression and suicidal ideation in the context of medication non-adherence, social isolation, and inadequate access to care. Patient is admitted on a voluntary basis.     Patient clearly floridly manic today; pressured speech etc. Med changes per below.l      Stopped Prozac/ Zyprexa due to SE; Started Latuda. Titrating up     I gave her the wrong address; [name]     Treatment call mom; best time to call sometime in the evening    Less manic today. Still manic; hyperverbal; pressured; good insight       Suicide Risk Assessment   Suicide Risk Assessment performed? Yes: Suicide Thoughts / Behaviors: None  Current Plan: None  Access to firearm: No  Past suicide attempts: Yes  Diagnosis of MDD, BPAD, Schizophrenia, Cluster B PD, Anorexia, Substance Use: Yes, Bipolar Depression versus substance-induced mood disorder.     Plan / Recommendations:  Biological Plan:  Medications:   Increase Latuda 40 mg po to 60 mg with 350 cal at dinner; states he took it before; no SE; it worked  PRN Vistaril 50 mg and 100 mg po Q4H; Zofran 4 mg PRN  Start Ativan 1.0 QHS; discussed will not Rx on discharge     Medical Work-up: None required at this time  Consults: None required at this time     Psychosocial Plan:  Individual therapy: Psycho-education and psychotherapy of the following modalities will continue to be provided during daily visits:Supportive and Cognitive Behavioral Therapy  Group and milieu therapies: daily per unit's schedule.  Continue with Social Work intervention provided for discharge planning including assistance with placement, case management referral, and follow-up psychiatric care.     Plan for Family Involvement: Patient Refusing  Other Providers Contact Information and Dates Contacted: No Updates      I  personally saw and examined the patient and spent 35  minutes. Patient was also seen by Dr. Carrie Mew. The assessment and plan were discussed and formulated together and has been reviewed and updated as needed.      Signed by: [facility] June C Kagen Kunath, NP  06/03/2022  10:26 AM

## 2022-06-03 NOTE — Group Note (Signed)
Group: [place] - Psychoeducational  Group Topics: Values and Realistic Goals Settings  Group Date: 06/03/2022  Start Time: 1115  End Time: 1200  Number of Participants: 10  Facilitators: Kenton Kingfisher T   Department: [facility] Building [address]    Discipline Led: [place]  Number of Patient Participants: 10/14      Name: [name] Date of Birth: 07/06/1960   MR: [id number]      Level of Participation: active  Quality of Participation: attention seeking, disruptive, distracting to others, hyperactive, offered feedback, pushed limits, and side talking  Interactions with Others: Give feedback  Mood/Affect: manic  Triggers (if applicable): N/A  Cognition: goal directed and rigid  Organization: completed task and disorganized  Progress: Minimal  Response: Pt over talk which cause distraction to peers. Pt stated "God bless you and your heart" and laugh out loud when peers shared about their pronouns and orientation. Pt overall was able to complete group activities.   Plan: patient will be encouraged to attends and participates in all groups. Pt may be put on an alternative treatment to practices active listening skills.     Patients Problems:   Patient Active Problem List   Diagnosis    Alcohol abuse    Cocaine use    Balanitis    Suicidal ideation    Pyuria    Mood disorder    Bipolar disorder    Mania

## 2022-06-03 NOTE — Progress Notes (Signed)
 [name]  62 y.o.  P631/P631.01  male  Active Hospital Problems    Diagnosis    Mania     Legal Status: Voluntary  Vitals:    06/03/22 1941   BP: 101/65   Pulse: 82   Resp:    Temp: 98.4 F (36.9 C)   SpO2: 98%     Blood Sugar Level: N/A  CIWA Score: N/A  COWS Score:N/A  Pain Score: 0/10  Pain Location: N/A  Falls Risk Score: - LOW  Falls Risk Reason: None  Affect: Agitated at times  Mood normal  Hallucinations: denies  Suicidal: No suicidal thoughts  Suicide Alert Level: Routine monitoring  Homicidal: No homicidal thoughts  Depression Level: 0/10  Anxiety Level: 0/10  Sleep Quality:Difficulty falling asleep  Sleep Hours:9hrs  Energy: Energy level is good.  The patient is able to perform most usual functions.  Appetite: normal  NPO Status: N/A  ADLs: Independent with all self care tasks.  Hygiene: disheveled  Date of Last Bowel Movement: today  Number of Groups Attended: 2  Medication/Medication Compliance: accepted  Cheeking Medication: No    LAR/Meds Over Objection: No    Medication Side Effects: none  DVT Prophylaxis: None needed  Compliant with Blood draws/Labs: compliant  Compliant with Vitals: compliant  Elopement risk: No    Sexual aggression: No      Acute Events Overnight:    NONE

## 2022-06-03 NOTE — Progress Notes (Signed)
Karl Bales Petrosian  62 y.o.  P631/P631.01  male  Active Hospital Problems    Diagnosis    Mania     Legal Status: Voluntary  Vitals:    06/02/22 1934   BP: 125/69   Pulse: 79   Resp: 18   Temp: 97.5 F (36.4 C)   SpO2: 95%     Blood Sugar Level: N/A  CIWA Score: N/A  COWS Score:N/A  Pain Score: 4/10  Pain Location: left knee and right knee  Falls Risk Score: - LOW  Falls Risk Reason: None  Affect: Calm/Appropriate  Mood normal  Hallucinations: Auditory, Visual  Suicidal: No suicidal thoughts  Suicide Alert Level: Routine monitoring  Homicidal: No homicidal thoughts  Depression Level: 0/10  Anxiety Level: 0/10  Sleep Quality:Difficulty falling asleep  Sleep Hours:9hrs  Energy: Energy level is good.  The patient is able to perform most usual functions.  Appetite: normal  NPO Status: N/A  ADLs: Independent with all self care tasks.  Hygiene: disheveled  Date of Last Bowel Movement: today  Number of Groups Attended: 2  Medication/Medication Compliance: accepted  Cheeking Medication: No    LAR/Meds Over Objection: No    Medication Side Effects: none  DVT Prophylaxis: None needed  Compliant with Blood draws/Labs: compliant  Compliant with Vitals: compliant  Elopement risk: No    Sexual aggression: No      Acute Events Overnight:    NONE

## 2022-06-03 NOTE — Plan of Care (Signed)
Problem: At Risk for Suicide AS EVIDENCED BY...  Goal: Will identify long and short term goals based on individual needs and strengths  Outcome: Progressing  Goal: Patient will remain safe during hospitalization  Outcome: Progressing  Goal: Suicide Alert Level Moderate  Outcome: Progressing  Goal: Identifies triggers and protective strategies  Outcome: Progressing   [name] was met in the milieu watching tv and socializing with peers. He is alert and oriented x4. He endorsed auditory and visual hallucination. He denied SI/HI. He endorsed anxiety and depression. He endorsed knee pain and rated his pain as 5/10( with 10 being the worse). Pt was glad he did not get discharged and shared his joy with Probation officer. Pt was emotionally supported .He appears disorganized,disheveled,hyper verbal,pressured speech,, appropriate affect intact cognition. Pt requested for PRN for pain and sleep aid . Tylenol and Vistaril was administered with effect. Pt made no medication side effect report. He was encouraged to notify writer if he starts having thought of self harm or others. He agreed. He maintained safety behavior the rest of the shift. Will continue to monitor. He slept 9hrs

## 2022-06-03 NOTE — Group Note (Signed)
Group: [place] - Creative  Group Topics: Expressive   Group Date: 06/03/2022  Start Time: 1500  End Time: 1600  Number of Participants: 10  Facilitators: Kenton Kingfisher T   Department: [facility] Professional Services Building [place]: [place]  Number of Patient Participants: 10/14      Name: [name] Date of Birth: Apr 16, 1961   MR: [id number]      Level of Participation: active  Quality of Participation: defensive and disruptive  Interactions with Others: intrusive  Mood/Affect: agitated  Triggers (if applicable): N/A  Cognition: rigid  Organization: easily frustrated  Progress: Minimal  Response: Pt want to play music that other peers find triggering. Pt found it defensive. This therapist played a relaxing cd that everyone found is beneficial. Pt was able to drum to music playing during group.   Plan: patient will be encouraged to attends and participates in all groups.     Patients Problems:   Patient Active Problem List   Diagnosis    Alcohol abuse    Cocaine use    Balanitis    Suicidal ideation    Pyuria    Mood disorder    Bipolar disorder    Mania

## 2022-06-03 NOTE — Plan of Care (Signed)
Problem: At Risk for Suicide AS EVIDENCED BY...  Goal: Patient will remain safe during hospitalization  Outcome: Progressing   PT is up and out of bed today. Pleasant.   He stated he had given SW the wrong address for him. HE stated it's "[address], [place], [place]"  SW isn't able to locate this address on google. PT gave phone as [phone].   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 PT showed SW a paper which spoke of a segment on [employer]. 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   PT stated he is interested in residential treatment. We called [facility]. Spoke with Frankey Poot who stated they are a dual residential program. Fax is [phone]. PT signed a release for SW to fax record.   Ave length of stay is 6 months.   SW faxed record to [facility].

## 2022-06-03 NOTE — Plan of Care (Signed)
Problem: At Risk for Suicide AS EVIDENCED BY...  Goal: Will identify long and short term goals based on individual needs and strengths  Description: [name] will identify 2 long and 2 short term goals based on individual needs and strengths by 06/04/22    Interventions:  1. Assist [name] to identify long term recovery goal  2. Assist [name] to identify short term goal(s) for treatment   3. Assist  [name] to define criteria for discharge  4. Engage [name] in developing initial treatment plan and signing acknowledgement     Outcome: Progressing  Goal: Patient will remain safe during hospitalization  Description: [name] will remain safe during hospitalization    Interventions:  1. Complete suicide risk assessment at admission, PRN, and prior to discharge  2. Initiate Suicide Alert Level safety interventions as indicated  3. Reassess suicidal ideation every 12 hrs  Outcome: Progressing  Goal: Suicide Alert Level Moderate  Description: Interventions:  1. Every 15 minute in person checks  2. Educate [name] to report increase in suicidal ideation or concerns about safety  3. Reassess suicidal ideation every 12 hrs  4. Complete in-hospital safety plan  5. Order safety tray for meals as indicated   6. Room search for dangerous items per LIP order as indicated  7. Mouth checks following medication administration as indicated  8. Monitor use of bathroom as indicated  9. Assign room close to team station as indicated       Outcome: Progressing  Goal: Identifies triggers and protective strategies  Description: [name] will Identify 3 triggers and 3 protective strategies by 06/04/22    Interventions:  1. Assist [name] to identify events preceding or prompting suicidal ideation  2. Assist [name] to identify distracting activities, thoughts, or calming techniques  3. Assist [name] to identify personal support network    4. Assist [name] to identify professionals, or organizations to contact for help     Outcome:  Progressing  Goal: Verbalizes understanding of medication, benefits, and side effects  Description: [name] will verbalize understanding of medication, benefits, and side effects of each of his medications when received.     Interventions:  1. Ensure Informed consent for all psychotropic medications   2. Provide medication teaching including name, dosage, benefits, action, effect and side effects   3. Educate [name]  to report response to medications including side effects  Outcome: Not Progressing  Goal: Identify and participate in supportive program therapies  Description: [name] will identify and participate in 3 supportive program therapies each day by 06/04/22    Interventions:  1. Assist [name] to target therapy groups that support individual's treatment goals  2. Assist to [facility]  identify personal skill development or activity goal  3. Encourage [name] and reinforce small successes in participation    Outcome: Progressing  Goal: Completes discharge safety and recovery plan  Description: Interventions:  1. Initial evaluation of discharge needs  2. Establish transitional plan to next level of care with follow up appointment date   3. Involve the pVincent's family/support person regarding discharge planning process   4. Identify community resources to support discharge planning   5. Complete suicide risk assessment prior to discharge   6. Address availability of firearms in home environment with family and Jadier  7. Facilitate completion of discharge safety plan  Outcome: Not Progressing     Problem: Thought Disorder  Goal: Demonstrates orientation to person place and time  Description: Demonstrates orientation to person place and time each day during assessment and  interaction    Interventions:  1. Monitor [name]'s orientation status every shift   2. Re-orient the  [name] to environment as needed  Outcome: Progressing  Goal: Conducts goal directed, coherent conversation  Description: [name]  will Conducts goal directed, coherent conversation each day with treatment team by 05/30/22    Interventions:  1. Correct errors of perception in a matter of fact manner   2. Redirect conversations in a calm supportive manner   3. Assess [name]'s conversation every shift  Outcome: Progressing  Goal: Verbalizes reduction in hallucinations/delusions  Description:   [name] will verbalize reduction in hallucinations/delusions by 06/04/22    Interventions:  1. Monitor the presence of hallucinations/delusions every shift   2. Reality test with [name] if [name] able to tolerate  Outcome: Progressing     Problem: Mood Disorder  Goal: Reports improved mood  Description: [name] reports improved mood by 06/04/22    Interventions:  1. Encourage the Steele to verbalize feelings of anxiety, anger, and fears   2. Frequent brief 1:1 conversations with the Adin to assess mood /coping response  Outcome: Progressing     Problem: Safety  Goal: Patient will be free from injury during hospitalization  Description: [name] will be free from injury during hospitalization      Interventions:  1. Assess [name]'s risk for falls and implement fall prevention plan of care per policy      2. Provide and maintain safe environment   3. Use appropriate transfer methods   4. Ensure appropriate safety devices are available at the bedside   5. Include [name]/ family/ care giver in decisions related to safety   6. Hourly rounding   7. Assess for vincents risk for elopement and implement Elopement Risk Plan per policy   8. Provide alternative method of communication if needed (communication boards, writing)  Outcome: Progressing  Goal: Patient will be free from infection during hospitalization  Description: [name]  will be free from infection during hospitalization    Interventions:  1. Assess and monitor for signs and symptoms of infection  2. Monitor lab/diagnostic results  3. Monitor all insertion sites (i.e. indwelling lines, tubes, urinary  catheters, and drains)  4. Encourage [name] and family to use good hand hygiene technique  Outcome: Progressing     Problem: Pain interferes with ability to perform ADL  Goal: Pain at adequate level as identified by patient  Description: Interventions:  1. Identify patient comfort function goal  2. Evaluate if patient comfort function goal is met  3. Assess pain on admission, during daily assessment and/or before any "as needed" intervention(s)  4. Reassess pain within 30-60 minutes of any procedure/intervention, per Pain Assessment, Intervention, Reassessment (AIR) Cycle  5. Evaluate patient's satisfaction with pain management progress  6. Offer non-pharmacological pain management interventions  7. Consult/collaborate with Pain Service  8. Consult/collaborate with Physical Therapy, Occupational Therapy, and/or Speech Therapy  9. Assess for risk of opioid induced respiratory depression and side effects, including snoring/sleep apnea. Alert healthcare team of risk factors identified.  10. Include patient/patient care companion in decisions related to pain management as needed  Outcome: Progressing     Problem: Side Effects from Pain Analgesia  Goal: Patient will experience minimal side effects of analgesic therapy  Description: Interventions:  1. Monitor/assess patient's respiratory status (RR depth, effort, breath sounds)  2. Assess for changes in cognitive function   3. Prevent/manage side effects per LIP orders (i.e. nausea, vomiting, pruritus, constipation, urinary retention, etc.)  4. Evaluate for opioid-induced  sedation with appropriate assessment tool (i.e. POSS)  Outcome: Progressing   [name] was A+Ox4 with blunted affect and pressured speech. Denies SI/HI/AVH/SIB urges. BVC is 0. Pt states they are committed to maintaining their safety, encouraged to come to staff if thoughts or urges to harm self or others increases, Jan verbalizes understanding. Pt is cooperative with Clinical research associate, able to answer questions  and maintain  eye contact. Pt describes their mood as depressed. Reported good  sleep/energy/appetite.   Wendle states their goal for today is attend groups. Gentry is non compliant with scheduled  medication, Requested for "PRN"medications, same administered and mouth check completed. Nas was visible in the milieu throughout the shift, participated in groups with peers and staff appropriately. Q15 min safety checks maintained. Will continue to monitor and ensure safety.  PRN, Motrin and tylenol administered for c/o pain with minimal relief.      "I, Jason Hauge Macon Large RN, hereby attest that I have been made aware of and reviewed the following for this patient:    - Biopsychosocial assessment;  - Diagnoses;  - Nursing needs and medical support, if applicable; and  - Treatment Plan Goals, Objectives and Strategies/Interventions.    I am involved in the care of this patient within my scope of practice and assigned tasks. I approve the treatment plan and this attestation is in lieu of a signature on the document."

## 2022-06-04 MED ORDER — CLONIDINE HCL 0.1 MG PO TABS
0.1000 mg | ORAL_TABLET | Freq: Every evening | ORAL | Status: DC
Start: 2022-06-04 — End: 2022-06-05
  Administered 2022-06-04: 0.1 mg via ORAL
  Filled 2022-06-04: qty 1

## 2022-06-04 MED ORDER — CLONIDINE HCL 0.1 MG PO TABS
0.1000 mg | ORAL_TABLET | Freq: Every evening | ORAL | Status: DC
Start: 2022-06-04 — End: 2022-06-04

## 2022-06-04 MED ORDER — ARIPIPRAZOLE 10 MG PO TABS
10.0000 mg | ORAL_TABLET | Freq: Every day | ORAL | Status: DC
Start: 2022-06-04 — End: 2022-06-05
  Administered 2022-06-04 – 2022-06-05 (×2): 10 mg via ORAL
  Filled 2022-06-04 (×2): qty 1

## 2022-06-04 NOTE — Plan of Care (Signed)
Problem: At Risk for Suicide AS EVIDENCED BY...  Goal: Will identify long and short term goals based on individual needs and strengths  Description: Derrell will identify 2 long and 2 short term goals based on individual needs and strengths by 06/04/22    Interventions:  1. Assist Jeromi to identify long term recovery goal  2. Assist Jatavis to identify short term goal(s) for treatment   3. Assist  Jafeth to define criteria for discharge  4. Engage Vance in developing initial treatment plan and signing acknowledgement     06/04/2022 1613 by Janett Labella, RN  Outcome: Progressing  06/04/2022 1613 by Janett Labella, RN  Outcome: Progressing  Goal: Patient will remain safe during hospitalization  Description: Avrahom will remain safe during hospitalization    Interventions:  1. Complete suicide risk assessment at admission, PRN, and prior to discharge  2. Initiate Suicide Alert Level safety interventions as indicated  3. Reassess suicidal ideation every 12 hrs  06/04/2022 1613 by Janett Labella, RN  Outcome: Progressing  06/04/2022 1613 by Janett Labella, RN  Outcome: Progressing  Goal: Suicide Alert Level Moderate  Description: Interventions:  1. Every 15 minute in person checks  2. Educate Clerence to report increase in suicidal ideation or concerns about safety  3. Reassess suicidal ideation every 12 hrs  4. Complete in-hospital safety plan  5. Order safety tray for meals as indicated   6. Room search for dangerous items per LIP order as indicated  7. Mouth checks following medication administration as indicated  8. Monitor use of bathroom as indicated  9. Assign room close to team station as indicated       06/04/2022 1613 by Janett Labella, RN  Outcome: Progressing  06/04/2022 1613 by Janett Labella, RN  Outcome: Progressing  Goal: Identifies triggers and protective strategies  Description: Lamel will Identify 3 triggers and 3 protective strategies by 06/04/22    Interventions:  1. Assist Cleotha to  identify events preceding or prompting suicidal ideation  2. Assist Jacey to identify distracting activities, thoughts, or calming techniques  3. Assist Clydell to identify personal support network    4. Assist Ryelan to identify professionals, or organizations to contact for help     06/04/2022 1613 by Janett Labella, RN  Outcome: Progressing  06/04/2022 1613 by Janett Labella, RN  Outcome: Progressing  Goal: Verbalizes understanding of medication, benefits, and side effects  Description: Dohnovan will verbalize understanding of medication, benefits, and side effects of each of his medications when received.     Interventions:  1. Ensure Informed consent for all psychotropic medications   2. Provide medication teaching including name, dosage, benefits, action, effect and side effects   3. Educate Rocky  to report response to medications including side effects  06/04/2022 1613 by Janett Labella, RN  Outcome: Progressing  06/04/2022 1613 by Janett Labella, RN  Outcome: Progressing  Goal: Identify and participate in supportive program therapies  Description: Taige will identify and participate in 3 supportive program therapies each day by 06/04/22    Interventions:  1. Assist Hamidou to target therapy groups that support individual's treatment goals  2. Assist to [facility]  identify personal skill development or activity goal  3. Encourage Keylon attendance and reinforce small successes in participation    06/04/2022 1613 by Janett Labella, RN  Outcome: Not Progressing  06/04/2022 1613 by Janett Labella, RN  Outcome: Progressing  Goal: Completes discharge safety and recovery plan  Description: Interventions:  1. Initial evaluation of discharge needs  2. Establish transitional plan to next level of care with follow up appointment date   3. Involve the pVincent's family/support person regarding discharge planning process   4. Identify community resources to support discharge planning   5. Complete suicide risk  assessment prior to discharge   6. Address availability of firearms in home environment with family and Saabir  7. Facilitate completion of discharge safety plan  06/04/2022 1613 by Janett Labella, RN  Outcome: Not Progressing  06/04/2022 1613 by Janett Labella, RN  Outcome: Not Progressing     Problem: Thought Disorder  Goal: Demonstrates orientation to person place and time  Description: Demonstrates orientation to person place and time each day during assessment and interaction    Interventions:  1. Monitor Juanmiguel's orientation status every shift   2. Re-orient the  Baraka to environment as needed  06/04/2022 1613 by Janett Labella, RN  Outcome: Progressing  06/04/2022 1613 by Janett Labella, RN  Outcome: Progressing  Goal: Conducts goal directed, coherent conversation  Description: Omarii will Conducts goal directed, coherent conversation each day with treatment team by 05/30/22    Interventions:  1. Correct errors of perception in a matter of fact manner   2. Redirect conversations in a calm supportive manner   3. Assess Chaysen's conversation every shift  06/04/2022 1613 by Janett Labella, RN  Outcome: Progressing  06/04/2022 1613 by Janett Labella, RN  Outcome: Progressing  Goal: Verbalizes reduction in hallucinations/delusions  Description:   Hazen will verbalize reduction in hallucinations/delusions by 06/04/22    Interventions:  1. Monitor the presence of hallucinations/delusions every shift   2. Reality test with Alexsander if Arya able to tolerate  06/04/2022 1613 by Janett Labella, RN  Outcome: Progressing  06/04/2022 1613 by Janett Labella, RN  Outcome: Progressing     Problem: Mood Disorder  Goal: Reports improved mood  Description: Alema reports improved mood by 06/04/22    Interventions:  1. Encourage the Jordin to verbalize feelings of anxiety, anger, and fears   2. Frequent brief 1:1 conversations with the [facility] to assess mood /coping response  06/04/2022 1613 by Janett Labella,  RN  Outcome: Progressing  06/04/2022 1613 by Janett Labella, RN  Outcome: Progressing     Problem: Safety  Goal: Patient will be free from injury during hospitalization  Description: Demichael will be free from injury during hospitalization      Interventions:  1. Assess Dametrius's risk for falls and implement fall prevention plan of care per policy      2. Provide and maintain safe environment   3. Use appropriate transfer methods   4. Ensure appropriate safety devices are available at the bedside   5. Include Kalid/ family/ care giver in decisions related to safety   6. Hourly rounding   7. Assess for vincents risk for elopement and implement Elopement Risk Plan per policy   8. Provide alternative method of communication if needed [employer], writing)  06/04/2022 1613 by Janett Labella, RN  Outcome: Progressing  06/04/2022 1613 by Janett Labella, RN  Outcome: Progressing  Goal: Patient will be free from infection during hospitalization  Description: Gurbir  will be free from infection during hospitalization    Interventions:  1. Assess and monitor for signs and symptoms of infection  2. Monitor lab/diagnostic results  3. Monitor all insertion sites (i.e. indwelling lines, tubes, urinary catheters, and drains)  4. Encourage Zuri and family to use good hand hygiene technique  06/04/2022 1613 by Janett Labella, RN  Outcome: Progressing  06/04/2022 1613 by Janett Labella, RN  Outcome: Progressing     Problem: Pain  Goal: Pain at adequate level as identified by patient  Description: Pain at adequate level as identified by Evette Doffing    Interventions:  1. Identify Jacobb comfort function goal  2. Evaluate if [facility] comfort function goal is met  3. Assess pain on admission, during daily assessment and/or before any "as needed" intervention(s)  4. Reassess pain within 30-60 minutes of any procedure/intervention, per Pain Assessment, Intervention, Reassessment (AIR) Cycle  5. Evaluate Garnie's  satisfaction with pain management progress  6. Offer non-pharmacological pain management interventions  7. Consult/collaborate with Pain Service  8. Consult/collaborate with Physical Therapy, Occupational Therapy, and/or Speech Therapy  9. Assess for risk of opioid induced respiratory depression and side effects, including snoring/sleep apnea. Alert healthcare team of risk factors identified.  [place] in decisions related to pain management as needed  06/04/2022 1613 by Janett Labella, RN  Outcome: Progressing  06/04/2022 1613 by Janett Labella, RN  Outcome: Progressing     Problem: Pain interferes with ability to perform ADL  Goal: Pain at adequate level as identified by patient  Description: Interventions:  1. Identify patient comfort function goal  2. Evaluate if patient comfort function goal is met  3. Assess pain on admission, during daily assessment and/or before any "as needed" intervention(s)  4. Reassess pain within 30-60 minutes of any procedure/intervention, per Pain Assessment, Intervention, Reassessment (AIR) Cycle  5. Evaluate patient's satisfaction with pain management progress  6. Offer non-pharmacological pain management interventions  7. Consult/collaborate with Pain Service  8. Consult/collaborate with Physical Therapy, Occupational Therapy, and/or Speech Therapy  9. Assess for risk of opioid induced respiratory depression and side effects, including snoring/sleep apnea. Alert healthcare team of risk factors identified.  10. Include patient/patient care companion in decisions related to pain management as needed  06/04/2022 1613 by Janett Labella, RN  Outcome: Progressing  06/04/2022 1613 by Janett Labella, RN  Outcome: Progressing     Problem: Side Effects from Pain Analgesia  Goal: Patient will experience minimal side effects of analgesic therapy  Description: Interventions:  1. Monitor/assess patient's respiratory status (RR depth, effort, breath sounds)  2. Assess for changes  in cognitive function   3. Prevent/manage side effects per LIP orders (i.e. nausea, vomiting, pruritus, constipation, urinary retention, etc.)  4. Evaluate for opioid-induced sedation with appropriate assessment tool (i.e. POSS)  06/04/2022 1613 by Janett Labella, RN  Outcome: Progressing  06/04/2022 1613 by Janett Labella, RN  Outcome: Progressing   Donterius was A+Ox4 with flat affect and pressured speech. Denies SI/HI/AVH/SIB urges. BVC is 0. Pt states they are committed to maintaining their safety, encouraged to come to staff if thoughts or urges to harm self or others increases, Brook verbalizes understanding. Pt is guarded and cooperative with Probation officer, able to answer questions and maintain eye contact. Pt describes their mood as depressed. Reported good  sleep/energy/appetite.  Ricahrd rated their depression as 5/10 and anxiety as 3/10 (with 10 being the highest possible value). Johney states their goal for today is attend group therapies. Hayato is compliant with select medication administration and mouth check completed. Raeqwon was visible in the milieu throughout the shift, participated in groups with peers and staff appropriately. Q15 min safety checks maintained. Will continue to monitor and ensure safety.  PRN Motrin and Tylenol administered for pain with minimal relief.      "I, Janett Labella RN, hereby attest that I have been made aware of  and reviewed the following for this patient:    - Biopsychosocial assessment;  - Diagnoses;  - Nursing needs and medical support, if applicable; and  - Treatment Plan Goals, Objectives and Strategies/Interventions.    I am involved in the care of this patient within my scope of practice and assigned tasks. I approve the treatment plan and this attestation is in lieu of a signature on the document."

## 2022-06-04 NOTE — Group Note (Signed)
Group: [place] - Wrap Up  Group Topics: Day in Review  Group Date: 06/04/2022  Start Time: 1830  End Time: 1930  Number of Participants: 7  Facilitators: Franne Forts   Department: [facility] [address]    Discipline Led: [place]  Number of Patient Participants: 7      Name: [name] Date of Birth: [birth year]   MR: [id number]      Level of Participation: active  Quality of Participation: engaged, offered feedback, redirectable, side talking, and hypertalkative, giggly  Interactions with Others:  social and hyper verbal  Mood/Affect: bright  Triggers (if applicable): N/A  Cognition: goal directed, religious preoccupation, and tangential  Organization: completed task  Progress: Moderate  Response: Patient appeared to be falling asleep early in the group. He did awaken when called on. He was hyper verbal and giggly, still manic but less so. He talked about the Bible, the Koran, both of which he had with him and his Higher Power. Patient said he didn't go to groups today because he couldn't be in the same room with some people who were discharged so now he could attend groups.   Plan: patient will be encouraged to continue to attend groups.     Patients Problems:   Patient Active Problem List   Diagnosis    Alcohol abuse    Cocaine use    Balanitis    Suicidal ideation    Pyuria    Mood disorder    Bipolar disorder    Mania
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
## 2022-06-04 NOTE — Plan of Care (Signed)
Problem: At Risk for Suicide AS EVIDENCED BY...  Goal: Patient will remain safe during hospitalization  Outcome: Progressing   PT stated he may be ready for discharge early next week, but not sooner. PT stated that he wants to "get right". He stated he's not ready for discharge yet.

## 2022-06-04 NOTE — Plan of Care (Signed)
Problem: At Risk for Suicide AS EVIDENCED BY...  Goal: Will identify long and short term goals based on individual needs and strengths  Outcome: Progressing  Goal: Patient will remain safe during hospitalization  Outcome: Progressing  Goal: Suicide Alert Level Moderate  Outcome: Progressing    [place] was met in the milieu watching tv and socializing with peers. He is alert and oriented x4. He denied auditory and visual hallucination. He denied SI/HI. He denied anxiety and depression. He denied symptoms of pain. Pt was observed being loud discussing about religion with some of  his peer in the milieu . He is sometimes difficult to redirect.He appears disorganized,disheveled,hyper verbal,pressured speech,, appropriate affect ,intact cognition. Pt requested for PRN for sleep  and Vistaril was administered with good effect. Pt made no medication side effect report. He was encouraged to notify writer if he starts having thought of self harm or others. He agreed. He maintained safety behavior the rest of the shift. He made no medication side effect report. Will continue to monitor. He slept 9hrs
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
## 2022-06-04 NOTE — Group Note (Signed)
Group: [place] - Mindfulness  Group Topics: 10 minute mindfulness meditation and discussion  Group Date: 06/04/2022  Start Time: 1930  End Time: 2000  Number of Participants: 4  Facilitators: Franne Forts   Department: [facility] [address]    Discipline Led: [place]  Number of Patient Participants: 4      Name: [name] Date of Birth: [birth year]   MR: [id number]      Response: Patient followed along with the meditation. He said he had an out of the body experience which he has a lot. He said he felt like he was on the ceiling looking down and noticed that there was a hole in the top of his hat. By the end of the group discussion patient said he felt grounded from talking to the patient on his left and assured TW that he was back in is body.   Plan: patient will be encouraged to continue to attend groups    Patients Problems:   Patient Active Problem List   Diagnosis    Alcohol abuse    Cocaine use    Balanitis    Suicidal ideation    Pyuria    Mood disorder    Bipolar disorder    Mania

## 2022-06-04 NOTE — Progress Notes (Signed)
Util Review transferred a message to SW from PT' s insurance.   PT is a high utilizer of inpt units. She's seeking information about [place] plan. [phone] ext [phone]  Email [email or web address]    SW returned call. Spoke with Wells Guiles about the [employer].   Pt was in rehab in Oct 2023   10-13 to 10-18.   Wells Guiles stated that PT is a high utilizer.   PT has been inpatient since November 21.

## 2022-06-05 MED ORDER — ARIPIPRAZOLE 15 MG PO TABS
15.0000 mg | ORAL_TABLET | Freq: Every day | ORAL | Status: DC
Start: 2022-06-06 — End: 2022-06-06
  Administered 2022-06-06: 15 mg via ORAL
  Filled 2022-06-05: qty 1

## 2022-06-05 NOTE — Group Note (Signed)
Group: [place] - Group Therapy   Group Topics: group therapy  Group Date: 06/05/2022  Start Time: 1330  End Time: 9702  Number of Participants: 9  Facilitators: Hewitt Shorts   Department: [facility] [address]    Discipline Led: Behavioral health therapy  Number of Patient Participants: 9      Name: [name] Date of Birth: Sep 24, 1960   MR: [id number]      Level of Participation: moderate  Quality of Participation: attention seeking, cooperative, distractible, distracting to others, initiates communication, and offered feedback  Interactions with Others: gave feedback and monopolizing  Mood/Affect: angry, labile, and restless  Triggers (if applicable): uta  Cognition: blaming, disorganized , flight of Ideas, not focused, obsessive, perseverating, and religious preoccupation  Organization: disorganized, distractible, and needed assistance  Progress: Minimal  Response: pt reported feeling safe at unit, referring to unit as a "comfort zone", adding his perspective that world is unforgiving. He reported that although he loves his family, they are "toxic", however he wants them to accept him. He reported looking forward to religious life.  Plan: patient will be encouraged to continue to engage in groups.    Patients Problems:   Patient Active Problem List   Diagnosis    Alcohol abuse    Cocaine use    Balanitis    Suicidal ideation    Pyuria    Mood disorder    Bipolar disorder    Mania

## 2022-06-05 NOTE — Plan of Care (Signed)
Problem: At Risk for Suicide AS EVIDENCED BY...  Goal: Will identify long and short term goals based on individual needs and strengths  Outcome: Progressing  Goal: Patient will remain safe during hospitalization  Outcome: Progressing  Flowsheets (Taken 06/05/2022 2230)  Patient will remain safe during hospitalization: Assess Suicide Risk each shift using Suicide Assessment Tool (SAT)  Goal: Identifies triggers and protective strategies  Outcome: Progressing  Flowsheets (Taken 06/05/2022 2230)  Identifies triggers and protecive strategies: Assist to identify safe, positive strategies to cope  Goal: Identify and participate in supportive program therapies  Outcome: Progressing  Flowsheets (Taken 06/05/2022 2230)  Identify and participate in supportive program therapies: Encourage attendance and reinforce small successes in participation     Problem: Mood Disorder  Goal: Reports improved mood  Outcome: Progressing  Flowsheets (Taken 06/05/2022 2230)  Reports improved mood: Encourage the patient to verbalize feelings of anxiety, anger, and fears       [name] was alert and oriented x4 with flat affect and pressured/loud speech. Pt was visible in the milieu and interacting with peers appropriately. PT was calm and cooperative. Pt currently denies SI/SIB/AVH/HI and reports SI comes and goes throughout the day. Pt reports anxiety at 3/10 and depression at 3/10 with 10 being the worst. Sleep reported being interrupted but "getting better" previous night. Increased energy level and okay appetite reported. Mood is "depressed but feel safe here." C/o bilateral knees pain at 10/10; Tylenol given with some positive effect. Pt requested Vistaril for sleep; administered. Pt is compliant with his medication without adverse reactions. No acute behavioral or safety issues noted. Q 15 minute safety checks maintained. Will continue to monitor and ensure safety.     "I, Damita Dunnings, hereby attest that I have been made aware of and  reviewed the following for this patient:    - Biopsychosocial assessment;  - Diagnoses;  - Nursing needs and medical support, if applicable; and  - Treatment Plan Goals, Objectives and Strategies/Interventions.    I am involved in the care of this patient within my scope of practice and assigned tasks. I approve the treatment plan and this attestation is in lieu of a signature on the document."

## 2022-06-05 NOTE — Progress Notes (Signed)
[name]  62 y.o.  P631/P631.01  male  Active Hospital Problems    Diagnosis    Mania     Legal Status: Voluntary  Vitals:    06/05/22 0640   BP: 99/60   Pulse: 75   Resp:    Temp: 98.2 F (36.8 C)   SpO2: 97%     Blood Sugar Level: N/A  CIWA Score: N/A  COWS Score:N/A  Pain Score: 2/10  Pain Location: knee pain  Falls Risk Score: - LOW  Falls Risk Reason: None  Affect: constricted  Mood normal/playful  Hallucinations: denies  Suicidal: No suicidal thoughts  Suicide Alert Level: Routine monitoring  Homicidal: No homicidal thoughts  Depression Level: 3/10  Anxiety Level: 3/10  Sleep Quality:Difficulty falling asleep  Sleep Hours:9hrs  Energy: Energy level is good.  The patient is able to perform most usual functions.  Appetite: normal  NPO Status: N/A  ADLs: Independent with all self care tasks.  Hygiene: disheveled  Date of Last Bowel Movement: today  Number of Groups Attended: 2  Medication/Medication Compliance: accepted  Cheeking Medication: No    LAR/Meds Over Objection: No    Medication Side Effects: none  DVT Prophylaxis: None needed  Compliant with Blood draws/Labs: compliant  Compliant with Vitals: compliant  Elopement risk: No    Sexual aggression: No      Acute Events Overnight:    NONE

## 2022-06-05 NOTE — Treatment Plan (Signed)
Interdisciplinary Treatment Plan Update Meeting    06/05/2022  [name]    Participants:  Attending Physician:  Jeri Cos, MD, Felecia Shelling, NP,  RN - Harley Hallmark    Short Term Goal:"get the right meds"  Long Term Goal: "stay out of the hospital"     Objective:  Review response to treatment, reassess needs/goals, update plan as indicated incorporating patient's strengths and stated needs, goals, and preferences.      1. Summary of Patient Progress on Treatment Plan Goals:  Pt attending groups, tolerating medications with no side effects noted    2. Level of Patient Involvement:  Actively engaged/contributing    3. Patient Understanding of Plan of Care:  Able to verbalize goals and interventions    4. Level of Agreement/Commitment to Plan of Care:  Agrees with and is committed to plan of care          Contributor Signatures:      MD_________________________________ Date___________________    SW_________________________________Date ___________________    RN _________________________________Date____________________    Patient_______________________________Date____________________    Other________________________________Date ___________________    Other________________________________Date ___________________    (This document is signed electronically by Probation officer and electronic co-signer.  Other participants sign a printed copy which is scanned into the EMR)

## 2022-06-05 NOTE — Progress Notes (Signed)
Psychiatry Progress Note  (Level 1 = Problem Focused, Level 2 = Expanded Problem Focused, Level 3 = Detailed)    Patient Name: [name]            Current Date/Time:  1/11/202411:31 AM  MRN:  [id number]                            Attending Physician: Jeri Cos, MD  DOB: [birth year]                                Gender: male                              I. History   Informants: patient, chart review, clinical staff  A. Chief Complaint or Reason for Admission  "I have PTSD and Bipolar and I have been off of my medications for two weeks. I have a history of cutting myself and I have been having feeling hopeless and suicidal"   Admitted for SI and CAH.     B.History of Present Illness: Interval History     (Symptoms and qualifiers:1 for Level 1, 2 for Level 2 and 3+ for Level 3)  [name] is a 62 year old domiciled, employed, single man with past medical hx of arthritis and past psychiatric hx of PTSD, bipolar I disorder, and polysubstance use who presented with worsening depression, and SI in the context of medication nonadherence, recent alcohol and cocaine use, and social isolation.        INTERVAL HISTORY: June 05, 2022    Patient seen and examined in his room. Today, he shares that he is "still really manic" and "not stable to go anywhere else yet". He reports sleeping poorly overnight (despite documentation indicating 9 hours of sleep, patient reports that he woke up at 2 AM and was unable to fall back asleep). He shares that his nighttime medication (clonidine) made him "sick" (nausea, dizziness) and he states that vistaril 100 mg + ativan works best for him. Discussed plan to discontinue ativan prior to discharge and patient verbalized understanding. Patient reports tolerating Abilify well thus far and he has been selectively attending groups.     Patient continues to verbalize intermittent passive SI, although he currently feels safe in the hospital and he denies any active plans or  intent. He additionally reports ongoing AH telling him negative things like "the world is coming to an end, you might as well be dead," etc. He states that he has experienced auditory hallucinations since he was 62 years old, and they have never entirely subsided, even with medication management. Patient identifies his faith as his primary protective factor and motivation for living. He is then hyperverbal and labile as he shares about religion, his family, his history of trauma, racial discrimination, and his frustrations with capitalism in the [place]. He remains interested in discharging to a dual-diagnosis rehab facility, however he advocates for continued hospitalization at this time for medication management and stability.     C.Medical History  Review of Systems  (Level 1 is none, Level 2 is 1, and Level 3 is 2+)  A complete 14 point ROS was done and was negative except for:   Psychiatric: see HPI   Constitutional: See HPI  GI: reports nausea associated with clonidine  D.Additional Past Psychiatric, Substance Use, Medical, Family and Social History  Psychiatric: No new information available as compared to previous encounter(s)  Substance Use: No new information available as compared to previous encounter(s)  Medical: No new information available as compared to previous encounter(s)  Family: No new information available as compared to previous encounter(s)  Social: No new information available as compared to previous encounter(s)    Medications:   Current Medications       Scheduled       Medication Ordered Dose/Rate, Route, Frequency Last Action    ARIPiprazole (ABILIFY) tablet 10 mg 10 mg, PO, Daily Given, 10 mg at 01/11 0913    cloNIDine (CATAPRES) tablet 0.1 mg 0.1 mg, PO, QHS Given, 0.1 mg at 01/10 2358    folic acid (FOLVITE) tablet 1 mg 1 mg, PO, Daily Given, 1 mg at 01/05 0839    LORazepam (ATIVAN) tablet 1 mg 1 mg, PO, QHS Given, 1 mg at 01/10 2232    multivitamin tablet 1 tablet 1 tablet,  PO, Daily Given, 1 tablet at 01/05 0839    nystatin (NYSTOP) powder No Dose/Rate, TP, BID Given, No Dose/Rate at 01/11 0914              PRN       Medication Ordered Dose/Rate, Route, Frequency Last Action    acetaminophen (TYLENOL) tablet 650 mg 650 mg, PO, Q6H PRN Given, 650 mg at 01/10 1443    alum & mag hydroxide-simethicone (MAALOX PLUS) 200-200-20 mg/5 mL suspension 30 mL 30 mL, PO, Q6H PRN Ordered    benztropine (COGENTIN) tablet 1 mg (Or Linked Group #1) 1 mg, PO, Once PRN Ordered    benztropine mesylate (COGENTIN) injection 2 mg (Or Linked Group #1) 2 mg, IM, Once PRN Ordered    docusate sodium (COLACE) capsule 100 mg 100 mg, PO, BID PRN Ordered    hydrOXYzine (VISTARIL) capsule 100 mg 100 mg, PO, QHS PRN Given, 100 mg at 01/10 2232    hydrOXYzine (VISTARIL) capsule 50 mg 50 mg, PO, Q4H PRN Given, 50 mg at 01/08 0943    ibuprofen (ADVIL) tablet 800 mg 800 mg, PO, Q8H PRN Given, 800 mg at 01/11 1025    loperamide (IMODIUM) capsule 2 mg 2 mg, PO, Q3H PRN Ordered    OLANZapine (ZyPREXA) tablet 10 mg 10 mg, PO, BID PRN Given, 10 mg at 01/02 0840    OLANZapine (ZyPREXA) tablet 5 mg 5 mg, PO, QHS PRN Ordered    ondansetron (ZOFRAN) tablet 4 mg 4 mg, PO, Q8H PRN Given, 4 mg at 01/06 1150                    II. Examination   Vital signs reviewed:   Blood pressure 99/60, pulse 75, temperature 98.2 F (36.8 C), temperature source Oral, resp. rate 18, SpO2 97 %.     Mental Status Exam  (Level 1 is 1-5, Level 2 is 6-8, Level 3 is 9+)  General appearance: Appears chronological age, adequate hygiene and grooming; wearing green paper pants; Wearing red scrub top, red/ yellow/ black cap  Attitude/Behavior: Cooperative, Eye Contact is good;   Motor: No abnormalities noted  Gait: WNL  Muscle strength and tone: Not tested  Speech:  Spontaneous: Yes; hyperverbal  Rate and Rhythm: increased  Volume: Normal  Tone: Normal  Clarity: Yes at times  Mood: "really manic"  Affect:  Range: Expansive  Stability: Labile  Appropriateness  to thought content: Yes   Intensity: normal  congruent  Thought Process:  Coherent: Yes  Logical:  Yes at times  Associations: Goal-directed; circumstantial; tangential;  Thought Content: religiously preoccupied; black history; black struggle; slavery; black rights   Delusions:  Not elicited    Depressive Cognitions:  None  Suicidal:  Suicidal thoughts but no clear plans; Passive SI  Homicidal:  No homicidal thoughts  Violent Thoughts:  No  Perceptions:  Dissociative Phenomena:  No  Hallucinations: AH  Insight: Adequate - knows he should not be discharged without a good plan in place; recognized benefit of continued monitoring for stability and medication management, does not want to end up back on the street using drugs;   Judgment: Fair - going to groups; taking medications; knows his triggers  Cognition:   Level of Consciousness: Intact  Orientation: Intact to self, place and time     Psychiatric / Cognitive Instruments: None     Pertinent Physical Exam: Not relevant to current chief complaint/reason for admission    Imaging / EKG / Labs: Labs in the last 24 hours  Results       ** No results found for the last 24 hours. **          III. Assessment and Plan (Medical Decision Making)     1. I certify that this patient continues to require inpatient hospitalization due to acute risk to self    2. Psychiatric Diagnoses  Bipolar 1 Disorder, current episode depressed, severe, with psychotic features  Rule out substance-induced mood disorder   PTSD  Polysubstance use disorder (alcohol, cocaine, benzodiazepines (?))  Rule out malingering (increasingly likely)      3.All diagnostic procedures completed since admission were reviewed. Past medical records reviewed. Coordination of care was discussed with inpatient team and as available with the outpatient team.     4. On this admission patient educated about and provided input into their treatment plan.  Patient understands potential risks and benefits of proposed  treatment plan.      5.  Assessment / Impression  Patient is a 62 y.o. domiciled, employed, single male with PMH of arthritis psychiatric history of Bipolar I disorder, PTSD, and polysubstance use admitted for worsening depression and suicidal ideation in the context of medication non-adherence, social isolation, and inadequate access to care. Patient is admitted on a voluntary basis.     On evaluation today, patient is somewhat hyperverbal and hyperreligious and he endorses poor sleep overnight. He reports ongoing AH along with passive SI but he currently feels safe in the hospital. Tolerating Abilify well although has expressed minimal improvement in symptoms. Requests to discontinue clonidine because he experienced nausea and dizziness last night. Patient remains interested in rehab upon discharge when stable. Will discontinue clonidine and increase Abilify to 15 mg daily.     At this time, the patient would continue to benefit from inpatient psychiatric hospitalization for safety, stabilization, diagnostic clarification, medication management, and discharge planning.     Suicide Risk Assessment   Suicide Risk Assessment performed? Yes: Suicide Thoughts / Behaviors: None  Current Plan: None  Access to firearm: No  Past suicide attempts: Yes  Diagnosis of MDD, BPAD, Schizophrenia, Cluster B PD, Anorexia, Substance Use: Yes, Bipolar Depression versus substance-induced mood disorder.     Plan / Recommendations:     Biological Plan:  Medications:   PRN Vistaril 100 mg po Q4H; Zofran 4 mg PRN  Continue Ativan 1.0 QHS; discussed will not Rx on discharge  Discontinue Clonidine 0.1 mg   Increase Abilify  to 15 mg po daily      Medical Work-up: None required at this time  Consults: None required at this time     Psychosocial Plan:  Individual therapy: Psycho-education and psychotherapy of the following modalities will continue to be provided during daily visits:Supportive and Cognitive Behavioral Therapy  Group and milieu  therapies: daily per unit's schedule.  Continue with Social Work intervention provided for discharge planning including assistance with placement, case management referral, and follow-up psychiatric care.     Plan for Family Involvement: Patient Refusing  Other Providers Contact Information and Dates Contacted: No Updates    I personally saw and examined the patient and spent 35 minutes on this visit reviewing previous notes, counseling the patient and/or family members regarding the patient's condition(s), ordering of tests, managing medications, and documenting the findings in the note.   Patient was also seen by Dr. Carrie Mew. The assessment and plan were discussed and formulated together and has been reviewed and updated as needed.    Signed by: Felecia Shelling, NP  06/05/2022  11:31 AM

## 2022-06-05 NOTE — Plan of Care (Addendum)
PT asked if sW has heard from the residential facility and SW shared that they accepted him. PT asked when he's going and SW shared with PT that SW hasn't heard from the MD about a [facility] date yet. PT is social with peers. Loud tone of voice.

## 2022-06-05 NOTE — Plan of Care (Signed)
Problem: At Risk for Suicide AS EVIDENCED BY...  Goal: Will identify long and short term goals based on individual needs and strengths  Description: [name] will identify 2 long and 2 short term goals based on individual needs and strengths by 06/04/22    Interventions:  1. Assist [name] to identify long term recovery goal  2. Assist [name] to identify short term goal(s) for treatment   3. Assist  [name] to define criteria for discharge  4. Engage [name] in developing initial treatment plan and signing acknowledgement     Outcome: Progressing  Goal: Patient will remain safe during hospitalization  Description: [name] will remain safe during hospitalization    Interventions:  1. Complete suicide risk assessment at admission, PRN, and prior to discharge  2. Initiate Suicide Alert Level safety interventions as indicated  3. Reassess suicidal ideation every 12 hrs  Outcome: Progressing  Goal: Suicide Alert Level Moderate  Description: Interventions:  1. Every 15 minute in person checks  2. Educate [name] to report increase in suicidal ideation or concerns about safety  3. Reassess suicidal ideation every 12 hrs  4. Complete in-hospital safety plan  5. Order safety tray for meals as indicated   6. Room search for dangerous items per LIP order as indicated  7. Mouth checks following medication administration as indicated  8. Monitor use of bathroom as indicated  9. Assign room close to team station as indicated       Outcome: Progressing     Problem: Thought Disorder  Goal: Demonstrates orientation to person place and time  Description: Demonstrates orientation to person place and time each day during assessment and interaction    Interventions:  1. Monitor [name]'s orientation status every shift   2. Re-orient the  [name] to environment as needed  Outcome: Progressing  Goal: Conducts goal directed, coherent conversation  Description: [name] will Conducts goal directed, coherent conversation each day with  treatment team by 05/30/22    Interventions:  1. Correct errors of perception in a matter of fact manner   2. Redirect conversations in a calm supportive manner   3. Assess [name]'s conversation every shift  Outcome: Progressing  Goal: Verbalizes reduction in hallucinations/delusions  Description:   [name] will verbalize reduction in hallucinations/delusions by 06/04/22    Interventions:  1. Monitor the presence of hallucinations/delusions every shift   2. Reality test with [name] if [name] able to tolerate  Outcome: Progressing     [name] is a&ox4, presented with constricted affect, good eye contact, speech is clear and coherent but pressured and loud.  [name] reported [name] day was "beautiful" and described mood as "peaceful."  [name] rated anxiety and depression 3/10 (10=worst).  [name] endorsed passive on and off SI but denied current SI.  He denied SIB/HI/AVH and verbalized commitment to safety on the unit.  [name] rated anxiety and depression 3/10 (10=worst).  [name] reported good appetite, decreased energy, and good sleep.  Has been visible in the dayroom interacting with peers and accepted all scheduled meds with PRN Ibuprofen and Vistaril.  [name] has been calm and cooperative and had no acute behavioral or safety issues noted.  Will continue to monitor and support as needed.      "I, Lezlie Octave, hereby attest that I have been made aware of and reviewed the following for this patient:    - Biopsychosocial assessment;  - Diagnoses;  - Nursing needs and medical support, if applicable; and  - Treatment Plan Goals, Objectives and Strategies/Interventions.  I am involved in the care of this patient within my scope of practice and assigned tasks. I approve the treatment plan and this attestation is in lieu of a signature on the document."

## 2022-06-05 NOTE — Progress Notes (Addendum)
SW called [facility] [phone]   TK[facility]SW with [facility]. Record was received.   They will have a bed available on Tuesday. Once they take PT in for intake, they will review the med list.   Fax [phone].   Address is [address] [place] [address]  Tuesday, SW may call [facility] around 10 or 1030 AM. To give update to [place].   Intake takes about an hour, so would want PT there by 1 PM.   Addendum 209 PM  Permission from [facility] to use [facility] for transport of PT to facility.

## 2022-06-05 NOTE — UM Notes (Signed)
[facility]  [address]  [place], [place] [address]  NPI: [id number]  Tax ID: [id number]     Attending:  Dr. Graceann Congress, MD / NPI: [id number]        Inpatient CSR for 05/27/2022 [name] admission     Legal Status:  Voluntary     1/10 Per chart note of NP [facility] So:  - Ativan really helpful last night; slept really well;   - I was kidnapped by [name]  - Showed picture to Dr. 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   - I have a ministry; my family rejected me; mom said they are going to kill me like they did [name]  - I don't hate nobody; are you real like that? They let me go;   - I know I'm sick; God is using my sickness in good ways  - I have my demons  - I live like a rockstar; I'm sick man.   -  Post; I was in there;   - I'm always having nightmares; little pill  - Women have all the power  - I'm still manic as hell  - [name] said rehab man said plenty of beds  - I want to get my psych mental health in order before rehab; I want to be as stable as possible  - I found the program; I wrote it down in my book  - They gotta do the paper work    1/10 Per Nursing note:  He appears disorganized,disheveled,hyper verbal,pressured speech,, appropriate affect ,intact cognition. Pt requested for PRN for sleep  and Vistaril was administered with good effect.      1/10 Per Nursing note:  [name] was A+Ox4 with flat affect and pressured speech.   Pt describes their mood as depressed. Reported good  sleep/energy/appetite.  [name] rated their depression as 5/10 and anxiety as 3/10 (with 10 being the highest possible value).     1/10 Per BH Therapist note:  Group: BH - Wrap Up  Group Topics: Day in Review  Level of Participation: active  Quality of Participation: engaged, offered feedback, redirectable, side talking, and  hypertalkative, giggly  Interactions with Others:  social and hyper verbal  Mood/Affect: bright  Triggers (if applicable): N/A  Cognition: goal directed, religious preoccupation, and tangential  Organization: completed task  Progress: Moderate  Response: Patient appeared to be falling asleep early in the group. He did awaken when called on. He was hyper verbal and giggly, still manic but less so. He talked about the Bible, the Koran, both of which he had with him and his Higher Power. Patient said he didn't go to groups today because he couldn't be in the same room with some people who were discharged so now he could attend groups.     1/10 VS:  T 98.4, HR 75, RR 18, bp 118/67, Pox 97%    1/10 Psychiatric Diagnoses per NP [name]:  F31.64 Bipolar disorder, current episode mixed, severe, with psychotic features    Rule out substance-induced mood disorder   PTSD  Polysubstance use disorder (alcohol, cocaine, benzodiazepines (?)  Rule out malingering (increasingly likely)      1/10 Mental Status Exam per NP [name]:  General appearance: Appears  chronological age, adequate hygiene and grooming; wearing green paper pants; Wearing gray [place]  red/ yellow/ black cap  Attitude/Behavior: Manic, Cooperative, Eye Contact is good;   Motor: No abnormalities noted  Gait: WNL  Muscle strength and tone: Not tested  Speech:  Spontaneous: Yes; hyperverbal; pressured speech; extremely animated  Rate and Rhythm: Normal  Volume: Normal  Tone: Normal  Clarity: Yes at times  Mood: "Manic"  Affect:  Range: Expansive  Stability: Labile  Appropriateness to thought content: Yes   Intensity: High; elevated; Intense  congruent  Thought Process:  Coherent: Yes  Logical:  Yes at times  Associations: Goal-directed; circumstantial; tangential;  Thought Content: religiously preoccupied; black history; black struggle; slavery; black rights   Delusions:  Not elicited,    Depressive Cognitions:  None  Suicidal:  Suicidal  thoughts with intent, but no clear plans; Passive SI  Homicidal:  No homicidal thoughts  Violent Thoughts:  No  Perceptions:  Dissociative Phenomena:  No  Hallucinations: AH  Insight: Adequate - well aware he was depressed then flipped to mania; knows he should not be discharged without a good plan in place; does not want to end up back on the street using drugs; and sleeping w/ random "whores"; and going on a spending spree  Judgment: Fair - going to groups; taking medications; knows his triggers  Cognition:  Level of Consciousness: Intact  Orientation: Intact to self, place and time    1/10 Presenting Mental Status per Nursing:  Orientation Level: return to [facility]  Thought Content: WDL   Thought Process: WDL   Hallucinations: auditory   Attitude: guarded   Mood: depressed   Affect: flat   Speech: pressured   Insight: fair   Judgment: fair   Appetite: increased   Energy: decreased     1/10 Assessment / Plan per MD Dr. Arlyss Repress:  Patient reports that he slept better last night.  Recognized that he is still "manic as hell"  Patient with grandiosity, elevated mood, somewhat hyperreligious, hyperverbal.  Patient agrees to trial Abilify 10 mg qd in lieu of lurasidone given ongoing mania.  Patient reports longstanding hx of PTSD (nightmares, flashbacks, intrusive memories).  Will start clonidine 0.1 mg BID.        1/11 Per chart note of NP Felecia Shelling:  Patient seen and examined in his room. Today, he shares that he is "still really manic" and "not stable to go anywhere else yet". He reports sleeping poorly overnight (despite documentation indicating 9 hours of sleep, patient reports that he woke up at 2 AM and was unable to fall back asleep). He shares that his nighttime medication (clonidine) made him "sick" (nausea, dizziness) and he states that vistaril 100 mg + ativan works best for him. Discussed plan to discontinue ativan prior to discharge and patient verbalized understanding. Patient reports tolerating  Abilify well thus far and he has been selectively attending groups.         Patient continues to verbalize intermittent passive SI, although he currently feels safe in the hospital and he denies any active plans or intent. He additionally reports ongoing AH telling him negative things like "the world is coming to an end, you might as well be dead," etc. He states that he has experienced auditory hallucinations since he was 62 years old, and they have never entirely subsided, even with medication management. Patient identifies his faith as his primary protective factor and motivation for living. He is then hyperverbal and labile as  he shares about religion, his family, his history of trauma, racial discrimination, and his frustrations with capitalism in the [place]. He remains interested in discharging to a dual-diagnosis rehab facility, however he advocates for continued hospitalization at this time for medication management and stability.     1/11 Per Nursing note:  Pt is a&ox4, presented with constricted affect, good eye contact, speech is clear and coherent but pressured and loud.  Pt reported his day was "beautiful" and described mood as "peaceful."  Pt rated anxiety and depression 3/10 (10=worst).  Pt endorsed passive on and off SI but denied current SI.  He denied SIB/HI/AVH and verbalized commitment to safety on the unit.  Pt rated anxiety and depression 3/10 (10=worst).  Pt reported good appetite, decreased energy, and good sleep.     1/11 Psychiatric Diagnoses per NP [facility] So:  F31.64 Bipolar disorder, current episode mixed, severe, with psychotic features    Rule out substance-induced mood disorder   PTSD  Polysubstance use disorder (alcohol, cocaine, benzodiazepines (?)  Rule out malingering (increasingly likely)      1/11 Mental Status Exam per NP Felecia Shelling:  General appearance: Appears chronological age, adequate hygiene and grooming; wearing green paper pants; Wearing red scrub top, red/  yellow/ black cap  Attitude/Behavior: Cooperative, Eye Contact is good;   Motor: No abnormalities noted  Gait: WNL  Muscle strength and tone: Not tested  Speech:  Spontaneous: Yes; hyperverbal  Rate and Rhythm: increased  Volume: Normal  Tone: Normal  Clarity: Yes at times  Mood: "really manic"  Affect:  Range: Expansive  Stability: Labile  Appropriateness to thought content: Yes   Intensity: normal  congruent  Thought Process:  Coherent: Yes  Logical:  Yes at times  Associations: Goal-directed; circumstantial; tangential;  Thought Content: religiously preoccupied; black history; black struggle; slavery; black rights   Delusions:  Not elicited    Depressive Cognitions:  None  Suicidal:  Suicidal thoughts but no clear plans; Passive SI  Homicidal:  No homicidal thoughts  Violent Thoughts:  No  Perceptions:  Dissociative Phenomena:  No  Hallucinations: AH  Insight: Adequate - knows he should not be discharged without a good plan in place; recognized benefit of continued monitoring for stability and medication management, does not want to end up back on the street using drugs;   Judgment: Fair - going to groups; taking medications; knows his triggers  Cognition:  Level of Consciousness: Intact  Orientation: Intact to self, place and time    1/11 Assessment / Plan per NP Felecia Shelling:  On evaluation today, patient is somewhat hyperverbal and hyperreligious and he endorses poor sleep overnight. He reports ongoing AH along with passive SI but he currently feels safe in the hospital. Tolerating Abilify well although has expressed minimal improvement in symptoms. Requests to discontinue clonidine because he experienced nausea and dizziness last night. Patient remains interested in rehab upon discharge when stable. Will discontinue clonidine and increase Abilify to 15 mg daily.      1/10-1/11 Scheduled and PRN Psych Meds:  Abilify 10mg  po daily, start 1/10-->15mg  po daily (1/12)  Catapres 0.1mg  po qHS, start 1/10, [place]'d  1/11  Ativan 1mg  po qHS, start 1/9  Latuda 40mg  po daily after dinner-->60mg  po daily after dinner (1/9)-->[place]'d 1/10  Vistaril 100mg  po qHS prn (rec'd 1 dose on 1/10)     1/10-1/11 Treatment plan:  Activity as tolerated  Close monitoring q 70min checks  VS bid  Medication management and stabilization  Group/individual therapy  Therapeutic Recreation eval and treat  D/c planning                Rowan Blase, BSN, RN, [employer], ACM-RN          Utilization Review   [facility]  [address]  [address]  [place], [place] [address]  Phone: [phone] (VM)  [place]: [phone]  Fax: [phone]

## 2022-06-05 NOTE — Plan of Care (Signed)
Problem: At Risk for Suicide AS EVIDENCED BY...  Goal: Patient will remain safe during hospitalization  Outcome: Progressing  Goal: Suicide Alert Level Moderate  Outcome: Progressing  Goal: Identifies triggers and protective strategies  Outcome: Progressing     Patient is A&Ox4, presented with flat affect, good eye contact, clear, coherent, loud speech.  Pt described his day as "blessed" his mood as "calm."  Pt rated his anxiety and depression a 7/10 and 3/10 respectfully, explaining that he is "anxiously" waiting to talk to his doctor. Pt endorsed passive on and off SI but denied any intent or plan - said he feels safe here. He denied SIB/HI/AVH and verbalized commitment to safety on the unit.  Has been visible in the dayroom interacting with peers; playful, cracked jokes with peers, played the guitar. Cooperative with care - accepted all scheduled meds and requested/received PRN Ibuprofen and Tylenol for pain. He participated in group.  No safety issues noted.  Will continue to monitor and support as needed.       "I, Harley Hallmark, hereby attest that I have been made aware of and reviewed the following for this patient:     - Biopsychosocial assessment;  - Diagnoses;  - Nursing needs and medical support, if applicable; and  - Treatment Plan Goals, Objectives and Strategies/Interventions.     I am involved in the care of this patient within my scope of practice and assigned tasks. I approve the treatment plan and this attestation is in lieu of a signature on the document."

## 2022-06-06 MED ORDER — ARIPIPRAZOLE 10 MG PO TABS
20.0000 mg | ORAL_TABLET | Freq: Every day | ORAL | Status: DC
Start: 2022-06-07 — End: 2022-06-11
  Administered 2022-06-07 – 2022-06-11 (×5): 20 mg via ORAL
  Filled 2022-06-06 (×5): qty 2

## 2022-06-06 NOTE — Progress Notes (Signed)
[facility]   Recreational Therapy Progress Note     Patient Name: [name]    MRN:  [id number]  Age: 62 y.o.  DOB: 1960-12-25   Unit: [facility] [address]    Bed: P631/P631.01             Subjective   Pt is agreeable to participation in the therapy session.    "I do everything in threes".  Objective   [name] is a 62 y.o. male admitted on 05/27/2022 with Bipolar 1 disorder [F31.9]  Mania [F30.9].      Name of Group: Recreation Therapy  RT Intervention: Leisure and Benefits  Purpose: To increase awareness of the benefits for participating in leisure.  Group offered:  Pt arrived to group late. Pt actively listened in group discussion about types of leisure and benefits of participating in leisure. Next, TW passed out a worksheet titled "Leisure and Benefits". Pt were instructed to write one activity in the right column that would help receive the benefit in the left column. Pt was observed writing in his Bible and not doing the group activity. Pt made racial remarks and needed redirection from staff. Continued recreation therapy services are recommended for pt to increase leisure awareness, increase socialization, and develop coping skills.     Group benefits: improve coping behavior, improve general psychological health, improve self-control, improve social skills, socialization, cooperation and interpersonal interactions, increase self-concept and self-esteem, increase life and leisure satisfaction and perceived quality of life, and increase leisure awareness and knowledge of community resources    RT Group Participation:  arrived late   dependent- does not indicate desire to participate and/or initiate activity  Appearance: casually dressed  Mood: irritable  Affect: angry  Behavior: Guarded  Thought Content: religious  Thought Process: blocked  Speech: loud  Attention Span: Difficulty attending to directions  Memory: UTA=Unable to  assess  Following Commands: max verbal instruction  Problem solving and judgement: Fair  Impulse control: Impaired  Participation: poor  Gait/movement: steady and coordinated  Goals:   RT goal:   Prior to d/c, pt will attend and participate in a min. of 75% of RT sessions.       Signed By: Selina Cooley, [place]

## 2022-06-06 NOTE — Progress Notes (Signed)
Psychiatry Progress Note  (Level 1 = Problem Focused, Level 2 = Expanded Problem Focused, Level 3 = Detailed)    Patient Name: [name]            Current Date/Time:  1/12/20243:44 PM  MRN:  [id number]                            Attending Physician: Jeri Cos, MD  DOB: 1960/07/17                                Gender: male                              I. History   Informants: patient, chart review, clinical staff  A. Chief Complaint or Reason for Admission  "I have PTSD and Bipolar and I have been off of my medications for two weeks. I have a history of cutting myself and I have been having feeling hopeless and suicidal"   Admitted for SI and CAH.     B.History of Present Illness: Interval History     (Symptoms and qualifiers:1 for Level 1, 2 for Level 2 and 3+ for Level 3)  [name] is a 62 year old domiciled, employed, single man with past medical hx of arthritis and past psychiatric hx of PTSD, bipolar I disorder, and polysubstance use who presented with worsening depression, and SI in the context of medication nonadherence, recent alcohol and cocaine use, and social isolation.        INTERVAL HISTORY: June 06, 2022    Patient seen and examined. Today, he reports that he slept a little better last night, waking up around 3-4 AM and he shares that he is tolerating the increased dose of Abilify well without reporting adverse effects. He is hyperverbal and difficult to interrupt as he speaks about various topics ranging from recurrent SI (although currently feels safe on the unit and denies active plan/intent) to his history of balanitis and his requests for cortisone cream, to his relationship with his mother and his fears of dying alone, interspersed with racial and gendered remarks. He endorses chronic AH. He is agreeable to further increasing Abilify for mood stabilization. He remains interested in transitioning to a rehab facility upon discharge.     C.Medical History  Review of  Systems  (Level 1 is none, Level 2 is 1, and Level 3 is 2+)  A complete 14 point ROS was done and was negative except for:   Psychiatric: see HPI   Constitutional: See HPI  Genitourinary: reports irritation to the tip of his penis associated with balanitis.      D.Additional Past Psychiatric, Substance Use, Medical, Family and Social History  Psychiatric: No new information available as compared to previous encounter(s)  Substance Use: No new information available as compared to previous encounter(s)  Medical: No new information available as compared to previous encounter(s)  Family: No new information available as compared to previous encounter(s)  Social: No new information available as compared to previous encounter(s)    Medications:   Current Medications       Scheduled       Medication Ordered Dose/Rate, Route, Frequency Last Action    ARIPiprazole (ABILIFY) tablet 20 mg 20 mg, PO, Daily Ordered    folic acid (FOLVITE) tablet 1 mg 1  mg, PO, Daily Given, 1 mg at 01/05 0839    LORazepam (ATIVAN) tablet 1 mg 1 mg, PO, QHS Given, 1 mg at 01/11 2128    multivitamin tablet 1 tablet 1 tablet, PO, Daily Given, 1 tablet at 01/05 0839    nystatin (NYSTOP) powder No Dose/Rate, TP, BID Given, No Dose/Rate at 01/11 0914              PRN       Medication Ordered Dose/Rate, Route, Frequency Last Action    acetaminophen (TYLENOL) tablet 650 mg 650 mg, PO, Q6H PRN Given, 650 mg at 01/12 1226    alum & mag hydroxide-simethicone (MAALOX PLUS) 200-200-20 mg/5 mL suspension 30 mL 30 mL, PO, Q6H PRN Ordered    benztropine (COGENTIN) tablet 1 mg (Or Linked Group #1) 1 mg, PO, Once PRN Ordered    benztropine mesylate (COGENTIN) injection 2 mg (Or Linked Group #1) 2 mg, IM, Once PRN Ordered    docusate sodium (COLACE) capsule 100 mg 100 mg, PO, BID PRN Ordered    hydrOXYzine (VISTARIL) capsule 100 mg 100 mg, PO, QHS PRN Given, 100 mg at 01/11 2128    hydrOXYzine (VISTARIL) capsule 50 mg 50 mg, PO, Q4H PRN Given, 50 mg at 01/08 0943     ibuprofen (ADVIL) tablet 800 mg 800 mg, PO, Q8H PRN Given, 800 mg at 01/12 0907    loperamide (IMODIUM) capsule 2 mg 2 mg, PO, Q3H PRN Ordered    OLANZapine (ZyPREXA) tablet 10 mg 10 mg, PO, BID PRN Given, 10 mg at 01/02 0840    OLANZapine (ZyPREXA) tablet 5 mg 5 mg, PO, QHS PRN Ordered    ondansetron (ZOFRAN) tablet 4 mg 4 mg, PO, Q8H PRN Given, 4 mg at 01/06 1150                    II. Examination   Vital signs reviewed:   Blood pressure 106/63, pulse 86, temperature 97.9 F (36.6 C), temperature source Oral, resp. rate 20, SpO2 97 %.     Mental Status Exam  (Level 1 is 1-5, Level 2 is 6-8, Level 3 is 9+)  General appearance: Appears chronological age, adequate hygiene and grooming; wearing personal clothing  Attitude/Behavior: Cooperative, Eye Contact is good  Motor: Psychomotor agitation (left leg is shaking throughout this evaluation)  Gait: WNL  Muscle strength and tone: Not tested  Speech:  Spontaneous: Yes; hyperverbal  Rate and Rhythm: increased  Volume: Normal  Tone: Normal  Clarity: Yes at times  Mood: "still a bit manic'"  Affect:  Range: Expansive  Stability: Labile  Appropriateness to thought content: Yes   Intensity: normal  congruent  Thought Process:  Coherent: Yes  Logical:  Yes at times  Associations: Goal-directed; circumstantial; tangential  Thought Content:   Delusions:  Not elicited    Depressive Cognitions:  None  Suicidal:  Suicidal thoughts but no clear plans; Passive SI  Homicidal:  No homicidal thoughts  Violent Thoughts:  No  Perceptions:  Dissociative Phenomena:  No  Hallucinations: AH  Insight: Adequate - knows he should not be discharged without a good plan in place; recognized benefit of continued monitoring for stability and medication management, does not want to end up back on the street using drugs;   Judgment: Fair - going to groups; taking medications; knows his triggers  Cognition:   Level of Consciousness: Intact  Orientation: Intact to self, place and time     Psychiatric /  Cognitive Instruments:  None     Pertinent Physical Exam: Not relevant to current chief complaint/reason for admission    Imaging / EKG / Labs: Labs in the last 24 hours  Results       ** No results found for the last 24 hours. **          III. Assessment and Plan (Medical Decision Making)     1. I certify that this patient continues to require inpatient hospitalization due to acute risk to self    2. Psychiatric Diagnoses  Bipolar 1 Disorder, current episode depressed, severe, with psychotic features  Rule out substance-induced mood disorder   PTSD  Polysubstance use disorder (alcohol, cocaine, benzodiazepines (?))  Rule out malingering (increasingly likely)      3.All diagnostic procedures completed since admission were reviewed. Past medical records reviewed. Coordination of care was discussed with inpatient team and as available with the outpatient team.     4. On this admission patient educated about and provided input into their treatment plan.  Patient understands potential risks and benefits of proposed treatment plan.      5.  Assessment / Impression  Patient is a 61 y.o. domiciled, employed, single male with PMH of arthritis psychiatric history of Bipolar I disorder, PTSD, and polysubstance use admitted for worsening depression and suicidal ideation in the context of medication non-adherence, social isolation, and inadequate access to care. Patient is admitted on a voluntary basis.     On evaluation today, patient reports improved sleep but he presents with grandiosity, disinhibited comments about race and gender, and a tangential thought process. He remains hyperverbal and difficult to interrupt. He is tolerating Abilify well and agreeable to further increasing the dose from 15 mg to 20 mg daily. He reports ongoing passive SI and AH but he currently feels safe in the hospital and he remains interested in rehab upon discharge when stable.      At this time, the patient would continue to benefit from  inpatient psychiatric hospitalization for safety, stabilization, diagnostic clarification, medication management, and discharge planning.     Suicide Risk Assessment   Suicide Risk Assessment performed? Yes: Suicide Thoughts / Behaviors: None  Current Plan: None  Access to firearm: No  Past suicide attempts: Yes  Diagnosis of MDD, BPAD, Schizophrenia, Cluster B PD, Anorexia, Substance Use: Yes, Bipolar Depression versus substance-induced mood disorder.     Plan / Recommendations:     Biological Plan:  Medications:   PRN Vistaril 100 mg po Q4H; Zofran 4 mg PRN  Continue Ativan 1.0 QHS for sleep and mania; discussed will not Rx on discharge  Increase Abilify to 20 mg po daily      Medical Work-up: Reached out to unit FNP in reference to irritation reported to tip of penis associated with balanitis.   Consults: None required at this time     Psychosocial Plan:  Individual therapy: Psycho-education and psychotherapy of the following modalities will continue to be provided during daily visits:Supportive and Cognitive Behavioral Therapy  Group and milieu therapies: daily per unit's schedule.  Continue with Social Work intervention provided for discharge planning including assistance with placement, case management referral, and follow-up psychiatric care.     Plan for Family Involvement: Patient Refusing  Other Providers Contact Information and Dates Contacted: No Updates    I personally saw and examined the patient and spent 35 minutes on this visit reviewing previous notes, counseling the patient and/or family members regarding the patient's condition(s), ordering of tests, managing  medications, and documenting the findings in the note.   Patient was also seen by Dr. Merlinda Frederick. The assessment and plan were discussed and formulated together and has been reviewed and updated as needed.    Signed by: Eldridge Dace, NP  06/06/2022  3:44 PM

## 2022-06-06 NOTE — Plan of Care (Signed)
Problem: At Risk for Suicide AS EVIDENCED BY...  Goal: Will identify long and short term goals based on individual needs and strengths  Description: [name] will identify 2 long and 2 short term goals based on individual needs and strengths by 06/04/22    Interventions:  1. Assist [name] to identify long term recovery goal  2. Assist [name] to identify short term goal(s) for treatment   3. Assist  [name] to define criteria for discharge  4. Engage [name] in developing initial treatment plan and signing acknowledgement     Outcome: Progressing  Goal: Patient will remain safe during hospitalization  Description: [name] will remain safe during hospitalization    Interventions:  1. Complete suicide risk assessment at admission, PRN, and prior to discharge  2. Initiate Suicide Alert Level safety interventions as indicated  3. Reassess suicidal ideation every 12 hrs  06/06/2022 1557 by Janett Labella, RN  Outcome: Progressing  06/06/2022 1557 by Janett Labella, RN  Outcome: Progressing  Goal: Suicide Alert Level Moderate  Description: Interventions:  1. Every 15 minute in person checks  2. Educate [name] to report increase in suicidal ideation or concerns about safety  3. Reassess suicidal ideation every 12 hrs  4. Complete in-hospital safety plan  5. Order safety tray for meals as indicated   6. Room search for dangerous items per LIP order as indicated  7. Mouth checks following medication administration as indicated  8. Monitor use of bathroom as indicated  9. Assign room close to team station as indicated       06/06/2022 1557 by Janett Labella, RN  Outcome: Progressing  06/06/2022 1557 by Janett Labella, RN  Outcome: Progressing  Goal: Identifies triggers and protective strategies  Description: [name] will Identify 3 triggers and 3 protective strategies by 06/04/22    Interventions:  1. Assist [name] to identify events preceding or prompting suicidal ideation  2. Assist [name] to identify distracting  activities, thoughts, or calming techniques  3. Assist [name] to identify personal support network    4. Assist [name] to identify professionals, or organizations to contact for help     06/06/2022 1557 by Janett Labella, RN  Outcome: Progressing  06/06/2022 1557 by Janett Labella, RN  Outcome: Progressing  Goal: Verbalizes understanding of medication, benefits, and side effects  Description: [name] will verbalize understanding of medication, benefits, and side effects of each of his medications when received.     Interventions:  1. Ensure Informed consent for all psychotropic medications   2. Provide medication teaching including name, dosage, benefits, action, effect and side effects   3. Educate [name]  to report response to medications including side effects  06/06/2022 1557 by Janett Labella, RN  Outcome: Progressing  06/06/2022 1557 by Janett Labella, RN  Outcome: Progressing  Goal: Identify and participate in supportive program therapies  Description: [name] will identify and participate in 3 supportive program therapies each day by 06/04/22    Interventions:  1. Assist [name] to target therapy groups that support individual's treatment goals  2. Assist to [facility]  identify personal skill development or activity goal  3. Encourage [name] attendance and reinforce small successes in participation    06/06/2022 1557 by Janett Labella, RN  Outcome: Not Progressing  06/06/2022 1557 by Janett Labella, RN  Outcome: Progressing  Goal: Completes discharge safety and recovery plan  Description: Interventions:  1. Initial evaluation of discharge needs  2. Establish transitional plan to next level of care with follow up appointment date   3.  Involve the pVincent's family/support person regarding discharge planning process   4. Identify community resources to support discharge planning   5. Complete suicide risk assessment prior to discharge   6. Address availability of firearms in home environment with family  and [name]  7. Facilitate completion of discharge safety plan  06/06/2022 1557 by Janett Labella, RN  Outcome: Not Progressing  06/06/2022 1557 by Janett Labella, RN  Outcome: Progressing     Problem: Thought Disorder  Goal: Demonstrates orientation to person place and time  Description: Demonstrates orientation to person place and time each day during assessment and interaction    Interventions:  1. Monitor [name]'s orientation status every shift   2. Re-orient the  [name] to environment as needed  06/06/2022 1557 by Janett Labella, RN  Outcome: Progressing  06/06/2022 1557 by Janett Labella, RN  Outcome: Progressing  Goal: Conducts goal directed, coherent conversation  Description: [name] will Conducts goal directed, coherent conversation each day with treatment team by 05/30/22    Interventions:  1. Correct errors of perception in a matter of fact manner   2. Redirect conversations in a calm supportive manner   3. Assess [name]'s conversation every shift  06/06/2022 1557 by Janett Labella, RN  Outcome: Progressing  06/06/2022 1557 by Janett Labella, RN  Outcome: Progressing  Goal: Verbalizes reduction in hallucinations/delusions  Description:   [name] will verbalize reduction in hallucinations/delusions by 06/04/22    Interventions:  1. Monitor the presence of hallucinations/delusions every shift   2. Reality test with [name] if [name] able to tolerate  06/06/2022 1557 by Janett Labella, RN  Outcome: Progressing  06/06/2022 1557 by Janett Labella, RN  Outcome: Progressing     Problem: Mood Disorder  Goal: Reports improved mood  Description: [name] reports improved mood by 06/04/22    Interventions:  1. Encourage the [name] to verbalize feelings of anxiety, anger, and fears   2. Frequent brief 1:1 conversations with the [facility] to assess mood /coping response  06/06/2022 1557 by Janett Labella, RN  Outcome: Progressing  06/06/2022 1557 by Janett Labella, RN  Outcome: Progressing     Problem:  Safety  Goal: Patient will be free from injury during hospitalization  Description: [name] will be free from injury during hospitalization      Interventions:  1. Assess [name]'s risk for falls and implement fall prevention plan of care per policy      2. Provide and maintain safe environment   3. Use appropriate transfer methods   4. Ensure appropriate safety devices are available at the bedside   5. Include [name]/ family/ care giver in decisions related to safety   6. Hourly rounding   7. Assess for vincents risk for elopement and implement Elopement Risk Plan per policy   8. Provide alternative method of communication if needed [employer], writing)  06/06/2022 1557 by Janett Labella, RN  Outcome: Progressing  06/06/2022 1557 by Janett Labella, RN  Outcome: Progressing  Goal: Patient will be free from infection during hospitalization  Description: Ahmir  will be free from infection during hospitalization    Interventions:  1. Assess and monitor for signs and symptoms of infection  2. Monitor lab/diagnostic results  3. Monitor all insertion sites (i.e. indwelling lines, tubes, urinary catheters, and drains)  4. Encourage Marcelino and family to use good hand hygiene technique  06/06/2022 1557 by Janett Labella, RN  Outcome: Progressing  06/06/2022 1557 by Janett Labella, RN  Outcome: Progressing     Problem: Pain  Goal:  Pain at adequate level as identified by patient  Description: Pain at adequate level as identified by [name]    Interventions:  1. Identify [place] comfort function goal  2. Evaluate if [facility] comfort function goal is met  3. Assess pain on admission, during daily assessment and/or before any "as needed" intervention(s)  4. Reassess pain within 30-60 minutes of any procedure/intervention, per Pain Assessment, Intervention, Reassessment (AIR) Cycle  5. Evaluate Erby's satisfaction with pain management progress  6. Offer non-pharmacological pain management  interventions  7. Consult/collaborate with Pain Service  8. Consult/collaborate with Physical Therapy, Occupational Therapy, and/or Speech Therapy  9. Assess for risk of opioid induced respiratory depression and side effects, including snoring/sleep apnea. Alert healthcare team of risk factors identified.  10. Include Abdurahman in decisions related to pain management as needed  06/06/2022 1557 by Jaynie Crumble, RN  Outcome: Progressing  06/06/2022 1557 by Jaynie Crumble, RN  Outcome: Progressing     Problem: Pain interferes with ability to perform ADL  Goal: Pain at adequate level as identified by patient  Description: Interventions:  1. Identify patient comfort function goal  2. Evaluate if patient comfort function goal is met  3. Assess pain on admission, during daily assessment and/or before any "as needed" intervention(s)  4. Reassess pain within 30-60 minutes of any procedure/intervention, per Pain Assessment, Intervention, Reassessment (AIR) Cycle  5. Evaluate patient's satisfaction with pain management progress  6. Offer non-pharmacological pain management interventions  7. Consult/collaborate with Pain Service  8. Consult/collaborate with Physical Therapy, Occupational Therapy, and/or Speech Therapy  9. Assess for risk of opioid induced respiratory depression and side effects, including snoring/sleep apnea. Alert healthcare team of risk factors identified.  10. Include patient/patient care companion in decisions related to pain management as needed  06/06/2022 1557 by Jaynie Crumble, RN  Outcome: Progressing  06/06/2022 1557 by Jaynie Crumble, RN  Outcome: Progressing     Problem: Side Effects from Pain Analgesia  Goal: Patient will experience minimal side effects of analgesic therapy  Description: Interventions:  1. Monitor/assess patient's respiratory status (RR depth, effort, breath sounds)  2. Assess for changes in cognitive function   3. Prevent/manage side effects per LIP orders (i.e. nausea,  vomiting, pruritus, constipation, urinary retention, etc.)  4. Evaluate for opioid-induced sedation with appropriate assessment tool (i.e. POSS)  06/06/2022 1557 by Jaynie Crumble, RN  Outcome: Progressing  06/06/2022 1557 by Jaynie Crumble, RN  Outcome: Progressing   Charles was A+Ox4 with flat affect and pressured loud speech. Denies SI/HI/AVH/SIB urges. BVC is 0. Pt states they are committed to maintaining their safety, encouraged to come to staff if thoughts or urges to harm self or others increases, Denise verbalizes understanding. Pt is cooperative with Clinical research associate, able to answer questions and maintain  eye contact. Pt describes their mood as depressed. Reported poor sleep/energy/appetite.  Cayne rated their depression as 5/10 and anxiety as 4/10 (with 10 being the highest possible value). Cael states their goal for today is speak to MD about "this" medication. Rontavious is selectively compliant with medication administration and mouth check completed. Staley was visible in the milieu throughout the shift participated in groups with peers and staff appropriately. Q15 min safety checks maintained. Will continue to monitor and ensure safety.  PRN motrin, Tylenol and vistaril administered with minimal relief.        "I, Jillian Pianka Macon Large RN, hereby attest that I have been made aware of and reviewed the following for this patient:    - Biopsychosocial assessment;  -  Diagnoses;  - Nursing needs and medical support, if applicable; and  - Treatment Plan Goals, Objectives and Strategies/Interventions.    I am involved in the care of this patient within my scope of practice and assigned tasks. I approve the treatment plan and this attestation is in lieu of a signature on the document."

## 2022-06-06 NOTE — Group Note (Signed)
[facility] - Art Therapy  Inpatient Behavioral Health Group Note    Patient Name:  [name]       Medical Record Number: [id number]   Date of Birth: 1960-11-15  Sex: Male          Rehab Diagnosis: Bipolar 1 disorder [F31.9];Mania [F30.9]     Today's Date: 06/05/2022   Group Start Time: 1500   Group End Time: 1600   Group Facilitators: Wyatt Mage  Group Topic: BH - Art Therapy  Group members were engaged with art therapy intervention of quote's art response with the theme of self-compassion. After a brief discussion, group members were to choose a quote which resonates with and make an art response based on the quote. This intervention aims to promote cognitive processes, self-acceptance, empowerment and self-expression.        Patient's response to treatment:    Participation : Full  Appearance: Neat  Mood: Euthymic   Affect: Calm  Behavior: Cooperative, Attentive, and Sociable  Thought Content: Others. Not able to assess.  Thought Process: Others. Not able to assess.  Speech: Normal    [name] expressed no interest in art but was eager to play music during the group. He was calm when the writer told him that we need to seek the group consent.     He stayed in the group listening to the music after group members agreed to the music he likes to play. He was noticed listening to the music with his eyes closed and moving his body at times. When a peer shared his artwork related to "love" , he responded "God is love, Amen".    Overall, he was calm and appropriate.    Recommendation:  To continue art therapy engagement in order to promote  self- expression, stress relief, mindfulness, social skills, and establish positive coping skills for symptom management.      Signed by:   Audelia Acton, Araya Roel Nickola Major , [place] Department

## 2022-06-06 NOTE — Nursing Progress Note (Signed)
Interdisciplinary Treatment Plan Update Meeting    06/06/2022  Karl Bales Pasch    Participants:  Patient:  [name]  Attending Physician:  Jeri Cos, MD  RN: [place] Therapist:   Social Worker:     Short Term Goal:Get on Medication regimen that helps stabilize mood,abstain from substance abuse.    Long Term Goal: Stable housing and consistency with after care.    Objective:  Review response to treatment, reassess needs/goals, update plan as indicated incorporating patient's strengths and stated needs, goals, and preferences.      1. Summary of Patient Progress on Treatment Plan Goals:  Interdisciplinary treatment plan meeting was held with patient.Short term and long term goals was reviewed With the patient.[name] verbalized understanding.The treatment team will continue to work with patient for a safe discharged.    2. Level of Patient Involvement:  Actively engaged/contributing    3. Patient Understanding of Plan of Care:  Able to verbalize goals and interventions    4. Level of Agreement/Commitment to Plan of Care:  Agrees with and is committed to plan of care          Contributor Signatures:      MD_________________________________ Date___________________    SW_________________________________Date ___________________    RN _________________________________Date____________________    Patient_______________________________Date____________________    Other________________________________Date ___________________    Other________________________________Date ___________________    (This document is signed electronically by Probation officer and electronic co-signer.  Other participants sign a printed copy which is scanned into the EMR)

## 2022-06-06 NOTE — Progress Notes (Signed)
Karl Bales Dolney  61 y.o.  P631/P631.01  male  Active Hospital Problems    Diagnosis    Mania     Legal Status: Voluntary  Vitals:    06/05/22 1929   BP: 120/75   Pulse: 83   Resp: 20   Temp: 97.7 F (36.5 C)   SpO2: 98%     Blood Sugar Level: N/A  CIWA Score: N/A  COWS Score:N/A  Pain Score: 10/10  Pain Location: knee pain  Falls Risk Score: - LOW  Falls Risk Reason: None  Affect: constricted  Mood normal/playful  Hallucinations: denies  Suicidal: No suicidal thoughts  Suicide Alert Level: Routine monitoring  Homicidal: No homicidal thoughts  Depression Level: 3/10  Anxiety Level: 3/10  Sleep Quality:Difficulty falling asleep  Sleep Hours:7.5hrs  Energy: Energy level is good.  The patient is able to perform most usual functions.  Appetite: normal  NPO Status: N/A  ADLs: Independent with all self care tasks.  Hygiene: disheveled  Date of Last Bowel Movement: today  Number of Groups Attended: 3  Medication/Medication Compliance: accepted  Cheeking Medication: No    LAR/Meds Over Objection: No    Medication Side Effects: none  DVT Prophylaxis: None needed  Compliant with Blood draws/Labs: compliant  Compliant with Vitals: compliant  Elopement risk: No    Sexual aggression: No      Acute Events Overnight:    NONE

## 2022-06-07 MED ORDER — HYDROCORTISONE 1 % EX CREA
TOPICAL_CREAM | Freq: Two times a day (BID) | CUTANEOUS | Status: DC
Start: 2022-06-07 — End: 2022-06-11
  Filled 2022-06-07 (×2): qty 28.4
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
## 2022-06-07 NOTE — Progress Notes (Signed)
Psychiatry Progress Note  (Level 1 = Problem Focused, Level 2 = Expanded Problem Focused, Level 3 = Detailed)    Patient Name: [name]            Current Date/Time:  1/13/20245:33 PM  MRN:  [id number]                            Attending Physician: Farris Has, MD  DOB: 04-25-1961                                Gender: male                              I. History   Informants: patient, chart review, clinical staff  A. Chief Complaint or Reason for Admission  "I have PTSD and Bipolar and I have been off of my medications for two weeks. I have a history of cutting myself and I have been having feeling hopeless and suicidal"   Admitted for SI and CAH.     B.History of Present Illness: Interval History     (Symptoms and qualifiers:1 for Level 1, 2 for Level 2 and 3+ for Level 3)  Mr. Arment is a 62 year old domiciled, employed, single man with past medical hx of arthritis and past psychiatric hx of PTSD, bipolar I disorder, and polysubstance use who presented with worsening depression, and SI in the context of medication nonadherence, recent alcohol and cocaine use, and social isolation.        INTERVAL HISTORY: June 07, 2022    Patient is found in his room at time of assessment. He remains tangential and hyperverbal moving from topic to topic in rapid succession.  He is upset at the visit time stating that he is "angry" that the cortisone cream requested yesterday for his athlete's foot was not given to him.  This Clinical research associate informed patient that it would be ordered following the visit to which he was amenable.  He then shows this Clinical research associate unprompted his foot and rash.  He states intention to attend groups and participate in his treatment and recovery.  He denies SI, HI, AVH, SIB at time of visit.    C.Medical History  Review of Systems  (Level 1 is none, Level 2 is 1, and Level 3 is 2+)  Review of Systems   Skin:  Positive for rash.   Psychiatric/Behavioral:  Positive for depression.             D.Additional Past Psychiatric, Substance Use, Medical, Family and Social History  Psychiatric: No new information available as compared to previous encounter(s)  Substance Use: No new information available as compared to previous encounter(s)  Medical: No new information available as compared to previous encounter(s)  Family: No new information available as compared to previous encounter(s)  Social: No new information available as compared to previous encounter(s)    Medications:   Current Medications       Scheduled       Medication Ordered Dose/Rate, Route, Frequency Last Action    ARIPiprazole (ABILIFY) tablet 20 mg 20 mg, PO, Daily Given, 20 mg at 01/13 0856    folic acid (FOLVITE) tablet 1 mg 1 mg, PO, Daily Given, 1 mg at 01/05 0839    hydrocortisone (CORTAID) 1 % cream No Dose/Rate, TP, BID Given,  No Dose/Rate at 01/13 1043    LORazepam (ATIVAN) tablet 1 mg 1 mg, PO, QHS Given, 1 mg at 01/12 2141    multivitamin tablet 1 tablet 1 tablet, PO, Daily Given, 1 tablet at 01/05 0839    nystatin (NYSTOP) powder No Dose/Rate, TP, BID Given, No Dose/Rate at 01/13 0857              PRN       Medication Ordered Dose/Rate, Route, Frequency Last Action    acetaminophen (TYLENOL) tablet 650 mg 650 mg, PO, Q6H PRN Given, 650 mg at 01/13 1427    alum & mag hydroxide-simethicone (MAALOX PLUS) 200-200-20 mg/5 mL suspension 30 mL 30 mL, PO, Q6H PRN Ordered    benztropine (COGENTIN) tablet 1 mg (Or Linked Group #1) 1 mg, PO, Once PRN Ordered    benztropine mesylate (COGENTIN) injection 2 mg (Or Linked Group #1) 2 mg, IM, Once PRN Ordered    docusate sodium (COLACE) capsule 100 mg 100 mg, PO, BID PRN Ordered    hydrOXYzine (VISTARIL) capsule 100 mg 100 mg, PO, QHS PRN Given, 100 mg at 01/12 2141    hydrOXYzine (VISTARIL) capsule 50 mg 50 mg, PO, Q4H PRN Given, 50 mg at 01/13 1427    ibuprofen (ADVIL) tablet 800 mg 800 mg, PO, Q8H PRN Given, 800 mg at 01/13 0855    loperamide (IMODIUM) capsule 2 mg 2 mg, PO, Q3H PRN Ordered     OLANZapine (ZyPREXA) tablet 10 mg 10 mg, PO, BID PRN Given, 10 mg at 01/02 0840    OLANZapine (ZyPREXA) tablet 5 mg 5 mg, PO, QHS PRN Ordered    ondansetron (ZOFRAN) tablet 4 mg 4 mg, PO, Q8H PRN Given, 4 mg at 01/06 1150                    II. Examination   Vital signs reviewed:   Blood pressure 113/65, pulse 68, temperature 98.1 F (36.7 C), temperature source Oral, resp. rate 16, SpO2 100 %.     Mental Status Exam  (Level 1 is 1-5, Level 2 is 6-8, Level 3 is 9+)  General appearance: Appears chronological age, adequate hygiene and grooming; wearing personal clothing  Attitude/Behavior: Cooperative, Eye Contact is good  Motor: Psychomotor agitation   Gait: WNL  Muscle strength and tone: Not tested  Speech:  Spontaneous: Yes; hyperverbal  Rate and Rhythm: increased  Volume: Normal  Tone: Normal  Clarity: Yes at times  Mood: "angry"  Affect:  Range: Expansive  Stability: Labile  Appropriateness to thought content: Yes   Intensity: normal  congruent  Thought Process:  Coherent: Yes  Logical:  Yes at times  Associations: Goal-directed; circumstantial; tangential  Thought Content:   Delusions:  Not elicited    Depressive Cognitions:  None  Suicidal:  No suicidal thoughts  Homicidal:  No homicidal thoughts  Violent Thoughts:  No  Perceptions:  Dissociative Phenomena:  No  Hallucinations: AH  Insight: Adequate  Judgment: Fair   Cognition:   Level of Consciousness: Intact  Orientation: Intact to self, place and time     Psychiatric / Cognitive Instruments: None     Pertinent Physical Exam: Not relevant to current chief complaint/reason for admission    Imaging / EKG / Labs: Labs in the last 24 hours  Results       ** No results found for the last 24 hours. **          III. Assessment and Plan (Medical Decision  Making)     1. I certify that this patient continues to require inpatient hospitalization due to acute risk to self    2. Psychiatric Diagnoses  Bipolar 1 Disorder, current episode depressed, severe, with psychotic  features  Rule out substance-induced mood disorder   PTSD  Polysubstance use disorder (alcohol, cocaine, benzodiazepines (?))  Rule out malingering (increasingly likely)      3.All diagnostic procedures completed since admission were reviewed. Past medical records reviewed. Coordination of care was discussed with inpatient team and as available with the outpatient team.     4. On this admission patient educated about and provided input into their treatment plan.  Patient understands potential risks and benefits of proposed treatment plan.      5.  Assessment / Impression  Patient is a 62 y.o. domiciled, employed, single male with PMH of arthritis psychiatric history of Bipolar I disorder, PTSD, and polysubstance use admitted for worsening depression and suicidal ideation in the context of medication non-adherence, social isolation, and inadequate access to care. Patient is admitted on a voluntary basis.  At this time, the patient would continue to benefit from inpatient psychiatric hospitalization for safety, stabilization, diagnostic clarification, medication management, and discharge planning.     Suicide Risk Assessment   Suicide Risk Assessment performed? Yes: Suicide Thoughts / Behaviors: None  Current Plan: None  Access to firearm: No  Past suicide attempts: Yes  Diagnosis of MDD, BPAD, Schizophrenia, Cluster B PD, Anorexia, Substance Use: Yes, Bipolar Depression versus substance-induced mood disorder.     Plan / Recommendations:     Biological Plan:  Medications:   PRN Vistaril 100 mg po Q4H; Zofran 4 mg PRN  Continue Ativan 1.0 QHS for sleep and mania; discussed will not Rx on discharge  Continue Abilify 20 mg po daily      Medical Work-up: FNP in reference to irritation reported to tip of penis associated with balanitis.   Consults: None required at this time     Psychosocial Plan:  Individual therapy: Psycho-education and psychotherapy of the following modalities will continue to be provided during daily  visits:Supportive and Cognitive Behavioral Therapy  Group and milieu therapies: daily per unit's schedule.  Continue with Social Work intervention provided for discharge planning including assistance with placement, case management referral, and follow-up psychiatric care.     Plan for Family Involvement: Patient Refusing  Other Providers Contact Information and Dates Contacted: No Updates    A total of 35 minutes was spent on this visit reviewing previous notes, counseling the patient and/or family members regarding the patient's condition(s), ordering of tests, managing medications, and documenting the findings in this note.     Signed by: Denice Paradise, NP  06/07/2022  5:33 PM

## 2022-06-07 NOTE — Plan of Care (Addendum)
Sho was A+Ox4 with flat affect and pressured and loud speech, denied SI/HI/AVH/SIB urges. BVC is 0. Pt states he is committed to maintaining his safety, encouraged to come to staff if thoughts or urges to harm self or others increases, Lukas verbalizes understanding. Pt is calm and cooperative with Probation officer, able to answer questions and maintain good eye contact. Kiegan rated his depression as 5 and anxiety as 4 (with 10 being the highest possible value). Jacori was compliant with medication administration and mouth check completed. Semisi was visible in the milieu throughout the shift, and participated in groups, and interacted with peers and staff appropriately. Q15 min safety checks maintained. Will continue to monitor and ensure safety. Patient appeared resting comfortably in bed for 6 hours. Respiration even and unlabored.   PRNs? Why? Vistaril 100mg  for sleep, Ibuprofen 800mg  for knee pain      "I, Mikaele Stecher, hereby attest that I have been made aware of and reviewed the following for this patient:    - Biopsychosocial assessment;  - Diagnoses;  - Nursing needs and medical support, if applicable; and  - Treatment Plan Goals, Objectives and Strategies/Interventions.    I am involved in the care of this patient within my scope of practice and assigned tasks. I approve the treatment plan and this attestation is in lieu of a signature on the document."    Problem: At Risk for Suicide AS EVIDENCED BY...  Goal: Patient will remain safe during hospitalization  Outcome: Progressing  Goal: Suicide Alert Level Moderate  Outcome: Progressing  Goal: Identifies triggers and protective strategies  Outcome: Progressing     Problem: Thought Disorder  Goal: Demonstrates orientation to person place and time  Outcome: Progressing  Goal: Conducts goal directed, coherent conversation  Outcome: Progressing     Problem: Mood Disorder  Goal: Reports improved mood  Outcome: Progressing     Problem: Safety  Goal: Patient will be  free from injury during hospitalization  Outcome: Progressing     Problem: Pain interferes with ability to perform ADL  Goal: Pain at adequate level as identified by patient  Outcome: Progressing

## 2022-06-08 NOTE — Plan of Care (Signed)
Problem: At Risk for Suicide AS EVIDENCED BY...  Goal: Patient will remain safe during hospitalization  Description: Janari will remain safe during hospitalization    Interventions:  1. Complete suicide risk assessment at admission, PRN, and prior to discharge  2. Initiate Suicide Alert Level safety interventions as indicated  3. Reassess suicidal ideation every 12 hrs  06/08/2022 1445 by Aggie Hacker, RN  Outcome: Progressing  Patient will remain safe during hospitalization: Assess Suicide Risk each shift using Suicide Assessment Tool (SAT)  Goal: Suicide Alert Level Moderate  Description: Interventions:  1. Every 15 minute in person checks  2. Educate Arwin to report increase in suicidal ideation or concerns about safety  3. Reassess suicidal ideation every 12 hrs  4. Complete in-hospital safety plan  5. Order safety tray for meals as indicated   6. Room search for dangerous items per LIP order as indicated  7. Mouth checks following medication administration as indicated  8. Monitor use of bathroom as indicated  9. Assign room close to team station as indicated     Outcome: Progressing  06/08/2022 1443 by Aggie Hacker, RN  Flowsheets (Taken 06/08/2022 1443)  Suicide Alert Level Moderate:   Every 15 minute checks   Educate patient to ask for help   Reassess patient every shift using Suicide Assessment Tool (SAT)  Goal: Identifies triggers and protective strategies  Description: Taryll will Identify 3 triggers and 3 protective strategies by 06/04/22  Interventions:  1. Assist Santosh to identify events preceding or prompting suicidal ideation  2. Assist Delmo to identify distracting activities, thoughts, or calming techniques  3. Assist Almando to identify personal support network    4. Assist Canyon to identify professionals, or organizations to contact for help     Flowsheets (Taken 06/08/2022 1443)  Identifies triggers and protecive strategies:   Assist to identify triggers to aggressive/violent thoughts    Assist to identify safe, positive strategies to cope   Reinforce patient's efforts to manage anger and aggression  Goal: Verbalizes understanding of medication, benefits, and side effects  Description: Storm will verbalize understanding of medication, benefits, and side effects of each of his medications when received.   Interventions:  1. Ensure Informed consent for all psychotropic medications   2. Provide medication teaching including name, dosage, benefits, action, effect and side effects   3. Educate Hermilo  to report response to medications including side effects  Flowsheets (Taken 06/08/2022 1443)  Verbalizes understanding of medication, benefits, and side effects: Encourage to report response to medications including side effects  Goal: Identify and participate in supportive program therapies  Description: Kayshawn will identify and participate in 3 supportive program therapies each day by 06/04/22  Interventions:  1. Assist Ryelan to target therapy groups that support individual's treatment goals  2. Assist to [facility]  identify personal skill development or activity goal  3. Encourage [employer] and reinforce small successes in participation    Outcome: Progressing   Problem: Thought Disorder  Goal: Verbalizes reduction in hallucinations/delusions  Description:   Syon will verbalize reduction in hallucinations/delusions by 06/04/22  Interventions:  1. Monitor the presence of hallucinations/delusions every shift   2. Reality test with Matas if Dinero able to tolerate  Outcome: Progressing    Rico was A+Ox4 with flat blunt affect and has hyper verbal loud slurred speech. He denies SI/HI/AVH/SIB urges. BVC is 0. Pt states they he committed to maintaining his safety, encouraged to come to staff if thoughts or urges to harm self or others  increases, Jervon verbalizes understanding. Pt is hyper religious verbalizes thoughts that "God is there with him.he was cooperative with this Probation officer for  care,he was able to answer questions and maintain  eye contact. Pt describes his mood as better. He reported his sleep/energy/appetite is increased.  Mana rated his depression as 4 and anxiety as 2 decreased. Ward states his goal for today is to maintain safety ,he refused group participation related to his thoughts of demons in other minds that makes him agitated .Hulon  was compliant with medication administration and mouth check completed. Massimiliano was visible to the hallways throughout the shift, . Q15 min safety checks maintained. Will continue to monitor and ensure safety.  PRNs Advil and Tylenol Given for his arthritis knee pain,Follow remained effective.       "I, Aggie Hacker, hereby attest that I have been made aware of and reviewed the following for this patient:    - Biopsychosocial assessment;  - Diagnoses;  - Nursing needs and medical support, if applicable; and  - Treatment Plan Goals, Objectives and Strategies/Interventions.    I am involved in the care of this patient within my scope of practice and assigned tasks. I approve the treatment plan and this attestation is in lieu of a signature on the document."

## 2022-06-08 NOTE — Progress Notes (Signed)
Psychiatry Progress Note  (Level 1 = Problem Focused, Level 2 = Expanded Problem Focused, Level 3 = Detailed)    Patient Name: [name]            Current Date/Time:  1/14/202412:57 PM  MRN:  [id number]                            Attending Physician: Jeri Cos, MD  DOB: Apr 03, 1961                                Gender: male                              I. History   Informants: patient, chart review, clinical staff  A. Chief Complaint or Reason for Admission  "I have PTSD and Bipolar and I have been off of my medications for two weeks. I have a history of cutting myself and I have been having feeling hopeless and suicidal"   Admitted for SI and CAH.     B.History of Present Illness: Interval History     (Symptoms and qualifiers:1 for Level 1, 2 for Level 2 and 3+ for Level 3)  [name] is a 62 year old domiciled, employed, single man with past medical hx of arthritis and past psychiatric hx of PTSD, bipolar I disorder, and polysubstance use who presented with worsening depression, and SI in the context of medication nonadherence, recent alcohol and cocaine use, and social isolation.        INTERVAL HISTORY: June 07, 2022    Patient is found in his room at time of assessment. He remains tangential and hyperverbal moving from topic to topic in rapid succession.  He is upset at the visit time stating that he is "angry" that the cortisone cream requested yesterday for his athlete's foot was not given to him.  This Probation officer informed patient that it would be ordered following the visit to which he was amenable.  He then shows this Probation officer unprompted his foot and rash.  He states intention to attend groups and participate in his treatment and recovery.  He denies SI, HI, AVH, SIB at time of visit.    INTERVAL HISTORY: June 08, 2022    Patient is found in the milieu at the time of treatment visit with peers on the unit in either group or in their respective rooms.  He is isolative at a table, working  on a puzzle.  He states that his mood is "alright" however his PTSD "oscillates between extrovert to introvert and today is introvert."  He admits being agitated and says that he is grateful for single bedrooms versus having to share with anyone.  He reports that he will not be attending groups today as a therapeutic coping mechanism despite encouragement to attend as a form of distraction.  He denies SI, HI, AVH, and SIB.      C.Medical History  Review of Systems  (Level 1 is none, Level 2 is 1, and Level 3 is 2+)  Review of Systems   Skin:  Positive for rash. Negative for itching.   Psychiatric/Behavioral:  Positive for depression.            D.Additional Past Psychiatric, Substance Use, Medical, Family and Social History  Psychiatric: No new information available as compared to  previous encounter(s)  Substance Use: No new information available as compared to previous encounter(s)  Medical: No new information available as compared to previous encounter(s)  Family: No new information available as compared to previous encounter(s)  Social: No new information available as compared to previous encounter(s)    Medications:   Current Medications       Scheduled       Medication Ordered Dose/Rate, Route, Frequency Last Action    ARIPiprazole (ABILIFY) tablet 20 mg 20 mg, PO, Daily Given, 20 mg at 01/14 0839    folic acid (FOLVITE) tablet 1 mg 1 mg, PO, Daily Given, 1 mg at 01/05 0839    hydrocortisone (CORTAID) 1 % cream No Dose/Rate, TP, BID Given, No Dose/Rate at 01/14 0848    LORazepam (ATIVAN) tablet 1 mg 1 mg, PO, QHS Given, 1 mg at 01/13 2158    multivitamin tablet 1 tablet 1 tablet, PO, Daily Given, 1 tablet at 01/05 0839    nystatin (NYSTOP) powder No Dose/Rate, TP, BID Given, No Dose/Rate at 01/13 0857              PRN       Medication Ordered Dose/Rate, Route, Frequency Last Action    acetaminophen (TYLENOL) tablet 650 mg 650 mg, PO, Q6H PRN Given, 650 mg at 01/14 0841    alum & mag hydroxide-simethicone  (MAALOX PLUS) 200-200-20 mg/5 mL suspension 30 mL 30 mL, PO, Q6H PRN Ordered    benztropine (COGENTIN) tablet 1 mg (Or Linked Group #1) 1 mg, PO, Once PRN Ordered    benztropine mesylate (COGENTIN) injection 2 mg (Or Linked Group #1) 2 mg, IM, Once PRN Ordered    docusate sodium (COLACE) capsule 100 mg 100 mg, PO, BID PRN Ordered    hydrOXYzine (VISTARIL) capsule 100 mg 100 mg, PO, QHS PRN Given, 100 mg at 01/13 2159    hydrOXYzine (VISTARIL) capsule 50 mg 50 mg, PO, Q4H PRN Given, 50 mg at 01/14 0846    ibuprofen (ADVIL) tablet 800 mg 800 mg, PO, Q8H PRN Given, 800 mg at 01/14 1133    loperamide (IMODIUM) capsule 2 mg 2 mg, PO, Q3H PRN Ordered    OLANZapine (ZyPREXA) tablet 10 mg 10 mg, PO, BID PRN Given, 10 mg at 01/02 0840    OLANZapine (ZyPREXA) tablet 5 mg 5 mg, PO, QHS PRN Ordered    ondansetron (ZOFRAN) tablet 4 mg 4 mg, PO, Q8H PRN Given, 4 mg at 01/06 1150                    II. Examination   Vital signs reviewed:   Blood pressure 122/66, pulse 82, temperature 97.5 F (36.4 C), temperature source Oral, resp. rate 16, SpO2 96 %.     Mental Status Exam  (Level 1 is 1-5, Level 2 is 6-8, Level 3 is 9+)  General appearance: Appears chronological age, adequate hygiene and grooming; wearing personal clothing  Attitude/Behavior: Cooperative, Eye Contact is avoidant  Motor: Psychomotor agitation   Gait: not observed  Muscle strength and tone: Not tested  Speech:  Spontaneous: Yes  Rate and Rhythm: normal  Volume: Normal  Tone: Normal  Clarity: Yes  Mood: "alright"  Affect:  Range: Constricted  Stability: Stable  Appropriateness to thought content: Yes   Intensity: intense  congruent  Thought Process:  Coherent: Yes  Logical:  Yes at times  Associations: Goal-directed; circumstantial  Thought Content:   Delusions:  Not elicited    Depressive Cognitions:  None  Suicidal:  No suicidal thoughts  Homicidal:  No homicidal thoughts  Violent Thoughts:  No  Perceptions:  Dissociative Phenomena:  No  Hallucinations:  None  Insight: Adequate  Judgment: Fair   Cognition:   Level of Consciousness: Intact  Orientation: Intact to self, place and time     Psychiatric / Cognitive Instruments: None     Pertinent Physical Exam: Not relevant to current chief complaint/reason for admission    Imaging / EKG / Labs: Labs in the last 24 hours  Results       ** No results found for the last 24 hours. **          III. Assessment and Plan (Medical Decision Making)     1. I certify that this patient continues to require inpatient hospitalization due to acute risk to self    2. Psychiatric Diagnoses  Bipolar 1 Disorder, current episode depressed, severe, with psychotic features  Rule out substance-induced mood disorder   PTSD  Polysubstance use disorder (alcohol, cocaine, benzodiazepines (?))  Rule out malingering (increasingly likely)      3.All diagnostic procedures completed since admission were reviewed. Past medical records reviewed. Coordination of care was discussed with inpatient team and as available with the outpatient team.     4. On this admission patient educated about and provided input into their treatment plan.  Patient understands potential risks and benefits of proposed treatment plan.      5.  Assessment / Impression  Patient is a 62 y.o. domiciled, employed, single male with PMH of arthritis psychiatric history of Bipolar I disorder, PTSD, and polysubstance use admitted for worsening depression and suicidal ideation in the context of medication non-adherence, social isolation, and inadequate access to care. Patient is admitted on a voluntary basis.  At this time, the patient would continue to benefit from inpatient psychiatric hospitalization for safety, stabilization, diagnostic clarification, medication management, and discharge planning.     Suicide Risk Assessment   Suicide Risk Assessment performed? Yes: Suicide Thoughts / Behaviors: None  Current Plan: None  Access to firearm: No  Past suicide attempts: Yes  Diagnosis of  MDD, BPAD, Schizophrenia, Cluster B PD, Anorexia, Substance Use: Yes, Bipolar Depression versus substance-induced mood disorder.     Plan / Recommendations:     Biological Plan:  Medications:   PRN Vistaril 100 mg po Q4H; Zofran 4 mg PRN  Continue Ativan 1.0 QHS for sleep and mania; discussed will not Rx on discharge  Continue Abilify 20 mg po daily      Medical Work-up: None required at this time.  Consults: None required at this time     Psychosocial Plan:  Individual therapy: Psycho-education and psychotherapy of the following modalities will continue to be provided during daily visits:Supportive and Cognitive Behavioral Therapy  Group and milieu therapies: daily per unit's schedule.  Continue with Social Work intervention provided for discharge planning including assistance with placement, case management referral, and follow-up psychiatric care.     Plan for Family Involvement: Patient Refusing  Other Providers Contact Information and Dates Contacted: No Updates    A total of 35 minutes was spent on this visit reviewing previous notes, counseling the patient and/or family members regarding the patient's condition(s), ordering of tests, managing medications, and documenting the findings in this note.     Signed by: Denice Paradise, NP  06/08/2022  12:57 PM

## 2022-06-08 NOTE — Plan of Care (Signed)
Problem: At Risk for Suicide AS EVIDENCED BY...  Goal: Patient will remain safe during hospitalization  Description: Trevion will remain safe during hospitalization    Interventions:  1. Complete suicide risk assessment at admission, PRN, and prior to discharge  2. Initiate Suicide Alert Level safety interventions as indicated  3. Reassess suicidal ideation every 12 hrs  Outcome: Progressing     Problem: Mood Disorder  Goal: Reports improved mood  Description: Manford reports improved mood by 06/04/22    Interventions:  1. Encourage the Jarick to verbalize feelings of anxiety, anger, and fears   2. Frequent brief 1:1 conversations with the [place] to assess mood /coping response  Outcome: Progressing     Problem: Pain interferes with ability to perform ADL  Goal: Pain at adequate level as identified by patient  Description: Interventions:  1. Identify patient comfort function goal  2. Evaluate if patient comfort function goal is met  3. Assess pain on admission, during daily assessment and/or before any "as needed" intervention(s)  4. Reassess pain within 30-60 minutes of any procedure/intervention, per Pain Assessment, Intervention, Reassessment (AIR) Cycle  5. Evaluate patient's satisfaction with pain management progress  6. Offer non-pharmacological pain management interventions  7. Consult/collaborate with Pain Service  8. Consult/collaborate with Physical Therapy, Occupational Therapy, and/or Speech Therapy  9. Assess for risk of opioid induced respiratory depression and side effects, including snoring/sleep apnea. Alert healthcare team of risk factors identified.  10. Include patient/patient care companion in decisions related to pain management as needed  Outcome: Progressing   Taevion was A+Ox4 with blunted affect and preservative speech. He denies SI/HI/AVH/SIB urges. BVC is 0. Pt states they his is committed to maintaining  his safety, encouraged to come to staff if thoughts or urges to harm self or  others increases, Cyprian verbalizes understanding. Pt is calm and cooperative with Probation officer, able to answer questions and maintain  eye contact. Pt describes their mood as "better". No issue reported with  sleep/energy/appetite.  Javen rated their depression as 3 and anxiety as 7 and pain as 7 (with 10 being the highest possible value) and prn medication given and effective. Evette Doffing was compliant with medication administration and mouth check completed. Xayvier was seen in the milieu interacting with peers and staff appropriately. Q15 min safety checks maintained. Will continue to monitor and ensure safety.      PRNs: Ibuprofen for "arthritis" pain, vistaril for anxiety   Slept : 8h     "I, Letina Luckett, RN, hereby attest that I have been made aware of and reviewed the following for this patient:    - Biopsychosocial assessment;  - Diagnoses;  - Nursing needs and medical support, if applicable; and  - Treatment Plan Goals, Objectives and Strategies/Interventions.    I am involved in the care of this patient within my scope of practice and assigned tasks. I approve the treatment plan and this attestation is in lieu of a signature on the document."

## 2022-06-09 DIAGNOSIS — F309 Manic episode, unspecified: Secondary | ICD-10-CM

## 2022-06-09 NOTE — Progress Notes (Signed)
Psychiatry Progress Note  (Level 1 = Problem Focused, Level 2 = Expanded Problem Focused, Level 3 = Detailed)    Patient Name: [name]            Current Date/Time:  1/15/20242:18 PM  MRN:  [id number]                            Attending Physician: Jeri Cos, MD  DOB: [birth year]                                Gender: male                              I. History   Informants: patient, chart review, clinical staff  A. Chief Complaint or Reason for Admission  "I have PTSD and Bipolar and I have been off of my medications for two weeks. I have a history of cutting myself and I have been having feeling hopeless and suicidal"   Admitted for SI and CAH.     B.History of Present Illness: Interval History     (Symptoms and qualifiers:1 for Level 1, 2 for Level 2 and 3+ for Level 3)  [name] is a 62 year old domiciled, employed, single man with past medical hx of arthritis and past psychiatric hx of PTSD, bipolar I disorder, and polysubstance use who presented with worsening depression, and SI in the context of medication nonadherence, recent alcohol and cocaine use, and social isolation.        INTERVAL HISTORY: June 09, 2022  Patient seen and evaluated.  Interim charting reviewed.   Case discussed with treatment team.  No episodes of acute behavioral events or agitation on the unit.  Compliant with medications and tolerating well. No side effects reported.    Today, patient reports that he is improving and he thinks that he may be stable for discharge to a rehab facility by Wednesday of this week. Without prompting, he shares that he slept better, he is tolerating Abilify well, he is not feeling suicidal, and the voices in his head are getting slower and he is better able to "talk down the demons." He remains hyperverbal at times, in particular as he discusses his stance on racial disparities. He states that he would like to keep his medication regimen exactly the same until he discharges from the  hospital. He is aware that benzodiazepines will likely not be prescribed upon discharge.     C.Medical History  Review of Systems  (Level 1 is none, Level 2 is 1, and Level 3 is 2+)  A complete 14 point ROS was done and was negative except for:   Psychiatric: see HPI   Constitutional: See HPI     D.Additional Past Psychiatric, Substance Use, Medical, Family and Social History  Psychiatric: No new information available as compared to previous encounter(s)  Substance Use: No new information available as compared to previous encounter(s)  Medical: No new information available as compared to previous encounter(s)  Family: No new information available as compared to previous encounter(s)  Social: No new information available as compared to previous encounter(s)    Medications:   Current Medications       Scheduled       Medication Ordered Dose/Rate, Route, Frequency Last Action    ARIPiprazole (ABILIFY) tablet 20  mg 20 mg, PO, Daily Given, 20 mg at 01/15 0925    folic acid (FOLVITE) tablet 1 mg 1 mg, PO, Daily Given, 1 mg at 01/05 0839    hydrocortisone (CORTAID) 1 % cream No Dose/Rate, TP, BID Given, No Dose/Rate at 01/14 0848    LORazepam (ATIVAN) tablet 1 mg 1 mg, PO, QHS Given, 1 mg at 01/14 2148    multivitamin tablet 1 tablet 1 tablet, PO, Daily Given, 1 tablet at 01/05 0839    nystatin (NYSTOP) powder No Dose/Rate, TP, BID Given, No Dose/Rate at 01/13 0857              PRN       Medication Ordered Dose/Rate, Route, Frequency Last Action    acetaminophen (TYLENOL) tablet 650 mg 650 mg, PO, Q6H PRN Given, 650 mg at 01/15 1400    alum & mag hydroxide-simethicone (MAALOX PLUS) 200-200-20 mg/5 mL suspension 30 mL 30 mL, PO, Q6H PRN Ordered    benztropine (COGENTIN) tablet 1 mg (Or Linked Group #1) 1 mg, PO, Once PRN Ordered    benztropine mesylate (COGENTIN) injection 2 mg (Or Linked Group #1) 2 mg, IM, Once PRN Ordered    docusate sodium (COLACE) capsule 100 mg 100 mg, PO, BID PRN Ordered    hydrOXYzine (VISTARIL)  capsule 100 mg 100 mg, PO, QHS PRN Given, 100 mg at 01/14 2148    hydrOXYzine (VISTARIL) capsule 50 mg 50 mg, PO, Q4H PRN Given, 50 mg at 01/15 0926    ibuprofen (ADVIL) tablet 800 mg 800 mg, PO, Q8H PRN Given, 800 mg at 01/15 0926    loperamide (IMODIUM) capsule 2 mg 2 mg, PO, Q3H PRN Ordered    OLANZapine (ZyPREXA) tablet 10 mg 10 mg, PO, BID PRN Given, 10 mg at 01/02 0840    OLANZapine (ZyPREXA) tablet 5 mg 5 mg, PO, QHS PRN Ordered    ondansetron (ZOFRAN) tablet 4 mg 4 mg, PO, Q8H PRN Given, 4 mg at 01/06 1150                    II. Examination   Vital signs reviewed:   Blood pressure 128/71, pulse 89, temperature 98.5 F (36.9 C), temperature source Oral, resp. rate 17, SpO2 98 %.     Mental Status Exam  (Level 1 is 1-5, Level 2 is 6-8, Level 3 is 9+)  General appearance: Appears chronological age, adequate hygiene and grooming; wearing personal clothing  Attitude/Behavior: Cooperative, Eye Contact is good  Motor: None  Gait: WNL  Muscle strength and tone: Not tested  Speech:  Spontaneous: Yes; hyperverbal at times, redirectable  Rate and Rhythm: increased  Volume: Normal  Tone: Normal  Clarity: Yes at times  Mood: "good"  Affect:  Range: Normal  Stability: Labile  Appropriateness to thought content: Yes   Intensity: normal  congruent  Thought Process:  Coherent: Yes  Logical:  Yes at times  Associations: Goal-directed; tangential at times  Thought Content:   Delusions:  Not elicited    Depressive Cognitions:  None  Suicidal:  Denies SI today  Homicidal:  No homicidal thoughts  Violent Thoughts:  No  Perceptions:  Dissociative Phenomena:  No  Hallucinations: AH  Insight: Adequate - knows he should not be discharged without a good plan in place; recognized benefit of continued monitoring for stability and medication management, does not want to end up back on the street using drugs; acknowledges that he has historically self-medicated with alcohol and cocaine  Judgment: Fair - going to groups; taking  medications; knows his triggers  Cognition:   Level of Consciousness: Intact  Orientation: Intact to self, place and time     Psychiatric / Cognitive Instruments: None     Pertinent Physical Exam: Not relevant to current chief complaint/reason for admission    Imaging / EKG / Labs: Labs in the last 24 hours  Results       ** No results found for the last 24 hours. **          III. Assessment and Plan (Medical Decision Making)     1. I certify that this patient continues to require inpatient hospitalization due to acute risk to self    2. Psychiatric Diagnoses  Bipolar 1 Disorder, current episode depressed, severe, with psychotic features  Rule out substance-induced mood disorder   PTSD  Polysubstance use disorder (alcohol, cocaine, benzodiazepines (?))  Rule out malingering (increasingly likely)      3.All diagnostic procedures completed since admission were reviewed. Past medical records reviewed. Coordination of care was discussed with inpatient team and as available with the outpatient team.     4. On this admission patient educated about and provided input into their treatment plan.  Patient understands potential risks and benefits of proposed treatment plan.      5.  Assessment / Impression  Patient is a 62 y.o. domiciled, employed, single male with PMH of arthritis psychiatric history of Bipolar I disorder, PTSD, and polysubstance use admitted for worsening depression and suicidal ideation in the context of medication non-adherence, social isolation, and inadequate access to care. Patient is admitted on a voluntary basis.     Patient reports tolerating his medications well without side effects. He has selectively attended groups and he is hyperverbal as he shares his rationale behind doing so. He denies SI today and shares that his chronic AH have diminished in intensity. He currently feels safe in the hospital and he remains interested in rehab upon discharge when stable, potentially as early as  Wednesday--pending availability.       At this time, the patient would continue to benefit from inpatient psychiatric hospitalization for safety, stabilization, diagnostic clarification, medication management, and discharge planning.     Suicide Risk Assessment   Suicide Risk Assessment performed? Yes: Suicide Thoughts / Behaviors: None  Current Plan: None  Access to firearm: No  Past suicide attempts: Yes  Diagnosis of MDD, BPAD, Schizophrenia, Cluster B PD, Anorexia, Substance Use: Yes, Bipolar Depression versus substance-induced mood disorder.     Plan / Recommendations:     Biological Plan:  Medications:   PRN Vistaril 100 mg po Q4H; Zofran 4 mg PRN  Continue Ativan 1.0 QHS for sleep and mania; discussed will not Rx on discharge  Continue Abilify to 20 mg po daily   Scheduled Meds:  Current Facility-Administered Medications   Medication Dose Route Frequency    ARIPiprazole  20 mg Oral Daily    folic acid  1 mg Oral Daily    hydrocortisone   Topical BID    LORazepam  1 mg Oral QHS    multivitamin  1 tablet Oral Daily    nystatin   Topical BID     Continuous Infusions:  PRN Meds:.acetaminophen, alum & mag hydroxide-simethicone, benztropine **OR** benztropine mesylate, docusate sodium, hydrOXYzine, hydrOXYzine, ibuprofen, loperamide, OLANZapine, OLANZapine, ondansetron       Medical Work-up: Reached out to unit FNP in reference to irritation reported to tip of penis associated with balanitis.  Consults: None required at this time     Psychosocial Plan:  Individual therapy: Psycho-education and psychotherapy of the following modalities will continue to be provided during daily visits:Supportive and Cognitive Behavioral Therapy  Group and milieu therapies: daily per unit's schedule.  Continue with Social Work intervention provided for discharge planning including assistance with placement, case management referral, and follow-up psychiatric care.     Plan for Family Involvement: Patient Refusing  Other Providers  Contact Information and Dates Contacted: No Updates    I personally saw and examined the patient and spent 25 minutes on this visit reviewing previous notes, counseling the patient and/or family members regarding the patient's condition(s), ordering of tests, managing medications, and documenting the findings in the note.     Signed by: Eldridge Dace, NP  06/09/2022  2:18 PM

## 2022-06-09 NOTE — Plan of Care (Signed)
Problem: At Risk for Suicide AS EVIDENCED BY...  Goal: Patient will remain safe during hospitalization  Description: [name] will remain safe during hospitalization    Interventions:  1. Complete suicide risk assessment at admission, PRN, and prior to discharge  2. Initiate Suicide Alert Level safety interventions as indicated  3. Reassess suicidal ideation every 12 hrs  Outcome: Progressing     Problem: Thought Disorder  Goal: Conducts goal directed, coherent conversation  Description: [name] will Conducts goal directed, coherent conversation each day with treatment team by 05/30/22    Interventions:  1. Correct errors of perception in a matter of fact manner   2. Redirect conversations in a calm supportive manner   3. Assess [name]'s conversation every shift  Outcome: Progressing     Problem: Mood Disorder  Goal: Reports improved mood  Description: [name] reports improved mood by 06/04/22    Interventions:  1. Encourage the [name] to verbalize feelings of anxiety, anger, and fears   2. Frequent brief 1:1 conversations with the Mandy to assess mood /coping response  Outcome: Progressing     Problem: Safety  Goal: Patient will be free from injury during hospitalization  Description: [name] will be free from injury during hospitalization      Interventions:  1. Assess [name]'s risk for falls and implement fall prevention plan of care per policy      2. Provide and maintain safe environment   3. Use appropriate transfer methods   4. Ensure appropriate safety devices are available at the bedside   5. Include [name]/ family/ care giver in decisions related to safety   6. Hourly rounding   7. Assess for vincents risk for elopement and implement Elopement Risk Plan per policy   8. Provide alternative method of communication if needed (communication boards, writing)  Outcome: Progressing     Problem: Pain  Goal: Pain at adequate level as identified by patient  Description: Pain at adequate level as identified by  [name]    Interventions:  1. Identify [name] comfort function goal  2. Evaluate if [facility] comfort function goal is met  3. Assess pain on admission, during daily assessment and/or before any "as needed" intervention(s)  4. Reassess pain within 30-60 minutes of any procedure/intervention, per Pain Assessment, Intervention, Reassessment (AIR) Cycle  5. Evaluate [name]'s satisfaction with pain management progress  6. Offer non-pharmacological pain management interventions  7. Consult/collaborate with Pain Service  8. Consult/collaborate with Physical Therapy, Occupational Therapy, and/or Speech Therapy  9. Assess for risk of opioid induced respiratory depression and side effects, including snoring/sleep apnea. Alert healthcare team of risk factors identified.  [name] in decisions related to pain management as needed  Outcome: Progressing   [name] was A+Ox4 with blunted affect and c;lear religious speech. He denies SI/HI/SIB  and and endorses AVH urges. BVC is. Pt states he is committed to maintaining their safety, encouraged to come to staff if thoughts or urges to harm self or others increases, [name] verbalizes understanding. Pt is calm upset with writer because she completed mouth check after medication adminitration. He complied but than said " don't you ever ask me again to do such a thing" He accepted medication from charge nurse [name]. He was seen walking around  and socializing with peers. No inappropriate comment reported today. Q15 min safety checks maintained. Will continue to monitor and ensure safety.    PRNs? Why: Vistaril 100 for anxiety and Advil 800 for arthritis pain  Hours of sleep: 7      "  I, Travis Brigandi, RN, hereby attest that I have been made aware of and reviewed the following for this patient:    - Biopsychosocial assessment;  - Diagnoses;  - Nursing needs and medical support, if applicable; and  - Treatment Plan Goals, Objectives and Strategies/Interventions.    I am  involved in the care of this patient within my scope of practice and assigned tasks. I approve the treatment plan and this attestation is in lieu of a signature on the document."
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
## 2022-06-10 MED ORDER — IBUPROFEN 400 MG PO TABS
800.0000 mg | ORAL_TABLET | Freq: Three times a day (TID) | ORAL | Status: DC | PRN
Start: 2022-06-10 — End: 2022-06-11
  Administered 2022-06-10 – 2022-06-11 (×2): 800 mg via ORAL
  Filled 2022-06-10 (×2): qty 2

## 2022-06-10 MED ORDER — OLANZAPINE 10 MG PO TABS
10.0000 mg | ORAL_TABLET | Freq: Every evening | ORAL | Status: DC | PRN
Start: 2022-06-10 — End: 2022-06-11

## 2022-06-10 MED ORDER — HYDROXYZINE PAMOATE 25 MG PO CAPS
100.0000 mg | ORAL_CAPSULE | Freq: Every evening | ORAL | Status: DC
Start: 2022-06-10 — End: 2022-06-11
  Administered 2022-06-10: 100 mg via ORAL
  Filled 2022-06-10: qty 4

## 2022-06-10 NOTE — Discharge Summary (Signed)
PSYCHIATRY DISCHARGE SUMMARY  AND POST DISCHARGE CONTINUING CARE PLAN    Date/Time: 06/10/2022 4:42 PM  Patient Name: [name], [name]  MRN#: [id number]  Age: 62 y.o.   Date of Birth: 1960/08/18    Date of Admission:   05/27/2022  Date of Discharge:     06/11/2022  Admitting Physician:    Biagio Quint, DO  Discharge Physician:    Farris Has, MD     Event leading to hospitalization / Reason for Hospitalization   Chief Complaint or Reason for Admission:   "I have PTSD and Bipolar and I have been off of my medications for two weeks. I have a history of cutting myself and I have been having feeling hopeless and suicidal"     Admitted for SI and CAH.     History of Present Illness (Per H&P on 1/2)  Patient is a 62 y.o. domiciled, employed, single male with PMH of arthritis psychiatric history of Bipolar I disorder, PTSD, and polysubstance use admitted for worsening depression and suicidal ideation in the context of medication non-adherence, social isolation, and inadequate access to care. Patient is admitted on a voluntary basis.     Patient reports that he has a history of PTSD and Bipolar disorder and he has been off of his medications (reportedly olanzapine and hydroxyzine) for the last two weeks because he lost them and he does not currently have an outpatient psychiatric provider. He reports that he had been feeling manic--marked by racing thoughts, poor sleep, and high energy--and he had been self-medicating with "whatever I could get my hands on"--including alcohol and cocaine (UDS + for cocaine and benzodiazepines). Prior to this hospitalization, patient reports that he was "crashing/coming off the mania" and he had subsequently developed worsening depression with suicidal ideation in addition to experiencing ongoing command auditory hallucinations telling him "negative things" and instructing him to end his life. He reports that a concerned coworker had recommended that he present to the ED for a  psychiatric evaluation and he was ultimately admitted.     Today, patient continues to report feeling "really bad" with hopelessness and passive suicidal ideation. He reports that he has been hearing voices, which he refers to as "demons", for years but they "slow down" when he is medication adherent. He shares that his goal is to restart his prior medication regimen. He has trialed numerous psychoactive medications throughout his life time (reports that he was first diagnosed with bipolar disorder at 62 years old and he felt the most stable on Lithium, but he overdosed on Lithium and he was not interested in restarting it), and he has found a combination of olanzapine and hydroxyzine to be the most helpful thus far. He reports that his family members are all in [place] but he does not consent for the team to speak with his family because he does not want to worry them and he reports that they have been [place] but he is wary of doing so because of a traumatic event that took place in [place] when he was 62 years old.     Legal Status on admission was voluntary.    Discharge Diagnoses:       Principal Discharge Diagnosis:   Active Hospital Problems    Diagnosis POA    Principal Problem: Mania Yes    Bipolar disorder Yes      Resolved Hospital Problems   No resolved problems to  display.         Other  Psychiatric Diagnoses  Bipolar 1 Disorder, most recent episode manic, severe, with psychotic features  PTSD  Polysubstance use disorder (alcohol, cocaine)  Rule out malingering     Comorbid Conditions: Adverse medication side effect and Suicidal behavior -current or past    Labs/Scans/EKG     Results       None          No results found for any visits on 08/27/20.  Cardiology Results       Procedure Component Value Units Date/Time    ECG 12 lead [[id number]] Collected: 08/30/20 1123     Updated: 08/30/20 1811     Ventricular Rate 65 BPM      Atrial  Rate 65 BPM      P-R Interval 142 ms      QRS Duration 84 ms      Q-T Interval 394 ms      QTC Calculation (Bezet) 409 ms      P Axis -49 degrees      R Axis 74 degrees      T Axis 47 degrees     Narrative:      UNUSUAL P AXIS, POSSIBLE ECTOPIC ATRIAL RHYTHM  NONSPECIFIC T WAVE ABNORMALITY  ABNORMAL ECG  Confirmed by KOPIN, DAVID ([id number]) on 08/30/2020 6:11:18 PM            Consults:     Consult Orders (From admission, onward)      None          None.    Discharge Day Evaluation     Blood pressure 112/65, pulse 71, temperature 97.7 F (36.5 C), temperature source Oral, resp. rate 17, SpO2 99 %.    Mental Status Exam    General appearance: Appears chronological age, Good hygiene, Good grooming, and dressed in personal clothing  Attitude/Behavior: Calm, Cooperative, and Eye Contact is  Good  Motor: No abnormalities noted  Gait: No obvious abnormalities  Muscle strength and tone: Not tested  Speech:   Spontaneous: Yes  Rate and Rhythm: Increased  Volume: Normal  Tone: Normal  Clarity: Yes  Hyperverbal particularly when discussing racial/religious topics, but redirectable  Mood: "peaceful, ready to get out of here"   Affect:   Range: Full  Stability: Labile  Appropriateness to thought content: Yes  Intensity: Normal  Thought Process:   Coherent: Yes  Logical:  Yes at times  Associations: Goal-directed  Thought Content:   Delusions:  Not elicited  Depressive Cognitions:  None  Suicidal:  No suicidal thoughts  Homicidal:  No homicidal thoughts  Violent Thoughts:  No  Perceptions:   Hallucinations: No not observed RTIS  Insight: Fair  Judgment: Fair  Cognition:       Level of Consciousness: Intact  Orientation: Intact to self, place and time    Review of Systems  A complete 14 point ROS was done and was negative except for    Psychiatric: labile and disinhibited at times, but good insight and redirectable and generally appropriate  Constitutional: No complaints  Eyes: No changes  Ears/Nose/Mouth/Throat: No  changes  Cardiovascular: No complaints  Respiratory: No complaints  Gastrointestinal: No complaints  Genitourinary: No complaints  Muscular: No complaints  Integumentary: No complaints  Neurological: No complaints  Endocrine: No complaints  Allergy/Immumologic: No complaints  Hematologic/Lymphatic: No complaints    Hospital Course:   Brief Hospital Course Summary    The patient is a 62 y.o. domiciled, employed,  single male with PMH of arthritis and psychiatric history of Bipolar I disorder, PTSD, and polysubstance use admitted for worsening depression and suicidal ideation in the context of medication nonadherence, recent alcohol and cocaine use, and social isolation. Patient was admitted on a voluntary basis from 1/2- 06/11/2022.     On admission, patient endorsed symptoms of bipolar depression including low mood for > 2 wks, insomnia (difficulty falling asleep and staying asleep; estimates that he sleeps 3-4 hours on average), no interest, feelings of guilt / worthlessness / hopelessness / helplessness, poor energy, poor concentration, and recurrent suicidal ideation. He reported "crashing from mania" while endorsing a historical pattern of grandiosity ("feeling like God"), increased goal-directed activity, decreased judgement, distractibility, need for less sleep, and pressured speech--with ongoing racing thoughts and anxiety. Patient also endorsed hearing constant command auditory hallucinations of voices telling him "negative things" and instructing him to harm himself. UDS+ for cocaine and benzodiazepines, and this may have also contributed to his initial presentation. He was agreeable to medication management.    Olanzapine was started at 5 mg and increased to 15 mg to target bipolar depression with psychotic features (CAH instructing him to kill himself). Prozac was additionally started at 30 mg daily, but patient reportedly developed intolerable nausea from the combination of medications and requested to try  Lurasidone monotherapy instead. Lurasidone was started at 20 mg daily with dinner. However, patient became floridly manic with pressured speech, decreased need for sleep, and mood lability. Lurasidone was steadily increased to 60 mg daily and Ativan 1 mg qhs was added to lyse mania. However, after multiple days, patient continued to present with grandiosity, elevated mood, hyperreligiosity, and disinhibited speech. He agreed to a trial of Abilify in lieu of lurasidone given ongoing mania. Clonidine 0.1 mg qhs was also added given longstanding history of PTSD and intolerance to prazosin, but patient developed nausea and dizziness with clonidine and it was discontinued. Abilify was steadily increased from 10 mg to 20 mg daily. Although patient remained tangential and hyperverbal at times, he demonstrated improving insight and behavioral control and he was increasingly redirectable.     Eventually, patient reported improvement in symptoms and he consistently denied suicidality or thoughts of self-harm for several days. He also reported a reduction in the intensity of his AH. He was selectively participatory in groups and visible in the milieu. His sleep improved remarkably and he was uninterested in continuing Ativan post-discharge.     On the day of discharge, patient met with the treatment team and stated that he was feeling much better and wanted to transition to a residential rehab facility post discharge [facility]). He appeared to achieve maximum benefit from acute psychiatric hospitalization and does not meet criteria to be temporarily detained as patient no longer appears to be an imminent danger to himself or others. He was discharged directly to [facility] ([address], [place], [place] [address]) via [facility].     Patient was discharged on a final medication regimen of Abilify 20 mg daily for bipolar disorder, Vistaril 100 mg qhs for insomnia, and Ibuprofen 800 mg q8h PRN for pain. These  medications were not electronically sent as residential program in [place] to continue current medication list.      Safety:  Patient was admitted to the inpatient psychiatry unit for further diagnostic and safety evaluation, clinical stabilization with psychotropic treatment and non pharmacological interventions.  Patient was placed on q15 minute safety checks without a 1:1 sitter. There were no acute  safety or behavioral incidents. Patient had no episodes of self-harm or suicidal behavior. No back-up IM medications needed.          Medical:    There were no significant medical issues during this admission.      On the day of discharge:  Patient appeared with brighter affect and displayed better insight. Patient denied unmanageable symptoms of depression and anxiety. Patient did not appear overtly psychotic. He remained hyperreligious with increased speech at times, but he was increasingly insightful and redirectable and he was maintaining appropriate control. Patient denied SI/HI and was future oriented. Patient was maintaining compliance with medications. Patient was maintaining ADL's, eating well, and sleeping consistently. Patient was medically stable with no acute distress.     Patient's admission problem is stabilized, and patient no longer appears to be an imminent danger to himself or others and appeared to achieve maximal benefit of psychiatric hospitalization.     Patient has met treatment goals to allow safe transition to [facility] residential program.Treatment team determines patient's needs can be met at an alternate level of care (residential). Patient no longer requires intensity of services to achieve goals and does not meet criteria for continued inpatient psychiatric hospitalization.     Treatment team discussed the following topics with the patient in preparation for discharge:   - Reviewed, assessed, provided psychotherapy daily. Therapeutic coping mechanisms reviewed. Safety planning  reviewed.   - Patient is future focused (I.e. planning to transition to rehab facility, interested in maintaining sobriety, intends to continue medications).  - Patient denies SI, HI, AVH, or death wish. Pt states has no access to firearms. Pt is stable for discharge.   - Pt verbalized ability and plan to follow up.     Risks, benefits, and side effects of medications were reviewed with patient and patient verbalized understanding. Safety planning was reviewed. Plan for discharge was discussed and agreed upon with the treatment team.     Patient was discharged to [facility].    Medications at discharge:  Abilify 20 mg daily for bipolar disorder  Vistrail 100 mg qhs for insomnia and anxiety  Ibuprofen 800 mg Q8H PRN for pain    Scheduled Meds:  Current Facility-Administered Medications   Medication Dose Route Frequency    ARIPiprazole  20 mg Oral Daily    folic acid  1 mg Oral Daily    hydrocortisone   Topical BID    hydrOXYzine  100 mg Oral QHS    multivitamin  1 tablet Oral Daily    nystatin   Topical BID     Continuous Infusions:  PRN Meds:.acetaminophen, alum & mag hydroxide-simethicone, benztropine **OR** benztropine mesylate, docusate sodium, hydrOXYzine, ibuprofen, loperamide, OLANZapine, OLANZapine, ondansetron      Future plans for medication: To be managed by outpatient psychiatric provider.  Consider re-trial of olanzapine for mania/mood stabilization if indicated in the future.   Patient tolerated olanzapine well but reported intolerable GI with combination of Prozac.     Considerations for future care:    Patient has outpatient behavior health follow up appointments scheduled and verbalized the ability and plan to follow up.    Although patient demonstrated clear signs of mania/hypomania while admitted, consider malingering as a potential additional component of patient's presentation:   During this hospital course, there were several points during which patient appeared to embellish  symptoms with unclear secondary gain (possibly to obtain benzodiazepines) and/or was preoccupied with various needs.   Per chart review, patient has  been inconsistent with outpatient follow-up or medication-adherence.   Additionally, chart review indicates an extensive history of recent admissions and ED presentations for similar mental health concerns, with records from the [facility] indicating that patient's most recent hospitalization prior to this admission was from 05/15/2022-05/23/2022. This was inconsistent with patient's narrative surrounding the events preceding this hospitalization.     Suicidal and Homicidal Status on Discharge:   Patient denies suicidal and homicidal ideation, intent and plan.    "65" is the three-digit, nationwide phone number to connect directly to the [facility] and [place]. By calling or texting 50, you'll connect with mental health professionals with the 37 Suicide and Crisis Lifeline, formerly known as the [employer] Suicide Administrator. Veterans can press "1" after dialing 988 to connect directly to the [employer] which serves our [employer], service members, [employer] and [employer], and those who support them. For texts, Veterans should continue to text the [employer] short code: [phone].    Discharge Instructions:   Discharge Disposition:  Residential facility    Discharge Instructions given to: Patient and Receiving Institution    Questions that may arise between hospital discharge and your first follow-up appointment should be directed to [facility]: [phone]; 911 or the closest emergency department    Case discussed with: inpatient treatment team   Discharge Plan:   [place] PLAN:   Follow up appointment with:     Admit to [facility] on Wednesday, 1/17  [place], [place] [address]    [phone]       FOR EMERGENCY MENTAL HEALTH or Substance Abuse Appointment, go to the nearest emergency room, call 911           [facility] [place] on [place].  [address] [place] [address]  p: [phone]  f: [phone]  e: [email or web address]        Depression and Bipolar Support Alliance        [facility] [place] Support [facility]  [place] - a support group of the [place]  Email: [email or web address]  Contact:  Dede  [phone]     [place]  Email: [email or web address]  Contact:  Jocelyn Lamer  [phone]    Attestation:   Patient discharged on more than 1 antipsychotic medication? No  On discharge, was the patient on any psychotropic medication off label? No  I spent at least 35 minutes coordinating the discharge and reviewing the discharge plan. Patient was also seen by Dr. Carrie Mew.     Discharge Medications:   Medications were not e-prescribed as patient is to transition directly to [facility] program. Patient to continue Abilify 20 mg daily for bipolar disorder and Hydroxyzine 100 mg qhs for insomnia and anxiety. Patient has also benefited from Ibuprofen 800 mg q8h prn for pain. Olanzapine 10 mg qhs was available PRN for insomnia not relieved by hydroxyzine, but patient did not require this medication while admitted.     Tobacco Cessation Discharge Prescription for Cessation Medication  Patient was assessed on admission and found not to be a tobacco user       Discharge Medication List  Taking      ARIPiprazole 20 MG tablet  Dose: 20 mg  Commonly known as: ABILIFY  For: Manic Phase of Manic-Depression, MIXED BIPOLAR AFFECTIVE DISORDER  Take 1 tablet (20 mg total) by mouth daily     folic acid 1 MG tablet  Dose: 1 mg  Commonly known as: FOLVITE  Start taking on: June 12, 2022  Take 1 tablet (1 mg) by mouth  daily     hydrocortisone 1 % cream  Commonly known as: CORTAID  For: Inflammation  Apply topically 2 (two) times daily     * hydrOXYzine 100 MG capsule  Dose: 100 mg  What changed:   medication strength  when to take this  reasons to take this  Commonly known as: VISTARIL  For: Feeling Anxious, State of Being Sedated  Take 1 capsule (100 mg) by mouth nightly     * hydrOXYzine 50 MG capsule  Dose: 50 mg  What changed: You were already taking a medication with the same name, and this prescription was added. Make sure you understand how and when to take each.  Commonly known as: VISTARIL  For: Feeling Anxious  Take 1 capsule (50 mg) by mouth every 4 (four) hours as needed for Anxiety     ibuprofen 800 MG tablet  Dose: 800 mg  Commonly known as: ADVIL  Take 1 tablet (800 mg) by mouth every 8 (eight) hours as needed for Pain     multivitamin Tabs  Dose: 1 tablet  For: Vitamin Deficiency  Start taking on: June 12, 2022  Take 1 tablet by mouth daily     nystatin powder  Commonly known as: NYSTOP  Apply topically 2 (two) times daily     OLANZapine 10 MG tablet  Dose: 10 mg  Commonly known as: ZyPREXA  For: Hypomanic Episode of Bipolar Disorder, Manic Phase of Manic-Depression, MIXED BIPOLAR AFFECTIVE DISORDER, insomnia PRN  Take 1 tablet (10 mg) by mouth nightly as needed (insomnia)           * This list has 2 medication(s) that are the same as other medications prescribed for you. Read the directions carefully, and ask your doctor or other care provider to review them with you.                STOP taking these medications      acetaminophen 500 MG tablet  Commonly known as: TYLENOL     albuterol 1.25 MG/3ML nebulizer solution  Commonly known as: ACCUNEB     fluticasone 50 MCG/ACT nasal spray  Commonly known as: FLONASE                Signed by: Felecia Shelling, NP   06/11/2022  1:07 PM

## 2022-06-10 NOTE — Plan of Care (Signed)
[name] has been restless with a blunted and intermittently anxious affect, cooperative with staff, and intermittently visible in the unit milieu this evening, though he kept mostly to himself with minimal interactions with his peers. He reports feeling "better and better day by day" and endorses an adequate energy level.    At this time, [name] is Aox4, maintains adequate eye contact, speaks with a clear voice, and is able to appropriately answer all questions. [name] continues to endorse religious-themed AH, stating that he hears the devil telling him to hurt/kill himself and [name] telling him to love himself, stating that he knows who's voice to listen to/which voice is looking out for him. He denies current SI/HI/VH and SIB urges and states that his depression and anxiety levels are not bothering him at this time. [name] states that he feels safe on the unit, has been able to adequately make his needs known, and has verbalized agreement to come to staff with any safety issues or concerns. BVC: 0.    [name] reports difficulties falling asleep due to the unit being "too cold", but voices finding his PM Ativan dose helpful for sleep quality overall. He denies having any concerns with his appetite. [name] initially endorses 10/10 severity bilateral joint pain in his knees and shoulders related to arthritis, which was brought down to 5/10 with rest and PRN medication prior to his falling asleep. He is medication adherent with no complaints of side effects, and mouth checks were completed per unit policy. PRN Vistaril 100 mg (for sleep) and Ibuprofen 800 mg (for arthritic pain control) were requested and given with positive effect alongside his scheduled nighttime medications.    [name] currently denies having other concerns related to his treatment or care.    [name] experienced some restlessness as he first attempted to fall asleep, but has been in bed with eyes closed since 2300 in NAD. Q15 min safety  checks were maintained per order and unit protocol. Will continue to monitor for safety and offer support.      "I, Travis Peters RN, hereby attest that I have been made aware of and reviewed the following for this patient:    - Biopsychosocial assessment;  - Diagnoses;  - Nursing needs and medical support, if applicable; and  - Treatment Plan Goals, Objectives and Strategies/Interventions.    I am involved in the care of this patient within my scope of practice and assigned tasks. I approve the treatment plan and this attestation is in lieu of a signature on the document."     Problem: At Risk for Suicide AS EVIDENCED BY...  Goal: Will identify long and short term goals based on individual needs and strengths  Description: [name] will identify 2 long and 2 short term goals based on individual needs and strengths by 06/04/22    Interventions:  1. Assist [name] to identify long term recovery goal  2. Assist [name] to identify short term goal(s) for treatment   3. Assist  [name] to define criteria for discharge  4. Engage [name] in developing initial treatment plan and signing acknowledgement     Outcome: Progressing  Goal: Patient will remain safe during hospitalization  Description: [name] will remain safe during hospitalization    Interventions:  1. Complete suicide risk assessment at admission, PRN, and prior to discharge  2. Initiate Suicide Alert Level safety interventions as indicated  3. Reassess suicidal ideation every 12 hrs  Outcome: Progressing  Goal: Suicide Alert Level Moderate  Description: Interventions:  1. Every  15 minute in person checks  2. Educate [name] to report increase in suicidal ideation or concerns about safety  3. Reassess suicidal ideation every 12 hrs  4. Complete in-hospital safety plan  5. Order safety tray for meals as indicated   6. Room search for dangerous items per LIP order as indicated  7. Mouth checks following medication administration as indicated  8. Monitor use of  bathroom as indicated  9. Assign room close to team station as indicated       Outcome: Progressing  Goal: Identifies triggers and protective strategies  Description: [name] will Identify 3 triggers and 3 protective strategies by 06/04/22    Interventions:  1. Assist [name] to identify events preceding or prompting suicidal ideation  2. Assist [name] to identify distracting activities, thoughts, or calming techniques  3. Assist [name] to identify personal support network    4. Assist [name] to identify professionals, or organizations to contact for help     Outcome: Progressing  Goal: Verbalizes understanding of medication, benefits, and side effects  Description: [name] will verbalize understanding of medication, benefits, and side effects of each of his medications when received.     Interventions:  1. Ensure Informed consent for all psychotropic medications   2. Provide medication teaching including name, dosage, benefits, action, effect and side effects   3. Educate [name]  to report response to medications including side effects  Outcome: Progressing  Goal: Identify and participate in supportive program therapies  Description: [name] will identify and participate in 3 supportive program therapies each day by 06/04/22    Interventions:  1. Assist [name] to target therapy groups that support individual's treatment goals  2. Assist to [facility]  identify personal skill development or activity goal  3. Encourage [employer] and reinforce small successes in participation    Outcome: Progressing     Problem: Thought Disorder  Goal: Demonstrates orientation to person place and time  Description: Demonstrates orientation to person place and time each day during assessment and interaction    Interventions:  1. Monitor [name]'s orientation status every shift   2. Re-orient the  [name] to environment as needed  Outcome: Progressing  Goal: Conducts goal directed, coherent conversation  Description: [name]  will Conducts goal directed, coherent conversation each day with treatment team by 05/30/22    Interventions:  1. Correct errors of perception in a matter of fact manner   2. Redirect conversations in a calm supportive manner   3. Assess [name]'s conversation every shift  Outcome: Progressing  Goal: Verbalizes reduction in hallucinations/delusions  Description:   [name] will verbalize reduction in hallucinations/delusions by 06/04/22    Interventions:  1. Monitor the presence of hallucinations/delusions every shift   2. Reality test with [name] if [name] able to tolerate  Outcome: Progressing     Problem: Mood Disorder  Goal: Reports improved mood  Description: [name] reports improved mood by 06/04/22    Interventions:  1. Encourage the [name] to verbalize feelings of anxiety, anger, and fears   2. Frequent brief 1:1 conversations with the Auther to assess mood /coping response  Outcome: Progressing     Problem: Safety  Goal: Patient will be free from injury during hospitalization  Description: Aggie will be free from injury during hospitalization      Interventions:  1. Assess Mirko's risk for falls and implement fall prevention plan of care per policy      2. Provide and maintain safe environment   3. Use appropriate transfer methods  4. Ensure appropriate safety devices are available at the bedside   5. Include Holston/ family/ care giver in decisions related to safety   6. Hourly rounding   7. Assess for vincents risk for elopement and implement Elopement Risk Plan per policy   8. Provide alternative method of communication if needed (communication boards, writing)  Outcome: Progressing  Goal: Patient will be free from infection during hospitalization  Description: Najeeb  will be free from infection during hospitalization    Interventions:  1. Assess and monitor for signs and symptoms of infection  2. Monitor lab/diagnostic results  3. Monitor all insertion sites (i.e. indwelling lines, tubes, urinary  catheters, and drains)  4. Encourage Reuel and family to use good hand hygiene technique  Outcome: Progressing     Problem: Pain  Goal: Pain at adequate level as identified by patient  Description: Pain at adequate level as identified by [facility]    Interventions:  1. Identify Abijah comfort function goal  2. Evaluate if [facility] comfort function goal is met  3. Assess pain on admission, during daily assessment and/or before any "as needed" intervention(s)  4. Reassess pain within 30-60 minutes of any procedure/intervention, per Pain Assessment, Intervention, Reassessment (AIR) Cycle  5. Evaluate Dontavius's satisfaction with pain management progress  6. Offer non-pharmacological pain management interventions  7. Consult/collaborate with Pain Service  8. Consult/collaborate with Physical Therapy, Occupational Therapy, and/or Speech Therapy  9. Assess for risk of opioid induced respiratory depression and side effects, including snoring/sleep apnea. Alert healthcare team of risk factors identified.  [place] in decisions related to pain management as needed  Outcome: Progressing     Problem: Pain interferes with ability to perform ADL  Goal: Pain at adequate level as identified by patient  Description: Interventions:  1. Identify patient comfort function goal  2. Evaluate if patient comfort function goal is met  3. Assess pain on admission, during daily assessment and/or before any "as needed" intervention(s)  4. Reassess pain within 30-60 minutes of any procedure/intervention, per Pain Assessment, Intervention, Reassessment (AIR) Cycle  5. Evaluate patient's satisfaction with pain management progress  6. Offer non-pharmacological pain management interventions  7. Consult/collaborate with Pain Service  8. Consult/collaborate with Physical Therapy, Occupational Therapy, and/or Speech Therapy  9. Assess for risk of opioid induced respiratory depression and side effects, including snoring/sleep apnea. Alert  healthcare team of risk factors identified.  10. Include patient/patient care companion in decisions related to pain management as needed  Outcome: Progressing     Problem: Side Effects from Pain Analgesia  Goal: Patient will experience minimal side effects of analgesic therapy  Description: Interventions:  1. Monitor/assess patient's respiratory status (RR depth, effort, breath sounds)  2. Assess for changes in cognitive function   3. Prevent/manage side effects per LIP orders (i.e. nausea, vomiting, pruritus, constipation, urinary retention, etc.)  4. Evaluate for opioid-induced sedation with appropriate assessment tool (i.e. POSS)  Outcome: Progressing

## 2022-06-10 NOTE — Plan of Care (Signed)
Met Antjuan in the hallway. He was A+Ox4 but irritable, verbally aggressive, responding to internal stimuli, talking to himself and to other with loud voice about radial difference between back and white. This Probation officer acknowledge patient's frustration, invited him to the room but patient refused. Writer also encourage patient not to defer his anger on others. Patient states that he likes when this type of conversation make other uncomfortable. "They need to be uncomfortable because they kept [place] in 400 years of slavery without compensation". 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 Patient added " you even have an accent, you don't talk like [place]". Patient was redirected about mutuel respect. Eugean denies SI/HI at this time,  Refused to answer question about AVH, but talking to himself a lot. Pt was encouraged to come to staff if thoughts or urges to harm self or others increases, Kazi refused to verbalizes understanding. Irritable and angry mood.  Tysin refused to rated his depression and anxiety. Jacion didn't states his goal for today. [place] is selectively compliant with medication administration, refused all vitamins. Delton was seen in the milieu throughout the shift, walking around the unit. Q15 min safety checks maintained. Will continue to monitor and ensure safety.  PRNs? Refused PRN anxiety medication.      "I, Luvenia Starch, hereby attest that I have been made aware of and reviewed the following for this patient:    - Biopsychosocial assessment;  - Diagnoses;  - Nursing needs and medical support, if applicable; and  - Treatment Plan Goals, Objectives and Strategies/Interventions.    I am involved in the care of this patient within my scope of practice and assigned tasks. I approve the treatment plan and this attestation is in lieu of a signature on the document."    Problem: At Risk for Suicide AS EVIDENCED BY...  Goal: Will identify long and short term goals  based on individual needs and strengths  Outcome: Progressing  Goal: Patient will remain safe during hospitalization  Outcome: Progressing  Goal: Suicide Alert Level Moderate  Outcome: Progressing  Goal: Identifies triggers and protective strategies  Outcome: Progressing  Goal: Verbalizes understanding of medication, benefits, and side effects  Outcome: Progressing  Goal: Identify and participate in supportive program therapies  Outcome: Progressing  Goal: Completes discharge safety and recovery plan  Outcome: Progressing     Problem: Thought Disorder  Goal: Demonstrates orientation to person place and time  Outcome: Progressing  Goal: Conducts goal directed, coherent conversation  Outcome: Progressing  Goal: Verbalizes reduction in hallucinations/delusions  Outcome: Progressing     Problem: Mood Disorder  Goal: Reports improved mood  Outcome: Progressing

## 2022-06-10 NOTE — Progress Notes (Signed)
[name]  62 y.o.  P631/P631.01  male  Active Hospital Problems    Diagnosis    Mania     Legal Status: Voluntary  Vitals:    06/09/22 1954   BP: 111/57   Pulse: 61   Resp:    Temp: 98.6 F (37 C)   SpO2: 99%     Blood Sugar Level: N/A  CIWA Score: N/A  COWS Score:N/A  Pain Score: 10/10 (down to 5/10 with Ibuprofen)  Pain Location: Bilateral shoulder/knee joint pain  Falls Risk Score: - LOW  Falls Risk Reason: None  Affect: blunted  Mood: labile, anxious, restless  Hallucinations: Religious AH (Hearing voice of devil and Jehovah)  Suicidal: No suicidal thoughts  Suicide Alert Level: Routine monitoring  Homicidal: No homicidal thoughts  Depression Level: Declined to rate - no concerns  Anxiety Level: Declined to rate - no concerns  Sleep Quality: Difficulty falling asleep  Sleep Hours: 7+ hrs  Energy: Energy level is good.  The patient is able to perform most usual functions.  Appetite: normal  NPO Status: N/A  ADLs: Independent with all self care tasks.  Hygiene: disheveled  Date of Last Bowel Movement: yesterday  Number of Groups Attended: 3  Medication/Medication Compliance: accepted  Cheeking Medication: No    LAR/Meds Over Objection: No    Medication Side Effects: none  DVT Prophylaxis: None needed  Compliant with Blood draws/Labs: compliant  Compliant with Vitals: compliant  Elopement risk: No    Sexual aggression: No    PRNs: Vistaril 100 mg and Advil 800 mg @ ~2130    Acute Events Overnight: NONE

## 2022-06-10 NOTE — Progress Notes (Signed)
Psychiatry Progress Note  (Level 1 = Problem Focused, Level 2 = Expanded Problem Focused, Level 3 = Detailed)    Patient Name: [name]            Current Date/Time:  1/16/202410:22 AM  MRN:  [id number]                            Attending Physician: Farris Has, MD  DOB: 04-01-1961                                Gender: male                              I. History   Informants: patient, chart review, clinical staff  A. Chief Complaint or Reason for Admission  "I have PTSD and Bipolar and I have been off of my medications for two weeks. I have a history of cutting myself and I have been having feeling hopeless and suicidal"   Admitted for SI and CAH.     B.History of Present Illness: Interval History     (Symptoms and qualifiers:1 for Level 1, 2 for Level 2 and 3+ for Level 3)  [name] is a 62 year old domiciled, employed, single man with past medical hx of arthritis and past psychiatric hx of PTSD, bipolar I disorder, and polysubstance use who presented with worsening depression, and SI in the context of medication nonadherence, recent alcohol and cocaine use, and social isolation.        INTERVAL HISTORY: June 10, 2022  Patient seen and evaluated.  Interim charting reviewed.   Case discussed with treatment team.  No episodes of acute behavioral events or agitation on the unit.  Compliant with medications and tolerating well. No side effects reported.    Today, patient reports continued improvement and he shares that he is interested in transitioning to a rehab facility as early as tomorrow. He reports that he is tolerating his medications well and he expresses that he is satisfied with his progress thus far. His sleep and appetite have improved. He denies any suicidality at this time and he also denies HI. He reports that he constantly hears the voices of "Jehovah" and "the devil"; with Jehovah telling him positive things and the devil commanding him to consider harming himself. He reports  that he is currently hearing the voice of Jehovah and he feels positive. He wants to ensure that he is discharged with an adequate supply of his medications--particularly vistaril, ibuprofen, and abilify. He reports that he is uninterested in taking ativan post-discharge.     C.Medical History  Review of Systems  (Level 1 is none, Level 2 is 1, and Level 3 is 2+)  A complete 14 point ROS was done and was negative except for:   Psychiatric: see HPI   Constitutional: See HPI     D.Additional Past Psychiatric, Substance Use, Medical, Family and Social History  Psychiatric: No new information available as compared to previous encounter(s)  Substance Use: No new information available as compared to previous encounter(s)  Medical: No new information available as compared to previous encounter(s)  Family: No new information available as compared to previous encounter(s)  Social: No new information available as compared to previous encounter(s)    Medications:   Current Medications  Scheduled       Medication Ordered Dose/Rate, Route, Frequency Last Action    ARIPiprazole (ABILIFY) tablet 20 mg 20 mg, PO, Daily Given, 20 mg at 92/11 9417    folic acid (FOLVITE) tablet 1 mg 1 mg, PO, Daily Given, 1 mg at 01/05 0839    hydrocortisone (CORTAID) 1 % cream No Dose/Rate, TP, BID Given, No Dose/Rate at 01/16 0908    LORazepam (ATIVAN) tablet 1 mg 1 mg, PO, QHS Given, 1 mg at 01/15 2120    multivitamin tablet 1 tablet 1 tablet, PO, Daily Given, 1 tablet at 01/05 0839    nystatin (NYSTOP) powder No Dose/Rate, TP, BID Given, No Dose/Rate at 01/16 0912              PRN       Medication Ordered Dose/Rate, Route, Frequency Last Action    acetaminophen (TYLENOL) tablet 650 mg 650 mg, PO, Q6H PRN Given, 650 mg at 01/15 1400    alum & mag hydroxide-simethicone (MAALOX PLUS) 200-200-20 mg/5 mL suspension 30 mL 30 mL, PO, Q6H PRN Ordered    benztropine (COGENTIN) tablet 1 mg (Or Linked Group #1) 1 mg, PO, Once PRN Ordered    benztropine  mesylate (COGENTIN) injection 2 mg (Or Linked Group #1) 2 mg, IM, Once PRN Ordered    docusate sodium (COLACE) capsule 100 mg 100 mg, PO, BID PRN Ordered    hydrOXYzine (VISTARIL) capsule 100 mg 100 mg, PO, QHS PRN Given, 100 mg at 01/15 2120    hydrOXYzine (VISTARIL) capsule 50 mg 50 mg, PO, Q4H PRN Given, 50 mg at 01/16 0910    ibuprofen (ADVIL) tablet 800 mg 800 mg, PO, Q8H PRN Given, 800 mg at 01/16 0910    loperamide (IMODIUM) capsule 2 mg 2 mg, PO, Q3H PRN Ordered    OLANZapine (ZyPREXA) tablet 10 mg 10 mg, PO, BID PRN Given, 10 mg at 01/02 0840    OLANZapine (ZyPREXA) tablet 5 mg 5 mg, PO, QHS PRN Ordered    ondansetron (ZOFRAN) tablet 4 mg 4 mg, PO, Q8H PRN Given, 4 mg at 01/06 1150                    II. Examination   Vital signs reviewed:   Blood pressure 132/83, pulse 80, temperature 97.5 F (36.4 C), temperature source Oral, resp. rate 17, SpO2 99 %.     Mental Status Exam  (Level 1 is 1-5, Level 2 is 6-8, Level 3 is 9+)  General appearance: Appears chronological age, adequate hygiene and grooming; wearing personal clothing  Attitude/Behavior: Cooperative, Eye Contact is good  Motor: None  Gait: WNL  Muscle strength and tone: Not tested  Speech:  Spontaneous: Yes  Rate and Rhythm: normal; not hyperverbal as before  Volume: Normal  Tone: Normal  Clarity: Yes at times  Mood: "good; ready to move on to the next step. I've accomplished what I need to here"  Affect:  Range: Normal  Stability: Labile; improving  Appropriateness to thought content: Yes   Intensity: normal  congruent  Thought Process:  Coherent: Yes  Logical:  Yes at times  Associations: Goal-directed; tangential at times  Thought Content:   Delusions:  Not elicited    Depressive Cognitions:  None  Suicidal:  Denies SI today  Homicidal:  No homicidal thoughts  Violent Thoughts:  No  Perceptions:  Dissociative Phenomena:  No  Hallucinations: AH  Insight: Adequate - knows he should not be discharged without a  good plan in place; recognized  benefit of continued monitoring for stability and medication management, does not want to end up back on the street using drugs; acknowledges that he has historically self-medicated with alcohol and cocaine  Judgment: Fair - taking medications; knows his triggers  Cognition:   Level of Consciousness: Intact  Orientation: Intact to self, place and time     Psychiatric / Cognitive Instruments: None     Pertinent Physical Exam: Not relevant to current chief complaint/reason for admission    Imaging / EKG / Labs: Labs in the last 24 hours  Results       ** No results found for the last 24 hours. **          III. Assessment and Plan (Medical Decision Making)     1. I certify that this patient continues to require inpatient hospitalization due to acute risk to self    2. Psychiatric Diagnoses  Bipolar 1 Disorder, current episode depressed, severe, with psychotic features  Rule out substance-induced mood disorder   PTSD  Polysubstance use disorder (alcohol, cocaine, benzodiazepines (?))  Rule out malingering (increasingly likely)      3.All diagnostic procedures completed since admission were reviewed. Past medical records reviewed. Coordination of care was discussed with inpatient team and as available with the outpatient team.     4. On this admission patient educated about and provided input into their treatment plan.  Patient understands potential risks and benefits of proposed treatment plan.      5.  Assessment / Impression  Patient is a 62 y.o. domiciled, employed, single male with PMH of arthritis psychiatric history of Bipolar I disorder, PTSD, and polysubstance use admitted for worsening depression and suicidal ideation in the context of medication non-adherence, social isolation, and inadequate access to care. Patient is admitted on a voluntary basis.     Patient reports that he is approaching his psychiatric baseline. He denies any SI/SIB/HI today. He reports that he constantly experiences AH with religious  themes (Jehovah and the devil) but he is able to distinguish the two and he has remained free of acute safety concerns on the unit. He currently feels safe in the hospital and he remains interested in rehab upon discharge when stable, potentially as early as tomorrow, pending availability. However, nursing notes later in the day indicate that patient has occasionally made racially charged and inappropriate comments in the milieu which may affect disposition.    Patient has consistently requested Vistaril 100 mg prn for sleep--will schedule Vistaril 100 mg qhs and discontinue Ativan and monitor sleep tonight--Zyprexa is available as backup for insomnia if needed.     At this time, the patient would continue to benefit from inpatient psychiatric hospitalization for safety, stabilization, diagnostic clarification, medication management, and discharge planning.     Suicide Risk Assessment   Suicide Risk Assessment performed? Yes: Suicide Thoughts / Behaviors: None  Current Plan: None  Access to firearm: No  Past suicide attempts: Yes  Diagnosis of MDD, BPAD, Schizophrenia, Cluster B PD, Anorexia, Substance Use: Yes, Bipolar Depression versus substance-induced mood disorder.     Plan / Recommendations:     Biological Plan:  Medications:   Vistaril 100 mg po Q4H; Zofran 4 mg PRN  Discontinue Ativan 1 mg qhs  If patient unable to sleep with vistaril, zyprexa 10 mg is available prn  Continue Abilify 20 mg po daily   Scheduled Meds:  Current Facility-Administered Medications   Medication Dose Route Frequency  ARIPiprazole  20 mg Oral Daily    folic acid  1 mg Oral Daily    hydrocortisone   Topical BID    hydrOXYzine  100 mg Oral QHS    multivitamin  1 tablet Oral Daily    nystatin   Topical BID     Continuous Infusions:  PRN Meds:.acetaminophen, alum & mag hydroxide-simethicone, benztropine **OR** benztropine mesylate, docusate sodium, hydrOXYzine, hydrOXYzine, ibuprofen, loperamide, OLANZapine, OLANZapine, ondansetron        Medical Work-up: None required at this time  Consults: None required at this time     Psychosocial Plan:  Individual therapy: Psycho-education and psychotherapy of the following modalities will continue to be provided during daily visits:Supportive and Cognitive Behavioral Therapy  Group and milieu therapies: daily per unit's schedule.  Continue with Social Work intervention provided for discharge planning including assistance with placement, case management referral, and follow-up psychiatric care.     Plan for Family Involvement: Patient Refusing  Other Providers Contact Information and Dates Contacted: No Updates    I personally saw and examined the patient and spent 35 minutes on this visit reviewing previous notes, counseling the patient and/or family members regarding the patient's condition(s), ordering of tests, managing medications, and documenting the findings in the note.   Patient was also seen by Dr. Carrie Mew. The assessment and plan were discussed and formulated together and has been reviewed and updated as needed.    Signed by: Felecia Shelling, NP  06/10/2022  10:22 AM

## 2022-06-10 NOTE — UM Notes (Signed)
[facility]  [place], [place] [address]  NPI: [id number]  Tax ID: [id number]     Attending:  Dr. Arlyss Repress, MD / NPI: [id number]        Inpatient CSR for 05/27/2022 [facility] admission     Legal Status:  Voluntary     1/15 Per chart note of NP Felecia Shelling:  Today, patient reports that he is improving and he thinks that he may be stable for discharge to a rehab facility by Wednesday of this week. Without prompting, he shares that he slept better, he is tolerating Abilify well, he is not feeling suicidal, and the voices in his head are getting slower and he is better able to "talk down the demons." He remains hyperverbal at times, in particular as he discusses his stance on racial disparities. He states that he would like to keep his medication regimen exactly the same until he discharges from the hospital. He is aware that benzodiazepines will likely not be prescribed upon discharge.      1/15 Per Nursing note:  [name] was A+Ox4 with blunted affect and c;lear religious speech. He denies SI/HI/SIB  and and endorses AVH urges.      1/15 Per Nursing note:  A & O x 4 with a flat, blunt demeanor.  He denies any SI and is committed to safety on the unit and was urged to seek help from staff if any thoughts to harm himself or others are present or increasing in intensity. He was hyper verbal and talked at length about his experience with PTSD and finding his faith and sharing it with others. He carries a Bible and Koran with him.     1/15 VS:  T 98.5, HR 89, RR 17, bp 128/71, Pox 98%     1/15 Psychiatric Diagnoses per NP Felecia Shelling:  F31.64 Bipolar disorder, current episode mixed, severe, with psychotic features    Rule out substance-induced mood disorder   PTSD  Polysubstance use disorder (alcohol, cocaine, benzodiazepines (?)  Rule out malingering (increasingly likely)      1/15 Mental Status Exam per NP Felecia Shelling:  General appearance: Appears chronological age, adequate hygiene and grooming;  wearing personal clothing  Attitude/Behavior: Cooperative, Eye Contact is good  Motor: None  Gait: WNL  Muscle strength and tone: Not tested  Speech:  Spontaneous: Yes; hyperverbal at times, redirectable  Rate and Rhythm: increased  Volume: Normal  Tone: Normal  Clarity: Yes at times  Mood: "good"  Affect:  Range: Normal  Stability: Labile  Appropriateness to thought content: Yes   Intensity: normal  congruent  Thought Process:  Coherent: Yes  Logical:  Yes at times  Associations: Goal-directed; tangential at times  Thought Content:   Delusions:  Not elicited    Depressive Cognitions:  None  Suicidal:  Denies SI today  Homicidal:  No homicidal thoughts  Violent Thoughts:  No  Perceptions:  Dissociative Phenomena:  No  Hallucinations: AH  Insight: Adequate - knows he should not be discharged without a good plan in place; recognized benefit of continued monitoring for stability and medication management, does not want to end up back on the street using drugs; acknowledges that he has historically self-medicated with alcohol and cocaine  Judgment: Fair - going to groups; taking medications; knows his triggers  Cognition:  Level of Consciousness: Intact  Orientation: Intact to self, place and time    1/15 Presenting Mental Status per Nursing:  Orientation Level: return to  [facility]  Thought Content: [facility]   Thought Process: [facility]   Hallucinations: auditory   Attitude: guarded   Mood: depressed   Affect: flat   Speech: pressured   Insight: fair   Judgment: fair   Appetite: increased   Energy: decreased     1/15 Assessment / Plan per NP Felecia Shelling:  Patient reports tolerating his medications well without side effects. He has selectively attended groups and he is hyperverbal as he shares his rationale behind doing so. He denies SI today and shares that his chronic AH have diminished in intensity. He currently feels safe in the hospital and he remains interested in rehab upon discharge when stable, potentially as early as  Wednesday--pending availability.      At this time, the patient would continue to benefit from inpatient psychiatric hospitalization for safety, stabilization, diagnostic clarification, medication management, and discharge planning.      1/16 Per Nursing note:  [name] has been restless with a blunted and intermittently anxious affect, cooperative with staff, and intermittently visible in the unit milieu this evening, though he kept mostly to himself with minimal interactions with his peers. He reports feeling "better and better day by day" and endorses an adequate energy level.   [name] continues to endorse religious-themed AH, stating that he hears the devil telling him to hurt/kill himself and Jehovah telling him to love himself, stating that he knows who's voice to listen to/which voice is looking out for him. He denies current SI/HI/VH and SIB urges and states that his depression and anxiety levels are not bothering him at this time.    [name] reports difficulties falling asleep due to the unit being "too cold", but voices finding his PM Ativan dose helpful for sleep quality overall. He denies having any concerns with his appetite. [name] initially endorses 10/10 severity bilateral joint pain in his knees and shoulders related to arthritis, which was brought down to 5/10 with rest and PRN medication prior to his falling asleep.    PRN Vistaril 100 mg (for sleep) and Ibuprofen 800 mg (for arthritic pain control)     1/16 Per Nursing note:  He was A+Ox4 but irritable, verbally aggressive, responding to internal stimuli, talking to himself and to other with loud voice about radial difference between back and white. This Probation officer acknowledge patient's frustration, invited him to the room but patient refused.   Irritable and angry mood.  [name] refused to rated his depression and anxiety.      1/16 Presenting Mental Status per Nursing:  Orientation Level: return to [facility]   Thought Content:  delusions;grandiose  Thought Process: perseverative   Behavior: restless;agitated   Impulse Control: impaired   Perception: hallucinations   Hallucinations: auditory   Eye Contact: return to [facility]   Attitude: blaming;hostile   Mood: unstable;irritable;anxious   Affect: angry;grandiose   Speech: hyperverbal;perseverative   Concentration: impaired   Insight: fair   Judgment: fair   Appearance: return to [facility]   Energy: increased     1/15-1/16 Scheduled and PRN Psych Meds:  Abilify 15mg  po daily-->20mg  po daily (1/13)  Ativan 1mg  po qHS  Vistaril 100mg  po qHS prn (rec'd 1 dose on 1/14 & 1 dose on 1/15)  Vistaril 25mg  po q4hr prn (rec'd 2 doses on 1/14, 1 dose on 1/15 & 1 dose on 1/16)     1/15-1/16 Treatment plan:  Activity as tolerated  Close monitoring q 54min checks  VS bid  Medication management and stabilization  Group/individual therapy  Therapeutic  Recreation eval and treat  D/c planning                Joneen Caraway, BSN, RN, [employer], ACM-RN          Utilization Review   [facility]  [address]  [place], [place] [address]  Phone: [phone] (VM)  [place]: [phone]  Fax: [phone]

## 2022-06-11 DIAGNOSIS — R52 Pain, unspecified: Secondary | ICD-10-CM | POA: Insufficient documentation

## 2022-06-11 DIAGNOSIS — F419 Anxiety disorder, unspecified: Secondary | ICD-10-CM | POA: Insufficient documentation

## 2022-06-11 DIAGNOSIS — G4700 Insomnia, unspecified: Secondary | ICD-10-CM | POA: Insufficient documentation

## 2022-06-11 MED ORDER — NYSTATIN 100000 UNIT/GM EX POWD
Freq: Two times a day (BID) | CUTANEOUS | Status: AC
Start: 2022-06-11 — End: ?

## 2022-06-11 MED ORDER — HYDROXYZINE PAMOATE 50 MG PO CAPS
50.0000 mg | ORAL_CAPSULE | ORAL | Status: AC | PRN
Start: 2022-06-11 — End: ?

## 2022-06-11 MED ORDER — HYDROXYZINE PAMOATE 100 MG PO CAPS
100.0000 mg | ORAL_CAPSULE | Freq: Every evening | ORAL | Status: AC
Start: 2022-06-11 — End: ?

## 2022-06-11 MED ORDER — IBUPROFEN 800 MG PO TABS
800.0000 mg | ORAL_TABLET | Freq: Three times a day (TID) | ORAL | Status: AC | PRN
Start: 2022-06-11 — End: ?

## 2022-06-11 MED ORDER — OLANZAPINE 10 MG PO TABS
10.0000 mg | ORAL_TABLET | Freq: Every evening | ORAL | Status: AC | PRN
Start: 2022-06-11 — End: ?

## 2022-06-11 MED ORDER — FOLIC ACID 1 MG PO TABS
1.0000 mg | ORAL_TABLET | Freq: Every day | ORAL | Status: AC
Start: 2022-06-12 — End: ?

## 2022-06-11 MED ORDER — TAB-A-VITE/BETA CAROTENE PO TABS
1.0000 | ORAL_TABLET | Freq: Every day | ORAL | Status: AC
Start: 2022-06-12 — End: ?

## 2022-06-11 NOTE — Plan of Care (Addendum)
Progress note:    [name] was A+Ox4 with labile affect and hyperverbal/ perservative speech. Hyperfocused on religion and racism. denies SI/HI/AVH/SIB urges. BVC is 0. Pt states they are committed to maintaining their safety, encouraged to come to staff if thoughts or urges to harm self or others increases, [name] verbalizes understanding. Pt is cooperative with Probation officer, able to answer questions and maintain  eye contact. Pt describes their mood as ready to go. adequate sleep/energy/appetite.  [name] rated their depression as 0 and anxiety as 5 (with 10 being the highest possible value). [name] is compliant with medication administration and mouth check completed. [name] was seen iwalking the halls and interacted with staff appropriately. Q15 min safety checks maintained. Will continue to monitor and ensure safety.  PRNs? Why? Tylenol       "I, [name], hereby attest that I have been made aware of and reviewed the following for this patient:    - Biopsychosocial assessment;  - Diagnoses;  - Nursing needs and medical support, if applicable; and  - Treatment Plan Goals, Objectives and Strategies/Interventions.    I am involved in the care of this patient within my scope of practice and assigned tasks. I approve the treatment plan and this attestation is in lieu of a signature on the document."    Care plan    Problem: At Risk for Suicide AS EVIDENCED BY...  Goal: Suicide Alert Level Moderate  Outcome: Progressing  Flowsheets (Taken 06/11/2022 0357)  Suicide Alert Level Moderate:   Every 15 minute checks   Reassess patient every shift using Suicide Assessment Tool (SAT)   Educate patient to ask for help  Goal: Verbalizes understanding of medication, benefits, and side effects  Outcome: Progressing  Flowsheets (Taken 06/11/2022 0357)  Verbalizes understanding of medication, benefits, and side effects:   Ensure Informed consent for all psychotropic medications   Provide medication teaching including name, dosage,  benefits, action, effect and side effects   Encourage to report response to medications including side effects     Problem: Thought Disorder  Goal: Conducts goal directed, coherent conversation  Outcome: Progressing  Flowsheets (Taken 06/11/2022 0357)  Conducts goal directed, coherent conversation:   Correct errors of perception in a matter of fact manner   Redirect conversations in a calm supportive manner   Assess patient's conversation every shift  Goal: Verbalizes reduction in hallucinations/delusions  Outcome: Progressing  Flowsheets (Taken 06/11/2022 0357)  Verbalizes reduction in hallucinations/delusions:   Monitor the presence of hallucinations/delusions every shift   Reality test with patient if patient able to tolerate

## 2022-06-11 NOTE — Progress Notes (Signed)
Roshard received discharge order.  Status upon discharge (describe: mental status, safety, etc.):   Jadis was A+Ox4 with flat affect and pressured loud speech. Denies SI/HI/AVH/SIB urges. Pt states they are committed to maintaining their safety, encouraged to come to staff if thoughts or urges to harm self or others increases, Teondre verbalizes understanding. Pt is cooperative with Probation officer, able to answer questions and maintain  eye contact. Pt describes their mood as depressed. Q 15 minutes monitoring maintained for safety.    Safety plan was  completed and a copy given to patient. (if not, explain reason)    After Visit Summary, AVS, including prescribed medications, were reviewed with patient and patient was able to verbalize understanding of follow-up plan.  Patient was given a copy of their AVS and Adult Wellness, Recovery & Safety Plan.    Benji was discharged to [facility]   Accompanied by alone     Mode of transportation: Taxi    Personal belongings were returned(if not, explain reason).

## 2022-06-11 NOTE — Nursing Progress Note (Signed)
[name]  62 y.o.  P631/P631.01  male  Active Hospital Problems    Diagnosis    Mania     Legal Status: Voluntary  Vitals:    06/10/22 2006   BP: 132/71   Pulse: 86   Resp: 16   Temp: 98.2 F (36.8 C)   SpO2: 97%     Blood Sugar Level: N/A  CIWA Score: n/a  COWS Score:n/a  Pain Score: 3/10  Pain Location: right leg and right knee  Falls Risk Score: - LOW  Falls Risk Reason: None  Affect: Agitated at times and grandiose  Mood irritable, labile, and unstable  Hallucinations: denies  Suicidal: No suicidal thoughts  Suicide Alert Level: Routine monitoring  Homicidal: No homicidal thoughts  Depression Level: 0/10  Anxiety Level: 4/10  Sleep Quality:Difficulty falling asleep  Sleep Hours:7  Energy: Energy level is good.  The patient is able to perform most usual functions.  Appetite: normal  NPO Status: n/a  ADLs: Independent with all self care tasks.  Hygiene: adequately groomed  Date of Last Bowel Movement: today  Number of Groups Attended: 0  Medication/Medication Compliance: accepted  Cheeking Medication: No    LAR/Meds Over Objection: No    Medication Side Effects: none  DVT Prophylaxis: None needed  Compliant with Blood draws/Labs: compliant  Compliant with Vitals: compliant  Elopement risk: No    Sexual aggression: No      Acute Events Overnight:

## 2022-06-11 NOTE — Plan of Care (Signed)
Treatment team met with PT this AM. Pt shared that he's doing well. He wants to stay from drugs. PT stated he feels ready to go to the residential program in [place] today. Sleep last night was good. Pleasant.     SW called  [phone]  [facility] in [place] residential program. Spoke with [facility]. Meds don't need to be electronically sent. They will go by the med list. Can accept PT today. Pt must be there before 2 PM.

## 2022-06-19 NOTE — [facility] Notes (Signed)
DISCHARGE CLINICAL    Admission date:05/27/2022  D/C date:  06/11/2022  Number of Days Authorized: 12      Reference number: [id number]   Psychiatric Diagnosis:  Bipolar 1 Disorder, most recent episode manic, severe, with psychotic features F31.2  PTSD F43.10   Polysubstance use disorder (alcohol, cocaine) F19.10   Rule out malingering     Medications at the time of discharge:   Abilify 20 mg PO daily   Vistaril 100 mg PO QHS   Vistaril 50 mg PO Q4H PRN   Zyprexa 10 mg PO QHS PRN     After care appointment:   RECOMMENDED THAT YOU COMPLY WITH THE FOLLOWING PLAN:   Follow up appointment with:     Admit to [facility] on Wednesday, 1/17  [place], [place] [address]   Uhland, RN, BSN   Utilization Review   [facility]   Phone: [phone]   Fax: [phone]
